# Patient Record
Sex: Male | Born: 1935 | Race: White | Hispanic: No | Marital: Married | Smoking: Never smoker
Health system: Southern US, Community
[De-identification: ages and names within clinical notes are randomized; demographics above are authoritative.]

## PROBLEM LIST (undated history)

## (undated) DIAGNOSIS — K900 Celiac disease: Secondary | ICD-10-CM

## (undated) DIAGNOSIS — S62102A Fracture of unspecified carpal bone, left wrist, initial encounter for closed fracture: Secondary | ICD-10-CM

## (undated) DIAGNOSIS — G4730 Sleep apnea, unspecified: Secondary | ICD-10-CM

## (undated) DIAGNOSIS — K317 Polyp of stomach and duodenum: Secondary | ICD-10-CM

## (undated) DIAGNOSIS — Z8701 Personal history of pneumonia (recurrent): Secondary | ICD-10-CM

## (undated) DIAGNOSIS — I4891 Unspecified atrial fibrillation: Secondary | ICD-10-CM

## (undated) DIAGNOSIS — E039 Hypothyroidism, unspecified: Secondary | ICD-10-CM

## (undated) DIAGNOSIS — J45909 Unspecified asthma, uncomplicated: Secondary | ICD-10-CM

## (undated) DIAGNOSIS — G4752 REM sleep behavior disorder: Secondary | ICD-10-CM

## (undated) DIAGNOSIS — E119 Type 2 diabetes mellitus without complications: Secondary | ICD-10-CM

## (undated) DIAGNOSIS — I10 Essential (primary) hypertension: Secondary | ICD-10-CM

## (undated) DIAGNOSIS — K5790 Diverticulosis of intestine, part unspecified, without perforation or abscess without bleeding: Secondary | ICD-10-CM

## (undated) DIAGNOSIS — G629 Polyneuropathy, unspecified: Secondary | ICD-10-CM

## (undated) DIAGNOSIS — N189 Chronic kidney disease, unspecified: Secondary | ICD-10-CM

## (undated) DIAGNOSIS — G2581 Restless legs syndrome: Secondary | ICD-10-CM

## (undated) DIAGNOSIS — C61 Malignant neoplasm of prostate: Secondary | ICD-10-CM

## (undated) DIAGNOSIS — I639 Cerebral infarction, unspecified: Secondary | ICD-10-CM

## (undated) DIAGNOSIS — I350 Nonrheumatic aortic (valve) stenosis: Secondary | ICD-10-CM

## (undated) DIAGNOSIS — C439 Malignant melanoma of skin, unspecified: Secondary | ICD-10-CM

## (undated) DIAGNOSIS — K219 Gastro-esophageal reflux disease without esophagitis: Secondary | ICD-10-CM

## (undated) DIAGNOSIS — R251 Tremor, unspecified: Secondary | ICD-10-CM

## (undated) DIAGNOSIS — L0390 Cellulitis, unspecified: Secondary | ICD-10-CM

## (undated) DIAGNOSIS — I480 Paroxysmal atrial fibrillation: Secondary | ICD-10-CM

## (undated) DIAGNOSIS — D509 Iron deficiency anemia, unspecified: Secondary | ICD-10-CM

## (undated) DIAGNOSIS — R252 Cramp and spasm: Secondary | ICD-10-CM

## (undated) HISTORY — DX: Type 2 diabetes mellitus without complications: E11.9

## (undated) HISTORY — DX: Hypothyroidism, unspecified: E03.9

## (undated) HISTORY — DX: Diverticulosis of intestine, part unspecified, without perforation or abscess without bleeding: K57.90

## (undated) HISTORY — DX: Polyneuropathy, unspecified: G62.9

## (undated) HISTORY — DX: REM sleep behavior disorder: G47.52

## (undated) HISTORY — PX: CARPAL TUNNEL RELEASE: SHX101

## (undated) HISTORY — DX: Unspecified atrial fibrillation: I48.91

## (undated) HISTORY — DX: Celiac disease: K90.0

## (undated) HISTORY — PX: KNEE SURGERY: SHX244

## (undated) HISTORY — DX: Cerebral infarction, unspecified: I63.9

## (undated) HISTORY — DX: Malignant melanoma of skin, unspecified: C43.9

## (undated) HISTORY — DX: Fracture of unspecified carpal bone, left wrist, initial encounter for closed fracture: S62.102A

## (undated) HISTORY — DX: Iron deficiency anemia, unspecified: D50.9

## (undated) HISTORY — PX: FOOT SURGERY: SHX648

## (undated) HISTORY — DX: Restless legs syndrome: G25.81

## (undated) HISTORY — DX: Sleep apnea, unspecified: G47.30

## (undated) HISTORY — DX: Personal history of pneumonia (recurrent): Z87.01

## (undated) HISTORY — DX: Tremor, unspecified: R25.1

## (undated) HISTORY — DX: Polyp of stomach and duodenum: K31.7

## (undated) HISTORY — DX: Paroxysmal atrial fibrillation: I48.0

## (undated) HISTORY — DX: Nonrheumatic aortic (valve) stenosis: I35.0

## (undated) HISTORY — DX: Essential (primary) hypertension: I10

## (undated) HISTORY — DX: Cramp and spasm: R25.2

## (undated) HISTORY — DX: Malignant neoplasm of prostate: C61

## (undated) HISTORY — DX: Gastro-esophageal reflux disease without esophagitis: K21.9

## (undated) HISTORY — DX: Cellulitis, unspecified: L03.90

## (undated) HISTORY — DX: Chronic kidney disease, unspecified: N18.9

## (undated) HISTORY — DX: Unspecified asthma, uncomplicated: J45.909

---

## 1976-10-13 HISTORY — PX: APPENDECTOMY: SHX54

## 1985-10-13 DIAGNOSIS — S62102A Fracture of unspecified carpal bone, left wrist, initial encounter for closed fracture: Secondary | ICD-10-CM

## 1985-10-13 HISTORY — DX: Fracture of unspecified carpal bone, left wrist, initial encounter for closed fracture: S62.102A

## 1992-10-13 HISTORY — PX: HERNIA REPAIR: SHX51

## 1998-03-13 ENCOUNTER — Encounter: Payer: Self-pay | Admitting: Internal Medicine

## 1998-03-13 ENCOUNTER — Ambulatory Visit (HOSPITAL_COMMUNITY): Admission: RE | Admit: 1998-03-13 | Discharge: 1998-03-13 | Payer: Self-pay | Admitting: Gastroenterology

## 1999-09-20 ENCOUNTER — Encounter: Payer: Self-pay | Admitting: Family Medicine

## 1999-09-20 ENCOUNTER — Encounter: Admission: RE | Admit: 1999-09-20 | Discharge: 1999-09-20 | Payer: Self-pay | Admitting: Family Medicine

## 1999-10-14 HISTORY — PX: CHOLECYSTECTOMY: SHX55

## 1999-10-14 HISTORY — PX: LUNG SURGERY: SHX703

## 1999-11-19 ENCOUNTER — Encounter (INDEPENDENT_AMBULATORY_CARE_PROVIDER_SITE_OTHER): Payer: Self-pay

## 1999-11-19 ENCOUNTER — Encounter: Payer: Self-pay | Admitting: Pulmonary Disease

## 1999-11-19 ENCOUNTER — Ambulatory Visit (HOSPITAL_COMMUNITY): Admission: RE | Admit: 1999-11-19 | Discharge: 1999-11-19 | Payer: Self-pay | Admitting: Pulmonary Disease

## 2000-01-16 ENCOUNTER — Encounter: Payer: Self-pay | Admitting: Thoracic Surgery

## 2000-01-17 ENCOUNTER — Encounter: Payer: Self-pay | Admitting: Thoracic Surgery

## 2000-01-17 ENCOUNTER — Inpatient Hospital Stay: Admission: RE | Admit: 2000-01-17 | Discharge: 2000-01-23 | Payer: Self-pay | Admitting: Thoracic Surgery

## 2000-01-17 ENCOUNTER — Encounter (INDEPENDENT_AMBULATORY_CARE_PROVIDER_SITE_OTHER): Payer: Self-pay | Admitting: Specialist

## 2000-01-18 ENCOUNTER — Encounter: Payer: Self-pay | Admitting: Thoracic Surgery

## 2000-01-19 ENCOUNTER — Encounter: Payer: Self-pay | Admitting: Thoracic Surgery

## 2000-01-20 ENCOUNTER — Encounter: Payer: Self-pay | Admitting: Thoracic Surgery

## 2000-01-21 ENCOUNTER — Encounter: Payer: Self-pay | Admitting: Thoracic Surgery

## 2000-01-22 ENCOUNTER — Encounter: Payer: Self-pay | Admitting: Thoracic Surgery

## 2000-02-05 ENCOUNTER — Encounter: Payer: Self-pay | Admitting: Thoracic Surgery

## 2000-02-05 ENCOUNTER — Encounter: Admission: RE | Admit: 2000-02-05 | Discharge: 2000-02-05 | Payer: Self-pay | Admitting: Thoracic Surgery

## 2000-03-11 ENCOUNTER — Encounter: Admission: RE | Admit: 2000-03-11 | Discharge: 2000-03-11 | Payer: Self-pay | Admitting: Thoracic Surgery

## 2000-03-11 ENCOUNTER — Encounter: Payer: Self-pay | Admitting: Thoracic Surgery

## 2000-05-12 ENCOUNTER — Encounter: Payer: Self-pay | Admitting: Thoracic Surgery

## 2000-05-12 ENCOUNTER — Encounter: Admission: RE | Admit: 2000-05-12 | Discharge: 2000-05-12 | Payer: Self-pay | Admitting: Thoracic Surgery

## 2000-07-15 LAB — PULMONARY FUNCTION TEST

## 2000-07-17 ENCOUNTER — Ambulatory Visit (HOSPITAL_COMMUNITY): Admission: RE | Admit: 2000-07-17 | Discharge: 2000-07-17 | Payer: Self-pay | Admitting: Pulmonary Disease

## 2000-07-17 ENCOUNTER — Encounter: Payer: Self-pay | Admitting: Pulmonary Disease

## 2000-07-22 ENCOUNTER — Encounter: Payer: Self-pay | Admitting: Thoracic Surgery

## 2000-07-22 ENCOUNTER — Encounter: Admission: RE | Admit: 2000-07-22 | Discharge: 2000-07-22 | Payer: Self-pay | Admitting: Thoracic Surgery

## 2000-10-13 HISTORY — PX: PROSTATECTOMY: SHX69

## 2000-11-19 ENCOUNTER — Ambulatory Visit (HOSPITAL_BASED_OUTPATIENT_CLINIC_OR_DEPARTMENT_OTHER): Admission: RE | Admit: 2000-11-19 | Discharge: 2000-11-19 | Payer: Self-pay | Admitting: Pulmonary Disease

## 2000-11-19 ENCOUNTER — Ambulatory Visit: Admission: RE | Admit: 2000-11-19 | Discharge: 2000-11-19 | Payer: Self-pay | Admitting: Pulmonary Disease

## 2000-11-19 LAB — PULMONARY FUNCTION TEST

## 2000-11-26 ENCOUNTER — Ambulatory Visit (HOSPITAL_COMMUNITY): Admission: RE | Admit: 2000-11-26 | Discharge: 2000-11-26 | Payer: Self-pay | Admitting: Pulmonary Disease

## 2000-11-26 ENCOUNTER — Encounter: Payer: Self-pay | Admitting: Pulmonary Disease

## 2001-04-29 ENCOUNTER — Other Ambulatory Visit: Admission: RE | Admit: 2001-04-29 | Discharge: 2001-04-29 | Payer: Self-pay | Admitting: Urology

## 2001-04-29 ENCOUNTER — Encounter (INDEPENDENT_AMBULATORY_CARE_PROVIDER_SITE_OTHER): Payer: Self-pay | Admitting: Specialist

## 2001-05-13 ENCOUNTER — Encounter: Payer: Self-pay | Admitting: Urology

## 2001-05-13 ENCOUNTER — Encounter: Admission: RE | Admit: 2001-05-13 | Discharge: 2001-05-13 | Payer: Self-pay | Admitting: Urology

## 2001-05-18 ENCOUNTER — Ambulatory Visit: Admission: RE | Admit: 2001-05-18 | Discharge: 2001-08-16 | Payer: Self-pay | Admitting: Radiation Oncology

## 2001-06-15 ENCOUNTER — Encounter: Payer: Self-pay | Admitting: Urology

## 2001-06-21 ENCOUNTER — Inpatient Hospital Stay (HOSPITAL_COMMUNITY): Admission: RE | Admit: 2001-06-21 | Discharge: 2001-06-24 | Payer: Self-pay | Admitting: Urology

## 2001-06-21 ENCOUNTER — Encounter (INDEPENDENT_AMBULATORY_CARE_PROVIDER_SITE_OTHER): Payer: Self-pay | Admitting: Specialist

## 2001-06-28 ENCOUNTER — Ambulatory Visit (HOSPITAL_COMMUNITY): Admission: RE | Admit: 2001-06-28 | Discharge: 2001-06-28 | Payer: Self-pay | Admitting: Urology

## 2001-08-23 ENCOUNTER — Ambulatory Visit (HOSPITAL_COMMUNITY): Admission: RE | Admit: 2001-08-23 | Discharge: 2001-08-23 | Payer: Self-pay | Admitting: Pulmonary Disease

## 2001-08-23 ENCOUNTER — Encounter: Payer: Self-pay | Admitting: Pulmonary Disease

## 2001-08-30 ENCOUNTER — Ambulatory Visit (HOSPITAL_BASED_OUTPATIENT_CLINIC_OR_DEPARTMENT_OTHER): Admission: RE | Admit: 2001-08-30 | Discharge: 2001-08-30 | Payer: Self-pay | Admitting: Urology

## 2001-09-21 ENCOUNTER — Ambulatory Visit (HOSPITAL_COMMUNITY): Admission: RE | Admit: 2001-09-21 | Discharge: 2001-09-21 | Payer: Self-pay | Admitting: Family Medicine

## 2002-04-26 ENCOUNTER — Encounter: Admission: RE | Admit: 2002-04-26 | Discharge: 2002-07-25 | Payer: Self-pay | Admitting: Family Medicine

## 2002-11-24 ENCOUNTER — Encounter: Payer: Self-pay | Admitting: Pulmonary Disease

## 2002-11-24 ENCOUNTER — Ambulatory Visit (HOSPITAL_COMMUNITY): Admission: RE | Admit: 2002-11-24 | Discharge: 2002-11-24 | Payer: Self-pay | Admitting: Pulmonary Disease

## 2002-12-28 ENCOUNTER — Encounter: Payer: Self-pay | Admitting: Family Medicine

## 2002-12-28 ENCOUNTER — Encounter: Admission: RE | Admit: 2002-12-28 | Discharge: 2002-12-28 | Payer: Self-pay | Admitting: Family Medicine

## 2003-05-12 ENCOUNTER — Encounter: Payer: Self-pay | Admitting: Family Medicine

## 2003-05-12 ENCOUNTER — Encounter: Admission: RE | Admit: 2003-05-12 | Discharge: 2003-05-12 | Payer: Self-pay | Admitting: Family Medicine

## 2003-07-10 ENCOUNTER — Ambulatory Visit: Admission: RE | Admit: 2003-07-10 | Discharge: 2003-07-10 | Payer: Self-pay | Admitting: Anesthesiology

## 2003-11-14 ENCOUNTER — Ambulatory Visit (HOSPITAL_COMMUNITY): Admission: RE | Admit: 2003-11-14 | Discharge: 2003-11-14 | Payer: Self-pay | Admitting: Pulmonary Disease

## 2004-07-15 LAB — PULMONARY FUNCTION TEST

## 2004-08-19 ENCOUNTER — Ambulatory Visit (HOSPITAL_COMMUNITY): Admission: RE | Admit: 2004-08-19 | Discharge: 2004-08-19 | Payer: Self-pay | Admitting: Orthopedic Surgery

## 2004-11-22 ENCOUNTER — Ambulatory Visit: Payer: Self-pay | Admitting: Pulmonary Disease

## 2005-05-26 ENCOUNTER — Ambulatory Visit: Payer: Self-pay | Admitting: Pulmonary Disease

## 2005-09-19 ENCOUNTER — Ambulatory Visit: Payer: Self-pay | Admitting: Pulmonary Disease

## 2006-03-16 ENCOUNTER — Ambulatory Visit: Payer: Self-pay | Admitting: Pulmonary Disease

## 2006-05-14 ENCOUNTER — Ambulatory Visit: Payer: Self-pay | Admitting: Pulmonary Disease

## 2006-09-09 ENCOUNTER — Ambulatory Visit: Payer: Self-pay | Admitting: Internal Medicine

## 2006-09-23 ENCOUNTER — Encounter: Payer: Self-pay | Admitting: Internal Medicine

## 2006-09-23 ENCOUNTER — Ambulatory Visit (HOSPITAL_COMMUNITY): Admission: RE | Admit: 2006-09-23 | Discharge: 2006-09-23 | Payer: Self-pay | Admitting: Internal Medicine

## 2006-10-01 ENCOUNTER — Ambulatory Visit: Payer: Self-pay | Admitting: Internal Medicine

## 2006-10-12 ENCOUNTER — Ambulatory Visit: Payer: Self-pay | Admitting: Internal Medicine

## 2006-12-17 ENCOUNTER — Ambulatory Visit: Payer: Self-pay | Admitting: Pulmonary Disease

## 2006-12-25 ENCOUNTER — Ambulatory Visit: Admission: RE | Admit: 2006-12-25 | Discharge: 2006-12-25 | Payer: Self-pay | Admitting: Pulmonary Disease

## 2006-12-25 ENCOUNTER — Ambulatory Visit: Payer: Self-pay | Admitting: Pulmonary Disease

## 2007-01-14 ENCOUNTER — Ambulatory Visit: Payer: Self-pay | Admitting: Pulmonary Disease

## 2007-02-04 ENCOUNTER — Ambulatory Visit: Payer: Self-pay | Admitting: Pulmonary Disease

## 2007-02-18 ENCOUNTER — Ambulatory Visit: Payer: Self-pay | Admitting: Pulmonary Disease

## 2007-05-17 ENCOUNTER — Ambulatory Visit: Payer: Self-pay | Admitting: Internal Medicine

## 2007-05-17 LAB — CONVERTED CEMR LAB
Albumin: 3.7 g/dL (ref 3.5–5.2)
Basophils Absolute: 0 10*3/uL (ref 0.0–0.1)
Eosinophils Absolute: 0.3 10*3/uL (ref 0.0–0.6)
Ferritin: 13.9 ng/mL — ABNORMAL LOW (ref 22.0–322.0)
Folate: >20 ng/mL
Iron: 103 ug/dL (ref 42–165)
MCHC: 34.6 g/dL (ref 30.0–36.0)
MCV: 95.3 fL (ref 78.0–100.0)
Platelets: 278 10*3/uL (ref 150–400)
RBC: 3.8 M/uL — ABNORMAL LOW (ref 4.22–5.81)
Saturation Ratios: 27.8 % (ref 20.0–50.0)
Tissue Transglutaminase Ab, IgA: 55 units — ABNORMAL HIGH (ref <7)
Transferrin: 264.5 mg/dL (ref >=212.0)
Vitamin B-12: 505 pg/mL (ref 211–911)

## 2007-08-31 ENCOUNTER — Ambulatory Visit: Payer: Self-pay | Admitting: Internal Medicine

## 2007-09-07 ENCOUNTER — Encounter: Payer: Self-pay | Admitting: Internal Medicine

## 2007-09-07 ENCOUNTER — Ambulatory Visit: Payer: Self-pay | Admitting: Internal Medicine

## 2007-11-29 DIAGNOSIS — K219 Gastro-esophageal reflux disease without esophagitis: Secondary | ICD-10-CM | POA: Insufficient documentation

## 2007-11-29 DIAGNOSIS — K900 Celiac disease: Secondary | ICD-10-CM

## 2008-03-14 ENCOUNTER — Encounter: Payer: Self-pay | Admitting: Internal Medicine

## 2008-03-24 ENCOUNTER — Ambulatory Visit: Payer: Self-pay | Admitting: Internal Medicine

## 2008-04-04 ENCOUNTER — Encounter: Payer: Self-pay | Admitting: Internal Medicine

## 2008-04-27 ENCOUNTER — Encounter: Payer: Self-pay | Admitting: Internal Medicine

## 2008-04-27 ENCOUNTER — Ambulatory Visit: Payer: Self-pay | Admitting: Internal Medicine

## 2008-04-27 HISTORY — PX: COLONOSCOPY W/ BIOPSIES: SHX1374

## 2008-04-27 HISTORY — PX: UPPER GASTROINTESTINAL ENDOSCOPY: SHX188

## 2008-05-04 ENCOUNTER — Encounter: Payer: Self-pay | Admitting: Internal Medicine

## 2008-11-13 ENCOUNTER — Encounter: Payer: Self-pay | Admitting: Internal Medicine

## 2009-01-30 ENCOUNTER — Inpatient Hospital Stay (HOSPITAL_COMMUNITY): Admission: EM | Admit: 2009-01-30 | Discharge: 2009-02-02 | Payer: Self-pay | Admitting: Emergency Medicine

## 2009-02-09 ENCOUNTER — Ambulatory Visit (HOSPITAL_COMMUNITY): Admission: RE | Admit: 2009-02-09 | Discharge: 2009-02-09 | Payer: Self-pay | Admitting: Internal Medicine

## 2009-02-28 ENCOUNTER — Encounter (HOSPITAL_COMMUNITY): Admission: RE | Admit: 2009-02-28 | Discharge: 2009-03-30 | Payer: Self-pay | Admitting: Internal Medicine

## 2009-02-28 ENCOUNTER — Ambulatory Visit (HOSPITAL_COMMUNITY): Payer: Self-pay | Admitting: Internal Medicine

## 2009-06-01 ENCOUNTER — Encounter: Payer: Self-pay | Admitting: Internal Medicine

## 2009-09-20 ENCOUNTER — Ambulatory Visit: Payer: Self-pay | Admitting: Internal Medicine

## 2009-09-20 DIAGNOSIS — E669 Obesity, unspecified: Secondary | ICD-10-CM

## 2009-09-24 LAB — CONVERTED CEMR LAB: Tissue Transglutaminase Ab, IgA: 5.6 units (ref <7)

## 2009-12-13 ENCOUNTER — Encounter: Payer: Self-pay | Admitting: Internal Medicine

## 2009-12-13 ENCOUNTER — Ambulatory Visit: Payer: Self-pay | Admitting: Internal Medicine

## 2009-12-21 ENCOUNTER — Encounter: Payer: Self-pay | Admitting: Internal Medicine

## 2010-01-10 ENCOUNTER — Telehealth: Payer: Self-pay | Admitting: Internal Medicine

## 2010-02-15 ENCOUNTER — Encounter: Payer: Self-pay | Admitting: Internal Medicine

## 2010-04-29 ENCOUNTER — Telehealth: Payer: Self-pay | Admitting: Internal Medicine

## 2010-05-08 ENCOUNTER — Ambulatory Visit: Payer: Self-pay | Admitting: Internal Medicine

## 2010-05-08 LAB — CONVERTED CEMR LAB
ALT: 29 units/L (ref 0–53)
Basophils Absolute: 0 10*3/uL (ref 0.0–0.1)
CO2: 29 meq/L (ref 19–32)
Creatinine, Ser: 1 mg/dL (ref 0.4–1.5)
GFR calc non Af Amer: 74.86 mL/min (ref >60)
HCT: 34.8 % — ABNORMAL LOW (ref 39.0–52.0)
Hemoglobin: 12 g/dL — ABNORMAL LOW (ref 13.0–17.0)
Lymphs Abs: 1.5 10*3/uL (ref 0.7–4.0)
MCHC: 34.5 g/dL (ref 30.0–36.0)
Monocytes Relative: 11.9 % (ref 3.0–12.0)
Neutro Abs: 6.2 10*3/uL (ref 1.4–7.7)
RDW: 13.7 % (ref 11.5–14.6)
Total Bilirubin: 0.7 mg/dL (ref 0.3–1.2)

## 2010-07-12 ENCOUNTER — Encounter: Payer: Self-pay | Admitting: Internal Medicine

## 2010-08-13 ENCOUNTER — Ambulatory Visit: Payer: Self-pay | Admitting: Internal Medicine

## 2010-08-13 LAB — CONVERTED CEMR LAB: Tissue Transglutaminase Ab, IgA: 31.2 units — ABNORMAL HIGH (ref <20)

## 2010-09-12 ENCOUNTER — Ambulatory Visit: Payer: Self-pay | Admitting: Internal Medicine

## 2010-09-12 LAB — CONVERTED CEMR LAB: Tissue Transglutaminase Ab, IgA: 32.3 units — ABNORMAL HIGH (ref <20)

## 2010-09-15 ENCOUNTER — Telehealth: Payer: Self-pay | Admitting: Internal Medicine

## 2010-11-12 NOTE — Progress Notes (Signed)
Summary: celiac abs remain +   Phone Note Outgoing Call   Summary of Call: celiac antibodies remain positive unless he is having problems, would not do anything different at this point to the best of my knowledge this means he is ingesting gluten or has refractory disease keep annual f/u let's make sure he is not having any GI sxs cc the note and labs to his PCP Dr. Siri Cole MD, [facility]  September 15, 2010 1:18 PM   Follow-up for Phone Call        LM to [facility] at home number [place] Deborra Medina)  September 17, 2010 10:32 AM   RC from pt at 11:40am-left message on VM.  RC to pt.  I advised him of the above and he is agreeable with that plan.  I told him we would contact him in the summer to come in for yearly follow up.  Pt voices understanding and is agreeable. Follow-up by: Abelino Derrick CMA Deborra Medina),  September 19, 2010 12:39 PM

## 2010-11-12 NOTE — Progress Notes (Signed)
Summary: macrocytosis  Phone Note Outgoing Call   Summary of Call: please contact patient MCV 102 on latest CBC at Dr. Juel Burrow office could be a sign of B12 deficiency last B12 level here ok in 2009 if he has not had one in last 6 months would repeat it - he may need to check with Dr. Nevada Crane - cc Dr. Wende Neighbors also Gatha Mayer MD, [facility]  January 10, 2010 6:11 PM   Follow-up for Phone Call        [facility] from pt.  He does not think he has a B12 level lately.  We will call Dr. Juel Burrow office to see if they have drawn in last 6 months. I spoke with Thayer Headings and she will check in Mr. Ronchetti chart and return my call.  Follow-up by: Abelino Derrick CMA Deborra Medina),  January 23, 2010 9:15 AM  Additional Follow-up for Phone Call Additional follow up Details #1::        Left message for patient to call back, patient will need b12 level Barb Merino RN, [facility]  January 24, 2010 11:40 AM  RC from pt.  Notified that he does need B12 level.  Order faxed to [facility] @ Dr. Juel Burrow office.  Pt will call his office to schedule for B12 level. Additional Follow-up by: Abelino Derrick CMA Deborra Medina),  January 30, 2010 9:55 AM  New Problems: MACROCYTIC ANEMIA (ICD-281.9)   New Problems: MACROCYTIC ANEMIA (ICD-281.9)

## 2010-11-12 NOTE — Assessment & Plan Note (Signed)
Summary: pale stools/sheri    History of Present Illness Visit Type: Follow-up Visit Primary GI MD: Silvano Rusk MD [facility] Primary Provider: Wende Neighbors, MD Requesting Provider: n/a Chief Complaint: Celiac disease, pale stools x 6 weeks History of Present Illness:   When stools are formed they are grayish white. When loose it is brown and he is having more loose stools now. He had terrible constipation a few months ago, Dr. Nevada Crane ?ed due to oxybutinin and constipation improved after stopping that. Urinary leakage returned. Change in stool color started 6 weeks ago. He has not changed medications, supplements or diet. No barium ingestion.  Says "doing pretty good" with gluten-free. Blood sugars have been fluctuationg with highest at 150, lowest in 70's. He lost 11# with intent.   GI Review of Systems    Reports bloating.      Denies abdominal pain, acid reflux, belching, chest pain, dysphagia with liquids, dysphagia with solids, heartburn, loss of appetite, nausea, vomiting, vomiting blood, weight loss, and  weight gain.      Reports change in bowel habits, constipation, diarrhea, and  light color stool.     Denies anal fissure, black tarry stools, diverticulosis, fecal incontinence, heme positive stool, hemorrhoids, irritable bowel syndrome, jaundice, liver problems, rectal bleeding, and  rectal pain.    Current Medications (verified): 1)  Aspirin Adult Low Strength 81 Mg  Tbec (Aspirin) .... Take 1 Tablet By Mouth Once A Day 2)  Multivitamins   Tabs (Multiple Vitamin) .... Take 1 Tablet By Mouth Once A Day 3)  Amitriptyline Hcl 75 Mg Tabs (Amitriptyline Hcl) .Marland Kitchen.. 1 By Mouth Once Daily 4)  Synthroid 125 Mcg Tabs (Levothyroxine Sodium) .Marland Kitchen.. 1 By Mouth Once Daily 5)  Benicar 20 Mg Tabs (Olmesartan Medoxomil) .... Take 1/2 Tablet By Mouth Once Daily 6)  Metoprolol Tartrate 50 Mg Tabs (Metoprolol Tartrate) .Marland Kitchen.. 1 By Mouth Once Daily 7)  Oxybutynin Chloride 15 Mg Xr24h-Tab (Oxybutynin  Chloride) .Marland Kitchen.. 1 By Mouth Once Daily 8)  Niaspan 1000 Mg Cr-Tabs (Niacin (Antihyperlipidemic)) .Marland Kitchen.. 1 By Mouth Once Daily 9)  Divista 1 Mg Caps (Fa-B6-B12-Omega 3-Biotin-Cr) .Marland Kitchen.. 1 By Mouth Once Daily 10)  Alpha-Lipoic Acid 300 Mg Caps (Alpha-Lipoic Acid) .Marland Kitchen.. 1 By Mouth Once Daily 11)  Vitamin D3 1000 Unit Caps (Cholecalciferol) .Marland Kitchen.. 1 By Mouth Two Times A Day 12)  Melatonin 3 Mg Tabs (Melatonin) .Marland Kitchen.. 1 By Mouth Once Daily 13)  Coq10 200 Mg Caps (Coenzyme Q10) .Marland Kitchen.. 1 By Mouth Once Daily 14)  Magnesium 500 Mg Tabs (Magnesium) .Marland Kitchen.. 1 By Mouth Once Daily 15)  Oscal 500/200 D-3 500-200 Mg-Unit Tabs (Calcium-Vitamin D) .... Three Times A Day 16)  Vitamin C 1000 Mg Tabs (Ascorbic Acid) .... Two Times A Day  Allergies (verified): 1)  ! Keflex 2)  Sulfa 3)  Pcn 4)  Novocain  Past History:  Past Medical History: Reviewed history from 09/20/2009 and no changes required. Celiac disease Iron deficiency anemia, recurrent GERD REM Sleep Behavior Disorder Hypothyroidism Restless leg syndrome Pneumonia with pleural effusion Asthmatic bronchitis Melanoma left UE Cellulitis ([facility] hospitalization)  Past Surgical History: Reviewed history from 04/27/2008 and no changes required. Appendectomy Cholecystectomy Prostatectomy for prostate cancer foot surgery bilateral hernia repair lung surgery knee surgery x 2 carpal tunnel  Family History: Reviewed history from 09/20/2009 and no changes required. Family History of Breast Cancer: Mother No FH of Colon Cancer:  Social History: Reviewed history from 03/24/2008 and no changes required. 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  Alcohol Use - no Illicit Drug Use - yes Patient gets regular exercise.  Vital Signs:  Patient profile:   77 year old male Height:      70 inches Weight:      224.38 pounds BMI:     32.31 Pulse rate:   68 / minute Pulse rhythm:   regular BP sitting:   142 / 60  (left arm) Cuff  size:   regular  Vitals Entered By: June McMurray [place] Deborra Medina) (May 08, 2010 9:53 AM)  Physical Exam  General:  obese.  NAD Eyes:  anicteric Abdomen:  obese, soft and nontender ventral hernia midline  Rectal:  no mass soft and tan-yellow colered stool   Impression & Recommendations:  Problem # 1:  CELIAC DISEASE (ICD-579.0) Assessment Unchanged  Orders: T-Sprue Panel (Celiac Disease Aby Eval) (83516x3/[address]) TLB-CBC Platelet - w/Differential (85025-CBCD) TLB-CMP (Comprehensive Metabolic Pnl) (0000000)  Problem # 2:  NONSPECIFIC ABNORMAL FINDING IN STOOL CONTENTS (ICD-792.1) change in color, etiology not clear at all and no worrisome features ? related to celiac disease start with labs not sure what next step would be if they are unhelpful and explained to him he is concerned about malignancy  Patient Instructions: 1)  Please go to the basement to have your lab tests drawn today.  2)  We will call you with further follow up after reviewing these results.  3)  Copy sent to : Wende Neighbors, MD 4)  The medication list was reviewed and reconciled.  All changed / newly prescribed medications were explained.  A complete medication list was provided to the patient / caregiver.

## 2010-11-12 NOTE — Progress Notes (Signed)
Summary: White stool.   Phone Note Call from Patient Call back at Home Phone [phone]   Caller: Spouse Call For: [name] Summary of Call: Patient is having white stool for about a month he is also complaining of some bloatingbut denies any other symptoms would like a sooner appt than september. Initial call taken by: Bernita Buffy CMA ([place]),  April 29, 2010 9:15 AM  Follow-up for Phone Call        1 month hx of pale/white stools.  patient reports started after he stopped taking his pain meds.  he does report some gas/bloating.  Specifically denies fever, abdominal pain, nausea or vomiting, diarrhea.  Patient  is given an appointment for 05/08/10 9:45.  Dr [name] does he need any lab work prior to this appointment?? Follow-up by: Barb Merino RN, [place],  April 29, 2010 9:27 AM  Additional Follow-up for Phone Call Additional follow up Details #1::        no - has he been using aluminum hydroxide antacids, antibiotics, had barium? Additional Follow-up by: Gatha Mayer MD, [facility],  April 29, 2010 5:07 PM    Additional Follow-up for Phone Call Additional follow up Details #2::    no antacids of any kind or recent barium studies.  he will keep his appointment for next week. Follow-up by: Barb Merino RN, [place],  April 30, 2010 10:52 AM

## 2010-11-12 NOTE — Miscellaneous (Signed)
Summary: BONE DENSITY  Clinical Lists Changes  Orders: Added new Test order of T-Bone Densitometry (77080) - Signed Added new Test order of T-Lumbar Vertebral Assessment (77082) - Signed 

## 2011-01-21 LAB — CBC
MCV: 96.4 fL (ref 78.0–100.0)
RBC: 3.35 MIL/uL — ABNORMAL LOW (ref 4.22–5.81)
WBC: 6.8 10*3/uL (ref 4.0–10.5)

## 2011-01-21 LAB — LIPID PANEL
HDL: 23 mg/dL — ABNORMAL LOW (ref >39)
Triglycerides: 296 mg/dL — ABNORMAL HIGH (ref <150)
VLDL: 59 mg/dL — ABNORMAL HIGH (ref 0–40)

## 2011-01-21 LAB — COMPREHENSIVE METABOLIC PANEL
ALT: 21 U/L (ref 0–53)
AST: 24 U/L (ref 0–37)
CO2: 26 mEq/L (ref 19–32)
Chloride: 107 mEq/L (ref 96–112)
GFR calc Af Amer: >60 mL/min (ref >60)
GFR calc non Af Amer: >60 mL/min (ref >60)
Potassium: 4.1 mEq/L (ref 3.5–5.1)
Sodium: 138 mEq/L (ref 135–145)
Total Bilirubin: 0.4 mg/dL (ref 0.3–1.2)

## 2011-01-22 LAB — GLUCOSE, CAPILLARY
Glucose-Capillary: 102 mg/dL — ABNORMAL HIGH (ref 70–99)
Glucose-Capillary: 111 mg/dL — ABNORMAL HIGH (ref 70–99)
Glucose-Capillary: 118 mg/dL — ABNORMAL HIGH (ref 70–99)
Glucose-Capillary: 152 mg/dL — ABNORMAL HIGH (ref 70–99)
Glucose-Capillary: 72 mg/dL (ref 70–99)
Glucose-Capillary: 90 mg/dL (ref 70–99)
Glucose-Capillary: 91 mg/dL (ref 70–99)
Glucose-Capillary: 99 mg/dL (ref 70–99)

## 2011-01-22 LAB — BASIC METABOLIC PANEL
BUN: 13 mg/dL (ref 6–23)
BUN: 17 mg/dL (ref 6–23)
CO2: 22 mEq/L (ref 19–32)
CO2: 24 mEq/L (ref 19–32)
Calcium: 8.7 mg/dL (ref 8.4–10.5)
Calcium: 9.3 mg/dL (ref 8.4–10.5)
Chloride: 113 mEq/L — ABNORMAL HIGH (ref 96–112)
Creatinine, Ser: 0.98 mg/dL (ref 0.4–1.5)
Creatinine, Ser: 1.06 mg/dL (ref 0.4–1.5)
GFR calc Af Amer: >60 mL/min (ref >60)
GFR calc Af Amer: >60 mL/min (ref >60)
GFR calc Af Amer: >60 mL/min (ref >60)
GFR calc non Af Amer: >60 mL/min (ref >60)
GFR calc non Af Amer: >60 mL/min (ref >60)
GFR calc non Af Amer: >60 mL/min (ref >60)
Glucose, Bld: 117 mg/dL — ABNORMAL HIGH (ref 70–99)
Potassium: 4 mEq/L (ref 3.5–5.1)
Potassium: 4.1 mEq/L (ref 3.5–5.1)
Sodium: 139 mEq/L (ref 135–145)
Sodium: 139 mEq/L (ref 135–145)

## 2011-01-22 LAB — DIFFERENTIAL
Basophils Absolute: 0 10*3/uL (ref 0.0–0.1)
Basophils Relative: 0 % (ref 0–1)
Basophils Relative: 1 % (ref 0–1)
Eosinophils Absolute: 0.2 10*3/uL (ref 0.0–0.7)
Eosinophils Relative: 2 % (ref 0–5)
Lymphs Abs: 1.6 10*3/uL (ref 0.7–4.0)
Monocytes Absolute: 1.2 10*3/uL — ABNORMAL HIGH (ref 0.1–1.0)
Monocytes Relative: 11 % (ref 3–12)
Monocytes Relative: 9 % (ref 3–12)
Neutro Abs: 10.9 10*3/uL — ABNORMAL HIGH (ref 1.7–7.7)
Neutro Abs: 7.9 10*3/uL — ABNORMAL HIGH (ref 1.7–7.7)
Neutrophils Relative %: 73 % (ref 43–77)
Neutrophils Relative %: 78 % — ABNORMAL HIGH (ref 43–77)

## 2011-01-22 LAB — CBC
Hemoglobin: 11.3 g/dL — ABNORMAL LOW (ref 13.0–17.0)
MCHC: 34.6 g/dL (ref 30.0–36.0)
MCHC: 34.7 g/dL (ref 30.0–36.0)
Platelets: 282 10*3/uL (ref 150–400)
Platelets: 324 10*3/uL (ref 150–400)
RBC: 3.67 MIL/uL — ABNORMAL LOW (ref 4.22–5.81)
RDW: 13.2 % (ref 11.5–15.5)
WBC: 13.9 10*3/uL — ABNORMAL HIGH (ref 4.0–10.5)

## 2011-01-22 LAB — VANCOMYCIN, TROUGH: Vancomycin Tr: 14.9 ug/mL (ref 10.0–20.0)

## 2011-02-25 NOTE — Discharge Summary (Signed)
[name], [name]               ACCOUNT NO.:  [id number]   MEDICAL RECORD NO.:  [id number]          PATIENT TYPE:  INP   LOCATION:  [id number]                          FACILITY:  [facility]   PHYSICIAN:  [name], M.D.        DATE OF BIRTH:  May 14, 1929   DATE OF ADMISSION:  01/30/2009  DATE OF DISCHARGE:  04/23/2010LH                               DISCHARGE SUMMARY   DISCHARGE DIAGNOSES:  1. Right leg cellulitis/thrombophlebitis.  2. Diabetes mellitus type 2, diet controlled.  3. Insomnia.   DISCHARGE MEDICATIONS:  1. Toprol-XL 50 mg once daily.  2. Aspirin 81 mg p.o. daily.  3. Multivitamin once daily.  4. Oxybutynin extended release 10 mg daily.  5. Amitriptyline 75 mg p.o. nightly.  6. Levaquin 750 mg p.o. daily x6 more days.  7. Vancomycin 1000 mg q.12 h. x7 more days.  8. Tylenol 650 mg q.6 h. p.r.n. for fever and pain.  9. Ibuprofen 600 mg q.6 h. p.r.n. for fever pain.   BRIEF HISTORY OF PRESENT ILLNESS:  Mr. Kuo is a 77 year old gentleman  who had been seen several days previously and treated for right leg  cellulitis, but continue to have pain and weeping from this area and  came in for IV antibiotics.  He initially had a fever 102, treated as  outpatient, but had been afebrile for approximately 5 days prior to  admission, was admitted for IV antibiotics   Laboratory data obtained during hospitalization.  Initial CBC showed  white count 13.9, hemoglobin of 12.4, platelet count of 324.  BMET on  admission shows sodium 136, potassium 4.2, chloride 102, CO2 24, glucose  107, BUN 13, creatinine 0.98, calcium 9.3.  BMET at the time of  discharge showed sodium 141, potassium 4.1, chloride 113, CO2 24,  glucose 113, BUN 15, creatinine of 0.99, and calcium of 8.2.  Chest x-  ray obtained showed just for PICC line placement showed left PICC in  good position, no pneumothorax, no acute disease.   Procedure during the hospital, left PICC line placed.   HOSPITAL COURSE:  1. Right  leg cellulitis and thrombophlebitis.  Initially, the patient      had some serosanguineous type weeping from front of his leg was      very large but every day continued to decrease in size, erythema      also improved, he is very sensitive throughout hospitalization as      far as pain wise and was initially started on Levaquin and      vancomycin to cover both broad-spectrum as well as strep and staph.      This resolved, but the patient needed full 10-day course of      medications and IV wise was sent out with a left PICC line to be      taken care of by home health and will follow up with me in      approximately 7 days after discharge.  He is to use Tylenol and ibuprofen for pain and I do not require any  further stronger pain medicine than  that.  He is encouraged to keep his  leg elevated as much as possible when lying down, but encouraged to  ambulate as much as feels he can.  1. Diabetes mellitus type 2, diet controlled.  His blood sugars did      range just a little bit to the 120s range, but overall state with      in the acceptable range as A1c has been very low as an outpatient.      We will continue monitor as outpatient.  2. Insomnia.  He is to continue on his amitriptyline as previously      prescribed work well for him.   DISPOSITION:  The patient will be seen approximately 1 week for followup  on this right leg.  I will see if in fact needs any further antibiotics  at that time and if not necessary, then we will pull the PICC line.       [name], M.D.  Electronically Signed     ZH/MEDQ  D:  02/07/2009  T:  02/08/2009  Job:  [id number]

## 2011-02-25 NOTE — Assessment & Plan Note (Signed)
[facility]                         GASTROENTEROLOGY OFFICE NOTE   NAME:DECKERKymier, Cleverly                      MRN:          [id number]  DATE:08/31/2007                            DOB:          Dec 14, 1928    CHIEF COMPLAINT:  Followup of celiac disease.   Mr. Pelino had positive celiac antibodies in August.  He had been eating  a bread that 85% of celiac patients could eat that had some type of  old type of wheat in it.  He has eliminated that for two months and  thinks that may have been the source.  He feels well, without any  diarrhea problems.  Dr. Gaynelle Arabian has been supplementing him with  vitamin D 10-1998 units a day after he was found to have a low vitamin D  level.  He tells me he had a DEXA scan a few years ago, after one of the  drug store type health screens had told him he had osteopenia or  osteoporosis in his foot, and was started on Fosamax, but subsequently  the DEXA was normal.  He has never had a repeat.  He brings a list, or  sheet, from the Celi[employer] that talks about followup care for adults  from Dr. Nelwyn Salisbury, an international celiac expert.  We followed  through with that.  He is up to date on his Pneumovax.  He recently had  a physical with Dr. Nevada Crane.  He has had an influenza vaccine.  A ferritin  level and a whole bunch of labs were to be checked by Dr. Nevada Crane and Mr.  Alkins tells me Dr. Nevada Crane will send me those labs.  That is his new  primary care physician.  That is his new primary care physician in  [place].  He had had an iron infusion, as well, since I had last seen  him or talked to him.   PHYSICAL:  Weight 222 pounds, pulse 80, blood pressure 130/58.  EYES:  Anicteric.  NECK:  Supple.  CHEST:  Clear.  HEART:  S1, S2, no murmurs or gallops.  ABDOMEN:  Obese, soft.  There is an upper abdominal ventral hernia that  is easily reducible.  EXTREMITIES:  There is 1+ bilateral lower extremity edema.   MEDICATIONS:   His medications are listed and reviewed in the chart.  He  tells me, as far as he knows, all of his medications are gluten-free, as  well as his supplements.   PAST MEDICAL HISTORY:  Reviewed and unchanged from previous dictation.   ASSESSMENT:  Celiac disease with presumed gluten ingestion in the past  with abnormal celiac antibodies.  He has withdrawn one possible  offending agent.   PLAN:  1. Recheck antibodies.  2. DEXA scan for bone density issues, for celiac disease, osteopenia      and low vitamin D level.  3. He will ask Dr. Gaynelle Arabian to send me the vitamin D levels.  He      might need 50,000-unit weekly dosing for six to eight weeks,      depending upon the dose.  4. Await  labs from Dr. Nevada Crane.  5. Further plans pending this.     Gatha Mayer, MD,FACG  Electronically Signed    CEG/MedQ  DD: 08/31/2007  DT: 08/31/2007  Job #: [id number]   cc:   [name], M.D.  Sigmund I. Gaynelle Arabian, M.D.

## 2011-02-25 NOTE — Assessment & Plan Note (Signed)
[facility]                             PULMONARY OFFICE NOTE   NAME:DECKERKobee, Nong                      MRN:          [id number]  DATE:02/18/2007                            DOB:          07-30-1929    I saw Mr. Boughton today in followup for his REM sleep behavior disorder.   He has been on Rozerem 8 mg q.h.s.  He takes his at about 10 o'clock at  night and goes to sleep about an hour after that.  He has not had any  difficulty taking the medication.  He has also stopped his ReQuip and  Gingko.  He says that he has not had any recurrence of his dream-acting  behavior.  His main complaint right now is that he gets stiffness in his  back when he is sleeping in bed and as a result sometimes has to sleep  in his recliner.  Apparently he has been sleeping on 3 pillows to help  with his reflux symptoms.  I discussed with him that it would be best to  actually elevate the head of his bed to help with reflux symptoms and  that the position that he is sleeping in right now might actually be  exacerbating his symptoms as well as contributing to his back  complaints.  I have also refilled his prescription for Nexium 40 mg  daily.  Otherwise, I would continue him on Rozerem and I have  reemphasized to him the need to have a safe sleep environment.   I will follow up with him in approximately 2-3 months.     Chesley Mires, MD  Electronically Signed    VS/MedQ  DD: 02/18/2007  DT: 02/18/2007  Job #: [id number]

## 2011-02-25 NOTE — Group Therapy Note (Signed)
NAME:  RALIEGH, KARPF NO.:  [id number]   MEDICAL RECORD NO.:  [id number]          PATIENT TYPE:  INP   LOCATION:  [id number]                          FACILITY:  [facility]   PHYSICIAN:  [name], M.D.        DATE OF BIRTH:  1929/02/24   DATE OF PROCEDURE:  02/01/2009  DATE OF DISCHARGE:                                 PROGRESS NOTE   SUBJECTIVE:  Mr. Faz is a 77 year old gentleman who presented with a  several day history of right leg cellulitis and pain.  Overall leg still  has a significant amount of pain, not as much swelling, definitely not  as much pain up in the right thigh area as before.  Has not had any  fever, eating and drinking well with no other complaints.   OBJECTIVE:  VITAL SIGNS:  Temperature is 97.5, blood pressure 146/65,  pulse 76, respirations 16, satting 97% on room air.  GENERAL:  This is an elderly gentleman lying in bed in no acute  distress.  HEENT:  Unremarkable.  LUNGS:  Clear to auscultation bilaterally.  HEART:  Regular rate and rhythm.  ABDOMEN:  Soft, nontender, positive bowel sounds.  EXTREMITIES:  Right leg still elevated, still with erythema to the mid  calf area, still very painful to touch, and does not have as much pain  up in the right thigh area.  No new serosanguineous type drainage noted.  NEUROLOGIC:  The patient is alert and oriented x3, no deficits noted.   LABORATORY DATA:  Vancomycin trough was 14.9.  BMET shows sodium 139,  potassium 4.0, chloride 111, CO2 24, glucose 116, BUN 17, creatinine  0.99, calcium of 8.4.   IMPRESSION:  A 77 year old with cellulitis/thrombophlebitis the right  leg.   ASSESSMENT/PLAN:  1. Right leg cellulitis thrombophlebitis.  Continue on Levaquin and      vancomycin IV for now.  May be able to transition to Levaquin p.o.      but given the slow progression of his right leg, feel that he would      benefit from have another week of vancomycin, thus we will get a      PICC line placed.  The  patient was made aware of this and that may      be able to go home with PICC line as well as getting routine IV      antibiotics 1000 mg q.12 h. at home for another 7 days.  2. Diabetes mellitus type 2, diet-controlled.  Sugars are doing well.      No significant elevation.  3. GI and DVT prophylaxis continued.  The patient is able to ambulate      and get up out of bed with no assistance and would be able to go      home with his condition.   DISPOSITION:  The patient will have a PICC line placed and if continues  stable, will send home with PICC line with IV antibiotic therapy.      [name], M.D.  Electronically Signed     ZH/MEDQ  D:  02/01/2009  T:  02/01/2009  Job:  [id number]

## 2011-02-25 NOTE — Assessment & Plan Note (Signed)
[name]                             PULMONARY OFFICE NOTE   NAME:[name], [name]                      MRN:          [id number]  DATE:02/04/2007                            DOB:          30-Apr-1929    FOLLOWUP NOTE   This is a 77 year old gentleman who has been followed here previously  for obstructive apnea, which resolved after weight loss.  Subsequently  developed with what appeared to be restless legs versus periodic  movement disorder of sleep.  The patient has been treated for this with  mixed results.  He has been having continued difficulties and  subsequently underwent a sleep study at [facility], which  showed the patient had bruxism and also had findings consistent that he  may have REM-associated sleep disorder.  In general, I explained to the  patient that his issues are more that he tends to act out his dreams.  He normally does not have issues with leg movement unless he has a  recurrent dream of being chased.   CURRENT MEDICATIONS:  As noted on the intake sheet.  These have been  reviewed and are accurate.   PHYSICAL EXAM:  VITAL SIGNS:  As noted.  Oxygen saturation is 97% on  room air.  GENERAL:  This is a well-developed, well-nourished gentleman who is in  no acute distress.  HEENT:  Examination is unremarkable.  NECK:  Supple.  No adenopathy noted.  No JVD.  LUNGS:  Clear.  CARDIAC:  Regular rate and rhythm.  No rubs, murmurs, or gallops heard.  ABDOMEN:  Obese, otherwise benign.  EXTREMITIES:  He has no cyanosis, clubbing, or edema noted.   IMPRESSION:  REM associated sleep disorder.   PLAN:  1. Discussed with the patient.  I advised him to continue tapering off      of his Requip as directed by Dr. Halford Chessman.  2. After discussion with Dr. Halford Chessman, since the patient has had failures      with Ativan and Klonopin in the past, mainly because of untoward      side effects, we will prescribe Rozerem 8 mg to be taken  nightly.      I did give the patient enough samples until he sees Dr. Halford Chessman again,      which will be in 2 weeks' time.  In addition, I did reiterate to      the patient that his sleeping environment at home should be safe.      He understood the above.  Followup will be with Dr. Halford Chessman in 2      weeks' time.  He is to contact us prior to that time should any      problems arise.     Renold Don, MD  Electronically Signed    CLG/MedQ  DD: 02/04/2007  DT: 02/04/2007  Job #: [id number]

## 2011-02-25 NOTE — H&P (Signed)
NAMEKENLEE, KEULER               ACCOUNT NO.:  [id number]   MEDICAL RECORD NO.:  [id number]          PATIENT TYPE:  INP   LOCATION:  A323                          FACILITY:  [facility]   PHYSICIAN:  [name], M.D.        DATE OF BIRTH:  October 05, 1929   DATE OF ADMISSION:  01/30/2009  DATE OF DISCHARGE:  LH                              HISTORY & PHYSICAL   CHIEF COMPLAINT:  Right leg pain and cellulitis.   HISTORY OF PRESENT ILLNESS:  Mr. Kawamura is a 77 year old gentleman with  above-mentioned medical issues, who was in his usual state of health  until middle of last week when had a high-grade fever of 102 of unknown  source, and the next day on Thursday developed significant erythema and  swelling of his right leg.  He was started on Keflex at that time and  took 1 days worth, but apparently noted he had had side effects with GI  distress and was then switched to Levaquin, was seen and assessed in my  office on Friday as well as Monday and had very slow improvement.  He  did not have any further fever, but did have continued swelling and  erythema which was slightly improved as well.  This was radiating up  into his right thigh and was severely painful to touch.  He called the  day in office and was in the emergency department due to worsening  swelling and drainage from his right leg, seen and assessed in the  emergency department, found to have significant white count.  No  specific fever, but did have a serosanguineous drainage out of his right  leg.  He stated overall that the redness was little bit better, but pain  had worsened.  He will be admitted for further evaluation.   PAST MEDICAL HISTORY:  1. History of right chest empyema taken care by Dr. Arlyce Dice years ago,      history of AFib.  2. History of celiac disease followed by Dr. Carlean Purl  3. Gastroesophageal reflux disease.  4. History of sleep apnea.  5. History of peripheral neuropathy.  6. History of diet-controlled  diabetes mellitus type 2.  7. History of prostate cancer followed by Dr. Gaynelle Arabian.  8. Osteopenia.  9. Vitamin D deficiency.  10.History of anemia secondary to celiac disease.   MEDICATIONS:  The patient is on:  1. Toprol-XL 50 mg p.o. daily.  2. Niaspan 500 mg p.o. nightly p.r.n.  3. Aspirin.  4. Multivitamins once daily  5. Oxybutynin extended release 5 mg ? daily.  6. Amitriptyline unknown dose nightly.   ALLERGIES:  The patient has allergy to SULFA causes hives.  NOVOCAIN  causes a weak feeling, faintness.  PENICILLIN did cause a rash.   SOCIAL HISTORY:  He lives with his wife.  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  Does not smoke or drink.   FAMILY HISTORY:  Noncontributory.   REVIEW OF SYSTEMS:  Right leg pain.  Denies any chest pains.  No  problems with his breathing.  No further fever.  PHYSICAL EXAMINATION:  VITAL SIGNS:  Temperature is 98.4, blood pressure  134/87, pulse 87, respirations 20, and saturating 97% on room air.  GENERAL:  This is an elderly white gentleman lying in bed in no acute  distress.  HEENT:  Unremarkable.  Pupils are equal, round, and reactive to light  and accommodation.  No scleral icterus.  Oropharynx is clear.  Mucous  membranes moist.  No thyromegaly.  No JVD.  HEART:  Regular rate and rhythm.  No murmurs, gallops, or rubs.  LUNGS:  Good air movement throughout.  No rhonchi or wheezing.  ABDOMEN:  Soft, nontender, positive bowel sounds.  EXTREMITIES:  He has approximately 3+ edema to the right lower  extremity.  Decreased erythema compared to yesterday, but does have a  very small punctate spot of serosanguineous drainage.  Does have some  erythema on his right thigh, which is improved as well.  All of this is  very sensitive to touch.  Palpable pulses in all extremities.  Pain is  from ankle up to approximately three quarters up the calf.  NEUROLOGIC:  The patient is alert and oriented x3.  No deficits  noted.   LABORATORY DATA:  CBC shows white count of 13.9, hemoglobin of 12.4,  platelet count of 324, percent neutrophils 78, absolute neutrophils  10.9.  BMET shows sodium of 136, potassium 4.2, chloride 102, glucose  107, BUN of 13, creatinine of 0.98, and calcium of 9.3.  No x-rays  obtained.   IMPRESSION:  This is a 77 year old gentleman with cellulitis and  probable thrombophlebitis.   ASSESSMENT AND PLAN:  1. Cellulitis/thrombophlebitis.  He has had approximately 4 days of      oral therapy and has very little improvement, was given a dose of      vancomycin in the emergency room and had significant decrease in      erythema, did not have any blood cultures to show bacteria, did      have initial rigors and temperature of 102 and has not had any      fever in the last 5 days, but will continue with Levaquin 750 IV as      well as vancomycin dose per pharmacy.  This may be well been      related to a staphylococcus-type infection and we will continue on      his current therapy for the next several days.  We will treat with      some IV fluids as well.  The patient is eating and drinking well,      continue keep leg elevated, and treat pain appropriately.   1. Diabetes mellitus type 2.  This is diet controlled, has not had any      significant elevation in his sugars.  We will continue to monitor      routinely given his infection and checking q.a.c. and nightly      sugars.   1. Hypertension.  The patient is not having any signs of hypertension.      No other heart abnormality noted.  Continue on the metoprolol.   1. Disposition.  We will cover with gastrointestinal prophylaxis as      well as deep vein thrombosis prophylaxis with Lovenox and      reevaluate the patient in the hospital.       [name], M.D.  Electronically Signed     ZH/MEDQ  D:  01/30/2009  T:  01/31/2009  Job:  [id number]

## 2011-02-25 NOTE — Assessment & Plan Note (Signed)
[facility]                         GASTROENTEROLOGY OFFICE NOTE   NAME:DECKEROndre, Sohm                      MRN:          [id number]  DATE:05/17/2007                            DOB:          April 12, 1929    CHIEF COMPLAINT:  Follow-up of celiac disease.   He is now slightly constipated.  He stopped the milk thistle which he  thinks may have had some gluten in it.  He is waiting to get Rozeram  approved, apparently it is not part of his formulary.  He has not heard  from Dr. Halford Chessman about that and would like to get that taken care of if he  could.  Otherwise, things seem stable.  Due for screening colonoscopy  next year.   MEDICATIONS:  Listed and reviewed in the chart.   PAST MEDICAL HISTORY:  Reviewed and unchanged.   PHYSICAL EXAMINATION:  Weight 221 pounds, pulse 92, blood pressure  118/52.   ASSESSMENT:  Celiac disease with mild anemia, mild elevation in  transaminases in the past.   PLAN:  CBC, anemia panel, hepatic function panel and follow-up celiac  antibodies to reassess today.  Further plans pending that.     Gatha Mayer, MD,FACG  Electronically Signed    CEG/MedQ  DD: 05/17/2007  DT: 05/17/2007  Job #: [id number]   cc:   Chesley Mires, MD  [facility]

## 2011-02-25 NOTE — Group Therapy Note (Signed)
NAME:  [name], [name]               ACCOUNT NO.:  [id number]   MEDICAL RECORD NO.:  [id number]          PATIENT TYPE:  INP   LOCATION:  A323                          FACILITY:  [facility]   PHYSICIAN:  [name], M.D.        DATE OF BIRTH:  [birth year]   DATE OF PROCEDURE:  01/31/2009  DATE OF DISCHARGE:                                 PROGRESS NOTE   SUBJECTIVE:  The patient is a 77 year old gentleman who presented with a  several-day history of right leg cellulitis and pain.  Overall, the  patient states that his leg still hurts, but it is feeling better. Today  I feel the swelling has improved as well.  He has no other specific  complaints at this time.   OBJECTIVE:  VITAL SIGNS:  Temperature is 98.6, blood pressure is 142/66,  pulse is 82,  respirations are 20, and he is satting 99% on room air.  CBGs have been 102, 91 and one 52 this morning respectively.  GENERAL APPEARANCE:  In general this is an elderly gentleman lying in  bed in no acute distress.  HEENT:  The head, eyes, ears, nose, and throat are unremarkable.  LUNGS:  The lungs clear to auscultation bilaterally.  HEART:  The heart has a regular rate and rhythm without murmurs, gallops  or rubs.  ABDOMEN:  The abdomen is soft, nontender and nondistended with positive  bowel sounds.  EXTREMITIES:  The right leg is elevated with decreased erythema  traveling just below the knee and now is at approximately the mid calf.  He has redness of the skin with decreased the amount of swelling.  It is  still painful to touch.  He does have a small spot, which did drain  yellowish serosanguineous fluid, which disappeared yesterday.  No new  erythema of his upper thigh; however, is not as tender as well.  NEUROLOGIC EXAMINATION:  Neurologically he is alert and oriented times  three.  No deficits are noted.   LABORATORY DATA:  CBC shows a white count of 10.9, hemoglobin 11.3 and  platelet count 282,000.  BMET showed a sodium of 139,  potassium 4.3,  chloride 112, CO2 22, glucose 173, BUN 17, creatinine 1.06, and calcium  8.7.   IMPRESSION:  This is a 77 year old male with cellulitis/thrombophlebitis  of the right leg with questionable thrombophlebitis.   ASSESSMENT AND PLAN:  1. Right leg cellulitis/thrombophlebitis.  Continue with the Levaquin      and vancomycin as those have improved it.  We will encourage the      patient to get up and get moving today, and when he is sedentary to      have his leg elevated in bed to help control the swelling and allow      the fluid to drain out further.  2. We will continue him on Lovenox for DVT prophylaxis.  3. Diabetes mellitus Type 2, diet-controlled.  We will continue      monitoring his sugars; they are not significantly elevated.  We      will start covering him today  with insulin for now.  4. Overactive bladder with urinary retention.  Continue his oxybutinin      as previously prescribed.  5. Anxiety.  We will treat this with amitriptyline, which was on hold.   DISPOSITION:  We will give him another day of IV antibiotics.  We may be  able to discontinue these tomorrow or Friday.  Continue Vanc IV  antibiotics and possibly transition him to oral antibiotics if he has  significant improvement.      [name], M.D.  Electronically Signed     ZH/MEDQ  D:  01/31/2009  T:  01/31/2009  Job:  [id number]

## 2011-02-28 NOTE — Op Note (Signed)
NAME:  [name], [name]               ACCOUNT NO.:  [id number]   MEDICAL RECORD NO.:  [id number]          PATIENT TYPE:  AMB   LOCATION:  ENDO                         FACILITY:  [facility]   PHYSICIAN:  Gatha Mayer, MD,FACGDATE OF BIRTH:  [birth year]   DATE OF PROCEDURE:  09/23/2006  DATE OF DISCHARGE:  09/23/2006                               OPERATIVE REPORT   PROCEDURE:  Small bowel capsule endoscopy.   INDICATIONS:  This 77 year old man with abdominal pain, problems with  diarrhea, iron-deficiency anemia and celiac disease.   PROCEDURE DATA:  Height 70 inches, weight 196 pounds, waist 38 inches,  normal built.  The gastric passage time was 18 minutes.  The capsule did  not reach the colon during the study.   FINDINGS:  Classic changes of celiac disease diffusely in portions of  the bowel seen with scalloped fold, cobblestoning and villus atrophy.   SUMMARY AND RECOMMENDATIONS:  This looks like active celiac disease.  His tissue transglutaminase antibody was elevated as well.  We will  review diet and medication, looking gluten contamination.      Gatha Mayer, MD,FACG  Electronically Signed     CEG/MEDQ  D:  09/27/2006  T:  09/28/2006  Job:  [id number]

## 2011-02-28 NOTE — Procedures (Signed)
NAME:  [name], [name] NO.:  [id number]   MEDICAL RECORD NO.:  [id number]          PATIENT TYPE:  OUT   LOCATION:  SLEEP CENTER                 FACILITY:  [facility]   PHYSICIAN:  Chesley Mires, MD        DATE OF BIRTH:  Dec 17, 1928   DATE OF STUDY:  12/25/2006                            NOCTURNAL POLYSOMNOGRAM   REFERRING PHYSICIAN:  Renold Don, MD   INDICATION FOR STUDY:  This is an individual who has snoring with sleep  disturbance and excessive daytime sleepiness.  The patient is referred  to the Sleep Lab for further evaluation of this.  He does also have a  history of restless leg syndrome and periodic limb movement disorder and  a previous diagnosis of obstructive sleep apnea.   EPWORTH SCORE:  11.   MEDICATIONS:  Requip, Nexium, Carafate, aspirin, metoprolol, oxybutynin,  Elavil, alpha lipoic  acid, Ginkgo biloba, calcium, and fish oil.   SLEEP ARCHITECTURE:  Total recording time was 404 minutes.  Total sleep  time was 258 minutes.  Sleep efficiency was 64% which is reduced.  Sleep  latency was 34 minutes which is prolonged.  REM latency is 283 minutes  which is prolonged.  The patient was observed in all stages of sleep but  had a reduction in the percentage of slow wave sleep to 6% of the study  and REM sleep 6% of the study.  He slept in both the supine and  nonsupine position.   RESPIRATORY DATA:  The overall apnea-hypopnea index was 2.8.  The events  were exclusively obstructive in nature.  The supine apnea-hypopnea index  was 2.5; the nonsupine apnea-hypopnea index is 3.4.  The REM apnea-  hypopnea index was 16.6.  The non-REM apnea-hypopnea index was 2.  Mild  occasional snoring was noted by the technician.   OXYGEN DATA:  The baseline oxygenation was 96%.  The oxygen saturation  data was 86%.  This patient spent a total of 1.1 minutes of test time  with an oxygen saturation between 81% to 90%.  The remainder of the test  time was spent with  an oxygen saturation above 91%.   CARDIAC DATA:  Rhythm strip showed normal sinus rhythm.   <MOVEMENT PARASOMNIA>  The periodic limb movement index was 10 which is normal.  The patient  had frequent episodes of bruxism particularly in the first half of the  night.  He also had frequent episodes of leg movements during REM sleep.   IMPRESSION/RECOMMENDATION:  This study does not show evidence for  obstructive sleep apnea as the overall apnea-hypopnea index was 2.8.  He  did have an increase in his apnea-hypopnea index during REM sleep,  however he had a very minimal amount of REM sleep and therefore this may  skew the results of this.  He had frequent episodes of bruxism  particularly in the first half of the night and further evaluation of  this may be necessary.  He also appeared to have episodes of REM without  atonia and clinical correlation would be necessary to determine the  significance of this.  Of note was that his periodic  limb movement index  was 10 and clinical correlation would be necessary to determine the  significance of this.      Chesley Mires, MD  [place], [place] Board of Sleep Medicine  Electronically Signed     VS/MEDQ  D:  01/20/2007 08:55:54  T:  01/20/2007 10:04:28  Job:  [id number]   cc:   Renold Don, MD  [address] [place], [place] [address]

## 2011-02-28 NOTE — Assessment & Plan Note (Signed)
[facility]                             PULMONARY OFFICE NOTE   NAME:[name], [name]                      MRN:          [id number]  DATE:12/17/2006                            DOB:          Nov 12, 1928    This is a very pleasant 77 year old gentleman who presents for followup  on periodic limb movement disorder and restless legs with normal iron  stores and gastroesophageal reflux disease. He presents today for  followup. He is still having some difficulty with sleep. He has been  tried on numerous medications for restless legs. Initially, he was tried  on Mirapex. He could not tolerate this due to unwanted side-effects.  Subsequently, he was tried on Requip which he could not tolerate more  than 0.25 mg due to feeling of having hangover effect. At one point he  also was on a combination of Klonopin and Requip, but currently has been  more recently on Requip 0.25 mg. Recently, we did provide him with  samples of Requip in August of 2007, and subsequently after that he  asked for a few more of these. He noted that as he escalated to 1 mg at  bedtime he was able to tolerate it and he is willing to try this dose  again.   CURRENT MEDICATIONS:  Are as noted on the Intake Sheet. These have been  reviewed and are accurate.   PHYSICAL EXAMINATION:  VITAL SIGNS: As noted. Oxygen saturation is 96%  on room air.  GENERAL: This is a well-developed, somewhat obese gentleman who is in no  acute distress. He does have a ruddy complexion. He is fully ambulatory.  HEENT: Examination is unremarkable.  NECK: Supple. No adenopathy noted. No JVD.  LUNGS:  Clear to auscultation bilaterally.  CARDIAC: Regular rate and rhythm. No rubs, murmurs or gallops heard.  EXTREMITIES: The patient has no cyanosis, no clubbing and no edema  noted.   IMPRESSION:  1. The patient has restless leg syndrome which is manifested mostly by      periodic limb movement at night.  2. Has a  past history of obstructive sleep apnea, need to exclude      potential recurrence of this particularly since the patient's      weight has escalated again.  3. History of gastroesophageal reflux disease, well-controlled.   PLAN:  1. Will be to increase Requip again as tolerates to 1 mg daily.  2. The patient will have sleep study done again.  3. I will refer the patient for a second opinion consult with Dr.      Chesley Mires.  4. Followup in 6 to 8 weeks time. He is to contact us prior to that      time should any new problems arise.    Renold Don, MD  Electronically Signed   CLG/MedQ  DD: 12/17/2006  DT: 12/17/2006  Job #: [id number]

## 2011-02-28 NOTE — Op Note (Signed)
[facility]  Patient:    [name], [name] Visit Number: [id number] MRN: [id number]          Service Type: NES Location: [place] Attending Physician:  Veverly Fells Dictated by:   Shane Crutch Gaynelle Arabian, M.D. 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  POSTOPERATIVE DIAGNOSIS:  Adenocarcinoma of the prostate with bladder neck contract.  OPERATION:  Cystourethroscopy, transurethral incision of bladder neck contract.  SURGEON:  Sigmund I. Gaynelle Arabian, M.D.  ANESTHESIA:  General (LMA).  PREPARATION:  After appropriate preanesthesia, the patient was brought to the operating room, placed on the operating room table in dorsal supine position where general LMA anesthesia was introduced.  He was then replaced in the dorsolithotomy position where the pubis was prepped with Betadine solution and draped in usual fashion.  REVIEW OF HISTORY:   Mr. Karau is a 77 year old male, status post radical retropubic prostatectomy on June 21, 2001, with history of lower extremity pulmonary emboli on Coumadin.  Pathology showed T2B adenocarcinoma of the prostate, Gleason 4+4, with negative margins.  The patient has done well since his surgery, but returned to the office on August 25, 2001, complaining of severe decrease in urination ability, with dripping and urinary leakage.  The patient is now for transurethral incision of bladder neck contracture.  DESCRIPTION OF PROCEDURE:  Cystourethroscopy was accomplished and documentation of the bladder neck contracture was made.  Following this, a guide wire was passed through the contracted urethra into the bladder. Following this, the Collings knife was used to incise the bladder neck, and at the end of the case, the bladder neck easily admitted the 27 scope, and there was no bleeding noted.   The patient specifically requested no Foley catheter, and it was felt that we will try to oblige the patient with this request. Therefore, the bladder was emptied of fluid and xylocaine jelly placed in the urethra.  The patient was not given Toradol because of his anticoagulation status.  He was awakened, however, in good condition and taken to the recovery room in good condition. Dictated by:   Shane Crutch Gaynelle Arabian, M.D. Attending Physician:  Veverly Fells DD:  08/30/01 TD:  08/30/01 Job: [id number]

## 2011-02-28 NOTE — Op Note (Signed)
[facility]  Patient:    [name], [name] Visit Number: [id number] MRN: [id number]          Service Type: SUR Location: 1S X005 01 Attending Physician:  Laymond Purser. Date: 06/21/01 Admit Date:  06/21/2001   CC:         Robyn Haber, M.D.   Operative Report  PREOPERATIVE DIAGNOSIS:  Adenocarcinoma of the prostate.  POSTOPERATIVE DIAGNOSIS:  Adenocarcinoma of the prostate.  OPERATION:  Radical retropubic prostatectomy .  SURGEON:  Sigmund I. Gaynelle Arabian, M.D.  FIRST ASSISTANT:  Rana Snare, M.D.  PREPARATION:  After appropriate preanesthesia, the patient was brought to the operating room, placed on the operating table in the dorsal supine position where general endotracheal anesthesia was introduced.  He was then placed in the supine position with flex of the bed and a roll underneath the low back. The abdomen was shaven, washed with Betadine solution, prepped with Betadine, and draped in the usual fashion.  HISTORY:  This 77 year old male has adenocarcinoma of the prostate, Gleason 8 in the apex bilaterally and Gleason 7 in the right lateral biopsies.  His preop PSA was 6.8 with negative CT and bone scan.  He was now for radical retropubic prostatectomy.  DESCRIPTION OF PROCEDURE:  A 15 cm incision was made from the umbilicus to the pubic tubercle, and subcutaneous tissue was dissected with the electrosurgical unit.  Midline was entered with the electrosurgical unit.  Bilaterally, the pelvic gutter was dissected.  A self-retaining retractor was placed, and attention was directed toward bilateral obturator lymph node dissection.  Bilaterally, obturator lymph nodes are dissected from the external iliac vein, laterally to the pelvic sidewall, and inferiorly to the obturator nerve and distalward to the femoral canal.  Tissue was found bilaterally and sent to the laboratory separately for evaluation under permanent section  analysis.  The retropubic space was then dissected with the electrosurgical unit and then with finger dissection, yielding the puboprostatic ligaments.  The patient then underwent radical prostatectomy by incising the puboprostatic ligaments and then using a Hohenfeltner clamp, dissecting the suprapubic venous blood supply, and ligating it with 0 Vicryl sutures.  A Stamey retractor was then placed, and a third 0 Vicryl suture was then placed to afford even more distal venous control.  The Hohenfeltner was then re-placed, and the bladder neck cut right on top of the urethra.  The Hohenfeltner was then placed behind the urethra, and the remaining portion of the urethra was cut.  The Foley catheter, placed at the beginning of the case, was then clamped and cut externally and brought into the wound and used as a traction device.  Following this, the posterior portion of the prostate was dissected.  Using the Hohenfeltner clamp and clips, the entire vasculature of the prostate was identified and clipped and cut.  The vas, both right and left, was identified and dissected and clipped. The seminal vesicles were then identified bilaterally and dissected.  Prior to removing the seminal vesicles, the bladder neck was identified and dissected using a Vanderbilt clamp.  The bladder neck was then incised, and did not need any closure.  Using a 4-0 Vicryl suture, eversion was accomplished, and 20 Silastic Foley catheter was placed.  A five suture anastomosis was then accomplished using 2-0 Monocryl suture, which was double-armed.  Following anastomosis, the balloon was inflated with 30 cc, irrigated, and was a watertight anastomosis.  A separate Blake drain was placed through a separate stab wound in the left  lower quadrant and sutured in place with 3-0 nylon suture.  Following wound irrigation, the wound was closed with #1 PDS suture in a running fashion.  The pubic subcu tissue was closed with 3-0  Vicryl suture, and the skin was closed with skin stapler.  The wound was then anesthetized with Marcaine 0.5 plain.  The patient was awakened and taken to the recovery room in good condition. Attending Physician:  Veverly Fells DD:  06/21/01 TD:  06/21/01 Job: [id number]

## 2011-02-28 NOTE — Assessment & Plan Note (Signed)
[facility]                         GASTROENTEROLOGY OFFICE NOTE   NAME:DECKERRohnan, Baptist                      MRN:          [id number]  DATE:09/09/2006                            DOB:          1928-12-22    REASON FOR CONSULTATION:  Gas, bloating, diarrhea, lower abdominal pain.   ASSESSMENT:  A 77 year old white man with a diagnosis of celiac disease.  He had a several-week spell of gas, bloating, diarrhea and lower  abdominal pain, which has been resolved for about 3 or 4 weeks.  Stool  studies for C. difficile and culture were negative.  A CT of the abdomen  and pelvis in [place] on August 25, 2006 showed no genitourinary problems  or any problems that would be a cause of this.  As stated, he is better.   This could have been a flare of celiac disease, it could be some sort of  other colonic process, transient infection, partial small bowel  obstruction even.  He has chronic iron deficiency anemia that is  probably related to sprue.  He was Hemoccult negative in August on stool  cards he tells me.  I do not have all of his records of his previous  procedures, he has had a sigmoidoscopy as recently as 2004, and a  previous colonoscopy in 1999 by Dr. Amedeo Plenty.  His celiac disease was  diagnosed by Dr. Amedeo Plenty.  He has had several endoscopies.  He has not had  sprue testing in some time that I am aware of.   PLAN:  1. Sprue panel to see how he is doing we gluten exposure.  He claims      compliance, but the understands the diet is difficult to manage.  2. Capsule endoscopy of the small bowel.  He has had some episode of      some sort of gastroenteritis or colitis problem (IDC-9 558.9), as      well as the iron deficiency anemia and the celiac disease.      Lymphoma is always a risk factor as well, though he does not have      night sweats or persistent weight loss (he lost some weight while      he was ill).  3. We will also discuss his persistent  heartburn despite Nexium once      we sort this out, and check a CBC as well.  I have explained the      risks, benefits and indications of capsule endoscopy including the      possibility of capsule retention.  I will get the records of Dr.      Amedeo Plenty' previous workup and review them.   HISTORY:  A 77 year old white man with problems as above.  He is well  now except for persistent heartburn problems.  In the summer he was  telling Dr. Duwayne Heck, of Pulmonary, he was having dysphagia.  He does  not have that now.  He has had difficulty keeping his iron level up, his  hemoglobin was 12.9 in September.  He has not had any rectal bleeding or  melena.  His  iron saturation was 32% with a TIBC 259, transferrin 185,  iron 83 on June 16, 2006 as well.  His creatinine is normal.  His  ferritin was 44 on July 30, 2006.  He does periodically get iron  infusions.  He did not take any new medications around the time of his  diarrhea.   MEDICATIONS:  1. Calcium at bedtime.  2. Aspirin 81 mg at bedtime.  3. Nasal saline spray.  4. Multivitamin daily.  5. Nexium 40 mg daily.  6. Ginkgo biloba daily.  7. Alpha lipoic acid daily.  8. Amitriptyline 25 mg nightly.  9. Oxybutynin b.i.d.  10.Trileptal 300 mg b.i.d.  11.Requip 0.25 mg 1-1/2 at bedtime.  12.Metoprolol 50 mg daily.  13.Omega fish oil daily.  14.Co Q 10.  15.Lecithin.  16.Zyrtec p.r.n.  17.Patanol eye drops p.r.n.  18.Amoxicillin prior to dental surgery.  19.He is currently on Levaquin for a UTI and Lurselle.   DRUG ALLERGIES:  1. SULFA.  2. PENICILLIN.  3. NOVOCAIN.  4. CLINDAMYCIN.   PAST MEDICAL HISTORY:  1. Restless leg syndrome.  2. Diabetes mellitus.  3. Celiac disease.  4. Hypothyroidism.  5. Total prostatectomy for prostate cancer.  6. Allergies.  7. Sleep apnea.  8. Appendectomy.  9. Hernia repair.  10.Cholecystectomy.  11.Asthmatic bronchitis.  12.Apparent history of esophageal stricture, though I do  not have      those details.  13.History of pleural effusion.  14.Chronic sinusitis.  15.Gastroesophageal reflux disease.   FAMILY HISTORY:  Mother had breast cancer.  His children have not been  tested for sprue.  There is no other family history of sprue that we  know of.   SOCIAL HISTORY:  He is married.  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  No alcohol,  tobacco or drug use.   REVIEW OF SYMPTOMS:  See my medical history form.  He has a UTI, some  chronic urinary incontinence, some hematuria problems, insomnia,  restless leg issues, otherwise see medical history form.   PHYSICAL EXAMINATION:  Reveals a well-developed, elderly, white man.  Height 5 feet 10 inches.  Weight 196 pounds.  Blood pressure 110/52.  Pulse 76.  Eyes anicteric.  ENT shows dentures, otherwise free of lesions.  No  telangiectasia on the tongue or lips.  NECK:  Supple.  No thyromegaly or mass.  CHEST:  Clear.  HEART:  S1 and S2.  ABDOMEN:  Soft and non-tender without organomegaly or mass.  RECTAL EXAM:  Hemoccult negative.  Brown stool.  No mass.  GU:  Prostate is surgically absent.  LYMPHATIC:  No neck, supraclavicular, axillary or inguinal adenopathy.  EXTREMITIES:  Trace peripheral edema bilateral lower extremities.  SKIN:  No rash detected.  NEUROLOGIC/PSYCHIATRIC:  He is alert and oriented x3.   I appreciate the opportunity to care for this patient.     Gatha Mayer, MD,FACG  Electronically Signed    CEG/MedQ  DD: 09/09/2006  DT: 09/09/2006  Job #: [id number]   cc:   Carin Primrose

## 2011-02-28 NOTE — H&P (Signed)
[facility]  Patient:    [name], [name] Visit Number: [id number] MRN: [id number]          Service Type: Attending:  Shane Crutch. Gaynelle Arabian, M.D. Dictated by:   Shane Crutch Gaynelle Arabian, M.D. Adm. Date:  06/21/01   CC:         Robyn Haber, M.D.   History and Physical  HISTORY:  Mr. Campanale is a 77 year old married white male with a PSA of 6.8, biopsy Gleason 7 adenocarcinoma of the prostate in the right lateral biopsies, and Gleason 8 prostate cancer at the apex.  He had a negative CT, negative bone scan.  After considerable evaluation of various treatments including watchful waiting, radical retropubic prostatectomy, radical perineal prostatectomy, radiation therapy, external beam or seed therapy, hormone therapy, and chemotherapy, the patient decided to have radical retropubic prostatectomy.  PAST MEDICAL HISTORY: 1. Right chest empyema drainage by Dr. Arlyce Dice with sclerosis of the chest    on the right side. 2. History of atrial fibrillation treated with aspirin only as well as    Lanoxin. 3. GERD. 4. Sleep apnea treated with CPAP. 5. History of peripheral neuropathy.  ALLERGIES:   (1 NOVOCAINE at dentist office (? EPINEPHRINE), 2) SULFA, 3) PENICILLIN.  MEDICATIONS: 1. Digoxin 250 mcg 1 per day. 2. Trileptal 300 mg 1 tablet h.s. and 1-1/2 tablets h.s. for peripheral    neuropathy. 3. Protonix 40 mg per day. 4. Nasal spray. 5. Aspirin 81 mg per day (stopped on September 2). 6. Niacin. 7. Multivitamins. 8. Minerals.  HABITS:  Alcohol: None.  Tobacco: None.  REVIEW OF SYSTEMS:  Otherwise noncontributory.  PHYSICAL EXAMINATION:  VITAL SIGNS:  On admission, blood pressure 142/68, respiratory rate 16, heart rate 72, temperature 98.5.  HEENT:  PERRL, EOMs full.  NECK:  Supple, nontender, no nodes.  CHEST:  Lungs clear to percussion and auscultation.  ABDOMEN:  Soft, plus bowel sounds.  No organomegaly or masses.  GENITALIA:   Normal male external genitalia with testicles descended bilaterally.  Testicle measures 4 x 4 cm and nontender.  The hair pattern is normal.  The penis and glands are normal.  The urethra is normal.  RECTAL:  3+ benign feeling prostate.  No masses, no blood.  EXTREMITIES:  Without cyanosis or edema.  NEUROLOGIC:  Normal.  ADMITTING IMPRESSION:  Adenocarcinoma of the prostate.  PLAN:  Admit, OR for radical retropubic prostatectomy. Dictated by:   Shane Crutch Gaynelle Arabian, M.D. Attending:  Shane Crutch. Gaynelle Arabian, M.D. DD:  06/21/01 TD:  06/21/01 Job: [id number]

## 2011-02-28 NOTE — Op Note (Signed)
[facility]  Patient:    [name], [name].                     MRN: [id number] Attending:  D. Marlyn Corporal, M.D. CC:         Keturah Barre Marlyn Corporal, M.D. (2 copies)                           Operative Report  REDICTATION  PREOPERATIVE DIAGNOSIS:  Recurrent right pleural effusion.  POSTOPERATIVE DIAGNOSIS:  Chronic empyema with fibrothorax.  OPERATION PERFORMED:  Bronchoscopy, right video-assisted thorascopic surgery (VATS), right thoracotomy with decortication.  SURGEON:  D. Marlyn Corporal, M.D.  ANESTHESIA:  General.  INDICATION FOR PROCEDURE:  This patient had a previous pneumonia and developed  right pleural effusion which had been tapped twice and recurred and was thought to have a loculated effusion. He is brought to the operating room for a VATS approach to this. A possible malignancy was suspected.  DESCRIPTION OF PROCEDURE:  After general anesthesia, the fiberoptic bronchoscope was passed through the endotracheal tube. The right upper lobe, right middle lobe and right lower lobe orifices were normal. The left upper lobe and left lower lobe orifices were normal. No endobronchial lesions were seen and no extrinsic compression of the lung could be seen. The fiberoptic bronchoscope was removed nd the patient returned to the right lateral thoracotomy position. Two trocar sites were made in the anterior and posterior axilla in the seventh intercostal space. An attempt was made to enter this space but there was completely multiple adhesions. I was able to put in a 30-degree scope and this showed multiple adhesions between the lung and the inflammatory adhesions. It looked more like chronic empyema than any type of chronic effusion. A small posterolateral thoracotomy was made at the fifth intercostal space and the latissimus was divided and the serratus was split anterior and the chest cavity was entered and the adhesions were taken  down from the pleura. The pleura was markedly thickened and the right lower lobe was trapped indicative of a fibrothorax with a chronic probably now thorough empyema. Dissection was started peeling the parietal pleura off the lateral chest wall dissecting it from the second intercostal space down to the diaphragm and in the middle lobe was dissected off the middle mediastinum and the upper lobe was also free off the middle mediastinum. The upper lobe was also freed superiorly. There was just a little involvement of the upper lobe although the posterior segment as somewhat involved. The right lower lobe was completely entrapped. All the adhesions were freed from the diaphragm and then as mentioned, the parietal pleura was stripped circumferentially to remove all thickened pleura.  Attention was then started on the right lower lobe, first in the diaphragmatic surface. A 3 to 4 peel was dissected off the lung by sharp and blunt dissection. This continued over the next hour or two dissecting off all of the peel off of he right lower lobe, the right superior segment of the right lower lobe and the inferior portion of the major fissure.  Attention was turned to the right middle lobe and appear the peel was dissected off with sharp and blunt dissection until the right middle lobe was freed up. Finally attention was turned to the right upper lobe and this was freed up also by dissecting the peel off the inferior portion of the right upper lobe. The minor and major fissure had  been dissected out also removing the pseudotumor likely effusion and that were causing pseudotumors in the fissure. Three chest tubes were inserted, two through the trocar site which was 36 straight and then one between the two trocar sites which was a right-angled chest tube and all were sutured in place with #0 silk. There were several small leaks in the lung and these were sutured with 3-0 Vicryl and the  lung and chest cavity was closed with #2 pericostals, #1 Vicryl in the muscle layer, 2-0 Vicryl in the subcutaneous tissue and Ethicon skin clips. The patient was returned to the recovery room. DD:  02/11/00 TD:  02/13/00 Job: [id number]

## 2011-02-28 NOTE — Assessment & Plan Note (Signed)
[place]                             PULMONARY OFFICE NOTE   TERRIL, JUNKINS                      MRN:          [id number]  DATE:01/14/2007                            DOB:          1929-02-21    REFERRING PHYSICIAN:  Renold Don, MD   I met [name] today for evaluation of his sleep difficulties.   He has been having difficulties with this for several years.  He  originally had an overnight polysomnogram on September 11, 2000 with  [facility], and this showed an overall apnea/hypopnea index of  1.3.  He did have a REM apnea/hypopnea index of 21, but only had 30  minutes of REM sleep, and actually very few respiratory events.  He  subsequently underwent a multiple sleep latency test of November 19, 2000  at [facility], and he was found to have a mean sleep latency of  1 minute with a nap sessions 4 out of 4 naps, with sleep onset in 4 out  of 4 naps, and with 3 out of 4 nap sessions with sleep onset REM  periods.  He was actually started previously on Neurontin for a  peripheral neuropathy, and after discontinuation of this, his symptoms  of daytime sleepiness appear to have improved, as he had what appeared  to be a drop attack while on Neurontin, but had not had one since being  discontinued from Neurontin.  He was given a trial of CPAP initially in  March of 2002, and reviewed the record, apparently had symptomatic  improvement.  He had continued on CPAP, but apparently had quite a bit  of difficulty with this, particularly with regards to nasal and oral  dryness, as well as the nasal congestion.  Per review of a download, it  appeared that he spent the majority of his time at a pressure setting of  5 until his pressure setting was increased to 8.  He had his CPAP  discontinued in September of 2004, after it was noted that he had a  significant weight loss of approximately 30 pounds.  He underwent a  repeat  overnight polysomnogram at [facility] on July 10, 2003, and this showed an apnea/hypopnea index of 1.3 with a periodic  limb movement index of 24, but his periodic limb movement index with  arousals was only 2.  Of note was that the majority of his leg movements  per review of his hypnogram was during REM sleep.  He was subsequently  started on Mirapex but was apparently not able to tolerate this.  He was  started on Klonopin in December of 2004, as well as having his Mirapex  changed to Requip.  He apparently developed symptoms of hangover effect  from his Klonopin which was discontinued in February of 2005, and he was  started on Ativan.  He was continued on his regimen of Ativan and  Requip, but it appears that in April of 2005 he switched from Requip to  [place], then he had his Ativan discontinued in February of 2006.  He  was subsequently restarted on Requip, although I am not sure exactly  which date he was switched from the Mirapex back to the Requip.  Then he  had his dose of Requip increased from 0.5 mg to 1 mg at the beginning of  March of 2008, and he had undergone a repeat overnight polysomnogram at  [facility].  I have a preliminary report from this study, and  this showed an overall apnea/hypopnea index of 2.8 and a periodic limb  movement index with arousals of 1.2.   Mr. Mcferren says that his current sleep pattern is that he will go to bed  between 10 and 11 o'clock at night.  It takes him, sometimes, up to 1-2  hours to fall asleep.  This happens about 2-3 times a week.  He says he  will just lay awake.  He says it is easier for him if he tries to fall  asleep sitting up.  He says that once he does fall asleep, he wakes up 2-  4 times during the night to use the bathroom.  He will then wake up at  about 7 o'clock in the morning.  He says that when he wakes up in the  morning, he does not feel overly tired.  He denies having any headaches  in the  morning.  He does not take naps during the day.  He says that he  will occasionally get a feeling of fatigue in the late afternoon if he  eats something, but this goes away.  He says it does not really feel  like being sleepy, however.  He does occasionally snore.  He says the  snoring is worse when he is on his back.  He, apparently, continues to  wear his chin strap from his CPAP machine because he says it helps with  his breathing, but he, again, is not currently using CPAP.  His wife  says that he does kick his legs occasionally while he is asleep,  although he certainly is not aware of this.  He says he does not have  any difficulty as far as falling asleep while driving or doing other  activities, unintentionally.  He does occasionally talk in his sleep,  but there is no history of sleepwalking.  He says that for the last  several years, approximately once a month, he gets a dream in which he  is chasing and fighting an animal, and his wife will wake him up because  he is kicking her.  He says that it is a fairly stereotypical dream,  again, involving him being in some conflict with an animal.  He does not  actually hurt himself or fallen out of bed as a result of this.  He is  not currently using anything to help him fall asleep at night.  He is  not currently using anything to help him stay awake during the day.  He  denies any symptoms of sleep hallucinations or sleep paralysis or  cataplexy.  He also denies any symptoms which would be consistent with  restless leg syndrome.  He also says that since being started on Requip,  as well as since having his dose of Requip increased, he has really not  noticed much difference as far as quality of sleep at night, or his  alertness during the day.   PAST MEDICAL HISTORY:  Otherwise is significant for diabetes,  neuropathy, which, apparently is not related to his diabetes, celiac  sprue,  pneumonia with recurrent pleural effusions, status  post right chest pleurodesis, chronic rhinitis, prostate cancer, melanoma, carpal  tunnel syndrome, Morton's neuroma on both of his feet.  He has had a  left knee arthroscopy.  He has had a cholecystectomy.  He had an  appendectomy.  He has had surgery for removal of his melanoma, as well  as prostate surgery.   CURRENT MEDICATIONS:  1. Calcium.  2. Aspirin 81 mg nightly.  3. Multivitamin once daily.  4. Nexium 40 mg daily.  5. Gingko biloba once daily.  6. Alpha-lipoic acid daily.  7. Elavil 50 mg daily, which he says he uses for his prostate.  8. Oxybutynin.  9. Requip 1 mg nightly.  10.Metoprolol 5 mg daily.  11.Omega fish oil.  12.Coenzyme Q10.  13.Lecithin.  14.Vitamin C.  15.Vitamin D.   He has allergies to SULFA, PENICILLIN, and NOVOCAIN.   SOCIAL HISTORY:  He is married, he has 3 children.  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  There is no  history of tobacco or alcohol use.   FAMILY HISTORY:  Father with heart disease and rheumatic fever.  His  mother had breast cancer.   REVIEW OF SYSTEMS:  Essentially negative except for what is stated  above.   PHYSICAL EXAMINATION:  He is 5 feet 10 inches tall, 220 pounds,  temperature 97.5, blood pressure is 130/60, heart rate is 89, oxygen  saturation 99% on room air.  HEENT:  Pupils reactive.  There is no sinus tenderness, no nasal  discharge.  No oral lesions.  No lymphadenopathy, no thyromegaly.  HEART:  S1, S2, regular rate and rhythm.  CHEST:  There was no wheezing or rales.  ABDOMEN:  Obese, soft, nontender.  Positive bowel sounds.  EXTREMITIES:  No edema, cyanosis, clubbing.  NEUROLOGIC EXAM:  Pupils equal and reactive, extraocular  muscles are  intact.  Cranial nerves II-XII are intact.  He had 5/5 strength is both  his upper and lower extremity.  He had mildly decreased sensation in his  lower extremities.  There was no cerebellar deficits appreciated.  He  had a negative  Romberg's test.  There was negative pronator drift.  He  did have difficulty with balance with heel to toe walking, and he had 1+  but symmetric deep tendon reflexes.  He also had normal muscle tone  without rigidity.   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  I am curious that the mention of  his periodic limb movements on his previous overnight polysomnograms,  based on review of the hypnograms, showed that the majority of his leg  movements are actually seen during REM sleep.  I am curious if perhaps  this could be consistent with REM sleep without atonia.  Additionally,  he had previously been on both Klonopin and Ativan, and perhaps this was  suppressing to some degree his symptoms of REM sleep behavior disorder.  I do not find anything in his clinical history which would be consistent with restless leg syndrome.  His periodic limb movement index was  elevated; however his periodic limb movement index with arousals was in  the normal range.  Therefore, I am not sure if this is actually causing  him any particular difficulty with his sleep, or  causing him symptoms of  excessive daytime sleepiness.  In fact, he does not appear to have any  symptoms of excessive daytime sleepiness at the present time.  He  certainly does not have any evidence for obstructive sleep apnea at the  present time.  He did have the abnormalities on his multiple sleep  latency test, as stated above, but this appears to be related to the use  of his Neurontin.  The symptoms at that time appeared to have abated  after the discontinuation of his Neurontin.  What I would like to do is  to review the actual data from his overnight polysomnogram myself, and  correlate this with his clinical description.  In the  meantime, I have  asked him to discontinue use of his gingko biloba, as it is uncertain  what compounds may be contained in that.  I have also asked him to  slowly taper off the dose of his Requip to see if he notices any  difference in his sleep quality, or if he notices any difference as far  as his alertness during the day.  I have also advised him to use caution  as far as providing a safe bedroom environment, giving the description  of his acting out of his dreams.  I have also discussed with him that  there is a possible association with other neurological disorders,  particularly Parkinson's disease, with REM sleep behavior disorder.  He  certainly does not have clinical findings which would be suggestive of  that at the present time.  I had also informed him that there could be  quite a bit of lag between the development of symptoms of REM sleep  behavior disorder and other neurologic findings.  I do not feel that he  would need to have the reinstitution of a benzodiazepine agent at the  present time, given that his symptoms are somewhat infrequent, although  if his symptoms were to become more frequent, then certainly he could be  started on a benzodiazepine agent.  Alternatively, consideration could  be given to starting him on melatonin supplement or Rozerem.   I will follow up with him in approximately 3-4 weeks.     Chesley Mires, MD  Electronically Signed    VS/MedQ  DD: 01/14/2007  DT: 01/14/2007  Job #: [id number]   cc:   Renold Don, MD

## 2011-02-28 NOTE — Discharge Summary (Signed)
[facility]  Patient:    [name], [name] Visit Number: [id number] MRN: [id number]          Service Type: SUR Location: 3W [id number] 01 Attending Physician:  Veverly Fells Dictated by:   Shane Crutch Gaynelle Arabian, M.D. Admit Date:  06/21/2001 Discharge Date: 06/24/2001   CC:         Robyn Haber, M.D.   Discharge Summary  FINAL DIAGNOSES: 1. Adenocarcinoma of the prostate. 2. History of right chest empyema, remote. 3. History of atrial fibrillation, treated with aspirin only, and Lanoxin. 4. Gastroesophageal reflux disease. 5. Sleep apnea, treated with CPAP. 6. History of peripheral neuropathy.  Surgery on this admission took place on September 9.  The operation was radical retropubic prostatectomy.  ALLERGIES:  NOVOCAINE (?EPINEPHRINE); SULFA; PENICILLIN.  MEDICATIONS: 1. Digoxin 250 mcg one per day. 2. Trileptal 300 mg one per day h.s. and 1-1/2 tablets h.s. for peripheral    neuropathy. 3. Protonix 40 mg a day. 4. Nasal spray. 5. Aspirin 81 mg a day. 6. Niacin. 7. Multivitamins. 8. Minerals.  HABITS:  Alcohol, none.  Tobacco, none.  REVIEW OF SYSTEMS:  Noncontributory.  ADMISSION PHYSICAL EXAMINATION:  The patient is a well-developed white male in no acute distress.  Blood pressure 142/68, respiratory rate 16, heart rate 72, temperature 98.5.  The remaining physical examination is as noted on H&P of June 21, 2001.  ADMISSION LABORATORY DATA:  Shows hemoglobin 14.4 and hematocrit 42.0, and discharge hemoglobin 11.4 with hematocrit 32.8.  Chest x-ray shows no acute disease.  HOSPITAL COURSE:  On the day of admission, the patient underwent radical retropubic prostatectomy.  Final pathology shows T2B, N0, MX disease.  The patient was noted to have adenocarcinoma of the prostate, Gleason 8, involving the right and left lobes of the prostate.  It did not involve the margins. There was focal perineural invasion by the tumor,  in an estimated 15% of the prostate tissue which was examined.  The lymph nodes were negative for tumor. seminal vesicles were negative for tumor.  The patient was admitted via the operating room, where he underwent surgery. Postoperatively, the patient did quite well, with JP drains on postoperative day #2, less than 20 cc.  Terrial Rhodes was removed.  Dressing was changed. The patient passed flatus but has not passed a bowel movement.  He is ready for discharge on September 12, and will be discharged on Tylox, and Cipro 250 mg b.i.d.  DISCHARGE CONDITION:  He is discharged in stable condition. Dictated by:   Shane Crutch Gaynelle Arabian, M.D. Attending Physician:  Veverly Fells DD:  06/23/01 TD:  06/23/01 Job: [id number]

## 2011-02-28 NOTE — Assessment & Plan Note (Signed)
[facility]                         GASTROENTEROLOGY OFFICE NOTE   NAME:[name], [name]                      MRN:          [id number]  DATE:10/12/2006                            DOB:          1929-01-09    CHIEF COMPLAINT:  Follow up of celiac disease.   HISTORY OF PRESENT ILLNESS:  His capsule endoscopy demonstrated active  celiac disease.  The battery stopped before it reached the colon.  He is  having no symptoms.  He does not recall passing it, but there is no  abdominal pain or problems.  His tissue transglutaminase and gliadin  antibodies were positive.  He seems to be doing reasonably well at this  time.  The heartburn he described before is not so much the problem.  He  is on Nexium.  To the best of his knowledge, he thinks he is not getting  gluten in his diet.  He has had what sounds like an ALT or AST of 60 and  Dr. Liane Comber was to communicate that to me it seems, though I have not had  that communication, or we have just not seen the facts yet.  Otherwise,  he feels well.  I have reviewed the situation with him as well as the  capsule endoscopy results and the labs.   PHYSICAL EXAMINATION:  Weight 200 pounds, pulse 70, blood pressure  110/60.   PAST MEDICAL HISTORY:  Reviewed and unchanged from previous note.   ASSESSMENT:  1. Celiac disease which is active.  Note:  He took some milk thistle      at the time his LFT's started to go up, and that is when he had      diarrhea problems, and he wonders if that was not related.  I think      he could be getting gluten in some of the various supplements he is      taking versus medications, though, he believes he is not getting      any medications.  He is on calcium, multivitamin, Ginkgo, alpha      lipoic acid, Omega Fish Oil, Co-Q10.  It certainly is possible      these could have some hidden gluten, and I have asked him to look      at those and discontinue what ever comes up.  2.  Abnormal LFT's.  Likely due to his celiac disease.  This is a mild      increase.   PLAN:  1. Recheck LFT's.  2. He is to look very carefully at his diet and his medications and      supplements and then call me if he determines anything that has      gluten in it.  At that point, we will then determine a time to      recheck his antibodies, which should be done at least annually, but      I would suspect we would want to do them in a few months, once he      identifies something that is in his diet or medication.  If he does  not, we will have to think about this further,  consider a referral      versus steroid treatment.  Will let him know.  3. A screening colonoscopy should be performed in 2009, sooner if      signs or symptoms      warrant.  Though, I think we have an obvious cause of his iron      deficiency given his active celiac disease.     Calvin Mayer, MD,FACG  Electronically Signed    CEG/MedQ  DD: 10/12/2006  DT: 10/12/2006  Job #: [id number]   cc:   [name]

## 2011-02-28 NOTE — Op Note (Signed)
NAME:  [name], [name] NO.:  [id number]   MEDICAL RECORD NO.:  [id number]          PATIENT TYPE:  AMB   LOCATION:  ENDO                         FACILITY:  [facility]   PHYSICIAN:  Gatha Mayer, MD,FACGDATE OF BIRTH:  05-Nov-1928   DATE OF PROCEDURE:  09/23/2006  DATE OF DISCHARGE:  09/23/2006                               OPERATIVE REPORT   GASTROENTEROLOGIST:  Gatha Mayer, MD,FACG.   PROCEDURE:  Small bowel capsule endoscopy.   INDICATIONS:  Abdominal pain and diarrhea, with iron deficiency anemia  and celiac disease.   PROCEDURE DATA:  Height 70 inches, weight 196 pounds, waist 38 inches,  normal build.  The gastric passage time was 18 minutes.  The capsule did  not reach the colon during this study.   FINDINGS:  Classic changes of celiac disease diffusely in the small  bowel, with scalloped folds, cobblestoning and villous atrophy.   SUMMARY AND RECOMMENDATIONS:  This represents active celiac disease.  Will review his diet and medications.  His tissue transglutaminase  antibody is elevated also.      Gatha Mayer, MD,FACG  Electronically Signed     CEG/MEDQ  D:  09/25/2006  T:  09/26/2006  Job:  [id number]

## 2011-03-14 HISTORY — PX: CATARACT EXTRACTION: SUR2

## 2011-04-01 ENCOUNTER — Encounter (HOSPITAL_COMMUNITY)
Admission: RE | Admit: 2011-04-01 | Discharge: 2011-04-01 | Disposition: A | Discharge status: Home / Self Care | Payer: Medicare Other | Source: Ambulatory Visit | Attending: Ophthalmology | Admitting: Ophthalmology

## 2011-04-01 LAB — BASIC METABOLIC PANEL
BUN: 22 mg/dL (ref 6–23)
Chloride: 103 mEq/L (ref 96–112)
Creatinine, Ser: 1.13 mg/dL (ref 0.50–1.35)
GFR calc Af Amer: >60 mL/min (ref >60)
Glucose, Bld: 200 mg/dL — ABNORMAL HIGH (ref 70–99)

## 2011-04-01 LAB — HEMOGLOBIN AND HEMATOCRIT, BLOOD: Hemoglobin: 11.8 g/dL — ABNORMAL LOW (ref 13.0–17.0)

## 2011-04-08 ENCOUNTER — Ambulatory Visit (HOSPITAL_COMMUNITY)
Admission: RE | Admit: 2011-04-08 | Discharge: 2011-04-08 | Disposition: A | Discharge status: Home / Self Care | Payer: Medicare Other | Source: Ambulatory Visit | Attending: Ophthalmology | Admitting: Ophthalmology

## 2011-04-08 ENCOUNTER — Encounter: Payer: Self-pay | Admitting: Internal Medicine

## 2011-04-08 DIAGNOSIS — Z01812 Encounter for preprocedural laboratory examination: Secondary | ICD-10-CM | POA: Insufficient documentation

## 2011-04-08 DIAGNOSIS — Z01810 Encounter for preprocedural cardiovascular examination: Secondary | ICD-10-CM | POA: Insufficient documentation

## 2011-04-08 DIAGNOSIS — H2510 Age-related nuclear cataract, unspecified eye: Secondary | ICD-10-CM | POA: Insufficient documentation

## 2011-04-08 DIAGNOSIS — Z7982 Long term (current) use of aspirin: Secondary | ICD-10-CM | POA: Insufficient documentation

## 2011-08-21 ENCOUNTER — Ambulatory Visit (INDEPENDENT_AMBULATORY_CARE_PROVIDER_SITE_OTHER): Payer: Medicare Other | Admitting: Internal Medicine

## 2011-08-21 ENCOUNTER — Other Ambulatory Visit: Payer: Medicare Other

## 2011-08-21 ENCOUNTER — Encounter: Payer: Self-pay | Admitting: Internal Medicine

## 2011-08-21 VITALS — BP 128/60 | HR 68 | Ht 70.0 in | Wt 223.0 lb

## 2011-08-21 DIAGNOSIS — K5900 Constipation, unspecified: Secondary | ICD-10-CM

## 2011-08-21 DIAGNOSIS — R110 Nausea: Secondary | ICD-10-CM

## 2011-08-21 DIAGNOSIS — K900 Celiac disease: Secondary | ICD-10-CM

## 2011-08-21 NOTE — Progress Notes (Signed)
[facility]  [name] [id number] 03-Jun-1929   HPI: Patient returns for annual followup of his celiac disease. Over the years he  Claims compliance with gluten-free diet though his tissue transglutaminase antibodies have been elevated most of the time.. Over the past several months perhaps half a year or more he has intermittent spells of nausea. He also notices that things seem to be sloshing around in his belly at times. That is to say, here for liquid moving around. He has been constipated but taking psyllium tablets or capsules has relieved that. He says his hemoglobin A1c was 6.5 recently. He had other labs but I don't have copies of those. He has not noted change in the caliber of stools or bleeding. In general he has done okay with his chronic medical problems. 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   Outpatient Encounter Prescriptions as of 08/21/2011  Medication Sig Dispense Refill  . Alpha-Lipoic Acid 300 MG CAPS Take 1 capsule by mouth daily.        Marland Kitchen amitriptyline (ELAVIL) 75 MG tablet Take 75 mg by mouth daily.        . Ascorbic Acid (VITAMIN C) 1000 MG tablet Take 1,000 mg by mouth 2 (two) times daily.        Marland Kitchen aspirin 81 MG tablet Take 81 mg by mouth daily.        . calcium-vitamin D (OSCAL WITH D) 500-200 MG-UNIT per tablet Take 1 tablet by mouth 3 (three) times daily.        . Cholecalciferol (VITAMIN D3) 1000 UNITS CAPS Take 1 tablet by mouth 2 (two) times daily.        . Coenzyme Q10 (COQ10) 200 MG CAPS Take 1 capsule by mouth daily.        Marland Kitchen levothyroxine (SYNTHROID, LEVOTHROID) 125 MCG tablet Take 125 mcg by mouth daily.        . magnesium gluconate (MAGONATE) 500 MG tablet Take 500 mg by mouth daily.        . Melatonin 3 MG TABS Take 1 tablet by mouth daily.        . metoprolol (LOPRESSOR) 50 MG tablet Take 50 mg by mouth daily.        . Multiple Vitamin (MULTIVITAMIN) tablet Take 1 tablet by mouth daily.        .  niacin (NIASPAN) 1000 MG CR tablet Take 1,000 mg by mouth daily.        Marland Kitchen olmesartan (BENICAR) 20 MG tablet Take 10 mg by mouth daily.        . psyllium (REGULOID) 0.52 G capsule Take 6 capsules by mouth 2 (two) times daily.        Marland Kitchen senna (SENOKOT) 8.6 MG tablet Take 1 tablet by mouth daily as needed.        Marland Kitchen DISCONTD: FA-B6-B12-Omega 3-Biotin-Cr (DIVISTA) 1 MG CAPS Take 1 capsule by mouth daily.        Marland Kitchen DISCONTD: oxybutynin (DITROPAN XL) 15 MG 24 hr tablet Take 15 mg by mouth daily.         Allergies  Allergen Reactions  . Cephalexin   . Penicillins   . Procaine Hcl   . Sulfonamide Derivatives    Past Medical History  Diagnosis Date  . Celiac disease   . Iron deficiency anemia   . GERD (gastroesophageal reflux disease)   . REM sleep behavior disorder   . Hypothyroidism   . RLS (restless legs  syndrome)   . History of pneumonia     w/pleural effusion  . Asthmatic bronchitis   . Melanoma   . Cellulitis   . Diverticulosis   . Prostate cancer   . Gastric polyps   . DM (diabetes mellitus)   . Atrial fibrillation   . Sleep apnea   . Allergic rhinitis   . Neuropathy   . Fx. left wrist 1987   Past Surgical History  Procedure Date  . Appendectomy   . Cholecystectomy 2001  . Prostatectomy 2002  . Foot surgery   . Hernia repair 1994    bilateral  . Lung surgery 2001  . Knee surgery     x 2  . Carpal tunnel release   . Colonoscopy w/ biopsies 04/27/2008    diverticulosis, duodenitis  . Upper gastrointestinal endoscopy 04/27/2008    celiac disease, gastric polyps  . Cataract extraction 03/2011    bilateral       PE: Filed Vitals:   08/21/11 1139  BP: 128/60  Pulse: 68   Obese well-developed elderly white man in no acute distress Lungs clear Heart S1-S2 erosions or gallops The abdomen is obese with a medium to large diastasis recti it is soft and nontender without organomegaly or mass. There is no succussion splash.    Assessment/Plan:

## 2011-08-21 NOTE — Patient Instructions (Signed)
Please go to the basement upon leaving today to have your labs done. We will request your labs from [facility]. We will contact you with results and plans.

## 2011-08-21 NOTE — Assessment & Plan Note (Addendum)
I will ask to see his recent labs from primary care. We'll check a tissue transglutaminase antibody again. If it is markedly elevated maybe that has something to do with his nausea. His symptoms are vague and not disabling overall so it's hard to trigger other investigative workup for that unless his labs suggest something otherwise. He has claimed to be gluten-free over the years though he has continued to have elevated tissue transglutaminase antibodies having normal once only once in the last 4 years.

## 2011-08-22 DIAGNOSIS — K5900 Constipation, unspecified: Secondary | ICD-10-CM | POA: Insufficient documentation

## 2011-08-22 NOTE — Assessment & Plan Note (Signed)
Better with psyllium. To continue that.

## 2011-08-27 NOTE — Progress Notes (Signed)
Quick Note:  Celiac antibodies remain high indicating he is ingesting or being exposed to gluten somehow. This may be linked to his nausea.  Please cc Dr. Wende Neighbors ______

## 2012-03-18 ENCOUNTER — Ambulatory Visit (HOSPITAL_COMMUNITY)
Admission: RE | Admit: 2012-03-18 | Discharge: 2012-03-18 | Disposition: A | Discharge status: Home / Self Care | Payer: Medicare Other | Source: Ambulatory Visit | Attending: Internal Medicine | Admitting: Internal Medicine

## 2012-03-18 ENCOUNTER — Other Ambulatory Visit (HOSPITAL_COMMUNITY): Payer: Self-pay | Admitting: Internal Medicine

## 2012-03-18 DIAGNOSIS — M25512 Pain in left shoulder: Secondary | ICD-10-CM

## 2012-03-18 DIAGNOSIS — M25519 Pain in unspecified shoulder: Secondary | ICD-10-CM | POA: Insufficient documentation

## 2012-03-18 DIAGNOSIS — M25559 Pain in unspecified hip: Secondary | ICD-10-CM

## 2012-03-19 ENCOUNTER — Ambulatory Visit (INDEPENDENT_AMBULATORY_CARE_PROVIDER_SITE_OTHER): Payer: Medicare Other | Admitting: Internal Medicine

## 2012-03-19 VITALS — BP 133/66 | HR 88 | Ht 70.0 in | Wt 226.0 lb

## 2012-03-19 DIAGNOSIS — K2960 Other gastritis without bleeding: Secondary | ICD-10-CM

## 2012-03-19 DIAGNOSIS — D62 Acute posthemorrhagic anemia: Secondary | ICD-10-CM

## 2012-03-19 DIAGNOSIS — K921 Melena: Secondary | ICD-10-CM

## 2012-03-19 MED ORDER — OMEPRAZOLE-SODIUM BICARBONATE 40-1100 MG PO CAPS: 1.0000 | ORAL_CAPSULE | Freq: Every day | ORAL | Status: DC

## 2012-03-19 NOTE — Progress Notes (Addendum)
  Subjective:    Patient ID: [name], male    DOB: 04-03-1929, 78 y.o.   MRN: [id number]  HPI This elderly white man is known to be because of celiac disease. In the past week or so he has had a problem with an abscessed tooth it was very painful. Hydrocodone pain medication was unhelpful. He ended up going on ibuprofen in prescription strength brief 4-6 hours. A few days after doing that he started having jet black stools which occurred 6 days ago. He realize the ibuprofen was a problem, he stopped the aspirin and that. His stools are probably just darker now. He saw primary care, Dr. Nevada Crane, 2 days ago. He was called this morning and said that his hemoglobin was low and that he needed to see me. He has felt somewhat weak and lightheaded at times is feeling better, he is not having any chest pain or angina or extreme dyspnea.  Medications, allergies, past medical history, past surgical history, family history and social history are reviewed and updated in the EMR.    Review of Systems As above    Objective:   Physical Exam General:  NAD obese Eyes:   anicteric Lungs:  clear Heart:  S1S2 no rubs, murmurs or gallops Abdomen:  soft and nontender, BS+ obese Rectal: Brown, heme-positive stool, turns positive slowly Ext:   no edema    Data Reviewed:   CBC from 2 days ago requested and will be reviewed   It has returned, addendum reported. Hemoglobin 8.9 on June 5. It was 11.2 on May 30.    Assessment & Plan:   1. Melena   2. Acute posthemorrhagic anemia   3. NSAID induced gastritis    He's had melena induced by ibuprofen which was probably from an erosive gastritis. This is resolving. He has an anemia related to this. I explained the option of possible upper endoscopy to confirm this versus empiric diagnosis and therapy. He prefers the latter. He will continue PPI for a month. Using AcipHex and then Zegerid samples. He will restart his baby aspirin in 2 weeks. He will followup  with Dr. Nevada Crane regarding the hemoglobin and I've instructed them to call back if he develops any melena. Also explained that if he needs regular end-stage in the future he should take PPI.  CC: HALL,ZACK, MD,  I will contact Dr. Nevada Crane and ask the patient to have a repeat hemoglobin on June 10.

## 2012-03-19 NOTE — Patient Instructions (Addendum)
Per Dr. Carlean Purl you may re-start your 81mg  Asprin in 2 weeks.    After you finish the Aciphex you have start the Zegerid one capsule 75mins. Prior to breakfast.  We are giving you samples of Zegerid today. Take the Zegerid for 1 month.  It is over the counter.  If you have more jet black stools give [place] a call back.  Please follow-up with Dr. Nevada Crane in 1 month.

## 2012-03-23 ENCOUNTER — Other Ambulatory Visit (INDEPENDENT_AMBULATORY_CARE_PROVIDER_SITE_OTHER): Payer: Medicare Other

## 2012-03-23 ENCOUNTER — Other Ambulatory Visit: Payer: Self-pay

## 2012-03-23 ENCOUNTER — Other Ambulatory Visit: Payer: Self-pay | Admitting: Internal Medicine

## 2012-03-23 DIAGNOSIS — D62 Acute posthemorrhagic anemia: Secondary | ICD-10-CM

## 2012-03-23 LAB — CBC WITH DIFFERENTIAL/PLATELET
Basophils Absolute: 0 10*3/uL (ref 0.0–0.1)
Eosinophils Absolute: 0.3 10*3/uL (ref 0.0–0.7)
Hemoglobin: 8.4 g/dL — ABNORMAL LOW (ref 13.0–17.0)
Lymphocytes Relative: 19.2 % (ref 12.0–46.0)
MCHC: 32.9 g/dL (ref 30.0–36.0)
Monocytes Relative: 11.1 % (ref 3.0–12.0)
Neutro Abs: 5.7 10*3/uL (ref 1.4–7.7)
Neutrophils Relative %: 66.1 % (ref 43.0–77.0)
RBC: 2.52 Mil/uL — ABNORMAL LOW (ref 4.22–5.81)
RDW: 14.1 % (ref 11.5–14.6)

## 2012-03-23 MED ORDER — FERROUS SULFATE 325 (65 FE) MG PO TABS: 325.0000 mg | ORAL_TABLET | Freq: Two times a day (BID) | ORAL | Status: DC

## 2012-03-23 NOTE — Progress Notes (Signed)
Quick Note:  Let him know Hgb 8.4, was 8.9 last week at Dr. Juel Burrow office This is why he is tired. Take it easy Start ferrous sulfate 325 mg bid and recheck CBC in 1 month here or with Dr. Nevada Crane  Cc: Wende Neighbors, MD please ______

## 2012-07-05 ENCOUNTER — Encounter: Payer: Self-pay | Admitting: Internal Medicine

## 2012-11-07 ENCOUNTER — Encounter (HOSPITAL_COMMUNITY): Payer: Self-pay | Admitting: Emergency Medicine

## 2012-11-07 ENCOUNTER — Emergency Department (INDEPENDENT_AMBULATORY_CARE_PROVIDER_SITE_OTHER): Payer: Medicare Other

## 2012-11-07 ENCOUNTER — Emergency Department (HOSPITAL_COMMUNITY)
Admission: EM | Admit: 2012-11-07 | Discharge: 2012-11-07 | Disposition: A | Discharge status: Home / Self Care | Payer: Medicare Other | Source: Home / Self Care | Attending: Family Medicine | Admitting: Family Medicine

## 2012-11-07 DIAGNOSIS — J069 Acute upper respiratory infection, unspecified: Secondary | ICD-10-CM

## 2012-11-07 MED ORDER — DEXTROMETHORPHAN POLISTIREX 30 MG/5ML PO LQCR: 60.0000 mg | Freq: Two times a day (BID) | ORAL | Status: DC

## 2012-11-07 MED ORDER — AZITHROMYCIN 250 MG PO TABS: ORAL_TABLET | ORAL | Status: DC

## 2012-11-07 NOTE — ED Notes (Addendum)
Pt states symptoms present since Thursday along with weakness/fatigue.

## 2012-11-07 NOTE — ED Notes (Signed)
Pt c/o chest congestion with sob. Denies chest pain. Dry nonproductive cough. Runny nose. Hx of lung surgery.   Tried otc meds with no relief.

## 2012-11-07 NOTE — ED Provider Notes (Signed)
History     CSN: [id number]  Arrival date & time 11/07/12  1504   First MD Initiated Contact with Patient 11/07/12 1505      Chief Complaint  Patient presents with  . URI    chest cong. sob. dry cough. denies chest pain. hx of lung surgery.     (Consider location/radiation/quality/duration/timing/severity/associated sxs/prior treatment) Patient is a 78 y.o. male presenting with URI. The history is provided by the patient.  URI The primary symptoms include cough. Primary symptoms do not include fever, wheezing, nausea, vomiting, myalgias or rash. The current episode started 3 to 5 days ago. The problem has been gradually worsening.  The onset of the illness is associated with exposure to sick contacts. Symptoms associated with the illness include congestion. The illness is not associated with chills.    Past Medical History  Diagnosis Date  . Celiac disease   . Iron deficiency anemia   . GERD (gastroesophageal reflux disease)   . REM sleep behavior disorder   . Hypothyroidism   . RLS (restless legs syndrome)   . History of pneumonia     w/pleural effusion  . Asthmatic bronchitis   . Melanoma   . Cellulitis   . Diverticulosis   . Prostate cancer   . Gastric polyps   . DM (diabetes mellitus)   . Atrial fibrillation   . Sleep apnea   . Allergic rhinitis   . Neuropathy   . Fx. left wrist 1987    Past Surgical History  Procedure Date  . Appendectomy   . Cholecystectomy 2001  . Prostatectomy 2002  . Foot surgery   . Hernia repair 1994    bilateral  . Lung surgery 2001  . Knee surgery     x 2  . Carpal tunnel release   . Colonoscopy w/ biopsies 04/27/2008    diverticulosis, duodenitis  . Upper gastrointestinal endoscopy 04/27/2008    celiac disease, gastric polyps  . Cataract extraction 03/2011    bilateral     Family History  Problem Relation Age of Onset  . Breast cancer Mother   . Kidney failure Father   . Heart disease Father   . Rheumatic fever Father    . Colon cancer      History  Substance Use Topics  . Smoking status: Never Smoker   . Smokeless tobacco: Never Used  . Alcohol Use: No      Review of Systems  Constitutional: Positive for activity change. Negative for fever and chills.  HENT: Positive for congestion.   Respiratory: Positive for cough and shortness of breath. Negative for chest tightness, wheezing and stridor.   Cardiovascular: Positive for leg swelling. Negative for chest pain.  Gastrointestinal: Negative.  Negative for nausea and vomiting.  Genitourinary: Negative.   Musculoskeletal: Negative for myalgias.  Skin: Negative for rash.    Allergies  Cephalexin; Hydrocodone; Penicillins; Procaine hcl; and Sulfonamide derivatives  Home Medications   Current Outpatient Rx  Name  Route  Sig  Dispense  Refill  . AMITRIPTYLINE HCL 75 MG PO TABS   Oral   Take 75 mg by mouth daily.           . ASPIRIN 81 MG PO TABS   Oral   Take 81 mg by mouth daily.           Marland Kitchen LEVOTHYROXINE SODIUM 125 MCG PO TABS   Oral   Take 137 mcg by mouth daily.          Marland Kitchen  METOPROLOL TARTRATE 50 MG PO TABS   Oral   Take 50 mg by mouth daily.           Marland Kitchen ONE-DAILY MULTI VITAMINS PO TABS   Oral   Take 1 tablet by mouth daily.           . ALPHA-LIPOIC ACID 300 MG PO CAPS   Oral   Take 1 capsule by mouth daily.           Marland Kitchen VITAMIN C 1000 MG PO TABS   Oral   Take 1,000 mg by mouth 2 (two) times daily.           . AZITHROMYCIN 250 MG PO TABS      Take as directed on pack   6 each   0   . CALCIUM CARBONATE-VITAMIN D 500-200 MG-UNIT PO TABS   Oral   Take 1 tablet by mouth 3 (three) times daily.           Marland Kitchen VITAMIN D3 1000 UNITS PO CAPS   Oral   Take 1 tablet by mouth 2 (two) times daily.           . COQ10 200 MG PO CAPS   Oral   Take 1 capsule by mouth daily.           Marland Kitchen DEXTROMETHORPHAN POLISTIREX ER 30 MG/5ML PO LQCR   Oral   Take 10 mLs (60 mg total) by mouth 2 (two) times daily. As needed for  cough   89 mL   0   . FERROUS SULFATE 325 (65 FE) MG PO TABS   Oral   Take 1 tablet (325 mg total) by mouth 2 (two) times daily.   1 tablet   0   . MAGNESIUM GLUCONATE 500 MG PO TABS   Oral   Take 500 mg by mouth daily.           Marland Kitchen MELATONIN 3 MG PO TABS   Oral   Take 1 tablet by mouth daily.           Marland Kitchen NIACIN ER (ANTIHYPERLIPIDEMIC) 1000 MG PO TBCR   Oral   Take 1,000 mg by mouth daily.           Marland Kitchen OLMESARTAN MEDOXOMIL 20 MG PO TABS   Oral   Take 10 mg by mouth daily.           Marland Kitchen OMEPRAZOLE-SODIUM BICARBONATE 40-1100 MG PO CAPS   Oral   Take 1 capsule by mouth daily before breakfast.   20 capsule   0   . PSYLLIUM 0.52 G PO CAPS   Oral   Take 6 capsules by mouth 2 (two) times daily.           Marland Kitchen RABEPRAZOLE SODIUM 20 MG PO TBEC   Oral   Take 20 mg by mouth daily.         . SENNOSIDES 8.6 MG PO TABS   Oral   Take 1 tablet by mouth daily as needed.             BP 132/69  Pulse 88  Temp 99.3 F (37.4 C) (Oral)  Resp 20  SpO2 94%  Physical Exam  Nursing note and vitals reviewed. Constitutional: He is oriented to person, place, and time. He appears well-developed and well-nourished.  HENT:  Head: Normocephalic.  Right Ear: External ear normal.  Left Ear: External ear normal.  Mouth/Throat: Oropharynx is clear and moist.  Eyes: Conjunctivae normal are  normal. Pupils are equal, round, and reactive to light.  Neck: Normal range of motion. Neck supple.  Cardiovascular: Normal rate, regular rhythm, normal heart sounds and intact distal pulses.   Pulmonary/Chest: Effort normal and breath sounds normal.  Abdominal: Soft. Bowel sounds are normal.  Musculoskeletal: He exhibits edema.  Lymphadenopathy:    He has no cervical adenopathy.  Neurological: He is alert and oriented to person, place, and time.  Skin: Skin is warm and dry.    ED Course  Procedures (including critical care time)  Labs Reviewed - No data to display Dg Chest 2  View  11/07/2012  *RADIOLOGY REPORT*  Clinical Data: Chest congestion.  Shortness of breath.  Rhinorrhea.  CHEST - 2 VIEW  Comparison: 02/02/2009  Findings: Heart size in the upper normal range.  Post-traumatic or postoperative findings along the right lateral chest wall. Otherwise the lungs appear clear.  The  Thoracic spondylosis is present along with mild thoracic kyphosis. Large lung volumes are noted.  No pulmonary edema observed.  No pneumonia identified.  IMPRESSION:  1.  Upper normal heart size, without edema. 2.  Postoperative findings in the right chest. 3.  Thoracic spondylosis.   Original Report Authenticated By: Van Clines, M.D.      1. URI (upper respiratory infection)       MDM  X-rays reviewed and report per radiologist.         Billy Fischer, MD 11/07/12 1750

## 2013-06-29 ENCOUNTER — Other Ambulatory Visit (HOSPITAL_COMMUNITY): Payer: Self-pay | Admitting: Internal Medicine

## 2013-06-29 DIAGNOSIS — M25512 Pain in left shoulder: Secondary | ICD-10-CM

## 2013-06-30 ENCOUNTER — Ambulatory Visit (HOSPITAL_COMMUNITY)
Admission: RE | Admit: 2013-06-30 | Discharge: 2013-06-30 | Disposition: A | Discharge status: Home / Self Care | Payer: Medicare Other | Source: Ambulatory Visit | Attending: Internal Medicine | Admitting: Internal Medicine

## 2013-06-30 DIAGNOSIS — M25519 Pain in unspecified shoulder: Secondary | ICD-10-CM | POA: Insufficient documentation

## 2013-06-30 DIAGNOSIS — M25512 Pain in left shoulder: Secondary | ICD-10-CM

## 2013-06-30 DIAGNOSIS — M249 Joint derangement, unspecified: Secondary | ICD-10-CM | POA: Insufficient documentation

## 2013-07-07 ENCOUNTER — Ambulatory Visit: Payer: Medicare Other | Admitting: Orthopedic Surgery

## 2013-07-15 ENCOUNTER — Ambulatory Visit (INDEPENDENT_AMBULATORY_CARE_PROVIDER_SITE_OTHER): Payer: Medicare Other | Admitting: Pulmonary Disease

## 2013-07-15 ENCOUNTER — Encounter: Payer: Self-pay | Admitting: Pulmonary Disease

## 2013-07-15 VITALS — BP 112/66 | HR 88 | Temp 98.1°F | Ht 70.0 in | Wt 217.2 lb

## 2013-07-15 DIAGNOSIS — J45909 Unspecified asthma, uncomplicated: Secondary | ICD-10-CM | POA: Insufficient documentation

## 2013-07-15 DIAGNOSIS — R0609 Other forms of dyspnea: Secondary | ICD-10-CM

## 2013-07-15 DIAGNOSIS — R05 Cough: Secondary | ICD-10-CM

## 2013-07-15 DIAGNOSIS — R0600 Dyspnea, unspecified: Secondary | ICD-10-CM

## 2013-07-15 MED ORDER — BECLOMETHASONE DIPROPIONATE 80 MCG/ACT IN AERS: 2.0000 | INHALATION_SPRAY | Freq: Two times a day (BID) | RESPIRATORY_TRACT | Status: DC

## 2013-07-15 MED ORDER — ALBUTEROL SULFATE HFA 108 (90 BASE) MCG/ACT IN AERS: 2.0000 | INHALATION_SPRAY | Freq: Four times a day (QID) | RESPIRATORY_TRACT | Status: DC | PRN

## 2013-07-15 NOTE — Progress Notes (Signed)
  Subjective:    Patient ID: [name], male    DOB: 1929/05/26, 79 y.o.   MRN: [id number]  HPI The patient is an 79 year old male who had been asked to see for recurrent sinopulmonary infections.  The patient tells me that he has had 3 bouts of infection in the last 5 months, and he does not improve each time until after he gets antibiotics.  The last few times he has required a course of prednisone as well.  His last episode was approximately one month ago.  He describes these episodes as increasing cough wheezing and rhinorrhea along with postnasal drip.  He has skin mucous from his nose or chest until he gets on an antibiotic, and then he begins bringing up purulent material from both his nose and chest.  The first time he had total clearance after treatment with a Z-Pak, but the other 2 episodes did not resolve until he was treated with prednisone.  He was treated with Levaquin and prednisone about one month ago.  He denies any significant allergy symptoms or history of asthma, but most recently has had worsening sneezing.  He denies a history of chronic sinusitis, but has never had a scan of his sinuses.  He tells me that spirometry in the past has been unremarkable, and I have reviewed his chest x-ray from January of this year that was totally clear.  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  The patient has also had dental work recently with a complication of a dental implant.  He denies having chronic teeth issues.  At baseline, the patient does have dyspnea on exertion with heavier activities, but a lot of this he believes is caused by severe joint issues.  When he is at baseline, he has no significant cough.   Review of Systems  Constitutional: Negative for fever and unexpected weight change.  HENT: Positive for rhinorrhea, sneezing and dental problem. Negative for ear pain, nosebleeds, congestion, sore throat, trouble swallowing,  postnasal drip and sinus pressure.   Eyes: Positive for itching. Negative for redness.  Respiratory: Positive for cough, shortness of breath and wheezing. Negative for chest tightness.   Cardiovascular: Positive for leg swelling. Negative for palpitations.  Gastrointestinal: Negative for nausea and vomiting.  Genitourinary: Negative for dysuria.  Musculoskeletal: Negative for joint swelling.  Skin: Negative for rash.  Neurological: Negative for headaches.  Hematological: Does not bruise/bleed easily.  Psychiatric/Behavioral: Negative for dysphoric mood. The patient is not nervous/anxious.        Objective:   Physical Exam Constitutional:  Well developed, no acute distress  HENT:  Nares patent without discharge  Oropharynx without exudate, palate and uvula are normal  Eyes:  Perrla, eomi, no scleral icterus  Neck:  No JVD, no TMG  Cardiovascular:  Normal rate, regular rhythm, no rubs or gallops.  No murmurs        Intact distal pulses  Pulmonary :  Normal breath sounds, no stridor or respiratory distress   No rhonchi, or wheezing.  Subtle basilar crackles.  Abdominal:  Soft, nondistended, bowel sounds present.  No tenderness noted.   Musculoskeletal:  mild lower extremity edema noted.  Lymph Nodes:  No cervical lymphadenopathy noted  Skin:  No cyanosis noted  Neurologic:  Alert, appropriate, moves all 4 extremities without obvious deficit.         Assessment & Plan:

## 2013-07-15 NOTE — Assessment & Plan Note (Signed)
The patient has mild airflow obstruction on spirometry today, and with his history of nonsmoking, I suspect this is related to occult asthma.  We can also see airflow obstruction in patient with underlying bronchiectasis.  At this point, I would like to treat him as if this is asthma, and see if things improve.  If he continues to have recurrent pulmonary infections, he will need a CT of his sinuses and also to his chest to rule out chronic sinusitis and bronchiectasis.  We'll start with an inhaled corticosteroid alone, and see how he responds.

## 2013-07-15 NOTE — Patient Instructions (Addendum)
Will start on qvar 55mcg, 2 inhalations each am and pm everyday.  Rinse mouth well after using each time.  Albuterol inhaler, 2 puffs every 6hrs only if needed for rescue.  If you continue to have recurrent infections, will need to image sinuses and possibly chest to evaluate for bronchiectasis.  followup with me in 4 weeks.

## 2013-08-22 ENCOUNTER — Ambulatory Visit (INDEPENDENT_AMBULATORY_CARE_PROVIDER_SITE_OTHER): Payer: Medicare Other | Admitting: Podiatry

## 2013-08-22 ENCOUNTER — Encounter: Payer: Self-pay | Admitting: Podiatry

## 2013-08-22 VITALS — BP 128/57 | HR 91 | Resp 18

## 2013-08-22 DIAGNOSIS — M79609 Pain in unspecified limb: Secondary | ICD-10-CM

## 2013-08-22 DIAGNOSIS — B351 Tinea unguium: Secondary | ICD-10-CM

## 2013-08-22 NOTE — Progress Notes (Signed)
  Subjective:    Patient ID: [name], male    DOB: [birth year], 79 y.o.   MRN: [id number]  HPI trim my nails. She presents complaining of painful toenails at approximately three-month intervals.   Review of Systems includes diabetes and sensory neuropathy    Objective:   Physical Exam Orientated x16 79 year old white male. Brittle hypertrophic deformed toenails with palpable tenderness in all nail plates x10.      Assessment & Plan:   Assessment: Symptomatic onychomycoses x10 Diabetic peripheral neuropathy bilaterally.  Plan: Nails x10 are debrided back without any bleeding. Reappoint at three-month intervals.  Richard C.Amalia Hailey, DPM

## 2013-08-25 ENCOUNTER — Ambulatory Visit (INDEPENDENT_AMBULATORY_CARE_PROVIDER_SITE_OTHER): Payer: Medicare Other | Admitting: Pulmonary Disease

## 2013-08-25 ENCOUNTER — Encounter: Payer: Self-pay | Admitting: Pulmonary Disease

## 2013-08-25 VITALS — BP 152/60 | HR 87 | Temp 97.8°F | Ht 70.0 in | Wt 224.4 lb

## 2013-08-25 DIAGNOSIS — J45909 Unspecified asthma, uncomplicated: Secondary | ICD-10-CM

## 2013-08-25 NOTE — Patient Instructions (Signed)
Stay on qvar 2puffs am and pm everyday.  Rinse mouth well, gargle, and also do a rinse to swallow. Will add a spacer to see if we can improve your hoarseness Would like to see you back in 58mos, but call if hoarseness persists or if you have another bout of infection.

## 2013-08-25 NOTE — Progress Notes (Signed)
  Subjective:    Patient ID: [name], male    DOB: 1928/12/25, 79 y.o.   MRN: [id number]  HPI The patient comes in today for followup of his recurrent pulmonary infections.  He was found to have mild airflow obstruction at the last visit, and the working diagnosis is that he may have asthma.  There was also concern for possible sinusitis or bronchiectasis.  The decision was made to treat him with inhaled corticosteroids for a period of time to see if his symptoms recurred.  He has been using Qvar compliantly, however is complaining of dysphonia despite rinsing.  He has not seen any change in his breathing since the last visit, however he has not had a recurrence of his pulmonary issues either.   Review of Systems  Constitutional: Negative for fever and unexpected weight change.  HENT: Positive for voice change. Negative for congestion, dental problem, ear pain, nosebleeds, postnasal drip, rhinorrhea, sinus pressure, sneezing, sore throat and trouble swallowing.   Eyes: Negative for redness and itching.  Respiratory: Positive for cough ( with clear-green mucus prod). Negative for chest tightness, shortness of breath and wheezing.   Cardiovascular: Negative for palpitations and leg swelling.  Gastrointestinal: Negative for nausea and vomiting.  Genitourinary: Negative for dysuria.  Musculoskeletal: Negative for joint swelling.  Skin: Negative for rash.  Neurological: Negative for headaches.  Hematological: Does not bruise/bleed easily.  Psychiatric/Behavioral: Negative for dysphoric mood. The patient is not nervous/anxious.        Objective:   Physical Exam Overweight male in no acute distress Nose without purulence or discharge noted Oropharynx clear Neck without lymphadenopathy or thyromegaly Chest with totally clear breath sounds, no wheezing Cardiac exam with regular rate and rhythm, 2/6 systolic murmur Lower extremities with 1+ ankle edema, no cyanosis Alert and oriented,  moves all 4 extremities.       Assessment & Plan:

## 2013-08-25 NOTE — Assessment & Plan Note (Signed)
The patient has not had a flareup for pulmonary infection over the last 4 weeks since being on Qvar.  It is unclear if this is the answer to his problem, and therefore I would like for him to continue on the medication since we know he has airflow limitation.  If he does not have a recurrence of his pulmonary issues, then this is the answer.  If he does have a recurrence despite being on Qvar, would recommend doing a scan of his sinuses and also a high resolution CT chest to evaluate for bronchiectasis.  The patient is agreeable to this approach.  Will add a spacer to help with his dysphonia.

## 2013-10-07 ENCOUNTER — Telehealth: Payer: Self-pay | Admitting: Pulmonary Disease

## 2013-10-07 MED ORDER — PREDNISONE 20 MG PO TABS: ORAL_TABLET | ORAL | Status: DC

## 2013-10-07 NOTE — Addendum Note (Signed)
Addended by: Raymondo Band D on: 10/07/2013 05:10 PM   Modules accepted: Orders

## 2013-10-07 NOTE — Telephone Encounter (Signed)
Called, spoke with pt.  Reports he woke up this morning with nonprod cough, runny nose with clear drainage, some increased SOB when walking, and some chest tightness.  Symptoms seem to be getting worse as the day goes by.  No f/c/s or body aches.  Is taking qvar; has not needed to use albuterol.  Is requesting further recs given the weekend and no openings in office today.  Dr. Gwenette Greet, pls advise.  Thank you.  [facility]  Last OV with KC: 08/25/13

## 2013-10-07 NOTE — Telephone Encounter (Signed)
Does not sound like an infection currently.  Would like to treat like an asthma flareup. Prednisone 20mg , 40mg /day for 2 days, then 30mg  a day for 2 days, then 20mg  each day for 2 days, then 10mg  each day for 2 days, then stop.

## 2013-10-07 NOTE — Telephone Encounter (Signed)
Called, spoke with pt.  Informed him of below per [facility].  He verbalized understanding of this, is aware rx sent to [facility], and is to call back if symptoms do not improve or worsen.

## 2013-10-12 ENCOUNTER — Telehealth: Payer: Self-pay | Admitting: Pulmonary Disease

## 2013-10-12 MED ORDER — PREDNISONE 20 MG PO TABS: 40.0000 mg | ORAL_TABLET | Freq: Every day | ORAL | Status: DC

## 2013-10-12 MED ORDER — DOXYCYCLINE HYCLATE 100 MG PO TABS: ORAL_TABLET | ORAL | Status: DC

## 2013-10-12 NOTE — Telephone Encounter (Signed)
Pt c/o cough, wheezing yellow-Hillery Zachman mucous x1 week getting worse Rx given of prednisone 10/07/13 Symptoms are worsening--temporary relief with prednisone  Cannot take Keflex d/t side effects or Levaquin d/t not working well. [place] Allergies  Allergen Reactions  . Cephalexin     Dry hives, "violent" diarhea  . Hydrocodone   . Penicillins   . Procaine Hcl   . Sulfonamide Derivatives    Pt would like to be seen today if possible.  Please advise Dr Annamaria Boots as Dr Gwenette Greet is out of office. Thanks.

## 2013-10-12 NOTE — Telephone Encounter (Signed)
Pt advised and rx sent. Jennifer Castillo, CMA  

## 2013-10-12 NOTE — Telephone Encounter (Signed)
Per CY-give Doxycycline 100 mg #8 take 2 today then 1 daily until gone no refills and Prednisone 20 mg #10 take 2 tablets by mouth qd x 5 days no refills(have patient stop his Pred taper rx).

## 2013-11-14 ENCOUNTER — Encounter: Payer: Self-pay | Admitting: Podiatry

## 2013-11-14 ENCOUNTER — Ambulatory Visit (INDEPENDENT_AMBULATORY_CARE_PROVIDER_SITE_OTHER): Payer: Medicare HMO | Admitting: Podiatry

## 2013-11-14 VITALS — BP 118/60 | HR 97 | Resp 18

## 2013-11-14 DIAGNOSIS — B351 Tinea unguium: Secondary | ICD-10-CM

## 2013-11-14 DIAGNOSIS — M79609 Pain in unspecified limb: Secondary | ICD-10-CM

## 2013-11-14 NOTE — Progress Notes (Signed)
   Subjective:    Patient ID: [name], male    DOB: 10-19-1928, 80 y.o.   MRN: [id number]  HPI I need my toenails cut.The last visit for this treatment was 08/22/2013.   Review of Systems     Objective:   Physical Exam  Orientated x3 white male  Dermatological: Hypertrophic, brittle, discolored toenails with palpable tenderness in all nail plates       Assessment & Plan:   Assessment: Symptomatic onychomycoses x10  Plan: All 10 toenails are debrided without any bleeding. Reappoint at three-month intervals

## 2013-12-22 ENCOUNTER — Ambulatory Visit (INDEPENDENT_AMBULATORY_CARE_PROVIDER_SITE_OTHER): Payer: Medicare HMO | Admitting: Pulmonary Disease

## 2013-12-22 ENCOUNTER — Encounter: Payer: Self-pay | Admitting: Pulmonary Disease

## 2013-12-22 VITALS — BP 100/58 | HR 85 | Temp 98.0°F | Ht 70.0 in | Wt 228.0 lb

## 2013-12-22 DIAGNOSIS — J45909 Unspecified asthma, uncomplicated: Secondary | ICD-10-CM

## 2013-12-22 DIAGNOSIS — J310 Chronic rhinitis: Secondary | ICD-10-CM

## 2013-12-22 NOTE — Patient Instructions (Signed)
Stay on qvar for now for your asthma Will add dymista one spray each nostril every am and pm. Please call me in 3 weeks with update on how things are going.  Will decide about followup as well at that time.

## 2013-12-22 NOTE — Progress Notes (Addendum)
   Subjective:    Patient ID: [name], male    DOB: Feb 18, 1929, 80 y.o.   MRN: [id number]  HPI  patient comes in today for followup of his known asthma. He is been using Qvar regularly, but has not seen a big difference in his breathing.  he is complaining of a lot of upper airway symptoms with postnasal drip, rhinorrhea and congestion, and cough    Review of Systems  Constitutional: Negative for fever and unexpected weight change.  HENT: Negative for congestion, dental problem, ear pain, nosebleeds, postnasal drip, rhinorrhea, sinus pressure, sneezing, sore throat and trouble swallowing.   Eyes: Negative for redness and itching.  Respiratory: Positive for shortness of breath and wheezing. Negative for cough and chest tightness.   Cardiovascular: Negative for palpitations and leg swelling.  Gastrointestinal: Negative for nausea and vomiting.  Genitourinary: Negative for dysuria.  Musculoskeletal: Negative for joint swelling.  Skin: Negative for rash.  Neurological: Negative for headaches.  Hematological: Does not bruise/bleed easily.  Psychiatric/Behavioral: Negative for dysphoric mood. The patient is not nervous/anxious.        Objective:   Physical Exam Well-developed male in no acute distress Nose without purulence or discharge noted Neck without lymphadenopathy or thyromegaly Chest without wheezes with adequate airflow Cardiac exam with regular rate Alert and oriented, moves all 4 extremities.       Assessment & Plan:

## 2013-12-22 NOTE — Assessment & Plan Note (Signed)
The patient is staying on Qvar compliantly, and has not required his rescue inhaler. He feels that his breathing and activities of daily living are adequate. I have reviewed with him my goal of treatment, including symptom control and avoiding acute exacerbations.

## 2013-12-22 NOTE — Assessment & Plan Note (Signed)
The patient is having persistent sneezing as well as rhinorrhea and postnasal drip despite trying various antihistamines over-the-counter. This may be contributing to some of his cough and discolored mucus production. I would like to try him on dymista to see if he has significant improvement, and if he continues to produce discolored mucus with ongoing nasal symptoms, he may need a scan of his sinuses. He does have a history of chronic sinusitis dating back to 2004.

## 2014-01-16 ENCOUNTER — Telehealth: Payer: Self-pay | Admitting: Pulmonary Disease

## 2014-01-16 NOTE — Telephone Encounter (Signed)
Pt was seen by [facility] on 12/22/13 with the following instructions:  Patient Instructions     Stay on qvar for now for your asthma  Will add dymista one spray each nostril every am and pm.  Please call me in 3 weeks with update on how things are going. Will decide about followup as well at that time.    ---------  Called, spoke with family member.  Was advised pt is not home right now.  She will ask him to call office back.

## 2014-01-17 NOTE — Telephone Encounter (Signed)
ATC - NA - Will call back 

## 2014-01-17 NOTE — Telephone Encounter (Signed)
Spoke with pt .  He was calling to give update per Dr Gwenette Greet request.  Runny nose and sneezing are much better with Dymista.  Still has occas prod cough with clear mucus.  Denies PND.  Still taking Qvar and Dymista as directed.  Please advise if any other recommendations.

## 2014-01-17 NOTE — Telephone Encounter (Signed)
If he thinks nasal symptoms are better, would stay on dymista.  Would also stay on qvar until next visit.  Would like to see back in 8 weeks to check on things.

## 2014-01-17 NOTE — Telephone Encounter (Signed)
Spouse aware and appt scheduled. Nothing further needed

## 2014-01-20 ENCOUNTER — Telehealth: Payer: Self-pay | Admitting: Pulmonary Disease

## 2014-01-20 MED ORDER — AZELASTINE-FLUTICASONE 137-50 MCG/ACT NA SUSP: 1.0000 | Freq: Two times a day (BID) | NASAL | Status: DC

## 2014-01-20 NOTE — Telephone Encounter (Signed)
Per OV 12/22/13; Patient Instructions      Stay on qvar for now for your asthma Will add dymista one spray each nostril every am and pm. Please call me in 3 weeks with update on how things are going.  Will decide about followup as well at that time  --  Spoke with spouse. She reports the samples of dymista has helped some.  Pt almost out of sample. RX has been sent. Spouse aware and nothing further needed

## 2014-01-24 ENCOUNTER — Telehealth: Payer: Self-pay | Admitting: Pulmonary Disease

## 2014-01-24 NOTE — Telephone Encounter (Signed)
lmomtcb x1 for pt 

## 2014-01-25 MED ORDER — FLUTICASONE PROPIONATE 50 MCG/ACT NA SUSP: 2.0000 | Freq: Every day | NASAL | Status: DC

## 2014-01-25 NOTE — Telephone Encounter (Signed)
Pt returned call.  Spoke with patient and discussed KC's recs with him.  Pt stated that the Dymista did help some but not enough to warrant using the Astepro.  Pt stated okay to try the Flonase and verbalized his understanding on the instructions.  Rx sent to verified pharmacy.  Med list updated.  Pt is aware to call the office should he need anything further.  Nothing further needed; will sign off.

## 2014-01-25 NOTE — Telephone Encounter (Signed)
Pt returned call.  Spoke with patient who stated that he feels that the Dymista has helped "some" and denies any issues at this time with his breathing.  Pt does report that his insurance will not cover the Wylie and was recommended by his pharmacy to try Flonase.  Dr Gwenette Greet, would you like rx sent on Flonase and Astelin or should pt continue with samples of Dymista?  Please advise, thank you.

## 2014-01-25 NOTE — Telephone Encounter (Signed)
If he thinks the dymista really helped, can try astepro 0.15%  1-2 sprays each nostril once or twice a day depending on symptoms If not that much, just flonase 2 each nostril once a day

## 2014-01-25 NOTE — Telephone Encounter (Signed)
lmtcb x1 w/ spouse 

## 2014-01-25 NOTE — Telephone Encounter (Signed)
lmtcb x2 

## 2014-02-06 ENCOUNTER — Encounter: Payer: Self-pay | Admitting: Podiatry

## 2014-02-06 ENCOUNTER — Ambulatory Visit (INDEPENDENT_AMBULATORY_CARE_PROVIDER_SITE_OTHER): Payer: Medicare HMO | Admitting: Podiatry

## 2014-02-06 VITALS — BP 123/64 | HR 68 | Resp 16 | Ht 70.0 in | Wt 220.0 lb

## 2014-02-06 DIAGNOSIS — B351 Tinea unguium: Secondary | ICD-10-CM

## 2014-02-06 DIAGNOSIS — M79609 Pain in unspecified limb: Secondary | ICD-10-CM

## 2014-02-06 NOTE — Progress Notes (Signed)
Subjective:     Patient ID: [name], male   DOB: 11-Jan-1929, 80 y.o.   MRN: [id number]  HPI Comments: Pt presents for debridement 1 - 10 toenails.    Review of Systems     Objective:   Physical Exam Hypertrophic, elongated, brittle, discolored toenails x10    Assessment:      symptomatic onychomycoses Plan:     All 10 toenails are debrided without a bleeding. Reappoint at three-month intervals

## 2014-03-23 ENCOUNTER — Ambulatory Visit (INDEPENDENT_AMBULATORY_CARE_PROVIDER_SITE_OTHER): Payer: Medicare HMO | Admitting: Pulmonary Disease

## 2014-03-23 ENCOUNTER — Encounter: Payer: Self-pay | Admitting: Pulmonary Disease

## 2014-03-23 VITALS — BP 124/52 | HR 89 | Temp 98.1°F | Ht 70.0 in | Wt 220.0 lb

## 2014-03-23 DIAGNOSIS — J45909 Unspecified asthma, uncomplicated: Secondary | ICD-10-CM

## 2014-03-23 DIAGNOSIS — J310 Chronic rhinitis: Secondary | ICD-10-CM

## 2014-03-23 DIAGNOSIS — J309 Allergic rhinitis, unspecified: Secondary | ICD-10-CM

## 2014-03-23 NOTE — Assessment & Plan Note (Signed)
The patient continues to have significant postnasal drip, hoarseness, and a fullness in his throat which leads to cough. He saw only mild improvement with Dymista, and could not get it approved through insurance. He has had allergy evaluation in the past, and felt it did help him. I will refer him back to allergy, and also discussed with him possibly checking a scan of his sinuses. Will wait on his allergy evaluation first and see how he responds.

## 2014-03-23 NOTE — Progress Notes (Signed)
   Subjective:    Patient ID: [name], male    DOB: 12-20-1928, 80 y.o.   MRN: [id number]  HPI The patient comes in today for followup of his known asthma, and significant allergic and chronic rhinitis. He is doing well from an asthma standpoint on inhaled corticosteroids, and has not required his rescue inhaler. He continues to have significant postnasal drip, fullness in his throat for mucus, and a cough. Dymista helped to some degree, but not significantly. The patient has seen an allergist in the past and has responded well to treatment. He denies any significant purulence from his nose.   Review of Systems  Constitutional: Negative for fever and unexpected weight change.  HENT: Positive for rhinorrhea. Negative for congestion, dental problem, ear pain, nosebleeds, postnasal drip, sinus pressure, sneezing, sore throat and trouble swallowing.   Eyes: Negative for redness and itching.  Respiratory: Positive for cough. Negative for chest tightness, shortness of breath and wheezing.   Cardiovascular: Positive for leg swelling. Negative for palpitations.  Gastrointestinal: Positive for nausea. Negative for vomiting.  Genitourinary: Negative for dysuria.  Musculoskeletal: Negative for joint swelling.  Skin: Negative for rash.  Neurological: Negative for headaches.  Hematological: Does not bruise/bleed easily.  Psychiatric/Behavioral: Negative for dysphoric mood. The patient is not nervous/anxious.        Objective:   Physical Exam Overweight male in no acute distress Nose without purulence or discharge noted Neck without lymphadenopathy or thyromegaly Chest totally clear to auscultation, no wheezing Cardiac exam with regular rate and rhythm Lower extremities with minimal edema, no cyanosis Alert and oriented, moves all 4 extremities.       Assessment & Plan:

## 2014-03-23 NOTE — Patient Instructions (Signed)
Stay on qvar with spacer.  Keep rinsing mouth well. Will get you referred back to Dr. Donneta Romberg for an allergy evaluation.  If your hoarseness continues despite your postnasal drip resolving, let me know. followup with me in 67mos.

## 2014-03-23 NOTE — Assessment & Plan Note (Signed)
The patient feels that his asthma symptoms are well-controlled on corticosteroids alone. He has not required his rescue inhaler, and has not had any dyspnea. I've asked him to continue on his Qvar, and to followup with me again in 6 months.

## 2014-04-10 ENCOUNTER — Ambulatory Visit (INDEPENDENT_AMBULATORY_CARE_PROVIDER_SITE_OTHER): Payer: Medicare HMO | Admitting: Internal Medicine

## 2014-04-10 ENCOUNTER — Encounter: Payer: Self-pay | Admitting: Internal Medicine

## 2014-04-10 VITALS — BP 130/60 | HR 76 | Ht 70.0 in | Wt 216.2 lb

## 2014-04-10 DIAGNOSIS — K900 Celiac disease: Secondary | ICD-10-CM

## 2014-04-10 DIAGNOSIS — R1310 Dysphagia, unspecified: Secondary | ICD-10-CM | POA: Insufficient documentation

## 2014-04-10 MED ORDER — SUCRALFATE 1 G PO TABS: 1.0000 g | ORAL_TABLET | Freq: Three times a day (TID) | ORAL | Status: DC

## 2014-04-10 MED ORDER — LIDOCAINE VISCOUS 2 % MT SOLN: 10.0000 mL | Freq: Three times a day (TID) | OROMUCOSAL | Status: DC

## 2014-04-10 MED ORDER — SUCRALFATE 1 GM/10ML PO SUSP: 1.0000 g | Freq: Three times a day (TID) | ORAL | Status: DC

## 2014-04-10 NOTE — Assessment & Plan Note (Addendum)
I think he has pill esophagitis from doxycycline tx w/ carafate and viscous xylocaine and he will call w/ update We decided against egd as no fever or other problems

## 2014-04-10 NOTE — Patient Instructions (Addendum)
We have sent the medications to your pharmacy for you to pick up at your convenience.  Per Dr. Carlean Purl Carafate susp changed to tabs since insurance won't cover the suspension.    Call us back next week with an update, [phone].   I appreciate the opportunity to care for you.

## 2014-04-10 NOTE — Progress Notes (Signed)
Subjective:    Patient ID: [name], male    DOB: 1928-12-02, 80 y.o.   MRN: [id number]  HPI This man is here because of chest pain. He is actually describing odynophagia that began after using doxycycline a few weeks ago. Clindamycin was rxed after that. Tried Omeprazole then Zegerid OTC. Partial relief maybe. Overall he is not better. No hematemesis.  He has celiac disease and tries to be gluten free but admits to indiscretion at restaraunts.  Allergies  Allergen Reactions  . Cephalexin     Dry hives, "violent" diarhea  . Hydrocodone   . Penicillins   . Procaine Hcl   . Sulfonamide Derivatives    Outpatient Prescriptions Prior to Visit  Medication Sig Dispense Refill  . albuterol (PROVENTIL HFA;VENTOLIN HFA) 108 (90 BASE) MCG/ACT inhaler Inhale 2 puffs into the lungs every 6 (six) hours as needed.  1 Inhaler  6  . Alpha-Lipoic Acid 300 MG CAPS Take 1 capsule by mouth daily.        Marland Kitchen amitriptyline (ELAVIL) 75 MG tablet Take 75 mg by mouth daily.        . Ascorbic Acid (VITAMIN C) 1000 MG tablet Take 1,000 mg by mouth 2 (two) times daily.        Marland Kitchen aspirin 81 MG tablet Take 81 mg by mouth daily.        . beclomethasone (QVAR) 80 MCG/ACT inhaler Inhale 2 puffs into the lungs 2 (two) times daily.  1 Inhaler  6  . calcium-vitamin D (OSCAL WITH D) 500-200 MG-UNIT per tablet Take 1 tablet by mouth 3 (three) times daily.        . Cholecalciferol (VITAMIN D3) 1000 UNITS CAPS Take 1 tablet by mouth 3 (three) times daily.       . Coenzyme Q10 (COQ10) 200 MG CAPS Take 1 capsule by mouth daily.        Marland Kitchen levothyroxine (SYNTHROID, LEVOTHROID) 150 MCG tablet Take 150 mcg by mouth daily before breakfast.      . losartan (COZAAR) 100 MG tablet Take 1 tablet by mouth daily.      . magnesium gluconate (MAGONATE) 500 MG tablet Take 500 mg by mouth daily.        . Melatonin 3 MG TABS Take 1 tablet by mouth daily.        . metoprolol succinate (TOPROL-XL) 50 MG 24 hr tablet Take 1 tablet  by mouth daily.      . Multiple Vitamin (MULTIVITAMIN) tablet Take 1 tablet by mouth daily.        . psyllium (REGULOID) 0.52 G capsule Take 6 capsules by mouth 2 (two) times daily.        Marland Kitchen senna (SENOKOT) 8.6 MG tablet Take 1 tablet by mouth daily as needed.         No facility-administered medications prior to visit.   Past Medical History  Diagnosis Date  . Celiac disease   . Iron deficiency anemia   . GERD (gastroesophageal reflux disease)   . REM sleep behavior disorder   . Hypothyroidism   . RLS (restless legs syndrome)   . History of pneumonia     w/pleural effusion  . Asthmatic bronchitis   . Melanoma   . Cellulitis   . Diverticulosis   . Prostate cancer   . Gastric polyps   . DM (diabetes mellitus)   . Atrial fibrillation   . Sleep apnea   . Allergic rhinitis   .  Neuropathy   . Fx. left wrist 1987   Past Surgical History  Procedure Laterality Date  . Appendectomy    . Cholecystectomy  2001  . Prostatectomy  2002  . Foot surgery    . Hernia repair  1994    bilateral  . Lung surgery  2001  . Knee surgery      x 2  . Carpal tunnel release    . Colonoscopy w/ biopsies  04/27/2008    diverticulosis, duodenitis  . Upper gastrointestinal endoscopy  04/27/2008    celiac disease, gastric polyps  . Cataract extraction  03/2011    bilateral    Review of Systems As above. No exertional CP    Objective:   Physical Exam Obese, elderly NAD Chest wall non-tender    Assessment & Plan:  Celiac disease Stable no changes  No need to check TTG Ab as has not made a difference   Odynophagia I think he has pill esophagitis from doxycycline tx w/ carafate and viscous xylocaine and he will call w/ update We decided against egd as no fever or other problems  [id number], ZACH, MD

## 2014-04-10 NOTE — Assessment & Plan Note (Addendum)
Stable no changes  No need to check TTG Ab as has not made a difference

## 2014-04-11 ENCOUNTER — Encounter: Payer: Self-pay | Admitting: Internal Medicine

## 2014-04-17 ENCOUNTER — Telehealth: Payer: Self-pay | Admitting: Internal Medicine

## 2014-04-17 NOTE — Telephone Encounter (Signed)
Patient states he is well. Nothing else to add to this. He makes no further statement of issues.

## 2014-04-17 NOTE — Telephone Encounter (Signed)
Great Take Carafate x 1 month Stop xylocaine whenever he is ready Call back prn

## 2014-04-17 NOTE — Telephone Encounter (Signed)
advised

## 2014-05-08 ENCOUNTER — Telehealth: Payer: Self-pay | Admitting: Pulmonary Disease

## 2014-05-08 NOTE — Telephone Encounter (Signed)
Per OV 03/23/14: Patient Instructions     Stay on qvar with spacer. Keep rinsing mouth well.  Will get you referred back to Dr. Donneta Morgan for an allergy evaluation. If your hoarseness continues despite your postnasal drip resolving, let me know.  followup with me in 69mos  ---  ATC PT. Line rang several times and no answer and no VM. WCB

## 2014-05-09 NOTE — Telephone Encounter (Signed)
LMTC x 1  

## 2014-05-09 NOTE — Telephone Encounter (Signed)
Spoke with the patient  Saw Dr Donneta Romberg 2-3 weeks, was given about 4 different medications Pt is not sure of the names of the medications but knows two of them are nasal sprays. The patient states that his hoarseness is not improving. Would like to wait until Dr Gwenette Greet returns to office for recommendations.  Please advise Dr Gwenette Greet, thanks

## 2014-05-09 NOTE — Telephone Encounter (Signed)
Spoke with pt wife Pt is not available at this time--he is not home Wife states that hte pt's hoarseness is not improving. Saw Dr Donneta Romberg a little over 2 weeks ago and has been on a new medication (not sure of the name) x 2 weeks and it is not helping. Pt was advised last OV with Dr Gwenette Greet to contact our office back if no improvement in symptoms after seeing Dr Donneta Romberg. Wife would like for [place] to speak with the pt to get more information.   Wife to give pt message to contact our office in the morning and to leave a good contact number (cell#) he can be reached right back.

## 2014-05-09 NOTE — Telephone Encounter (Signed)
Pt returned call

## 2014-05-09 NOTE — Telephone Encounter (Signed)
Lm with family for the pt to call back.

## 2014-05-09 NOTE — Telephone Encounter (Signed)
Pt returned triage's call.  Holly D Pryor ° °

## 2014-05-10 NOTE — Telephone Encounter (Signed)
lmtcb x1 

## 2014-05-10 NOTE — Telephone Encounter (Signed)
If he is using a spacer to take his qvar, very unlikely his hoarseness is due to this.  Make sure he is rinsing very well after using qvar.  The most common cause of hoarseness is postnasal drip and reflux.  We can refer him to an ENT specialist to let them look at his voice box and make sure nothing structurally is going on .   I really cannot offer him further advice on postnasal drip other than what an allergy specialist would suggest. He could talk with Dr. Carlean Purl whether he feels reflux could be the issue.  Let me know if he is ok with ENT taking a look at his voice box.

## 2014-05-11 NOTE — Telephone Encounter (Signed)
lmtcb x2 

## 2014-05-12 NOTE — Telephone Encounter (Signed)
Spoke with pt--aware of recs per Dr Gwenette Greet Pt states that he will speak with Dr Carlean Purl first regarding reflux and will then make a decision about the referral to ENT Nothing further needed.

## 2014-05-15 ENCOUNTER — Ambulatory Visit (INDEPENDENT_AMBULATORY_CARE_PROVIDER_SITE_OTHER): Payer: Medicare HMO | Admitting: Podiatry

## 2014-05-15 ENCOUNTER — Encounter: Payer: Self-pay | Admitting: Podiatry

## 2014-05-15 VITALS — BP 120/64 | HR 94 | Resp 12

## 2014-05-15 DIAGNOSIS — M79673 Pain in unspecified foot: Secondary | ICD-10-CM

## 2014-05-15 DIAGNOSIS — M79609 Pain in unspecified limb: Secondary | ICD-10-CM

## 2014-05-15 DIAGNOSIS — B351 Tinea unguium: Secondary | ICD-10-CM

## 2014-05-16 NOTE — Progress Notes (Signed)
Patient ID: [name], male   DOB: 09/12/1929, 80 y.o.   MRN: [id number]  Subjective: This patient presents complaining of painful toenails  Objective: Hypertrophic, discolored, he elongated, incurvated toenails 6-10  Assessment:  symptomatic onychomycosis 6-10  Plan: Debrided toenails x10 without a bleeding  Reappoint at 3 months

## 2014-08-14 ENCOUNTER — Ambulatory Visit (INDEPENDENT_AMBULATORY_CARE_PROVIDER_SITE_OTHER): Payer: Medicare HMO | Admitting: Podiatry

## 2014-08-14 ENCOUNTER — Encounter: Payer: Self-pay | Admitting: Podiatry

## 2014-08-14 DIAGNOSIS — M79676 Pain in unspecified toe(s): Secondary | ICD-10-CM

## 2014-08-14 DIAGNOSIS — B351 Tinea unguium: Secondary | ICD-10-CM

## 2014-08-15 NOTE — Progress Notes (Signed)
Patient ID: [name], male   DOB: 1929-04-13, 80 y.o.   MRN: [id number]  Subjective: Patient presents again complaining of painful toenails  Objective: Elongated, hypertrophic, incurvated, discolored toenails 6-10  Assessment: Symptomatic mycotic toenails 6-10  Plan: Debridement of toenails 10 without a bleeding  Reappoint 3 months

## 2014-09-21 ENCOUNTER — Ambulatory Visit: Payer: Medicare HMO | Admitting: Pulmonary Disease

## 2014-09-25 ENCOUNTER — Other Ambulatory Visit: Payer: Self-pay | Admitting: Dermatology

## 2014-09-26 ENCOUNTER — Encounter: Payer: Self-pay | Admitting: Pulmonary Disease

## 2014-11-13 ENCOUNTER — Encounter: Payer: Self-pay | Admitting: Podiatry

## 2014-11-13 ENCOUNTER — Ambulatory Visit (INDEPENDENT_AMBULATORY_CARE_PROVIDER_SITE_OTHER): Payer: PPO | Admitting: Podiatry

## 2014-11-13 DIAGNOSIS — B351 Tinea unguium: Secondary | ICD-10-CM

## 2014-11-13 DIAGNOSIS — M79676 Pain in unspecified toe(s): Secondary | ICD-10-CM

## 2014-11-13 NOTE — Progress Notes (Signed)
Patient ID: [name], male   DOB: 10-Aug-1929, 81 y.o.   MRN: [id number] Subjective: This patient presents again complaining of painful toenails  Objective: The toenails are elongated, incurvated, discolored, and tender to palpation 6-10  Assessment: Symptomatic onychomycoses 6-10  Plan: Debridement toenails 10 without a bleeding  Reappoint 3 months

## 2015-02-14 ENCOUNTER — Ambulatory Visit (INDEPENDENT_AMBULATORY_CARE_PROVIDER_SITE_OTHER): Payer: PPO | Admitting: Podiatry

## 2015-02-14 ENCOUNTER — Encounter: Payer: Self-pay | Admitting: Podiatry

## 2015-02-14 DIAGNOSIS — B351 Tinea unguium: Secondary | ICD-10-CM | POA: Diagnosis not present

## 2015-02-14 DIAGNOSIS — M79676 Pain in unspecified toe(s): Secondary | ICD-10-CM

## 2015-02-15 NOTE — Progress Notes (Signed)
Patient ID: [name], male   DOB: 12/27/1928, 81 y.o.   MRN: [id number]   Subjective: This patient presents complaining of painful toenails and requesting nail debridement  Objective: The toenails are incurvated, hypertrophic, elongated and tender to palpation 6-10  Assessment: Symptomatic onychomycoses 6-10  Plan: Debridement toenails 10 without any bleeding  Reappoint 3 months

## 2015-05-30 ENCOUNTER — Encounter: Payer: Self-pay | Admitting: Podiatry

## 2015-05-30 ENCOUNTER — Ambulatory Visit (INDEPENDENT_AMBULATORY_CARE_PROVIDER_SITE_OTHER): Payer: PPO | Admitting: Podiatry

## 2015-05-30 DIAGNOSIS — B351 Tinea unguium: Secondary | ICD-10-CM | POA: Diagnosis not present

## 2015-05-30 DIAGNOSIS — M79676 Pain in unspecified toe(s): Secondary | ICD-10-CM

## 2015-05-31 NOTE — Progress Notes (Signed)
Patient ID: [name], male   DOB: April 21, 1929, 81 y.o.   MRN: [id number] Subjective: This patient presents for scheduled visit complaining of painful toenails walking wearing shoes and requests toenail debridement  Objective: The toenails are hypertrophic, elongated, incurvated, discolored and tender direct palpation 6-10  Assessment: Symptomatic onychomycoses 6-10  Plan: Debridement toenails 10 mechanically an electrical without any bleeding  Reappoint 3 months

## 2015-06-14 ENCOUNTER — Encounter (HOSPITAL_COMMUNITY): Payer: Self-pay

## 2015-06-14 ENCOUNTER — Encounter (HOSPITAL_COMMUNITY)
Admission: RE | Admit: 2015-06-14 | Discharge: 2015-06-14 | Disposition: A | Discharge status: Home / Self Care | Payer: PPO | Source: Ambulatory Visit | Attending: Internal Medicine | Admitting: Internal Medicine

## 2015-06-14 DIAGNOSIS — D509 Iron deficiency anemia, unspecified: Secondary | ICD-10-CM | POA: Insufficient documentation

## 2015-06-14 MED ORDER — SODIUM CHLORIDE 0.9 % IV SOLN
510.0000 mg | Freq: Once | INTRAVENOUS | Status: AC
  Administered 2015-06-14: 510 mg via INTRAVENOUS
  Filled 2015-06-14: qty 17

## 2015-06-14 MED ORDER — SODIUM CHLORIDE 0.9 % IV SOLN
INTRAVENOUS | Status: DC
  Administered 2015-06-14: 11:00:00 via INTRAVENOUS

## 2015-06-14 NOTE — Discharge Instructions (Signed)

## 2015-06-19 ENCOUNTER — Ambulatory Visit (HOSPITAL_COMMUNITY): Admission: RE | Admit: 2015-06-19 | Payer: PPO | Source: Ambulatory Visit

## 2015-06-19 ENCOUNTER — Other Ambulatory Visit (HOSPITAL_COMMUNITY): Payer: Self-pay | Admitting: Internal Medicine

## 2015-06-19 ENCOUNTER — Ambulatory Visit (HOSPITAL_COMMUNITY)
Admission: RE | Admit: 2015-06-19 | Discharge: 2015-06-19 | Disposition: A | Discharge status: Home / Self Care | Payer: PPO | Source: Ambulatory Visit | Attending: Internal Medicine | Admitting: Internal Medicine

## 2015-06-19 DIAGNOSIS — R609 Edema, unspecified: Secondary | ICD-10-CM

## 2015-06-20 ENCOUNTER — Ambulatory Visit (HOSPITAL_COMMUNITY): Payer: Medicare HMO | Admitting: Hematology & Oncology

## 2015-08-29 ENCOUNTER — Ambulatory Visit (INDEPENDENT_AMBULATORY_CARE_PROVIDER_SITE_OTHER): Payer: PPO | Admitting: Podiatry

## 2015-08-29 ENCOUNTER — Encounter: Payer: Self-pay | Admitting: Podiatry

## 2015-08-29 DIAGNOSIS — M79676 Pain in unspecified toe(s): Secondary | ICD-10-CM | POA: Diagnosis not present

## 2015-08-29 DIAGNOSIS — B351 Tinea unguium: Secondary | ICD-10-CM

## 2015-08-29 NOTE — Progress Notes (Signed)
Patient ID: [name], male   DOB: 28-Mar-1929, 81 y.o.   MRN: [id number]  Subjective: This patient presents today for scheduled visit complaining of painful toenails and walking wearing shoes and requests toenail debridement  Objective: Orientated 3 No open skin lesions bilaterally The toenails are elongated, hypertrophic, brittle, discolored and tender to direct palpation 6-10  Assessment: Symptomatic onychomycoses 6-10  Plan: Debrided toenails 10 mechanically and electrically without any bleeding  Reappoint 3 month

## 2015-09-13 ENCOUNTER — Encounter: Payer: Self-pay | Admitting: Internal Medicine

## 2015-11-21 DIAGNOSIS — M545 Low back pain: Secondary | ICD-10-CM | POA: Diagnosis not present

## 2015-11-21 DIAGNOSIS — M9905 Segmental and somatic dysfunction of pelvic region: Secondary | ICD-10-CM | POA: Diagnosis not present

## 2015-11-22 DIAGNOSIS — M9905 Segmental and somatic dysfunction of pelvic region: Secondary | ICD-10-CM | POA: Diagnosis not present

## 2015-11-22 DIAGNOSIS — M545 Low back pain: Secondary | ICD-10-CM | POA: Diagnosis not present

## 2015-11-23 DIAGNOSIS — M9905 Segmental and somatic dysfunction of pelvic region: Secondary | ICD-10-CM | POA: Diagnosis not present

## 2015-11-23 DIAGNOSIS — M545 Low back pain: Secondary | ICD-10-CM | POA: Diagnosis not present

## 2015-11-26 DIAGNOSIS — M545 Low back pain: Secondary | ICD-10-CM | POA: Diagnosis not present

## 2015-11-26 DIAGNOSIS — M9905 Segmental and somatic dysfunction of pelvic region: Secondary | ICD-10-CM | POA: Diagnosis not present

## 2015-11-28 DIAGNOSIS — M545 Low back pain: Secondary | ICD-10-CM | POA: Diagnosis not present

## 2015-11-28 DIAGNOSIS — M9905 Segmental and somatic dysfunction of pelvic region: Secondary | ICD-10-CM | POA: Diagnosis not present

## 2015-11-30 DIAGNOSIS — M545 Low back pain: Secondary | ICD-10-CM | POA: Diagnosis not present

## 2015-11-30 DIAGNOSIS — M9905 Segmental and somatic dysfunction of pelvic region: Secondary | ICD-10-CM | POA: Diagnosis not present

## 2015-12-03 DIAGNOSIS — M545 Low back pain: Secondary | ICD-10-CM | POA: Diagnosis not present

## 2015-12-03 DIAGNOSIS — M9905 Segmental and somatic dysfunction of pelvic region: Secondary | ICD-10-CM | POA: Diagnosis not present

## 2015-12-05 ENCOUNTER — Encounter: Payer: Self-pay | Admitting: Podiatry

## 2015-12-05 ENCOUNTER — Ambulatory Visit (INDEPENDENT_AMBULATORY_CARE_PROVIDER_SITE_OTHER): Payer: PPO | Admitting: Podiatry

## 2015-12-05 DIAGNOSIS — M9905 Segmental and somatic dysfunction of pelvic region: Secondary | ICD-10-CM | POA: Diagnosis not present

## 2015-12-05 DIAGNOSIS — M79676 Pain in unspecified toe(s): Secondary | ICD-10-CM | POA: Diagnosis not present

## 2015-12-05 DIAGNOSIS — M545 Low back pain: Secondary | ICD-10-CM | POA: Diagnosis not present

## 2015-12-05 DIAGNOSIS — B351 Tinea unguium: Secondary | ICD-10-CM

## 2015-12-05 NOTE — Progress Notes (Signed)
Patient ID: [name], male   DOB: [birth year], 82 y.o.   MRN: [id number]   Subjective: This patient presents today for scheduled visit complaining of painful toenails and walking wearing shoes and requests toenail debridement  Objective: Orientated 3 No open skin lesions bilaterally The toenails are elongated, hypertrophic, brittle, discolored and tender to direct palpation 6-10  Assessment: Symptomatic onychomycoses 6-10  Plan: Debrided toenails 10 mechanically and electrically without any bleeding  Reappoint 3 month

## 2015-12-06 DIAGNOSIS — E1142 Type 2 diabetes mellitus with diabetic polyneuropathy: Secondary | ICD-10-CM | POA: Diagnosis not present

## 2015-12-06 DIAGNOSIS — E559 Vitamin D deficiency, unspecified: Secondary | ICD-10-CM | POA: Diagnosis not present

## 2015-12-12 DIAGNOSIS — E1142 Type 2 diabetes mellitus with diabetic polyneuropathy: Secondary | ICD-10-CM | POA: Diagnosis not present

## 2015-12-12 DIAGNOSIS — E875 Hyperkalemia: Secondary | ICD-10-CM | POA: Diagnosis not present

## 2015-12-12 DIAGNOSIS — I10 Essential (primary) hypertension: Secondary | ICD-10-CM | POA: Diagnosis not present

## 2015-12-12 DIAGNOSIS — D509 Iron deficiency anemia, unspecified: Secondary | ICD-10-CM | POA: Diagnosis not present

## 2015-12-12 DIAGNOSIS — Z8546 Personal history of malignant neoplasm of prostate: Secondary | ICD-10-CM | POA: Diagnosis not present

## 2015-12-12 DIAGNOSIS — M25569 Pain in unspecified knee: Secondary | ICD-10-CM | POA: Diagnosis not present

## 2015-12-12 DIAGNOSIS — K900 Celiac disease: Secondary | ICD-10-CM | POA: Diagnosis not present

## 2015-12-12 DIAGNOSIS — E782 Mixed hyperlipidemia: Secondary | ICD-10-CM | POA: Diagnosis not present

## 2015-12-12 DIAGNOSIS — G629 Polyneuropathy, unspecified: Secondary | ICD-10-CM | POA: Diagnosis not present

## 2015-12-12 DIAGNOSIS — R1084 Generalized abdominal pain: Secondary | ICD-10-CM | POA: Diagnosis not present

## 2015-12-12 DIAGNOSIS — J42 Unspecified chronic bronchitis: Secondary | ICD-10-CM | POA: Diagnosis not present

## 2016-01-17 DIAGNOSIS — Z961 Presence of intraocular lens: Secondary | ICD-10-CM | POA: Diagnosis not present

## 2016-01-17 DIAGNOSIS — H35363 Drusen (degenerative) of macula, bilateral: Secondary | ICD-10-CM | POA: Diagnosis not present

## 2016-01-30 DIAGNOSIS — J3089 Other allergic rhinitis: Secondary | ICD-10-CM | POA: Diagnosis not present

## 2016-02-19 DIAGNOSIS — E119 Type 2 diabetes mellitus without complications: Secondary | ICD-10-CM | POA: Diagnosis not present

## 2016-02-27 DIAGNOSIS — M5412 Radiculopathy, cervical region: Secondary | ICD-10-CM | POA: Diagnosis not present

## 2016-02-27 DIAGNOSIS — M9901 Segmental and somatic dysfunction of cervical region: Secondary | ICD-10-CM | POA: Diagnosis not present

## 2016-02-29 DIAGNOSIS — M5412 Radiculopathy, cervical region: Secondary | ICD-10-CM | POA: Diagnosis not present

## 2016-02-29 DIAGNOSIS — M9901 Segmental and somatic dysfunction of cervical region: Secondary | ICD-10-CM | POA: Diagnosis not present

## 2016-03-04 DIAGNOSIS — L03115 Cellulitis of right lower limb: Secondary | ICD-10-CM | POA: Diagnosis not present

## 2016-03-12 ENCOUNTER — Encounter: Payer: Self-pay | Admitting: Podiatry

## 2016-03-12 ENCOUNTER — Ambulatory Visit (INDEPENDENT_AMBULATORY_CARE_PROVIDER_SITE_OTHER): Payer: PPO | Admitting: Podiatry

## 2016-03-12 DIAGNOSIS — B351 Tinea unguium: Secondary | ICD-10-CM | POA: Diagnosis not present

## 2016-03-12 DIAGNOSIS — M79676 Pain in unspecified toe(s): Secondary | ICD-10-CM

## 2016-03-13 NOTE — Progress Notes (Signed)
Patient ID: [name], male   DOB: 03/24/1929, 82 y.o.   MRN: [id number]  Subjective: This patient presents today for scheduled visit complaining of painful toenails and walking wearing shoes and requests toenail debridement  Objective: Orientated 3 No open skin lesions bilaterally The toenails are elongated, hypertrophic, brittle, discolored and tender to direct palpation 6-10  Assessment: Symptomatic onychomycoses 6-10  Plan: Debrided toenails 10 mechanically and electrically without any bleeding  Reappoint 3 month

## 2016-03-20 DIAGNOSIS — C61 Malignant neoplasm of prostate: Secondary | ICD-10-CM | POA: Diagnosis not present

## 2016-03-26 DIAGNOSIS — R3915 Urgency of urination: Secondary | ICD-10-CM | POA: Diagnosis not present

## 2016-03-26 DIAGNOSIS — R350 Frequency of micturition: Secondary | ICD-10-CM | POA: Diagnosis not present

## 2016-03-26 DIAGNOSIS — C61 Malignant neoplasm of prostate: Secondary | ICD-10-CM | POA: Diagnosis not present

## 2016-03-26 DIAGNOSIS — R351 Nocturia: Secondary | ICD-10-CM | POA: Diagnosis not present

## 2016-04-24 DIAGNOSIS — I10 Essential (primary) hypertension: Secondary | ICD-10-CM | POA: Diagnosis not present

## 2016-04-24 DIAGNOSIS — E782 Mixed hyperlipidemia: Secondary | ICD-10-CM | POA: Diagnosis not present

## 2016-04-24 DIAGNOSIS — E119 Type 2 diabetes mellitus without complications: Secondary | ICD-10-CM | POA: Diagnosis not present

## 2016-05-01 DIAGNOSIS — R269 Unspecified abnormalities of gait and mobility: Secondary | ICD-10-CM | POA: Diagnosis not present

## 2016-05-01 DIAGNOSIS — I10 Essential (primary) hypertension: Secondary | ICD-10-CM | POA: Diagnosis not present

## 2016-05-01 DIAGNOSIS — K900 Celiac disease: Secondary | ICD-10-CM | POA: Diagnosis not present

## 2016-05-01 DIAGNOSIS — E782 Mixed hyperlipidemia: Secondary | ICD-10-CM | POA: Diagnosis not present

## 2016-05-01 DIAGNOSIS — D509 Iron deficiency anemia, unspecified: Secondary | ICD-10-CM | POA: Diagnosis not present

## 2016-05-01 DIAGNOSIS — E1142 Type 2 diabetes mellitus with diabetic polyneuropathy: Secondary | ICD-10-CM | POA: Diagnosis not present

## 2016-05-01 DIAGNOSIS — R944 Abnormal results of kidney function studies: Secondary | ICD-10-CM | POA: Diagnosis not present

## 2016-05-01 DIAGNOSIS — E875 Hyperkalemia: Secondary | ICD-10-CM | POA: Diagnosis not present

## 2016-05-01 DIAGNOSIS — G9009 Other idiopathic peripheral autonomic neuropathy: Secondary | ICD-10-CM | POA: Diagnosis not present

## 2016-05-14 DIAGNOSIS — M5431 Sciatica, right side: Secondary | ICD-10-CM | POA: Diagnosis not present

## 2016-05-14 DIAGNOSIS — M9903 Segmental and somatic dysfunction of lumbar region: Secondary | ICD-10-CM | POA: Diagnosis not present

## 2016-05-15 DIAGNOSIS — M5431 Sciatica, right side: Secondary | ICD-10-CM | POA: Diagnosis not present

## 2016-05-15 DIAGNOSIS — M9903 Segmental and somatic dysfunction of lumbar region: Secondary | ICD-10-CM | POA: Diagnosis not present

## 2016-05-16 DIAGNOSIS — M5431 Sciatica, right side: Secondary | ICD-10-CM | POA: Diagnosis not present

## 2016-05-16 DIAGNOSIS — M9903 Segmental and somatic dysfunction of lumbar region: Secondary | ICD-10-CM | POA: Diagnosis not present

## 2016-05-19 DIAGNOSIS — M9903 Segmental and somatic dysfunction of lumbar region: Secondary | ICD-10-CM | POA: Diagnosis not present

## 2016-05-19 DIAGNOSIS — M5431 Sciatica, right side: Secondary | ICD-10-CM | POA: Diagnosis not present

## 2016-05-21 ENCOUNTER — Ambulatory Visit (HOSPITAL_COMMUNITY): Payer: PPO | Attending: Dermatology

## 2016-05-21 DIAGNOSIS — R2689 Other abnormalities of gait and mobility: Secondary | ICD-10-CM | POA: Insufficient documentation

## 2016-05-21 DIAGNOSIS — M9903 Segmental and somatic dysfunction of lumbar region: Secondary | ICD-10-CM | POA: Diagnosis not present

## 2016-05-21 DIAGNOSIS — Z9181 History of falling: Secondary | ICD-10-CM | POA: Diagnosis not present

## 2016-05-21 DIAGNOSIS — M5431 Sciatica, right side: Secondary | ICD-10-CM | POA: Diagnosis not present

## 2016-05-21 DIAGNOSIS — R2681 Unsteadiness on feet: Secondary | ICD-10-CM | POA: Insufficient documentation

## 2016-05-21 NOTE — Therapy (Signed)
[place], [place], [address] Phone: [phone]   Fax:  [phone]  Physical Therapy Evaluation  Patient Details  Name: [name] MRN: [id number] Date of Birth: 13-Dec-1928 Referring Provider: Allyn Kenner   Encounter Date: 05/21/2016      PT End of Session - 05/21/16 1602    Visit Number 1   Number of Visits 8  Will trial 4 weeks to assess rehab potential   Date for PT Re-Evaluation 06/21/16   Authorization Type HTA-MCR   Authorization Time Period 05/21/16-07/21/16   Authorization - Visit Number 1   Authorization - Number of Visits 10   PT Start Time 1040   PT Stop Time 1125   PT Time Calculation (min) 45 min   Activity Tolerance Patient tolerated treatment well   Behavior During Therapy [facility] for tasks assessed/performed      Past Medical History:  Diagnosis Date  . Allergic rhinitis   . Asthmatic bronchitis   . Atrial fibrillation ([place])   . Celiac disease   . Cellulitis   . Diverticulosis   . DM (diabetes mellitus) ([place])   . Fx. left wrist 1987  . Gastric polyps   . GERD (gastroesophageal reflux disease)   . History of pneumonia    w/pleural effusion  . Hypothyroidism   . Iron deficiency anemia   . Melanoma ([place])   . Neuropathy ([place])   . Prostate cancer ([place])   . REM sleep behavior disorder   . RLS (restless legs syndrome)   . Sleep apnea     Past Surgical History:  Procedure Laterality Date  . APPENDECTOMY    . CARPAL TUNNEL RELEASE    . CATARACT EXTRACTION  03/2011   bilateral   . CHOLECYSTECTOMY  2001  . COLONOSCOPY W/ BIOPSIES  04/27/2008   diverticulosis, duodenitis  . FOOT SURGERY    . HERNIA REPAIR  1994   bilateral  . KNEE SURGERY     x 2  . LUNG SURGERY  2001  . PROSTATECTOMY  2002  . UPPER GASTROINTESTINAL ENDOSCOPY  04/27/2008   celiac disease, gastric polyps    There were no vitals filed for this visit.       Subjective Assessment - 05/21/16 1041    Subjective  Pt reports he has had some balance issues as of late, intermittent, and not necessarily related to dizziness or room spinning. This started about 1 year ago.    Pertinent History "Pretty bad" preipheral neuropathy, and a "bad" left knee. Has had bilat knee scopes bilat about 8 years, and has been told he would need knee replacements.    How long can you stand comfortably? without AD standing is difficulty.    How long can you walk comfortably? less than 574ft.   More limited by pain in bilat hips and left knee.    Patient Stated Goals improve balance.             [facility] PT Assessment - 05/21/16 0001      Assessment   Medical Diagnosis Balance problems    Referring Provider Allyn Kenner    Onset Date/Surgical Date --  1 YA   Hand Dominance Right   Next MD Visit None scheduled, but seen q3M    Prior Therapy None, prior.      Balance Screen   Has the patient fallen in the past 6 months Yes   How many times? 12   Has the patient had  a decrease in activity level because of a fear of falling?  Yes   Is the patient reluctant to leave their home because of a fear of falling?  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      ROM / Strength   AROM / PROM / Strength AROM     AROM   AROM Assessment Site Cervical   Cervical Flexion 70  Nares to ear canal, assessed to vertical   Cervical Extension 5  Nares to ear canal, assessed to vertical   Cervical - Right Side Bend --  not appropriate due to forward head posture   Cervical - Left Side Bend --  not appropriate due to forward head posture   Cervical - Right Rotation 40   Cervical - Left Rotation 40     Palpation   Spinal mobility advanced thoracic kyphosis     Balance   Balance Assessed Yes      Standardized Balance Assessment   Standardized Balance Assessment Berg Balance Test     Berg Balance Test   Sit to Stand Able to stand  independently using hands   Standing Unsupported Able to stand safely 2 minutes   Sitting with Back Unsupported but Feet Supported on Floor or Stool Able to sit safely and securely 2 minutes   Stand to Sit Sits safely with minimal use of hands   Transfers Able to transfer safely, minor use of hands   Standing Unsupported with Eyes Closed Able to stand 10 seconds with supervision   Standing Ubsupported with Feet Together Able to place feet together independently and stand for 1 minute with supervision   From Standing, Reach Forward with Outstretched Arm Can reach forward >5 cm safely (2")   From Standing Position, Pick up Object from Floor Able to pick up shoe, needs supervision   From Standing Position, Turn to Look Behind Over each Shoulder Turn sideways only but maintains balance   Turn 360 Degrees Needs close supervision or verbal cueing   Standing Unsupported, Alternately Place Feet on Step/Stool Able to complete 4 steps without aid or supervision   Standing Unsupported, One Foot in [place] to take small step independently and hold 30 seconds   Standing on One Leg Unable to try or needs assist to prevent fall   Total Score 37     High Level Balance   High Level Balance Activites Head turns   High Level Balance Comments LOB c vertical and hoizontal headturns                   [facility] Adult PT Treatment/Exercise - 05/21/16 0001      Transfers   Comments Unable to rise from chair without UE assistance     High Level Balance   High Level Balance Activities Other (comment)   High Level Balance Comments Semitandem Stance: 3x30sec at counter (HEP)  Narrow stance: 3x30sec at counter (HEP)                PT Education - 05/21/16 1558    Education provided Yes   Education Details Explained how balance contributions from visual  system, vestibular system, and proprioception work together, and how these are targeted in therapy. Explained how PT will  focus on imrpovement of balance, as well as improved recovery of falls.    Person(s) Educated Patient   Methods Explanation;Demonstration   Comprehension Verbalized understanding          PT Short Term Goals - 05/21/16 1621      PT SHORT TERM GOAL #2   Title After 4 weeks pt will score 42/56 on BBT to demonstrate reduced falls risk.      PT SHORT TERM GOAL #3   Title After 4 weeks pt will improve cervical rotation/extension ROM by 25% to allow for greater vestibular contribution to balance.            PT Long Term Goals - 05/21/16 1621      PT LONG TERM GOAL #3   Title After 4 weeks pt will improve cervical rotation/extension ROM by 50% compared to eval to allow for greater vestibular contribution to balance.    Status New               Plan - 05/21/16 1603    Clinical Impression Statement Pt demonstrating postural limitations including but not limited to moderate progressive thoracic kyphosis, forward head posture, and limited cervical ROM, all of which have been shown in research to contribute to hypofunciton of the vestibular system. Pt demonstrates gross assessment of balance AEB Berg Balance Test score of 37/56, indicative of high risk of falls. Pt reports strong falls history, 12x in last 6 months. Pt demonstrates impairment of funcitonal mobility with decreased gait speed, decreased gait stability, increased LOB during gait with head turns, and impaired ability to rise form a chair without UE assistance. Isolated strength is not assessed in detail at this visit but, pt lacks sufficicent strength to rise from chair without use of arms. 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 Pt demonstrates multiple LOB this session during balance assessment with demonstrate of insufficient  strength to recover from these LOB, and poor proprioceptive awareness of trunk.    Rehab Potential Good   Clinical Impairments Affecting Rehab Potential Chronicity of postural impairment, bilat PND with decreased sensation in feet, pt is not fully convinced that PT will be able to help him, but is open to suggestion.    PT Frequency 1x / week   PT Duration 8 weeks  Trial 4 weeks to assess rehab potential.    PT Treatment/Interventions Functional mobility training;Gait training;Therapeutic exercise;Therapeutic activities;Balance training;Neuromuscular re-education;Patient/family education;Manual techniques;Vestibular   PT Next Visit Plan Review HEP; review goals; 2MWT, 5xSTS; full MMT BLE; Cervical retraction, scapular squeeze.    PT Home Exercise Plan Issued Semi Tandem Stance, narrow stance balance, 5x30sec each.    Consulted and Agree with Plan of Care Patient      Patient will benefit from skilled therapeutic intervention in order to improve the following deficits and impairments:  Abnormal gait, Decreased coordination, Decreased range of motion, Difficulty walking, Pain, Improper body mechanics, Postural dysfunction, Decreased strength, Decreased activity tolerance, Decreased balance  Visit Diagnosis: History of falling - Plan: PT plan of care cert/re-cert  Unsteadiness on feet - Plan: PT plan of care cert/re-cert  Other abnormalities of gait and mobility - Plan: PT plan of care cert/re-cert      G-Codes - [id number]    Functional Assessment Tool Used Clinical Judgment    Functional Limitation Changing and maintaining body position   Changing and Maintaining Body Position Current Status [id number]) At least 60 percent  but less than 80 percent impaired, limited or restricted   Changing and Maintaining Body Position Goal Status [id number]) At least 40 percent but less than 60 percent impaired, limited or restricted       Problem List Patient Active Problem List   Diagnosis Date Noted   . Odynophagia 04/10/2014  . Allergic rhinitis 03/23/2014  . Chronic rhinitis 12/22/2013  . 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 Constipation 08/22/2011  . OBESITY 09/20/2009  . G E R D 11/29/2007  . Celiac disease 11/29/2007    4:26 PM, 05/21/16 Etta Grandchild, PT, DPT Physical Therapist at [place] [phone] (office)     [place], [place], [address] Phone: [phone]   Fax:  [phone]  Name: [name] MRN: [id number] Date of Birth: 10-06-1929

## 2016-05-28 ENCOUNTER — Ambulatory Visit (HOSPITAL_COMMUNITY): Payer: PPO

## 2016-05-28 DIAGNOSIS — R2689 Other abnormalities of gait and mobility: Secondary | ICD-10-CM

## 2016-05-28 DIAGNOSIS — Z9181 History of falling: Secondary | ICD-10-CM

## 2016-05-28 DIAGNOSIS — R2681 Unsteadiness on feet: Secondary | ICD-10-CM

## 2016-05-28 NOTE — Therapy (Signed)
[facility] [address] [place], [place], [address] Phone: [phone]   Fax:  [phone]  Physical Therapy Treatment  Patient Details  Name: [name] MRN: [id number] Date of Birth: 09-30-1929 Referring Provider: Allyn Kenner  Encounter Date: 05/28/2016      PT End of Session - 05/28/16 1111    Visit Number 2   Number of Visits 8   Date for PT Re-Evaluation 06/21/16   Authorization Type HTA-MCR   Authorization Time Period 05/21/16-07/21/16   Authorization - Visit Number 2   Authorization - Number of Visits 10   PT Start Time 1034   PT Stop Time 1112   PT Time Calculation (min) 38 min   Equipment Utilized During Treatment Gait belt   Activity Tolerance Patient tolerated treatment well   Behavior During Therapy [facility] for tasks assessed/performed      Past Medical History:  Diagnosis Date  . Allergic rhinitis   . Asthmatic bronchitis   . Atrial fibrillation ([place])   . Celiac disease   . Cellulitis   . Diverticulosis   . DM (diabetes mellitus) ([place])   . Fx. left wrist 1987  . Gastric polyps   . GERD (gastroesophageal reflux disease)   . History of pneumonia    w/pleural effusion  . Hypothyroidism   . Iron deficiency anemia   . Melanoma ([place])   . Neuropathy ([place])   . Prostate cancer ([place])   . REM sleep behavior disorder   . RLS (restless legs syndrome)   . Sleep apnea     Past Surgical History:  Procedure Laterality Date  . APPENDECTOMY    . CARPAL TUNNEL RELEASE    . CATARACT EXTRACTION  03/2011   bilateral   . CHOLECYSTECTOMY  2001  . COLONOSCOPY W/ BIOPSIES  04/27/2008   diverticulosis, duodenitis  . FOOT SURGERY    . HERNIA REPAIR  1994   bilateral  . KNEE SURGERY     x 2  . LUNG SURGERY  2001  . PROSTATECTOMY  2002  . UPPER GASTROINTESTINAL ENDOSCOPY  04/27/2008   celiac disease, gastric polyps    There were no vitals filed for this visit.      Subjective Assessment - 05/28/16 1031    Subjective  Pt reports he has began HEP, one exercise more challenging with balance activities.  No reports of falls with exercise.              [facility] PT Assessment - 05/28/16 0001      Assessment   Medical Diagnosis Balance problems    Referring Provider Allyn Kenner   Onset Date/Surgical Date --  1 YA   Hand Dominance Right   Next MD Visit None scheduled, but seen q3M    Prior Therapy None, prior.      ROM / Strength   AROM / PROM / Strength Strength       Strength   Right/Left Hip Right;Left   Right Hip Flexion 4/5   Right Hip Extension 2+/5  decreased range   Right Hip ABduction 4/5   Left Hip Flexion 4-/5   Left Hip Extension 2+/5   Left Hip ABduction 4/5   Right Knee Flexion 4-/5   Right Knee Extension 5/5   Left Knee Flexion 3+/5  hip in ER   Left Knee Extension 4+/5   Right Ankle Dorsiflexion 2+/5  decreased ROM   Left Ankle Dorsiflexion 2+/5  decreased ROM     Transfers  Comments 5 STS with UE on lap 15.14"     Ambulation/Gait   Gait Comments 2MWT 288 ft no AD limited by fatigue following                     [facility] Adult PT Treatment/Exercise - 05/28/16 0001      Exercises   Exercises Neck     Neck Exercises: Seated   Neck Retraction 10 reps;3 secs   Neck Retraction Limitations tactile and verbal cueing   Cervical Rotation Both;10 reps;Limitations   Cervical Rotation Limitations 3D cervical excursion   Other Seated Exercise Scapular retraction 10x              Balance Exercises - 05/28/16 1247      Balance Exercises: Standing   Standing Eyes Opened Narrow base of support (BOS);Solid surface;3 reps;30 secs   Tandem Stance Eyes open;3 reps;30 secs             PT Short Term Goals - 05/28/16 1237      PT SHORT TERM GOAL #1   Title After 2 week pt wil demonstrate good compliance with HEP.    Status New     PT SHORT TERM GOAL #2   Title After 4 weeks pt will score 42/56 on BBT to demonstrate reduced falls risk.    Status New      PT SHORT TERM GOAL #3   Title After 4 weeks pt will improve cervical rotation/extension ROM by 25% to allow for greater vestibular contribution to balance.    Status New     PT SHORT TERM GOAL #4   Title After 4 weeks pt will improve MMT in ankle dorsiflexion to 3/5 bilat to improve safety during AMB.    Status New           PT Long Term Goals - 05/28/16 1238      PT LONG TERM GOAL #1   Title After 4 weeks pt wil demonstrate good compliance with HEP.    Status New     PT LONG TERM GOAL #2   Title After 4 weeks pt will score 47/56 on BBT to demonstrate reduced falls risk.    Status New     PT LONG TERM GOAL #3   Title After 4 weeks pt will improve cervical rotation/extension ROM by 50% compared to eval to allow for greater vestibular contribution to balance.    Status New     PT LONG TERM GOAL #4   Title After 8 weeks pt will demonstrate 4/5 or greater in all LE muscles as tested previously (with exception to ankle DF) to imporve functional strength for ADL.    Status New               Plan - 05/28/16 1112    Clinical Impression Statement Reviewed goals, compliance and technique wiht current HEP and copy of eval given to pt.  Further testing complete per PT POC including 2MWT with ability to ambulate 288 feet with no AD, 5 STS in 15.14" with hands on lap and MMT complete for BLE.  Pt educated on importance of proper posture to assist with balance and gait mechanics.  Pt limited by fatigue at end of session, no reports of pain through session.     Clinical Impairments Affecting Rehab Potential Chronicity of postural impairment, bilat PND with decreased sensation in feet, pt is not fully convinced that PT will be able to help him, but is open  to suggestion.    PT Frequency 1x / week   PT Duration 8 weeks   PT Treatment/Interventions Functional mobility training;Gait training;Therapeutic exercise;Therapeutic activities;Balance training;Neuromuscular  re-education;Patient/family education;Manual techniques;Vestibular   PT Next Visit Plan Continue with PT POC to improve cervical mobility, postural strengthening and balance training.   PT Home Exercise Plan Reviewed current HEP: Semi Tandem Stance, narrow stance balance, 5x30sec each.       Patient will benefit from skilled therapeutic intervention in order to improve the following deficits and impairments:  Abnormal gait, Decreased coordination, Decreased range of motion, Difficulty walking, Pain, Improper body mechanics, Postural dysfunction, Decreased strength, Decreased activity tolerance, Decreased balance  Visit Diagnosis: History of falling  Unsteadiness on feet  Other abnormalities of gait and mobility     Problem List Patient Active Problem List   Diagnosis Date Noted  . Odynophagia 04/10/2014  . Allergic rhinitis 03/23/2014  . Chronic rhinitis 12/22/2013  . 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 Constipation 08/22/2011  . OBESITY 09/20/2009  . G E R D 11/29/2007  . Celiac disease 11/29/2007   Ihor Austin, [place]; [place]  Aldona Lento 05/28/2016, 12:48 PM  [facility] [address] [place], [place], [address] Phone: [phone]   Fax:  [phone]  Name: [name] MRN: [id number] Date of Birth: 03-Mar-1929   12:40 PM, 05/30/16 Etta Grandchild, PT, DPT Physical Therapist at [place] [phone] (office)

## 2016-06-04 ENCOUNTER — Ambulatory Visit (HOSPITAL_COMMUNITY): Payer: PPO | Admitting: Physical Therapy

## 2016-06-04 DIAGNOSIS — R2681 Unsteadiness on feet: Secondary | ICD-10-CM

## 2016-06-04 DIAGNOSIS — Z9181 History of falling: Secondary | ICD-10-CM | POA: Diagnosis not present

## 2016-06-04 DIAGNOSIS — R2689 Other abnormalities of gait and mobility: Secondary | ICD-10-CM

## 2016-06-04 NOTE — Therapy (Signed)
[facility] [address] [place], [place], [address] Phone: [phone]   Fax:  [phone]  Physical Therapy Treatment  Patient Details  Name: [name] MRN: [id number] Date of Birth: 18-Dec-1928 Referring Provider: Allyn Kenner  Encounter Date: 06/04/2016      PT End of Session - 06/04/16 1205    Visit Number 3   Number of Visits 8   Date for PT Re-Evaluation 06/21/16   Authorization Type HTA-MCR   Authorization Time Period 05/21/16-07/21/16   Authorization - Visit Number 3   Authorization - Number of Visits 10   PT Start Time 1115   PT Stop Time 1155   PT Time Calculation (min) 40 min   Equipment Utilized During Treatment Gait belt   Activity Tolerance Patient tolerated treatment well   Behavior During Therapy [facility] for tasks assessed/performed      Past Medical History:  Diagnosis Date  . Allergic rhinitis   . Asthmatic bronchitis   . Atrial fibrillation ([place])   . Celiac disease   . Cellulitis   . Diverticulosis   . DM (diabetes mellitus) ([place])   . Fx. left wrist 1987  . Gastric polyps   . GERD (gastroesophageal reflux disease)   . History of pneumonia    w/pleural effusion  . Hypothyroidism   . Iron deficiency anemia   . Melanoma ([place])   . Neuropathy ([place])   . Prostate cancer ([place])   . REM sleep behavior disorder   . RLS (restless legs syndrome)   . Sleep apnea     Past Surgical History:  Procedure Laterality Date  . APPENDECTOMY    . CARPAL TUNNEL RELEASE    . CATARACT EXTRACTION  03/2011   bilateral   . CHOLECYSTECTOMY  2001  . COLONOSCOPY W/ BIOPSIES  04/27/2008   diverticulosis, duodenitis  . FOOT SURGERY    . HERNIA REPAIR  1994   bilateral  . KNEE SURGERY     x 2  . LUNG SURGERY  2001  . PROSTATECTOMY  2002  . UPPER GASTROINTESTINAL ENDOSCOPY  04/27/2008   celiac disease, gastric polyps    There were no vitals filed for this visit.      Subjective Assessment - 06/04/16 1118    Subjective  Pt reports that he has been trying to work on his balance and standing tandem is very difficult to do.    Currently in Pain? Yes   Pain Score 4   Rt lower calf                          [facility] Adult PT Treatment/Exercise - 06/04/16 0001      Exercises   Exercises Knee/Hip     Knee/Hip Exercises: Seated   Other Seated Knee/Hip Exercises seated DF 2x20 each    Sit to Sand 2 sets;10 reps     Knee/Hip Exercises: Supine   Bridges 2 sets;15 reps             Balance Exercises - 06/04/16 1134      Balance Exercises: Standing   Standing Eyes Opened Narrow base of support (BOS);Solid surface;Other (comment);Foam/compliant surface;3 reps;30 secs  head turns x10 reps    Tandem Stance Eyes open;2 reps;20 secs;Other (comment)  x2 reps with foam, intermittent UE support    SLS Eyes open;2 reps;20 secs;Other (comment)  LE propped on bolster    Rockerboard Lateral;Anterior/posterior;Other (comment)  1 UE support, x1 min each  PT Education - 06/04/16 1203    Education provided Yes   Education Details technique with therex; role strength plays in balance; updated HEP    Person(s) Educated Patient   Methods Explanation;Demonstration;Handout   Comprehension Verbalized understanding;Returned demonstration          PT Short Term Goals - 05/28/16 1237      PT SHORT TERM GOAL #1   Title After 2 week pt wil demonstrate good compliance with HEP.    Status New     PT SHORT TERM GOAL #2   Title After 4 weeks pt will score 42/56 on BBT to demonstrate reduced falls risk.    Status New     PT SHORT TERM GOAL #3   Title After 4 weeks pt will improve cervical rotation/extension ROM by 25% to allow for greater vestibular contribution to balance.    Status New     PT SHORT TERM GOAL #4   Title After 4 weeks pt will improve MMT in ankle dorsiflexion to 3/5 bilat to improve safety during AMB.    Status New           PT Long Term Goals - 05/28/16 1238       PT LONG TERM GOAL #1   Title After 4 weeks pt wil demonstrate good compliance with HEP.    Status New     PT LONG TERM GOAL #2   Title After 4 weeks pt will score 47/56 on BBT to demonstrate reduced falls risk.    Status New     PT LONG TERM GOAL #3   Title After 4 weeks pt will improve cervical rotation/extension ROM by 50% compared to eval to allow for greater vestibular contribution to balance.    Status New     PT LONG TERM GOAL #4   Title After 8 weeks pt will demonstrate 4/5 or greater in all LE muscles as tested previously (with exception to ankle DF) to imporve functional strength for ADL.    Status New               Plan - 06/04/16 1206    Clinical Impression Statement Today's session focused on LE strength and balance. Pt with overall good tolerance to activity, however requiring several rest breaks throughout secondary to muscle fatigue. Added sit to stand to his HEP with good understanding of correct technique. Will continue with current POC.   Clinical Impairments Affecting Rehab Potential Chronicity of postural impairment, bilat PND with decreased sensation in feet, pt is not fully convinced that PT will be able to help him, but is open to suggestion.    PT Frequency 1x / week   PT Duration 8 weeks   PT Treatment/Interventions Functional mobility training;Gait training;Therapeutic exercise;Therapeutic activities;Balance training;Neuromuscular re-education;Patient/family education;Manual techniques;Vestibular   PT Next Visit Plan Continue with PT POC to improve cervical mobility, LE strength (ankle specifically), postural strengthening and balance training.   PT Home Exercise Plan current HEP: Semi Tandem Stance, narrow stance balance, 5x30sec each, sit to stand 3x7   Consulted and Agree with Plan of Care Patient      Patient will benefit from skilled therapeutic intervention in order to improve the following deficits and impairments:  Abnormal gait, Decreased  coordination, Decreased range of motion, Difficulty walking, Pain, Improper body mechanics, Postural dysfunction, Decreased strength, Decreased activity tolerance, Decreased balance  Visit Diagnosis: History of falling  Unsteadiness on feet  Other abnormalities of gait and mobility     Problem List  Patient Active Problem List   Diagnosis Date Noted  . Odynophagia 04/10/2014  . Allergic rhinitis 03/23/2014  . Chronic rhinitis 12/22/2013  . 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 Constipation 08/22/2011  . OBESITY 09/20/2009  . G E R D 11/29/2007  . Celiac disease 11/29/2007    12:11 PM,06/04/16 Elly Modena PT, DPT [facility] [place] [address] [place], [place], [address] Phone: [phone]   Fax:  [phone]  Name: VIJAY LISTER MRN: [id number] Date of Birth: [birth year]

## 2016-06-10 ENCOUNTER — Telehealth (HOSPITAL_COMMUNITY): Payer: Self-pay

## 2016-06-10 NOTE — Telephone Encounter (Signed)
8/29 left a message to cx appt, hurt his back and has a hard time getting up and down

## 2016-06-11 ENCOUNTER — Ambulatory Visit (HOSPITAL_COMMUNITY): Payer: PPO

## 2016-06-11 DIAGNOSIS — R0609 Other forms of dyspnea: Secondary | ICD-10-CM | POA: Diagnosis not present

## 2016-06-11 DIAGNOSIS — M545 Low back pain: Secondary | ICD-10-CM | POA: Diagnosis not present

## 2016-06-11 DIAGNOSIS — R2689 Other abnormalities of gait and mobility: Secondary | ICD-10-CM | POA: Diagnosis not present

## 2016-06-11 DIAGNOSIS — E1142 Type 2 diabetes mellitus with diabetic polyneuropathy: Secondary | ICD-10-CM | POA: Diagnosis not present

## 2016-06-11 DIAGNOSIS — M9903 Segmental and somatic dysfunction of lumbar region: Secondary | ICD-10-CM | POA: Diagnosis not present

## 2016-06-11 DIAGNOSIS — R600 Localized edema: Secondary | ICD-10-CM | POA: Diagnosis not present

## 2016-06-17 ENCOUNTER — Other Ambulatory Visit (HOSPITAL_COMMUNITY): Payer: Self-pay | Admitting: Internal Medicine

## 2016-06-17 DIAGNOSIS — R600 Localized edema: Secondary | ICD-10-CM

## 2016-06-18 ENCOUNTER — Encounter: Payer: Self-pay | Admitting: Podiatry

## 2016-06-18 ENCOUNTER — Ambulatory Visit (INDEPENDENT_AMBULATORY_CARE_PROVIDER_SITE_OTHER): Payer: PPO | Admitting: Podiatry

## 2016-06-18 ENCOUNTER — Ambulatory Visit (HOSPITAL_COMMUNITY): Payer: PPO | Attending: Dermatology

## 2016-06-18 DIAGNOSIS — R2681 Unsteadiness on feet: Secondary | ICD-10-CM | POA: Diagnosis not present

## 2016-06-18 DIAGNOSIS — R2689 Other abnormalities of gait and mobility: Secondary | ICD-10-CM | POA: Diagnosis not present

## 2016-06-18 DIAGNOSIS — Z9181 History of falling: Secondary | ICD-10-CM | POA: Diagnosis not present

## 2016-06-18 DIAGNOSIS — B351 Tinea unguium: Secondary | ICD-10-CM

## 2016-06-18 DIAGNOSIS — M79676 Pain in unspecified toe(s): Secondary | ICD-10-CM

## 2016-06-18 NOTE — Progress Notes (Signed)
Patient ID: [name], male   DOB: 1929-04-17, 82 y.o.   MRN: [id number]    Subjective: This patient presents today for scheduled visit complaining of painful toenails and walking wearing shoes and requests toenail debridement  Objective: Orientated 3 DP and PT pulses 2/4 bilaterally Capillary reflex immediate bilaterally No open skin lesions bilaterally The toenails are elongated, hypertrophic, brittle, discolored and tender to direct palpation 6-10  Assessment: Symptomatic onychomycoses 6-10  Plan: Debrided toenails 10 mechanically and electrically without any bleeding  Reappoint 3 month

## 2016-06-18 NOTE — Therapy (Signed)
[facility] [place], [place], [address] Phone: [phone]   Fax:  [phone]  Physical Therapy Re-assessment  Patient Details  Name: [name] MRN: [id number] Date of Birth: [birth year] Referring Provider: Allyn Kenner  Encounter Date: 06/18/2016      PT End of Session - 06/18/16 2220    Visit Number 4   Number of Visits 8   Date for PT Re-Evaluation 06/21/16  Re-Assessment completed 06/18/2016 - encouraged pt to schedule his last 4 visits.    Authorization Type HTA-MCR   Authorization Time Period 05/21/16-07/21/16   Authorization - Visit Number 4   Authorization - Number of Visits 10   PT Start Time 0092   PT Stop Time 1516   PT Time Calculation (min) 43 min   Equipment Utilized During Treatment Gait belt   Activity Tolerance Patient limited by fatigue   Behavior During Therapy WFL for tasks assessed/performed      Past Medical History:  Diagnosis Date  . Allergic rhinitis   . Asthmatic bronchitis   . Atrial fibrillation ([place])   . Celiac disease   . Cellulitis   . Diverticulosis   . DM (diabetes mellitus) ([place])   . Fx. left wrist 1987  . Gastric polyps   . GERD (gastroesophageal reflux disease)   . History of pneumonia    w/pleural effusion  . Hypothyroidism   . Iron deficiency anemia   . Melanoma ([place])   . Neuropathy ([place])   . Prostate cancer ([place])   . REM sleep behavior disorder   . RLS (restless legs syndrome)   . Sleep apnea     Past Surgical History:  Procedure Laterality Date  . APPENDECTOMY    . CARPAL TUNNEL RELEASE    . CATARACT EXTRACTION  03/2011   bilateral   . CHOLECYSTECTOMY  2001  . COLONOSCOPY W/ BIOPSIES  04/27/2008   diverticulosis, duodenitis  . FOOT SURGERY    . HERNIA REPAIR  1994   bilateral  . KNEE SURGERY     x 2  . LUNG SURGERY  2001  . PROSTATECTOMY  2002  . UPPER GASTROINTESTINAL ENDOSCOPY  04/27/2008   celiac disease, gastric polyps    There were no vitals filed  for this visit.      Subjective Assessment - 06/18/16 1434    Subjective Pt states that there may have been a few close calls.  Pt states his R LE has been hurting the last 3 times he fell.  He is scheduled for a US of the R calf tomorrow.  He states he had to cancel the last appointment because he strained a muscle in back working on lawnmower and went to chiropractor and has not had any issues since.     Pertinent History "Pretty bad" preipheral neuropathy, and a "bad" left knee. Has had bilat knee scopes bilat about 8 years, and has been told he would need knee replacements.    How long can you stand comfortably? without AD standing is difficulty.    How long can you walk comfortably? less than 580f.    Currently in Pain? No/denies            [facility] - [facility] 0001      Assessment   Medical Diagnosis Balance problems    Referring Provider JAllyn Kenner  Onset Date/Surgical Date --  1 YA   Hand Dominance Right   Next MD Visit None scheduled, but seen  q3M    Prior Therapy None, prior.      AROM   Cervical Flexion 74   Cervical Extension 49   Cervical - Right Rotation 43   Cervical - Left Rotation 40     Strength   Right Hip Flexion 4+/5   Right Hip ABduction 4/5   Left Hip Flexion 4+/5   Left Hip ABduction 4/5   Right Knee Extension 5/5   Left Knee Extension 5/5   Right Ankle Dorsiflexion 2+/5   Left Ankle Dorsiflexion 2+/5     Transfers   Comments Pt declined - stated he was told by his MD not to perform this exercise due to the "strain on his back muscles" Therefore, unable to test today.      Ambulation/Gait   Gait Comments 3MWT = 231f with 1 standing rest after 2363fdue to SOB and muscular fatigue.  SpO2 97%, HR 97bpm     Berg Balance Test   Sit to Stand Able to stand without using hands and stabilize independently   Standing Unsupported Able to stand safely 2 minutes   Sitting with Back Unsupported but Feet Supported on Floor or Stool Able to sit  safely and securely 2 minutes   Stand to Sit Sits safely with minimal use of hands   Transfers Able to transfer safely, minor use of hands   Standing Unsupported with Eyes Closed Able to stand 10 seconds with supervision   Standing Ubsupported with Feet Together Able to place feet together independently and stand for 1 minute with supervision   From Standing, Reach Forward with Outstretched Arm Can reach forward >5 cm safely (2")   From Standing Position, Pick up Object from Fl[place] pick up shoe, needs supervision   From Standing Position, Turn to Look Behind Over each Shoulder Turn sideways only but maintains balance   Turn 360 Degrees Needs close supervision or verbal cueing   Standing Unsupported, Alternately Place Feet on Step/Stool Able to complete >2 steps/needs minimal assist   Standing Unsupported, One Foot in Front Able to take small step independently and hold 30 seconds   Standing on One Leg Unable to try or needs assist to prevent fall   Total Score 37                             PT Education - 06/18/16 2218    Education provided Yes   Education Details Role of building strength and AROM to assist with balance.   Person(s) Educated Patient   Methods Explanation   Comprehension Verbalized understanding          PT Short Term Goals - 06/18/16 1509      PT SHORT TERM GOAL #1   Title After 2 week pt wil demonstrate good compliance with HEP.    Baseline 9/6: has difficulty with tandem stance.     Status Achieved     PT SHORT TERM GOAL #3   Title After 4 weeks pt will improve cervical rotation/extension ROM by 25% to allow for greater vestibular contribution to balance.    Baseline 9/6: partially met   Status Partially Met     PT SHORT TERM GOAL #4   Title After 4 weeks pt will improve MMT in ankle dorsiflexion to 3/5 bilat to improve safety during AMB.    Status Achieved           PT Long Term Goals - 06/18/16 1511  PT LONG TERM  GOAL #1   Title After 4 weeks pt wil demonstrate good compliance with HEP.    Status Achieved     PT LONG TERM GOAL #2   Title After 4 weeks pt will score 47/56 on BBT to demonstrate reduced falls risk.    Status On-going     PT LONG TERM GOAL #3   Title After 4 weeks pt will improve cervical rotation/extension ROM by 50% compared to eval to allow for greater vestibular contribution to balance.    Status On-going     PT LONG TERM GOAL #4   Title After 8 weeks pt will demonstrate 4/5 or greater in all LE muscles as tested previously (with exception to ankle DF) to imporve functional strength for ADL.    Status Achieved               Plan - 06/18/16 2221    Clinical Impression Statement Re-assessment completed this session.  Pt continues to have balance deficits, however he reports that he has had no falls in the last 6 weeks.  He has had a set back where he injured his back while working on the lawnmower, as well as R LE pain which he is going to have an US performed on tomorrow.  Pt demonstrated improvements with cervical AROM, however continues to have poor posture with fwd head positioning, and increased thoracic kyphosis.  Pt has also had some slight improvements in strength, and endurance.  At this point, pt has a good understanding of his HEP, although he continues to have difficulty with tandem stance.  Pt continues to demonstrate strength and balance deficits that can be improved with further skilled PT to address these deficits.     Rehab Potential Good   Clinical Impairments Affecting Rehab Potential Chronicity of postural impairment, bilat PND with decreased sensation in feet, pt is not fully convinced that PT will be able to help him, but is open to suggestion.    PT Frequency 1x / week   PT Duration 8 weeks   PT Treatment/Interventions Functional mobility training;Gait training;Therapeutic exercise;Therapeutic activities;Balance training;Neuromuscular  re-education;Patient/family education;Manual techniques;Vestibular   PT Next Visit Plan Pt needs general LE and core strengthening exercises added to his HEP.  Continue to progress ankle strength/stability.  Continued encouragement to be diligent with HEP.   PT Home Exercise Plan current HEP: Semi Tandem Stance, narrow stance balance, 5x30sec each, sit to stand 3x7 (removed per MD due to back injury last week -BD 9/6)   Consulted and Agree with Plan of Care Patient      Patient will benefit from skilled therapeutic intervention in order to improve the following deficits and impairments:  Abnormal gait, Decreased coordination, Decreased range of motion, Difficulty walking, Pain, Improper body mechanics, Postural dysfunction, Decreased strength, Decreased activity tolerance, Decreased balance  Visit Diagnosis: History of falling  Unsteadiness on feet  Other abnormalities of gait and mobility     Problem List Patient Active Problem List   Diagnosis Date Noted  . Odynophagia 04/10/2014  . Allergic rhinitis 03/23/2014  . Chronic rhinitis 12/22/2013  . 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 Constipation 08/22/2011  . OBESITY 09/20/2009  . G E R D 11/29/2007  . Celiac disease 11/29/2007    Beth Davionne Dowty, PT, DPT X: [phone]   [facility] [address] [place], [place], [address] Phone: [phone]   Fax:  [phone]  Name: EULICE RUTLEDGE MRN: [id number] Date of Birth: Jan 28, 1929

## 2016-06-19 ENCOUNTER — Ambulatory Visit (HOSPITAL_COMMUNITY)
Admission: RE | Admit: 2016-06-19 | Discharge: 2016-06-19 | Disposition: A | Discharge status: Home / Self Care | Payer: PPO | Source: Ambulatory Visit | Attending: Internal Medicine | Admitting: Internal Medicine

## 2016-06-19 DIAGNOSIS — R600 Localized edema: Secondary | ICD-10-CM

## 2016-06-25 ENCOUNTER — Ambulatory Visit (HOSPITAL_COMMUNITY): Payer: PPO

## 2016-06-25 DIAGNOSIS — Z9181 History of falling: Secondary | ICD-10-CM

## 2016-06-25 DIAGNOSIS — R2681 Unsteadiness on feet: Secondary | ICD-10-CM

## 2016-06-25 DIAGNOSIS — R2689 Other abnormalities of gait and mobility: Secondary | ICD-10-CM

## 2016-06-25 NOTE — Therapy (Signed)
[name], [place], [address] Phone: [phone]   Fax:  [phone]  Physical Therapy Treatment  Patient Details  Name: [name] MRN: [id number] Date of Birth: 29-Oct-1928 Referring Provider: Allyn Kenner  Encounter Date: 06/25/2016      PT End of Session - 06/25/16 1308    Visit Number 5   Number of Visits 8   Date for PT Re-Evaluation 07/16/16   Authorization Type HTA-MCR   Authorization Time Period 05/21/16-07/21/16   Authorization - Visit Number 5   Authorization - Number of Visits 10   PT Start Time 4163   PT Stop Time 1348   PT Time Calculation (min) 44 min   Equipment Utilized During Treatment Gait belt   Activity Tolerance Patient tolerated treatment well;No increased pain;Patient limited by fatigue   Behavior During Therapy [facility] for tasks assessed/performed      Past Medical History:  Diagnosis Date  . Allergic rhinitis   . Asthmatic bronchitis   . Atrial fibrillation ([place])   . Celiac disease   . Cellulitis   . Diverticulosis   . DM (diabetes mellitus) ([place])   . Fx. left wrist 1987  . Gastric polyps   . GERD (gastroesophageal reflux disease)   . History of pneumonia    w/pleural effusion  . Hypothyroidism   . Iron deficiency anemia   . Melanoma ([place])   . Neuropathy ([place])   . Prostate cancer ([place])   . REM sleep behavior disorder   . RLS (restless legs syndrome)   . Sleep apnea     Past Surgical History:  Procedure Laterality Date  . APPENDECTOMY    . CARPAL TUNNEL RELEASE    . CATARACT EXTRACTION  03/2011   bilateral   . CHOLECYSTECTOMY  2001  . COLONOSCOPY W/ BIOPSIES  04/27/2008   diverticulosis, duodenitis  . FOOT SURGERY    . HERNIA REPAIR  1994   bilateral  . KNEE SURGERY     x 2  . LUNG SURGERY  2001  . PROSTATECTOMY  2002  . UPPER GASTROINTESTINAL ENDOSCOPY  04/27/2008   celiac disease, gastric polyps    There were no vitals filed for this visit.       Subjective Assessment - 06/25/16 1307    Subjective Pt stated he is feeling good today, no reports of pain or recent falls since last session.  Reports compliance with HEP and continues to have some difficulty with tandem stance.     Pertinent History "Pretty bad" preipheral neuropathy, and a "bad" left knee. Has had bilat knee scopes bilat about 8 years, and has been told he would need knee replacements.    Patient Stated Goals improve balance.    Currently in Pain? No/denies            [facility] PT Assessment - 06/25/16 0001      Assessment   Medical Diagnosis Balance problems    Referring Provider Allyn Kenner   Onset Date/Surgical Date --  1 YA   Hand Dominance Right   Next MD Visit 07/16/2016 [place]   Prior Therapy None, prior.                      [facility] Adult PT Treatment/Exercise - 06/25/16 0001      Knee/Hip Exercises: Seated   Other Seated Knee/Hip Exercises seated DF and PF 10x3" with RTB  Balance Exercises - 06/25/16 1339      Balance Exercises: Standing   Standing Eyes Opened Narrow base of support (BOS);Solid surface;Foam/compliant surface;3 reps  2 sets with shoulder flexion and rotation with yellow ball    Tandem Stance Eyes open;Foam/compliant surface;3 reps;30 secs  2 sets with airex   SLS Eyes open;2 reps;20 secs;Other (comment)   Wall Bumps Shoulder;Hip  10   Wall Bumps-Shoulders Eyes opened;10 reps   Wall Bumps-Hips Eyes opened;10 reps   Rockerboard Lateral;Anterior/posterior;Other (comment);Intermittent UE support  2 min each    [facility] Rotation Limitations ball rotation on NBOS    Heel Raises Limitations 10  partial cueing for form   Toe Raise Limitations 10x 3"  individual partial             PT Short Term Goals - 06/18/16 1509      PT SHORT TERM GOAL #1   Title After 2 week pt wil demonstrate good compliance with HEP.    Baseline 9/6: has difficulty with tandem stance.     Status Achieved     PT SHORT TERM GOAL  #3   Title After 4 weeks pt will improve cervical rotation/extension ROM by 25% to allow for greater vestibular contribution to balance.    Baseline 9/6: partially met   Status Partially Met     PT SHORT TERM GOAL #4   Title After 4 weeks pt will improve MMT in ankle dorsiflexion to 3/5 bilat to improve safety during AMB.    Status Achieved           PT Long Term Goals - 06/18/16 1511      PT LONG TERM GOAL #1   Title After 4 weeks pt wil demonstrate good compliance with HEP.    Status Achieved     PT LONG TERM GOAL #2   Title After 4 weeks pt will score 47/56 on BBT to demonstrate reduced falls risk.    Status On-going     PT LONG TERM GOAL #3   Title After 4 weeks pt will improve cervical rotation/extension ROM by 50% compared to eval to allow for greater vestibular contribution to balance.    Status On-going     PT LONG TERM GOAL #4   Title After 8 weeks pt will demonstrate 4/5 or greater in all LE muscles as tested previously (with exception to ankle DF) to imporve functional strength for ADL.    Status Achieved               Plan - 06/25/16 1513    Clinical Impression Statement Session focus on improving LE and core strengthening to improve balance deficits.  Pt demosntrated increased posterior leaning this session with over head motions requiring min to mod A for safety.  Pt displays with thoracic kyphotic posture through session with moderate cueing required to look up.  Pt explained importance of improving posture to assist with balance.  Added hip and shoulder wall bumps this session to increase spatial awarness and increase core activaiion wiht posterior leans for strengthening.  Added ankle strengthening exercises as well to POC to improve strengthening with gait mechanics.  EOS no reports of pain, pt was limited by fatigue.     Rehab Potential Good   Clinical Impairments Affecting Rehab Potential Chronicity of postural impairment, bilat PND with decreased  sensation in feet, pt is not fully convinced that PT will be able to help him, but is open to suggestion.    PT Frequency 1x /  week   PT Duration 8 weeks   PT Treatment/Interventions Functional mobility training;Gait training;Therapeutic exercise;Therapeutic activities;Balance training;Neuromuscular re-education;Patient/family education;Manual techniques;Vestibular   PT Next Visit Plan Pt needs general LE and core strengthening exercises added to his HEP.  Continue to progress ankle strength/stability.  Continued encouragement to be diligent with HEP.  Next session add 3D thoracic excursion and give as HEP is able to complete correct form and technique to improve posture to assist with balance.     PT Home Exercise Plan current HEP: Semi Tandem Stance, narrow stance balance, 5x30sec each, sit to stand 3x7 (removed per MD due to back injury last week -BD 9/6)      Patient will benefit from skilled therapeutic intervention in order to improve the following deficits and impairments:  Abnormal gait, Decreased coordination, Decreased range of motion, Difficulty walking, Pain, Improper body mechanics, Postural dysfunction, Decreased strength, Decreased activity tolerance, Decreased balance  Visit Diagnosis: History of falling  Unsteadiness on feet  Other abnormalities of gait and mobility     Problem List Patient Active Problem List   Diagnosis Date Noted  . Odynophagia 04/10/2014  . Allergic rhinitis 03/23/2014  . Chronic rhinitis 12/22/2013  . 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 Constipation 08/22/2011  . OBESITY 09/20/2009  . G E R D 11/29/2007  . Celiac disease 11/29/2007   Ihor Austin, [place]; [place]  Aldona Lento 06/25/2016, 3:29 PM  [facility] [address] [place], [place], [address] Phone: [phone]   Fax:  [phone]  Name: HANISH LARAIA MRN: [id number] Date of Birth: [birth year]

## 2016-07-02 ENCOUNTER — Ambulatory Visit (HOSPITAL_COMMUNITY): Payer: PPO | Admitting: Physical Therapy

## 2016-07-02 DIAGNOSIS — Z9181 History of falling: Secondary | ICD-10-CM | POA: Diagnosis not present

## 2016-07-02 DIAGNOSIS — R2689 Other abnormalities of gait and mobility: Secondary | ICD-10-CM

## 2016-07-02 DIAGNOSIS — R2681 Unsteadiness on feet: Secondary | ICD-10-CM

## 2016-07-02 NOTE — Therapy (Signed)
[facility] [place], [place], [address] Phone: [phone]   Fax:  [phone]  Physical Therapy Treatment  Patient Details  Name: [name] MRN: [id number] Date of Birth: 12-24-1928 Referring Provider: Allyn Kenner  Encounter Date: 07/02/2016      PT End of Session - 07/02/16 1828    Visit Number 6   Number of Visits 8   Date for PT Re-Evaluation 07/16/16   Authorization Type HTA-MCR   Authorization Time Period 05/21/16-07/21/16   Authorization - Visit Number 6   Authorization - Number of Visits 10   PT Start Time 6767   PT Stop Time 1156   PT Time Calculation (min) 38 min   Equipment Utilized During Treatment Gait belt   Activity Tolerance Patient tolerated treatment well;No increased pain;Patient limited by fatigue   Behavior During Therapy [facility] for tasks assessed/performed      Past Medical History:  Diagnosis Date  . Allergic rhinitis   . Asthmatic bronchitis   . Atrial fibrillation ([place])   . Celiac disease   . Cellulitis   . Diverticulosis   . DM (diabetes mellitus) ([place])   . Fx. left wrist 1987  . Gastric polyps   . GERD (gastroesophageal reflux disease)   . History of pneumonia    w/pleural effusion  . Hypothyroidism   . Iron deficiency anemia   . Melanoma ([place])   . Neuropathy ([place])   . Prostate cancer ([place])   . REM sleep behavior disorder   . RLS (restless legs syndrome)   . Sleep apnea     Past Surgical History:  Procedure Laterality Date  . APPENDECTOMY    . CARPAL TUNNEL RELEASE    . CATARACT EXTRACTION  03/2011   bilateral   . CHOLECYSTECTOMY  2001  . COLONOSCOPY W/ BIOPSIES  04/27/2008   diverticulosis, duodenitis  . FOOT SURGERY    . HERNIA REPAIR  1994   bilateral  . KNEE SURGERY     x 2  . LUNG SURGERY  2001  . PROSTATECTOMY  2002  . UPPER GASTROINTESTINAL ENDOSCOPY  04/27/2008   celiac disease, gastric polyps    There were no vitals filed for this visit.       Subjective Assessment - 07/02/16 1828    Subjective Pt reports compliance with his HEP and is having no pain currently.    Currently in Pain? No/denies                         [facility] Adult PT Treatment/Exercise - 07/02/16 0001      Neck Exercises: Standing   Other Standing Exercises (P)  hip abduction/extension 10 reps each              Balance Exercises - 07/02/16 1130      Balance Exercises: Standing   Standing Eyes Opened Narrow base of support (BOS);Solid surface;Foam/compliant surface;3 reps   Tandem Stance Eyes open;Foam/compliant surface;3 reps;30 secs   SLS Eyes open;2 reps;20 secs;Other (comment)   Wall Bumps Shoulder;Hip   Wall Bumps-Shoulders Eyes opened;10 reps   Wall Bumps-Hips Eyes opened;10 reps   Rockerboard Lateral;Anterior/posterior;Other (comment);Intermittent UE support   Tandem Gait 1 rep   Retro Gait 1 rep   Sidestepping 1 rep   Heel Raises Limitations 10   Toe Raise Limitations 10x 3"  seated   Sit to Stand Time 5 reps no UEs   Other Standing Exercises stand on foam with  UE flexion using yellow ball 10X then rotations 10X             PT Short Term Goals - 06/18/16 1509      PT SHORT TERM GOAL #1   Title After 2 week pt wil demonstrate good compliance with HEP.    Baseline 9/6: has difficulty with tandem stance.     Status Achieved     PT SHORT TERM GOAL #3   Title After 4 weeks pt will improve cervical rotation/extension ROM by 25% to allow for greater vestibular contribution to balance.    Baseline 9/6: partially met   Status Partially Met     PT SHORT TERM GOAL #4   Title After 4 weeks pt will improve MMT in ankle dorsiflexion to 3/5 bilat to improve safety during AMB.    Status Achieved           PT Long Term Goals - 06/18/16 1511      PT LONG TERM GOAL #1   Title After 4 weeks pt wil demonstrate good compliance with HEP.    Status Achieved     PT LONG TERM GOAL #2   Title After 4 weeks pt will score  47/56 on BBT to demonstrate reduced falls risk.    Status On-going     PT LONG TERM GOAL #3   Title After 4 weeks pt will improve cervical rotation/extension ROM by 50% compared to eval to allow for greater vestibular contribution to balance.    Status On-going     PT LONG TERM GOAL #4   Title After 8 weeks pt will demonstrate 4/5 or greater in all LE muscles as tested previously (with exception to ankle DF) to imporve functional strength for ADL.    Status Achieved               Plan - 07/02/16 1831    Clinical Impression Statement Continued with primary focus on balance and LE strengthening.  Added standing hip abd/extension and sit to stand activity.  Pt required cues to complete in correct form, isolating abductors with hip abduction.  Also added dynamic balance actvities.  Therex of most difficulty included shoulder wall bumps and tandem gait.  Pt was unable to perform toeraises in standing due to weak TA, so modified to seated position.    Rehab Potential Good   PT Frequency 1x / week   PT Duration 8 weeks   PT Treatment/Interventions Functional mobility training;Gait training;Therapeutic exercise;Therapeutic activities;Balance training;Neuromuscular re-education;Patient/family education;Manual techniques;Vestibular   PT Next Visit Plan Continue to progress ankle strength/stability. Next session add 3D thoracic excursion and give as HEP    PT Home Exercise Plan current HEP: Semi Tandem Stance, narrow stance balance, 5x30sec each, sit to stand 3x7 (removed per MD due to back injury last week -BD 9/6)      Patient will benefit from skilled therapeutic intervention in order to improve the following deficits and impairments:  Abnormal gait, Decreased coordination, Decreased range of motion, Difficulty walking, Pain, Improper body mechanics, Postural dysfunction, Decreased strength, Decreased activity tolerance, Decreased balance  Visit Diagnosis: History of falling  Unsteadiness  on feet  Other abnormalities of gait and mobility     Problem List Patient Active Problem List   Diagnosis Date Noted  . Odynophagia 04/10/2014  . Allergic rhinitis 03/23/2014  . Chronic rhinitis 12/22/2013  . 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 Constipation 08/22/2011  . OBESITY 09/20/2009  . G E R D 11/29/2007  . Celiac disease  11/29/2007    Teena Irani, PTA/CLT [phone]  07/02/2016, 6:38 PM  [facility] [address] [place], [place], [address] Phone: [phone]   Fax:  [phone]  Name: [name] MRN: [id number] Date of Birth: 02/27/1929

## 2016-07-08 ENCOUNTER — Telehealth: Payer: Self-pay | Admitting: Cardiovascular Disease

## 2016-07-08 NOTE — Telephone Encounter (Signed)
Records received from Wende Neighbors MD for apt on 07/25/16 with Dr Gwenlyn Found. Records given to [employer] (medical records) CN

## 2016-07-09 ENCOUNTER — Ambulatory Visit (HOSPITAL_COMMUNITY): Payer: PPO

## 2016-07-09 ENCOUNTER — Encounter (HOSPITAL_COMMUNITY): Payer: Self-pay

## 2016-07-09 DIAGNOSIS — Z9181 History of falling: Secondary | ICD-10-CM | POA: Diagnosis not present

## 2016-07-09 DIAGNOSIS — R2689 Other abnormalities of gait and mobility: Secondary | ICD-10-CM

## 2016-07-09 DIAGNOSIS — R2681 Unsteadiness on feet: Secondary | ICD-10-CM

## 2016-07-09 NOTE — Therapy (Signed)
[place], [place], [address] Phone: [phone]   Fax:  [phone]  Physical Therapy Treatment  Patient Details  Name: [name] MRN: [id number] Date of Birth: [birth year] Referring Provider: Allyn Kenner  Encounter Date: 07/09/2016      PT End of Session - 07/09/16 1223    Visit Number 7   Number of Visits 8   Date for PT Re-Evaluation 07/16/16   Authorization Type [id number]-MCR   Authorization Time Period 05/21/16-07/21/16   Authorization - Visit Number 7   Authorization - Number of Visits 10   PT Start Time 1120   PT Stop Time 0017   PT Time Calculation (min) 39 min   Equipment Utilized During Treatment Gait belt   Activity Tolerance Patient tolerated treatment well;No increased pain;Patient limited by fatigue   Behavior During Therapy [facility] for tasks assessed/performed      Past Medical History:  Diagnosis Date  . Allergic rhinitis   . Asthmatic bronchitis   . Atrial fibrillation ([place])   . Celiac disease   . Cellulitis   . Diverticulosis   . DM (diabetes mellitus) ([place])   . Fx. left wrist 1987  . Gastric polyps   . GERD (gastroesophageal reflux disease)   . History of pneumonia    w/pleural effusion  . Hypothyroidism   . Iron deficiency anemia   . Melanoma ([place])   . Neuropathy ([place])   . Prostate cancer ([place])   . REM sleep behavior disorder   . RLS (restless legs syndrome)   . Sleep apnea     Past Surgical History:  Procedure Laterality Date  . APPENDECTOMY    . CARPAL TUNNEL RELEASE    . CATARACT EXTRACTION  03/2011   bilateral   . CHOLECYSTECTOMY  2001  . COLONOSCOPY W/ BIOPSIES  04/27/2008   diverticulosis, duodenitis  . FOOT SURGERY    . HERNIA REPAIR  1994   bilateral  . KNEE SURGERY     x 2  . LUNG SURGERY  2001  . PROSTATECTOMY  2002  . UPPER GASTROINTESTINAL ENDOSCOPY  04/27/2008   celiac disease, gastric polyps    There were no vitals filed for this visit.       Subjective Assessment - 07/09/16 1124    Subjective Pt reports he is doing well. He continues to work on his HEP when he can. He thinks that he is making progress in his balalnce but it requires somewhat limited.    Pertinent History "Pretty bad" preipheral neuropathy, and a "bad" left knee. Has had bilat knee scopes bilat about 8 years, and has been told he would need knee replacements.                          [facility] Adult PT Treatment/Exercise - 07/09/16 0001      Neck Exercises: Supine   Other Supine Exercise supine flat 2 pillows, T-spine ext stretch  2 minutes; + crvical retraction 1x10x3sH             Balance Exercises - 07/09/16 1216      Balance Exercises: Standing   Standing Eyes Opened Narrow base of support (BOS);Solid surface;Cognitive challenge  self ball toss 1x20; rotation c ball 1x20 bilat   Rebounder Double leg;Static;Other (comment)  narrow stance: volley wall    Tandem Gait --  220' fwd gait, grass/unlevel/uneven   Retro Gait Other reps (comment)  110' grass/unlevel/uneven  Sidestepping --  1x55' bilat bilat; grass/unlevel/uneven           PT Education - 07/09/16 1222    Education provided Yes   Education Details educated on importance of arresting development of T-spine kyphosis.    Person(s) Educated Patient   Methods Explanation;Demonstration   Comprehension Verbalized understanding          PT Short Term Goals - 06/18/16 1509      PT SHORT TERM GOAL #1   Title After 2 week pt wil demonstrate good compliance with HEP.    Baseline 9/6: has difficulty with tandem stance.     Status Achieved     PT SHORT TERM GOAL #3   Title After 4 weeks pt will improve cervical rotation/extension ROM by 25% to allow for greater vestibular contribution to balance.    Baseline 9/6: partially met   Status Partially Met     PT SHORT TERM GOAL #4   Title After 4 weeks pt will improve MMT in ankle dorsiflexion to 3/5 bilat to improve  safety during AMB.    Status Achieved           PT Long Term Goals - 06/18/16 1511      PT LONG TERM GOAL #1   Title After 4 weeks pt wil demonstrate good compliance with HEP.    Status Achieved     PT LONG TERM GOAL #2   Title After 4 weeks pt will score 47/56 on BBT to demonstrate reduced falls risk.    Status On-going     PT LONG TERM GOAL #3   Title After 4 weeks pt will improve cervical rotation/extension ROM by 50% compared to eval to allow for greater vestibular contribution to balance.    Status On-going     PT LONG TERM GOAL #4   Title After 8 weeks pt will demonstrate 4/5 or greater in all LE muscles as tested previously (with exception to ankle DF) to imporve functional strength for ADL.    Status Achieved               Plan - 07/09/16 1224    Clinical Impression Statement Session tolerates well, but patient easily fatiged after 100' AMB, requiring 2-3 minutes recovery to avoid 'legs giving out.' During exertion his left radial HR is noted to be irregular, fluctuating between 60bpm adn 120bpm,with overal counter 110 beats in 60seconds. Pt reports he is not currenrtly followed by cardiology but is on metropolol and has an appointment with cardiology in October. He denies any knowledge of afib diagnosis. Multiple LOB throughout session, mostly with retropulsion likely exacerbated by chronic ankle limitations. Pt continues to demonstrate excessive toe-out (like compensation for poor lateral hip stability) and audible foot drop bilat (which pt reports his doctor relates to his neuropathy. VC adn tactile cues given for correction of cervical flexion and down gazing, with improved postural correction, howevere kyphosis is well developed and rigid. (patient has slept overnight in a recliner for 5+ years.) Pt making progress toward goals with improved balance and improved recovery from LOB.    Rehab Potential Good   Clinical Impairments Affecting Rehab Potential Chronicity of  postural impairment, bilat PND with decreased sensation in feet, pt is not fully convinced that PT will be able to help him, but is open to suggestion.    PT Frequency 1x / week   PT Duration 8 weeks   PT Treatment/Interventions Functional mobility training;Gait training;Therapeutic exercise;Therapeutic activities;Balance training;Neuromuscular re-education;Patient/family education;Manual techniques;Vestibular  PT Next Visit Plan Continue to progress ankle strength/stability.    PT Home Exercise Plan current HEP: Semi Tandem Stance, narrow stance balance, 5x30sec each, sit to stand 3x7 (removed per MD due to back injury last week -BD 9/6); 9/27 (2-3' supine on bed for T-spine extension, + 1x10x3secH retraction into pillow, still needs pictures if these go well.       Patient will benefit from skilled therapeutic intervention in order to improve the following deficits and impairments:  Abnormal gait, Decreased coordination, Decreased range of motion, Difficulty walking, Pain, Improper body mechanics, Postural dysfunction, Decreased strength, Decreased activity tolerance, Decreased balance  Visit Diagnosis: History of falling  Unsteadiness on feet  Other abnormalities of gait and mobility     Problem List Patient Active Problem List   Diagnosis Date Noted  . Odynophagia 04/10/2014  . Allergic rhinitis 03/23/2014  . Chronic rhinitis 12/22/2013  . 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 Constipation 08/22/2011  . OBESITY 09/20/2009  . G E R D 11/29/2007  . Celiac disease 11/29/2007    12:34 PM, 07/09/16 Etta Grandchild, PT, DPT Physical Therapist at [facility] [phone] (office)     [place], [place], [address] Phone: [phone]   Fax:  [phone]  Name: [name] MRN: [id number] Date of Birth: 1929/09/28

## 2016-07-11 DIAGNOSIS — E1142 Type 2 diabetes mellitus with diabetic polyneuropathy: Secondary | ICD-10-CM | POA: Diagnosis not present

## 2016-07-11 DIAGNOSIS — E559 Vitamin D deficiency, unspecified: Secondary | ICD-10-CM | POA: Diagnosis not present

## 2016-07-11 DIAGNOSIS — Z125 Encounter for screening for malignant neoplasm of prostate: Secondary | ICD-10-CM | POA: Diagnosis not present

## 2016-07-11 DIAGNOSIS — D509 Iron deficiency anemia, unspecified: Secondary | ICD-10-CM | POA: Diagnosis not present

## 2016-07-16 ENCOUNTER — Ambulatory Visit (HOSPITAL_COMMUNITY): Payer: PPO | Attending: Dermatology | Admitting: Physical Therapy

## 2016-07-16 DIAGNOSIS — R2689 Other abnormalities of gait and mobility: Secondary | ICD-10-CM

## 2016-07-16 DIAGNOSIS — K900 Celiac disease: Secondary | ICD-10-CM | POA: Diagnosis not present

## 2016-07-16 DIAGNOSIS — R2681 Unsteadiness on feet: Secondary | ICD-10-CM | POA: Insufficient documentation

## 2016-07-16 DIAGNOSIS — R944 Abnormal results of kidney function studies: Secondary | ICD-10-CM | POA: Diagnosis not present

## 2016-07-16 DIAGNOSIS — G9009 Other idiopathic peripheral autonomic neuropathy: Secondary | ICD-10-CM | POA: Diagnosis not present

## 2016-07-16 DIAGNOSIS — I10 Essential (primary) hypertension: Secondary | ICD-10-CM | POA: Diagnosis not present

## 2016-07-16 DIAGNOSIS — E782 Mixed hyperlipidemia: Secondary | ICD-10-CM | POA: Diagnosis not present

## 2016-07-16 DIAGNOSIS — E875 Hyperkalemia: Secondary | ICD-10-CM | POA: Diagnosis not present

## 2016-07-16 DIAGNOSIS — I499 Cardiac arrhythmia, unspecified: Secondary | ICD-10-CM | POA: Diagnosis not present

## 2016-07-16 DIAGNOSIS — E1142 Type 2 diabetes mellitus with diabetic polyneuropathy: Secondary | ICD-10-CM | POA: Diagnosis not present

## 2016-07-16 DIAGNOSIS — Z9181 History of falling: Secondary | ICD-10-CM | POA: Diagnosis not present

## 2016-07-16 DIAGNOSIS — M25569 Pain in unspecified knee: Secondary | ICD-10-CM | POA: Diagnosis not present

## 2016-07-16 DIAGNOSIS — J40 Bronchitis, not specified as acute or chronic: Secondary | ICD-10-CM | POA: Diagnosis not present

## 2016-07-16 DIAGNOSIS — D509 Iron deficiency anemia, unspecified: Secondary | ICD-10-CM | POA: Diagnosis not present

## 2016-07-16 NOTE — Patient Instructions (Addendum)
Flexibility: Neck Retraction    Pull head straight back, keeping eyes and jaw level. Repeat ____ times per set. Do ____ sets per session. Do ____ sessions per day.  http://orth.exer.us/344   Copyright  VHI. All rights reserved.  Scapular Retraction (Standing)    With arms at sides, pinch shoulder blades together. Repeat ____ times per set. Do ____ sets per session. Do ____ sessions per day.  http://orth.exer.us/944   Copyright  VHI. All rights reserved.  Balance: Unilateral   With one hand on kitchen counter  Attempt to balance on left leg, eyes open. Hold _10___ seconds. Repeat _3___ times per set. Do ___1_ sets per session. Do _2___ sessions per day. Repeat to right   http://orth.exer.us/28   Copyright  VHI. All rights reserved.

## 2016-07-16 NOTE — Therapy (Signed)
[facility] [address] [place], [place], [address] Phone: [phone]   Fax:  [phone]  Physical Therapy Treatment  Patient Details  Name: [name] MRN: [id number] Date of Birth: 05-Nov-1928 Referring Provider: Allyn Kenner   Encounter Date: 07/16/2016 Discharge     PT End of Session - 07/16/16 1219    Visit Number 8   Number of Visits 8   Date for PT Re-Evaluation 07/16/16   Authorization Type HTA-MCR   Authorization Time Period 05/21/16-07/21/16   Authorization - Visit Number 8   Authorization - Number of Visits 8   PT Start Time 0950   PT Stop Time 1030   PT Time Calculation (min) 40 min   Activity Tolerance Patient tolerated treatment well;No increased pain;Patient limited by fatigue   Behavior During Therapy [facility] for tasks assessed/performed      Past Medical History:  Diagnosis Date  . Allergic rhinitis   . Asthmatic bronchitis   . Atrial fibrillation ([place])   . Celiac disease   . Cellulitis   . Diverticulosis   . DM (diabetes mellitus) ([place])   . Fx. left wrist 1987  . Gastric polyps   . GERD (gastroesophageal reflux disease)   . History of pneumonia    w/pleural effusion  . Hypothyroidism   . Iron deficiency anemia   . Melanoma ([place])   . Neuropathy ([place])   . Prostate cancer ([place])   . REM sleep behavior disorder   . RLS (restless legs syndrome)   . Sleep apnea     Past Surgical History:  Procedure Laterality Date  . APPENDECTOMY    . CARPAL TUNNEL RELEASE    . CATARACT EXTRACTION  03/2011   bilateral   . CHOLECYSTECTOMY  2001  . COLONOSCOPY W/ BIOPSIES  04/27/2008   diverticulosis, duodenitis  . FOOT SURGERY    . HERNIA REPAIR  1994   bilateral  . KNEE SURGERY     x 2  . LUNG SURGERY  2001  . PROSTATECTOMY  2002  . UPPER GASTROINTESTINAL ENDOSCOPY  04/27/2008   celiac disease, gastric polyps    There were no vitals filed for this visit.      Subjective Assessment - 07/16/16 0954    Subjective Pt states that he came for balance and he is still having trouble with balance.    Pertinent History "Pretty bad" preipheral neuropathy, and a "bad" left knee. Has had bilat knee scopes bilat about 8 years, and has been told he would need knee replacements.    Currently in Pain? Yes   Pain Score 4    Pain Location Knee   Pain Orientation Left   Pain Descriptors / Indicators Aching;Throbbing   Pain Onset Yesterday   Aggravating Factors  being on his feet    Pain Relieving Factors medication    Effect of Pain on Daily Activities increases             [facility] PT Assessment - 07/16/16 0001      Assessment   Medical Diagnosis Balance problems    Referring Provider Allyn Kenner    Onset Date/Surgical Date --  1 YA   Hand Dominance Right   Next MD Visit None scheduled, but seen q3M    Prior Therapy None, prior.      AROM   Cervical Flexion 50   Cervical Extension 50  was 49    Cervical - Right Rotation 62  43   Cervical - Left  Rotation 70  40     Strength   Right Hip Flexion 5/5  was 4/5   Right Hip Extension 3+/5  was 2+   Right Hip ABduction 4+/5  was 4/5    Left Hip Flexion 5/5  was 4-/5    Left Hip Extension 3/5  was 2+_   Left Hip ABduction 5/5  was 4/5    Right Knee Flexion 4-/5   Right Knee Extension 5/5  was 5/5   Left Knee Flexion 4+/5  was 3+/5    Left Knee Extension 5/5  was 4+/5    Right Ankle Dorsiflexion 3/5  decreased ROM was 2+/5    Left Ankle Dorsiflexion 3/5  decreased ROM was 2+/5      Transfers   Comments Pt does not want to complete sit to stand do to strain on his back      [employer]   Sit to Stand Able to stand without using hands and stabilize independently   Standing Unsupported Able to stand safely 2 minutes   Sitting with Back Unsupported but Feet Supported on Floor or Stool Able to sit safely and securely 2 minutes   Stand to Sit Sits safely with minimal use of hands   Transfers Able to transfer safely, minor use  of hands   Standing Unsupported with Eyes Closed Able to stand 10 seconds with supervision   Standing Ubsupported with Feet Together Able to place feet together independently and stand for 1 minute with supervision   From Standing, Reach Forward with Outstretched Arm Can reach forward >12 cm safely (5")   From Standing Position, Pick up Object from Floor Able to pick up shoe, needs supervision   From Standing Position, Turn to Look Behind Over each Shoulder Turn sideways only but maintains balance   Turn 360 Degrees Able to turn 360 degrees safely but slowly   Standing Unsupported, Alternately Place Feet on Step/Stool Able to complete >2 steps/needs minimal assist   Standing Unsupported, One Foot in Front Able to plae foot ahead of the other independently and hold 30 seconds   Standing on One Leg Unable to try or needs assist to prevent fall   Total Score 40                     [facility] Adult PT Treatment/Exercise - 07/16/16 0001      Ambulation/Gait   Gait Comments 2MWT 258 ft no AD limited by fatigue following  was 288; flat footed      Neck Exercises: Seated   Other Seated Exercise cervical retraction, shoulder retraction x 10              Balance Exercises - 07/16/16 1217      Balance Exercises: Standing   Tandem Stance Eyes open;3 reps;20 secs   SLS Eyes open;3 reps   Toe Raise Limitations x10 seated            PT Education - 07/16/16 1218    Education provided Yes   Education Details Pt explained the importance of good posture in balance and the importance of heel toe gait pattern    Person(s) Educated Patient   Methods Explanation;Handout   Comprehension Verbalized understanding;Returned demonstration          PT Short Term Goals - 07/16/16 1031      PT SHORT TERM GOAL #1   Title After 2 week pt wil demonstrate good compliance with HEP.    Baseline 9/6:  has difficulty with tandem stance.     Status Achieved     PT SHORT TERM GOAL #3   Title  After 4 weeks pt will improve cervical rotation/extension ROM by 25% to allow for greater vestibular contribution to balance.    Baseline 9/6: partially met   Status Partially Met     PT SHORT TERM GOAL #4   Title After 4 weeks pt will improve MMT in ankle dorsiflexion to 3/5 bilat to improve safety during AMB.    Status Achieved           PT Long Term Goals - 2016-07-27 1031      PT LONG TERM GOAL #1   Title After 4 weeks pt wil demonstrate good compliance with HEP.    Status Achieved     PT LONG TERM GOAL #2   Title After 4 weeks pt will score 47/56 on BBT to demonstrate reduced falls risk.    Status On-going     PT LONG TERM GOAL #3   Title After 4 weeks pt will improve cervical rotation/extension ROM by 50% compared to eval to allow for greater vestibular contribution to balance.    Status On-going     PT LONG TERM GOAL #4   Title After 8 weeks pt will demonstrate 4/5 or greater in all LE muscles as tested previously (with exception to ankle DF) to imporve functional strength for ADL.    Status Achieved               Plan - 07/27/16 1219    Clinical Impression Statement Pt states that his balance is not any better.  Pt reassessed with overall strength improved but gait and balance still have significant deficits.  Pt with peripheral neuropathy and ambulates flat footed pt given HEP to try and develop a heel toe gait pattern.  Pt request discharge to HEP at this time.    Rehab Potential Good   Clinical Impairments Affecting Rehab Potential Chronicity of postural impairment, bilat PND with decreased sensation in feet, pt is not fully convinced that PT will be able to help him, but is open to suggestion.    PT Frequency 1x / week   PT Duration 8 weeks   PT Treatment/Interventions Functional mobility training;Gait training;Therapeutic exercise;Therapeutic activities;Balance training;Neuromuscular re-education;Patient/family education;Manual techniques;Vestibular   PT Next  Visit Plan D/C    PT Home Exercise Plan current HEP: Semi Tandem Stance, narrow stance balance, 5x30sec each, sit to stand 3x7 (removed per MD due to back injury last week -BD 9/6); 9/27 (2-3' supine on bed for T-spine extension, + 1x10x3secH retraction into pillow, still needs pictures if these go well.    Consulted and Agree with Plan of Care Patient      Patient will benefit from skilled therapeutic intervention in order to improve the following deficits and impairments:  Abnormal gait, Decreased coordination, Decreased range of motion, Difficulty walking, Pain, Improper body mechanics, Postural dysfunction, Decreased strength, Decreased activity tolerance, Decreased balance  Visit Diagnosis: Unsteadiness on feet  History of falling  Other abnormalities of gait and mobility       G-Codes - 07-27-2016 1222    Functional Assessment Tool Used clinical judgement    Functional Limitation Changing and maintaining body position   Changing and Maintaining Body Position Goal Status (P2330) At least 60 percent but less than 80 percent impaired, limited or restricted   Changing and Maintaining Body Position Discharge Status (Q7622) At least 60 percent but less than 80  percent impaired, limited or restricted      Problem List Patient Active Problem List   Diagnosis Date Noted  . Odynophagia 04/10/2014  . Allergic rhinitis 03/23/2014  . Chronic rhinitis 12/22/2013  . 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 Constipation 08/22/2011  . OBESITY 09/20/2009  . G E R D 11/29/2007  . Celiac disease 11/29/2007   Rayetta Humphrey, PT CLT [phone] 07/16/2016, 12:24 PM  [facility] [address] [place], [place], [address] Phone: [phone]   Fax:  [phone]  Name: [name] MRN: [id number] Date of Birth: [birth year]   PHYSICAL THERAPY DISCHARGE SUMMARY  Visits from Start of Care: 8  Current functional level related to goals / functional  outcomes: See above    Remaining deficits:   Continues to have decreased balance and altered gt  Education / Equipment: HEP Plan: Patient agrees to discharge.  Patient goals were partially met. Patient is being discharged due to being pleased with the current functional level.  ?????        Rayetta Humphrey, [place] CLT [phone]

## 2016-07-25 ENCOUNTER — Ambulatory Visit (INDEPENDENT_AMBULATORY_CARE_PROVIDER_SITE_OTHER): Payer: PPO | Admitting: Cardiovascular Disease

## 2016-07-25 ENCOUNTER — Encounter: Payer: Self-pay | Admitting: Cardiovascular Disease

## 2016-07-25 VITALS — BP 142/62 | HR 97 | Ht 70.0 in | Wt 226.2 lb

## 2016-07-25 DIAGNOSIS — I739 Peripheral vascular disease, unspecified: Secondary | ICD-10-CM | POA: Diagnosis not present

## 2016-07-25 DIAGNOSIS — I480 Paroxysmal atrial fibrillation: Secondary | ICD-10-CM | POA: Diagnosis not present

## 2016-07-25 NOTE — Assessment & Plan Note (Signed)
[name] has bilateral lower extremity lifestyle limiting claudication with diminished pedal pulses. We will get lower extremity arterial Doppler studies to further evaluate this.

## 2016-07-25 NOTE — Patient Instructions (Addendum)
Medication Instructions:  NO CHANGES.   Testing/Procedures: Your physician has requested that you have a lower extremity arterial doppler- During this test, ultrasound is used to evaluate arterial blood flow in the legs. Allow approximately one hour for this exam.   Your physician has recommended that you wear an event monitor. Event monitors are medical devices that record the heart's electrical activity. Doctors most often [place] these monitors to diagnose arrhythmias. Arrhythmias are problems with the speed or rhythm of the heartbeat. The monitor is a small, portable device. You can wear one while you do your normal daily activities. This is usually used to diagnose what is causing palpitations/syncope (passing out). 2 WEEK EVENT.   Follow-Up: Your physician recommends that you schedule a follow-up appointment in: 4-5 [place].   If you need a refill on your cardiac medications before your next appointment, please call your pharmacy.

## 2016-07-25 NOTE — Progress Notes (Signed)
07/25/2016 [place]   [birth year]  [id number]  Primary Physician Wende Neighbors, MD Primary Cardiologist: Lorretta Harp MD Renae Gloss  HPI:  [name] is an 82 year old mild to moderately overweight married Caucasian male father of 62, grandfather and 6 grandchildren who is accompanied by his wife [name] today. He was referred by Dr. Wende Neighbors for peripheral vascular evaluation. He is a Engineering geologist. He lives in [place]. He has no cardiac risk factors. He does complain of lifestyle limiting bilateral lower extremity claudication. There also was a question of recently diagnosed atrial fibrillation. He has never had a heart attack or stroke. He denies chest pain but does get some some shortness of breath on exertion especially when his legs are bothering him.   Current Outpatient Prescriptions  Medication Sig Dispense Refill  . Alpha-Lipoic Acid 300 MG CAPS Take 1 capsule by mouth daily.      Marland Kitchen amitriptyline (ELAVIL) 75 MG tablet Take 75 mg by mouth daily.      . Ascorbic Acid (VITAMIN C) 1000 MG tablet Take 1,000 mg by mouth 2 (two) times daily.      Marland Kitchen aspirin 81 MG tablet Take 81 mg by mouth daily.      Marland Kitchen azelastine (ASTELIN) 0.1 % nasal spray     . calcium-vitamin D (OSCAL WITH D) 500-200 MG-UNIT per tablet Take 1 tablet by mouth 3 (three) times daily.      . Cholecalciferol (VITAMIN D3) 1000 UNITS CAPS Take 1 tablet by mouth 3 (three) times daily.     . Coenzyme Q10 (COQ10) 200 MG CAPS Take 1 capsule by mouth daily.      Marland Kitchen levocetirizine (XYZAL) 5 MG tablet     . levothyroxine (SYNTHROID, LEVOTHROID) 150 MCG tablet Take 150 mcg by mouth daily before breakfast.    . losartan (COZAAR) 100 MG tablet Take 1 tablet by mouth daily.    . magnesium gluconate (MAGONATE) 500 MG tablet Take 500 mg by mouth daily.      . Melatonin 3 MG TABS Take 1 tablet by mouth daily.      . metoprolol succinate (TOPROL-XL) 50 MG 24 hr tablet Take 1 tablet by mouth daily.     . Multiple Vitamin (MULTIVITAMIN) tablet Take 1 tablet by mouth daily.      . pantoprazole (PROTONIX) 40 MG tablet Take 40 mg by mouth daily.    . psyllium (REGULOID) 0.52 G capsule Take 6 capsules by mouth 2 (two) times daily.      Marland Kitchen senna (SENOKOT) 8.6 MG tablet Take 1 tablet by mouth daily as needed.      . triamcinolone (NASACORT) 55 MCG/ACT AERO nasal inhaler      No current facility-administered medications for this visit.     Allergies  Allergen Reactions  . Clindamycin Diarrhea  . Hydrocodone   . Penicillins   . Procaine Other (See Comments)    "Passes out"  . Procaine Hcl   . Sulfonamide Derivatives   . Cephalexin Nausea And Vomiting    Dry hives, "violent" diarhea  . Mold Extract [Trichophyton] Other (See Comments)    Stuffiness, post nasal drip    Social History   Social History  . Marital status: Married    Spouse name: N/A  . Number of children: N/A  . Years of education: N/A   Occupational History  . Retired     Theme park manager  .  [place]   Social History  Main Topics  . Smoking status: Never Smoker  . Smokeless tobacco: Never Used  . Alcohol use No  . Drug use: No  . Sexual activity: No   Other Topics Concern  . 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  Cardiovascular: negative for chest pain, dyspnea on exertion, edema, orthopnea, palpitations, paroxysmal nocturnal dyspnea or shortness of breath Dermatological: negative for rash Respiratory: negative for cough or wheezing Urologic: negative for hematuria Abdominal: negative for nausea, vomiting, diarrhea, bright red blood per rectum, melena, or hematemesis Neurologic: negative for visual changes, syncope, or dizziness All other systems reviewed and are otherwise negative except as noted above.    Blood pressure (!) 142/62, pulse 97,  height 5\' 10"  (1.778 m), weight 226 lb 3.2 oz (102.6 kg).  General appearance: alert and no distress Neck: no adenopathy, no carotid bruit, no JVD, supple, symmetrical, trachea midline and thyroid not enlarged, symmetric, no tenderness/mass/nodules Lungs: clear to auscultation bilaterally Heart: regular rate and rhythm, S1, S2 normal, no murmur, click, rub or gallop Extremities: 1+ bilateral lower extremity edema with diminished pedal pulses.  EKG not performed today.  ASSESSMENT AND PLAN:   Paroxysmal atrial fibrillation [facility]) Mr. Heft was recently told that he may have paroxysmal A. fib. I'm going to get a 2 week event monitor to further evaluate this.  Claudication [facility])  Mr. Bitting has bilateral lower extremity lifestyle limiting claudication with diminished pedal pulses. We will get lower extremity arterial Doppler studies to further evaluate this.      Lorretta Harp MD FACP,FACC,FAHA, [facility] 07/25/2016 12:09 PM

## 2016-07-25 NOTE — Assessment & Plan Note (Signed)
[name] was recently told that he may have paroxysmal A. fib. I'm going to get a 2 week event monitor to further evaluate this.

## 2016-07-28 ENCOUNTER — Other Ambulatory Visit: Payer: Self-pay | Admitting: Cardiovascular Disease

## 2016-07-28 DIAGNOSIS — I739 Peripheral vascular disease, unspecified: Secondary | ICD-10-CM

## 2016-07-31 DIAGNOSIS — M9902 Segmental and somatic dysfunction of thoracic region: Secondary | ICD-10-CM | POA: Diagnosis not present

## 2016-07-31 DIAGNOSIS — M546 Pain in thoracic spine: Secondary | ICD-10-CM | POA: Diagnosis not present

## 2016-08-01 DIAGNOSIS — M546 Pain in thoracic spine: Secondary | ICD-10-CM | POA: Diagnosis not present

## 2016-08-01 DIAGNOSIS — M9902 Segmental and somatic dysfunction of thoracic region: Secondary | ICD-10-CM | POA: Diagnosis not present

## 2016-08-02 DIAGNOSIS — G629 Polyneuropathy, unspecified: Secondary | ICD-10-CM | POA: Diagnosis not present

## 2016-08-02 DIAGNOSIS — D509 Iron deficiency anemia, unspecified: Secondary | ICD-10-CM | POA: Diagnosis not present

## 2016-08-02 DIAGNOSIS — Z23 Encounter for immunization: Secondary | ICD-10-CM | POA: Diagnosis not present

## 2016-08-04 DIAGNOSIS — M9902 Segmental and somatic dysfunction of thoracic region: Secondary | ICD-10-CM | POA: Diagnosis not present

## 2016-08-04 DIAGNOSIS — M546 Pain in thoracic spine: Secondary | ICD-10-CM | POA: Diagnosis not present

## 2016-08-06 ENCOUNTER — Ambulatory Visit (INDEPENDENT_AMBULATORY_CARE_PROVIDER_SITE_OTHER): Payer: PPO

## 2016-08-06 ENCOUNTER — Telehealth: Payer: Self-pay

## 2016-08-06 DIAGNOSIS — I739 Peripheral vascular disease, unspecified: Secondary | ICD-10-CM | POA: Diagnosis not present

## 2016-08-06 DIAGNOSIS — I480 Paroxysmal atrial fibrillation: Secondary | ICD-10-CM

## 2016-08-06 NOTE — Telephone Encounter (Signed)
Received a call from [place] with [place].She was calling to report patient just hooked up to monitor, baseline reading AFib with rate 60 to 86.She will fax EKG for Dr.Berry to review.

## 2016-08-08 NOTE — Telephone Encounter (Signed)
Received faxed report of baseline transmission. Dr Gwenlyn Found reviewed report and advised from patient to be seen next week with a mid-level provider to address prior to his office visit with him.

## 2016-08-08 NOTE — Telephone Encounter (Signed)
Reached patient on his cell phone. He works on Monday and Tuesday but can come in on 11/1. Appt made with Almyra Deforest on 08/13/16. Patient verbalized understanding.

## 2016-08-09 DIAGNOSIS — I480 Paroxysmal atrial fibrillation: Secondary | ICD-10-CM | POA: Diagnosis not present

## 2016-08-13 ENCOUNTER — Encounter: Payer: Self-pay | Admitting: Physician Assistant

## 2016-08-13 ENCOUNTER — Other Ambulatory Visit: Payer: Self-pay | Admitting: Physician Assistant

## 2016-08-13 ENCOUNTER — Ambulatory Visit (INDEPENDENT_AMBULATORY_CARE_PROVIDER_SITE_OTHER): Payer: PPO | Admitting: Physician Assistant

## 2016-08-13 VITALS — BP 138/52 | HR 81 | Ht 70.0 in | Wt 225.4 lb

## 2016-08-13 DIAGNOSIS — E039 Hypothyroidism, unspecified: Secondary | ICD-10-CM | POA: Diagnosis not present

## 2016-08-13 DIAGNOSIS — E118 Type 2 diabetes mellitus with unspecified complications: Secondary | ICD-10-CM | POA: Diagnosis not present

## 2016-08-13 DIAGNOSIS — G4733 Obstructive sleep apnea (adult) (pediatric): Secondary | ICD-10-CM

## 2016-08-13 DIAGNOSIS — I481 Persistent atrial fibrillation: Secondary | ICD-10-CM

## 2016-08-13 DIAGNOSIS — I739 Peripheral vascular disease, unspecified: Secondary | ICD-10-CM

## 2016-08-13 DIAGNOSIS — I4819 Other persistent atrial fibrillation: Secondary | ICD-10-CM

## 2016-08-13 LAB — CBC WITH DIFFERENTIAL/PLATELET
BASOS PCT: 0 %
Basophils Absolute: 0 cells/uL (ref 0–200)
EOS ABS: 420 {cells}/uL (ref 15–500)
Eosinophils Relative: 4 %
HEMATOCRIT: 38.7 % (ref 38.5–50.0)
Hemoglobin: 12.6 g/dL — ABNORMAL LOW (ref 13.2–17.1)
Lymphocytes Relative: 17 %
Lymphs Abs: 1785 cells/uL (ref 850–3900)
MCH: 31.9 pg (ref 27.0–33.0)
MCHC: 32.6 g/dL (ref 32.0–36.0)
MCV: 98 fL (ref 80.0–100.0)
MONO ABS: 1260 {cells}/uL — AB (ref 200–950)
MPV: 10 fL (ref 7.5–12.5)
Monocytes Relative: 12 %
NEUTROS ABS: 7035 {cells}/uL (ref 1500–7800)
Neutrophils Relative %: 67 %
PLATELETS: 328 10*3/uL (ref 140–400)
RBC: 3.95 MIL/uL — AB (ref 4.20–5.80)
RDW: 13.4 % (ref 11.0–15.0)
WBC: 10.5 10*3/uL (ref 3.8–10.8)

## 2016-08-13 LAB — BASIC METABOLIC PANEL
BUN: 19 mg/dL (ref 7–25)
CHLORIDE: 107 mmol/L (ref 98–110)
CO2: 24 mmol/L (ref 20–31)
CREATININE: 1.33 mg/dL — AB (ref 0.70–1.11)
Calcium: 9.5 mg/dL (ref 8.6–10.3)
Glucose, Bld: 101 mg/dL — ABNORMAL HIGH (ref 65–99)
POTASSIUM: 5.5 mmol/L — AB (ref 3.5–5.3)
Sodium: 139 mmol/L (ref 135–146)

## 2016-08-13 LAB — TSH: TSH: 2.48 mIU/L (ref 0.40–4.50)

## 2016-08-13 MED ORDER — METOPROLOL SUCCINATE ER 50 MG PO TB24: ORAL_TABLET | ORAL | 5 refills | Status: DC

## 2016-08-13 NOTE — Patient Instructions (Addendum)
Medication Instructions:  INCREASE YOUR METOPROLOL 50 MG 1 AND 1/2 TABLETS DAILY   Labwork: TSH/BMET/CBC AT [facility] ON THE FIRST FLOOR  Testing/Procedures: Your physician has requested that you have an echocardiogram. Echocardiography is a painless test that uses sound waves to create images of your heart. It provides your doctor with information about the size and shape of your heart and how well your heart's chambers and valves are working. This procedure takes approximately one hour. There are no restrictions for this procedure.  Follow-Up: Your physician recommends that you schedule a follow-up appointment in: [place]  If you need a refill on your cardiac medications before your next appointment, please call your pharmacy.

## 2016-08-13 NOTE — Progress Notes (Signed)
Cardiology Office Note    Date:  08/13/2016   ID:  [name], DOB 09/18/1929, MRN [id number]  PCP:  Wende Neighbors, MD  Cardiologist:  Dr. Gwenlyn Found  Chief Complaint  Patient presents with  . Follow-up    seen for Dr. Gwenlyn Found, event monitor positive for persistent afib    History of Present Illness:  [name] is a 82 y.o. male with PMH of PAF, DM, GERD, hypothyroidism, RLS and OSA. He has been followed by Dr. Gwenlyn Found for peripheral vascular evaluation. 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 He has otherwise no cardiac risk factor. He was recently told that he has paroxysmal atrial fibrillation over a month ago during rehabilitation for her physical therapist told him that his heart rate is very irregular. On the last office visit on 07/25/2016, a 2 week event monitor was ordered which came back positive for atrial fibrillation. Patient also have bilateral lower extremity claudication symptoms with diminished pedal pulses, a lower extremity arterial Doppler study was ordered. According to the patient, he has no cardiac awareness. He has no history of stroke. However he falls clear frequently roughly 7-8 times a year due to balance issues which is one of the reasons why he was in physical therapy. Despite having no cardiac awareness, his heart rate is fairly well controlled on current medication. He denies any significant chest discomfort or shortness of breath. He has no lower extremity edema, orthopnea or PND episodes.   Past Medical History:  Diagnosis Date  . Allergic rhinitis   . Asthmatic bronchitis   . Atrial fibrillation ([place])   . Celiac disease   . Cellulitis   . Diverticulosis   . DM (diabetes mellitus) ([place])   . Fx. left wrist 1987  . Gastric polyps   . GERD (gastroesophageal reflux disease)   . History of pneumonia    w/pleural effusion  . Hypothyroidism   . Iron deficiency anemia   . Melanoma ([place])   . Neuropathy ([place])   . Prostate cancer  ([place])   . REM sleep behavior disorder   . RLS (restless legs syndrome)   . Sleep apnea     Past Surgical History:  Procedure Laterality Date  . APPENDECTOMY    . CARPAL TUNNEL RELEASE    . CATARACT EXTRACTION  03/2011   bilateral   . CHOLECYSTECTOMY  2001  . COLONOSCOPY W/ BIOPSIES  04/27/2008   diverticulosis, duodenitis  . FOOT SURGERY    . HERNIA REPAIR  1994   bilateral  . KNEE SURGERY     x 2  . LUNG SURGERY  2001  . PROSTATECTOMY  2002  . UPPER GASTROINTESTINAL ENDOSCOPY  04/27/2008   celiac disease, gastric polyps    Current Medications: Outpatient Medications Prior to Visit  Medication Sig Dispense Refill  . Alpha-Lipoic Acid 300 MG CAPS Take 1 capsule by mouth daily.      Marland Kitchen amitriptyline (ELAVIL) 75 MG tablet Take 75 mg by mouth daily.      . Ascorbic Acid (VITAMIN C) 1000 MG tablet Take 1,000 mg by mouth 2 (two) times daily.      Marland Kitchen aspirin 81 MG tablet Take 81 mg by mouth daily.      Marland Kitchen azelastine (ASTELIN) 0.1 % nasal spray     . calcium-vitamin D (OSCAL WITH D) 500-200 MG-UNIT per tablet Take 1 tablet by mouth 3 (three) times daily.      . Cholecalciferol (VITAMIN D3) 1000 UNITS CAPS  Take 1 tablet by mouth 3 (three) times daily.     . Coenzyme Q10 (COQ10) 200 MG CAPS Take 1 capsule by mouth daily.      Marland Kitchen levocetirizine (XYZAL) 5 MG tablet     . levothyroxine (SYNTHROID, LEVOTHROID) 150 MCG tablet Take 150 mcg by mouth daily before breakfast.    . losartan (COZAAR) 100 MG tablet Take 1 tablet by mouth daily.    . magnesium gluconate (MAGONATE) 500 MG tablet Take 500 mg by mouth daily.      . Melatonin 3 MG TABS Take 1 tablet by mouth daily.      . Multiple Vitamin (MULTIVITAMIN) tablet Take 1 tablet by mouth daily.      . pantoprazole (PROTONIX) 40 MG tablet Take 40 mg by mouth daily.    Marland Kitchen senna (SENOKOT) 8.6 MG tablet Take 1 tablet by mouth daily as needed.      . triamcinolone (NASACORT) 55 MCG/ACT AERO nasal inhaler     . metoprolol succinate (TOPROL-XL) 50 MG  24 hr tablet Take 1 tablet by mouth daily.    . psyllium (REGULOID) 0.52 G capsule Take 6 capsules by mouth 2 (two) times daily.       No facility-administered medications prior to visit.      Allergies:   Clindamycin; Hydrocodone; Penicillins; Procaine; Procaine hcl; Sulfonamide derivatives; Cephalexin; and Mold extract [trichophyton]   Social History   Social History  . Marital status: Married    Spouse name: N/A  . Number of children: N/A  . Years of education: N/A   Occupational History  . Retired     Theme park manager  .  [place] History Main Topics  . Smoking status: Never Smoker  . Smokeless tobacco: Never Used  . Alcohol use No  . Drug use: No  . Sexual activity: No   Other Topics Concern  . 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   ROS:   Please see the history of present illness.    ROS All other systems reviewed and are negative.   PHYSICAL EXAM:   VS:  BP (!) 138/52   Pulse 81   Ht 5\' 10"  (1.778 m)   Wt 225 lb 6.4 oz (102.2 kg)   BMI 32.34 kg/m    GEN: Well nourished, well developed, in no acute distress  HEENT: normal  Neck: no JVD, carotid bruits, or masses Cardiac: no murmurs, rubs, or gallops,no edema +Irregularly irregular Respiratory:  clear to auscultation bilaterally, normal work of breathing GI: soft, nontender, nondistended, + BS MS: no deformity or atrophy  Skin: warm and dry, no rash Neuro:  Alert and Oriented x 3, Strength and sensation are intact Psych: euthymic mood, full affect  Wt Readings from Last 3 Encounters:  08/13/16 225 lb 6.4 oz (102.2 kg)  07/25/16 226 lb 3.2 oz (102.6 kg)  04/10/14 216 lb 3.2 oz (98.1 kg)      Studies/Labs Reviewed:   EKG:  EKG is ordered today.  The ekg  ordered today demonstrates Atrial fibrillation, right bundle branch block, heart rate 81.  Recent Labs: No results found for requested labs within last 8760 hours.   Lipid Panel    Component Value Date/Time   CHOL  02/28/2009 1631    154        ATP III CLASSIFICATION:  <200     mg/dL   Desirable  200-239  mg/dL   Borderline High  >=240    mg/dL   High          TRIG 296 (H) 02/28/2009 1631   HDL 23 (L) 02/28/2009 1631   CHOLHDL 6.7 02/28/2009 1631   VLDL 59 (H) 02/28/2009 1631   LDLCALC  02/28/2009 1631    72        Total Cholesterol/HDL:CHD Risk Coronary Heart Disease Risk Table                     Men   Women  1/2 Average Risk   3.4   3.3  Average Risk       5.0   4.4  2 X Average Risk   9.6   7.1  3 X Average Risk  23.4   11.0        Use the calculated Patient Ratio above and the CHD Risk Table to determine the patient's CHD Risk.        ATP III CLASSIFICATION (LDL):  <100     mg/dL   Optimal  100-129  mg/dL   Near or Above                    Optimal  130-159  mg/dL   Borderline  160-189  mg/dL   High  >190     mg/dL   Very High    Additional studies/ records that were reviewed today include:   Recent 2 week event monitor result, persistent atrial fibrillation, heart rate controlled.    ASSESSMENT:    1. Persistent atrial fibrillation ([place])   2. Controlled type 2 diabetes mellitus with complication, without long-term current use of insulin ([place])   3. Hypothyroidism, unspecified type   4. OSA (obstructive sleep apnea)   5. Claudication [facility])      PLAN:  In order of problems listed above:  1. Persistent atrial fibrillation on ASA  - This patients CHA2DS2-VASc Score and unadjusted Ischemic Stroke Rate (% per year) is equal to 3.2 % stroke rate/year from a score of 3  Above score calculated as 1 point each if present [CHF, HTN, DM, Vascular=MI/PAD/Aortic Plaque, Age if 65-74, or Male] Above score calculated as 2 points each if present [Age > 75, or  Stroke/TIA/TE]  - We had an extensive discussion and a comparison between traditional Coumadin versus NOAC including eliquis and Xarelto.  - Patient is strongly against both Xarelto and Coumadin based on the experience of his church members. I have discussed with the patient that all systemic anticoagulation carries the risk of bleeding, and in this case we have to evaluate the risk of bleeding versus the risk of stroke.  - E also has history of GI bleed with hemoglobin 8.4 in June 2013. He attributed to the NSAID use. He has not had any bleeding since, however does fall frequently up to 7-8 times a year. I am hesitant to start on eliquis today. I will continue him on aspirin. He understand, aspirin alone is associated with higher risk of stroke.  - Will plan for better rate control, increase Toprol-XL to 75 mg daily. We'll also obtain echocardiogram to take a look at left atrial size.  - Basic labs include CBC and basic metabolic panel.  2. Claudication: Plan to obtain outpatient lower extremity arterial  Doppler.  3. Hypothyroidism: Check TSH given atrial fibrillation.    Medication Adjustments/Labs and Tests Ordered: Current medicines are reviewed at length with the patient today.  Concerns regarding medicines are outlined above.  Medication changes, Labs and Tests ordered today are listed in the Patient Instructions below. Patient Instructions  Medication Instructions:  INCREASE YOUR METOPROLOL 50 MG 1 AND 1/2 TABLETS DAILY   Labwork: TSH/BMET/CBC AT [facility] ON THE FIRST FLOOR  Testing/Procedures: Your physician has requested that you have an echocardiogram. Echocardiography is a painless test that uses sound waves to create images of your heart. It provides your doctor with information about the size and shape of your heart and how well your heart's chambers and valves are working. This procedure takes approximately one hour. There are no restrictions for this  procedure.  Follow-Up: Your physician recommends that you schedule a follow-up appointment in: [place]  If you need a refill on your cardiac medications before your next appointment, please call your pharmacy.     Hilbert Corrigan, [name]  08/13/2016 3:49 PM    [facility] [place], [place], [place]  [address] Phone: [phone]; Fax: [phone]

## 2016-08-15 ENCOUNTER — Telehealth: Payer: Self-pay | Admitting: Physician Assistant

## 2016-08-15 NOTE — Telephone Encounter (Signed)
Results given to pt wife--OK per DPR. Verbalized understanding. Repeat BMET ordered. Pt wife rescheduled appointment due to appointment with another dr scheduled to close to previous time. Pt now scheduled for appt with Dr. Gwenlyn Found on 09/17/16 at 9:15 am.

## 2016-08-15 NOTE — Telephone Encounter (Signed)
-----   Message from [place], [name] sent at 08/15/2016  7:56 AM EDT ----- Thyroid normal. Red blood cell count significantly improved compare to 4 years ago, now is close to normal. Potassium level high, recommend repeat BMET lab 2 weeks before next visit

## 2016-08-21 ENCOUNTER — Ambulatory Visit (HOSPITAL_COMMUNITY)
Admission: RE | Admit: 2016-08-21 | Discharge: 2016-08-21 | Disposition: A | Discharge status: Home / Self Care | Payer: PPO | Source: Ambulatory Visit | Attending: Cardiology | Admitting: Cardiology

## 2016-08-21 DIAGNOSIS — E119 Type 2 diabetes mellitus without complications: Secondary | ICD-10-CM | POA: Diagnosis not present

## 2016-08-21 DIAGNOSIS — I739 Peripheral vascular disease, unspecified: Secondary | ICD-10-CM

## 2016-08-27 ENCOUNTER — Ambulatory Visit: Payer: PPO | Admitting: Cardiovascular Disease

## 2016-09-02 ENCOUNTER — Other Ambulatory Visit: Payer: PPO | Admitting: *Deleted

## 2016-09-02 ENCOUNTER — Encounter (INDEPENDENT_AMBULATORY_CARE_PROVIDER_SITE_OTHER): Payer: Self-pay

## 2016-09-02 ENCOUNTER — Ambulatory Visit (HOSPITAL_COMMUNITY): Payer: PPO | Attending: Cardiovascular Disease

## 2016-09-02 DIAGNOSIS — I071 Rheumatic tricuspid insufficiency: Secondary | ICD-10-CM | POA: Diagnosis not present

## 2016-09-02 DIAGNOSIS — I739 Peripheral vascular disease, unspecified: Secondary | ICD-10-CM

## 2016-09-02 DIAGNOSIS — I480 Paroxysmal atrial fibrillation: Secondary | ICD-10-CM | POA: Diagnosis not present

## 2016-09-02 DIAGNOSIS — I4819 Other persistent atrial fibrillation: Secondary | ICD-10-CM

## 2016-09-02 DIAGNOSIS — E669 Obesity, unspecified: Secondary | ICD-10-CM | POA: Diagnosis not present

## 2016-09-02 DIAGNOSIS — I481 Persistent atrial fibrillation: Secondary | ICD-10-CM | POA: Diagnosis not present

## 2016-09-02 DIAGNOSIS — I351 Nonrheumatic aortic (valve) insufficiency: Secondary | ICD-10-CM | POA: Diagnosis not present

## 2016-09-02 LAB — CBC WITH DIFFERENTIAL/PLATELET
BASOS PCT: 0 %
Basophils Absolute: 0 cells/uL (ref 0–200)
EOS PCT: 4 %
Eosinophils Absolute: 332 cells/uL (ref 15–500)
HEMATOCRIT: 36.8 % — AB (ref 38.5–50.0)
HEMOGLOBIN: 11.9 g/dL — AB (ref 13.2–17.1)
LYMPHS ABS: 1411 {cells}/uL (ref 850–3900)
Lymphocytes Relative: 17 %
MCH: 31.4 pg (ref 27.0–33.0)
MCHC: 32.3 g/dL (ref 32.0–36.0)
MCV: 97.1 fL (ref 80.0–100.0)
MONO ABS: 996 {cells}/uL — AB (ref 200–950)
MPV: 9.4 fL (ref 7.5–12.5)
Monocytes Relative: 12 %
NEUTROS ABS: 5561 {cells}/uL (ref 1500–7800)
NEUTROS PCT: 67 %
Platelets: 326 10*3/uL (ref 140–400)
RBC: 3.79 MIL/uL — AB (ref 4.20–5.80)
RDW: 13.4 % (ref 11.0–15.0)
WBC: 8.3 10*3/uL (ref 3.8–10.8)

## 2016-09-02 LAB — BASIC METABOLIC PANEL
BUN: 20 mg/dL (ref 7–25)
CALCIUM: 9 mg/dL (ref 8.6–10.3)
CO2: 24 mmol/L (ref 20–31)
Chloride: 106 mmol/L (ref 98–110)
Creat: 1.3 mg/dL — ABNORMAL HIGH (ref 0.70–1.11)
GLUCOSE: 107 mg/dL — AB (ref 65–99)
POTASSIUM: 5 mmol/L (ref 3.5–5.3)
SODIUM: 138 mmol/L (ref 135–146)

## 2016-09-02 LAB — TSH: TSH: 2.85 m[IU]/L (ref 0.40–4.50)

## 2016-09-12 DIAGNOSIS — Z6833 Body mass index (BMI) 33.0-33.9, adult: Secondary | ICD-10-CM | POA: Diagnosis not present

## 2016-09-12 DIAGNOSIS — S8011XA Contusion of right lower leg, initial encounter: Secondary | ICD-10-CM | POA: Diagnosis not present

## 2016-09-17 ENCOUNTER — Ambulatory Visit (INDEPENDENT_AMBULATORY_CARE_PROVIDER_SITE_OTHER): Payer: PPO | Admitting: Podiatry

## 2016-09-17 ENCOUNTER — Ambulatory Visit: Payer: PPO | Admitting: Cardiovascular Disease

## 2016-09-17 ENCOUNTER — Encounter: Payer: Self-pay | Admitting: Podiatry

## 2016-09-17 ENCOUNTER — Encounter: Payer: Self-pay | Admitting: Cardiovascular Disease

## 2016-09-17 ENCOUNTER — Ambulatory Visit (INDEPENDENT_AMBULATORY_CARE_PROVIDER_SITE_OTHER): Payer: PPO | Admitting: Cardiovascular Disease

## 2016-09-17 VITALS — BP 139/58 | HR 103 | Resp 18

## 2016-09-17 DIAGNOSIS — I480 Paroxysmal atrial fibrillation: Secondary | ICD-10-CM | POA: Diagnosis not present

## 2016-09-17 DIAGNOSIS — I739 Peripheral vascular disease, unspecified: Secondary | ICD-10-CM

## 2016-09-17 DIAGNOSIS — B351 Tinea unguium: Secondary | ICD-10-CM | POA: Diagnosis not present

## 2016-09-17 DIAGNOSIS — M79676 Pain in unspecified toe(s): Secondary | ICD-10-CM

## 2016-09-17 NOTE — Assessment & Plan Note (Signed)
History of PAF by event monitoring. He is asymptomatic from this. He did see how making PAC who discussed anticoagulation and his stroke risk given his CHA2DVASC2 of 3. He has had a GI bleed in the past but more importantly he has a history of mechanical falls occurring 6-7 times a year. He is seeing a physical therapist for this. I think his fall risk on oral anticoagulation outweighs his benefit from stroke prophylaxis and therefore I've recommended not to initiate oral anticoagulation at this time.

## 2016-09-17 NOTE — Assessment & Plan Note (Signed)
[name] returns here for follow-up of his Doppler studies ordered to evaluate symptoms of claudication. These were entirely normal. I do not think he's got a large vessel vascular obstructive cause to his symptoms.

## 2016-09-17 NOTE — Progress Notes (Signed)
Patient ID: [name], male   DOB: 1929-07-01, 82 y.o.   MRN: [id number]    Subjective: This patient presents today for scheduled visit complaining of painful toenails and walking wearing shoes and requests toenail debridement  Objective: Orientated 3 DP and PT pulses 2/4 bilaterally Capillary reflex immediate bilaterally Sensation to 10 g monofilament wire intact 0/5 bilaterally Vibratory sensation reactive bilaterally Ankle reflex equal and reactive bilaterally Manual motor testing dorsi flexion, plantar flexion 5/5 bilaterally No open skin lesions bilaterally The toenails are elongated, hypertrophic, brittle, discolored and tender to direct palpation 6-10  Assessment: Symptomatic onychomycoses 6-10 Peripheral neuropathy  Plan: Debrided toenails 10 mechanically and electrically without any bleeding  Reappoint 3 month

## 2016-09-17 NOTE — Progress Notes (Signed)
[name] returns here for follow-up of his Doppler studies performed because of symptoms of claudication. These were entirely normal. He did have an event monitor in addition that showed atrial fibrillation with controlled ventricular response. I thought he was high risk for anticoagulation given his fall risk. A 2-D echo revealed normal LV systolic function with mild aortic stenosis. I will see him back on an annual basis.  Lorretta Harp, M.D., [place], [facility], [place] [address] [place], [place]  [address]  [phone] 09/17/2016 9:50 AM

## 2016-09-17 NOTE — Patient Instructions (Signed)

## 2016-09-24 DIAGNOSIS — L570 Actinic keratosis: Secondary | ICD-10-CM | POA: Diagnosis not present

## 2016-09-24 DIAGNOSIS — D229 Melanocytic nevi, unspecified: Secondary | ICD-10-CM | POA: Diagnosis not present

## 2016-09-24 DIAGNOSIS — L309 Dermatitis, unspecified: Secondary | ICD-10-CM | POA: Diagnosis not present

## 2016-09-24 DIAGNOSIS — Z85820 Personal history of malignant melanoma of skin: Secondary | ICD-10-CM | POA: Diagnosis not present

## 2016-10-20 DIAGNOSIS — E1142 Type 2 diabetes mellitus with diabetic polyneuropathy: Secondary | ICD-10-CM | POA: Diagnosis not present

## 2016-10-20 DIAGNOSIS — E782 Mixed hyperlipidemia: Secondary | ICD-10-CM | POA: Diagnosis not present

## 2016-10-20 DIAGNOSIS — I10 Essential (primary) hypertension: Secondary | ICD-10-CM | POA: Diagnosis not present

## 2016-10-22 DIAGNOSIS — M25569 Pain in unspecified knee: Secondary | ICD-10-CM | POA: Diagnosis not present

## 2016-10-22 DIAGNOSIS — R2689 Other abnormalities of gait and mobility: Secondary | ICD-10-CM | POA: Diagnosis not present

## 2016-10-22 DIAGNOSIS — E782 Mixed hyperlipidemia: Secondary | ICD-10-CM | POA: Diagnosis not present

## 2016-10-22 DIAGNOSIS — D509 Iron deficiency anemia, unspecified: Secondary | ICD-10-CM | POA: Diagnosis not present

## 2016-10-22 DIAGNOSIS — E1142 Type 2 diabetes mellitus with diabetic polyneuropathy: Secondary | ICD-10-CM | POA: Diagnosis not present

## 2016-10-22 DIAGNOSIS — R944 Abnormal results of kidney function studies: Secondary | ICD-10-CM | POA: Diagnosis not present

## 2016-10-22 DIAGNOSIS — I10 Essential (primary) hypertension: Secondary | ICD-10-CM | POA: Diagnosis not present

## 2016-10-22 DIAGNOSIS — E875 Hyperkalemia: Secondary | ICD-10-CM | POA: Diagnosis not present

## 2016-10-22 DIAGNOSIS — K900 Celiac disease: Secondary | ICD-10-CM | POA: Diagnosis not present

## 2016-10-22 DIAGNOSIS — I499 Cardiac arrhythmia, unspecified: Secondary | ICD-10-CM | POA: Diagnosis not present

## 2016-11-12 DIAGNOSIS — J3089 Other allergic rhinitis: Secondary | ICD-10-CM | POA: Diagnosis not present

## 2016-12-17 ENCOUNTER — Encounter: Payer: Self-pay | Admitting: Podiatry

## 2016-12-17 ENCOUNTER — Ambulatory Visit (INDEPENDENT_AMBULATORY_CARE_PROVIDER_SITE_OTHER): Payer: PPO | Admitting: Podiatry

## 2016-12-17 DIAGNOSIS — M79676 Pain in unspecified toe(s): Secondary | ICD-10-CM | POA: Diagnosis not present

## 2016-12-17 DIAGNOSIS — M545 Low back pain: Secondary | ICD-10-CM | POA: Diagnosis not present

## 2016-12-17 DIAGNOSIS — M9903 Segmental and somatic dysfunction of lumbar region: Secondary | ICD-10-CM | POA: Diagnosis not present

## 2016-12-17 DIAGNOSIS — B351 Tinea unguium: Secondary | ICD-10-CM | POA: Diagnosis not present

## 2016-12-17 NOTE — Progress Notes (Signed)
Patient ID: [name], male   DOB: 1929-03-06, 83 y.o.   MRN: [id number]    Subjective: This patient presents today for scheduled visit complaining of painful toenails and walking wearing shoes and requests toenail debridement  Objective: Orientated 3 DP and PT pulses 2/4 bilaterally Capillary reflex immediate bilaterally Sensation to 10 g monofilament wire intact 0/5 bilaterally Vibratory sensation reactive bilaterally Ankle reflex equal and reactive bilaterally Manual motor testing dorsi flexion, plantar flexion 5/5 bilaterally No open skin lesions bilaterally Rubor bilaterally Atrophic skin bilaterally The toenails are elongated, hypertrophic, brittle, discolored and tender to direct palpation 6-10  Assessment: Symptomatic onychomycoses 6-10 Peripheral neuropathy  Plan: Debrided toenails 10 mechanically and electrically without any bleeding  Reappoint 3 month

## 2016-12-19 DIAGNOSIS — M9903 Segmental and somatic dysfunction of lumbar region: Secondary | ICD-10-CM | POA: Diagnosis not present

## 2016-12-19 DIAGNOSIS — M545 Low back pain: Secondary | ICD-10-CM | POA: Diagnosis not present

## 2016-12-23 DIAGNOSIS — M9903 Segmental and somatic dysfunction of lumbar region: Secondary | ICD-10-CM | POA: Diagnosis not present

## 2016-12-23 DIAGNOSIS — M545 Low back pain: Secondary | ICD-10-CM | POA: Diagnosis not present

## 2016-12-25 DIAGNOSIS — M9903 Segmental and somatic dysfunction of lumbar region: Secondary | ICD-10-CM | POA: Diagnosis not present

## 2016-12-25 DIAGNOSIS — M545 Low back pain: Secondary | ICD-10-CM | POA: Diagnosis not present

## 2016-12-29 DIAGNOSIS — M9903 Segmental and somatic dysfunction of lumbar region: Secondary | ICD-10-CM | POA: Diagnosis not present

## 2016-12-29 DIAGNOSIS — M545 Low back pain: Secondary | ICD-10-CM | POA: Diagnosis not present

## 2016-12-30 DIAGNOSIS — M545 Low back pain: Secondary | ICD-10-CM | POA: Diagnosis not present

## 2016-12-30 DIAGNOSIS — Z6832 Body mass index (BMI) 32.0-32.9, adult: Secondary | ICD-10-CM | POA: Diagnosis not present

## 2017-01-08 ENCOUNTER — Telehealth: Payer: Self-pay | Admitting: Cardiovascular Disease

## 2017-01-08 NOTE — Telephone Encounter (Signed)
New Message    Per patient Dr. Gwenlyn Found diagnosed him with Afib, and he is wanting to see Dr. Rayann Heman for a second opinion. Please advise

## 2017-01-08 NOTE — Telephone Encounter (Signed)
New Message   Pt c/o Shortness Of Breath: STAT if SOB developed within the last 24 hours or pt is noticeably SOB on the phone  1. Are you currently SOB (can you hear that pt is SOB on the phone)? Yes  2. How long have you been experiencing SOB? Since Tuesday  3. Are you SOB when sitting or when up moving around? Moving around  4. Are you currently experiencing any other symptoms? No. Some coughing

## 2017-01-09 ENCOUNTER — Inpatient Hospital Stay (HOSPITAL_COMMUNITY)
Admission: EM | Admit: 2017-01-09 | Discharge: 2017-01-12 | DRG: 308 | Disposition: A | Discharge status: Home / Self Care | Payer: PPO | Attending: Internal Medicine | Admitting: Internal Medicine

## 2017-01-09 ENCOUNTER — Emergency Department (HOSPITAL_COMMUNITY): Payer: PPO

## 2017-01-09 ENCOUNTER — Encounter (HOSPITAL_COMMUNITY): Payer: Self-pay

## 2017-01-09 DIAGNOSIS — I4891 Unspecified atrial fibrillation: Secondary | ICD-10-CM | POA: Diagnosis not present

## 2017-01-09 DIAGNOSIS — E669 Obesity, unspecified: Secondary | ICD-10-CM | POA: Diagnosis present

## 2017-01-09 DIAGNOSIS — Z79899 Other long term (current) drug therapy: Secondary | ICD-10-CM | POA: Diagnosis not present

## 2017-01-09 DIAGNOSIS — G4730 Sleep apnea, unspecified: Secondary | ICD-10-CM | POA: Diagnosis not present

## 2017-01-09 DIAGNOSIS — Z9079 Acquired absence of other genital organ(s): Secondary | ICD-10-CM | POA: Diagnosis not present

## 2017-01-09 DIAGNOSIS — R0602 Shortness of breath: Secondary | ICD-10-CM | POA: Diagnosis not present

## 2017-01-09 DIAGNOSIS — I5033 Acute on chronic diastolic (congestive) heart failure: Secondary | ICD-10-CM | POA: Diagnosis present

## 2017-01-09 DIAGNOSIS — G2581 Restless legs syndrome: Secondary | ICD-10-CM | POA: Diagnosis present

## 2017-01-09 DIAGNOSIS — I110 Hypertensive heart disease with heart failure: Secondary | ICD-10-CM | POA: Diagnosis present

## 2017-01-09 DIAGNOSIS — K219 Gastro-esophageal reflux disease without esophagitis: Secondary | ICD-10-CM | POA: Diagnosis not present

## 2017-01-09 DIAGNOSIS — E039 Hypothyroidism, unspecified: Secondary | ICD-10-CM | POA: Diagnosis not present

## 2017-01-09 DIAGNOSIS — K900 Celiac disease: Secondary | ICD-10-CM | POA: Diagnosis not present

## 2017-01-09 DIAGNOSIS — Z8546 Personal history of malignant neoplasm of prostate: Secondary | ICD-10-CM | POA: Diagnosis not present

## 2017-01-09 DIAGNOSIS — Z9049 Acquired absence of other specified parts of digestive tract: Secondary | ICD-10-CM

## 2017-01-09 DIAGNOSIS — Z880 Allergy status to penicillin: Secondary | ICD-10-CM

## 2017-01-09 DIAGNOSIS — Z85820 Personal history of malignant melanoma of skin: Secondary | ICD-10-CM

## 2017-01-09 DIAGNOSIS — G4752 REM sleep behavior disorder: Secondary | ICD-10-CM | POA: Diagnosis not present

## 2017-01-09 DIAGNOSIS — E1151 Type 2 diabetes mellitus with diabetic peripheral angiopathy without gangrene: Secondary | ICD-10-CM | POA: Diagnosis not present

## 2017-01-09 DIAGNOSIS — Z6832 Body mass index (BMI) 32.0-32.9, adult: Secondary | ICD-10-CM

## 2017-01-09 DIAGNOSIS — E1140 Type 2 diabetes mellitus with diabetic neuropathy, unspecified: Secondary | ICD-10-CM | POA: Diagnosis not present

## 2017-01-09 DIAGNOSIS — Z888 Allergy status to other drugs, medicaments and biological substances status: Secondary | ICD-10-CM

## 2017-01-09 DIAGNOSIS — I509 Heart failure, unspecified: Secondary | ICD-10-CM

## 2017-01-09 DIAGNOSIS — I482 Chronic atrial fibrillation: Secondary | ICD-10-CM | POA: Diagnosis not present

## 2017-01-09 DIAGNOSIS — I480 Paroxysmal atrial fibrillation: Principal | ICD-10-CM | POA: Diagnosis present

## 2017-01-09 DIAGNOSIS — J45909 Unspecified asthma, uncomplicated: Secondary | ICD-10-CM | POA: Diagnosis not present

## 2017-01-09 DIAGNOSIS — I481 Persistent atrial fibrillation: Secondary | ICD-10-CM | POA: Diagnosis not present

## 2017-01-09 DIAGNOSIS — Z885 Allergy status to narcotic agent status: Secondary | ICD-10-CM | POA: Diagnosis not present

## 2017-01-09 DIAGNOSIS — Z7982 Long term (current) use of aspirin: Secondary | ICD-10-CM

## 2017-01-09 DIAGNOSIS — I5031 Acute diastolic (congestive) heart failure: Secondary | ICD-10-CM

## 2017-01-09 DIAGNOSIS — Z881 Allergy status to other antibiotic agents status: Secondary | ICD-10-CM

## 2017-01-09 DIAGNOSIS — Z882 Allergy status to sulfonamides status: Secondary | ICD-10-CM

## 2017-01-09 DIAGNOSIS — R000 Tachycardia, unspecified: Secondary | ICD-10-CM | POA: Diagnosis not present

## 2017-01-09 DIAGNOSIS — I10 Essential (primary) hypertension: Secondary | ICD-10-CM

## 2017-01-09 DIAGNOSIS — Z8249 Family history of ischemic heart disease and other diseases of the circulatory system: Secondary | ICD-10-CM

## 2017-01-09 LAB — BASIC METABOLIC PANEL
ANION GAP: 7 (ref 5–15)
BUN: 21 mg/dL — ABNORMAL HIGH (ref 6–20)
CALCIUM: 8.6 mg/dL — AB (ref 8.9–10.3)
CO2: 22 mmol/L (ref 22–32)
Chloride: 110 mmol/L (ref 101–111)
Creatinine, Ser: 1.07 mg/dL (ref 0.61–1.24)
GLUCOSE: 155 mg/dL — AB (ref 65–99)
POTASSIUM: 5.1 mmol/L (ref 3.5–5.1)
Sodium: 139 mmol/L (ref 135–145)

## 2017-01-09 LAB — CBC
HEMATOCRIT: 34.3 % — AB (ref 39.0–52.0)
HEMOGLOBIN: 11.1 g/dL — AB (ref 13.0–17.0)
MCH: 32.3 pg (ref 26.0–34.0)
MCHC: 32.4 g/dL (ref 30.0–36.0)
MCV: 99.7 fL (ref 78.0–100.0)
Platelets: 392 10*3/uL (ref 150–400)
RBC: 3.44 MIL/uL — AB (ref 4.22–5.81)
RDW: 13.4 % (ref 11.5–15.5)
WBC: 8.9 10*3/uL (ref 4.0–10.5)

## 2017-01-09 LAB — BRAIN NATRIURETIC PEPTIDE: B NATRIURETIC PEPTIDE 5: 169 pg/mL — AB (ref 0.0–100.0)

## 2017-01-09 LAB — TROPONIN I

## 2017-01-09 MED ORDER — IOPAMIDOL (ISOVUE-370) INJECTION 76%
100.0000 mL | Freq: Once | INTRAVENOUS | Status: AC | PRN
  Administered 2017-01-09: 100 mL via INTRAVENOUS

## 2017-01-09 MED ORDER — ALBUTEROL SULFATE (2.5 MG/3ML) 0.083% IN NEBU: 5.0000 mg | INHALATION_SOLUTION | Freq: Once | RESPIRATORY_TRACT | Status: DC

## 2017-01-09 MED ORDER — SODIUM CHLORIDE 0.9 % IV SOLN
INTRAVENOUS | Status: DC
  Administered 2017-01-09: 23:00:00 via INTRAVENOUS

## 2017-01-09 MED ORDER — FUROSEMIDE 10 MG/ML IJ SOLN
40.0000 mg | Freq: Once | INTRAMUSCULAR | Status: AC
  Administered 2017-01-10: 40 mg via INTRAVENOUS
  Filled 2017-01-09: qty 4

## 2017-01-09 NOTE — ED Triage Notes (Signed)
Patient complaining of shortness of breath with exertion.  States that he had chest pain earlier today.  Recently diagnosed with a-fib.  Patient has diminished lung sounds bi-laterally.

## 2017-01-09 NOTE — Telephone Encounter (Signed)
I would advise that he follow-up in the AF clinic with Butch Penny and with Dr Gwenlyn Found.

## 2017-01-09 NOTE — ED Triage Notes (Signed)
SOB with exertion. Denies cp at present, but states that he has had some.

## 2017-01-09 NOTE — ED Provider Notes (Signed)
[facility] DEPT Provider Note   CSN: [id number] Arrival date & time: 01/09/17  1913     History   Chief Complaint Chief Complaint  Patient presents with  . Shortness of Breath    HPI [name] is a 83 y.o. male.  Patient was significant exertional shortness of breath since Tuesday afternoon. Associated with some left anterior chest pain throughout the week. Patient has a known history of atrial fibrillation. Is on Toprol-XL for this. Patient is taking that. Patient is not on blood thinners as was a conscious decision may be doing him and his cardiologist. Patient is not known to have a history of CHF. Not known to have a history of exertional shortness of breath. Patient does have leg swelling at times does have leg swelling currently.      Past Medical History:  Diagnosis Date  . Allergic rhinitis   . Asthmatic bronchitis   . Atrial fibrillation ([place])   . Celiac disease   . Cellulitis   . Diverticulosis   . DM (diabetes mellitus) ([place])   . Fx. left wrist 1987  . Gastric polyps   . GERD (gastroesophageal reflux disease)   . History of pneumonia    w/pleural effusion  . Hypothyroidism   . Iron deficiency anemia   . Melanoma ([place])   . Neuropathy ([place])   . Prostate cancer ([place])   . REM sleep behavior disorder   . RLS (restless legs syndrome)   . Sleep apnea     Patient Active Problem List   Diagnosis Date Noted  . Claudication ([place]) 07/25/2016  . Paroxysmal atrial fibrillation ([place]) 07/25/2016  . Odynophagia 04/10/2014  . Allergic rhinitis 03/23/2014  . Chronic rhinitis 12/22/2013  . 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 Constipation 08/22/2011  . OBESITY 09/20/2009  . G E R D 11/29/2007  . Celiac disease 11/29/2007    Past Surgical History:  Procedure Laterality Date  . APPENDECTOMY    . CARPAL TUNNEL RELEASE    . CATARACT EXTRACTION  03/2011   bilateral   . CHOLECYSTECTOMY  2001  . COLONOSCOPY W/ BIOPSIES  04/27/2008   diverticulosis, duodenitis    . FOOT SURGERY    . HERNIA REPAIR  1994   bilateral  . KNEE SURGERY     x 2  . LUNG SURGERY  2001  . PROSTATECTOMY  2002  . UPPER GASTROINTESTINAL ENDOSCOPY  04/27/2008   celiac disease, gastric polyps       Home Medications    Prior to Admission medications   Medication Sig Start Date End Date Taking? Authorizing Provider  Alpha-Lipoic Acid 300 MG CAPS Take 1 capsule by mouth daily.     Yes Historical Provider, MD  amitriptyline (ELAVIL) 75 MG tablet Take 75 mg by mouth at bedtime.    Yes Historical Provider, MD  Ascorbic Acid (VITAMIN C) 1000 MG tablet Take 1,000 mg by mouth 2 (two) times daily.     Yes Historical Provider, MD  aspirin 81 MG tablet Take 81 mg by mouth at bedtime.    Yes Historical Provider, MD  azelastine (ASTELIN) 0.1 % nasal spray Place 1 spray into both nostrils at bedtime.  04/25/14  Yes Historical Provider, MD  Cholecalciferol (VITAMIN D3) 1000 UNITS CAPS Take 1 tablet by mouth 3 (three) times daily.    Yes Historical Provider, MD  Coenzyme Q10 (COQ10) 200 MG CAPS Take 1 capsule by mouth daily.     Yes Historical Provider, MD  fluticasone (FLONASE) 50 MCG/ACT nasal  spray Place 1 spray into both nostrils at bedtime.  07/28/16  Yes Historical Provider, MD  levocetirizine (XYZAL) 5 MG tablet Take 5 mg by mouth at bedtime.  04/21/14  Yes Historical Provider, MD  levothyroxine (SYNTHROID, LEVOTHROID) 150 MCG tablet Take 150 mcg by mouth daily before breakfast.   Yes Historical Provider, MD  losartan (COZAAR) 100 MG tablet Take 50 mg by mouth daily.    Yes Historical Provider, MD  magnesium gluconate (MAGONATE) 500 MG tablet Take 500 mg by mouth daily.     Yes Historical Provider, MD  Melatonin 3 MG TABS Take 1 tablet by mouth at bedtime.    Yes Historical Provider, MD  meloxicam (MOBIC) 15 MG tablet Take 15 mg by mouth daily.   Yes Historical Provider, MD  metoprolol succinate (TOPROL-XL) 50 MG 24 hr tablet TAKE 1 AND 1/2 TABLET BY MOUTH DAILY 08/13/16  Yes Almyra Deforest,  PA  Multiple Vitamin (MULTIVITAMIN) tablet Take 1 tablet by mouth daily.     Yes Historical Provider, MD  Omega-3 Fatty Acids (FISH OIL) 1000 MG CAPS Take 1 capsule by mouth daily.   Yes Historical Provider, MD  pantoprazole (PROTONIX) 40 MG tablet Take 40 mg by mouth daily.   Yes Historical Provider, MD  Red Yeast Rice Extract (RED YEAST RICE PO) Take 1 tablet by mouth daily.   Yes Historical Provider, MD  senna (SENOKOT) 8.6 MG tablet Take 1 tablet by mouth daily as needed for constipation.    Yes Historical Provider, MD    Family History Family History  Problem Relation Age of Onset  . Breast cancer Mother   . Kidney failure Father   . Heart disease Father   . Rheumatic fever Father   . Colon cancer      Social History Social History  Substance Use Topics  . Smoking status: Never Smoker  . Smokeless tobacco: Never Used  . Alcohol use No     Allergies   Clindamycin; Hydrocodone; Penicillins; Procaine; Procaine hcl; Sulfonamide derivatives; Cephalexin; and Mold extract [trichophyton]   Review of Systems Review of Systems  Constitutional: Negative for fever.  HENT: Negative for congestion.   Eyes: Negative for visual disturbance.  Respiratory: Positive for shortness of breath.   Cardiovascular: Positive for chest pain.  Gastrointestinal: Negative for abdominal pain.  Genitourinary: Negative for dysuria.  Musculoskeletal: Negative for back pain.  Neurological: Negative for syncope and headaches.  Hematological: Does not bruise/bleed easily.  Psychiatric/Behavioral: Negative for confusion.     Physical Exam Updated Vital Signs BP (!) 146/65   Pulse 96   Temp 98.3 F (36.8 C) (Oral)   Resp 17   Ht 5\' 10"  (1.778 m)   Wt 102.5 kg   SpO2 99%   BMI 32.43 kg/m   Physical Exam  Constitutional: He is oriented to person, place, and time. He appears well-developed and well-nourished. No distress.  HENT:  Head: Normocephalic and atraumatic.  Mouth/Throat: Oropharynx  is clear and moist.  Eyes: EOM are normal. Pupils are equal, round, and reactive to light.  Neck: Normal range of motion. Neck supple.  Cardiovascular:  Irregular tachycardic  Pulmonary/Chest: Effort normal and breath sounds normal.  But  decreased breath sounds bilaterally  Abdominal: Soft. Bowel sounds are normal. There is no tenderness.  Musculoskeletal: He exhibits edema.  Neurological: He is alert and oriented to person, place, and time. No cranial nerve deficit or sensory deficit. He exhibits normal muscle tone. Coordination normal.  Skin: Skin is warm.  Nursing  note and vitals reviewed.    ED Treatments / Results  Labs (all labs ordered are listed, but only abnormal results are displayed) Labs Reviewed  BASIC METABOLIC PANEL - Abnormal; Notable for the following:       Result Value   Glucose, Bld 155 (*)    BUN 21 (*)    Calcium 8.6 (*)    All other components within normal limits  CBC - Abnormal; Notable for the following:    RBC 3.44 (*)    Hemoglobin 11.1 (*)    HCT 34.3 (*)    All other components within normal limits  BRAIN NATRIURETIC PEPTIDE - Abnormal; Notable for the following:    B Natriuretic Peptide 169.0 (*)    All other components within normal limits  TROPONIN I    EKG  EKG Interpretation  Date/Time:  Friday January 09 2017 19:24:17 EDT Ventricular Rate:  104 PR Interval:    QRS Duration: 128 QT Interval:  312 QTC Calculation: 410 R Axis:   50 Text Interpretation:  Atrial fibrillation with rapid ventricular response Right bundle branch block Abnormal ECG Confirmed by Abdul Beirne  MD, Keric Zehren [phone]) on 01/09/2017 9:36:14 PM       Radiology Dg Chest 2 View  Result Date: 01/09/2017 CLINICAL DATA:  Acute onset of shortness of breath. Initial encounter. EXAM: CHEST  2 VIEW COMPARISON:  Chest radiograph performed 11/07/2012 FINDINGS: The lungs are well-aerated. Vascular congestion is noted. Increased interstitial markings raise concern for mild  pulmonary edema. Small bilateral pleural effusions are noted. There is no evidence of pneumothorax. The heart is borderline normal in size. No acute osseous abnormalities are seen. IMPRESSION: Vascular congestion. Increased interstitial markings raise concern for mild pulmonary edema. Electronically Signed   By: Garald Balding M.D.   On: 01/09/2017 19:51   Ct Angio Chest Pe W Or Wo Contrast  Result Date: 01/09/2017 CLINICAL DATA:  Shortness of breath. Recent diagnosis of atrial fibrillation. EXAM: CT ANGIOGRAPHY CHEST WITH CONTRAST TECHNIQUE: Multidetector CT imaging of the chest was performed using the standard protocol during bolus administration of intravenous contrast. Multiplanar CT image reconstructions and MIPs were obtained to evaluate the vascular anatomy. CONTRAST:  100 cc Isovue 370 IV COMPARISON:  Chest radiograph earlier this day. Remote chest CTA 11/14/2003 FINDINGS: Cardiovascular: There are no filling defects within the pulmonary arteries to suggest pulmonary embolus. Prominent pulmonary artery measuring 3.9 cm. Mild atherosclerosis of the thoracic aorta without acute dissection. No aneurysm. Mild multi chamber cardiomegaly. Coronary artery calcifications. Aortic valvular calcifications. Mediastinum/Nodes: Small mediastinal lymph nodes, largest in the lower paratracheal region measured 10 mm. No hilar adenopathy. No pericardial fluid. Lungs/Pleura: Probable chain suture at the right costophrenic angle and postsurgical change in the lateral right mid lung. Small bilateral pleural effusions. Mild smooth septal thickening consistent with pulmonary edema. There is central bronchial thickening. Scattered perivascular ground-glass opacities. Nodular perifissural opacity in the right middle lobe measures 11 x 6 mm. A few tiny subpleural nodules are seen for example right upper lobe image 19 series 6, this nodule stable from prior. Left upper lobe image 27. Left upper lobe image 44. Left lung nodules are  new. No dominant pulmonary mass. Upper Abdomen: No acute abnormality. Postcholecystectomy. Tiny accessory arteries arise from the upper abdominal aorta. Musculoskeletal: Probable vertebral body hemangioma within T8, unchanged. There is degenerative change in the spine. Review of the MIP images confirms the above findings. IMPRESSION: 1. No pulmonary embolus. 2. Mild multi chamber cardiomegaly and pulmonary edema. Small pleural effusions.  Findings may reflect mild CHF. 3. Postsurgical change in the right hemithorax. 4. Tiny subpleural nodules and perifissural thickening in the right lung, suspect pulmonary lymph nodes, however are nonspecific. No follow-up needed if patient is low-risk (and has no known or suspected primary neoplasm). Non-contrast chest CT can be considered in 12 months if patient is high-risk. Electronically Signed   By: Jeb Levering M.D.   On: 01/09/2017 22:58    Procedures Procedures (including critical care time)  Medications Ordered in ED Medications  0.9 %  sodium chloride infusion ( Intravenous New Bag/Given 01/09/17 2249)  iopamidol (ISOVUE-370) 76 % injection 100 mL (100 mLs Intravenous Contrast Given 01/09/17 2228)     Initial Impression / Assessment and Plan / ED Course  I have reviewed the triage vital signs and the nursing notes.  Pertinent labs & imaging results that were available during my care of the patient were reviewed by me and considered in my medical decision making (see chart for details).    Patient presents with worsening exertional shortness of breath Tuesday afternoon. Associated with some chest pain. Patient has a known history of atrial fibrillation in consultation with his cardiologist they opted not to put him on blood thinners.  However the exertional shortness of breath and the chest pain or new findings.  Chest x-ray without significant findings CT angios done to rule out pulmonary embolus show some pleural effusion and mild congestive heart  failure. Patient's BNP not significant elevated troponin negative. Patient not known to have a history of CHF.  Concern is his 33s had a persistent low grade tachycardia 100 to 115 and his had some mild elevation in his blood pressure 245-809 systolic.  Feelings be best for patient to be admitted and have the heart rate to and perhaps the blood pressure tended may be started on a diuretic if he does well overnight perhaps could be discharged home. Discussed with the hospitalist. He is willing to have him admitted and will do admit orders. Patient's family is fine with this. Final Clinical Impressions(s) / ED Diagnoses   Final diagnoses:  SOB (shortness of breath)  Atrial fibrillation with rapid ventricular response (HCC)  Essential hypertension  Acute congestive heart failure, unspecified congestive heart failure type [facility])    New Prescriptions New Prescriptions   No medications on file     Fredia Sorrow, MD 01/09/17 2357

## 2017-01-09 NOTE — Telephone Encounter (Signed)
Waiting to hear from Dr. Rayann Heman for ok to evaluate, then will send to scheduling.

## 2017-01-10 ENCOUNTER — Encounter (HOSPITAL_COMMUNITY): Payer: Self-pay | Admitting: Emergency Medicine

## 2017-01-10 DIAGNOSIS — I5031 Acute diastolic (congestive) heart failure: Secondary | ICD-10-CM | POA: Diagnosis not present

## 2017-01-10 DIAGNOSIS — Z9049 Acquired absence of other specified parts of digestive tract: Secondary | ICD-10-CM | POA: Diagnosis not present

## 2017-01-10 DIAGNOSIS — I480 Paroxysmal atrial fibrillation: Secondary | ICD-10-CM | POA: Diagnosis not present

## 2017-01-10 DIAGNOSIS — E039 Hypothyroidism, unspecified: Secondary | ICD-10-CM | POA: Diagnosis not present

## 2017-01-10 DIAGNOSIS — Z881 Allergy status to other antibiotic agents status: Secondary | ICD-10-CM | POA: Diagnosis not present

## 2017-01-10 DIAGNOSIS — Z8546 Personal history of malignant neoplasm of prostate: Secondary | ICD-10-CM | POA: Diagnosis not present

## 2017-01-10 DIAGNOSIS — E669 Obesity, unspecified: Secondary | ICD-10-CM | POA: Diagnosis not present

## 2017-01-10 DIAGNOSIS — I482 Chronic atrial fibrillation: Secondary | ICD-10-CM | POA: Diagnosis not present

## 2017-01-10 DIAGNOSIS — Z7982 Long term (current) use of aspirin: Secondary | ICD-10-CM | POA: Diagnosis not present

## 2017-01-10 DIAGNOSIS — Z885 Allergy status to narcotic agent status: Secondary | ICD-10-CM | POA: Diagnosis not present

## 2017-01-10 DIAGNOSIS — I4891 Unspecified atrial fibrillation: Secondary | ICD-10-CM | POA: Diagnosis not present

## 2017-01-10 DIAGNOSIS — I509 Heart failure, unspecified: Secondary | ICD-10-CM

## 2017-01-10 DIAGNOSIS — G4752 REM sleep behavior disorder: Secondary | ICD-10-CM | POA: Diagnosis not present

## 2017-01-10 DIAGNOSIS — Z880 Allergy status to penicillin: Secondary | ICD-10-CM | POA: Diagnosis not present

## 2017-01-10 DIAGNOSIS — I481 Persistent atrial fibrillation: Secondary | ICD-10-CM | POA: Diagnosis not present

## 2017-01-10 DIAGNOSIS — R0602 Shortness of breath: Secondary | ICD-10-CM | POA: Diagnosis present

## 2017-01-10 DIAGNOSIS — Z888 Allergy status to other drugs, medicaments and biological substances status: Secondary | ICD-10-CM | POA: Diagnosis not present

## 2017-01-10 DIAGNOSIS — E1140 Type 2 diabetes mellitus with diabetic neuropathy, unspecified: Secondary | ICD-10-CM | POA: Diagnosis not present

## 2017-01-10 DIAGNOSIS — I5033 Acute on chronic diastolic (congestive) heart failure: Secondary | ICD-10-CM | POA: Diagnosis not present

## 2017-01-10 DIAGNOSIS — K219 Gastro-esophageal reflux disease without esophagitis: Secondary | ICD-10-CM | POA: Diagnosis not present

## 2017-01-10 DIAGNOSIS — E1151 Type 2 diabetes mellitus with diabetic peripheral angiopathy without gangrene: Secondary | ICD-10-CM | POA: Diagnosis not present

## 2017-01-10 DIAGNOSIS — K900 Celiac disease: Secondary | ICD-10-CM | POA: Diagnosis not present

## 2017-01-10 DIAGNOSIS — Z9079 Acquired absence of other genital organ(s): Secondary | ICD-10-CM | POA: Diagnosis not present

## 2017-01-10 DIAGNOSIS — J45909 Unspecified asthma, uncomplicated: Secondary | ICD-10-CM | POA: Diagnosis not present

## 2017-01-10 DIAGNOSIS — Z85820 Personal history of malignant melanoma of skin: Secondary | ICD-10-CM | POA: Diagnosis not present

## 2017-01-10 DIAGNOSIS — Z79899 Other long term (current) drug therapy: Secondary | ICD-10-CM | POA: Diagnosis not present

## 2017-01-10 DIAGNOSIS — G4730 Sleep apnea, unspecified: Secondary | ICD-10-CM | POA: Diagnosis not present

## 2017-01-10 DIAGNOSIS — I110 Hypertensive heart disease with heart failure: Secondary | ICD-10-CM | POA: Diagnosis not present

## 2017-01-10 DIAGNOSIS — G2581 Restless legs syndrome: Secondary | ICD-10-CM | POA: Diagnosis not present

## 2017-01-10 DIAGNOSIS — Z882 Allergy status to sulfonamides status: Secondary | ICD-10-CM | POA: Diagnosis not present

## 2017-01-10 LAB — BASIC METABOLIC PANEL
Anion gap: 9 (ref 5–15)
BUN: 23 mg/dL — AB (ref 6–20)
CALCIUM: 8.8 mg/dL — AB (ref 8.9–10.3)
CHLORIDE: 105 mmol/L (ref 101–111)
CO2: 24 mmol/L (ref 22–32)
CREATININE: 1.15 mg/dL (ref 0.61–1.24)
GFR calc Af Amer: >60 mL/min (ref >60)
GFR calc non Af Amer: 56 mL/min — ABNORMAL LOW (ref >60)
GLUCOSE: 133 mg/dL — AB (ref 65–99)
Potassium: 4.2 mmol/L (ref 3.5–5.1)
Sodium: 138 mmol/L (ref 135–145)

## 2017-01-10 LAB — TROPONIN I
Troponin I: <0.03 ng/mL (ref <0.03)
Troponin I: <0.03 ng/mL (ref <0.03)

## 2017-01-10 LAB — GLUCOSE, CAPILLARY
GLUCOSE-CAPILLARY: 113 mg/dL — AB (ref 65–99)
Glucose-Capillary: 152 mg/dL — ABNORMAL HIGH (ref 65–99)

## 2017-01-10 LAB — TSH: TSH: 4.678 u[IU]/mL — ABNORMAL HIGH (ref 0.350–4.500)

## 2017-01-10 MED ORDER — LOSARTAN POTASSIUM 50 MG PO TABS
50.0000 mg | ORAL_TABLET | Freq: Every day | ORAL | Status: DC
  Administered 2017-01-10 – 2017-01-12 (×3): 50 mg via ORAL
  Filled 2017-01-10 (×3): qty 1

## 2017-01-10 MED ORDER — AZELASTINE HCL 0.1 % NA SOLN
1.0000 | Freq: Every day | NASAL | Status: DC
  Administered 2017-01-10 – 2017-01-11 (×3): 1 via NASAL
  Filled 2017-01-10: qty 30

## 2017-01-10 MED ORDER — ALPHA-LIPOIC ACID 300 MG PO CAPS: 1.0000 | ORAL_CAPSULE | Freq: Every day | ORAL | Status: DC

## 2017-01-10 MED ORDER — FUROSEMIDE 10 MG/ML IJ SOLN
40.0000 mg | Freq: Every day | INTRAMUSCULAR | Status: DC
  Administered 2017-01-10 – 2017-01-12 (×3): 40 mg via INTRAVENOUS
  Filled 2017-01-10 (×3): qty 4

## 2017-01-10 MED ORDER — VITAMIN C 500 MG PO TABS
1000.0000 mg | ORAL_TABLET | Freq: Two times a day (BID) | ORAL | Status: DC
  Administered 2017-01-10 – 2017-01-12 (×6): 1000 mg via ORAL
  Filled 2017-01-10 (×6): qty 2

## 2017-01-10 MED ORDER — COQ10 200 MG PO CAPS: 1.0000 | ORAL_CAPSULE | Freq: Every day | ORAL | Status: DC

## 2017-01-10 MED ORDER — LEVOTHYROXINE SODIUM 75 MCG PO TABS
150.0000 ug | ORAL_TABLET | Freq: Every day | ORAL | Status: DC
  Administered 2017-01-10 – 2017-01-12 (×3): 150 ug via ORAL
  Filled 2017-01-10 (×3): qty 2

## 2017-01-10 MED ORDER — PANTOPRAZOLE SODIUM 40 MG PO TBEC
40.0000 mg | DELAYED_RELEASE_TABLET | Freq: Every day | ORAL | Status: DC
  Administered 2017-01-10 – 2017-01-12 (×3): 40 mg via ORAL
  Filled 2017-01-10 (×3): qty 1

## 2017-01-10 MED ORDER — FLUTICASONE PROPIONATE 50 MCG/ACT NA SUSP
1.0000 | Freq: Every day | NASAL | Status: DC
  Administered 2017-01-10 – 2017-01-11 (×3): 1 via NASAL
  Filled 2017-01-10: qty 16

## 2017-01-10 MED ORDER — LEVOCETIRIZINE DIHYDROCHLORIDE 5 MG PO TABS: 5.0000 mg | ORAL_TABLET | Freq: Every day | ORAL | Status: DC

## 2017-01-10 MED ORDER — LORATADINE 10 MG PO TABS
10.0000 mg | ORAL_TABLET | Freq: Every day | ORAL | Status: DC
  Administered 2017-01-10 – 2017-01-12 (×3): 10 mg via ORAL
  Filled 2017-01-10 (×3): qty 1

## 2017-01-10 MED ORDER — MELATONIN 3 MG PO TABS
1.0000 | ORAL_TABLET | Freq: Every day | ORAL | Status: DC
  Filled 2017-01-10 (×3): qty 1

## 2017-01-10 MED ORDER — AZELASTINE HCL 0.1 % NA SOLN
NASAL | Status: AC
  Filled 2017-01-10: qty 30

## 2017-01-10 MED ORDER — ENOXAPARIN SODIUM 40 MG/0.4ML ~~LOC~~ SOLN
40.0000 mg | SUBCUTANEOUS | Status: DC
  Administered 2017-01-10 – 2017-01-11 (×3): 40 mg via SUBCUTANEOUS
  Filled 2017-01-10 (×3): qty 0.4

## 2017-01-10 MED ORDER — SENNA 8.6 MG PO TABS: 1.0000 | ORAL_TABLET | Freq: Every day | ORAL | Status: DC | PRN

## 2017-01-10 MED ORDER — AMITRIPTYLINE HCL 25 MG PO TABS
75.0000 mg | ORAL_TABLET | Freq: Every day | ORAL | Status: DC
Start: 2017-01-10 — End: 2017-01-12
  Administered 2017-01-10 – 2017-01-11 (×3): 75 mg via ORAL
  Filled 2017-01-10 (×3): qty 3

## 2017-01-10 MED ORDER — ASPIRIN EC 81 MG PO TBEC
81.0000 mg | DELAYED_RELEASE_TABLET | Freq: Every day | ORAL | Status: DC
  Administered 2017-01-10 – 2017-01-11 (×3): 81 mg via ORAL
  Filled 2017-01-10 (×3): qty 1

## 2017-01-10 MED ORDER — METOPROLOL SUCCINATE ER 50 MG PO TB24
50.0000 mg | ORAL_TABLET | Freq: Two times a day (BID) | ORAL | Status: DC
  Administered 2017-01-10 – 2017-01-12 (×6): 50 mg via ORAL
  Filled 2017-01-10 (×6): qty 1

## 2017-01-10 MED ORDER — OMEGA-3-ACID ETHYL ESTERS 1 G PO CAPS
1.0000 g | ORAL_CAPSULE | Freq: Two times a day (BID) | ORAL | Status: DC
  Administered 2017-01-10 – 2017-01-12 (×6): 1 g via ORAL
  Filled 2017-01-10 (×6): qty 1

## 2017-01-10 NOTE — Progress Notes (Signed)
PHARMACIST - PHYSICIAN ORDER COMMUNICATION  CONCERNING: P&T Medication Policy on Herbal Medications  DESCRIPTION:  This patient's order for:  CoQ10, melatin, alpha lipoic acid  has been noted.  This product(s) is classified as an "herbal" or natural product. Due to a lack of definitive safety studies or FDA approval, nonstandard manufacturing practices, plus the potential risk of unknown drug-drug interactions while on inpatient medications, the Pharmacy and Therapeutics Committee does not permit the use of "herbal" or natural products of this type within [facility].   ACTION TAKEN: The pharmacy department is unable to verify this order at this time and your patient has been informed of this safety policy. Please reevaluate patient's clinical condition at discharge and address if the herbal or natural product(s) should be resumed at that time.

## 2017-01-10 NOTE — H&P (Signed)
History and Physical    [name] IRJ:[id number] DOB: [birth year] DOA: 01/09/2017  PCP: Wende Neighbors, MD  Patient coming from: Home.    Chief Complaint:   DOE.   HPI: [name] is an 83 y.o. male with hx of afib, recently diagnosed, on ASA only has he has fall risk, normal EF and mild AS, followed by Dr Gwenlyn Found, hx of DM, hypothryodism, asthmatic bronchitis, non smoker, presented to the ER with DOE for the past 4-5 days, and ankle swelling.  Evaluation in the ER showed CXR with some vascular congestion, CTA with no PE or infiltrates, and BNP was low.  He was noted to have slight  HTN with SBP 150, and HR 110.  Ox saturation was 98 percent.  Hospitalist was asked to admit him for Afib with RVR, atypical chest tightness, and acute diastolic CHF.    ED Course:  See above.  Rewiew of Systems:  Constitutional: Negative for malaise, fever and chills. No significant weight loss or weight gain Eyes: Negative for eye pain, redness and discharge, diplopia, visual changes, or flashes of light. ENMT: Negative for ear pain, hoarseness, nasal congestion, sinus pressure and sore throat. No headaches; tinnitus, drooling, or problem swallowing. Cardiovascular: Negative for chest pain, palpitations, diaphoresis,  and peripheral edema. ; No orthopnea, PND Respiratory: Negative for cough, hemoptysis, wheezing and stridor. No pleuritic chestpain. Gastrointestinal: Negative for diarrhea, constipation,  melena, blood in stool, hematemesis, jaundice and rectal bleeding.    Genitourinary: Negative for frequency, dysuria, incontinence,flank pain and hematuria; Musculoskeletal: Negative for back pain and neck pain. Negative for swelling and trauma.;  Skin: . Negative for pruritus, rash, abrasions, bruising and skin lesion.; ulcerations Neuro: Negative for headache, lightheadedness and neck stiffness. Negative for weakness, altered level of consciousness , altered mental status, extremity weakness, burning feet,  involuntary movement, seizure and syncope.  Psych: negative for anxiety, depression, insomnia, tearfulness, panic attacks, hallucinations, paranoia, suicidal or homicidal ideation   Past Medical History:  Diagnosis Date  . Allergic rhinitis   . Asthmatic bronchitis   . Atrial fibrillation ([place])   . Celiac disease   . Cellulitis   . Diverticulosis   . DM (diabetes mellitus) ([place])   . Fx. left wrist 1987  . Gastric polyps   . GERD (gastroesophageal reflux disease)   . History of pneumonia    w/pleural effusion  . Hypothyroidism   . Iron deficiency anemia   . Melanoma ([place])   . Neuropathy ([place])   . Prostate cancer ([place])   . REM sleep behavior disorder   . RLS (restless legs syndrome)   . Sleep apnea    Past Surgical History:  Procedure Laterality Date  . APPENDECTOMY    . CARPAL TUNNEL RELEASE    . CATARACT EXTRACTION  03/2011   bilateral   . CHOLECYSTECTOMY  2001  . COLONOSCOPY W/ BIOPSIES  04/27/2008   diverticulosis, duodenitis  . FOOT SURGERY    . HERNIA REPAIR  1994   bilateral  . KNEE SURGERY     x 2  . LUNG SURGERY  2001  . PROSTATECTOMY  2002  . UPPER GASTROINTESTINAL ENDOSCOPY  04/27/2008   celiac disease, gastric polyps     reports that he has never smoked. He has never used smokeless tobacco. He reports that he does not drink alcohol or use drugs.  Allergies  Allergen Reactions  . Clindamycin Diarrhea  . Hydrocodone   . Penicillins     Has patient had a PCN reaction  causing immediate rash, facial/tongue/throat swelling, SOB or lightheadedness with hypotension: No Has patient had a PCN reaction causing severe rash involving mucus membranes or skin necrosis: No Has patient had a PCN reaction that required hospitalization No Has patient had a PCN reaction occurring within the last 10 years: No If all of the above answers are "NO", then may proceed with Cephalosporin use.   . Procaine Other (See Comments)    "Passes out"  . Procaine Hcl   . Sulfonamide  Derivatives   . Cephalexin Nausea And Vomiting    Dry hives, "violent" diarhea  . Mold Extract [Trichophyton] Other (See Comments)    Stuffiness, post nasal drip    Family History  Problem Relation Age of Onset  . Breast cancer Mother   . Kidney failure Father   . Heart disease Father   . Rheumatic fever Father   . Colon cancer       Prior to Admission medications   Medication Sig Start Date End Date Taking? Authorizing Provider  Alpha-Lipoic Acid 300 MG CAPS Take 1 capsule by mouth daily.     Yes Historical Provider, MD  amitriptyline (ELAVIL) 75 MG tablet Take 75 mg by mouth at bedtime.    Yes Historical Provider, MD  Ascorbic Acid (VITAMIN C) 1000 MG tablet Take 1,000 mg by mouth 2 (two) times daily.     Yes Historical Provider, MD  aspirin 81 MG tablet Take 81 mg by mouth at bedtime.    Yes Historical Provider, MD  azelastine (ASTELIN) 0.1 % nasal spray Place 1 spray into both nostrils at bedtime.  04/25/14  Yes Historical Provider, MD  Cholecalciferol (VITAMIN D3) 1000 UNITS CAPS Take 1 tablet by mouth 3 (three) times daily.    Yes Historical Provider, MD  Coenzyme Q10 (COQ10) 200 MG CAPS Take 1 capsule by mouth daily.     Yes Historical Provider, MD  fluticasone (FLONASE) 50 MCG/ACT nasal spray Place 1 spray into both nostrils at bedtime.  07/28/16  Yes Historical Provider, MD  levocetirizine (XYZAL) 5 MG tablet Take 5 mg by mouth at bedtime.  04/21/14  Yes Historical Provider, MD  levothyroxine (SYNTHROID, LEVOTHROID) 150 MCG tablet Take 150 mcg by mouth daily before breakfast.   Yes Historical Provider, MD  losartan (COZAAR) 100 MG tablet Take 50 mg by mouth daily.    Yes Historical Provider, MD  magnesium gluconate (MAGONATE) 500 MG tablet Take 500 mg by mouth daily.     Yes Historical Provider, MD  Melatonin 3 MG TABS Take 1 tablet by mouth at bedtime.    Yes Historical Provider, MD  meloxicam (MOBIC) 15 MG tablet Take 15 mg by mouth daily.   Yes Historical Provider, MD    metoprolol succinate (TOPROL-XL) 50 MG 24 hr tablet TAKE 1 AND 1/2 TABLET BY MOUTH DAILY 08/13/16  Yes Almyra Deforest, PA  Multiple Vitamin (MULTIVITAMIN) tablet Take 1 tablet by mouth daily.     Yes Historical Provider, MD  Omega-3 Fatty Acids (FISH OIL) 1000 MG CAPS Take 1 capsule by mouth daily.   Yes Historical Provider, MD  pantoprazole (PROTONIX) 40 MG tablet Take 40 mg by mouth daily.   Yes Historical Provider, MD  Red Yeast Rice Extract (RED YEAST RICE PO) Take 1 tablet by mouth daily.   Yes Historical Provider, MD  senna (SENOKOT) 8.6 MG tablet Take 1 tablet by mouth daily as needed for constipation.    Yes Historical Provider, MD    Physical Exam: Vitals:  01/09/17 2330 01/09/17 2345 01/10/17 0000 01/10/17 0015  BP: (!) 146/65     Pulse: 96 (!) 109 (!) 103 72  Resp: 17 (!) 26 (!) 38 (!) 31  Temp:      TempSrc:      SpO2: 99% 100% 98% 97%  Weight:      Height:          Constitutional: NAD, calm, comfortable Vitals:   01/09/17 2330 01/09/17 2345 01/10/17 0000 01/10/17 0015  BP: (!) 146/65     Pulse: 96 (!) 109 (!) 103 72  Resp: 17 (!) 26 (!) 38 (!) 31  Temp:      TempSrc:      SpO2: 99% 100% 98% 97%  Weight:      Height:       Eyes: PERRL, lids and conjunctivae normal ENMT: Mucous membranes are moist. Posterior pharynx clear of any exudate or lesions.Normal dentition.  Neck: normal, supple, no masses, no thyromegaly Respiratory: no wheezing, but he has bilateral rales. Normal respiratory effort. No accessory muscle use.  Cardiovascular: Regular rate and rhythm, no murmurs / rubs / gallops. No extremity edema. 2+ pedal pulses. No carotid bruits.  Abdomen: no tenderness, no masses palpated. No hepatosplenomegaly. Bowel sounds positive.  Musculoskeletal: no clubbing / cyanosis. No joint deformity upper and lower extremities. Good ROM, no contractures. Normal muscle tone.  Skin: no rashes, lesions, ulcers. No induration Neurologic: CN 2-12 grossly intact. Sensation intact,  DTR normal. Strength 5/5 in all 4.  Psychiatric: Normal judgment and insight. Alert and oriented x 3. Normal mood.   Labs on Admission: I have personally reviewed following labs and imaging studies  CBC:  Recent Labs Lab 01/09/17 2004  WBC 8.9  HGB 11.1*  HCT 34.3*  MCV 99.7  PLT 740   Basic Metabolic Panel:  Recent Labs Lab 01/09/17 2004  NA 139  K 5.1  CL 110  CO2 22  GLUCOSE 155*  BUN 21*  CREATININE 1.07  CALCIUM 8.6*   Cardiac Enzymes:  Recent Labs Lab 01/09/17 2003  TROPONINI <0.03   Radiological Exams on Admission: Dg Chest 2 View  Result Date: 01/09/2017 CLINICAL DATA:  Acute onset of shortness of breath. Initial encounter. EXAM: CHEST  2 VIEW COMPARISON:  Chest radiograph performed 11/07/2012 FINDINGS: The lungs are well-aerated. Vascular congestion is noted. Increased interstitial markings raise concern for mild pulmonary edema. Small bilateral pleural effusions are noted. There is no evidence of pneumothorax. The heart is borderline normal in size. No acute osseous abnormalities are seen. IMPRESSION: Vascular congestion. Increased interstitial markings raise concern for mild pulmonary edema. Electronically Signed   By: Garald Balding M.D.   On: 01/09/2017 19:51   Ct Angio Chest Pe W Or Wo Contrast  Result Date: 01/09/2017 CLINICAL DATA:  Shortness of breath. Recent diagnosis of atrial fibrillation. EXAM: CT ANGIOGRAPHY CHEST WITH CONTRAST TECHNIQUE: Multidetector CT imaging of the chest was performed using the standard protocol during bolus administration of intravenous contrast. Multiplanar CT image reconstructions and MIPs were obtained to evaluate the vascular anatomy. CONTRAST:  100 cc Isovue 370 IV COMPARISON:  Chest radiograph earlier this day. Remote chest CTA 11/14/2003 FINDINGS: Cardiovascular: There are no filling defects within the pulmonary arteries to suggest pulmonary embolus. Prominent pulmonary artery measuring 3.9 cm. Mild atherosclerosis of the  thoracic aorta without acute dissection. No aneurysm. Mild multi chamber cardiomegaly. Coronary artery calcifications. Aortic valvular calcifications. Mediastinum/Nodes: Small mediastinal lymph nodes, largest in the lower paratracheal region measured 10 mm. No hilar  adenopathy. No pericardial fluid. Lungs/Pleura: Probable chain suture at the right costophrenic angle and postsurgical change in the lateral right mid lung. Small bilateral pleural effusions. Mild smooth septal thickening consistent with pulmonary edema. There is central bronchial thickening. Scattered perivascular ground-glass opacities. Nodular perifissural opacity in the right middle lobe measures 11 x 6 mm. A few tiny subpleural nodules are seen for example right upper lobe image 19 series 6, this nodule stable from prior. Left upper lobe image 27. Left upper lobe image 44. Left lung nodules are new. No dominant pulmonary mass. Upper Abdomen: No acute abnormality. Postcholecystectomy. Tiny accessory arteries arise from the upper abdominal aorta. Musculoskeletal: Probable vertebral body hemangioma within T8, unchanged. There is degenerative change in the spine. Review of the MIP images confirms the above findings. IMPRESSION: 1. No pulmonary embolus. 2. Mild multi chamber cardiomegaly and pulmonary edema. Small pleural effusions. Findings may reflect mild CHF. 3. Postsurgical change in the right hemithorax. 4. Tiny subpleural nodules and perifissural thickening in the right lung, suspect pulmonary lymph nodes, however are nonspecific. No follow-up needed if patient is low-risk (and has no known or suspected primary neoplasm). Non-contrast chest CT can be considered in 12 months if patient is high-risk. Electronically Signed   By: Jeb Levering M.D.   On: 01/09/2017 22:58    EKG: Independently reviewed.   Assessment/Plan Principal Problem:   Afib (HCC) Active Problems:   Obesity   SOB (shortness of breath)   Acute diastolic CHF  (congestive heart failure) (HCC)   Atrial fibrillation with RVR (HCC)    PLAN:   Afib with RVR:  His rate is slightly high at rest.  BP will tolerated it, so will increase Lopressor from 75mg  to 100mg  per day in BID dosing.  [place] with ASA, though he should ask his cardiologist about [place].  ECHO was done recently and OK, so will not repeat.   If his HR is controlled, he can be discharged to home.  Mild interstitial edema:  I think he has mild acute diastolic CHF.  Will give one dose of IV Lasix.  He has not needed oral lasix before.  Follow bmet in the am.   Hypothyroidism:  Will continue with oral supplement.  Check TSH.    DVT prophylaxis: lovenox.  Code Status: FULL CODE.  Family Communication: grand daughter who is a pediatrician, and wife, both at bedside.  Disposition Plan: home.  Consults called: None.  Admission status: inpatient.    Jordy Hewins MD FACP. [facility]  If 7PM-7AM, please contact night-coverage [email or web address] Password [facility]  01/10/2017, 12:34 AM

## 2017-01-10 NOTE — Progress Notes (Signed)
[name] is an 83 y.o. male with hx of afib, recently diagnosed, on ASA only has he has fall risk, normal EF and mild AS, followed by Dr Gwenlyn Found, hx of DM, hypothryodism, asthmatic bronchitis, non smoker, presented to the ER with DOE for the past 4-5 days, and ankle swelling.  Evaluation in the ER showed CXR with some vascular congestion, CTA with no PE or infiltrates,Hospitalist was asked to admit him for Afib with RVR, atypical chest tightness, and acute diastolic CHF.     Plan: 1. Start IV lasix 40 mg daily.  2. Check BMP in am.  3. Strict intake and output and daily weights.  4. Repeat echocardiogram.  5. Increase the metoprolol of 75 mg XL daily, to metoprolol 50 mg BID.  6. Ambulate in the hallway.    Hosie Poisson, MD [phone]

## 2017-01-11 ENCOUNTER — Inpatient Hospital Stay (HOSPITAL_COMMUNITY): Payer: PPO

## 2017-01-11 DIAGNOSIS — I4891 Unspecified atrial fibrillation: Secondary | ICD-10-CM

## 2017-01-11 LAB — BASIC METABOLIC PANEL
ANION GAP: 8 (ref 5–15)
BUN: 26 mg/dL — ABNORMAL HIGH (ref 6–20)
CALCIUM: 8.9 mg/dL (ref 8.9–10.3)
CO2: 25 mmol/L (ref 22–32)
Chloride: 106 mmol/L (ref 101–111)
Creatinine, Ser: 1.11 mg/dL (ref 0.61–1.24)
GFR calc Af Amer: >60 mL/min (ref >60)
GFR, EST NON AFRICAN AMERICAN: 59 mL/min — AB (ref >60)
GLUCOSE: 144 mg/dL — AB (ref 65–99)
POTASSIUM: 4.5 mmol/L (ref 3.5–5.1)
SODIUM: 139 mmol/L (ref 135–145)

## 2017-01-11 LAB — ECHOCARDIOGRAM COMPLETE
AO mean calculated velocity dopler: 168 cm/s
AOASC: 32 cm
AOVTI: 44.6 cm
AV Area VTI index: 0.69 cm2/m2
AV Area mean vel: 1.44 cm2
AV Peak grad: 25 mmHg
AV area mean vel ind: 0.65 cm2/m2
AV pk vel: 250 cm/s
AV vel: 1.52
AVAREAVTI: 1.3 cm2
AVCELMEANRAT: 0.38
AVG: 14 mmHg
AVPHT: 267 ms
Ao pk vel: 0.34 m/s
CHL CUP AV PEAK INDEX: 0.59
CHL CUP MV DEC (S): 229
E/e' ratio: 13.95
EWDT: 229 ms
FS: 31 % (ref 28–44)
Height: 70 in
IV/PV OW: 1.01
LA diam index: 1.72 cm/m2
LA vol A4C: 54.5 ml
LA vol index: 27.3 mL/m2
LA vol: 60.3 mL
LASIZE: 38 mm
LEFT ATRIUM END SYS DIAM: 38 mm
LV SIMPSON'S DISK: 66
LV TDI E'MEDIAL: 7.07
LV dias vol index: 36 mL/m2
LV dias vol: 80 mL (ref 62–150)
LV e' LATERAL: 9.68 cm/s
LV sys vol index: 12 mL/m2
LVEEAVG: 13.95
LVEEMED: 13.95
LVOT VTI: 17.8 cm
LVOT area: 3.8 cm2
LVOT diameter: 22 mm
LVOT peak VTI: 0.4 cm
LVOT peak grad rest: 3 mmHg
LVOT peak vel: 85.8 cm/s
LVOTSV: 68 mL
LVSYSVOL: 28 mL (ref 21–61)
Lateral S' vel: 13.7 cm/s
MV Peak grad: 7 mmHg
MVPKEVEL: 135 m/s
PW: 12.6 mm — AB (ref 0.6–1.1)
Stroke v: 52 ml
TAPSE: 18.6 mm
TDI e' lateral: 9.68
Valve area index: 0.69
Valve area: 1.52 cm2
Weight: 3641.6 oz

## 2017-01-11 MED ORDER — DILTIAZEM HCL 25 MG/5ML IV SOLN
10.0000 mg | Freq: Once | INTRAVENOUS | Status: AC
  Administered 2017-01-11: 10 mg via INTRAVENOUS
  Filled 2017-01-11: qty 5

## 2017-01-11 NOTE — Progress Notes (Signed)
Echo done.

## 2017-01-11 NOTE — Progress Notes (Signed)
PROGRESS NOTE    [name]  QZE:[id number] DOB: Aug 13, 1929 DOA: 01/09/2017 PCP: Wende Neighbors, MD    Brief Narrative: Calvin Klipfel Deckeris an 83 y.o.malewith hx of afib, recently diagnosed, on ASA only has he has fall risk, normal EF and mild AS, followed by Dr Gwenlyn Found, hx of DM, hypothryodism, asthmatic bronchitis, non smoker, presented to the ER with DOE for the past 4-5 days, and ankle swelling. Evaluation in the ER showed CXR with some vascular congestion, CTA with no PE or infiltrates,Hospitalist was asked to admit him for Afib with RVR, atypical chest tightness, and acute diastolic CHF  Assessment & Plan:   Principal Problem:   Afib ([place]) Active Problems:   Obesity   SOB (shortness of breath)   Acute diastolic CHF (congestive heart failure) (HCC)   Atrial fibrillation with RVR (HCC)   Atrial fibrillation:  Rate slightly up today, on metoprolol 50 mg bid. Currently on aspirin. ? Not on anticoagulation. Further follow up with cardiology as outpatient.    Acute on chronic diastolic heart failure:  Admitted to telemetry, IV lasix, 40 mg BID.  I/O last 3 completed shifts: In: 791.7 [P.O.:720; I.V.:71.7] Out: 2900 [Urine:2900] Total I/O In: -  Out: 1450 [Urine:1450]  MORE THAN 3 liters of diuresis since admission.   Creatinine stable around 1.1. Tomorrow we will cut down the lasix to daily, on discharge.    Sob: improved. Off oxygen. Stable on RA.  Check ambulating oxygen levels.    Hypertension; well controlled.    Hypothyroidism:  Resume synthroid.  tsh slightly elevated.        DVT prophylaxis: lovenox.  Code Status: (full code.  Family Communication: none at bedside.  Disposition Plan: pending further eval.    Consultants:   None.    Procedures: echocardiogram   Antimicrobials:none    Subjective: Feeling better.   Objective: Vitals:   01/10/17 2234 01/11/17 0651 01/11/17 1047 01/11/17 1423  BP: (!) 142/54 (!) 146/61 (!) 160/70 (!)  134/54  Pulse: 75 97 100 (!) 102  Resp: 18 18  20   Temp: 98 F (36.7 C) 98.6 F (37 C)  98.7 F (37.1 C)  TempSrc: Oral Oral  Oral  SpO2: 97% 100%  98%  Weight:      Height:        Intake/Output Summary (Last 24 hours) at 01/11/17 1505 Last data filed at 01/11/17 1426  Gross per 24 hour  Intake              240 ml  Output             2400 ml  Net            -2160 ml   Filed Weights   01/09/17 1917 01/10/17 0122  Weight: 102.5 kg (226 lb) 103.2 kg (227 lb 9.6 oz)    Examination:  General exam: Appears calm and comfortable  Respiratory system: Clear to auscultation. Respiratory effort normal. Cardiovascular system: S1 & S2 heard, RRR. No JVD, murmurs, rubs, gallops or clicks. No pedal edema. Gastrointestinal system: Abdomen is nondistended, soft and nontender. No organomegaly or masses felt. Normal bowel sounds heard. Central nervous system: Alert and oriented. No focal neurological deficits. Extremities: Symmetric 5 x 5 power. Skin: No rashes, lesions or ulcers Psychiatry: Judgement and insight appear normal. Mood & affect appropriate.     Data Reviewed: I have personally reviewed following labs and imaging studies  CBC:  Recent Labs Lab 01/09/17 2004  WBC 8.9  HGB  11.1*  HCT 34.3*  MCV 99.7  PLT 397   Basic Metabolic Panel:  Recent Labs Lab 01/09/17 2004 01/10/17 1247 01/11/17 0631  NA 139 138 139  K 5.1 4.2 4.5  CL 110 105 106  CO2 22 24 25   GLUCOSE 155* 133* 144*  BUN 21* 23* 26*  CREATININE 1.07 1.15 1.11  CALCIUM 8.6* 8.8* 8.9   GFR: Estimated Creatinine Clearance: 58.6 mL/min (by C-G formula based on SCr of 1.11 mg/dL). Liver Function Tests: No results for input(s): AST, ALT, ALKPHOS, BILITOT, PROT, ALBUMIN in the last 168 hours. No results for input(s): LIPASE, AMYLASE in the last 168 hours. No results for input(s): AMMONIA in the last 168 hours. Coagulation Profile: No results for input(s): INR, PROTIME in the last 168 hours. Cardiac  Enzymes:  Recent Labs Lab 01/09/17 2003 01/10/17 0155 01/10/17 0722 01/10/17 1247  TROPONINI <0.03 <0.03 <0.03 <0.03   BNP (last 3 results) No results for input(s): PROBNP in the last 8760 hours. HbA1C: No results for input(s): HGBA1C in the last 72 hours. CBG:  Recent Labs Lab 01/10/17 0802 01/10/17 1120  GLUCAP 113* 152*   Lipid Profile: No results for input(s): CHOL, HDL, LDLCALC, TRIG, CHOLHDL, LDLDIRECT in the last 72 hours. Thyroid Function Tests:  Recent Labs  01/10/17 0155  TSH 4.678*   Anemia Panel: No results for input(s): VITAMINB12, FOLATE, FERRITIN, TIBC, IRON, RETICCTPCT in the last 72 hours. Sepsis Labs: No results for input(s): PROCALCITON, LATICACIDVEN in the last 168 hours.  No results found for this or any previous visit (from the past 240 hour(s)).       Radiology Studies: Dg Chest 2 View  Result Date: 01/09/2017 CLINICAL DATA:  Acute onset of shortness of breath. Initial encounter. EXAM: CHEST  2 VIEW COMPARISON:  Chest radiograph performed 11/07/2012 FINDINGS: The lungs are well-aerated. Vascular congestion is noted. Increased interstitial markings raise concern for mild pulmonary edema. Small bilateral pleural effusions are noted. There is no evidence of pneumothorax. The heart is borderline normal in size. No acute osseous abnormalities are seen. IMPRESSION: Vascular congestion. Increased interstitial markings raise concern for mild pulmonary edema. Electronically Signed   By: Garald Balding M.D.   On: 01/09/2017 19:51   Ct Angio Chest Pe W Or Wo Contrast  Result Date: 01/09/2017 CLINICAL DATA:  Shortness of breath. Recent diagnosis of atrial fibrillation. EXAM: CT ANGIOGRAPHY CHEST WITH CONTRAST TECHNIQUE: Multidetector CT imaging of the chest was performed using the standard protocol during bolus administration of intravenous contrast. Multiplanar CT image reconstructions and MIPs were obtained to evaluate the vascular anatomy. CONTRAST:  100  cc Isovue 370 IV COMPARISON:  Chest radiograph earlier this day. Remote chest CTA 11/14/2003 FINDINGS: Cardiovascular: There are no filling defects within the pulmonary arteries to suggest pulmonary embolus. Prominent pulmonary artery measuring 3.9 cm. Mild atherosclerosis of the thoracic aorta without acute dissection. No aneurysm. Mild multi chamber cardiomegaly. Coronary artery calcifications. Aortic valvular calcifications. Mediastinum/Nodes: Small mediastinal lymph nodes, largest in the lower paratracheal region measured 10 mm. No hilar adenopathy. No pericardial fluid. Lungs/Pleura: Probable chain suture at the right costophrenic angle and postsurgical change in the lateral right mid lung. Small bilateral pleural effusions. Mild smooth septal thickening consistent with pulmonary edema. There is central bronchial thickening. Scattered perivascular ground-glass opacities. Nodular perifissural opacity in the right middle lobe measures 11 x 6 mm. A few tiny subpleural nodules are seen for example right upper lobe image 19 series 6, this nodule stable from prior. Left upper lobe  image 27. Left upper lobe image 44. Left lung nodules are new. No dominant pulmonary mass. Upper Abdomen: No acute abnormality. Postcholecystectomy. Tiny accessory arteries arise from the upper abdominal aorta. Musculoskeletal: Probable vertebral body hemangioma within T8, unchanged. There is degenerative change in the spine. Review of the MIP images confirms the above findings. IMPRESSION: 1. No pulmonary embolus. 2. Mild multi chamber cardiomegaly and pulmonary edema. Small pleural effusions. Findings may reflect mild CHF. 3. Postsurgical change in the right hemithorax. 4. Tiny subpleural nodules and perifissural thickening in the right lung, suspect pulmonary lymph nodes, however are nonspecific. No follow-up needed if patient is low-risk (and has no known or suspected primary neoplasm). Non-contrast chest CT can be considered in 12  months if patient is high-risk. Electronically Signed   By: Jeb Levering M.D.   On: 01/09/2017 22:58        Scheduled Meds: . amitriptyline  75 mg Oral QHS  . aspirin EC  81 mg Oral QHS  . azelastine  1 spray Each Nare QHS  . enoxaparin (LOVENOX) injection  40 mg Subcutaneous Q24H  . fluticasone  1 spray Each Nare QHS  . furosemide  40 mg Intravenous Daily  . levothyroxine  150 mcg Oral QAC breakfast  . loratadine  10 mg Oral Daily  . losartan  50 mg Oral Daily  . metoprolol succinate  50 mg Oral BID  . omega-3 acid ethyl esters  1 g Oral BID  . pantoprazole  40 mg Oral Daily  . vitamin C  1,000 mg Oral BID   Continuous Infusions:   LOS: 1 day    Time spent: 30 minutes.     Hosie Poisson, MD [facility] Pager [phone]   If 7PM-7AM, please contact night-coverage [email or web address] Password [email or web address] 01/11/2017, 3:05 PM

## 2017-01-12 ENCOUNTER — Telehealth: Payer: Self-pay

## 2017-01-12 DIAGNOSIS — I5031 Acute diastolic (congestive) heart failure: Secondary | ICD-10-CM

## 2017-01-12 DIAGNOSIS — I481 Persistent atrial fibrillation: Secondary | ICD-10-CM

## 2017-01-12 DIAGNOSIS — I4891 Unspecified atrial fibrillation: Secondary | ICD-10-CM

## 2017-01-12 LAB — BASIC METABOLIC PANEL
ANION GAP: 9 (ref 5–15)
BUN: 30 mg/dL — AB (ref 6–20)
CHLORIDE: 105 mmol/L (ref 101–111)
CO2: 25 mmol/L (ref 22–32)
Calcium: 8.9 mg/dL (ref 8.9–10.3)
Creatinine, Ser: 1.14 mg/dL (ref 0.61–1.24)
GFR calc Af Amer: >60 mL/min (ref >60)
GFR calc non Af Amer: 57 mL/min — ABNORMAL LOW (ref >60)
GLUCOSE: 161 mg/dL — AB (ref 65–99)
POTASSIUM: 4.3 mmol/L (ref 3.5–5.1)
Sodium: 139 mmol/L (ref 135–145)

## 2017-01-12 MED ORDER — DILTIAZEM HCL ER COATED BEADS 240 MG PO CP24: 240.0000 mg | ORAL_CAPSULE | Freq: Every day | ORAL | 0 refills | Status: DC

## 2017-01-12 MED ORDER — LORATADINE 10 MG PO TABS: 10.0000 mg | ORAL_TABLET | Freq: Every day | ORAL | Status: DC

## 2017-01-12 MED ORDER — DILTIAZEM HCL ER COATED BEADS 240 MG PO CP24
240.0000 mg | ORAL_CAPSULE | Freq: Every day | ORAL | Status: DC
  Administered 2017-01-12: 240 mg via ORAL
  Filled 2017-01-12: qty 1

## 2017-01-12 MED ORDER — METOPROLOL TARTRATE 75 MG PO TABS: 75.0000 mg | ORAL_TABLET | Freq: Two times a day (BID) | ORAL | 0 refills | Status: DC

## 2017-01-12 MED ORDER — METOPROLOL TARTRATE 25 MG PO TABS
25.0000 mg | ORAL_TABLET | Freq: Once | ORAL | Status: AC
  Administered 2017-01-12: 25 mg via ORAL
  Filled 2017-01-12: qty 1

## 2017-01-12 MED ORDER — METOPROLOL TARTRATE 50 MG PO TABS: 75.0000 mg | ORAL_TABLET | Freq: Two times a day (BID) | ORAL | Status: DC

## 2017-01-12 MED ORDER — FUROSEMIDE 20 MG PO TABS: 20.0000 mg | ORAL_TABLET | Freq: Every day | ORAL | 0 refills | Status: DC

## 2017-01-12 NOTE — Consult Note (Signed)
CARDIOLOGY CONSULT NOTE       Patient ID: [name] MRN: [id number] DOB/AGE: 81-04-1929 81 y.o.  Admit date: 01/09/2017 Referring Physician: Karleen Hampshire Primary Physician: Wende Neighbors, MD Primary Cardiologist: Gwenlyn Found Reason for Consultation: Rapid Atrial fibrillation and Diastolic CHF  Principal Problem:   Afib [facility]) Active Problems:   Obesity   SOB (shortness of breath)   Acute diastolic CHF (congestive heart failure) ([place])   Atrial fibrillation with RVR ([place])   HPI:  83 y.o. asked to evaluate/consult on for rapid afib and diastolic dysfunction by Dr Karleen Hampshire. Seen by Dr Gwenlyn Found October 2017 For ? PVD , LE edema and recently diagnosed afib. His ABI's and LE duplex showed no evidence of PVD. Event monitor showed afib. He did not anticoagulate patient due to history of GI bleed and 6-7 mechanical falls the past year Placed on Toprol for rate control Admitted over weekend with dyspnea and volume overload ? Diastolic CHF. Afib rates have been high. Given iv lasix and changed to lopressor 75 bid for rate control Feels better Repeat echo unchanged and personally reivewed with normal EF 60-65% midl AS and diatolic parameters not valid in Afib. He and wife had wanted to see Dr Rayann Heman for 2nd opinion I told them that he would have to at least be On short term anticoagulation to consider conversion and that I thought it would be best to see Doristine Devoid in afib clinic and f/u with  JB and not JA directly as he is not likely in need of ablation   ROS All other systems reviewed and negative except as noted above  Past Medical History:  Diagnosis Date  . Allergic rhinitis   . Asthmatic bronchitis   . Atrial fibrillation ([place])   . Celiac disease   . Cellulitis   . Diverticulosis   . DM (diabetes mellitus) ([place])   . Fx. left wrist 1987  . Gastric polyps   . GERD (gastroesophageal reflux disease)   . History of pneumonia    w/pleural effusion  . Hypothyroidism   . Iron deficiency anemia   .  Melanoma ([place])   . Neuropathy ([place])   . Prostate cancer ([place])   . REM sleep behavior disorder   . RLS (restless legs syndrome)   . Sleep apnea     Family History  Problem Relation Age of Onset  . Breast cancer Mother   . Kidney failure Father   . Heart disease Father   . Rheumatic fever Father   . Colon cancer      Social History   Social History  . Marital status: Married    Spouse name: N/A  . Number of children: N/A  . Years of education: N/A   Occupational History  . Retired     Theme park manager  .  [place] History Main Topics  . Smoking status: Never Smoker  . Smokeless tobacco: Never Used  . Alcohol use No  . Drug use: No  . Sexual activity: No   Other Topics Concern  . 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 APPENDECTOMY    . CARPAL TUNNEL RELEASE    . CATARACT EXTRACTION  03/2011   bilateral   . CHOLECYSTECTOMY  2001  . COLONOSCOPY W/ BIOPSIES  04/27/2008   diverticulosis, duodenitis  . FOOT SURGERY    .  HERNIA REPAIR  1994   bilateral  . KNEE SURGERY     x 2  . LUNG SURGERY  2001  . PROSTATECTOMY  2002  . UPPER GASTROINTESTINAL ENDOSCOPY  04/27/2008   celiac disease, gastric polyps     . amitriptyline  75 mg Oral QHS  . aspirin EC  81 mg Oral QHS  . azelastine  1 spray Each Nare QHS  . enoxaparin (LOVENOX) injection  40 mg Subcutaneous Q24H  . fluticasone  1 spray Each Nare QHS  . furosemide  40 mg Intravenous Daily  . levothyroxine  150 mcg Oral QAC breakfast  . loratadine  10 mg Oral Daily  . losartan  50 mg Oral Daily  . metoprolol tartrate  75 mg Oral BID  . omega-3 acid ethyl esters  1 g Oral BID  . pantoprazole  40 mg Oral Daily  . vitamin C  1,000 mg Oral BID     Physical Exam: Blood pressure (!) 148/80, pulse (!) 104, temperature 98.2 F (36.8 C), temperature source Oral, resp. rate 20, height 5'  10" (1.778 m), weight 227 lb 9.6 oz (103.2 kg), SpO2 97 %.   Affect appropriate Obese white male  HEENT: normal Neck supple with no adenopathy JVP normal no bruits no thyromegaly Lungs clear with no wheezing and good diaphragmatic motion Heart:  S1/S2 mild AS murmur, no rub, gallop or click PMI normal Abdomen: benighn, BS positve, no tenderness, no AAA no bruit.  No HSM or HJR Distal pulses intact with no bruits Plus 2 bilateral LE edema with stasis  Neuro non-focal Skin warm and dry No muscular weakness   Labs:   Lab Results  Component Value Date   WBC 8.9 01/09/2017   HGB 11.1 (L) 01/09/2017   HCT 34.3 (L) 01/09/2017   MCV 99.7 01/09/2017   PLT 392 01/09/2017     Recent Labs Lab 01/12/17 0559  NA 139  K 4.3  CL 105  CO2 25  BUN 30*  CREATININE 1.14  CALCIUM 8.9  GLUCOSE 161*   Lab Results  Component Value Date   TROPONINI <0.03 01/10/2017    Lab Results  Component Value Date   CHOL  02/28/2009    154        ATP III CLASSIFICATION:  <200     mg/dL   Desirable  200-239  mg/dL   Borderline High  >=240    mg/dL   High          Lab Results  Component Value Date   HDL 23 (L) 02/28/2009   Lab Results  Component Value Date   LDLCALC  02/28/2009    72        Total Cholesterol/HDL:CHD Risk Coronary Heart Disease Risk Table                     Men   Women  1/2 Average Risk   3.4   3.3  Average Risk       5.0   4.4  2 X Average Risk   9.6   7.1  3 X Average Risk  23.4   11.0        Use the calculated Patient Ratio above and the CHD Risk Table to determine the patient's CHD Risk.        ATP III CLASSIFICATION (LDL):  <100     mg/dL   Optimal  100-129  mg/dL   Near or Above  Optimal  130-159  mg/dL   Borderline  160-189  mg/dL   High  >190     mg/dL   Very High   Lab Results  Component Value Date   TRIG 296 (H) 02/28/2009   Lab Results  Component Value Date   CHOLHDL 6.7 02/28/2009   No results found for: LDLDIRECT      Radiology: Dg Chest 2 View  Result Date: 01/09/2017 CLINICAL DATA:  Acute onset of shortness of breath. Initial encounter. EXAM: CHEST  2 VIEW COMPARISON:  Chest radiograph performed 11/07/2012 FINDINGS: The lungs are well-aerated. Vascular congestion is noted. Increased interstitial markings raise concern for mild pulmonary edema. Small bilateral pleural effusions are noted. There is no evidence of pneumothorax. The heart is borderline normal in size. No acute osseous abnormalities are seen. IMPRESSION: Vascular congestion. Increased interstitial markings raise concern for mild pulmonary edema. Electronically Signed   By: Garald Balding M.D.   On: 01/09/2017 19:51   Ct Angio Chest Pe W Or Wo Contrast  Result Date: 01/09/2017 CLINICAL DATA:  Shortness of breath. Recent diagnosis of atrial fibrillation. EXAM: CT ANGIOGRAPHY CHEST WITH CONTRAST TECHNIQUE: Multidetector CT imaging of the chest was performed using the standard protocol during bolus administration of intravenous contrast. Multiplanar CT image reconstructions and MIPs were obtained to evaluate the vascular anatomy. CONTRAST:  100 cc Isovue 370 IV COMPARISON:  Chest radiograph earlier this day. Remote chest CTA 11/14/2003 FINDINGS: Cardiovascular: There are no filling defects within the pulmonary arteries to suggest pulmonary embolus. Prominent pulmonary artery measuring 3.9 cm. Mild atherosclerosis of the thoracic aorta without acute dissection. No aneurysm. Mild multi chamber cardiomegaly. Coronary artery calcifications. Aortic valvular calcifications. Mediastinum/Nodes: Small mediastinal lymph nodes, largest in the lower paratracheal region measured 10 mm. No hilar adenopathy. No pericardial fluid. Lungs/Pleura: Probable chain suture at the right costophrenic angle and postsurgical change in the lateral right mid lung. Small bilateral pleural effusions. Mild smooth septal thickening consistent with pulmonary edema. There is central bronchial  thickening. Scattered perivascular ground-glass opacities. Nodular perifissural opacity in the right middle lobe measures 11 x 6 mm. A few tiny subpleural nodules are seen for example right upper lobe image 19 series 6, this nodule stable from prior. Left upper lobe image 27. Left upper lobe image 44. Left lung nodules are new. No dominant pulmonary mass. Upper Abdomen: No acute abnormality. Postcholecystectomy. Tiny accessory arteries arise from the upper abdominal aorta. Musculoskeletal: Probable vertebral body hemangioma within T8, unchanged. There is degenerative change in the spine. Review of the MIP images confirms the above findings. IMPRESSION: 1. No pulmonary embolus. 2. Mild multi chamber cardiomegaly and pulmonary edema. Small pleural effusions. Findings may reflect mild CHF. 3. Postsurgical change in the right hemithorax. 4. Tiny subpleural nodules and perifissural thickening in the right lung, suspect pulmonary lymph nodes, however are nonspecific. No follow-up needed if patient is low-risk (and has no known or suspected primary neoplasm). Non-contrast chest CT can be considered in 12 months if patient is high-risk. Electronically Signed   By: Jeb Levering M.D.   On: 01/09/2017 22:58    EKG: Afib RBBB rate 108    ASSESSMENT AND PLAN:  Afib:  Tend to agree with high risk of anticoagulation. With normal EF should do ok with rate control. D/c home with lopressor 75 bid and add cardizem 240 CD  Will arrange outpatient f/u with Dr Gwenlyn Found and Doristine Devoid afib clinic  Diastolic CHF:  Echo unchanged d/c with lasix 20 mg given LE edema  K has been ok in hospital with iv diuresis   AS:  Mild observe no need for intervention   Thyroid:  On replacement TSH 4.7   Signed: Jenkins Rouge 01/12/2017, 3:08 PM

## 2017-01-12 NOTE — Care Management Note (Signed)
Case Management Note  Patient Details  Name: OSCEOLA DEPAZ MRN: [id number] Date of Birth: October 09, 1929  Subjective/Objective:     Adm with Afib. From home with wife, ind with ADL's. No DME or HH PTA. Works as Geophysicist/field seismologist for [employer]. Has PCP and reports no issues affording medications.                Action/Plan: Anticipate DC home with self care.   Expected Discharge Date:       01/12/2017           Expected Discharge Plan:  Home/Self Care  In-House Referral:     Discharge planning Services  CM Consult  Post Acute Care Choice:    Choice offered to:  NA  DME Arranged:    DME Agency:     HH Arranged:    HH Agency:     Status of Service:  Completed, signed off  If discussed at [employer] of Stay Meetings, dates discussed:    Additional Comments:  Barbette Mcglaun, Chauncey Reading, RN 01/12/2017, 2:48 PM

## 2017-01-12 NOTE — Telephone Encounter (Signed)
Asked by Dr Johnsie Cancel to make ref to Afib clinic in [place] and f/u apt with Dr Gwenlyn Found   Ref's placed

## 2017-01-12 NOTE — Progress Notes (Signed)
Pt discharged home today per Dr. Karleen Hampshire.  Pt's IV site D/C'd and WDL.  Pt's VSS.  Pt provided with home medication list, discharge instructions and prescriptions.  Verbalized understanding.  Pt left floor via WC in stable condition accompanied by NT.

## 2017-01-12 NOTE — Telephone Encounter (Signed)
Patient is currently admitted to hospital. Please call for follow-up appointment once discharged if needed [phone]

## 2017-01-12 NOTE — Discharge Summary (Signed)
Physician Discharge Summary  DARREK LEASURE [name]:[id number] DOB: 1929/04/03 DOA: 01/09/2017  PCP: Wende Neighbors, MD  Admit date: 01/09/2017 Discharge date: 01/12/2017  Admitted From: Home.  Disposition:  Home.   Recommendations for Outpatient Follow-up:  1. Follow up with PCP in 1-2 weeks 2. Please obtain BMP/CBC in one week 3. Please follow up with cardiology as recommended.   Discharge Condition:stable.  CODE STATUS: full code.  Diet recommendation: Heart Healthy   Brief/Interim Summary: [name] an 83 y.o.malewith hx of afib, recently diagnosed, on ASA only has he has fall risk, normal EF and mild AS, followed by Dr Gwenlyn Found, hx of DM, hypothryodism, asthmatic bronchitis, non smoker, presented to the ER with DOE for the past 4-5 days, and ankle swelling. Evaluation in the ER showed CXR with some vascular congestion, CTA with no PE or infiltrates,Hospitalist was asked to admit him for Afib with RVR, atypical chest tightness, and acute diastolic CHF  Discharge Diagnoses:  Principal Problem:   Afib ([place]) Active Problems:   Obesity   SOB (shortness of breath)   Acute diastolic CHF (congestive heart failure) ([facility])   Atrial fibrillation with RVR ([facility])  Atrial fibrillation:  Rate slightly up today, on metoprolol 50 mg bid. Currently on aspirin. ? Not on anticoagulation. Increased metoprolol to 75 mg BID . Cardiology consulted , re commended to add cardizem 240 mg daily on discharge.   Acute on chronic diastolic heart failure:  Admitted to telemetry, IV lasix, 40 mg BID.  I/O last 3 completed shifts: In: 791.7 [P.O.:720; I.V.:71.7] Out: 2900 [Urine:2900] Total I/O In: -  Out: 1450 [Urine:1450]  MORE THAN 3 liters of diuresis since admission.   Creatinine stable around 1.1. Will discharge him on 20 mg daily. Cardiology consulted and recommendations given.    Sob: improved. Off oxygen. Stable on RA.     Hypertension; well controlled.    Hypothyroidism:   Resume synthroid.  tsh slightly elevated. Follow up TSH in 4 weeks.     Discharge Instructions  Discharge Instructions    (HEART FAILURE PATIENTS) Call MD:  Anytime you have any of the following symptoms: 1) 3 pound weight gain in 24 hours or 5 pounds in 1 week 2) shortness of breath, with or without a dry hacking cough 3) swelling in the hands, feet or stomach 4) if you have to sleep on extra pillows at night in order to breathe.    Complete by:  As directed    Diet - low sodium heart healthy    Complete by:  As directed    Discharge instructions    Complete by:  As directed    Follow up with cardiology as recommended.     Allergies as of 01/12/2017      Reactions   Clindamycin Diarrhea   Hydrocodone    Penicillins    Has patient had a PCN reaction causing immediate rash, facial/tongue/throat swelling, SOB or lightheadedness with hypotension: No Has patient had a PCN reaction causing severe rash involving mucus membranes or skin necrosis: No Has patient had a PCN reaction that required hospitalization No Has patient had a PCN reaction occurring within the last 10 years: No If all of the above answers are "NO", then may proceed with Cephalosporin use.   Procaine Other (See Comments)   "Passes out"   Procaine Hcl    Sulfonamide Derivatives    Cephalexin Nausea And Vomiting   Dry hives, "violent" diarhea   Mold Extract [trichophyton] Other (See Comments)  Stuffiness, post nasal drip      Medication List    STOP taking these medications   meloxicam 15 MG tablet Commonly known as:  MOBIC   metoprolol succinate 50 MG 24 hr tablet Commonly known as:  TOPROL-XL     TAKE these medications   Alpha-Lipoic Acid 300 MG Caps Take 1 capsule by mouth daily.   amitriptyline 75 MG tablet Commonly known as:  ELAVIL Take 75 mg by mouth at bedtime.   aspirin 81 MG tablet Take 81 mg by mouth at bedtime.   azelastine 0.1 % nasal spray Commonly known as:  ASTELIN Place 1 spray  into both nostrils at bedtime.   CoQ10 200 MG Caps Take 1 capsule by mouth daily.   diltiazem 240 MG 24 hr capsule Commonly known as:  CARDIZEM CD Take 1 capsule (240 mg total) by mouth daily. Start taking on:  01/13/2017   Fish Oil 1000 MG Caps Take 1 capsule by mouth daily.   fluticasone 50 MCG/ACT nasal spray Commonly known as:  FLONASE Place 1 spray into both nostrils at bedtime.   furosemide 20 MG tablet Commonly known as:  LASIX Take 1 tablet (20 mg total) by mouth daily.   levocetirizine 5 MG tablet Commonly known as:  XYZAL Take 5 mg by mouth at bedtime.   levothyroxine 150 MCG tablet Commonly known as:  SYNTHROID, LEVOTHROID Take 150 mcg by mouth daily before breakfast.   loratadine 10 MG tablet Commonly known as:  CLARITIN Take 1 tablet (10 mg total) by mouth daily. Start taking on:  01/13/2017   losartan 100 MG tablet Commonly known as:  COZAAR Take 50 mg by mouth daily.   magnesium gluconate 500 MG tablet Commonly known as:  MAGONATE Take 500 mg by mouth daily.   Melatonin 3 MG Tabs Take 1 tablet by mouth at bedtime.   Metoprolol Tartrate 75 MG Tabs Take 75 mg by mouth 2 (two) times daily.   multivitamin tablet Take 1 tablet by mouth daily.   pantoprazole 40 MG tablet Commonly known as:  PROTONIX Take 40 mg by mouth daily.   RED YEAST RICE PO Take 1 tablet by mouth daily.   senna 8.6 MG tablet Commonly known as:  SENOKOT Take 1 tablet by mouth daily as needed for constipation.   vitamin C 1000 MG tablet Take 1,000 mg by mouth 2 (two) times daily.   Vitamin D3 1000 units Caps Take 1 tablet by mouth 3 (three) times daily.       Allergies  Allergen Reactions  . Clindamycin Diarrhea  . Hydrocodone   . Penicillins     Has patient had a PCN reaction causing immediate rash, facial/tongue/throat swelling, SOB or lightheadedness with hypotension: No Has patient had a PCN reaction causing severe rash involving mucus membranes or skin  necrosis: No Has patient had a PCN reaction that required hospitalization No Has patient had a PCN reaction occurring within the last 10 years: No If all of the above answers are "NO", then may proceed with Cephalosporin use.   . Procaine Other (See Comments)    "Passes out"  . Procaine Hcl   . Sulfonamide Derivatives   . Cephalexin Nausea And Vomiting    Dry hives, "violent" diarhea  . Mold Extract [Trichophyton] Other (See Comments)    Stuffiness, post nasal drip    Consultations:  Cardiology.   Procedures/Studies: Dg Chest 2 View  Result Date: 01/09/2017 CLINICAL DATA:  Acute onset of shortness of breath. Initial  encounter. EXAM: CHEST  2 VIEW COMPARISON:  Chest radiograph performed 11/07/2012 FINDINGS: The lungs are well-aerated. Vascular congestion is noted. Increased interstitial markings raise concern for mild pulmonary edema. Small bilateral pleural effusions are noted. There is no evidence of pneumothorax. The heart is borderline normal in size. No acute osseous abnormalities are seen. IMPRESSION: Vascular congestion. Increased interstitial markings raise concern for mild pulmonary edema. Electronically Signed   By: Garald Balding M.D.   On: 01/09/2017 19:51   Ct Angio Chest Pe W Or Wo Contrast  Result Date: 01/09/2017 CLINICAL DATA:  Shortness of breath. Recent diagnosis of atrial fibrillation. EXAM: CT ANGIOGRAPHY CHEST WITH CONTRAST TECHNIQUE: Multidetector CT imaging of the chest was performed using the standard protocol during bolus administration of intravenous contrast. Multiplanar CT image reconstructions and MIPs were obtained to evaluate the vascular anatomy. CONTRAST:  100 cc Isovue 370 IV COMPARISON:  Chest radiograph earlier this day. Remote chest CTA 11/14/2003 FINDINGS: Cardiovascular: There are no filling defects within the pulmonary arteries to suggest pulmonary embolus. Prominent pulmonary artery measuring 3.9 cm. Mild atherosclerosis of the thoracic aorta  without acute dissection. No aneurysm. Mild multi chamber cardiomegaly. Coronary artery calcifications. Aortic valvular calcifications. Mediastinum/Nodes: Small mediastinal lymph nodes, largest in the lower paratracheal region measured 10 mm. No hilar adenopathy. No pericardial fluid. Lungs/Pleura: Probable chain suture at the right costophrenic angle and postsurgical change in the lateral right mid lung. Small bilateral pleural effusions. Mild smooth septal thickening consistent with pulmonary edema. There is central bronchial thickening. Scattered perivascular ground-glass opacities. Nodular perifissural opacity in the right middle lobe measures 11 x 6 mm. A few tiny subpleural nodules are seen for example right upper lobe image 19 series 6, this nodule stable from prior. Left upper lobe image 27. Left upper lobe image 44. Left lung nodules are new. No dominant pulmonary mass. Upper Abdomen: No acute abnormality. Postcholecystectomy. Tiny accessory arteries arise from the upper abdominal aorta. Musculoskeletal: Probable vertebral body hemangioma within T8, unchanged. There is degenerative change in the spine. Review of the MIP images confirms the above findings. IMPRESSION: 1. No pulmonary embolus. 2. Mild multi chamber cardiomegaly and pulmonary edema. Small pleural effusions. Findings may reflect mild CHF. 3. Postsurgical change in the right hemithorax. 4. Tiny subpleural nodules and perifissural thickening in the right lung, suspect pulmonary lymph nodes, however are nonspecific. No follow-up needed if patient is low-risk (and has no known or suspected primary neoplasm). Non-contrast chest CT can be considered in 12 months if patient is high-risk. Electronically Signed   By: Jeb Levering M.D.   On: 01/09/2017 22:58       Subjective:  No new complaints.  Discharge Exam: Vitals:   01/12/17 1002 01/12/17 1247  BP:    Pulse: (!) 105 (!) 104  Resp:    Temp:     Vitals:   01/11/17 1557 01/12/17  0635 01/12/17 1002 01/12/17 1247  BP: (!) 164/70 (!) 148/80    Pulse: 95 84 (!) 105 (!) 104  Resp: 20 20    Temp: 98.1 F (36.7 C) 98.2 F (36.8 C)    TempSrc: Oral Oral    SpO2: 91% 97%    Weight:      Height:        General: Pt is alert, awake, not in acute distress Cardiovascular: RRR, S1/S2 +, no rubs, no gallops Respiratory: CTA bilaterally, no wheezing, no rhonchi Abdominal: Soft, NT, ND, bowel sounds + Extremities: no edema, no cyanosis    The results of  significant diagnostics from this hospitalization (including imaging, microbiology, ancillary and laboratory) are listed below for reference.     Microbiology: No results found for this or any previous visit (from the past 240 hour(s)).   Labs: BNP (last 3 results)  Recent Labs  01/09/17 2003  BNP 155.2*   Basic Metabolic Panel:  Recent Labs Lab 01/09/17 2004 01/10/17 1247 01/11/17 0631 01/12/17 0559  NA 139 138 139 139  K 5.1 4.2 4.5 4.3  CL 110 105 106 105  CO2 22 24 25 25   GLUCOSE 155* 133* 144* 161*  BUN 21* 23* 26* 30*  CREATININE 1.07 1.15 1.11 1.14  CALCIUM 8.6* 8.8* 8.9 8.9   Liver Function Tests: No results for input(s): AST, ALT, ALKPHOS, BILITOT, PROT, ALBUMIN in the last 168 hours. No results for input(s): LIPASE, AMYLASE in the last 168 hours. No results for input(s): AMMONIA in the last 168 hours. CBC:  Recent Labs Lab 01/09/17 2004  WBC 8.9  HGB 11.1*  HCT 34.3*  MCV 99.7  PLT 392   Cardiac Enzymes:  Recent Labs Lab 01/09/17 2003 01/10/17 0155 01/10/17 0722 01/10/17 1247  TROPONINI <0.03 <0.03 <0.03 <0.03   BNP: Invalid input(s): POCBNP CBG:  Recent Labs Lab 01/10/17 0802 01/10/17 1120  GLUCAP 113* 152*   D-Dimer No results for input(s): DDIMER in the last 72 hours. Hgb A1c No results for input(s): HGBA1C in the last 72 hours. Lipid Profile No results for input(s): CHOL, HDL, LDLCALC, TRIG, CHOLHDL, LDLDIRECT in the last 72 hours. Thyroid function  studies  Recent Labs  01/10/17 0155  TSH 4.678*   Anemia work up No results for input(s): VITAMINB12, FOLATE, FERRITIN, TIBC, IRON, RETICCTPCT in the last 72 hours. Urinalysis No results found for: COLORURINE, APPEARANCEUR, LABSPEC, [place], GLUCOSEU, HGBUR, BILIRUBINUR, KETONESUR, PROTEINUR, UROBILINOGEN, NITRITE, LEUKOCYTESUR Sepsis Labs Invalid input(s): PROCALCITONIN,  WBC,  LACTICIDVEN Microbiology No results found for this or any previous visit (from the past 240 hour(s)).   Time coordinating discharge: Over 30 minutes  SIGNED:   Hosie Poisson, MD  [facility] 01/12/2017, 3:30 PM Pager   If 7PM-7AM, please contact night-coverage [email or web address] Password [email or web address]

## 2017-01-15 DIAGNOSIS — Z09 Encounter for follow-up examination after completed treatment for conditions other than malignant neoplasm: Secondary | ICD-10-CM | POA: Diagnosis not present

## 2017-01-15 DIAGNOSIS — Z79899 Other long term (current) drug therapy: Secondary | ICD-10-CM | POA: Diagnosis not present

## 2017-01-15 DIAGNOSIS — R031 Nonspecific low blood-pressure reading: Secondary | ICD-10-CM | POA: Diagnosis not present

## 2017-01-15 DIAGNOSIS — I4891 Unspecified atrial fibrillation: Secondary | ICD-10-CM | POA: Diagnosis not present

## 2017-01-15 DIAGNOSIS — Z6832 Body mass index (BMI) 32.0-32.9, adult: Secondary | ICD-10-CM | POA: Diagnosis not present

## 2017-01-15 DIAGNOSIS — I5031 Acute diastolic (congestive) heart failure: Secondary | ICD-10-CM | POA: Diagnosis not present

## 2017-01-15 DIAGNOSIS — I480 Paroxysmal atrial fibrillation: Secondary | ICD-10-CM | POA: Diagnosis not present

## 2017-01-15 DIAGNOSIS — D509 Iron deficiency anemia, unspecified: Secondary | ICD-10-CM | POA: Diagnosis not present

## 2017-01-15 NOTE — Telephone Encounter (Signed)
Pt scheduled with [facility] Clinic on 01/16/17 and with Dr. Gwenlyn Found on 02/13/17. FYI. Thanks!

## 2017-01-16 ENCOUNTER — Ambulatory Visit (HOSPITAL_COMMUNITY)
Admission: RE | Admit: 2017-01-16 | Discharge: 2017-01-16 | Disposition: A | Discharge status: Home / Self Care | Payer: PPO | Source: Ambulatory Visit | Attending: Nurse Practitioner | Admitting: Nurse Practitioner

## 2017-01-16 ENCOUNTER — Encounter (HOSPITAL_COMMUNITY): Payer: Self-pay | Admitting: Nurse Practitioner

## 2017-01-16 VITALS — BP 116/50 | HR 53 | Ht 70.0 in | Wt 227.6 lb

## 2017-01-16 DIAGNOSIS — Z880 Allergy status to penicillin: Secondary | ICD-10-CM | POA: Diagnosis not present

## 2017-01-16 DIAGNOSIS — Z7982 Long term (current) use of aspirin: Secondary | ICD-10-CM | POA: Diagnosis not present

## 2017-01-16 DIAGNOSIS — G4730 Sleep apnea, unspecified: Secondary | ICD-10-CM | POA: Diagnosis not present

## 2017-01-16 DIAGNOSIS — K317 Polyp of stomach and duodenum: Secondary | ICD-10-CM | POA: Diagnosis not present

## 2017-01-16 DIAGNOSIS — K900 Celiac disease: Secondary | ICD-10-CM | POA: Diagnosis not present

## 2017-01-16 DIAGNOSIS — K5790 Diverticulosis of intestine, part unspecified, without perforation or abscess without bleeding: Secondary | ICD-10-CM | POA: Insufficient documentation

## 2017-01-16 DIAGNOSIS — Z85820 Personal history of malignant melanoma of skin: Secondary | ICD-10-CM | POA: Diagnosis not present

## 2017-01-16 DIAGNOSIS — J45909 Unspecified asthma, uncomplicated: Secondary | ICD-10-CM | POA: Diagnosis not present

## 2017-01-16 DIAGNOSIS — G2581 Restless legs syndrome: Secondary | ICD-10-CM | POA: Diagnosis not present

## 2017-01-16 DIAGNOSIS — Z882 Allergy status to sulfonamides status: Secondary | ICD-10-CM | POA: Insufficient documentation

## 2017-01-16 DIAGNOSIS — I5031 Acute diastolic (congestive) heart failure: Secondary | ICD-10-CM | POA: Diagnosis not present

## 2017-01-16 DIAGNOSIS — Z8546 Personal history of malignant neoplasm of prostate: Secondary | ICD-10-CM | POA: Diagnosis not present

## 2017-01-16 DIAGNOSIS — Z9181 History of falling: Secondary | ICD-10-CM | POA: Insufficient documentation

## 2017-01-16 DIAGNOSIS — I4510 Unspecified right bundle-branch block: Secondary | ICD-10-CM | POA: Insufficient documentation

## 2017-01-16 DIAGNOSIS — E039 Hypothyroidism, unspecified: Secondary | ICD-10-CM | POA: Insufficient documentation

## 2017-01-16 DIAGNOSIS — I4819 Other persistent atrial fibrillation: Secondary | ICD-10-CM

## 2017-01-16 DIAGNOSIS — I481 Persistent atrial fibrillation: Secondary | ICD-10-CM | POA: Diagnosis not present

## 2017-01-16 DIAGNOSIS — E119 Type 2 diabetes mellitus without complications: Secondary | ICD-10-CM | POA: Diagnosis not present

## 2017-01-16 DIAGNOSIS — K219 Gastro-esophageal reflux disease without esophagitis: Secondary | ICD-10-CM | POA: Diagnosis not present

## 2017-01-16 MED ORDER — FUROSEMIDE 20 MG PO TABS: 20.0000 mg | ORAL_TABLET | ORAL | 0 refills | Status: DC

## 2017-01-16 MED ORDER — METOPROLOL TARTRATE 50 MG PO TABS: 50.0000 mg | ORAL_TABLET | Freq: Two times a day (BID) | ORAL | Status: DC

## 2017-01-16 NOTE — Progress Notes (Signed)
Primary Care Physician: Wende Neighbors, MD Referring Physician: [facility] f/u Cardiologist: Dr. Rober Minion [name] is a 83 y.o. male with a h/o  afib, recently diagnosed, on ASA only has he has fall risk, normal EF and mild AS, followed by Dr Gwenlyn Found, hx of DM, hypothryodism, asthmatic bronchitis, non smoker, presented to the ER with DOE for the past 4-5 days, and ankle swelling. Evaluation in the ER showed CXR with some vascular congestion, CTA with no PE or infiltrates,Hospitalist was asked to admit him for Afib with RVR, atypical chest tightness, and acute diastolic CHF.  He was diuresed more than 3 liters since admission. Cardizem 240 mg a day was added, metoprolol was increased and daily lasix was added.Meloxicam was stopped.  In the afib clinic for f/u and he feels improved. He has not been weighting himself daily and he was encouraged to do so. He saw his PCP yesterday with repeat labs and pt was c/o being thirsty. He was instructed to take lasix every other day. He is on asa only due to unsteady gait and h/o of falls. He has also has a GI bleed a couple of years ago, using NSAIDS at the time. He has known some friends that have nearly died from blood thinners or close call. He is not interested at all in taking anticoagulation other than baby asa. He is c/o of some low BP's at home and HR is in the low 50's today. Discussed the fact without anticoagulation on board, I can not pursue restoring NR and he is ok with that.  Today, he denies symptoms of palpitations, chest pain,  orthopnea, PND, lower extremity edema, dizziness, presyncope, syncope, or neurologic sequela. Improved exertional dyspnea, positive for thirst. Positive for unsteady gait. The patient is tolerating medications without difficulties and is otherwise without complaint today.   Past Medical History:  Diagnosis Date  . Allergic rhinitis   . Asthmatic bronchitis   . Atrial fibrillation ([place])   . Celiac disease   . Cellulitis     . Diverticulosis   . DM (diabetes mellitus) ([place])   . Fx. left wrist 1987  . Gastric polyps   . GERD (gastroesophageal reflux disease)   . History of pneumonia    w/pleural effusion  . Hypothyroidism   . Iron deficiency anemia   . Melanoma ([place])   . Neuropathy ([place])   . Prostate cancer ([place])   . REM sleep behavior disorder   . RLS (restless legs syndrome)   . Sleep apnea    Past Surgical History:  Procedure Laterality Date  . APPENDECTOMY    . CARPAL TUNNEL RELEASE    . CATARACT EXTRACTION  03/2011   bilateral   . CHOLECYSTECTOMY  2001  . COLONOSCOPY W/ BIOPSIES  04/27/2008   diverticulosis, duodenitis  . FOOT SURGERY    . HERNIA REPAIR  1994   bilateral  . KNEE SURGERY     x 2  . LUNG SURGERY  2001  . PROSTATECTOMY  2002  . UPPER GASTROINTESTINAL ENDOSCOPY  04/27/2008   celiac disease, gastric polyps    Current Outpatient Prescriptions  Medication Sig Dispense Refill  . Alpha-Lipoic Acid 300 MG CAPS Take 1 capsule by mouth daily.      Marland Kitchen amitriptyline (ELAVIL) 75 MG tablet Take 75 mg by mouth at bedtime.     . Ascorbic Acid (VITAMIN C) 1000 MG tablet Take 1,000 mg by mouth 2 (two) times daily.      Marland Kitchen aspirin  81 MG tablet Take 81 mg by mouth at bedtime.     Marland Kitchen azelastine (ASTELIN) 0.1 % nasal spray Place 1 spray into both nostrils at bedtime.     . Cholecalciferol (VITAMIN D3) 1000 UNITS CAPS Take 1 tablet by mouth 3 (three) times daily.     . Coenzyme Q10 (COQ10) 200 MG CAPS Take 1 capsule by mouth daily.      Marland Kitchen diltiazem (CARDIZEM CD) 240 MG 24 hr capsule Take 1 capsule (240 mg total) by mouth daily. 30 capsule 0  . fluticasone (FLONASE) 50 MCG/ACT nasal spray Place 1 spray into both nostrils at bedtime.     . furosemide (LASIX) 20 MG tablet Take 1 tablet (20 mg total) by mouth every other day. 30 tablet 0  . levocetirizine (XYZAL) 5 MG tablet Take 5 mg by mouth at bedtime.     Marland Kitchen levothyroxine (SYNTHROID, LEVOTHROID) 150 MCG tablet Take 150 mcg by mouth daily before  breakfast.    . losartan (COZAAR) 100 MG tablet Take 50 mg by mouth daily.     . magnesium gluconate (MAGONATE) 500 MG tablet Take 500 mg by mouth daily.      . Melatonin 3 MG TABS Take 1 tablet by mouth at bedtime.     . Multiple Vitamin (MULTIVITAMIN) tablet Take 1 tablet by mouth daily.      . Omega-3 Fatty Acids (FISH OIL) 1000 MG CAPS Take 1 capsule by mouth daily.    . pantoprazole (PROTONIX) 40 MG tablet Take 40 mg by mouth daily.    . Red Yeast Rice Extract (RED YEAST RICE PO) Take 1 tablet by mouth daily.    Marland Kitchen senna (SENOKOT) 8.6 MG tablet Take 1 tablet by mouth daily as needed for constipation.     . metoprolol (LOPRESSOR) 50 MG tablet Take 1 tablet (50 mg total) by mouth 2 (two) times daily.     No current facility-administered medications for this encounter.     Allergies  Allergen Reactions  . Clindamycin Diarrhea  . Hydrocodone   . Penicillins     Has patient had a PCN reaction causing immediate rash, facial/tongue/throat swelling, SOB or lightheadedness with hypotension: No Has patient had a PCN reaction causing severe rash involving mucus membranes or skin necrosis: No Has patient had a PCN reaction that required hospitalization No Has patient had a PCN reaction occurring within the last 10 years: No If all of the above answers are "NO", then may proceed with Cephalosporin use.   . Procaine Other (See Comments)    "Passes out"  . Procaine Hcl   . Sulfonamide Derivatives   . Cephalexin Nausea And Vomiting    Dry hives, "violent" diarhea  . Mold Extract [Trichophyton] Other (See Comments)    Stuffiness, post nasal drip    Social History   Social History  . Marital status: Married    Spouse name: N/A  . Number of children: N/A  . Years of education: N/A   Occupational History  . Retired     Theme park manager  .  [place] History Main Topics  . Smoking status: Never Smoker  . Smokeless tobacco: Never Used  . Alcohol use No  . Drug use: No  .  Sexual activity: No   Other Topics Concern  . 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  Breast cancer Mother   . Kidney failure Father   . Heart disease Father   . Rheumatic fever Father   . Colon cancer      ROS- All systems are reviewed and negative except as per the HPI above  Physical Exam: Vitals:   01/16/17 1049  BP: (!) 116/50  Pulse: (!) 53  Weight: 227 lb 9.6 oz (103.2 kg)  Height: 5\' 10"  (1.778 m)   Wt Readings from Last 3 Encounters:  01/16/17 227 lb 9.6 oz (103.2 kg)  01/10/17 227 lb 9.6 oz (103.2 kg)  09/17/16 227 lb 6.4 oz (103.1 kg)    Labs: Lab Results  Component Value Date   NA 139 01/12/2017   K 4.3 01/12/2017   CL 105 01/12/2017   CO2 25 01/12/2017   GLUCOSE 161 (H) 01/12/2017   BUN 30 (H) 01/12/2017   CREATININE 1.14 01/12/2017   CALCIUM 8.9 01/12/2017   No results found for: INR Lab Results  Component Value Date   CHOL  02/28/2009    154        ATP III CLASSIFICATION:  <200     mg/dL   Desirable  200-239  mg/dL   Borderline High  >=240    mg/dL   High          HDL 23 (L) 02/28/2009   LDLCALC  02/28/2009    72        Total Cholesterol/HDL:CHD Risk Coronary Heart Disease Risk Table                     Men   Women  1/2 Average Risk   3.4   3.3  Average Risk       5.0   4.4  2 X Average Risk   9.6   7.1  3 X Average Risk  23.4   11.0        Use the calculated Patient Ratio above and the CHD Risk Table to determine the patient's CHD Risk.        ATP III CLASSIFICATION (LDL):  <100     mg/dL   Optimal  100-129  mg/dL   Near or Above                    Optimal  130-159  mg/dL   Borderline  160-189  mg/dL   High  >190     mg/dL   Very High   TRIG 296 (H) 02/28/2009     GEN- The patient is well appearing, alert and oriented x 3 today.   Head- normocephalic, atraumatic Eyes-  Sclera clear, conjunctiva  pink Ears- hearing intact Oropharynx- clear Neck- supple, no JVP Lymph- no cervical lymphadenopathy Lungs- Clear to ausculation bilaterally, normal work of breathing Heart- irregular rate and rhythm, no murmurs, rubs or gallops, PMI not laterally displaced GI- soft, NT, ND, + BS Extremities- no clubbing, cyanosis, or edema MS- no significant deformity or atrophy Skin- no rash or lesion Psych- euthymic mood, full affect Neuro- strength and sensation are intact  EKG-afib with slow ventricular rate at 53 bpm Epic records reviewed     Assessment and Plan: 1.Persistent afib Currently pt has slow v response Decrease metoprolol back to 50 mg bid Continue cardizem at 240 mg qd Not on anticoagulation due to fall risk and dislike of anticoagulation, prior GI bleed Risk of stroke discussed  2. Acute diastolic HF Asked pt to weigh daily Weight stable today Has been told by  pcp to decrease lasix to 20 mg qod for thirst issues, soft BP  Avoid salt If becomes more short of breath with increase in pedal edema/weight, he is to call for adjustment of diuretic  f/u in afib clinic in one week    Butch Penny C. Saphira Lahmann, [facility] [address] [place], [place] [address] [phone]

## 2017-01-16 NOTE — Patient Instructions (Signed)
Your physician has recommended you make the following change in your medication:  1)Decrease metoprolol back to 50mg  twice a day  AVOID salty foods and weigh yourself every day - first thing in morning and record daily

## 2017-01-19 ENCOUNTER — Telehealth (HOSPITAL_COMMUNITY): Payer: Self-pay | Admitting: *Deleted

## 2017-01-19 MED ORDER — FUROSEMIDE 20 MG PO TABS: 20.0000 mg | ORAL_TABLET | Freq: Every day | ORAL | 0 refills | Status: DC

## 2017-01-19 NOTE — Telephone Encounter (Signed)
Pt cld reporting up 6 lbs since visit last week.  Pt was instructed per Roderic Palau, NP to take and extra 20 mg of lasix today and then resume 20 mg every day and we will recheck kidney function with upcoming appointment.  Pt understood

## 2017-01-22 ENCOUNTER — Ambulatory Visit (HOSPITAL_COMMUNITY)
Admission: RE | Admit: 2017-01-22 | Discharge: 2017-01-22 | Disposition: A | Discharge status: Home / Self Care | Payer: PPO | Source: Ambulatory Visit | Attending: Nurse Practitioner | Admitting: Nurse Practitioner

## 2017-01-22 ENCOUNTER — Encounter (HOSPITAL_COMMUNITY): Payer: Self-pay | Admitting: Nurse Practitioner

## 2017-01-22 VITALS — BP 138/62 | HR 61 | Ht 70.0 in | Wt 229.0 lb

## 2017-01-22 DIAGNOSIS — E039 Hypothyroidism, unspecified: Secondary | ICD-10-CM | POA: Insufficient documentation

## 2017-01-22 DIAGNOSIS — Z7982 Long term (current) use of aspirin: Secondary | ICD-10-CM | POA: Diagnosis not present

## 2017-01-22 DIAGNOSIS — G4730 Sleep apnea, unspecified: Secondary | ICD-10-CM | POA: Insufficient documentation

## 2017-01-22 DIAGNOSIS — J45909 Unspecified asthma, uncomplicated: Secondary | ICD-10-CM | POA: Insufficient documentation

## 2017-01-22 DIAGNOSIS — I5031 Acute diastolic (congestive) heart failure: Secondary | ICD-10-CM | POA: Diagnosis not present

## 2017-01-22 DIAGNOSIS — Z85820 Personal history of malignant melanoma of skin: Secondary | ICD-10-CM | POA: Diagnosis not present

## 2017-01-22 DIAGNOSIS — D509 Iron deficiency anemia, unspecified: Secondary | ICD-10-CM | POA: Diagnosis not present

## 2017-01-22 DIAGNOSIS — G2581 Restless legs syndrome: Secondary | ICD-10-CM | POA: Diagnosis not present

## 2017-01-22 DIAGNOSIS — I4510 Unspecified right bundle-branch block: Secondary | ICD-10-CM | POA: Insufficient documentation

## 2017-01-22 DIAGNOSIS — Z8546 Personal history of malignant neoplasm of prostate: Secondary | ICD-10-CM | POA: Diagnosis not present

## 2017-01-22 DIAGNOSIS — Z882 Allergy status to sulfonamides status: Secondary | ICD-10-CM | POA: Diagnosis not present

## 2017-01-22 DIAGNOSIS — K219 Gastro-esophageal reflux disease without esophagitis: Secondary | ICD-10-CM | POA: Insufficient documentation

## 2017-01-22 DIAGNOSIS — H353131 Nonexudative age-related macular degeneration, bilateral, early dry stage: Secondary | ICD-10-CM | POA: Diagnosis not present

## 2017-01-22 DIAGNOSIS — E119 Type 2 diabetes mellitus without complications: Secondary | ICD-10-CM | POA: Diagnosis not present

## 2017-01-22 DIAGNOSIS — Z961 Presence of intraocular lens: Secondary | ICD-10-CM | POA: Diagnosis not present

## 2017-01-22 DIAGNOSIS — K317 Polyp of stomach and duodenum: Secondary | ICD-10-CM | POA: Insufficient documentation

## 2017-01-22 DIAGNOSIS — Z9181 History of falling: Secondary | ICD-10-CM | POA: Insufficient documentation

## 2017-01-22 DIAGNOSIS — K900 Celiac disease: Secondary | ICD-10-CM | POA: Diagnosis not present

## 2017-01-22 DIAGNOSIS — I481 Persistent atrial fibrillation: Secondary | ICD-10-CM | POA: Diagnosis not present

## 2017-01-22 DIAGNOSIS — E1140 Type 2 diabetes mellitus with diabetic neuropathy, unspecified: Secondary | ICD-10-CM | POA: Diagnosis not present

## 2017-01-22 DIAGNOSIS — Z880 Allergy status to penicillin: Secondary | ICD-10-CM | POA: Insufficient documentation

## 2017-01-22 DIAGNOSIS — H35363 Drusen (degenerative) of macula, bilateral: Secondary | ICD-10-CM | POA: Diagnosis not present

## 2017-01-22 DIAGNOSIS — I4819 Other persistent atrial fibrillation: Secondary | ICD-10-CM

## 2017-01-22 DIAGNOSIS — H524 Presbyopia: Secondary | ICD-10-CM | POA: Diagnosis not present

## 2017-01-22 LAB — BASIC METABOLIC PANEL
Anion gap: 7 (ref 5–15)
BUN: 20 mg/dL (ref 6–20)
CHLORIDE: 109 mmol/L (ref 101–111)
CO2: 23 mmol/L (ref 22–32)
CREATININE: 1.24 mg/dL (ref 0.61–1.24)
Calcium: 9 mg/dL (ref 8.9–10.3)
GFR calc Af Amer: 59 mL/min — ABNORMAL LOW (ref >60)
GFR calc non Af Amer: 51 mL/min — ABNORMAL LOW (ref >60)
Glucose, Bld: 117 mg/dL — ABNORMAL HIGH (ref 65–99)
POTASSIUM: 4.9 mmol/L (ref 3.5–5.1)
SODIUM: 139 mmol/L (ref 135–145)

## 2017-01-22 NOTE — Progress Notes (Signed)
Primary Care Physician: Wende Neighbors, MD Referring Physician: [facility] f/u Cardiologist: Dr. Rober Minion [name] is a 83 y.o. male with a h/o  afib, recently diagnosed, on ASA only has he has fall risk, normal EF and mild AS, followed by Dr Gwenlyn Found, hx of DM, hypothryodism, asthmatic bronchitis, non smoker, presented to the ER with DOE for the past 4-5 days, and ankle swelling. Evaluation in the ER showed CXR with some vascular congestion, CTA with no PE or infiltrates,Hospitalist was asked to admit him for Afib with RVR, atypical chest tightness, and acute diastolic CHF.  He was diuresed more than 3 liters since admission. Cardizem 240 mg a day was added, metoprolol was increased and daily lasix was added.Meloxicam was stopped.  In the afib clinic for f/u and he feels improved. He has not been weighting himself daily and he was encouraged to do so. He saw his PCP yesterday with repeat labs and pt was c/o being thirsty. He was instructed to take lasix every other day. He is on asa only due to unsteady gait and h/o of falls. He has also has a GI bleed a couple of years ago, using NSAIDS at the time. He has known some friends that have nearly died from blood thinners or close call. He is not interested at all in taking anticoagulation other than baby asa. He is c/o of some low BP's at home and HR is in the low 50's today. Discussed the fact without anticoagulation on board, I can not pursue restoring NR and he is ok with that.  F/u in afib clinic today 4/12. His HR is higher now  At 61 instead of low 50's with decrease in metoprolol. He was taking lasix 20 mg qod but called the office early in the week and had gained 6 lbs. He was advised to take an extra 20 mg that day and go back to daily lasix. He had a fall over the week end with an abrasion on left leg. No LOC.  Today, he denies symptoms of palpitations, chest pain,  orthopnea, PND, lower extremity edema, dizziness, presyncope, syncope, or neurologic  sequela. Improved exertional dyspnea, positive for thirst. Positive for unsteady gait. The patient is tolerating medications without difficulties and is otherwise without complaint today.   Past Medical History:  Diagnosis Date  . Allergic rhinitis   . Asthmatic bronchitis   . Atrial fibrillation ([place])   . Celiac disease   . Cellulitis   . Diverticulosis   . DM (diabetes mellitus) ([place])   . Fx. left wrist 1987  . Gastric polyps   . GERD (gastroesophageal reflux disease)   . History of pneumonia    w/pleural effusion  . Hypothyroidism   . Iron deficiency anemia   . Melanoma ([place])   . Neuropathy ([place])   . Prostate cancer ([place])   . REM sleep behavior disorder   . RLS (restless legs syndrome)   . Sleep apnea    Past Surgical History:  Procedure Laterality Date  . APPENDECTOMY    . CARPAL TUNNEL RELEASE    . CATARACT EXTRACTION  03/2011   bilateral   . CHOLECYSTECTOMY  2001  . COLONOSCOPY W/ BIOPSIES  04/27/2008   diverticulosis, duodenitis  . FOOT SURGERY    . HERNIA REPAIR  1994   bilateral  . KNEE SURGERY     x 2  . LUNG SURGERY  2001  . PROSTATECTOMY  2002  . UPPER GASTROINTESTINAL ENDOSCOPY  04/27/2008  celiac disease, gastric polyps    Current Outpatient Prescriptions  Medication Sig Dispense Refill  . Alpha-Lipoic Acid 300 MG CAPS Take 1 capsule by mouth daily.      Marland Kitchen amitriptyline (ELAVIL) 75 MG tablet Take 75 mg by mouth at bedtime.     . Ascorbic Acid (VITAMIN C) 1000 MG tablet Take 1,000 mg by mouth 2 (two) times daily.      Marland Kitchen aspirin 81 MG tablet Take 81 mg by mouth at bedtime.     Marland Kitchen azelastine (ASTELIN) 0.1 % nasal spray Place 1 spray into both nostrils at bedtime.     . Cholecalciferol (VITAMIN D3) 1000 UNITS CAPS Take 1 tablet by mouth 3 (three) times daily.     . Coenzyme Q10 (COQ10) 200 MG CAPS Take 1 capsule by mouth daily.      Marland Kitchen diltiazem (CARDIZEM CD) 240 MG 24 hr capsule Take 1 capsule (240 mg total) by mouth daily. 30 capsule 0  . fluticasone  (FLONASE) 50 MCG/ACT nasal spray Place 1 spray into both nostrils at bedtime.     . furosemide (LASIX) 20 MG tablet Take 1 tablet (20 mg total) by mouth daily. 30 tablet 0  . levocetirizine (XYZAL) 5 MG tablet Take 5 mg by mouth at bedtime.     Marland Kitchen levothyroxine (SYNTHROID, LEVOTHROID) 150 MCG tablet Take 150 mcg by mouth daily before breakfast.    . losartan (COZAAR) 100 MG tablet Take 50 mg by mouth daily.     . magnesium gluconate (MAGONATE) 500 MG tablet Take 500 mg by mouth daily.      . Melatonin 3 MG TABS Take 1 tablet by mouth at bedtime.     . metoprolol (LOPRESSOR) 50 MG tablet Take 1 tablet (50 mg total) by mouth 2 (two) times daily.    . Multiple Vitamin (MULTIVITAMIN) tablet Take 1 tablet by mouth daily.      . Omega-3 Fatty Acids (FISH OIL) 1000 MG CAPS Take 1 capsule by mouth daily.    . pantoprazole (PROTONIX) 40 MG tablet Take 40 mg by mouth daily.    . Red Yeast Rice Extract (RED YEAST RICE PO) Take 1 tablet by mouth daily.    Marland Kitchen senna (SENOKOT) 8.6 MG tablet Take 1 tablet by mouth daily as needed for constipation.      No current facility-administered medications for this encounter.     Allergies  Allergen Reactions  . Clindamycin Diarrhea  . Hydrocodone   . Penicillins     Has patient had a PCN reaction causing immediate rash, facial/tongue/throat swelling, SOB or lightheadedness with hypotension: No Has patient had a PCN reaction causing severe rash involving mucus membranes or skin necrosis: No Has patient had a PCN reaction that required hospitalization No Has patient had a PCN reaction occurring within the last 10 years: No If all of the above answers are "NO", then may proceed with Cephalosporin use.   . Procaine Other (See Comments)    "Passes out"  . Procaine Hcl   . Sulfonamide Derivatives   . Cephalexin Nausea And Vomiting    Dry hives, "violent" diarhea  . Mold Extract [Trichophyton] Other (See Comments)    Stuffiness, post nasal drip    Social History    Social History  . Marital status: Married    Spouse name: N/A  . Number of children: N/A  . Years of education: N/A   Occupational History  . Retired     Theme park manager  .  [place]  Social History Main Topics  . Smoking status: Never Smoker  . Smokeless tobacco: Never Used  . Alcohol use No  . Drug use: No  . Sexual activity: No   Other Topics Concern  . 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 Breast cancer Mother   . Kidney failure Father   . Heart disease Father   . Rheumatic fever Father   . Colon cancer      ROS- All systems are reviewed and negative except as per the HPI above  Physical Exam: Vitals:   01/22/17 1126  BP: 138/62  Pulse: 61  Weight: 229 lb (103.9 kg)  Height: 5\' 10"  (1.778 m)   Wt Readings from Last 3 Encounters:  01/22/17 229 lb (103.9 kg)  01/16/17 227 lb 9.6 oz (103.2 kg)  01/10/17 227 lb 9.6 oz (103.2 kg)    Labs: Lab Results  Component Value Date   NA 139 01/12/2017   K 4.3 01/12/2017   CL 105 01/12/2017   CO2 25 01/12/2017   GLUCOSE 161 (H) 01/12/2017   BUN 30 (H) 01/12/2017   CREATININE 1.14 01/12/2017   CALCIUM 8.9 01/12/2017   No results found for: INR Lab Results  Component Value Date   CHOL  02/28/2009    154        ATP III CLASSIFICATION:  <200     mg/dL   Desirable  200-239  mg/dL   Borderline High  >=240    mg/dL   High          HDL 23 (L) 02/28/2009   LDLCALC  02/28/2009    72        Total Cholesterol/HDL:CHD Risk Coronary Heart Disease Risk Table                     Men   Women  1/2 Average Risk   3.4   3.3  Average Risk       5.0   4.4  2 X Average Risk   9.6   7.1  3 X Average Risk  23.4   11.0        Use the calculated Patient Ratio above and the CHD Risk Table to determine the patient's CHD Risk.        ATP III CLASSIFICATION (LDL):  <100     mg/dL   Optimal  100-129   mg/dL   Near or Above                    Optimal  130-159  mg/dL   Borderline  160-189  mg/dL   High  >190     mg/dL   Very High   TRIG 296 (H) 02/28/2009     GEN- The patient is well appearing, alert and oriented x 3 today.   Head- normocephalic, atraumatic Eyes-  Sclera clear, conjunctiva pink Ears- hearing intact Oropharynx- clear Neck- supple, no JVP Lymph- no cervical lymphadenopathy Lungs- Clear to ausculation bilaterally, normal work of breathing Heart- irregular rate and rhythm, no murmurs, rubs or gallops, PMI not laterally displaced GI- soft, NT, ND, + BS Extremities- no clubbing, cyanosis, or edema MS- no significant deformity or atrophy Skin- no rash or lesion Psych- euthymic mood, full affect Neuro- strength and sensation are intact  EKG-afib with slow ventricular rate at 61 bpm,  qrs int 142 ms, qtc 434 ms Epic records reviewed    Assessment and Plan: 1.Persistent afib  Continue  metoprolol at 50 mg bid Continue cardizem at 240 mg qd Not on anticoagulation due to fall risk and dislike of anticoagulation, prior GI bleed, had another fall this week Risk of stroke discussed  2. Acute diastolic HF Asked pt to weigh daily Weight stable today Continue lasix at 20 mg daily Avoid salt bmet today  f/u in afib clinic as needed Dr. Gwenlyn Found 5/4   Geroge Baseman. Jaidon Ellery, [facility] [address] [place], [place] [address] [phone]

## 2017-01-23 ENCOUNTER — Ambulatory Visit (HOSPITAL_COMMUNITY): Payer: PPO | Admitting: Nurse Practitioner

## 2017-02-04 DIAGNOSIS — Z125 Encounter for screening for malignant neoplasm of prostate: Secondary | ICD-10-CM | POA: Diagnosis not present

## 2017-02-04 DIAGNOSIS — D509 Iron deficiency anemia, unspecified: Secondary | ICD-10-CM | POA: Diagnosis not present

## 2017-02-04 DIAGNOSIS — E1142 Type 2 diabetes mellitus with diabetic polyneuropathy: Secondary | ICD-10-CM | POA: Diagnosis not present

## 2017-02-04 DIAGNOSIS — E559 Vitamin D deficiency, unspecified: Secondary | ICD-10-CM | POA: Diagnosis not present

## 2017-02-04 DIAGNOSIS — E782 Mixed hyperlipidemia: Secondary | ICD-10-CM | POA: Diagnosis not present

## 2017-02-06 DIAGNOSIS — M25569 Pain in unspecified knee: Secondary | ICD-10-CM | POA: Diagnosis not present

## 2017-02-06 DIAGNOSIS — R944 Abnormal results of kidney function studies: Secondary | ICD-10-CM | POA: Diagnosis not present

## 2017-02-06 DIAGNOSIS — I482 Chronic atrial fibrillation: Secondary | ICD-10-CM | POA: Diagnosis not present

## 2017-02-06 DIAGNOSIS — E875 Hyperkalemia: Secondary | ICD-10-CM | POA: Diagnosis not present

## 2017-02-06 DIAGNOSIS — E1142 Type 2 diabetes mellitus with diabetic polyneuropathy: Secondary | ICD-10-CM | POA: Diagnosis not present

## 2017-02-06 DIAGNOSIS — R2689 Other abnormalities of gait and mobility: Secondary | ICD-10-CM | POA: Diagnosis not present

## 2017-02-06 DIAGNOSIS — Z8546 Personal history of malignant neoplasm of prostate: Secondary | ICD-10-CM | POA: Diagnosis not present

## 2017-02-06 DIAGNOSIS — D509 Iron deficiency anemia, unspecified: Secondary | ICD-10-CM | POA: Diagnosis not present

## 2017-02-06 DIAGNOSIS — E782 Mixed hyperlipidemia: Secondary | ICD-10-CM | POA: Diagnosis not present

## 2017-02-06 DIAGNOSIS — I10 Essential (primary) hypertension: Secondary | ICD-10-CM | POA: Diagnosis not present

## 2017-02-06 DIAGNOSIS — K900 Celiac disease: Secondary | ICD-10-CM | POA: Diagnosis not present

## 2017-02-06 DIAGNOSIS — I5031 Acute diastolic (congestive) heart failure: Secondary | ICD-10-CM | POA: Diagnosis not present

## 2017-02-10 DIAGNOSIS — I5031 Acute diastolic (congestive) heart failure: Secondary | ICD-10-CM | POA: Diagnosis not present

## 2017-02-10 DIAGNOSIS — M25569 Pain in unspecified knee: Secondary | ICD-10-CM | POA: Diagnosis not present

## 2017-02-10 DIAGNOSIS — E782 Mixed hyperlipidemia: Secondary | ICD-10-CM | POA: Diagnosis not present

## 2017-02-10 DIAGNOSIS — K900 Celiac disease: Secondary | ICD-10-CM | POA: Diagnosis not present

## 2017-02-10 DIAGNOSIS — E875 Hyperkalemia: Secondary | ICD-10-CM | POA: Diagnosis not present

## 2017-02-10 DIAGNOSIS — G629 Polyneuropathy, unspecified: Secondary | ICD-10-CM | POA: Diagnosis not present

## 2017-02-10 DIAGNOSIS — R269 Unspecified abnormalities of gait and mobility: Secondary | ICD-10-CM | POA: Diagnosis not present

## 2017-02-10 DIAGNOSIS — K5900 Constipation, unspecified: Secondary | ICD-10-CM | POA: Diagnosis not present

## 2017-02-13 ENCOUNTER — Encounter: Payer: Self-pay | Admitting: Cardiovascular Disease

## 2017-02-13 ENCOUNTER — Ambulatory Visit (INDEPENDENT_AMBULATORY_CARE_PROVIDER_SITE_OTHER): Payer: PPO | Admitting: Cardiovascular Disease

## 2017-02-13 VITALS — BP 118/50 | HR 66 | Ht 70.0 in | Wt 230.0 lb

## 2017-02-13 DIAGNOSIS — I480 Paroxysmal atrial fibrillation: Secondary | ICD-10-CM

## 2017-02-13 DIAGNOSIS — I739 Peripheral vascular disease, unspecified: Secondary | ICD-10-CM

## 2017-02-13 DIAGNOSIS — I5031 Acute diastolic (congestive) heart failure: Secondary | ICD-10-CM

## 2017-02-13 MED ORDER — FUROSEMIDE 20 MG PO TABS: 20.0000 mg | ORAL_TABLET | Freq: Two times a day (BID) | ORAL | 3 refills | Status: DC

## 2017-02-13 NOTE — Progress Notes (Signed)
02/13/2017 [name]   01-26-1929  [id number]  Primary Physician Calvin Neighbors, MD Primary Cardiologist: Calvin Harp MD [name]  HPI:  [name] is an 83 year old mild to moderately overweight married Caucasian male father of 58, grandfather and 6 grandchildren who is accompanied by his wife [name] today. I last saw him in the office 09/17/16. He was referred by Dr. Wende Morgan for peripheral vascular evaluation. He is a Engineering geologist. He lives in [name]. He has no cardiac risk factors. He does complain of lifestyle limiting bilateral lower extremity claudication. There also was a question of recently diagnosed atrial fibrillation. He has never had a heart attack or stroke. He denies chest pain but does get some some shortness of breath on exertion especially when his legs are bothering him. He did have lower extremity Doppler studies that revealed normal ABIs bilaterally. In addition, he had an event monitor that showed A. fib with RVR. I elected not to anticoagulate him because of fall risk. He was admitted to the hospital on 6/94/85 with diastolic heart failure and discharged 3 days later. He was diuresed 3 L. He was recently placed on Zaroxolyn by his PCP in the morning prior to his a.m. Lasix dose. He weighs himself on a daily basis. We reiterated the importance of salt restriction. Because of slightly increasing weight and lower extremity edema I'm going to increase his Lasix to twice a day.   Current Outpatient Prescriptions  Medication Sig Dispense Refill  . Alpha-Lipoic Acid 300 MG CAPS Take 1 capsule by mouth daily.      Marland Kitchen amitriptyline (ELAVIL) 75 MG tablet Take 75 mg by mouth at bedtime.     . Ascorbic Acid (VITAMIN C) 1000 MG tablet Take 1,000 mg by mouth 2 (two) times daily.      Marland Kitchen aspirin 81 MG tablet Take 81 mg by mouth at bedtime.     Marland Kitchen azelastine (ASTELIN) 0.1 % nasal spray Place 1 spray into both nostrils at bedtime.     .  Cholecalciferol (VITAMIN D3) 1000 UNITS CAPS Take 1 tablet by mouth 3 (three) times daily.     . Coenzyme Q10 (COQ10) 200 MG CAPS Take 1 capsule by mouth daily.      Marland Kitchen diltiazem (CARDIZEM CD) 240 MG 24 hr capsule Take 1 capsule (240 mg total) by mouth daily. 30 capsule 0  . fluticasone (FLONASE) 50 MCG/ACT nasal spray Place 1 spray into both nostrils at bedtime.     . furosemide (LASIX) 20 MG tablet Take 1 tablet (20 mg total) by mouth 2 (two) times daily. 60 tablet 3  . levocetirizine (XYZAL) 5 MG tablet Take 5 mg by mouth at bedtime.     Marland Kitchen levothyroxine (SYNTHROID, LEVOTHROID) 150 MCG tablet Take 150 mcg by mouth daily before breakfast.    . losartan (COZAAR) 100 MG tablet Take 50 mg by mouth daily.     . magnesium gluconate (MAGONATE) 500 MG tablet Take 500 mg by mouth daily.      . Melatonin 3 MG TABS Take 1 tablet by mouth at bedtime.     . metolazone (ZAROXOLYN) 2.5 MG tablet Take 1 tablet by mouth daily.    . metoprolol (LOPRESSOR) 50 MG tablet Take 1 tablet (50 mg total) by mouth 2 (two) times daily.    . Multiple Vitamin (MULTIVITAMIN) tablet Take 1 tablet by mouth daily.      . Omega-3 Fatty Acids (FISH OIL) 1000  MG CAPS Take 1 capsule by mouth daily.    . pantoprazole (PROTONIX) 40 MG tablet Take 40 mg by mouth daily.    . Red Yeast Rice Extract (RED YEAST RICE PO) Take 1 tablet by mouth daily.    Marland Kitchen senna (SENOKOT) 8.6 MG tablet Take 1 tablet by mouth daily as needed for constipation.      No current facility-administered medications for this visit.     Allergies  Allergen Reactions  . Clindamycin Diarrhea  . Hydrocodone   . Penicillins     Has patient had a PCN reaction causing immediate rash, facial/tongue/throat swelling, SOB or lightheadedness with hypotension: No Has patient had a PCN reaction causing severe rash involving mucus membranes or skin necrosis: No Has patient had a PCN reaction that required hospitalization No Has patient had a PCN reaction occurring within  the last 10 years: No If all of the above answers are "NO", then may proceed with Cephalosporin use.   . Procaine Other (See Comments)    "Passes out"  . Procaine Hcl   . Sulfonamide Derivatives   . Cephalexin Nausea And Vomiting    Dry hives, "violent" diarhea  . Mold Extract [Trichophyton] Other (See Comments)    Stuffiness, post nasal drip    Social History   Social History  . Marital status: Married    Spouse name: N/A  . Number of children: N/A  . Years of education: N/A   Occupational History  . Retired     Theme park manager  .  [place] History Main Topics  . Smoking status: Never Smoker  . Smokeless tobacco: Never Used  . Alcohol use No  . Drug use: No  . Sexual activity: No   Other Topics Concern  . 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  Cardiovascular: negative for chest pain, dyspnea on exertion, edema, orthopnea, palpitations, paroxysmal nocturnal dyspnea or shortness of breath Dermatological: negative for rash Respiratory: negative for cough or wheezing Urologic: negative for hematuria Abdominal: negative for nausea, vomiting, diarrhea, bright red blood per rectum, melena, or hematemesis Neurologic: negative for visual changes, syncope, or dizziness All other systems reviewed and are otherwise negative except as noted above.    Blood pressure (!) 118/50, pulse 66, height 5\' 10"  (1.778 m), weight 230 lb (104.3 kg).  General appearance: alert and no distress Neck: no adenopathy, no carotid bruit, no JVD, supple, symmetrical, trachea midline and thyroid not enlarged, symmetric, no tenderness/mass/nodules Lungs: clear to auscultation bilaterally Heart: irregularly irregular rhythm Extremities: 3+ left lower extremity pitting edema, 1-2+ right  EKG atrial fibrillation with a ventricular  response of 66 right bundle branch block. I personally reviewed this EKG  ASSESSMENT AND PLAN:   Claudication ([place]) Symptoms of claudication but normal Doppler studies.  Paroxysmal atrial fibrillation (HCC) History of persistent atrial fibrillation now rate controlled on a calcium channel blocker and beta blocker. He has not been oral anticoagulation candidate because of fall risk and prior GI bleed.  Acute diastolic CHF (congestive heart failure) [facility]) Mr. Crist was recently admitted to [facility] 01/09/17 for 3 days. He was diuresed 3 L. He is also being seen in the A. fib clinic by Roderic Palau. His rate is controlled. He checks his weight on a daily basis and is provided a log  of that. He does have 2-3+ left lower extremity pitting edema and 1-2+ right. Zaroxolyn was recently added by his PCP. I'm going to increase his Lasix from once a day to twice a day. We'll we'll check a basic metabolic panel in 2 weeks and he will see Almyra Deforest PAC back in one month. I will see him back in 3 months      Calvin Harp MD [facility], [facility] 02/13/2017 1:38 PM

## 2017-02-13 NOTE — Assessment & Plan Note (Signed)
[name] was recently admitted to [facility] 01/09/17 for 3 days. He was diuresed 3 L. He is also being seen in the A. fib clinic by [name]. His rate is controlled. He checks his weight on a daily basis and is provided a log of that. He does have 2-3+ left lower extremity pitting edema and 1-2+ right. Zaroxolyn was recently added by his PCP. I'm going to increase his Lasix from once a day to twice a day. We'll we'll check a basic metabolic panel in 2 weeks and he will see [name] PAC back in one month. I will see him back in 3 months

## 2017-02-13 NOTE — Assessment & Plan Note (Signed)
History of persistent atrial fibrillation now rate controlled on a calcium channel blocker and beta blocker. He has not been oral anticoagulation candidate because of fall risk and prior GI bleed.

## 2017-02-13 NOTE — Patient Instructions (Signed)
Medication Instructions: Increase Lasix to 20 mg twice daily.  Take Metolazone once daily---30 min prior to Lasix dose.   Labwork: Your physician recommends that you return for lab work: BMET--10 days-2 weeks   Follow-Up: We request that you follow-up in: 1 month with Almyra Deforest, PA and in 3 months with Dr Andria Rhein will receive a reminder letter in the mail two months in advance. If you don't receive a letter, please call our office to schedule the follow-up appointment.  If you need a refill on your cardiac medications before your next appointment, please call your pharmacy.

## 2017-02-13 NOTE — Assessment & Plan Note (Signed)
Symptoms of claudication but normal Doppler studies.

## 2017-02-24 DIAGNOSIS — I10 Essential (primary) hypertension: Secondary | ICD-10-CM | POA: Diagnosis not present

## 2017-02-24 DIAGNOSIS — I499 Cardiac arrhythmia, unspecified: Secondary | ICD-10-CM | POA: Diagnosis not present

## 2017-02-24 DIAGNOSIS — I5031 Acute diastolic (congestive) heart failure: Secondary | ICD-10-CM | POA: Diagnosis not present

## 2017-02-24 DIAGNOSIS — R2689 Other abnormalities of gait and mobility: Secondary | ICD-10-CM | POA: Diagnosis not present

## 2017-02-24 DIAGNOSIS — Z8546 Personal history of malignant neoplasm of prostate: Secondary | ICD-10-CM | POA: Diagnosis not present

## 2017-02-24 DIAGNOSIS — R944 Abnormal results of kidney function studies: Secondary | ICD-10-CM | POA: Diagnosis not present

## 2017-02-24 DIAGNOSIS — R5383 Other fatigue: Secondary | ICD-10-CM | POA: Diagnosis not present

## 2017-02-24 DIAGNOSIS — E782 Mixed hyperlipidemia: Secondary | ICD-10-CM | POA: Diagnosis not present

## 2017-02-24 DIAGNOSIS — K900 Celiac disease: Secondary | ICD-10-CM | POA: Diagnosis not present

## 2017-02-24 DIAGNOSIS — E875 Hyperkalemia: Secondary | ICD-10-CM | POA: Diagnosis not present

## 2017-02-24 DIAGNOSIS — Z6832 Body mass index (BMI) 32.0-32.9, adult: Secondary | ICD-10-CM | POA: Diagnosis not present

## 2017-02-25 DIAGNOSIS — I480 Paroxysmal atrial fibrillation: Secondary | ICD-10-CM | POA: Diagnosis not present

## 2017-02-26 LAB — BASIC METABOLIC PANEL WITH GFR
BUN: 37 mg/dL — AB (ref 7–25)
CALCIUM: 8.8 mg/dL (ref 8.6–10.3)
CO2: 21 mmol/L (ref 20–31)
Chloride: 104 mmol/L (ref 98–110)
Creat: 1.72 mg/dL — ABNORMAL HIGH (ref 0.70–1.11)
GFR, EST AFRICAN AMERICAN: 41 mL/min — AB (ref >=60)
GFR, Est Non African American: 35 mL/min — ABNORMAL LOW (ref >=60)
GLUCOSE: 239 mg/dL — AB (ref 65–99)
POTASSIUM: 4.9 mmol/L (ref 3.5–5.3)
Sodium: 137 mmol/L (ref 135–146)

## 2017-02-27 ENCOUNTER — Telehealth: Payer: Self-pay | Admitting: *Deleted

## 2017-02-27 DIAGNOSIS — Z79899 Other long term (current) drug therapy: Secondary | ICD-10-CM

## 2017-02-27 MED ORDER — FUROSEMIDE 20 MG PO TABS: 20.0000 mg | ORAL_TABLET | Freq: Every day | ORAL | 3 refills | Status: DC

## 2017-02-27 NOTE — Telephone Encounter (Signed)
Patient called with lab results.  Per Dr. Gwenlyn Found: BUN/creatinine had increased since it was last checked a month ago. Cut Lasix back to once a day and recheck in 2 weeks   Lab orders have been mailed  and Lasix reduced to once daily. Patient verbalized his understanding and will have his labs drawn in two weeks.

## 2017-03-12 DIAGNOSIS — Z79899 Other long term (current) drug therapy: Secondary | ICD-10-CM | POA: Diagnosis not present

## 2017-03-13 LAB — BASIC METABOLIC PANEL WITH GFR
BUN: 37 mg/dL — AB (ref 7–25)
CALCIUM: 8.7 mg/dL (ref 8.6–10.3)
CO2: 20 mmol/L (ref 20–31)
CREATININE: 1.54 mg/dL — AB (ref 0.70–1.11)
Chloride: 106 mmol/L (ref 98–110)
GFR, EST AFRICAN AMERICAN: 47 mL/min — AB (ref >=60)
GFR, EST NON AFRICAN AMERICAN: 41 mL/min — AB (ref >=60)
GLUCOSE: 173 mg/dL — AB (ref 65–99)
Potassium: 4.9 mmol/L (ref 3.5–5.3)
Sodium: 136 mmol/L (ref 135–146)

## 2017-03-16 NOTE — Addendum Note (Signed)
Addended by: Waylan Rocher on: 03/16/2017 09:40 AM   Modules accepted: Orders

## 2017-03-18 ENCOUNTER — Ambulatory Visit (INDEPENDENT_AMBULATORY_CARE_PROVIDER_SITE_OTHER): Payer: PPO | Admitting: Podiatry

## 2017-03-18 ENCOUNTER — Encounter: Payer: Self-pay | Admitting: Podiatry

## 2017-03-18 VITALS — BP 139/61 | HR 70 | Resp 18

## 2017-03-18 DIAGNOSIS — M79676 Pain in unspecified toe(s): Secondary | ICD-10-CM | POA: Diagnosis not present

## 2017-03-18 DIAGNOSIS — B351 Tinea unguium: Secondary | ICD-10-CM

## 2017-03-18 DIAGNOSIS — G609 Hereditary and idiopathic neuropathy, unspecified: Secondary | ICD-10-CM | POA: Diagnosis not present

## 2017-03-18 NOTE — Progress Notes (Signed)
Patient ID: [name], male   DOB: 1929/05/12, 83 y.o.   MRN: [id number]    Subjective: This patient presents today for scheduled visit complaining of painful toenails and walking wearing shoes and requests toenail debridement  Objective: Orientated 3 DP and PT pulses 2/4 bilaterally Capillary reflex immediate bilaterally Sensation to 10 g monofilament wire intact 0/5 bilaterally Vibratory sensation reactive bilaterally Ankle reflex equal and reactive bilaterally Manual motor testing dorsi flexion, plantar flexion 5/5 bilaterally No open skin lesions bilaterall Atrophic skin bilaterally Absent hair growth bilaterally The toenails are elongated, hypertrophic, brittle, discolored and tender to direct palpation 6-10  Assessment: Symptomatic onychomycoses 6-10 Peripheral neuropathy idiopathic  Plan: Debrided toenails 10 mechanically and electrically with slight bleeding distal left hallux, treated with topical antibiotic ointment and a Band-Aid. Continue to apply topical antibiotic ointment and Band-Aid daily until a scab forms  Reappoint 3 months

## 2017-03-18 NOTE — Patient Instructions (Signed)
Remove the Band-Aid on the left great toe 1-3 days and continue to apply topical antibiotic ointment and a Band-Aid daily until a scab forms

## 2017-03-23 DIAGNOSIS — E119 Type 2 diabetes mellitus without complications: Secondary | ICD-10-CM | POA: Diagnosis not present

## 2017-04-02 ENCOUNTER — Encounter: Payer: Self-pay | Admitting: Physician Assistant

## 2017-04-02 ENCOUNTER — Ambulatory Visit (INDEPENDENT_AMBULATORY_CARE_PROVIDER_SITE_OTHER): Payer: PPO | Admitting: Physician Assistant

## 2017-04-02 VITALS — BP 112/57 | HR 51 | Ht 70.0 in | Wt 229.0 lb

## 2017-04-02 DIAGNOSIS — Z79899 Other long term (current) drug therapy: Secondary | ICD-10-CM

## 2017-04-02 DIAGNOSIS — E119 Type 2 diabetes mellitus without complications: Secondary | ICD-10-CM | POA: Diagnosis not present

## 2017-04-02 DIAGNOSIS — I5032 Chronic diastolic (congestive) heart failure: Secondary | ICD-10-CM | POA: Diagnosis not present

## 2017-04-02 DIAGNOSIS — E039 Hypothyroidism, unspecified: Secondary | ICD-10-CM

## 2017-04-02 DIAGNOSIS — I482 Chronic atrial fibrillation, unspecified: Secondary | ICD-10-CM

## 2017-04-02 DIAGNOSIS — G4733 Obstructive sleep apnea (adult) (pediatric): Secondary | ICD-10-CM

## 2017-04-02 MED ORDER — METOLAZONE 2.5 MG PO TABS: 2.5000 mg | ORAL_TABLET | Freq: Every day | ORAL | 2 refills | Status: DC

## 2017-04-02 NOTE — Progress Notes (Signed)
Cardiology Office Note    Date:  04/02/2017   ID:  Titus, Drone 08/30/1929, MRN [id number]  PCP:  Celene Squibb, MD  Cardiologist:  Dr. Gwenlyn Found  Chief Complaint  Patient presents with  . Follow-up    1 month; seen for Dr. Gwenlyn Found    History of Present Illness:  [name] is a 83 y.o. male with PMH of DM, Hypothyroidism, RLS, OSA, PAD and chronic atrial fibrillation. She has been followed by Dr. Gwenlyn Found for PVD evaluation. He did have lower extremity Doppler arterial Doppler which revealed normal ABIs bilaterally. Patient he had an event monitor that showed atrial fibrillation with RVR. He was not anticoagulated due to fall risk. Otherwise he never had any heart issue. He was admitted to the hospital in March with diastolic heart failure and discharged later after diuresing 3 L. He was started on Zaroxolyn by his primary care provider prior to his a.m. Lasix dose. Because of increasing weight and lower extremity edema, he was placed on Lasix twice a day by Dr. Gwenlyn Found on 02/13/2017. Lasix was later cut back to once daily after renal function worsened, repeat basic metabolic panel obtained on 03/12/2017 showed renal function has improved.  He presents today for cardiology office visit. He is currently on 20 mg Lasix with daily metolazone. He seems to be tolerating the current dosage. I will recheck a basic metabolic panel. Otherwise he is still in atrial fibrillation based on physical exam. He has 1-2+ pitting edema in the left lower extremity, he has trace amount of edema in the right lower extremity. Right lower extremity has more woody appearance. He is wearing compression stocking which is helping to control some of his swelling. I will continue on the current dose of Lasix and metolazone. However if repeat metabolic panel shows worsening renal function, I would have low threshold to discontinue metolazone and attempt at uptitrating Lasix dose instead.   Past Medical History:  Diagnosis  Date  . Allergic rhinitis   . Asthmatic bronchitis   . Atrial fibrillation ([place])   . Celiac disease   . Cellulitis   . Diverticulosis   . DM (diabetes mellitus) ([place])   . Fx. left wrist 1987  . Gastric polyps   . GERD (gastroesophageal reflux disease)   . History of pneumonia    w/pleural effusion  . Hypothyroidism   . Iron deficiency anemia   . Melanoma ([place])   . Neuropathy   . Prostate cancer ([place])   . REM sleep behavior disorder   . RLS (restless legs syndrome)   . Sleep apnea     Past Surgical History:  Procedure Laterality Date  . APPENDECTOMY    . CARPAL TUNNEL RELEASE    . CATARACT EXTRACTION  03/2011   bilateral   . CHOLECYSTECTOMY  2001  . COLONOSCOPY W/ BIOPSIES  04/27/2008   diverticulosis, duodenitis  . FOOT SURGERY    . HERNIA REPAIR  1994   bilateral  . KNEE SURGERY     x 2  . LUNG SURGERY  2001  . PROSTATECTOMY  2002  . UPPER GASTROINTESTINAL ENDOSCOPY  04/27/2008   celiac disease, gastric polyps    Current Medications: Outpatient Medications Prior to Visit  Medication Sig Dispense Refill  . Alpha-Lipoic Acid 300 MG CAPS Take 1 capsule by mouth daily.      Marland Kitchen amitriptyline (ELAVIL) 75 MG tablet Take 75 mg by mouth at bedtime.     . Ascorbic Acid (VITAMIN C)  1000 MG tablet Take 1,000 mg by mouth 2 (two) times daily.      Marland Kitchen aspirin 81 MG tablet Take 81 mg by mouth at bedtime.     Marland Kitchen azelastine (ASTELIN) 0.1 % nasal spray Place 1 spray into both nostrils at bedtime.     . Cholecalciferol (VITAMIN D3) 1000 UNITS CAPS Take 1 tablet by mouth 3 (three) times daily.     . Coenzyme Q10 (COQ10) 200 MG CAPS Take 1 capsule by mouth daily.      Marland Kitchen diltiazem (CARDIZEM CD) 240 MG 24 hr capsule Take 1 capsule (240 mg total) by mouth daily. 30 capsule 0  . fluticasone (FLONASE) 50 MCG/ACT nasal spray Place 1 spray into both nostrils at bedtime.     . furosemide (LASIX) 20 MG tablet Take 1 tablet (20 mg total) by mouth daily. 60 tablet 3  . levocetirizine (XYZAL) 5 MG  tablet Take 5 mg by mouth at bedtime.     Marland Kitchen levothyroxine (SYNTHROID, LEVOTHROID) 150 MCG tablet Take 150 mcg by mouth daily before breakfast.    . losartan (COZAAR) 100 MG tablet Take 50 mg by mouth daily.     . magnesium gluconate (MAGONATE) 500 MG tablet Take 500 mg by mouth daily.      . Melatonin 3 MG TABS Take 1 tablet by mouth at bedtime.     . metoprolol (LOPRESSOR) 50 MG tablet Take 1 tablet (50 mg total) by mouth 2 (two) times daily.    . Multiple Vitamin (MULTIVITAMIN) tablet Take 1 tablet by mouth daily.      . Omega-3 Fatty Acids (FISH OIL) 1000 MG CAPS Take 1 capsule by mouth daily.    . pantoprazole (PROTONIX) 40 MG tablet Take 40 mg by mouth daily.    Marland Kitchen senna (SENOKOT) 8.6 MG tablet Take 1 tablet by mouth daily as needed for constipation.     . metolazone (ZAROXOLYN) 2.5 MG tablet Take 1 tablet by mouth daily.    . Red Yeast Rice Extract (RED YEAST RICE PO) Take 1 tablet by mouth daily.     No facility-administered medications prior to visit.      Allergies:   Clindamycin; Hydrocodone; Penicillins; Procaine; Procaine hcl; Sulfonamide derivatives; Cephalexin; and Mold extract [trichophyton]   Social History   Social History  . Marital status: Married    Spouse name: N/A  . Number of children: N/A  . Years of education: N/A   Occupational History  . Retired     Theme park manager  .  [place] History Main Topics  . Smoking status: Never Smoker  . Smokeless tobacco: Never Used  . Alcohol use No  . Drug use: No  . Sexual activity: No   Other Topics Concern  . 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   ROS:   Please see the history of present illness.    ROS All other systems reviewed and are negative.   PHYSICAL EXAM:    VS:  BP (!) 112/57   Pulse (!) 51   Ht 5\' 10"  (1.778 m)   Wt 229 lb (103.9 kg)   BMI 32.86 kg/m    GEN: Well nourished, well developed, in no acute distress  HEENT: normal  Neck: no JVD, carotid bruits, or masses Cardiac: Irregularly irregular; no murmurs, rubs, or gallops. Bilateral lower extremity edema, worse on the left side. Right lower extremity has a woody appearance Respiratory:  clear to auscultation bilaterally, normal work of breathing GI: soft, nontender, nondistended, + BS MS: no deformity or atrophy  Skin: warm and dry, no rash Neuro:  Alert and Oriented x 3, Strength and sensation are intact Psych: euthymic mood, full affect  Wt Readings from Last 3 Encounters:  04/02/17 229 lb (103.9 kg)  02/13/17 230 lb (104.3 kg)  01/22/17 229 lb (103.9 kg)      Studies/Labs Reviewed:   EKG:  EKG is not ordered today.   Recent Labs: 01/09/2017: B Natriuretic Peptide 169.0; Hemoglobin 11.1; Platelets 392 01/10/2017: TSH 4.678 03/12/2017: BUN 37; Creat 1.54; Potassium 4.9; Sodium 136   Lipid Panel    Component Value Date/Time   CHOL  02/28/2009 1631    154        ATP III CLASSIFICATION:  <200     mg/dL   Desirable  200-239  mg/dL   Borderline High  >=240    mg/dL   High          TRIG 296 (H) 02/28/2009 1631   HDL 23 (L) 02/28/2009 1631   CHOLHDL 6.7 02/28/2009 1631   VLDL 59 (H) 02/28/2009 1631   LDLCALC  02/28/2009 1631    72        Total Cholesterol/HDL:CHD Risk Coronary Heart Disease Risk Table                     Men   Women  1/2 Average Risk   3.4   3.3  Average Risk       5.0   4.4  2 X Average Risk   9.6   7.1  3 X Average Risk  23.4   11.0        Use the calculated Patient Ratio above and the CHD Risk Table to determine the patient's CHD Risk.        ATP III CLASSIFICATION (LDL):  <100     mg/dL   Optimal  100-129  mg/dL   Near or Above                    Optimal  130-159  mg/dL   Borderline  160-189  mg/dL   High  >190     mg/dL   Very High     Additional studies/ records that were reviewed today include:   Echo 01/11/2017 LV EF: 60% -   65%  Study Conclusions  - Left ventricle: The cavity size was normal. There was mild   concentric hypertrophy. Systolic function was normal. The   estimated ejection fraction was in the range of 60% to 65%. Wall   motion was normal; there were no regional wall motion   abnormalities. - Aortic valve: Valve mobility was restricted. There was mild   stenosis. There was mild regurgitation.   ASSESSMENT:    1. Chronic diastolic heart failure ([place])   2. Medication management   3. Hypothyroidism, unspecified type   4. OSA (obstructive sleep apnea)   5. Controlled type 2 diabetes mellitus without complication, unspecified whether long term insulin use ([place])   6. Chronic atrial fibrillation (HCC)      PLAN:  In order of problems listed above:  1. Chronic diastolic heart failure: Continue to have bilateral lower extremity edema, worse on the  left side. He had worsening renal function on 40 mg Lasix with metolazone, his Lasix was later decreased to 20 mg along with metolazone. We'll continue current medication. I will obtain basic metabolic panel. If basic metabolic panel shows worsening renal function, low threshold to cut back on metolazone and increase Lasix instead.  2. DM II: Had elevated hemoglobin A1c in 2010, does not appears to be on diabetes medications this time. We'll defer to primary care provider.  3. Chronic atrial fibrillation: On aspirin due to recurrent fall and also history of GI bleed. He has fallen at least 6 times in the last year. Risk of bleeding quite high compared to stroke.  - This patients CHA2DS2-VASc Score and unadjusted Ischemic Stroke Rate (% per year) is equal to 4.8 % stroke rate/year from a score of 4  Above score calculated as 1 point each if present [CHF, HTN, DM, Vascular=MI/PAD/Aortic Plaque, Age if 65-74, or Male] Above score calculated as 2 points  each if present [Age > 75, or Stroke/TIA/TE]  4. Hypothyroidism: On Synthroid    Medication Adjustments/Labs and Tests Ordered: Current medicines are reviewed at length with the patient today.  Concerns regarding medicines are outlined above.  Medication changes, Labs and Tests ordered today are listed in the Patient Instructions below. Patient Instructions  Medication Instructions: No changes  Labwork: Please have the BMET drawn today.   Follow-Up: Keep your follow up appointment in August with Dr. Gwenlyn Found.  Any Additional Special Instructions Will Be Listed Below (If Applicable).  Heart Failure prevention plan:  1. Avoid salt 2. Limit daily fluid intake to < 2 liters or between 32 to 64 oz 3. Monitor weight daily, take additional 20mg  lasix if weight increase by more than 3 lbs overnight or 5 lbs in a single week.    Hilbert Corrigan, [name]  04/02/2017 6:13 PM    [facility] [address], [place], [place]  [address] Phone: [phone]; Fax: [phone]

## 2017-04-02 NOTE — Patient Instructions (Addendum)
Medication Instructions: No changes  Labwork: Please have the BMET drawn today.   Follow-Up: Keep your follow up appointment in August with Dr. Gwenlyn Found.  Any Additional Special Instructions Will Be Listed Below (If Applicable).  Heart Failure prevention plan:  1. Avoid salt 2. Limit daily fluid intake to < 2 liters or between 32 to 64 oz 3. Monitor weight daily, take additional 20mg  lasix if weight increase by more than 3 lbs overnight or 5 lbs in a single week.

## 2017-04-03 LAB — BASIC METABOLIC PANEL
BUN / CREAT RATIO: 27 — AB (ref 10–24)
BUN: 42 mg/dL — AB (ref 8–27)
CHLORIDE: 104 mmol/L (ref 96–106)
CO2: 21 mmol/L (ref 20–29)
Calcium: 9 mg/dL (ref 8.6–10.2)
Creatinine, Ser: 1.54 mg/dL — ABNORMAL HIGH (ref 0.76–1.27)
GFR calc non Af Amer: 40 mL/min/{1.73_m2} — ABNORMAL LOW (ref >59)
GFR, EST AFRICAN AMERICAN: 47 mL/min/{1.73_m2} — AB (ref >59)
Glucose: 124 mg/dL — ABNORMAL HIGH (ref 65–99)
POTASSIUM: 5 mmol/L (ref 3.5–5.2)
SODIUM: 142 mmol/L (ref 134–144)

## 2017-04-03 NOTE — Progress Notes (Signed)
Continue on current therapy. Renal function still not at baseline but very much stable and essentially unchanged when compare to 3 weeks ago

## 2017-05-08 DIAGNOSIS — Z6833 Body mass index (BMI) 33.0-33.9, adult: Secondary | ICD-10-CM | POA: Diagnosis not present

## 2017-05-08 DIAGNOSIS — L97921 Non-pressure chronic ulcer of unspecified part of left lower leg limited to breakdown of skin: Secondary | ICD-10-CM | POA: Diagnosis not present

## 2017-05-08 DIAGNOSIS — I10 Essential (primary) hypertension: Secondary | ICD-10-CM | POA: Diagnosis not present

## 2017-05-08 DIAGNOSIS — R600 Localized edema: Secondary | ICD-10-CM | POA: Diagnosis not present

## 2017-05-08 DIAGNOSIS — I480 Paroxysmal atrial fibrillation: Secondary | ICD-10-CM | POA: Diagnosis not present

## 2017-05-08 DIAGNOSIS — L97811 Non-pressure chronic ulcer of other part of right lower leg limited to breakdown of skin: Secondary | ICD-10-CM | POA: Diagnosis not present

## 2017-05-08 DIAGNOSIS — I5031 Acute diastolic (congestive) heart failure: Secondary | ICD-10-CM | POA: Diagnosis not present

## 2017-05-12 ENCOUNTER — Encounter (HOSPITAL_COMMUNITY): Payer: Self-pay | Admitting: Physical Therapy

## 2017-05-12 ENCOUNTER — Ambulatory Visit (HOSPITAL_COMMUNITY): Payer: PPO | Attending: Internal Medicine | Admitting: Physical Therapy

## 2017-05-12 DIAGNOSIS — R2681 Unsteadiness on feet: Secondary | ICD-10-CM

## 2017-05-12 DIAGNOSIS — L97909 Non-pressure chronic ulcer of unspecified part of unspecified lower leg with unspecified severity: Secondary | ICD-10-CM | POA: Insufficient documentation

## 2017-05-12 DIAGNOSIS — S81801S Unspecified open wound, right lower leg, sequela: Secondary | ICD-10-CM

## 2017-05-12 DIAGNOSIS — S81802S Unspecified open wound, left lower leg, sequela: Secondary | ICD-10-CM

## 2017-05-12 NOTE — Therapy (Signed)
[place], [place], [address] Phone: [phone]   Fax:  [phone]  Wound Care Evaluation  Patient Details  Name: [name] MRN: [id number] Date of Birth: 03-14-1929 Referring Provider: Celene Squibb   Encounter Date: 05/12/2017      PT End of Session - 05/12/17 1229    Visit Number 1   Number of Visits 9   Date for PT Re-Evaluation 06/09/17   Authorization Type [employer]    Authorization Time Period 05/12/17 to 06/09/17   Authorization - Visit Number 1   Authorization - Number of Visits 10   PT Start Time 0947   PT Stop Time 1034   PT Time Calculation (min) 47 min   Activity Tolerance Patient tolerated treatment well   Behavior During Therapy [facility] for tasks assessed/performed      Past Medical History:  Diagnosis Date  . Allergic rhinitis   . Asthmatic bronchitis   . Atrial fibrillation ([place])   . Celiac disease   . Cellulitis   . Diverticulosis   . DM (diabetes mellitus) ([place])   . Fx. left wrist 1987  . Gastric polyps   . GERD (gastroesophageal reflux disease)   . History of pneumonia    w/pleural effusion  . Hypothyroidism   . Iron deficiency anemia   . Melanoma ([place])   . Neuropathy   . Prostate cancer ([place])   . REM sleep behavior disorder   . RLS (restless legs syndrome)   . Sleep apnea     Past Surgical History:  Procedure Laterality Date  . APPENDECTOMY    . CARPAL TUNNEL RELEASE    . CATARACT EXTRACTION  03/2011   bilateral   . CHOLECYSTECTOMY  2001  . COLONOSCOPY W/ BIOPSIES  04/27/2008   diverticulosis, duodenitis  . FOOT SURGERY    . HERNIA REPAIR  1994   bilateral  . KNEE SURGERY     x 2  . LUNG SURGERY  2001  . PROSTATECTOMY  2002  . UPPER GASTROINTESTINAL ENDOSCOPY  04/27/2008   celiac disease, gastric polyps    There were no vitals filed for this visit.        [facility] PT Assessment - 05/12/17 0001      Assessment   Medical Diagnosis status ulcers     Referring Provider Celene Squibb    Onset Date/Surgical Date --  2 months ago    Next MD Visit Dr. [place] Crane in November    Prior Therapy none      Precautions   Precautions Fall     Balance Screen   Has the patient fallen in the past 6 months Yes   How many times? 2   Has the patient had a decrease in activity level because of a fear of falling?  Yes   Is the patient reluctant to leave their home because of a fear of falling?  No     Prior Function   Level of Independence Independent;Independent with basic ADLs;Independent with gait;Independent with transfers   [place] time employment   Vocation Requirements pastor    Leisure reading          Wound Therapy - 05/12/17 1217    Wound Properties Date First Assessed: 05/12/17 Wound Type: Venous stasis ulcer Location: Leg Location Orientation: Right;Anterior Wound Description (Comments): right anterior superior    Dressing Type None   % Wound base Red or Granulating 20%   % Wound base  Yellow/Fibrinous Exudate 80%   Wound Length (cm) 1.4 cm   Wound Width (cm) 1.2 cm   Wound Depth (cm) --  skin depth    Closure None   Drainage Amount Minimal   Drainage Description Serosanguineous   Treatment Cleansed;Packing (Impregnated strip)  cleansed, silver, profore lite    Wound Properties Date First Assessed: 05/12/17 Wound Type: Venous stasis ulcer Location: Leg Location Orientation: Right;Anterior Wound Description (Comments): right anterior lower  Present on Admission: Yes   Dressing Type None   % Wound base Red or Granulating 100%   Wound Length (cm) 2 cm   Wound Width (cm) 0.8 cm   Wound Depth (cm) --  skin depth    Closure None   Drainage Amount Minimal   Drainage Description Serosanguineous   Treatment Cleansed;Packing (Impregnated strip);Other (Comment)  cleansed, xeroform, profore lite    Wound Properties Date First Assessed: 05/12/17 Wound Type: Venous stasis ulcer Location: Leg Location Orientation: Left;Anterior Wound  Description (Comments): left anterior superior    Dressing Type None   % Wound base Red or Granulating 100%   Wound Length (cm) 2 cm   Wound Width (cm) 1 cm   Wound Depth (cm) --  skin depth    Closure None   Drainage Amount Minimal   Drainage Description Serosanguineous   Treatment Cleansed;Packing (Impregnated strip)  cleansed, xeroform, profore lite    Wound Properties Date First Assessed: 05/12/17 Wound Type: Venous stasis ulcer Location: Leg Location Orientation: Left;Anterior Wound Description (Comments): left anterior middle    Dressing Type None   % Wound base Red or Granulating 100%   Wound Length (cm) 1 cm   Wound Width (cm) 2.2 cm   Drainage Amount Moderate   Drainage Description Serosanguineous   Treatment Cleansed;Packing (Impregnated strip)  cleaned, lanced blister, xeroform, profore lite    Wound Properties Date First Assessed: 05/12/17 Wound Type: Venous stasis ulcer Location: Leg Location Orientation: Left;Anterior Wound Description (Comments): left anterior lower    Dressing Type None   % Wound base Red or Granulating 80%   % Wound base Yellow/Fibrinous Exudate 20%   Wound Length (cm) 2 cm   Wound Width (cm) 2 cm   Wound Depth (cm) --  skin depth    Closure None   Drainage Amount Moderate   Treatment Cleansed;Packing (Impregnated strip)  cleansed, xeroform, profore lite    Wound Properties Date First Assessed: 05/12/17 Wound Type: Venous stasis ulcer Location: Leg Location Orientation: Left;Anterior Wound Description (Comments): left anterior lateral    Dressing Type None   % Wound base Red or Granulating 100%   Wound Length (cm) 1 cm   Wound Width (cm) 0.8 cm   Wound Depth (cm) --  skin depth    Closure None   Drainage Amount Moderate   Drainage Description Serosanguineous   Treatment Cleansed;Packing (Impregnated strip)  cleansed, xeroform, profore lite    Wound Therapy - Clinical Statement Patient arrives with B LE wounds that appear to be venous in  nature and have been present for the past 2 months; he usually wears compression stockings but has not recently due to these wounds opening up. Examination reveals multiple open sores on B LEs, including a blister anterior L LE which was lanced and drained this session. Noted 7 large wound areas as well as small blisters beginning to form; dressed these areas with xeroform and silver as appropriate and applied profore lites today. Patient will benefit from skilled PT services to facilitate wound healing moving  forward.    Wound Therapy - Functional Problem List non-healing wounds    Factors Delaying/Impairing Wound Healing Polypharmacy;Diabetes Mellitus   Wound Therapy - Frequency Other (comment)  x2/week    Wound Therapy - Current Recommendations PT   Wound Plan continue dressings as appropriate; if he does well with profore lite, trail full profore. F/U on compression stocking referral             Objective measurements completed on examination: See above findings.                PT Education - 05/12/17 01/07/1227    Education provided Yes   Education Details prognosis, importance of new compression stockings every 6 months; do not take profores off unless his legs/toes hurt more than normal or change color; do not get profores wet    Person(s) Educated Patient;Spouse   Methods Explanation   Comprehension Verbalized understanding          PT Short Term Goals - 05/12/17 1230      PT SHORT TERM GOAL #1   Title Patient and wife to verbalize improtance of consitent compression and need to get new compression stockings every 6 months to avoid formation of more wounds    Time 2   Period Weeks   Status New   Target Date 05/26/17     PT SHORT TERM GOAL #2   Title All wounds to be 100% granulated and have decreased in size by at least 50% all measured parameters    Time 2   Period Weeks   Status New           PT Long Term Goals - 05/12/17 1231      PT LONG TERM GOAL  #1   Title Patient to demonstrate that all wounds have healed to show resolution of condition    Time 4   Period Weeks   Status New     PT LONG TERM GOAL #2   Title Patient to be consistently using LRAD for mobility to reduce fall risk    Time 4   Period Weeks   Status New            Patient will benefit from skilled therapeutic intervention in order to improve the following deficits and impairments:     Visit Diagnosis: Open wound of left lower leg, sequela - Plan: PT plan of care cert/re-cert  Open wound of right lower leg, sequela - Plan: PT plan of care cert/re-cert  Unsteadiness on feet - Plan: PT plan of care cert/re-cert      G-Codes - 60/45/40 1231/01/07    Functional Assessment Tool Used (Outpatient Only) Based on skilled clinical assessment of wound status, general steadiness    Functional Limitation Other PT primary   Other PT Primary Current Status (J8119) At least 20 percent but less than 40 percent impaired, limited or restricted   Other PT Primary Goal Status (J4782) At least 1 percent but less than 20 percent impaired, limited or restricted      Problem List Patient Active Problem List   Diagnosis Date Noted  . Afib ([place]) 01/10/2017  . SOB (shortness of breath) 01/10/2017  . Acute diastolic CHF (congestive heart failure) ([place]) 01/10/2017  . Atrial fibrillation with RVR ([place]) 01/10/2017  . Claudication ([place]) 07/25/2016  . Paroxysmal atrial fibrillation ([place]) 07/25/2016  . Odynophagia 04/10/2014  . Allergic rhinitis 03/23/2014  . Chronic rhinitis 12/22/2013  . 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 Constipation 08/22/2011  .  Obesity 09/20/2009  . G E R D 11/29/2007  . Celiac disease 11/29/2007    Deniece Ree PT, DPT [facility] [address] [place], [place], [address] Phone: [phone]   Fax:  [phone]  Name: KEY CEN MRN: [id number] Date of Birth: Aug 27, 1929

## 2017-05-14 ENCOUNTER — Ambulatory Visit (HOSPITAL_COMMUNITY): Payer: PPO | Attending: Internal Medicine

## 2017-05-14 DIAGNOSIS — R2689 Other abnormalities of gait and mobility: Secondary | ICD-10-CM | POA: Diagnosis not present

## 2017-05-14 DIAGNOSIS — X58XXXS Exposure to other specified factors, sequela: Secondary | ICD-10-CM | POA: Insufficient documentation

## 2017-05-14 DIAGNOSIS — Z9181 History of falling: Secondary | ICD-10-CM | POA: Insufficient documentation

## 2017-05-14 DIAGNOSIS — S81802S Unspecified open wound, left lower leg, sequela: Secondary | ICD-10-CM | POA: Insufficient documentation

## 2017-05-14 DIAGNOSIS — S81801S Unspecified open wound, right lower leg, sequela: Secondary | ICD-10-CM | POA: Insufficient documentation

## 2017-05-14 DIAGNOSIS — R2681 Unsteadiness on feet: Secondary | ICD-10-CM | POA: Diagnosis not present

## 2017-05-14 NOTE — Therapy (Signed)
[place], [place], [address] Phone: [phone]   Fax:  [phone]  Wound Care Therapy  Patient Details  Name: [name] MRN: [id number] Date of Birth: [birth year] Referring Provider: Celene Squibb   Encounter Date: 05/14/2017      PT End of Session - 05/14/17 1423    Visit Number 2   Number of Visits 9   Date for PT Re-Evaluation 06/09/17   Authorization Type [employer]    Authorization Time Period 05/12/17 to 06/09/17   Authorization - Visit Number 2   Authorization - Number of Visits 10   PT Start Time 0910   PT Stop Time 1015   PT Time Calculation (min) 65 min   Activity Tolerance Patient tolerated treatment well   Behavior During Therapy [facility] for tasks assessed/performed      Past Medical History:  Diagnosis Date  . Allergic rhinitis   . Asthmatic bronchitis   . Atrial fibrillation ([place])   . Celiac disease   . Cellulitis   . Diverticulosis   . DM (diabetes mellitus) ([place])   . Fx. left wrist 1987  . Gastric polyps   . GERD (gastroesophageal reflux disease)   . History of pneumonia    w/pleural effusion  . Hypothyroidism   . Iron deficiency anemia   . Melanoma ([place])   . Neuropathy   . Prostate cancer ([place])   . REM sleep behavior disorder   . RLS (restless legs syndrome)   . Sleep apnea     Past Surgical History:  Procedure Laterality Date  . APPENDECTOMY    . CARPAL TUNNEL RELEASE    . CATARACT EXTRACTION  03/2011   bilateral   . CHOLECYSTECTOMY  2001  . COLONOSCOPY W/ BIOPSIES  04/27/2008   diverticulosis, duodenitis  . FOOT SURGERY    . HERNIA REPAIR  1994   bilateral  . KNEE SURGERY     x 2  . LUNG SURGERY  2001  . PROSTATECTOMY  2002  . UPPER GASTROINTESTINAL ENDOSCOPY  04/27/2008   celiac disease, gastric polyps    There were no vitals filed for this visit.       Subjective Assessment - 05/14/17 1019    Subjective Pt stated pain scale 2-3/10 Lt LE,  dressings have slid down   Currently in Pain? Yes   Pain Score 3    Pain Location Leg   Pain Orientation Left;Anterior                   Wound Therapy - 05/14/17 1019    Subjective Pt stated pain scale 2-3/10 Lt LE, dressings have slid down   Patient and Family Stated Goals wounds to heal    Date of Onset --  2 months ago   Prior Treatments antibiotic cream from MD    Pain Assessment 0-10   Evaluation and Treatment Procedures Explained to Patient/Family Yes   Evaluation and Treatment Procedures agreed to   Wound Properties Date First Assessed: 05/12/17 Wound Type: Venous stasis ulcer Location: Leg Location Orientation: Right;Anterior Wound Description (Comments): right anterior superior    Dressing Type Impregnated gauze (petrolatum);Compression wrap  xeroform and profore   Dressing Changed Changed   Dressing Status Clean;Dry;Intact   Site / Wound Assessment Clean;Dry;Granulation tissue  weeping wounds   % Wound base Red or Granulating 80%   % Wound base Yellow/Fibrinous Exudate 30%   Wound Length (cm) 1.3 cm  was 1.4  Wound Width (cm) 1.2 cm  was 1.2   Wound Depth (cm) 0 cm   Closure None   Drainage Amount Minimal  weeping wound   Drainage Description Serosanguineous   Treatment Cleansed;Debridement (Selective)   Wound Properties Date First Assessed: 05/12/17 Wound Type: Venous stasis ulcer Location: Leg Location Orientation: Right;Anterior Wound Description (Comments): right anterior lower  Present on Admission: Yes   Dressing Type Impregnated gauze (petrolatum)  xeroform and profore   Dressing Changed Changed   Dressing Status Clean;Dry;Intact   Site / Wound Assessment Clean;Dry;Granulation tissue  weeping wound   % Wound base Red or Granulating 100%   Wound Length (cm) 1.8 cm   Wound Width (cm) 0.8 cm   Wound Depth (cm) 0 cm   Closure None   Drainage Amount Minimal  weeping wound   Drainage Description Serosanguineous   Treatment Cleansed;Debridement  (Selective)   Wound Properties Date First Assessed: 05/12/17 Wound Type: Venous stasis ulcer Location: Leg Location Orientation: Left;Anterior Wound Description (Comments): left anterior superior    Dressing Type Impregnated gauze (petrolatum);Compression wrap  xeroform and profore   Dressing Changed Changed   Dressing Status Clean;Dry;Intact   Site / Wound Assessment Clean;Dry;Granulation tissue  weeping wounds   % Wound base Red or Granulating 100%   Wound Length (cm) 1.8 cm   Wound Width (cm) 1 cm   Closure None   Drainage Amount Minimal  weeping wound   Drainage Description Serosanguineous   Treatment Cleansed;Debridement (Selective)   Wound Properties Date First Assessed: 05/12/17 Wound Type: Venous stasis ulcer Location: Leg Location Orientation: Left;Anterior Wound Description (Comments): left anterior middle    Dressing Type Impregnated gauze (petrolatum);Compression wrap  xeroform, ABD pad and profore   Dressing Changed Changed   Dressing Status Clean;Dry;Intact   Site / Wound Assessment Clean;Dry;Granulation tissue  weeping wound   % Wound base Red or Granulating 100%   % Wound base Yellow/Fibrinous Exudate 0%   Wound Length (cm) 1 cm   Wound Width (cm) 2.2 cm   Drainage Amount Moderate  weeping wound   Drainage Description Serosanguineous   Treatment Cleansed;Debridement (Selective)   Wound Properties Date First Assessed: 05/12/17 Wound Type: Venous stasis ulcer Location: Leg Location Orientation: Left;Anterior Wound Description (Comments): left anterior lower    Dressing Type Impregnated gauze (petrolatum);Compression wrap  xeroform, ABD and profore   Dressing Changed Changed   Dressing Status Clean;Dry;Intact   % Wound base Red or Granulating 95%   % Wound base Yellow/Fibrinous Exudate 5%   Wound Length (cm) 1.8 cm   Wound Width (cm) 2 cm   Wound Depth (cm) 0 cm   Closure None   Drainage Amount Minimal  weeping wound   Treatment Cleansed;Debridement  (Selective)   Wound Properties Date First Assessed: 05/12/17 Wound Type: Venous stasis ulcer Location: Leg Location Orientation: Left;Anterior Wound Description (Comments): left anterior lateral    Dressing Type Impregnated gauze (petrolatum);Compression wrap  xeroform, ABD and profore   Dressing Changed Changed   Dressing Status Clean;Dry;Intact   Site / Wound Assessment Clean;Dry;Granulation tissue  xeroform, ABD and profore   % Wound base Red or Granulating 100%   Wound Length (cm) 1 cm   Wound Width (cm) 0.8 cm   Closure None   Drainage Amount Moderate  weeping wound   Drainage Description Serosanguineous   Treatment Cleansed;Debridement (Selective)   Selective Debridement - Location anterior shin BLE   Selective Debridement - Tools Used Forceps   Selective Debridement - Tissue Removed slough and weeping  drainage   Wound Therapy - Clinical Statement Selective debridment for removal of slough on wound bed and dry skin perimeter to promote healing.  Additional layer applied at ankle to reduce risk of dressings sliding down.  No reports of pain through session.    Wound Therapy - Functional Problem List non-healing wounds    Factors Delaying/Impairing Wound Healing Polypharmacy;Diabetes Mellitus   Wound Therapy - Frequency --  2x/week   Wound Therapy - Current Recommendations PT   Wound Plan Continue wiht appropriate dressing and profore for edema control.  F/U on compression hose referral.    Dressing  xeroform, ABD pad on Lt wiht profore BLE                   PT Short Term Goals - 05/12/17 1230      PT SHORT TERM GOAL #1   Title Patient and wife to verbalize improtance of consitent compression and need to get new compression stockings every 6 months to avoid formation of more wounds    Time 2   Period Weeks   Status New   Target Date 05/26/17     PT SHORT TERM GOAL #2   Title All wounds to be 100% granulated and have decreased in size by at least 50% all measured  parameters    Time 2   Period Weeks   Status New           PT Long Term Goals - 05/12/17 1231      PT LONG TERM GOAL #1   Title Patient to demonstrate that all wounds have healed to show resolution of condition    Time 4   Period Weeks   Status New     PT LONG TERM GOAL #2   Title Patient to be consistently using LRAD for mobility to reduce fall risk    Time 4   Period Weeks   Status New             Patient will benefit from skilled therapeutic intervention in order to improve the following deficits and impairments:     Visit Diagnosis: Open wound of left lower leg, sequela  Open wound of right lower leg, sequela  Unsteadiness on feet     Problem List Patient Active Problem List   Diagnosis Date Noted  . Afib ([place]) 01/10/2017  . SOB (shortness of breath) 01/10/2017  . Acute diastolic CHF (congestive heart failure) ([place]) 01/10/2017  . Atrial fibrillation with RVR ([place]) 01/10/2017  . Claudication ([place]) 07/25/2016  . Paroxysmal atrial fibrillation ([place]) 07/25/2016  . Odynophagia 04/10/2014  . Allergic rhinitis 03/23/2014  . Chronic rhinitis 12/22/2013  . 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 Constipation 08/22/2011  . Obesity 09/20/2009  . G E R D 11/29/2007  . Celiac disease 11/29/2007   [place], [place]; [place]  Aldona Lento 05/14/2017, 2:25 PM  [facility] [address] [place], [place], [address] Phone: [phone]   Fax:  [phone]  Name: [name] MRN: [id number] Date of Birth: 1928-11-10

## 2017-05-15 ENCOUNTER — Encounter: Payer: Self-pay | Admitting: Cardiovascular Disease

## 2017-05-15 ENCOUNTER — Ambulatory Visit (INDEPENDENT_AMBULATORY_CARE_PROVIDER_SITE_OTHER): Payer: PPO | Admitting: Cardiovascular Disease

## 2017-05-15 VITALS — BP 100/54 | HR 70 | Ht 70.0 in | Wt 228.0 lb

## 2017-05-15 DIAGNOSIS — I480 Paroxysmal atrial fibrillation: Secondary | ICD-10-CM | POA: Diagnosis not present

## 2017-05-15 MED ORDER — METOPROLOL TARTRATE 25 MG PO TABS: 25.0000 mg | ORAL_TABLET | Freq: Two times a day (BID) | ORAL | 3 refills | Status: DC

## 2017-05-15 NOTE — Addendum Note (Signed)
Addended by: Therisa Doyne on: 05/15/2017 02:50 PM   Modules accepted: Orders

## 2017-05-15 NOTE — Assessment & Plan Note (Signed)
History of acute on chronic diastolic heart failure with 2-D echocardiogram performed 01/11/17 revealing normal LV systolic function with mild aortic stenosis. He had a peak gradient of 25 mmHg.

## 2017-05-15 NOTE — Assessment & Plan Note (Signed)
History of persistent A. fib not on oral regulation because of history of GI bleed and fall risk. He is rate controlled on diltiazem and metoprolol. He does have excessive lower extremity edema which may be related to calcium channel blocker effect. I am going to stop his Cardizem and increase his beta blocker.

## 2017-05-15 NOTE — Patient Instructions (Signed)
Medication Instructions: Your physician recommends that you continue on your current medications as directed. Please refer to the Current Medication list given to you today.  Discontinue Cardizem (Diltiazem)  Increase Metoprolol to 75 mg twice daily---Take 25 mg tablet along with 50 mg to equal 75 mg twice daily.   Labwork: Your physician recommends that you return for lab work today: BMET   Follow-Up: We request that you follow-up in: 3-4 weeks with Calvin Deforest, PA and in 3 months with Dr [name] will receive a reminder letter in the mail two months in advance. If you don't receive a letter, please call our office to schedule the follow-up appointment.  If you need a refill on your cardiac medications before your next appointment, please call your pharmacy.

## 2017-05-15 NOTE — Progress Notes (Signed)
05/15/2017 GERBER PENZA   August 14, 1929  [id number]  Primary Physician Celene Squibb, MD Primary Cardiologist: Lorretta Harp MD Garret Reddish, [place], [place]  HPI:  [name] is a 83 y.o. male   mild to moderately overweight married Caucasian male father of 48, grandfather and 6 grandchildren who is accompanied by his wife [name] today. I last saw him in the office 02/13/17.Marland Kitchen He was referred by Dr. Wende Neighbors for peripheral vascular evaluation. He is a Engineering geologist. He lives in [place]. He has no cardiac risk factors. He does complain of lifestyle limiting bilateral lower extremity claudication. There also was a question of recently diagnosed atrial fibrillation. He has never had a heart attack or stroke. He denies chest pain but does get some some shortness of breath on exertion especially when his legs are bothering him. He did have lower extremity Doppler studies that revealed normal ABIs bilaterally. In addition, he had an event monitor that showed A. fib with RVR. I elected not to anticoagulate him because of fall risk. He was admitted to the hospital on 9/47/65 with diastolic heart failure and discharged 3 days later. He was diuresed 3 L. He was recently placed on Zaroxolyn by his PCP in the morning prior to his a.m. Lasix dose. He weighs himself on a daily basis. We reiterated the importance of salt restriction. Because of slightly increasing weight and lower extremity edema added low-dose Zaroxolyn. His serum creatinine has somewhat increased. It is possible that this is also somewhat related to the diltiazem.  Current Meds  Medication Sig  . Alpha-Lipoic Acid 300 MG CAPS Take 1 capsule by mouth daily.    Marland Kitchen amitriptyline (ELAVIL) 75 MG tablet Take 75 mg by mouth at bedtime.   . Ascorbic Acid (VITAMIN C) 1000 MG tablet Take 1,000 mg by mouth 2 (two) times daily.    Marland Kitchen aspirin 81 MG tablet Take 81 mg by mouth at bedtime.   Marland Kitchen azelastine (ASTELIN) 0.1 % nasal spray Place 1  spray into both nostrils at bedtime.   . Cholecalciferol (VITAMIN D3) 1000 UNITS CAPS Take 1 tablet by mouth 3 (three) times daily.   . Coenzyme Q10 (COQ10) 200 MG CAPS Take 1 capsule by mouth daily.    . fluticasone (FLONASE) 50 MCG/ACT nasal spray Place 1 spray into both nostrils at bedtime.   . furosemide (LASIX) 20 MG tablet Take 1 tablet (20 mg total) by mouth daily.  Marland Kitchen levocetirizine (XYZAL) 5 MG tablet Take 5 mg by mouth at bedtime.   Marland Kitchen levothyroxine (SYNTHROID, LEVOTHROID) 150 MCG tablet Take 150 mcg by mouth daily before breakfast.  . losartan (COZAAR) 100 MG tablet Take 50 mg by mouth daily.   . magnesium gluconate (MAGONATE) 500 MG tablet Take 500 mg by mouth daily.    . Melatonin 3 MG TABS Take 1 tablet by mouth at bedtime.   . metolazone (ZAROXOLYN) 2.5 MG tablet Take 1 tablet (2.5 mg total) by mouth daily.  . metoprolol (LOPRESSOR) 50 MG tablet Take 1 tablet (50 mg total) by mouth 2 (two) times daily.  . Multiple Vitamin (MULTIVITAMIN) tablet Take 1 tablet by mouth daily.    . Omega-3 Fatty Acids (FISH OIL) 1000 MG CAPS Take 1 capsule by mouth daily.  . pantoprazole (PROTONIX) 40 MG tablet Take 40 mg by mouth daily.  Marland Kitchen senna (SENOKOT) 8.6 MG tablet Take 1 tablet by mouth daily as needed for constipation.   . [DISCONTINUED] diltiazem (CARDIZEM  CD) 240 MG 24 hr capsule Take 1 capsule (240 mg total) by mouth daily.     Allergies  Allergen Reactions  . Clindamycin Diarrhea  . Hydrocodone   . Penicillins     Has patient had a PCN reaction causing immediate rash, facial/tongue/throat swelling, SOB or lightheadedness with hypotension: No Has patient had a PCN reaction causing severe rash involving mucus membranes or skin necrosis: No Has patient had a PCN reaction that required hospitalization No Has patient had a PCN reaction occurring within the last 10 years: No If all of the above answers are "NO", then may proceed with Cephalosporin use.   . Procaine Other (See Comments)     "Passes out"  . Procaine Hcl   . Sulfonamide Derivatives   . Cephalexin Nausea And Vomiting    Dry hives, "violent" diarhea  . Mold Extract [Trichophyton] Other (See Comments)    Stuffiness, post nasal drip    Social History   Social History  . Marital status: Married    Spouse name: N/A  . Number of children: N/A  . Years of education: N/A   Occupational History  . Retired     Theme park manager  .  [place] History Main Topics  . Smoking status: Never Smoker  . Smokeless tobacco: Never Used  . Alcohol use No  . Drug use: No  . Sexual activity: No   Other Topics Concern  . 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  Cardiovascular: negative for chest pain, dyspnea on exertion, edema, orthopnea, palpitations, paroxysmal nocturnal dyspnea or shortness of breath Dermatological: negative for rash Respiratory: negative for cough or wheezing Urologic: negative for hematuria Abdominal: negative for nausea, vomiting, diarrhea, bright red blood per rectum, melena, or hematemesis Neurologic: negative for visual changes, syncope, or dizziness All other systems reviewed and are otherwise negative except as noted above.    Blood pressure (!) 100/54, pulse 70, height 5\' 10"  (1.778 m), weight 228 lb (103.4 kg).  General appearance: alert and no distress Neck: no adenopathy, no carotid bruit, no JVD, supple, symmetrical, trachea midline and thyroid not enlarged, symmetric, no tenderness/mass/nodules Lungs: clear to auscultation bilaterally Heart: irregularly irregular rhythm Extremitie3+ pitting edema. Both his legs are wrapped and apparently has weeping wounds.3+ pitting edema. Both his legs are wrapped and apparently has weeping wounds.  EKG atrial fibrillation with a ventricular response of 70 and right bundle branch  block. I personally reviewed this EKG.  ASSESSMENT AND PLAN:   Paroxysmal atrial fibrillation (HCC) History of persistent A. fib not on oral regulation because of history of GI bleed and fall risk. He is rate controlled on diltiazem and metoprolol. He does have excessive lower extremity edema which may be related to calcium channel blocker effect. I am going to stop his Cardizem and increase his beta blocker.  Acute diastolic CHF (congestive heart failure) (HCC) History of acute on chronic diastolic heart failure with 2-D echocardiogram performed 01/11/17 revealing normal LV systolic function with mild aortic stenosis. He had a peak gradient of 25 mmHg.      Lorretta Harp MD FACP,FACC,FAHA, [facility] 05/15/2017 2:43 PM

## 2017-05-16 LAB — BASIC METABOLIC PANEL
BUN / CREAT RATIO: 23 (ref 10–24)
BUN: 37 mg/dL — AB (ref 8–27)
CO2: 21 mmol/L (ref 20–29)
CREATININE: 1.64 mg/dL — AB (ref 0.76–1.27)
Calcium: 9.2 mg/dL (ref 8.6–10.2)
Chloride: 101 mmol/L (ref 96–106)
GFR calc Af Amer: 43 mL/min/{1.73_m2} — ABNORMAL LOW (ref >59)
GFR calc non Af Amer: 37 mL/min/{1.73_m2} — ABNORMAL LOW (ref >59)
GLUCOSE: 181 mg/dL — AB (ref 65–99)
Potassium: 4.9 mmol/L (ref 3.5–5.2)
Sodium: 141 mmol/L (ref 134–144)

## 2017-05-19 ENCOUNTER — Ambulatory Visit (HOSPITAL_COMMUNITY): Payer: PPO | Admitting: Physical Therapy

## 2017-05-19 DIAGNOSIS — S81802S Unspecified open wound, left lower leg, sequela: Secondary | ICD-10-CM | POA: Diagnosis not present

## 2017-05-19 DIAGNOSIS — S81801S Unspecified open wound, right lower leg, sequela: Secondary | ICD-10-CM

## 2017-05-19 NOTE — Therapy (Signed)
[place], [place], [address] Phone: [phone]   Fax:  [phone]  Wound Care Therapy  Patient Details  Name: [name] MRN: [id number] Date of Birth: 1929-10-01 Referring Provider: Celene Squibb   Encounter Date: 05/19/2017      PT End of Session - 05/19/17 1535    Visit Number 3   Number of Visits 9   Date for PT Re-Evaluation 06/09/17   Authorization Type [employer]    Authorization Time Period 05/12/17 to 06/09/17   Authorization - Visit Number 3   Authorization - Number of Visits 10   PT Start Time 1440   PT Stop Time 1535   PT Time Calculation (min) 55 min   Activity Tolerance Patient tolerated treatment well   Behavior During Therapy [facility] for tasks assessed/performed      Past Medical History:  Diagnosis Date  . Allergic rhinitis   . Asthmatic bronchitis   . Atrial fibrillation ([place])   . Celiac disease   . Cellulitis   . Diverticulosis   . DM (diabetes mellitus) ([place])   . Fx. left wrist 1987  . Gastric polyps   . GERD (gastroesophageal reflux disease)   . History of pneumonia    w/pleural effusion  . Hypothyroidism   . Iron deficiency anemia   . Melanoma ([place])   . Neuropathy   . Prostate cancer ([place])   . REM sleep behavior disorder   . RLS (restless legs syndrome)   . Sleep apnea     Past Surgical History:  Procedure Laterality Date  . APPENDECTOMY    . CARPAL TUNNEL RELEASE    . CATARACT EXTRACTION  03/2011   bilateral   . CHOLECYSTECTOMY  2001  . COLONOSCOPY W/ BIOPSIES  04/27/2008   diverticulosis, duodenitis  . FOOT SURGERY    . HERNIA REPAIR  1994   bilateral  . KNEE SURGERY     x 2  . LUNG SURGERY  2001  . PROSTATECTOMY  2002  . UPPER GASTROINTESTINAL ENDOSCOPY  04/27/2008   celiac disease, gastric polyps    There were no vitals filed for this visit.                  Wound Therapy - 05/19/17 1528    Subjective Pt states dressings stayed  up much better this time with added tape.  Reports no pain.   Patient and Family Stated Goals wounds to heal    Date of Onset --  2 months ago   Prior Treatments antibiotic cream from MD    Pain Assessment No/denies pain   Evaluation and Treatment Procedures Explained to Patient/Family Yes   Evaluation and Treatment Procedures agreed to   Wound Properties Date First Assessed: 05/12/17 Wound Type: Venous stasis ulcer Location: Leg Location Orientation: Right;Anterior Wound Description (Comments): right anterior superior    Dressing Type Impregnated gauze (petrolatum);Compression wrap  xeroform and profore   Dressing Changed Changed   Dressing Status Clean;Dry;Intact   Site / Wound Assessment Clean;Dry;Granulation tissue  weeping wounds   % Wound base Red or Granulating 85%   % Wound base Yellow/Fibrinous Exudate 15%   Closure None   Drainage Amount Minimal  weeping wound   Drainage Description Serous   Treatment Cleansed;Debridement (Selective)   Wound Properties Date First Assessed: 05/12/17 Wound Type: Venous stasis ulcer Location: Leg Location Orientation: Right;Anterior Wound Description (Comments): right anterior lower  Present on Admission: Yes   Dressing  Type Impregnated gauze (petrolatum)  xeroform and profore   Dressing Changed Changed   Dressing Status Clean;Dry;Intact   Site / Wound Assessment Clean;Dry;Granulation tissue  weeping wound   % Wound base Red or Granulating 100%   Closure None   Drainage Amount Minimal  weeping wound   Drainage Description Serous   Treatment Cleansed;Debridement (Selective)   Wound Properties Date First Assessed: 05/12/17 Wound Type: Venous stasis ulcer Location: Leg Location Orientation: Left;Anterior Wound Description (Comments): left anterior superior    Dressing Type Impregnated gauze (petrolatum);Compression wrap  xeroform and profore   Dressing Changed Changed   Dressing Status Clean;Dry;Intact   Site / Wound Assessment  Clean;Dry;Granulation tissue  weeping wounds   % Wound base Red or Granulating 100%   Closure None   Drainage Amount Moderate  weeping wound   Drainage Description Serous   Treatment Cleansed;Debridement (Selective)   Wound Properties Date First Assessed: 05/12/17 Wound Type: Venous stasis ulcer Location: Leg Location Orientation: Left;Anterior Wound Description (Comments): left anterior middle    Dressing Type Impregnated gauze (petrolatum);Compression wrap  xeroform, ABD pad and profore   Dressing Changed Changed   Dressing Status Clean;Dry;Intact   Site / Wound Assessment Clean;Dry;Granulation tissue  weeping wound   % Wound base Red or Granulating 85%   % Wound base Yellow/Fibrinous Exudate 15%   Drainage Amount Moderate  weeping wound   Drainage Description Serous   Treatment Cleansed;Debridement (Selective)   Wound Properties Date First Assessed: 05/12/17 Wound Type: Venous stasis ulcer Location: Leg Location Orientation: Left;Anterior Wound Description (Comments): left anterior lower    Dressing Type Impregnated gauze (petrolatum);Compression wrap  xeroform, ABD and profore   Dressing Changed Changed   Dressing Status Clean;Dry;Intact   % Wound base Red or Granulating 95%   % Wound base Yellow/Fibrinous Exudate 5%   Closure None   Drainage Amount Moderate  weeping wound   Drainage Description Serous   Treatment Cleansed;Debridement (Selective)   Wound Properties Date First Assessed: 05/12/17 Wound Type: Venous stasis ulcer Location: Leg Location Orientation: Left;Anterior Wound Description (Comments): left anterior lateral    Dressing Type Impregnated gauze (petrolatum);Compression wrap  xeroform, ABD and profore   Dressing Changed Changed   Dressing Status Clean;Dry;Intact   Site / Wound Assessment Clean;Dry;Granulation tissue  xeroform, ABD and profore   % Wound base Red or Granulating 90%   % Wound base Yellow/Fibrinous Exudate 10%   Closure None   Drainage Amount  Moderate  weeping wound   Drainage Description Serous   Treatment Cleansed;Debridement (Selective)   Selective Debridement - Location anterior shin BLE   Selective Debridement - Tools Used Scalpel   Selective Debridement - Tissue Removed slough   Wound Therapy - Clinical Statement Dressings saturated on Lt LE.  Changed dressing to silver hydrofiber due to heavy drainage.  Debrided all wounds via scapel to remove thick layer on all wounds present on Lt LE.  Rt LE continues to look well with minimal drainage.  pt able to tolerate debrdiement well.  Re-sent order to MD to sign for compression hose.  continued with profore system to provide adequate compression.   Wound Therapy - Functional Problem List non-healing wounds    Factors Delaying/Impairing Wound Healing Polypharmacy;Diabetes Mellitus   Wound Therapy - Frequency --  2x/week   Wound Therapy - Current Recommendations PT   Wound Plan Continue with appropriate dressing and profore for edema control.  F/U on compression hose referral.    Dressing  silver hydrofiber,  ABD pad on Lt  wiht profore BLE                   PT Short Term Goals - 05/12/17 1230      PT SHORT TERM GOAL #1   Title Patient and wife to verbalize improtance of consitent compression and need to get new compression stockings every 6 months to avoid formation of more wounds    Time 2   Period Weeks   Status New   Target Date 05/26/17     PT SHORT TERM GOAL #2   Title All wounds to be 100% granulated and have decreased in size by at least 50% all measured parameters    Time 2   Period Weeks   Status New           PT Long Term Goals - 05/12/17 1231      PT LONG TERM GOAL #1   Title Patient to demonstrate that all wounds have healed to show resolution of condition    Time 4   Period Weeks   Status New     PT LONG TERM GOAL #2   Title Patient to be consistently using LRAD for mobility to reduce fall risk    Time 4   Period Weeks   Status New              Patient will benefit from skilled therapeutic intervention in order to improve the following deficits and impairments:     Visit Diagnosis: Open wound of left lower leg, sequela  Open wound of right lower leg, sequela     Problem List Patient Active Problem List   Diagnosis Date Noted  . Afib ([place]) 01/10/2017  . SOB (shortness of breath) 01/10/2017  . Acute diastolic CHF (congestive heart failure) ([place]) 01/10/2017  . Atrial fibrillation with RVR ([place]) 01/10/2017  . Claudication ([place]) 07/25/2016  . Paroxysmal atrial fibrillation ([place]) 07/25/2016  . Odynophagia 04/10/2014  . Allergic rhinitis 03/23/2014  . Chronic rhinitis 12/22/2013  . 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 Constipation 08/22/2011  . Obesity 09/20/2009  . G E R D 11/29/2007  . Celiac disease 11/29/2007   Teena Irani, PTA/CLT [phone]  Teena Irani 05/19/2017, 3:46 PM  [facility] [address] [place], [place], [address] Phone: [phone]   Fax:  [phone]  Name: [name] MRN: [id number] Date of Birth: 1929-07-14

## 2017-05-21 ENCOUNTER — Telehealth: Payer: Self-pay | Admitting: Cardiovascular Disease

## 2017-05-21 ENCOUNTER — Ambulatory Visit (HOSPITAL_COMMUNITY): Payer: PPO | Admitting: Physical Therapy

## 2017-05-21 DIAGNOSIS — R2681 Unsteadiness on feet: Secondary | ICD-10-CM

## 2017-05-21 DIAGNOSIS — S81802S Unspecified open wound, left lower leg, sequela: Secondary | ICD-10-CM

## 2017-05-21 DIAGNOSIS — S81801S Unspecified open wound, right lower leg, sequela: Secondary | ICD-10-CM

## 2017-05-21 DIAGNOSIS — R2689 Other abnormalities of gait and mobility: Secondary | ICD-10-CM

## 2017-05-21 DIAGNOSIS — Z9181 History of falling: Secondary | ICD-10-CM

## 2017-05-21 NOTE — Telephone Encounter (Signed)
Notes recorded by Lorretta Harp, MD on 05/16/2017 at 2:27 PM EDT Renal fxn still moderately impaired but not signif changed

## 2017-05-21 NOTE — Therapy (Signed)
[place], [place], [address] Phone: [phone]   Fax:  [phone]  Wound Care Therapy  Patient Details  Name: [name] MRN: [id number] Date of Birth: Dec 04, 1928 Referring Provider: Celene Squibb   Encounter Date: 05/21/2017    Past Medical History:  Diagnosis Date  . Allergic rhinitis   . Asthmatic bronchitis   . Atrial fibrillation ([place])   . Celiac disease   . Cellulitis   . Diverticulosis   . DM (diabetes mellitus) ([place])   . Fx. left wrist 1987  . Gastric polyps   . GERD (gastroesophageal reflux disease)   . History of pneumonia    w/pleural effusion  . Hypothyroidism   . Iron deficiency anemia   . Melanoma ([place])   . Neuropathy   . Prostate cancer ([place])   . REM sleep behavior disorder   . RLS (restless legs syndrome)   . Sleep apnea     Past Surgical History:  Procedure Laterality Date  . APPENDECTOMY    . CARPAL TUNNEL RELEASE    . CATARACT EXTRACTION  03/2011   bilateral   . CHOLECYSTECTOMY  2001  . COLONOSCOPY W/ BIOPSIES  04/27/2008   diverticulosis, duodenitis  . FOOT SURGERY    . HERNIA REPAIR  1994   bilateral  . KNEE SURGERY     x 2  . LUNG SURGERY  2001  . PROSTATECTOMY  2002  . UPPER GASTROINTESTINAL ENDOSCOPY  04/27/2008   celiac disease, gastric polyps    There were no vitals filed for this visit.                  Wound Therapy - 05/21/17 1554    Subjective Pt reports comfort and no pain    Patient and Family Stated Goals wounds to heal    Date of Onset --  2 months ago   Prior Treatments antibiotic cream from MD    Pain Assessment No/denies pain   Evaluation and Treatment Procedures Explained to Patient/Family Yes   Evaluation and Treatment Procedures agreed to   Wound Properties Date First Assessed: 05/12/17 Wound Type: Venous stasis ulcer Location: Leg Location Orientation: Right;Anterior Wound Description (Comments): right anterior superior    Dressing Type Impregnated gauze (petrolatum);Compression wrap  xeroform and profore   Dressing Changed Changed   Dressing Status Clean;Dry;Intact   Site / Wound Assessment Clean;Dry;Granulation tissue  weeping wounds   % Wound base Red or Granulating 100%   % Wound base Yellow/Fibrinous Exudate 0%   Wound Length (cm) 1 cm   Wound Width (cm) 0.5 cm   Closure None   Drainage Amount Scant  weeping wound   Drainage Description Serous   Treatment Cleansed;Debridement (Selective)   Wound Properties Date First Assessed: 05/12/17 Wound Type: Venous stasis ulcer Location: Leg Location Orientation: Right;Anterior Wound Description (Comments): right anterior lower  Present on Admission: Yes   Dressing Type Impregnated gauze (petrolatum)  xeroform and profore   Dressing Changed Changed   Dressing Status Clean;Dry;Intact   Site / Wound Assessment Clean;Dry;Granulation tissue  weeping wound   % Wound base Red or Granulating 100%   Wound Length (cm) 0.2 cm   Wound Width (cm) 0.2 cm   Closure None   Drainage Amount None  weeping wound   Drainage Description --   Treatment Cleansed;Debridement (Selective)   Wound Properties Date First Assessed: 05/12/17 Wound Type: Venous stasis ulcer Location: Leg Location Orientation: Left;Anterior Wound Description (Comments): left  anterior superior    Dressing Type Impregnated gauze (petrolatum);Compression wrap  xeroform and profore   Dressing Changed Changed   Dressing Status Clean;Dry;Intact   Site / Wound Assessment Clean;Dry;Granulation tissue  weeping wounds   % Wound base Red or Granulating 100%   Wound Length (cm) 2.5 cm   Wound Width (cm) 1 cm   Closure None   Drainage Amount Minimal  weeping wound   Drainage Description Serous   Treatment Cleansed;Debridement (Selective)   Wound Properties Date First Assessed: 05/12/17 Wound Type: Venous stasis ulcer Location: Leg Location Orientation: Left;Anterior Wound Description (Comments): left  anterior middle    Dressing Type Impregnated gauze (petrolatum);Compression wrap  xeroform, ABD pad and profore   Dressing Changed Changed   Dressing Status Clean;Dry;Intact   Site / Wound Assessment Clean;Dry;Granulation tissue  weeping wound   % Wound base Red or Granulating 85%   % Wound base Yellow/Fibrinous Exudate 15%   Wound Length (cm) 2.5 cm   Wound Width (cm) 1 cm   Drainage Amount Minimal  weeping wound   Drainage Description Serous   Treatment Cleansed;Debridement (Selective)   Wound Properties Date First Assessed: 05/12/17 Wound Type: Venous stasis ulcer Location: Leg Location Orientation: Left;Anterior Wound Description (Comments): left anterior lower    Dressing Type Impregnated gauze (petrolatum);Compression wrap  xeroform, ABD and profore   Dressing Status Clean;Dry;Intact   % Wound base Red or Granulating 95%   % Wound base Yellow/Fibrinous Exudate 5%   Wound Length (cm) 2 cm   Wound Width (cm) 2 cm   Closure None   Drainage Amount Minimal  weeping wound   Drainage Description Serous   Treatment Cleansed;Debridement (Selective)   Wound Properties Date First Assessed: 05/12/17 Wound Type: Venous stasis ulcer Location: Leg Location Orientation: Left;Anterior Wound Description (Comments): left anterior lateral    Dressing Type Impregnated gauze (petrolatum);Compression wrap  xeroform, ABD and profore   Dressing Status Clean;Dry;Intact   Site / Wound Assessment Clean;Dry;Granulation tissue  xeroform, ABD and profore   % Wound base Red or Granulating 90%   % Wound base Yellow/Fibrinous Exudate 10%   Wound Length (cm) 2 cm   Wound Width (cm) 2 cm   Closure None   Drainage Amount Minimal  weeping wound   Drainage Description Serous   Treatment Cleansed;Debridement (Selective)   Selective Debridement - Location anterior shin BLE   Selective Debridement - Tools Used Scalpel   Selective Debridement - Tissue Removed slough   Wound Therapy - Clinical Statement Much  improved woundbeds with now 100% granulation in all except most superior on Lt.  Additional loss in fluid also noted in LE's.  Still have not received signed order for compression.  Anticipate D/C soon if continues with course of improvment.    Wound Therapy - Functional Problem List non-healing wounds    Factors Delaying/Impairing Wound Healing Polypharmacy;Diabetes Mellitus   Wound Therapy - Frequency --  2x/week   Wound Therapy - Current Recommendations PT   Wound Plan Continue with appropriate dressing and profore for edema control.  F/U on compression hose referral.    Dressing  silver hydrofiber,  ABD pad on Lt wiht profore BLE                   PT Short Term Goals - 05/12/17 1230      PT SHORT TERM GOAL #1   Title Patient and wife to verbalize improtance of consitent compression and need to get new compression stockings every 6 months to  avoid formation of more wounds    Time 2   Period Weeks   Status New   Target Date 05/26/17     PT SHORT TERM GOAL #2   Title All wounds to be 100% granulated and have decreased in size by at least 50% all measured parameters    Time 2   Period Weeks   Status New           PT Long Term Goals - 05/12/17 1231      PT LONG TERM GOAL #1   Title Patient to demonstrate that all wounds have healed to show resolution of condition    Time 4   Period Weeks   Status New     PT LONG TERM GOAL #2   Title Patient to be consistently using LRAD for mobility to reduce fall risk    Time 4   Period Weeks   Status New             Patient will benefit from skilled therapeutic intervention in order to improve the following deficits and impairments:     Visit Diagnosis: Open wound of left lower leg, sequela  Open wound of right lower leg, sequela  Unsteadiness on feet  History of falling  Other abnormalities of gait and mobility     Problem List Patient Active Problem List   Diagnosis Date Noted  . Afib ([place])  01/10/2017  . SOB (shortness of breath) 01/10/2017  . Acute diastolic CHF (congestive heart failure) ([place]) 01/10/2017  . Atrial fibrillation with RVR ([place]) 01/10/2017  . Claudication ([place]) 07/25/2016  . Paroxysmal atrial fibrillation ([place]) 07/25/2016  . Odynophagia 04/10/2014  . Allergic rhinitis 03/23/2014  . Chronic rhinitis 12/22/2013  . 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 Constipation 08/22/2011  . Obesity 09/20/2009  . G E R D 11/29/2007  . Celiac disease 11/29/2007   Teena Irani, PTA/CLT [phone]  Teena Irani 05/21/2017, 4:49 PM  [facility] [address] [place], [place], [address] Phone: [phone]   Fax:  [phone]  Name: BRIAR WITHERSPOON MRN: [id number] Date of Birth: [birth year]

## 2017-05-21 NOTE — Telephone Encounter (Signed)
New message    Pt is calling for blood work. States he has been waiting for a call back

## 2017-05-22 NOTE — Telephone Encounter (Signed)
No VM again later

## 2017-05-25 ENCOUNTER — Encounter: Payer: Self-pay | Admitting: Cardiovascular Disease

## 2017-05-25 NOTE — Telephone Encounter (Signed)
Spoke with pt, aware of lab results. Copy mailed to patient at his request.

## 2017-05-26 ENCOUNTER — Ambulatory Visit (HOSPITAL_COMMUNITY): Payer: PPO | Admitting: Physical Therapy

## 2017-05-26 DIAGNOSIS — S81801S Unspecified open wound, right lower leg, sequela: Secondary | ICD-10-CM

## 2017-05-26 DIAGNOSIS — R2681 Unsteadiness on feet: Secondary | ICD-10-CM

## 2017-05-26 DIAGNOSIS — S81802S Unspecified open wound, left lower leg, sequela: Secondary | ICD-10-CM | POA: Diagnosis not present

## 2017-05-26 NOTE — Therapy (Signed)
[place], [place], [address] Phone: [phone]   Fax:  [phone]  Wound Care Therapy  Patient Details  Name: [name] MRN: [id number] Date of Birth: 1929-09-09 Referring Provider: Celene Squibb   Encounter Date: 05/26/2017      PT End of Session - 05/26/17 1541    Visit Number 4   Number of Visits 9   Date for PT Re-Evaluation 06/09/17   Authorization Type [employer]    Authorization Time Period 05/12/17 to 06/09/17   Authorization - Visit Number 4   Authorization - Number of Visits 10   PT Start Time 2119   PT Stop Time 1444   PT Time Calculation (min) 56 min   Activity Tolerance Patient tolerated treatment well   Behavior During Therapy [facility] for tasks assessed/performed      Past Medical History:  Diagnosis Date  . Allergic rhinitis   . Asthmatic bronchitis   . Atrial fibrillation ([place])   . Celiac disease   . Cellulitis   . Diverticulosis   . DM (diabetes mellitus) ([place])   . Fx. left wrist 1987  . Gastric polyps   . GERD (gastroesophageal reflux disease)   . History of pneumonia    w/pleural effusion  . Hypothyroidism   . Iron deficiency anemia   . Melanoma ([place])   . Neuropathy   . Prostate cancer ([place])   . REM sleep behavior disorder   . RLS (restless legs syndrome)   . Sleep apnea     Past Surgical History:  Procedure Laterality Date  . APPENDECTOMY    . CARPAL TUNNEL RELEASE    . CATARACT EXTRACTION  03/2011   bilateral   . CHOLECYSTECTOMY  2001  . COLONOSCOPY W/ BIOPSIES  04/27/2008   diverticulosis, duodenitis  . FOOT SURGERY    . HERNIA REPAIR  1994   bilateral  . KNEE SURGERY     x 2  . LUNG SURGERY  2001  . PROSTATECTOMY  2002  . UPPER GASTROINTESTINAL ENDOSCOPY  04/27/2008   celiac disease, gastric polyps    There were no vitals filed for this visit.                  Wound Therapy - 05/26/17 1513    Subjective Pt states he is doing well.   Reports some "stinging" and warmth in his LE that he has never felt before.  Explained this is prob improved circulation and nerve sensitivity.    Patient and Family Stated Goals wounds to heal    Date of Onset --  2 months ago   Prior Treatments antibiotic cream from MD    Pain Assessment No/denies pain   Evaluation and Treatment Procedures Explained to Patient/Family Yes   Evaluation and Treatment Procedures agreed to   Wound Properties Date First Assessed: 05/12/17 Wound Type: Venous stasis ulcer Location: Leg Location Orientation: Right;Anterior Wound Description (Comments): right anterior superior    Dressing Type Impregnated gauze (petrolatum);Compression wrap  xeroform and profore   Dressing Changed Changed   Dressing Status Clean;Dry;Intact   Site / Wound Assessment Clean;Dry;Granulation tissue  weeping wounds   % Wound base Red or Granulating 100%   % Wound base Yellow/Fibrinous Exudate 0%   Wound Length (cm) 0.4 cm   Wound Width (cm) 0.2 cm   Closure None   Drainage Amount Scant  weeping wound   Drainage Description Serous   Treatment Cleansed;Debridement (Selective)  Wound Properties Date First Assessed: 05/12/17 Wound Type: Venous stasis ulcer Location: Leg Location Orientation: Right;Anterior Wound Description (Comments): right anterior lower  Present on Admission: Yes   Dressing Type --  xeroform and profore   Dressing Status --   Site / Wound Assessment --  weeping wound   % Wound base Red or Granulating --   Closure --   Drainage Amount --  weeping wound   Wound Properties Date First Assessed: 05/12/17 Wound Type: Venous stasis ulcer Location: Leg Location Orientation: Left;Anterior Wound Description (Comments): left anterior superior    Dressing Type Impregnated gauze (petrolatum);Compression wrap  xeroform and profore   Dressing Changed Changed   Dressing Status Clean;Dry;Intact   Site / Wound Assessment Clean;Dry;Granulation tissue  weeping wounds   %  Wound base Red or Granulating 100%   Wound Length (cm) 2 cm   Wound Width (cm) 1 cm   Closure None   Drainage Amount Scant  weeping wound   Drainage Description Serous   Treatment Cleansed;Debridement (Selective)   Wound Properties Date First Assessed: 05/12/17 Wound Type: Venous stasis ulcer Location: Leg Location Orientation: Left;Anterior Wound Description (Comments): left anterior middle    Dressing Type Impregnated gauze (petrolatum);Compression wrap  xeroform, ABD pad and profore   Dressing Changed Changed   Dressing Status Clean;Dry;Intact   Site / Wound Assessment Clean;Dry;Granulation tissue  weeping wound   % Wound base Red or Granulating 100%   % Wound base Yellow/Fibrinous Exudate 0%   Wound Length (cm) 2 cm   Wound Width (cm) 1 cm   Drainage Amount Scant  weeping wound   Drainage Description Serous   Treatment Cleansed;Debridement (Selective)   Wound Properties Date First Assessed: 05/12/17 Wound Type: Venous stasis ulcer Location: Leg Location Orientation: Left;Anterior Wound Description (Comments): left anterior lower    Dressing Type Impregnated gauze (petrolatum);Compression wrap  xeroform, ABD and profore   Dressing Changed Changed   Dressing Status Clean;Dry;Intact   % Wound base Red or Granulating 100%   % Wound base Yellow/Fibrinous Exudate 0%   Wound Length (cm) 1 cm   Wound Width (cm) 1 cm   Closure None   Drainage Amount Scant  weeping wound   Drainage Description Serous   Treatment Cleansed;Debridement (Selective)   Wound Properties Date First Assessed: 05/12/17 Wound Type: Venous stasis ulcer Location: Leg Location Orientation: Left;Anterior Wound Description (Comments): left anterior lateral    Dressing Type Impregnated gauze (petrolatum);Compression wrap  xeroform, ABD and profore   Dressing Changed Changed   Dressing Status Clean;Dry;Intact   Site / Wound Assessment Clean;Dry;Granulation tissue  xeroform, ABD and profore   % Wound base Red or  Granulating 100%   % Wound base Yellow/Fibrinous Exudate 0%   Wound Length (cm) 1.8 cm   Wound Width (cm) 1.5 cm   Closure None   Drainage Amount Scant  weeping wound   Drainage Description Serous   Treatment Cleansed;Debridement (Selective)   Selective Debridement - Location anterior shin BLE   Selective Debridement - Tools Used Scalpel   Selective Debridement - Tissue Removed slough   Wound Therapy - Clinical Statement Continued improvement with 100% granulation in all wounds.  Rt most distal wound is now healed and all wounds are decreased in size.  Changed all dressings to xeroform and continued with profore.  Now with scant drainage.  Still have not received signed order for compression stockings.  Order resent to MD.    Wound Therapy - Functional Problem List non-healing wounds  Factors Delaying/Impairing Wound Healing Polypharmacy;Diabetes Mellitus   Wound Therapy - Frequency --  2x/week   Wound Therapy - Current Recommendations PT   Wound Plan Continue with appropriate dressing and profore for edema control.  To give order for hose at discharge for pateint to obtain at [facility].  Also give information on [employer].    Dressing  xeroform,profore BLE                   PT Short Term Goals - 05/12/17 1230      PT SHORT TERM GOAL #1   Title Patient and wife to verbalize improtance of consitent compression and need to get new compression stockings every 6 months to avoid formation of more wounds    Time 2   Period Weeks   Status New   Target Date 05/26/17     PT SHORT TERM GOAL #2   Title All wounds to be 100% granulated and have decreased in size by at least 50% all measured parameters    Time 2   Period Weeks   Status New           PT Long Term Goals - 05/12/17 1231      PT LONG TERM GOAL #1   Title Patient to demonstrate that all wounds have healed to show resolution of condition    Time 4   Period Weeks   Status New     PT LONG TERM  GOAL #2   Title Patient to be consistently using LRAD for mobility to reduce fall risk    Time 4   Period Weeks   Status New             Patient will benefit from skilled therapeutic intervention in order to improve the following deficits and impairments:     Visit Diagnosis: Open wound of left lower leg, sequela  Open wound of right lower leg, sequela  Unsteadiness on feet     Problem List Patient Active Problem List   Diagnosis Date Noted  . Afib ([place]) 01/10/2017  . SOB (shortness of breath) 01/10/2017  . Acute diastolic CHF (congestive heart failure) ([place]) 01/10/2017  . Atrial fibrillation with RVR ([place]) 01/10/2017  . Claudication ([place]) 07/25/2016  . Paroxysmal atrial fibrillation ([place]) 07/25/2016  . Odynophagia 04/10/2014  . Allergic rhinitis 03/23/2014  . Chronic rhinitis 12/22/2013  . 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 Constipation 08/22/2011  . Obesity 09/20/2009  . G E R D 11/29/2007  . Celiac disease 11/29/2007   Teena Irani, PTA/CLT [phone]  Teena Irani 05/26/2017, 3:42 PM  [facility] [address] [place], [place], [address] Phone: [phone]   Fax:  [phone]  Name: YORK VALLIANT MRN: [id number] Date of Birth: [birth year]

## 2017-05-28 ENCOUNTER — Ambulatory Visit (HOSPITAL_COMMUNITY): Payer: PPO | Admitting: Physical Therapy

## 2017-05-28 DIAGNOSIS — R2681 Unsteadiness on feet: Secondary | ICD-10-CM

## 2017-05-28 DIAGNOSIS — I10 Essential (primary) hypertension: Secondary | ICD-10-CM | POA: Diagnosis not present

## 2017-05-28 DIAGNOSIS — R2689 Other abnormalities of gait and mobility: Secondary | ICD-10-CM

## 2017-05-28 DIAGNOSIS — Z9181 History of falling: Secondary | ICD-10-CM

## 2017-05-28 DIAGNOSIS — S81802S Unspecified open wound, left lower leg, sequela: Secondary | ICD-10-CM

## 2017-05-28 DIAGNOSIS — E1142 Type 2 diabetes mellitus with diabetic polyneuropathy: Secondary | ICD-10-CM | POA: Diagnosis not present

## 2017-05-28 DIAGNOSIS — S81801S Unspecified open wound, right lower leg, sequela: Secondary | ICD-10-CM

## 2017-05-28 DIAGNOSIS — D509 Iron deficiency anemia, unspecified: Secondary | ICD-10-CM | POA: Diagnosis not present

## 2017-05-28 NOTE — Therapy (Signed)
[name], [place], [address] Phone: [phone]   Fax:  [phone]  Wound Care Therapy  Patient Details  Name: [name] MRN: [id number] Date of Birth: 23-Nov-1928 Referring Provider: Celene Squibb   Encounter Date: 05/28/2017      PT End of Session - 05/28/17 1548    Visit Number 5   Number of Visits 9   Date for PT Re-Evaluation 06/09/17   Authorization Type [employer]    Authorization Time Period 05/12/17 to 06/09/17   Authorization - Visit Number 5   Authorization - Number of Visits 10   PT Start Time 0109   PT Stop Time 1525   PT Time Calculation (min) 50 min   Activity Tolerance Patient tolerated treatment well   Behavior During Therapy [facility] for tasks assessed/performed      Past Medical History:  Diagnosis Date  . Allergic rhinitis   . Asthmatic bronchitis   . Atrial fibrillation ([place])   . Celiac disease   . Cellulitis   . Diverticulosis   . DM (diabetes mellitus) ([place])   . Fx. left wrist 1987  . Gastric polyps   . GERD (gastroesophageal reflux disease)   . History of pneumonia    w/pleural effusion  . Hypothyroidism   . Iron deficiency anemia   . Melanoma ([place])   . Neuropathy   . Prostate cancer ([place])   . REM sleep behavior disorder   . RLS (restless legs syndrome)   . Sleep apnea     Past Surgical History:  Procedure Laterality Date  . APPENDECTOMY    . CARPAL TUNNEL RELEASE    . CATARACT EXTRACTION  03/2011   bilateral   . CHOLECYSTECTOMY  2001  . COLONOSCOPY W/ BIOPSIES  04/27/2008   diverticulosis, duodenitis  . FOOT SURGERY    . HERNIA REPAIR  1994   bilateral  . KNEE SURGERY     x 2  . LUNG SURGERY  2001  . PROSTATECTOMY  2002  . UPPER GASTROINTESTINAL ENDOSCOPY  04/27/2008   celiac disease, gastric polyps    There were no vitals filed for this visit.                  Wound Therapy - 05/28/17 1529    Subjective Pt states he had some  discomfort at the top of his Lt anterior LE but did not last long.    Patient and Family Stated Goals wounds to heal    Date of Onset --  2 months ago   Prior Treatments antibiotic cream from MD    Pain Assessment No/denies pain   Evaluation and Treatment Procedures Explained to Patient/Family Yes   Evaluation and Treatment Procedures agreed to   Wound Properties Date First Assessed: 05/12/17 Wound Type: Venous stasis ulcer Location: Leg Location Orientation: Right;Anterior Wound Description (Comments): right anterior superior  Final Assessment Date: 05/28/17 Final Assessment Time: 1500   Drainage Amount --   Wound Properties Date First Assessed: 05/12/17 Wound Type: Venous stasis ulcer Location: Leg Location Orientation: Right;Anterior Wound Description (Comments): right anterior lower  Present on Admission: Yes Final Assessment Date: 05/28/17 Final Assessment Time: 1500   Dressing Type --   Site / Wound Assessment --   Drainage Amount --   Wound Properties Date First Assessed: 05/12/17 Wound Type: Venous stasis ulcer Location: Leg Location Orientation: Left;Anterior Wound Description (Comments): left anterior superior    Dressing Type Impregnated gauze (petrolatum);Compression wrap  xeroform and profore   Dressing Changed Changed   Dressing Status Clean;Dry;Intact   Site / Wound Assessment Clean;Dry;Granulation tissue  weeping wounds   % Wound base Red or Granulating 100%   Closure None   Drainage Amount Scant  weeping wound   Drainage Description Serous   Treatment Cleansed;Debridement (Selective)   Wound Properties Date First Assessed: 05/12/17 Wound Type: Venous stasis ulcer Location: Leg Location Orientation: Left;Anterior Wound Description (Comments): left anterior middle    Dressing Type Impregnated gauze (petrolatum);Compression wrap  xeroform, ABD pad and profore   Dressing Changed Changed   Dressing Status Clean;Dry;Intact   Site / Wound Assessment Clean;Dry;Granulation  tissue  weeping wound   % Wound base Red or Granulating 100%   % Wound base Yellow/Fibrinous Exudate 0%   Drainage Amount Scant  weeping wound   Drainage Description Serous   Treatment Cleansed;Debridement (Selective)   Wound Properties Date First Assessed: 05/12/17 Wound Type: Venous stasis ulcer Location: Leg Location Orientation: Left;Anterior Wound Description (Comments): left anterior lower    Dressing Type Impregnated gauze (petrolatum);Compression wrap  xeroform, ABD and profore   Dressing Changed Changed   Dressing Status Clean;Dry;Intact   % Wound base Red or Granulating 100%   % Wound base Yellow/Fibrinous Exudate 0%   Closure None   Drainage Amount Scant  weeping wound   Drainage Description Serous   Treatment Cleansed;Debridement (Selective)   Wound Properties Date First Assessed: 05/12/17 Wound Type: Venous stasis ulcer Location: Leg Location Orientation: Left;Anterior Wound Description (Comments): left anterior lateral    Dressing Type Impregnated gauze (petrolatum);Compression wrap  xeroform, ABD and profore   Dressing Changed Changed   Dressing Status Clean;Dry;Intact   Site / Wound Assessment Clean;Dry;Granulation tissue  xeroform, ABD and profore   % Wound base Red or Granulating 100%   % Wound base Yellow/Fibrinous Exudate 0%   Closure None   Drainage Amount Scant  weeping wound   Drainage Description Serous   Treatment Cleansed;Debridement (Selective)   Selective Debridement - Location Lt anterior shin   Selective Debridement - Tools Used Forceps   Selective Debridement - Tissue Removed slough   Wound Therapy - Clinical Statement Rt LE now completely healed without wounds and ready for stocking.  Lt LE continues to improve and diminish in size.  Removed slough from perimeter of wounds on Lt LE , cleansed and moisturized both LE's well prior to redressing with profore system.  Pt given signed order for compression garments and instructed to call Tan to schedule  and appt at [facility] (his choice).  Pt is to remove the Rt wrap at this point to be fitted for garment and keep Lt LE bandage in place.  Pt verbalized understanding of this.    Wound Therapy - Functional Problem List non-healing wounds    Factors Delaying/Impairing Wound Healing Polypharmacy;Diabetes Mellitus   Wound Therapy - Frequency --  2x/week   Wound Therapy - Current Recommendations PT   Wound Plan Continue with woundcare to Lt LE and appropriate compression bandage.  Ensure patient has compression bandage in place on Lt LE next session.    Dressing  xeroform,profore BLE                   PT Short Term Goals - 05/12/17 1230      PT SHORT TERM GOAL #1   Title Patient and wife to verbalize improtance of consitent compression and need to get new compression stockings every 6 months to avoid formation of more wounds  Time 2   Period Weeks   Status New   Target Date 05/26/17     PT SHORT TERM GOAL #2   Title All wounds to be 100% granulated and have decreased in size by at least 50% all measured parameters    Time 2   Period Weeks   Status New           PT Long Term Goals - 05/12/17 1231      PT LONG TERM GOAL #1   Title Patient to demonstrate that all wounds have healed to show resolution of condition    Time 4   Period Weeks   Status New     PT LONG TERM GOAL #2   Title Patient to be consistently using LRAD for mobility to reduce fall risk    Time 4   Period Weeks   Status New             Patient will benefit from skilled therapeutic intervention in order to improve the following deficits and impairments:     Visit Diagnosis: Open wound of left lower leg, sequela  Open wound of right lower leg, sequela  Unsteadiness on feet  History of falling  Other abnormalities of gait and mobility     Problem List Patient Active Problem List   Diagnosis Date Noted  . Afib ([place]) 01/10/2017  . SOB (shortness of breath) 01/10/2017  .  Acute diastolic CHF (congestive heart failure) ([place]) 01/10/2017  . Atrial fibrillation with RVR ([place]) 01/10/2017  . Claudication ([place]) 07/25/2016  . Paroxysmal atrial fibrillation ([place]) 07/25/2016  . Odynophagia 04/10/2014  . Allergic rhinitis 03/23/2014  . Chronic rhinitis 12/22/2013  . 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 Constipation 08/22/2011  . Obesity 09/20/2009  . G E R D 11/29/2007  . Celiac disease 11/29/2007   Teena Irani, PTA/CLT [phone]  Teena Irani 05/28/2017, 3:50 PM  [facility] [address] [place], [place], [address] Phone: [phone]   Fax:  [phone]  Name: RIC ROSENBERG MRN: [id number] Date of Birth: Feb 15, 1929

## 2017-06-01 DIAGNOSIS — I480 Paroxysmal atrial fibrillation: Secondary | ICD-10-CM | POA: Diagnosis not present

## 2017-06-01 DIAGNOSIS — E782 Mixed hyperlipidemia: Secondary | ICD-10-CM | POA: Diagnosis not present

## 2017-06-01 DIAGNOSIS — K900 Celiac disease: Secondary | ICD-10-CM | POA: Diagnosis not present

## 2017-06-01 DIAGNOSIS — D509 Iron deficiency anemia, unspecified: Secondary | ICD-10-CM | POA: Diagnosis not present

## 2017-06-01 DIAGNOSIS — K5909 Other constipation: Secondary | ICD-10-CM | POA: Diagnosis not present

## 2017-06-01 DIAGNOSIS — L97909 Non-pressure chronic ulcer of unspecified part of unspecified lower leg with unspecified severity: Secondary | ICD-10-CM | POA: Diagnosis not present

## 2017-06-01 DIAGNOSIS — R944 Abnormal results of kidney function studies: Secondary | ICD-10-CM | POA: Diagnosis not present

## 2017-06-01 DIAGNOSIS — I5031 Acute diastolic (congestive) heart failure: Secondary | ICD-10-CM | POA: Diagnosis not present

## 2017-06-01 DIAGNOSIS — E1142 Type 2 diabetes mellitus with diabetic polyneuropathy: Secondary | ICD-10-CM | POA: Diagnosis not present

## 2017-06-01 DIAGNOSIS — E875 Hyperkalemia: Secondary | ICD-10-CM | POA: Diagnosis not present

## 2017-06-01 DIAGNOSIS — Z6832 Body mass index (BMI) 32.0-32.9, adult: Secondary | ICD-10-CM | POA: Diagnosis not present

## 2017-06-01 DIAGNOSIS — I10 Essential (primary) hypertension: Secondary | ICD-10-CM | POA: Diagnosis not present

## 2017-06-02 ENCOUNTER — Ambulatory Visit (HOSPITAL_COMMUNITY): Payer: PPO | Admitting: Physical Therapy

## 2017-06-02 DIAGNOSIS — I890 Lymphedema, not elsewhere classified: Secondary | ICD-10-CM | POA: Diagnosis not present

## 2017-06-02 DIAGNOSIS — R2681 Unsteadiness on feet: Secondary | ICD-10-CM

## 2017-06-02 DIAGNOSIS — S81801S Unspecified open wound, right lower leg, sequela: Secondary | ICD-10-CM

## 2017-06-02 DIAGNOSIS — S81802S Unspecified open wound, left lower leg, sequela: Secondary | ICD-10-CM | POA: Diagnosis not present

## 2017-06-02 DIAGNOSIS — Z9181 History of falling: Secondary | ICD-10-CM

## 2017-06-02 DIAGNOSIS — R609 Edema, unspecified: Secondary | ICD-10-CM | POA: Diagnosis not present

## 2017-06-02 NOTE — Therapy (Signed)
[facility] [place], [place], [address] Phone: [phone]   Fax:  [phone]  Wound Care Therapy  Patient Details  Name: [name] MRN: [id number] Date of Birth: [birth year] Referring Provider: Celene Squibb   Encounter Date: 06/02/2017      PT End of Session - 06/02/17 1637    Visit Number 6   Number of Visits 9   Date for PT Re-Evaluation 06/09/17   Authorization Type [employer]    Authorization Time Period 05/12/17 to 06/09/17   Authorization - Visit Number 6   Authorization - Number of Visits 10   PT Start Time 3329   PT Stop Time 1400   PT Time Calculation (min) 45 min   Activity Tolerance Patient tolerated treatment well   Behavior During Therapy [facility] for tasks assessed/performed      Past Medical History:  Diagnosis Date  . Allergic rhinitis   . Asthmatic bronchitis   . Atrial fibrillation ([place])   . Celiac disease   . Cellulitis   . Diverticulosis   . DM (diabetes mellitus) ([place])   . Fx. left wrist 1987  . Gastric polyps   . GERD (gastroesophageal reflux disease)   . History of pneumonia    w/pleural effusion  . Hypothyroidism   . Iron deficiency anemia   . Melanoma ([place])   . Neuropathy   . Prostate cancer ([place])   . REM sleep behavior disorder   . RLS (restless legs syndrome)   . Sleep apnea     Past Surgical History:  Procedure Laterality Date  . APPENDECTOMY    . CARPAL TUNNEL RELEASE    . CATARACT EXTRACTION  03/2011   bilateral   . CHOLECYSTECTOMY  2001  . COLONOSCOPY W/ BIOPSIES  04/27/2008   diverticulosis, duodenitis  . FOOT SURGERY    . HERNIA REPAIR  1994   bilateral  . KNEE SURGERY     x 2  . LUNG SURGERY  2001  . PROSTATECTOMY  2002  . UPPER GASTROINTESTINAL ENDOSCOPY  04/27/2008   celiac disease, gastric polyps    There were no vitals filed for this visit.                  Wound Therapy - 06/02/17 1620    Subjective Pt states he had some  discomfort at the top of his Lt anterior LE but did not last long.    Patient and Family Stated Goals wounds to heal    Date of Onset --  2 months ago   Prior Treatments antibiotic cream from MD    Pain Assessment No/denies pain   Evaluation and Treatment Procedures Explained to Patient/Family Yes   Evaluation and Treatment Procedures agreed to   Wound Properties Date First Assessed: 05/12/17 Wound Type: Venous stasis ulcer Location: Leg Location Orientation: Left;Anterior Wound Description (Comments): left anterior, all wounds now one big wound   Dressing Type Impregnated gauze (petrolatum);Compression wrap  xeroform and profore   Dressing Changed Changed   Dressing Status Clean;Dry;Intact   Site / Wound Assessment Clean;Dry;Granulation tissue  weeping wounds   % Wound base Red or Granulating 100%   Closure None   Drainage Amount Scant  weeping wound   Drainage Description Serous   Treatment Cleansed;Debridement (Selective)   Wound Properties Date First Assessed: 05/12/17 Wound Type: Venous stasis ulcer Location: Leg Location Orientation: Left;Anterior Wound Description (Comments): left anterior middle  Final Assessment Date: 06/02/17   Dressing  Type --   Dressing Status --   Site / Wound Assessment --   % Wound base Red or Granulating --   % Wound base Yellow/Fibrinous Exudate --   Drainage Amount --   Drainage Description --   Wound Properties Date First Assessed: 05/12/17 Wound Type: Venous stasis ulcer Location: Leg Location Orientation: Left;Anterior Wound Description (Comments): left anterior lower  Final Assessment Date: 06/02/17   Dressing Type --   Dressing Status --   % Wound base Red or Granulating --   % Wound base Yellow/Fibrinous Exudate --   Closure --   Drainage Amount --   Drainage Description --   Wound Properties Date First Assessed: 05/12/17 Wound Type: Venous stasis ulcer Location: Leg Location Orientation: Left;Anterior Wound Description (Comments): left  anterior lateral  Final Assessment Date: 06/02/17   Dressing Type --   Dressing Status --   Site / Wound Assessment --   % Wound base Red or Granulating --   % Wound base Yellow/Fibrinous Exudate --   Closure --   Drainage Amount --   Drainage Description --   Selective Debridement - Location Lt anterior shin   Selective Debridement - Tools Used Forceps   Selective Debridement - Tissue Removed slough   Wound Therapy - Clinical Statement Pt with compression stocking in place on Rt LE as received from [place] this morning.  Pt reports overall comfort and assurance that wife will be able to don and doff garment.  All wounds on Lt LE are now one big wound with measurements of 11cmX5cm.  wound is shallow without depth.  continued with xeroform on wounds to provide adequate healing environment.     Wound Therapy - Functional Problem List non-healing wounds    Factors Delaying/Impairing Wound Healing Polypharmacy;Diabetes Mellitus   Wound Therapy - Frequency --  2x/week   Wound Therapy - Current Recommendations PT   Wound Plan Continue with woundcare to Lt LE and appropriate compression bandage.    Dressing  xeroform,profore Lt LE                   PT Short Term Goals - 05/12/17 1230      PT SHORT TERM GOAL #1   Title Patient and wife to verbalize improtance of consitent compression and need to get new compression stockings every 6 months to avoid formation of more wounds    Time 2   Period Weeks   Status New   Target Date 05/26/17     PT SHORT TERM GOAL #2   Title All wounds to be 100% granulated and have decreased in size by at least 50% all measured parameters    Time 2   Period Weeks   Status New           PT Long Term Goals - 05/12/17 1231      PT LONG TERM GOAL #1   Title Patient to demonstrate that all wounds have healed to show resolution of condition    Time 4   Period Weeks   Status New     PT LONG TERM GOAL #2   Title Patient to be consistently using  LRAD for mobility to reduce fall risk    Time 4   Period Weeks   Status New             Patient will benefit from skilled therapeutic intervention in order to improve the following deficits and impairments:     Visit Diagnosis: Open wound of  left lower leg, sequela  Open wound of right lower leg, sequela  Unsteadiness on feet  History of falling     Problem List Patient Active Problem List   Diagnosis Date Noted  . Afib ([place]) 01/10/2017  . SOB (shortness of breath) 01/10/2017  . Acute diastolic CHF (congestive heart failure) ([place]) 01/10/2017  . Atrial fibrillation with RVR ([place]) 01/10/2017  . Claudication ([place]) 07/25/2016  . Paroxysmal atrial fibrillation ([place]) 07/25/2016  . Odynophagia 04/10/2014  . Allergic rhinitis 03/23/2014  . Chronic rhinitis 12/22/2013  . 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 Constipation 08/22/2011  . Obesity 09/20/2009  . G E R D 11/29/2007  . Celiac disease 11/29/2007   Teena Irani, PTA/CLT [phone]  Teena Irani 06/02/2017, 4:39 PM  [facility] [address] [place], [place], [address] Phone: [phone]   Fax:  [phone]  Name: [name] MRN: [id number] Date of Birth: [birth year]

## 2017-06-04 ENCOUNTER — Ambulatory Visit (HOSPITAL_COMMUNITY): Payer: PPO | Admitting: Physical Therapy

## 2017-06-04 ENCOUNTER — Telehealth (HOSPITAL_COMMUNITY): Payer: Self-pay | Admitting: Physical Therapy

## 2017-06-04 DIAGNOSIS — S81802S Unspecified open wound, left lower leg, sequela: Secondary | ICD-10-CM | POA: Diagnosis not present

## 2017-06-04 DIAGNOSIS — R2681 Unsteadiness on feet: Secondary | ICD-10-CM

## 2017-06-04 DIAGNOSIS — S81801S Unspecified open wound, right lower leg, sequela: Secondary | ICD-10-CM

## 2017-06-04 NOTE — Therapy (Signed)
[facility] [place], [place], [address] Phone: [phone]   Fax:  [phone]  Wound Care Therapy  Patient Details  Name: [name] MRN: [id number] Date of Birth: October 08, 1929 Referring Provider: Celene Squibb   Encounter Date: 06/04/2017      PT End of Session - 06/04/17 1546    Visit Number 7   Number of Visits 9   Date for PT Re-Evaluation 06/09/17   Authorization Type [employer]    Authorization Time Period 05/12/17 to 06/09/17   Authorization - Visit Number 7   Authorization - Number of Visits 10   PT Start Time 3419   PT Stop Time 1515   PT Time Calculation (min) 40 min   Activity Tolerance Patient tolerated treatment well   Behavior During Therapy [facility] for tasks assessed/performed      Past Medical History:  Diagnosis Date  . Allergic rhinitis   . Asthmatic bronchitis   . Atrial fibrillation ([place])   . Celiac disease   . Cellulitis   . Diverticulosis   . DM (diabetes mellitus) ([place])   . Fx. left wrist 1987  . Gastric polyps   . GERD (gastroesophageal reflux disease)   . History of pneumonia    w/pleural effusion  . Hypothyroidism   . Iron deficiency anemia   . Melanoma ([place])   . Neuropathy   . Prostate cancer ([place])   . REM sleep behavior disorder   . RLS (restless legs syndrome)   . Sleep apnea     Past Surgical History:  Procedure Laterality Date  . APPENDECTOMY    . CARPAL TUNNEL RELEASE    . CATARACT EXTRACTION  03/2011   bilateral   . CHOLECYSTECTOMY  2001  . COLONOSCOPY W/ BIOPSIES  04/27/2008   diverticulosis, duodenitis  . FOOT SURGERY    . HERNIA REPAIR  1994   bilateral  . KNEE SURGERY     x 2  . LUNG SURGERY  2001  . PROSTATECTOMY  2002  . UPPER GASTROINTESTINAL ENDOSCOPY  04/27/2008   celiac disease, gastric polyps    There were no vitals filed for this visit.                  Wound Therapy - 06/04/17 1546    Subjective Pt states he had some  discomfort at the top of his Lt anterior LE but did not last long.    Patient and Family Stated Goals wounds to heal    Date of Onset --  2 months ago   Prior Treatments antibiotic cream from MD    Evaluation and Treatment Procedures Explained to Patient/Family Yes   Evaluation and Treatment Procedures agreed to   Wound Properties Date First Assessed: 05/12/17 Wound Type: Venous stasis ulcer Location: Leg Location Orientation: Left;Anterior Wound Description (Comments): left anterior, all wounds now one big wound   Dressing Type Impregnated gauze (petrolatum);Compression wrap  xeroform and profore   Dressing Status Clean;Dry;Intact   Site / Wound Assessment Clean;Dry;Granulation tissue  weeping wounds   % Wound base Red or Granulating 100%   Closure None   Drainage Amount Scant  weeping wound   Drainage Description Serous   Treatment Cleansed;Debridement (Selective)   Selective Debridement - Location Lt anterior shin   Selective Debridement - Tools Used Forceps   Selective Debridement - Tissue Removed slough   Wound Therapy - Clinical Statement continued improvement with wound borders approximating.  Anticipate wound to be  healed at next session.   Wound Therapy - Functional Problem List non-healing wounds    Factors Delaying/Impairing Wound Healing Polypharmacy;Diabetes Mellitus   Wound Therapy - Frequency --  2x/week   Wound Therapy - Current Recommendations PT   Wound Plan Re-evaluate next session.   Dressing  xeroform,profore Lt LE                   PT Short Term Goals - 05/12/17 1230      PT SHORT TERM GOAL #1   Title Patient and wife to verbalize improtance of consitent compression and need to get new compression stockings every 6 months to avoid formation of more wounds    Time 2   Period Weeks   Status New   Target Date 05/26/17     PT SHORT TERM GOAL #2   Title All wounds to be 100% granulated and have decreased in size by at least 50% all measured  parameters    Time 2   Period Weeks   Status New           PT Long Term Goals - 05/12/17 1231      PT LONG TERM GOAL #1   Title Patient to demonstrate that all wounds have healed to show resolution of condition    Time 4   Period Weeks   Status New     PT LONG TERM GOAL #2   Title Patient to be consistently using LRAD for mobility to reduce fall risk    Time 4   Period Weeks   Status New             Patient will benefit from skilled therapeutic intervention in order to improve the following deficits and impairments:     Visit Diagnosis: Open wound of left lower leg, sequela  Open wound of right lower leg, sequela  Unsteadiness on feet     Problem List Patient Active Problem List   Diagnosis Date Noted  . Afib ([place]) 01/10/2017  . SOB (shortness of breath) 01/10/2017  . Acute diastolic CHF (congestive heart failure) ([place]) 01/10/2017  . Atrial fibrillation with RVR ([place]) 01/10/2017  . Claudication ([place]) 07/25/2016  . Paroxysmal atrial fibrillation ([place]) 07/25/2016  . Odynophagia 04/10/2014  . Allergic rhinitis 03/23/2014  . Chronic rhinitis 12/22/2013  . 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 Constipation 08/22/2011  . Obesity 09/20/2009  . G E R D 11/29/2007  . Celiac disease 11/29/2007   Teena Irani, PTA/CLT [phone]   Teena Irani 06/04/2017, 3:47 PM  [facility] [address] [place], [place], [address] Phone: [phone]   Fax:  [phone]  Name: [name] MRN: [id number] Date of Birth: Dec 18, 1928

## 2017-06-04 NOTE — Telephone Encounter (Signed)
Pt is improving and will be reduced to 54mins for Thursday apptment per Amy F

## 2017-06-08 ENCOUNTER — Ambulatory Visit (INDEPENDENT_AMBULATORY_CARE_PROVIDER_SITE_OTHER): Payer: PPO | Admitting: Podiatry

## 2017-06-08 ENCOUNTER — Encounter: Payer: Self-pay | Admitting: Podiatry

## 2017-06-08 DIAGNOSIS — G609 Hereditary and idiopathic neuropathy, unspecified: Secondary | ICD-10-CM | POA: Diagnosis not present

## 2017-06-08 DIAGNOSIS — M79676 Pain in unspecified toe(s): Secondary | ICD-10-CM | POA: Diagnosis not present

## 2017-06-08 DIAGNOSIS — B351 Tinea unguium: Secondary | ICD-10-CM | POA: Diagnosis not present

## 2017-06-08 NOTE — Progress Notes (Signed)
Patient ID: [name], male   DOB: Jun 27, 1929, 83 y.o.   MRN: [id number]   Subjective: This patient presents today for scheduled visit complaining of painful toenails and walking wearing shoes and requests toenail debridement  Objective: Orientated 3 DP and PT pulses 2/4 bilaterally Capillary reflex immediate bilaterally Sensation to 10 g monofilament wire intact 0/5 bilaterally Vibratory sensation reactive bilaterally Ankle reflex equal and reactive bilaterally Manual motor testing dorsi flexion, plantar flexion 5/5 bilaterally Patient has lower extremity compression dressing left under treatment at wound care Atrophic skin bilaterally Absent hair growth bilaterally The toenails are elongated, hypertrophic, brittle, discolored and tender to direct palpation 6-10  Assessment: Symptomatic onychomycoses 6-10 Peripheral neuropathy idiopathic  Plan: Debrided toenails 10 mechanically and electrically without any bleeding    Reappoint 3 months

## 2017-06-09 ENCOUNTER — Ambulatory Visit (HOSPITAL_COMMUNITY): Payer: PPO | Admitting: Physical Therapy

## 2017-06-10 ENCOUNTER — Ambulatory Visit: Payer: PPO | Admitting: Podiatry

## 2017-06-10 ENCOUNTER — Ambulatory Visit: Payer: PPO | Admitting: Physician Assistant

## 2017-06-11 ENCOUNTER — Ambulatory Visit (HOSPITAL_COMMUNITY): Payer: PPO | Admitting: Physical Therapy

## 2017-06-11 DIAGNOSIS — S81802S Unspecified open wound, left lower leg, sequela: Secondary | ICD-10-CM | POA: Diagnosis not present

## 2017-06-11 DIAGNOSIS — S81801S Unspecified open wound, right lower leg, sequela: Secondary | ICD-10-CM

## 2017-06-11 NOTE — Therapy (Signed)
[facility] [place], [place], [address] Phone: [phone]   Fax:  [phone]  Wound Care Therapy (Re-Assessment)  Patient Details  Name: [name] MRN: [id number] Date of Birth: March 26, 1929 Referring Provider: Celene Squibb   Encounter Date: 06/11/2017      PT End of Session - 06/11/17 1019    Visit Number 8   Number of Visits 10   Date for PT Re-Evaluation 06/18/17   Authorization Type [employer] (G-codes done 8th session)   Authorization Time Period 7/42/59 to 5/63/87; recert done 5/64   Authorization - Visit Number 8   Authorization - Number of Visits 18   PT Start Time 0946   PT Stop Time 1013  all treatment/goals of session complete   PT Time Calculation (min) 27 min   Activity Tolerance Patient tolerated treatment well   Behavior During Therapy [facility] for tasks assessed/performed      Past Medical History:  Diagnosis Date  . Allergic rhinitis   . Asthmatic bronchitis   . Atrial fibrillation ([place])   . Celiac disease   . Cellulitis   . Diverticulosis   . DM (diabetes mellitus) ([place])   . Fx. left wrist 1987  . Gastric polyps   . GERD (gastroesophageal reflux disease)   . History of pneumonia    w/pleural effusion  . Hypothyroidism   . Iron deficiency anemia   . Melanoma ([place])   . Neuropathy   . Prostate cancer ([place])   . REM sleep behavior disorder   . RLS (restless legs syndrome)   . Sleep apnea     Past Surgical History:  Procedure Laterality Date  . APPENDECTOMY    . CARPAL TUNNEL RELEASE    . CATARACT EXTRACTION  03/2011   bilateral   . CHOLECYSTECTOMY  2001  . COLONOSCOPY W/ BIOPSIES  04/27/2008   diverticulosis, duodenitis  . FOOT SURGERY    . HERNIA REPAIR  1994   bilateral  . KNEE SURGERY     x 2  . LUNG SURGERY  2001  . PROSTATECTOMY  2002  . UPPER GASTROINTESTINAL ENDOSCOPY  04/27/2008   celiac disease, gastric polyps    There were no vitals filed for this  visit.         [facility] PT Assessment - 06/11/17 0001      Assessment   Medical Diagnosis status ulcers    Referring Provider Celene Squibb    Onset Date/Surgical Date --  2 months ago    Next MD Visit Dr. Nevada Crane in November    Prior Therapy none      Precautions   Precautions Fall     Balance Screen   Has the patient fallen in the past 6 months Yes   How many times? 2   Has the patient had a decrease in activity level because of a fear of falling?  Yes   Is the patient reluctant to leave their home because of a fear of falling?  No     Prior Function   Level of Independence Independent;Independent with basic ADLs;Independent with gait;Independent with transfers   [place] time employment   Vocation Requirements pastor    Leisure reading                  Wound Therapy - 06/11/17 1016    [place] states he is doing well, no issues tolerating the profore wraps    Patient and Family Stated  Goals wounds to heal    Date of Onset --  2 months ago    Prior Treatments antibiotic cream from MD    Pain Assessment No/denies pain   Evaluation and Treatment Procedures Explained to Patient/Family Yes   Evaluation and Treatment Procedures agreed to   Wound Properties Date First Assessed: 05/12/17 Wound Type: Venous stasis ulcer Location: Leg Location Orientation: Left;Anterior Wound Description (Comments): left anterior, all wounds now one big wound   Dressing Type Impregnated gauze (petrolatum);Compression wrap   Dressing Changed Changed   Dressing Status Old drainage   Site / Wound Assessment Clean;Dry;Granulation tissue   % Wound base Red or Granulating 100%   Wound Length (cm) 1 cm   Wound Width (cm) 1.5 cm  1.5cm wide on distal 30% of wound; approx 0.5cm otherwise    Wound Depth (cm) --  skin depth    Closure None   Drainage Amount Scant   Drainage Description Serous   Treatment Cleansed;Packing (Impregnated strip);Pressure applied;Other (Comment)   cleansed, xeroform, profore    Wound Therapy - Clinical Statement Re-assessment performed today. Patient's LEs and wounds have improved greatly however he does continue to demonstrate a small open area on left anterior shin that has not quite resolved at this point. Continued to dress this area with xeroform and wrapped LE with profore with the hope that this will finally be healed and that patient may be able to DC to compression stockings next session. Recommend 1-2 more sessions to allow small remaining area of wound to heal and to facilitate full transition to compression stocking wear.    Wound Therapy - Functional Problem List non-healing wounds    Factors Delaying/Impairing Wound Healing Polypharmacy;Diabetes Mellitus   Wound Therapy - Frequency --  1X/WEEK    Wound Therapy - Current Recommendations PT   Wound Plan possible DC next session pending wound status    Dressing  xeroform, profore L LE                  PT Education - 06/11/17 1018    Education provided Yes   Education Details wound status, POC, possible DC next session if last bit of wound has healed    Person(s) Educated Patient   Methods Explanation   Comprehension Verbalized understanding          PT Short Term Goals - 06/11/17 1020      PT SHORT TERM GOAL #1   Title Patient and wife to verbalize improtance of consitent compression and need to get new compression stockings every 6 months to avoid formation of more wounds    Time 2   Period Weeks   Status Achieved     PT SHORT TERM GOAL #2   Title All wounds to be 100% granulated and have decreased in size by at least 50% all measured parameters    Time 2   Period Weeks   Status Partially Met           PT Long Term Goals - 06/11/17 1020      PT LONG TERM GOAL #1   Title Patient to demonstrate that all wounds have healed to show resolution of condition    Time 4   Period Weeks   Status On-going     PT LONG TERM GOAL #2   Title Patient to  be consistently using LRAD for mobility to reduce fall risk    Time 4   Period Weeks   Status On-going  Patient will benefit from skilled therapeutic intervention in order to improve the following deficits and impairments:     Visit Diagnosis: Open wound of left lower leg, sequela - Plan: PT plan of care cert/re-cert  Open wound of right lower leg, sequela - Plan: PT plan of care cert/re-cert      G-Codes - 25/85/27 1021    Functional Assessment Tool Used (Outpatient Only) Based on skilled clinical assessment of wound status, general steadiness    Functional Limitation Other PT primary   Other PT Primary Current Status (P8242) At least 1 percent but less than 20 percent impaired, limited or restricted   Other PT Primary Goal Status (P5361) At least 1 percent but less than 20 percent impaired, limited or restricted       Problem List Patient Active Problem List   Diagnosis Date Noted  . Afib ([place]) 01/10/2017  . SOB (shortness of breath) 01/10/2017  . Acute diastolic CHF (congestive heart failure) ([place]) 01/10/2017  . Atrial fibrillation with RVR ([place]) 01/10/2017  . Claudication ([place]) 07/25/2016  . Paroxysmal atrial fibrillation ([place]) 07/25/2016  . Odynophagia 04/10/2014  . Allergic rhinitis 03/23/2014  . Chronic rhinitis 12/22/2013  . 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 Constipation 08/22/2011  . Obesity 09/20/2009  . G E R D 11/29/2007  . Celiac disease 11/29/2007    Deniece Ree PT, DPT [facility] [address] [place], [place], [address] Phone: [phone]   Fax:  [phone]  Name: JOVANTE HAMMITT MRN: [id number] Date of Birth: 11-24-1928

## 2017-06-12 ENCOUNTER — Ambulatory Visit (INDEPENDENT_AMBULATORY_CARE_PROVIDER_SITE_OTHER): Payer: PPO | Admitting: Physician Assistant

## 2017-06-12 VITALS — BP 133/76 | HR 88 | Ht 70.0 in | Wt 228.0 lb

## 2017-06-12 DIAGNOSIS — I5032 Chronic diastolic (congestive) heart failure: Secondary | ICD-10-CM

## 2017-06-12 DIAGNOSIS — I482 Chronic atrial fibrillation, unspecified: Secondary | ICD-10-CM

## 2017-06-12 DIAGNOSIS — E039 Hypothyroidism, unspecified: Secondary | ICD-10-CM

## 2017-06-12 DIAGNOSIS — E119 Type 2 diabetes mellitus without complications: Secondary | ICD-10-CM

## 2017-06-12 NOTE — Patient Instructions (Signed)
[name], [name] recommends that you continue on your current medications as directed. Please refer to the Current Medication list given to you today.  Please keep your follow-up appointment already scheduled with Dr Gwenlyn Found on Friday, November 2nd, 2018 at 1:30p.

## 2017-06-12 NOTE — Progress Notes (Signed)
Cardiology Office Note    Date:  06/13/2017   ID:  [name], DOB Sep 19, 1929, MRN [id number]  PCP:  Celene Squibb, MD  Cardiologist:  Dr. Gwenlyn Found  Chief Complaint  Patient presents with  . Follow-up    seen for Dr. Gwenlyn Found    History of Present Illness:  [name] is a 83 y.o. male with PMH of DM, Hypothyroidism, RLS, OSA and chronic atrial fibrillation. She has been followed by Dr. Gwenlyn Found for PVD evaluation. He did have lower extremity Doppler arterial Doppler which revealed normal ABIs bilaterally. Patient he had an event monitor that showed atrial fibrillation with RVR. He was not anticoagulated due to fall risk. Otherwise he never had any heart issue. He was admitted to the hospital in March with diastolic heart failure and discharged later after diuresing 3 L. He was started on Zaroxolyn by his primary care provider prior to his a.m. Lasix dose. Because of increasing weight and lower extremity edema, he was placed on Lasix twice a day by Dr. Gwenlyn Found on 02/13/2017. Lasix was later cut back to once daily after renal function worsened, repeat basic metabolic panel obtained on 03/12/2017 showed renal function has improved.  I last saw the patient on 04/02/2017, he was on 20 mg daily of Lasix with metolazone at the time. Since then, he has been seen by Dr. Gwenlyn Found on 05/15/2017. During the last office visit, diltiazem was discontinued and metoprolol was increased from 50 mg to 75 mg twice a day. Around 2 weeks ago, according to the patient, his metolazone was discontinued. Losartan 50 mg was also discontinued around the same time. He is currently taking Telmisartan 20 mg daily. He presents today for cardiology office visit. He says since his diltiazem has been discontinued, his right lower extremity wound has completely resolved. He continued to have left lower extremity wound that is being followed by wound clinic. On physical exam today, he is still in atrial fibrillation. However heart rate seems  to be faster than what I expected. EKG was obtained which showed HR 81, I will continue current rate control medication. Based on home blood pressure diary, his systolic blood pressure ranges between 100 to 130s.   Past Medical History:  Diagnosis Date  . Allergic rhinitis   . Asthmatic bronchitis   . Atrial fibrillation ([place])   . Celiac disease   . Cellulitis   . Diverticulosis   . DM (diabetes mellitus) ([place])   . Fx. left wrist 1987  . Gastric polyps   . GERD (gastroesophageal reflux disease)   . History of pneumonia    w/pleural effusion  . Hypothyroidism   . Iron deficiency anemia   . Melanoma ([place])   . Neuropathy   . Prostate cancer ([place])   . REM sleep behavior disorder   . RLS (restless legs syndrome)   . Sleep apnea     Past Surgical History:  Procedure Laterality Date  . APPENDECTOMY    . CARPAL TUNNEL RELEASE    . CATARACT EXTRACTION  03/2011   bilateral   . CHOLECYSTECTOMY  2001  . COLONOSCOPY W/ BIOPSIES  04/27/2008   diverticulosis, duodenitis  . FOOT SURGERY    . HERNIA REPAIR  1994   bilateral  . KNEE SURGERY     x 2  . LUNG SURGERY  2001  . PROSTATECTOMY  2002  . UPPER GASTROINTESTINAL ENDOSCOPY  04/27/2008   celiac disease, gastric polyps    Current Medications: Outpatient Medications Prior  to Visit  Medication Sig Dispense Refill  . Alpha-Lipoic Acid 300 MG CAPS Take 1 capsule by mouth daily.      Marland Kitchen amitriptyline (ELAVIL) 75 MG tablet Take 75 mg by mouth at bedtime.     . Ascorbic Acid (VITAMIN C) 1000 MG tablet Take 1,000 mg by mouth 2 (two) times daily.      Marland Kitchen aspirin 81 MG tablet Take 81 mg by mouth at bedtime.     Marland Kitchen azelastine (ASTELIN) 0.1 % nasal spray Place 1 spray into both nostrils at bedtime.     . Cholecalciferol (VITAMIN D3) 1000 UNITS CAPS Take 1 tablet by mouth 3 (three) times daily.     . Coenzyme Q10 (COQ10) 200 MG CAPS Take 1 capsule by mouth daily.      . fluticasone (FLONASE) 50 MCG/ACT nasal spray Place 1 spray into both  nostrils at bedtime.     . furosemide (LASIX) 20 MG tablet Take 1 tablet (20 mg total) by mouth daily. 60 tablet 3  . levocetirizine (XYZAL) 5 MG tablet Take 5 mg by mouth at bedtime.     Marland Kitchen levothyroxine (SYNTHROID, LEVOTHROID) 150 MCG tablet Take 150 mcg by mouth daily before breakfast.    . magnesium gluconate (MAGONATE) 500 MG tablet Take 500 mg by mouth daily.      . Melatonin 3 MG TABS Take 1 tablet by mouth at bedtime.     . metoprolol tartrate (LOPRESSOR) 25 MG tablet Take 1 tablet (25 mg total) by mouth 2 (two) times daily. Take 1 tab along with 50 mg tab to equal 75 mg twice daily. 180 tablet 3  . Multiple Vitamin (MULTIVITAMIN) tablet Take 1 tablet by mouth daily.      . Omega-3 Fatty Acids (FISH OIL) 1000 MG CAPS Take 1 capsule by mouth daily.    . pantoprazole (PROTONIX) 40 MG tablet Take 40 mg by mouth daily.    Marland Kitchen senna (SENOKOT) 8.6 MG tablet Take 1 tablet by mouth daily as needed for constipation.     Marland Kitchen losartan (COZAAR) 100 MG tablet Take 50 mg by mouth daily.     . metolazone (ZAROXOLYN) 2.5 MG tablet Take 1 tablet (2.5 mg total) by mouth daily. 30 tablet 2  . metoprolol tartrate (LOPRESSOR) 25 MG tablet Take 50 mg by mouth 2 (two) times daily.     No facility-administered medications prior to visit.      Allergies:   Clindamycin; Hydrocodone; Penicillins; Procaine; Procaine hcl; Sulfonamide derivatives; Cephalexin; and Mold extract [trichophyton]   Social History   Social History  . Marital status: Married    Spouse name: N/A  . Number of children: N/A  . Years of education: N/A   Occupational History  . Retired     Theme park manager  .  [place] History Main Topics  . Smoking status: Never Smoker  . Smokeless tobacco: Never Used  . Alcohol use No  . Drug use: No  . Sexual activity: No   Other Topics Concern  . 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   ROS:   Please see the history of present illness.    ROS All other systems reviewed and  are negative.   PHYSICAL EXAM:   VS:  BP 133/76   Pulse 88   Ht 5\' 10"  (1.778 m)   Wt 228 lb (103.4 kg)   BMI 32.71 kg/m    GEN: Well nourished, well developed, in no acute distress  HEENT: normal  Neck: no JVD, carotid bruits, or masses Cardiac: Irregularly irregular; no murmurs, rubs, or gallops,no edema  Respiratory:  clear to auscultation bilaterally, normal work of breathing GI: soft, nontender, nondistended, + BS MS: no deformity or atrophy  Skin: warm and dry, no rash Neuro:  Alert and Oriented x 3, Strength and sensation are intact Psych: euthymic mood, full affect  Wt Readings from Last 3 Encounters:  06/12/17 228 lb (103.4 kg)  05/15/17 228 lb (103.4 kg)  04/02/17 229 lb (103.9 kg)      Studies/Labs Reviewed:   EKG:  EKG is ordered today.  The ekg ordered today demonstrates Atrial fibrillation right bundle branch block  Recent Labs: 01/09/2017: B Natriuretic Peptide 169.0; Hemoglobin 11.1; Platelets 392 01/10/2017: TSH 4.678 05/15/2017: BUN 37; Creatinine, Ser 1.64; Potassium 4.9; Sodium 141   Lipid Panel    Component Value Date/Time   CHOL  02/28/2009 1631    154        ATP III CLASSIFICATION:  <200     mg/dL   Desirable  200-239  mg/dL   Borderline High  >=240    mg/dL   High          TRIG 296 (H) 02/28/2009 1631   HDL 23 (L) 02/28/2009 1631   CHOLHDL 6.7 02/28/2009 1631   VLDL 59 (H) 02/28/2009 1631   LDLCALC  02/28/2009 1631    72        Total Cholesterol/HDL:CHD Risk Coronary Heart Disease Risk Table                     Men   Women  1/2 Average Risk   3.4   3.3  Average Risk       5.0   4.4  2 X Average Risk   9.6   7.1  3 X Average Risk  23.4   11.0        Use the calculated  Patient Ratio above and the CHD Risk Table to determine the patient's CHD Risk.        ATP III CLASSIFICATION (LDL):  <100     mg/dL   Optimal  100-129  mg/dL   Near or Above                    Optimal  130-159  mg/dL   Borderline  160-189  mg/dL   High  >190     mg/dL   Very High    Additional studies/ records that were reviewed today include:   Echo 01/11/2017 LV EF: 60% -   65%  Study Conclusions  - Left ventricle: The cavity size was normal. There was mild   concentric hypertrophy. Systolic function was normal. The   estimated ejection fraction was in the range of 60% to 65%. Wall   motion was normal; there were no regional wall motion   abnormalities. - Aortic valve: Valve mobility was restricted. There was mild   stenosis. There was mild regurgitation. Valve area (VTI): 1.52   cm^2. Valve area (Vmax): 1.3 cm^2. Valve area (Vmean): 1.44 cm^2. - Right ventricle: The cavity size was mildly dilated. Wall   thickness was normal.  ASSESSMENT:    1. Chronic atrial fibrillation ([place])   2. Hypothyroidism, unspecified type   3. Controlled type 2 diabetes mellitus without complication, without long-term current use of insulin ([place])   4. Chronic diastolic heart failure (HCC)      PLAN:  In order of problems listed above:  1. Chronic atrial fibrillation: Currently on aspirin, not anticoagulated due to fall risk. Currently rate controlled.  2. Hypothyroidism: On Synthroid  3. DM II: on Januvia  4. Chronic diastolic heart failure: Only trace amount of edema on physical exam, currently on low-dose Lasix. Metolazone has been discontinued.    Medication Adjustments/Labs and Tests Ordered: Current medicines are reviewed at length with the patient today.  Concerns regarding medicines are outlined above.  Medication changes, Labs and Tests ordered today are listed in the Patient Instructions below. Patient Instructions  Almyra Deforest, PA recommends that you continue on your  current medications as directed. Please refer to the Current Medication list given to you today.  Please keep your follow-up appointment already scheduled with Dr Gwenlyn Found on Friday, November 2nd, 2018 at 1:30p.    Hilbert Corrigan, [name]  06/13/2017 3:13 PM    [facility] [address], [place], [place]  [address] Phone: [phone]; Fax: [phone]

## 2017-06-13 ENCOUNTER — Encounter: Payer: Self-pay | Admitting: Physician Assistant

## 2017-06-18 ENCOUNTER — Ambulatory Visit (HOSPITAL_COMMUNITY): Payer: PPO | Attending: Internal Medicine

## 2017-06-18 DIAGNOSIS — S81801S Unspecified open wound, right lower leg, sequela: Secondary | ICD-10-CM | POA: Insufficient documentation

## 2017-06-18 DIAGNOSIS — S81802S Unspecified open wound, left lower leg, sequela: Secondary | ICD-10-CM | POA: Diagnosis not present

## 2017-06-18 DIAGNOSIS — I480 Paroxysmal atrial fibrillation: Secondary | ICD-10-CM | POA: Insufficient documentation

## 2017-06-18 DIAGNOSIS — I5031 Acute diastolic (congestive) heart failure: Secondary | ICD-10-CM | POA: Insufficient documentation

## 2017-06-18 NOTE — Therapy (Signed)
[place], [place], [address] Phone: [phone]   Fax:  [phone]  Wound Care Therapy  Patient Details  Name: [name] MRN: [id number] Date of Birth: 05/20/1929 Referring Provider: Celene Squibb   Encounter Date: 06/18/2017      PT End of Session - 06/18/17 1722    Visit Number 9   Number of Visits 10   Date for PT Re-Evaluation 06/25/17   Authorization Type [employer] (G-codes done 8th session)   Authorization Time Period 01/26/37 to 4/53/64; recert done 6/80   Authorization - Visit Number 9   Authorization - Number of Visits 18   PT Start Time 3212   PT Stop Time 1115   PT Time Calculation (min) 43 min   Activity Tolerance Patient tolerated treatment well   Behavior During Therapy [facility] for tasks assessed/performed      Past Medical History:  Diagnosis Date  . Allergic rhinitis   . Asthmatic bronchitis   . Atrial fibrillation ([place])   . Celiac disease   . Cellulitis   . Diverticulosis   . DM (diabetes mellitus) ([place])   . Fx. left wrist 1987  . Gastric polyps   . GERD (gastroesophageal reflux disease)   . History of pneumonia    w/pleural effusion  . Hypothyroidism   . Iron deficiency anemia   . Melanoma ([place])   . Neuropathy   . Prostate cancer ([place])   . REM sleep behavior disorder   . RLS (restless legs syndrome)   . Sleep apnea     Past Surgical History:  Procedure Laterality Date  . APPENDECTOMY    . CARPAL TUNNEL RELEASE    . CATARACT EXTRACTION  03/2011   bilateral   . CHOLECYSTECTOMY  2001  . COLONOSCOPY W/ BIOPSIES  04/27/2008   diverticulosis, duodenitis  . FOOT SURGERY    . HERNIA REPAIR  1994   bilateral  . KNEE SURGERY     x 2  . LUNG SURGERY  2001  . PROSTATECTOMY  2002  . UPPER GASTROINTESTINAL ENDOSCOPY  04/27/2008   celiac disease, gastric polyps    There were no vitals filed for this visit.       Subjective Assessment - 06/18/17 1122    Subjective Pt arrived wiht reports of dressings sliding down LE with increased swelling.  Increased Lt knee pain today, 4/10   Currently in Pain? Yes   Pain Score 4    Pain Location Knee   Pain Orientation Left   Pain Type Chronic pain          06/18/17 1122  Subjective Assessment  Subjective Pt arrived wiht reports of dressings sliding down LE with increased swelling.  Increased Lt knee pain today, 4/10  Patient and Family Stated Goals wounds to heal   Date of Onset (2 months ago)  Prior Treatments antibiotic cream from MD   Pain Assessment  Pain Assessment 0-10  Pain Score 4  Pain Type Chronic pain  Pain Location Knee  Pain Orientation Left  Evaluation and Treatment  Evaluation and Treatment Procedures Explained to Patient/Family Yes  Evaluation and Treatment Procedures agreed to  Wound / Incision (Open or Dehisced) 05/12/17 Venous stasis ulcer Leg Left;Anterior left anterior, all wounds now one big wound  Date First Assessed: 05/12/17   Wound Type: Venous stasis ulcer  Location: Leg  Location Orientation: Left;Anterior  Wound Description (Comments): left anterior, all wounds now one big wound  Dressing  Type Impregnated gauze (petrolatum);Compression wrap (xeroform, profore wrap with additional foam)  Dressing Changed Changed  Dressing Status Old drainage  Site / Wound Assessment Clean;Dry;Granulation tissue  % Wound base Red or Granulating 100%  Wound Length (cm) 0.9 cm (Proximal L.9x W1.1; Distal L.8x W 1.1cm)  Wound Width (cm) 1.1 cm (Proximal L.9x W1.1; Distal L.8x W 1.1cm)  Wound Depth (cm) 0 cm  Closure None  Drainage Amount Scant  Drainage Description Serous  Treatment Cleansed;Debridement (Selective)  Wound / Incision (Open or Dehisced) 06/18/17 Incision - Open Leg Posterior incision from dressing slide  Date First Assessed/Time First Assessed: 06/18/17 1100   Wound Type: Incision - Open  Location: Leg  Location Orientation: Posterior  Wound Description  (Comments): incision from dressing slide  Dressing Type Impregnated gauze (bismuth);Compression wrap (xeroform, lotion and profore wrap)  Dressing Changed New  Dressing Status Clean;Dry;Intact;Old drainage  Site / Wound Assessment Clean;Dry;Granulation tissue  % Wound base Red or Granulating 95%  % Wound base Yellow/Fibrinous Exudate 5%  Wound Length (cm) 0.3 cm  Wound Width (cm) 0.1 cm  Wound Depth (cm) 0.1 cm  Drainage Amount Scant  Drainage Description Serous  Treatment Cleansed;Debridement (Selective)  Selective Debridement  Selective Debridement - Location Lt anterior shin and posterior calf  Selective Debridement - Tools Used Forceps  Selective Debridement - Tissue Removed slough  Wound Therapy - Assess/Plan/Recommendations  Wound Therapy - Clinical Statement Pt arrived with dressing slid halfway down LE, increased swelling above dressings and new wound posterior calf with reports of weaping from pt due to the dressings slid.  Wounds cleansed and selective debridement for removal of slough.  Continued wiht xeroform, and profore wrap with cone formation to improve circulation and reduce wrap sliding down LE.    Wound Therapy - Functional Problem List non-healing wounds   Factors Delaying/Impairing Wound Healing Polypharmacy;Diabetes Mellitus  Wound Therapy - Frequency (2x/ week)  Wound Therapy - Current Recommendations PT  Wound Plan Continue wiht appropriate dressings and compression garments.  Make sure to apply the cone shape to reduce wrap sliding down and reduce risk of another wound.  Wound Therapy  Dressing  xeroform, profore L LE               PT Short Term Goals - 06/11/17 1020      PT SHORT TERM GOAL #1   Title Patient and wife to verbalize improtance of consitent compression and need to get new compression stockings every 6 months to avoid formation of more wounds    Time 2   Period Weeks   Status Achieved     PT SHORT TERM GOAL #2   Title All wounds to  be 100% granulated and have decreased in size by at least 50% all measured parameters    Time 2   Period Weeks   Status Partially Met           PT Long Term Goals - 06/11/17 1020      PT LONG TERM GOAL #1   Title Patient to demonstrate that all wounds have healed to show resolution of condition    Time 4   Period Weeks   Status On-going     PT LONG TERM GOAL #2   Title Patient to be consistently using LRAD for mobility to reduce fall risk    Time 4   Period Weeks   Status On-going             Patient will benefit from skilled therapeutic intervention  in order to improve the following deficits and impairments:     Visit Diagnosis: Open wound of left lower leg, sequela  Open wound of right lower leg, sequela     Problem List Patient Active Problem List   Diagnosis Date Noted  . Afib ([place]) 01/10/2017  . SOB (shortness of breath) 01/10/2017  . Acute diastolic CHF (congestive heart failure) ([place]) 01/10/2017  . Atrial fibrillation with RVR ([place]) 01/10/2017  . Claudication ([place]) 07/25/2016  . Paroxysmal atrial fibrillation ([place]) 07/25/2016  . Odynophagia 04/10/2014  . Allergic rhinitis 03/23/2014  . Chronic rhinitis 12/22/2013  . 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 Constipation 08/22/2011  . Obesity 09/20/2009  . G E R D 11/29/2007  . Celiac disease 11/29/2007    Aldona Lento 06/18/2017, 6:21 PM  [place], [place], [address] Phone: [phone]   Fax:  [phone]  Name: [name] MRN: [id number] Date of Birth: [birth year]

## 2017-06-22 DIAGNOSIS — R944 Abnormal results of kidney function studies: Secondary | ICD-10-CM | POA: Diagnosis not present

## 2017-06-22 DIAGNOSIS — I10 Essential (primary) hypertension: Secondary | ICD-10-CM | POA: Diagnosis not present

## 2017-06-22 DIAGNOSIS — E875 Hyperkalemia: Secondary | ICD-10-CM | POA: Diagnosis not present

## 2017-06-22 DIAGNOSIS — I482 Chronic atrial fibrillation: Secondary | ICD-10-CM | POA: Diagnosis not present

## 2017-06-22 DIAGNOSIS — N189 Chronic kidney disease, unspecified: Secondary | ICD-10-CM | POA: Diagnosis not present

## 2017-06-22 DIAGNOSIS — R600 Localized edema: Secondary | ICD-10-CM | POA: Diagnosis not present

## 2017-06-22 DIAGNOSIS — R258 Other abnormal involuntary movements: Secondary | ICD-10-CM | POA: Diagnosis not present

## 2017-06-22 DIAGNOSIS — R05 Cough: Secondary | ICD-10-CM | POA: Diagnosis not present

## 2017-06-22 DIAGNOSIS — Z6832 Body mass index (BMI) 32.0-32.9, adult: Secondary | ICD-10-CM | POA: Diagnosis not present

## 2017-06-25 ENCOUNTER — Ambulatory Visit (HOSPITAL_COMMUNITY): Payer: PPO | Admitting: Physical Therapy

## 2017-06-25 DIAGNOSIS — S81802S Unspecified open wound, left lower leg, sequela: Secondary | ICD-10-CM

## 2017-06-25 DIAGNOSIS — R609 Edema, unspecified: Secondary | ICD-10-CM | POA: Diagnosis not present

## 2017-06-25 DIAGNOSIS — S81801S Unspecified open wound, right lower leg, sequela: Secondary | ICD-10-CM

## 2017-06-25 DIAGNOSIS — I890 Lymphedema, not elsewhere classified: Secondary | ICD-10-CM | POA: Diagnosis not present

## 2017-06-25 NOTE — Therapy (Signed)
[place], [place], [address] Phone: [phone]   Fax:  [phone]  Wound Care Therapy (Discharge)  Patient Details  Name: [name] MRN: [id number] Date of Birth: 1928/10/14 Referring Provider: Celene Squibb   Encounter Date: 06/25/2017      PT End of Session - 06/25/17 1139    Visit Number 10   Number of Visits 10   Date for PT Re-Evaluation 06/25/17   Authorization Type [employer] (G-codes done 8th session)   Authorization Time Period 2/95/28 to 01/24/23; recert done 4/01   Authorization - Visit Number 10   Authorization - Number of Visits 18   PT Start Time 0272   PT Stop Time 1055   PT Time Calculation (min) 20 min   Activity Tolerance Patient tolerated treatment well   Behavior During Therapy [facility] for tasks assessed/performed      Past Medical History:  Diagnosis Date  . Allergic rhinitis   . Asthmatic bronchitis   . Atrial fibrillation ([place])   . Celiac disease   . Cellulitis   . Diverticulosis   . DM (diabetes mellitus) ([place])   . Fx. left wrist 1987  . Gastric polyps   . GERD (gastroesophageal reflux disease)   . History of pneumonia    w/pleural effusion  . Hypothyroidism   . Iron deficiency anemia   . Melanoma ([place])   . Neuropathy   . Prostate cancer ([place])   . REM sleep behavior disorder   . RLS (restless legs syndrome)   . Sleep apnea     Past Surgical History:  Procedure Laterality Date  . APPENDECTOMY    . CARPAL TUNNEL RELEASE    . CATARACT EXTRACTION  03/2011   bilateral   . CHOLECYSTECTOMY  2001  . COLONOSCOPY W/ BIOPSIES  04/27/2008   diverticulosis, duodenitis  . FOOT SURGERY    . HERNIA REPAIR  1994   bilateral  . KNEE SURGERY     x 2  . LUNG SURGERY  2001  . PROSTATECTOMY  2002  . UPPER GASTROINTESTINAL ENDOSCOPY  04/27/2008   celiac disease, gastric polyps    There were no vitals filed for this visit.                  Wound Therapy  - 06/25/17 1135    Subjective Pt arrived with dressing intact and not slid from original positioning. no pain or issues   Evaluation and Treatment Procedures Explained to Patient/Family Yes   Wound Properties Date First Assessed: 05/12/17 Wound Type: Venous stasis ulcer Location: Leg Location Orientation: Left;Anterior Wound Description (Comments): left anterior, all wounds now one big wound   Wound Properties Date First Assessed: 06/18/17 Time First Assessed: 1100 Wound Type: Incision - Open Location: Leg Location Orientation: Posterior Wound Description (Comments): incision from dressing slide   Wound Therapy - Clinical Statement All wounds are now completley healed and patient is independent with leg care and donning/doffing stockings.    Wound Plan discharged as with no further skilled need                 PT Education - 06/25/17 1138    Education provided Yes   Education Details Return to MD if wounds return or develop any further wounds. Wear stockings daily, remove in evenings to moisturize LE's.   Person(s) Educated Patient   Methods Explanation   Comprehension Verbalized understanding  PT Short Term Goals - 06/26/2017 1139      PT SHORT TERM GOAL #1   Title Patient and wife to verbalize improtance of consitent compression and need to get new compression stockings every 6 months to avoid formation of more wounds    Time 2   Period Weeks   Status Achieved     PT SHORT TERM GOAL #2   Title All wounds to be 100% granulated and have decreased in size by at least 50% all measured parameters    Time 2   Period Weeks   Status Achieved           PT Long Term Goals - Jun 26, 2017 1139      PT LONG TERM GOAL #1   Title Patient to demonstrate that all wounds have healed to show resolution of condition    Time 4   Period Weeks   Status Achieved     PT LONG TERM GOAL #2   Title Patient to be consistently using LRAD for mobility to reduce fall risk    Time 4    Period Weeks   Status Achieved             Patient will benefit from skilled therapeutic intervention in order to improve the following deficits and impairments:     Visit Diagnosis: Open wound of left lower leg, sequela  Open wound of right lower leg, sequela    Problem List Patient Active Problem List   Diagnosis Date Noted  . Afib ([place]) 01/10/2017  . SOB (shortness of breath) 01/10/2017  . Acute diastolic CHF (congestive heart failure) ([place]) 01/10/2017  . Atrial fibrillation with RVR ([place]) 01/10/2017  . Claudication ([place]) 07/25/2016  . Paroxysmal atrial fibrillation ([place]) 07/25/2016  . Odynophagia 04/10/2014  . Allergic rhinitis 03/23/2014  . Chronic rhinitis 12/22/2013  . 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 Constipation 08/22/2011  . Obesity 09/20/2009  . G E R D 11/29/2007  . Celiac disease 11/29/2007   Teena Irani, PTA/CLT [phone]      G-Codes - 06/26/2017 1151    Functional Assessment Tool Used (Outpatient Only) Based on skilled clinical assessment of wound status, general steadiness    Functional Limitation Other PT primary   Other PT Primary Goal Status [phone]) At least 1 percent but less than 20 percent impaired, limited or restricted   Other PT Primary Discharge Status [phone]) 0 percent impaired, limited or restricted      PHYSICAL THERAPY DISCHARGE SUMMARY  Visits from Start of Care: 10  Current functional level related to goals / functional outcomes: Wound is healed and patient independent in management and care of compression stockings. DC today.    Remaining deficits: See above    Education / Equipment: See above  Plan: Patient agrees to discharge.  Patient goals were met. Patient is being discharged due to meeting the stated rehab goals.  ?????       Deniece Ree PT, DPT [place] [address] [place], [place], [address] Phone: [phone]   Fax:   [phone]  Name: [name] MRN: [id number] Date of Birth: 08/14/1929

## 2017-07-06 DIAGNOSIS — E119 Type 2 diabetes mellitus without complications: Secondary | ICD-10-CM | POA: Diagnosis not present

## 2017-07-21 DIAGNOSIS — Z23 Encounter for immunization: Secondary | ICD-10-CM | POA: Diagnosis not present

## 2017-08-14 ENCOUNTER — Ambulatory Visit (INDEPENDENT_AMBULATORY_CARE_PROVIDER_SITE_OTHER): Payer: PPO | Admitting: Cardiovascular Disease

## 2017-08-14 ENCOUNTER — Encounter: Payer: Self-pay | Admitting: Cardiovascular Disease

## 2017-08-14 DIAGNOSIS — I5031 Acute diastolic (congestive) heart failure: Secondary | ICD-10-CM

## 2017-08-14 DIAGNOSIS — I739 Peripheral vascular disease, unspecified: Secondary | ICD-10-CM

## 2017-08-14 DIAGNOSIS — I480 Paroxysmal atrial fibrillation: Secondary | ICD-10-CM | POA: Diagnosis not present

## 2017-08-14 NOTE — Progress Notes (Signed)
08/14/2017 [name]   07-17-1929  [id number]  Primary Physician Calvin Squibb, MD Primary Cardiologist: Calvin Harp MD [name], [place], [place]  HPI:  [name] is a 83 y.o. male mild to moderately overweight married Caucasian male father of 28, grandfather and 6 grandchildren who is accompanied by his wife [name] today. I last saw him in the office 05/15/17.Marland Kitchen He was referred by Dr. Wende Morgan for peripheral vascular evaluation. He is a Engineering geologist. He lives in [place]. He has no cardiac risk factors. He does complain of lifestyle limiting bilateral lower extremity claudication. There also was a question of recently diagnosed atrial fibrillation. He has never had a heart attack or stroke. He denies chest pain but does get some some shortness of breath on exertion especially when his legs are bothering him. He did have lower extremity Doppler studies that revealed normal ABIs bilaterally. In addition, he had an event monitor that showed A. fib with RVR. I elected not to anticoagulate him because of fall risk. He was admitted to the hospital on 01/25/82 with diastolic heart failure and discharged 3 days later. He was diuresed 3 L. He was recently placed on Zaroxolyn by his PCP in the morning prior to his a.m. Lasix dose. He weighs himself on a daily basis. We reiterated the importance of salt restriction. Because of slightly increasing weight and lower extremity edema added low-dose Zaroxolyn. His serum creatinine has somewhat increased. It is possible that this is also somewhat related to the diltiazem. His diltiazem was discontinued and he is on a beta blocker now for rate control. He has no longer on Zaroxolyn.His weight has remained stable. His aware of salt restriction. He does oes wear 20-30 mm compression stockings. His wounds have healed.   Current Meds  Medication Sig  . Alpha-Lipoic Acid 300 MG CAPS Take 1 capsule by mouth daily.    Marland Kitchen amitriptyline  (ELAVIL) 75 MG tablet Take 75 mg by mouth at bedtime.   . Ascorbic Acid (VITAMIN C) 1000 MG tablet Take 1,000 mg by mouth 2 (two) times daily.    Marland Kitchen aspirin 81 MG tablet Take 81 mg by mouth at bedtime.   Marland Kitchen azelastine (ASTELIN) 0.1 % nasal spray Place 1 spray into both nostrils at bedtime.   . Cholecalciferol (VITAMIN D3) 1000 UNITS CAPS Take 1 tablet by mouth 3 (three) times daily.   . Coenzyme Q10 (COQ10) 200 MG CAPS Take 1 capsule by mouth daily.    . fluticasone (FLONASE) 50 MCG/ACT nasal spray Place 1 spray into both nostrils at bedtime.   . furosemide (LASIX) 20 MG tablet Take 1 tablet (20 mg total) by mouth daily.  Marland Kitchen levocetirizine (XYZAL) 5 MG tablet Take 5 mg by mouth at bedtime.   Marland Kitchen levothyroxine (SYNTHROID, LEVOTHROID) 150 MCG tablet Take 150 mcg by mouth daily before breakfast.  . magnesium gluconate (MAGONATE) 500 MG tablet Take 500 mg by mouth daily.    . Melatonin 3 MG TABS Take 1 tablet by mouth at bedtime.   . Multiple Vitamin (MULTIVITAMIN) tablet Take 1 tablet by mouth daily.    . Omega-3 Fatty Acids (FISH OIL) 1000 MG CAPS Take 1 capsule by mouth daily.  . pantoprazole (PROTONIX) 40 MG tablet Take 40 mg by mouth daily.  . Red Yeast Rice Extract 600 MG TABS Take by mouth daily.  Marland Kitchen senna (SENOKOT) 8.6 MG tablet Take 1 tablet by mouth daily as needed for constipation.   Marland Kitchen  sitaGLIPtin (JANUVIA) 50 MG tablet Take 50 mg by mouth daily.  Marland Kitchen telmisartan (MICARDIS) 20 MG tablet Take 20 mg by mouth daily.     Allergies  Allergen Reactions  . Clindamycin Diarrhea  . Hydrocodone   . Penicillins     Has patient had a PCN reaction causing immediate rash, facial/tongue/throat swelling, SOB or lightheadedness with hypotension: No Has patient had a PCN reaction causing severe rash involving mucus membranes or skin necrosis: No Has patient had a PCN reaction that required hospitalization No Has patient had a PCN reaction occurring within the last 10 years: No If all of the above answers  are "NO", then may proceed with Cephalosporin use.   . Procaine Other (See Comments)    "Passes out"  . Procaine Hcl   . Sulfonamide Derivatives   . Cephalexin Nausea And Vomiting    Dry hives, "violent" diarhea  . Mold Extract [Trichophyton] Other (See Comments)    Stuffiness, post nasal drip    Social History   Social History  . Marital status: Married    Spouse name: N/A  . Number of children: N/A  . Years of education: N/A   Occupational History  . Retired     Theme park manager  .  [place] History Main Topics  . Smoking status: Never Smoker  . Smokeless tobacco: Never Used  . Alcohol use No  . Drug use: No  . Sexual activity: No   Other Topics Concern  . 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  Cardiovascular: negative for chest pain, dyspnea on exertion, edema, orthopnea, palpitations, paroxysmal nocturnal dyspnea or shortness of breath Dermatological: negative for rash Respiratory: negative for cough or wheezing Urologic: negative for hematuria Abdominal: negative for nausea, vomiting, diarrhea, bright red blood per rectum, melena, or hematemesis Neurologic: negative for visual changes, syncope, or dizziness All other systems reviewed and are otherwise negative except as noted above.    Blood pressure 123/73, pulse 93, height 5\' 10"  (1.778 m), weight 230 lb 9.6 oz (104.6 kg).  General appearance: alert and no distress Neck: no adenopathy, no carotid bruit, no JVD, supple, symmetrical, trachea midline and thyroid not enlarged, symmetric, no tenderness/mass/nodules Lungs: clear to auscultation bilaterally Heart: irregularly irregular rhythm Extremities: extremities normal, atraumatic, no cyanosis or edema Pulses: 2+ and symmetric Skin: Skin color, texture, turgor normal. No rashes or  lesions Neurologic: Alert and oriented X 3, normal strength and tone. Normal symmetric reflexes. Normal coordination and gait  EKG not performed today  ASSESSMENT AND PLAN:   Claudication [facility]) History of claudication with normal Dopplers and ABIs bilaterally.  Paroxysmal atrial fibrillation (HCC) History of atrial fibrillation rate controlled not on anticoagulation because of fall risk.  Acute diastolic CHF (congestive heart failure) (HCC) History of diastolic heart failure on oral diuretics.      Calvin Harp MD FACP,FACC,FAHA, [facility] 08/14/2017 2:03 PM

## 2017-08-14 NOTE — Assessment & Plan Note (Signed)
History of claudication with normal Dopplers and ABIs bilaterally.

## 2017-08-14 NOTE — Assessment & Plan Note (Signed)
History of diastolic heart failure on oral diuretics. 

## 2017-08-14 NOTE — Patient Instructions (Signed)
Medication Instructions: Your physician recommends that you continue on your current medications as directed. Please refer to the Current Medication list given to you today.   Follow-Up: We request that you follow-up in: 6 months with Almyra Deforest, PA and in 12 months with Dr Andria Rhein will receive a reminder letter in the mail two months in advance. If you don't receive a letter, please call our office to schedule the follow-up appointment.  If you need a refill on your cardiac medications before your next appointment, please call your pharmacy.

## 2017-08-14 NOTE — Assessment & Plan Note (Signed)
History of atrial fibrillation rate controlled not on anticoagulation because of fall risk.

## 2017-08-27 DIAGNOSIS — L97811 Non-pressure chronic ulcer of other part of right lower leg limited to breakdown of skin: Secondary | ICD-10-CM | POA: Diagnosis not present

## 2017-09-02 DIAGNOSIS — I10 Essential (primary) hypertension: Secondary | ICD-10-CM | POA: Diagnosis not present

## 2017-09-02 DIAGNOSIS — E782 Mixed hyperlipidemia: Secondary | ICD-10-CM | POA: Diagnosis not present

## 2017-09-02 DIAGNOSIS — E1142 Type 2 diabetes mellitus with diabetic polyneuropathy: Secondary | ICD-10-CM | POA: Diagnosis not present

## 2017-09-07 ENCOUNTER — Ambulatory Visit: Payer: PPO | Admitting: Podiatry

## 2017-09-07 ENCOUNTER — Encounter: Payer: Self-pay | Admitting: Podiatry

## 2017-09-07 DIAGNOSIS — M79675 Pain in left toe(s): Secondary | ICD-10-CM

## 2017-09-07 DIAGNOSIS — M79674 Pain in right toe(s): Secondary | ICD-10-CM

## 2017-09-07 DIAGNOSIS — B351 Tinea unguium: Secondary | ICD-10-CM

## 2017-09-07 DIAGNOSIS — G609 Hereditary and idiopathic neuropathy, unspecified: Secondary | ICD-10-CM

## 2017-09-07 NOTE — Progress Notes (Signed)
Patient ID: [name], male   DOB: 07/25/1929, 83 y.o.   MRN: [id number]    Subjective: This patient presents today for scheduled visit complaining of painful toenails and walking wearing shoes and requests toenail debridement  Objective: Orientated 3 DP and PT pulses 2/4 bilaterally Capillary reflex immediate bilaterally Sensation to 10 g monofilament wire intact 0/5 bilaterally Vibratory sensation reactive bilaterally Ankle reflex equal and reactive bilaterally Manual motor testing dorsi flexion, plantar flexion 5/5 bilaterally Patient has lower extremity compression dressing left under treatment at wound care Atrophic skin bilaterally Absent hair growth bilaterally The toenails are elongated, hypertrophic, brittle, discolored and tender to direct palpation 6-10  Assessment: Symptomatic onychomycoses 6-10 Peripheral neuropathy idiopathic  Plan: Debrided toenails 10 mechanically and electrically without any bleeding    Reappoint 3 months

## 2017-09-08 DIAGNOSIS — K900 Celiac disease: Secondary | ICD-10-CM | POA: Diagnosis not present

## 2017-09-08 DIAGNOSIS — I482 Chronic atrial fibrillation: Secondary | ICD-10-CM | POA: Diagnosis not present

## 2017-09-08 DIAGNOSIS — R944 Abnormal results of kidney function studies: Secondary | ICD-10-CM | POA: Diagnosis not present

## 2017-09-08 DIAGNOSIS — E875 Hyperkalemia: Secondary | ICD-10-CM | POA: Diagnosis not present

## 2017-09-08 DIAGNOSIS — I5031 Acute diastolic (congestive) heart failure: Secondary | ICD-10-CM | POA: Diagnosis not present

## 2017-09-08 DIAGNOSIS — D509 Iron deficiency anemia, unspecified: Secondary | ICD-10-CM | POA: Diagnosis not present

## 2017-09-08 DIAGNOSIS — Z6833 Body mass index (BMI) 33.0-33.9, adult: Secondary | ICD-10-CM | POA: Diagnosis not present

## 2017-09-08 DIAGNOSIS — I10 Essential (primary) hypertension: Secondary | ICD-10-CM | POA: Diagnosis not present

## 2017-09-08 DIAGNOSIS — J410 Simple chronic bronchitis: Secondary | ICD-10-CM | POA: Diagnosis not present

## 2017-09-08 DIAGNOSIS — E1142 Type 2 diabetes mellitus with diabetic polyneuropathy: Secondary | ICD-10-CM | POA: Diagnosis not present

## 2017-09-08 DIAGNOSIS — E782 Mixed hyperlipidemia: Secondary | ICD-10-CM | POA: Diagnosis not present

## 2017-09-08 DIAGNOSIS — R5383 Other fatigue: Secondary | ICD-10-CM | POA: Diagnosis not present

## 2017-09-12 DIAGNOSIS — I639 Cerebral infarction, unspecified: Secondary | ICD-10-CM

## 2017-09-12 HISTORY — DX: Cerebral infarction, unspecified: I63.9

## 2017-09-18 DIAGNOSIS — I5031 Acute diastolic (congestive) heart failure: Secondary | ICD-10-CM | POA: Diagnosis not present

## 2017-09-18 DIAGNOSIS — I10 Essential (primary) hypertension: Secondary | ICD-10-CM | POA: Diagnosis not present

## 2017-09-18 DIAGNOSIS — I482 Chronic atrial fibrillation: Secondary | ICD-10-CM | POA: Diagnosis not present

## 2017-09-18 DIAGNOSIS — Z6830 Body mass index (BMI) 30.0-30.9, adult: Secondary | ICD-10-CM | POA: Diagnosis not present

## 2017-09-26 DIAGNOSIS — E119 Type 2 diabetes mellitus without complications: Secondary | ICD-10-CM | POA: Diagnosis not present

## 2017-10-01 DIAGNOSIS — L0213 Carbuncle of neck: Secondary | ICD-10-CM | POA: Diagnosis not present

## 2017-10-02 ENCOUNTER — Encounter (HOSPITAL_COMMUNITY): Payer: Self-pay

## 2017-10-02 ENCOUNTER — Emergency Department (HOSPITAL_COMMUNITY): Payer: PPO

## 2017-10-02 ENCOUNTER — Inpatient Hospital Stay (HOSPITAL_COMMUNITY)
Admission: EM | Admit: 2017-10-02 | Discharge: 2017-10-07 | DRG: 062 | Disposition: A | Discharge status: Rehabilitation Facility | Payer: PPO | Attending: Neurology | Admitting: Neurology

## 2017-10-02 DIAGNOSIS — Z841 Family history of disorders of kidney and ureter: Secondary | ICD-10-CM

## 2017-10-02 DIAGNOSIS — G2581 Restless legs syndrome: Secondary | ICD-10-CM | POA: Diagnosis present

## 2017-10-02 DIAGNOSIS — Z8546 Personal history of malignant neoplasm of prostate: Secondary | ICD-10-CM | POA: Diagnosis not present

## 2017-10-02 DIAGNOSIS — E1169 Type 2 diabetes mellitus with other specified complication: Secondary | ICD-10-CM | POA: Diagnosis not present

## 2017-10-02 DIAGNOSIS — K900 Celiac disease: Secondary | ICD-10-CM | POA: Diagnosis present

## 2017-10-02 DIAGNOSIS — I63412 Cerebral infarction due to embolism of left middle cerebral artery: Secondary | ICD-10-CM | POA: Diagnosis not present

## 2017-10-02 DIAGNOSIS — Z880 Allergy status to penicillin: Secondary | ICD-10-CM

## 2017-10-02 DIAGNOSIS — Z9842 Cataract extraction status, left eye: Secondary | ICD-10-CM

## 2017-10-02 DIAGNOSIS — I482 Chronic atrial fibrillation: Secondary | ICD-10-CM | POA: Diagnosis not present

## 2017-10-02 DIAGNOSIS — Z6830 Body mass index (BMI) 30.0-30.9, adult: Secondary | ICD-10-CM | POA: Diagnosis not present

## 2017-10-02 DIAGNOSIS — Z888 Allergy status to other drugs, medicaments and biological substances status: Secondary | ICD-10-CM

## 2017-10-02 DIAGNOSIS — R296 Repeated falls: Secondary | ICD-10-CM | POA: Diagnosis not present

## 2017-10-02 DIAGNOSIS — J45909 Unspecified asthma, uncomplicated: Secondary | ICD-10-CM | POA: Diagnosis not present

## 2017-10-02 DIAGNOSIS — I639 Cerebral infarction, unspecified: Secondary | ICD-10-CM | POA: Diagnosis present

## 2017-10-02 DIAGNOSIS — Z9049 Acquired absence of other specified parts of digestive tract: Secondary | ICD-10-CM

## 2017-10-02 DIAGNOSIS — R270 Ataxia, unspecified: Secondary | ICD-10-CM | POA: Diagnosis present

## 2017-10-02 DIAGNOSIS — E1151 Type 2 diabetes mellitus with diabetic peripheral angiopathy without gangrene: Secondary | ICD-10-CM | POA: Diagnosis present

## 2017-10-02 DIAGNOSIS — Z885 Allergy status to narcotic agent status: Secondary | ICD-10-CM

## 2017-10-02 DIAGNOSIS — K5901 Slow transit constipation: Secondary | ICD-10-CM | POA: Diagnosis not present

## 2017-10-02 DIAGNOSIS — G4700 Insomnia, unspecified: Secondary | ICD-10-CM | POA: Diagnosis not present

## 2017-10-02 DIAGNOSIS — G936 Cerebral edema: Secondary | ICD-10-CM | POA: Diagnosis not present

## 2017-10-02 DIAGNOSIS — I69320 Aphasia following cerebral infarction: Secondary | ICD-10-CM | POA: Diagnosis not present

## 2017-10-02 DIAGNOSIS — I10 Essential (primary) hypertension: Secondary | ICD-10-CM

## 2017-10-02 DIAGNOSIS — E785 Hyperlipidemia, unspecified: Secondary | ICD-10-CM | POA: Diagnosis not present

## 2017-10-02 DIAGNOSIS — I509 Heart failure, unspecified: Secondary | ICD-10-CM | POA: Diagnosis present

## 2017-10-02 DIAGNOSIS — E1142 Type 2 diabetes mellitus with diabetic polyneuropathy: Secondary | ICD-10-CM | POA: Diagnosis not present

## 2017-10-02 DIAGNOSIS — Z7982 Long term (current) use of aspirin: Secondary | ICD-10-CM

## 2017-10-02 DIAGNOSIS — E669 Obesity, unspecified: Secondary | ICD-10-CM

## 2017-10-02 DIAGNOSIS — Z9079 Acquired absence of other genital organ(s): Secondary | ICD-10-CM | POA: Diagnosis not present

## 2017-10-02 DIAGNOSIS — Z8249 Family history of ischemic heart disease and other diseases of the circulatory system: Secondary | ICD-10-CM

## 2017-10-02 DIAGNOSIS — I6789 Other cerebrovascular disease: Secondary | ICD-10-CM | POA: Diagnosis not present

## 2017-10-02 DIAGNOSIS — IMO0002 Reserved for concepts with insufficient information to code with codable children: Secondary | ICD-10-CM

## 2017-10-02 DIAGNOSIS — L0293 Carbuncle, unspecified: Secondary | ICD-10-CM | POA: Diagnosis present

## 2017-10-02 DIAGNOSIS — Z79899 Other long term (current) drug therapy: Secondary | ICD-10-CM

## 2017-10-02 DIAGNOSIS — N179 Acute kidney failure, unspecified: Secondary | ICD-10-CM | POA: Diagnosis present

## 2017-10-02 DIAGNOSIS — Z882 Allergy status to sulfonamides status: Secondary | ICD-10-CM

## 2017-10-02 DIAGNOSIS — K219 Gastro-esophageal reflux disease without esophagitis: Secondary | ICD-10-CM | POA: Diagnosis present

## 2017-10-02 DIAGNOSIS — F4489 Other dissociative and conversion disorders: Secondary | ICD-10-CM | POA: Diagnosis not present

## 2017-10-02 DIAGNOSIS — D638 Anemia in other chronic diseases classified elsewhere: Secondary | ICD-10-CM | POA: Diagnosis present

## 2017-10-02 DIAGNOSIS — R29704 NIHSS score 4: Secondary | ICD-10-CM | POA: Diagnosis not present

## 2017-10-02 DIAGNOSIS — I480 Paroxysmal atrial fibrillation: Secondary | ICD-10-CM

## 2017-10-02 DIAGNOSIS — E039 Hypothyroidism, unspecified: Secondary | ICD-10-CM | POA: Diagnosis not present

## 2017-10-02 DIAGNOSIS — I63511 Cerebral infarction due to unspecified occlusion or stenosis of right middle cerebral artery: Secondary | ICD-10-CM | POA: Diagnosis not present

## 2017-10-02 DIAGNOSIS — Z79890 Hormone replacement therapy (postmenopausal): Secondary | ICD-10-CM

## 2017-10-02 DIAGNOSIS — E6609 Other obesity due to excess calories: Secondary | ICD-10-CM | POA: Diagnosis not present

## 2017-10-02 DIAGNOSIS — I69992 Facial weakness following unspecified cerebrovascular disease: Secondary | ICD-10-CM | POA: Diagnosis not present

## 2017-10-02 DIAGNOSIS — Z6833 Body mass index (BMI) 33.0-33.9, adult: Secondary | ICD-10-CM

## 2017-10-02 DIAGNOSIS — Z792 Long term (current) use of antibiotics: Secondary | ICD-10-CM

## 2017-10-02 DIAGNOSIS — R29818 Other symptoms and signs involving the nervous system: Secondary | ICD-10-CM | POA: Diagnosis not present

## 2017-10-02 DIAGNOSIS — Z803 Family history of malignant neoplasm of breast: Secondary | ICD-10-CM | POA: Diagnosis not present

## 2017-10-02 DIAGNOSIS — Z713 Dietary counseling and surveillance: Secondary | ICD-10-CM | POA: Diagnosis not present

## 2017-10-02 DIAGNOSIS — I351 Nonrheumatic aortic (valve) insufficiency: Secondary | ICD-10-CM | POA: Diagnosis not present

## 2017-10-02 DIAGNOSIS — G4733 Obstructive sleep apnea (adult) (pediatric): Secondary | ICD-10-CM | POA: Diagnosis present

## 2017-10-02 DIAGNOSIS — Z85820 Personal history of malignant melanoma of skin: Secondary | ICD-10-CM | POA: Diagnosis not present

## 2017-10-02 DIAGNOSIS — Z9181 History of falling: Secondary | ICD-10-CM

## 2017-10-02 DIAGNOSIS — I110 Hypertensive heart disease with heart failure: Secondary | ICD-10-CM | POA: Diagnosis present

## 2017-10-02 DIAGNOSIS — R4701 Aphasia: Secondary | ICD-10-CM

## 2017-10-02 DIAGNOSIS — I69398 Other sequelae of cerebral infarction: Secondary | ICD-10-CM | POA: Diagnosis not present

## 2017-10-02 DIAGNOSIS — I6300 Cerebral infarction due to thrombosis of unspecified precerebral artery: Secondary | ICD-10-CM | POA: Diagnosis not present

## 2017-10-02 DIAGNOSIS — Z91018 Allergy to other foods: Secondary | ICD-10-CM

## 2017-10-02 DIAGNOSIS — I459 Conduction disorder, unspecified: Secondary | ICD-10-CM | POA: Diagnosis present

## 2017-10-02 DIAGNOSIS — Z881 Allergy status to other antibiotic agents status: Secondary | ICD-10-CM

## 2017-10-02 DIAGNOSIS — D509 Iron deficiency anemia, unspecified: Secondary | ICD-10-CM | POA: Diagnosis present

## 2017-10-02 DIAGNOSIS — Z6831 Body mass index (BMI) 31.0-31.9, adult: Secondary | ICD-10-CM | POA: Diagnosis not present

## 2017-10-02 DIAGNOSIS — R269 Unspecified abnormalities of gait and mobility: Secondary | ICD-10-CM | POA: Diagnosis not present

## 2017-10-02 DIAGNOSIS — R197 Diarrhea, unspecified: Secondary | ICD-10-CM | POA: Diagnosis present

## 2017-10-02 DIAGNOSIS — R2681 Unsteadiness on feet: Secondary | ICD-10-CM | POA: Diagnosis present

## 2017-10-02 DIAGNOSIS — R2689 Other abnormalities of gait and mobility: Secondary | ICD-10-CM | POA: Diagnosis not present

## 2017-10-02 DIAGNOSIS — G4730 Sleep apnea, unspecified: Secondary | ICD-10-CM | POA: Diagnosis not present

## 2017-10-02 DIAGNOSIS — R000 Tachycardia, unspecified: Secondary | ICD-10-CM | POA: Diagnosis present

## 2017-10-02 DIAGNOSIS — R4781 Slurred speech: Secondary | ICD-10-CM | POA: Diagnosis not present

## 2017-10-02 DIAGNOSIS — Z7984 Long term (current) use of oral hypoglycemic drugs: Secondary | ICD-10-CM

## 2017-10-02 DIAGNOSIS — Z9841 Cataract extraction status, right eye: Secondary | ICD-10-CM

## 2017-10-02 DIAGNOSIS — G4752 REM sleep behavior disorder: Secondary | ICD-10-CM | POA: Diagnosis not present

## 2017-10-02 DIAGNOSIS — J309 Allergic rhinitis, unspecified: Secondary | ICD-10-CM | POA: Diagnosis not present

## 2017-10-02 DIAGNOSIS — I63512 Cerebral infarction due to unspecified occlusion or stenosis of left middle cerebral artery: Secondary | ICD-10-CM | POA: Diagnosis not present

## 2017-10-02 LAB — COMPREHENSIVE METABOLIC PANEL
ALK PHOS: 99 U/L (ref 38–126)
ALT: 24 U/L (ref 17–63)
ANION GAP: 10 (ref 5–15)
AST: 21 U/L (ref 15–41)
Albumin: 4 g/dL (ref 3.5–5.0)
BILIRUBIN TOTAL: 0.5 mg/dL (ref 0.3–1.2)
BUN: 28 mg/dL — ABNORMAL HIGH (ref 6–20)
CALCIUM: 8.9 mg/dL (ref 8.9–10.3)
CO2: 24 mmol/L (ref 22–32)
Chloride: 107 mmol/L (ref 101–111)
Creatinine, Ser: 1.32 mg/dL — ABNORMAL HIGH (ref 0.61–1.24)
GFR, EST AFRICAN AMERICAN: 55 mL/min — AB (ref >60)
GFR, EST NON AFRICAN AMERICAN: 47 mL/min — AB (ref >60)
GLUCOSE: 198 mg/dL — AB (ref 65–99)
POTASSIUM: 4.6 mmol/L (ref 3.5–5.1)
Sodium: 141 mmol/L (ref 135–145)
TOTAL PROTEIN: 7.6 g/dL (ref 6.5–8.1)

## 2017-10-02 LAB — CBC
HCT: 37.9 % — ABNORMAL LOW (ref 39.0–52.0)
HEMOGLOBIN: 12 g/dL — AB (ref 13.0–17.0)
MCH: 31.4 pg (ref 26.0–34.0)
MCHC: 31.7 g/dL (ref 30.0–36.0)
MCV: 99.2 fL (ref 78.0–100.0)
Platelets: 294 10*3/uL (ref 150–400)
RBC: 3.82 MIL/uL — AB (ref 4.22–5.81)
RDW: 13.7 % (ref 11.5–15.5)
WBC: 9.5 10*3/uL (ref 4.0–10.5)

## 2017-10-02 LAB — URINALYSIS, ROUTINE W REFLEX MICROSCOPIC
Bilirubin Urine: NEGATIVE
Glucose, UA: NEGATIVE mg/dL
Hgb urine dipstick: NEGATIVE
KETONES UR: NEGATIVE mg/dL
LEUKOCYTES UA: NEGATIVE
NITRITE: NEGATIVE
PH: 5 (ref 5.0–8.0)
PROTEIN: NEGATIVE mg/dL
Specific Gravity, Urine: 1.032 — ABNORMAL HIGH (ref 1.005–1.030)

## 2017-10-02 LAB — DIFFERENTIAL
Basophils Absolute: 0 10*3/uL (ref 0.0–0.1)
Basophils Relative: 0 %
EOS ABS: 0.4 10*3/uL (ref 0.0–0.7)
EOS PCT: 4 %
LYMPHS ABS: 1.7 10*3/uL (ref 0.7–4.0)
LYMPHS PCT: 18 %
MONO ABS: 1 10*3/uL (ref 0.1–1.0)
MONOS PCT: 11 %
Neutro Abs: 6.3 10*3/uL (ref 1.7–7.7)
Neutrophils Relative %: 67 %

## 2017-10-02 LAB — PROTIME-INR
INR: 1.02
Prothrombin Time: 13.3 seconds (ref 11.4–15.2)

## 2017-10-02 LAB — RAPID URINE DRUG SCREEN, HOSP PERFORMED
Amphetamines: NOT DETECTED
Barbiturates: NOT DETECTED
Benzodiazepines: NOT DETECTED
Cocaine: NOT DETECTED
OPIATES: NOT DETECTED
TETRAHYDROCANNABINOL: NOT DETECTED

## 2017-10-02 LAB — APTT: aPTT: 35 seconds (ref 24–36)

## 2017-10-02 LAB — I-STAT TROPONIN, ED: TROPONIN I, POC: 0 ng/mL (ref 0.00–0.08)

## 2017-10-02 MED ORDER — ALTEPLASE (STROKE) FULL DOSE INFUSION
90.0000 mg | Freq: Once | INTRAVENOUS | Status: AC
  Administered 2017-10-02: 90 mg via INTRAVENOUS
  Filled 2017-10-02: qty 100

## 2017-10-02 MED ORDER — SODIUM CHLORIDE 0.9 % IV SOLN
100.0000 mL/h | INTRAVENOUS | Status: DC
  Administered 2017-10-02: 100 mL/h via INTRAVENOUS

## 2017-10-02 MED ORDER — SODIUM CHLORIDE 0.9 % IV BOLUS (SEPSIS): 500.0000 mL | Freq: Once | INTRAVENOUS | Status: DC

## 2017-10-02 MED ORDER — PANTOPRAZOLE SODIUM 40 MG IV SOLR
40.0000 mg | Freq: Every day | INTRAVENOUS | Status: DC
  Administered 2017-10-02 – 2017-10-03 (×2): 40 mg via INTRAVENOUS
  Filled 2017-10-02: qty 40

## 2017-10-02 MED ORDER — ALTEPLASE 100 MG IV SOLR
INTRAVENOUS | Status: AC
  Filled 2017-10-02: qty 100

## 2017-10-02 MED ORDER — STROKE: EARLY STAGES OF RECOVERY BOOK
Freq: Once | Status: AC
  Administered 2017-10-02
  Filled 2017-10-02: qty 1

## 2017-10-02 MED ORDER — IOPAMIDOL (ISOVUE-370) INJECTION 76%
100.0000 mL | Freq: Once | INTRAVENOUS | Status: AC | PRN
  Administered 2017-10-02: 100 mL via INTRAVENOUS

## 2017-10-02 MED ORDER — SODIUM CHLORIDE 0.9 % IV SOLN: 50.0000 mL | Freq: Once | INTRAVENOUS | Status: DC

## 2017-10-02 MED ORDER — SODIUM CHLORIDE 0.9 % IV SOLN: INTRAVENOUS | Status: DC

## 2017-10-02 NOTE — Consult Note (Addendum)
[facility] TeleNeurology Consult Services  Asked to see this patient in telemedicine consultation. Consultation was performed with assistance of ancillary/medical staff at bedside.  Comments: Last Known normal 1730 Door Time: 1936 [facility] Contacted: 1944 [facility] first log in: 1954 NIHSS assessment time: 2012 Needle Time: 2025 Call back time: 2017  HPI:  68 yom with hx of afib on asa d/t falls risk, presents to hospital with confusion, unsteadiness, and slurred speech. Wife states he was doing well until 1730 and after that point he was having confusion.  He was dizzy and unsteady and was having difficulty turning off the t.v.   His speech was garbled and the words he used didn't make sense to the wife. He has not had a stroke prior to tonight sxs and per wife's recollection he is not on a/c. IV tpa was discussed with spouse as a treatment option. Patient has no GI/GU bleeding concerns, no recent surgeries, no head trauma. CT scan head was negative for acute process.   VSS  Gen Wn/Wd in Nad  TeleStroke Assessment: LOC:   0 LOC questions:  1 LOC Commands :   1 Gaze : 0 Visual fields :  0  Facial movements : 0 Upper limb Motor  0 Lower limb Motor  0 Limb Coordination  - 0 Sensory -  - 0 Language -  2 Speech -   0 Neglect / extinction -  0  NIHSS Score: 4    IMPRESSION  Concern for left hemispheric stroke with mixed aphasia Hx of afib on asa  Blood Pressure and Blood Glucose within acceptable parameters.   Medical Decision Making:  Thrombolytic Considered: Yes Patient has been identified as an ischemic stroke patient that is an appropriate candidate for IV tPA  - Risks and benefits of IV tPA discussed - Main risks discussed include bleeding complications occurring in 5-6% of patients; half of those patients have no significant clinical worsening associated with bleeding and the other half do have risk of worsening and/or death -  Approximately 30% of patients who receive tPA demonstrate benefit from treatment, with approximately 13% returning to normal and 20% improved - Medical contraindications reviewed and none are identified - Patient and/or family agree to IV tPA per stroke protocol 0.9 mg/kg IV with 10% bolus and 90% infusion over 1 hour -Bolus time was 2025  Not an IR candidate as low clinical suspicion for LVO by neurologic assessment, however cta report is pending.   Recommendations: 1- Hold Daily antithrombotics for now if repeat ct scan head in 24 hrs without contraindication can start then. 2- Further work up with Stroke labs, MRI brain, ECHO will be deferred to inpt neurology service 3-  Needs Inpatient Neurology consultation and follow up  4- Thank you for allowing [place] to participate in the care of your patient, if there are any questions please don't hesitate to contact us  Discussed plan of care with patient/family/hospital staff   Physician: Sylvan Cheese, DO   [facility]

## 2017-10-02 NOTE — ED Triage Notes (Signed)
Pt in by [facility] from home.  Per ;ems report, pt was watching tv when the wife noted the pt to have some confusion, unable to answer questions.   Pt has a slight right side facial droop

## 2017-10-02 NOTE — Progress Notes (Signed)
Code stroke  [facility] Dr. Alinda Sierras, Alfonso Patten [phone] Patient arrived @  740 Scan began and completed sent to soc @ 0745 Exam ended and radiologist called at 57pm

## 2017-10-02 NOTE — ED Notes (Signed)
[facility] in room

## 2017-10-02 NOTE — ED Provider Notes (Signed)
[facility] EMERGENCY DEPARTMENT Provider Note   CSN: [id number] Arrival date & time: 10/02/17  1936     History   Chief Complaint Chief Complaint  Patient presents with  . Code Stroke    HPI [name] is a 83 y.o. male.  HPI  This elderly male presents from home with concern of speech difficulty and gait difficulty. Patient initially arrives via EMS. Patient is a phasic, cannot provide details of his HPI, nods somewhat consistently to questions, but is incapable of providing much history. Level 5 caveat secondary to acuity of condition. EMS notes that the patient was last seen normal approximately 1 hour prior to ED arrival, at which point he had sudden onset of speech difficulty. During subsequent attempt at walking down the hallway he was noted to have difficulty with gait as well, though he did not fall. EMS reports no family members described recent notable changes in the patient's condition, including no new medication, diet, activity. He has no known history of stroke.  Past Medical History:  Diagnosis Date  . Allergic rhinitis   . Asthmatic bronchitis   . Atrial fibrillation ([place])   . Celiac disease   . Cellulitis   . Diverticulosis   . DM (diabetes mellitus) ([place])   . Fx. left wrist 1987  . Gastric polyps   . GERD (gastroesophageal reflux disease)   . History of pneumonia    w/pleural effusion  . Hypothyroidism   . Iron deficiency anemia   . Melanoma ([place])   . Neuropathy   . Prostate cancer ([place])   . REM sleep behavior disorder   . RLS (restless legs syndrome)   . Sleep apnea     Patient Active Problem List   Diagnosis Date Noted  . Afib ([place]) 01/10/2017  . SOB (shortness of breath) 01/10/2017  . Acute diastolic CHF (congestive heart failure) ([place]) 01/10/2017  . Atrial fibrillation with RVR ([place]) 01/10/2017  . Claudication ([place]) 07/25/2016  . Paroxysmal atrial fibrillation ([place]) 07/25/2016  . Odynophagia 04/10/2014  . Allergic rhinitis  03/23/2014  . Chronic rhinitis 12/22/2013  . 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 Constipation 08/22/2011  . Obesity 09/20/2009  . G E R D 11/29/2007  . Celiac disease 11/29/2007    Past Surgical History:  Procedure Laterality Date  . APPENDECTOMY    . CARPAL TUNNEL RELEASE    . CATARACT EXTRACTION  03/2011   bilateral   . CHOLECYSTECTOMY  2001  . COLONOSCOPY W/ BIOPSIES  04/27/2008   diverticulosis, duodenitis  . FOOT SURGERY    . HERNIA REPAIR  1994   bilateral  . KNEE SURGERY     x 2  . LUNG SURGERY  2001  . PROSTATECTOMY  2002  . UPPER GASTROINTESTINAL ENDOSCOPY  04/27/2008   celiac disease, gastric polyps       Home Medications    Prior to Admission medications   Medication Sig Start Date End Date Taking? Authorizing Provider  Alpha-Lipoic Acid 300 MG CAPS Take 1 capsule by mouth daily.      [provider]  amitriptyline (ELAVIL) 75 MG tablet Take 75 mg by mouth at bedtime.     [provider]  Ascorbic Acid (VITAMIN C) 1000 MG tablet Take 1,000 mg by mouth 2 (two) times daily.      [provider]  aspirin 81 MG tablet Take 81 mg by mouth at bedtime.     [provider]  azelastine (ASTELIN) 0.1 % nasal spray Place 1  spray into both nostrils at bedtime.  04/25/14   [provider]  Cholecalciferol (VITAMIN D3) 1000 UNITS CAPS Take 1 tablet by mouth 3 (three) times daily.     [provider]  Coenzyme Q10 (COQ10) 200 MG CAPS Take 1 capsule by mouth daily.      [provider]  fluticasone (FLONASE) 50 MCG/ACT nasal spray Place 1 spray into both nostrils at bedtime.  07/28/16   [provider]  furosemide (LASIX) 20 MG tablet Take 1 tablet (20 mg total) by mouth daily. 02/27/17   Lorretta Harp, MD  levocetirizine (XYZAL) 5 MG tablet Take 5 mg by mouth at bedtime.  04/21/14   [provider]  levothyroxine (SYNTHROID, LEVOTHROID) 150 MCG tablet Take 150 mcg by mouth daily before breakfast.     [provider]  magnesium gluconate (MAGONATE) 500 MG tablet Take 500 mg by mouth daily.      [provider]  Melatonin 3 MG TABS Take 1 tablet by mouth at bedtime.     [provider]  metoprolol tartrate (LOPRESSOR) 25 MG tablet Take 1 tablet (25 mg total) by mouth 2 (two) times daily. Take 1 tab along with 50 mg tab to equal 75 mg twice daily. 05/15/17 08/13/17  Lorretta Harp, MD  Multiple Vitamin (MULTIVITAMIN) tablet Take 1 tablet by mouth daily.      [provider]  Omega-3 Fatty Acids (FISH OIL) 1000 MG CAPS Take 1 capsule by mouth daily.    [provider]  pantoprazole (PROTONIX) 40 MG tablet Take 40 mg by mouth daily.    [provider]  Red Yeast Rice Extract 600 MG TABS Take by mouth daily.    [provider]  senna (SENOKOT) 8.6 MG tablet Take 1 tablet by mouth daily as needed for constipation.     [provider]  sitaGLIPtin (JANUVIA) 50 MG tablet Take 50 mg by mouth daily.    [provider]  telmisartan (MICARDIS) 20 MG tablet Take 20 mg by mouth daily. 06/01/17   [provider]    Family History Family History  Problem Relation Age of Onset  . Breast cancer Mother   . Kidney failure Father   . Heart disease Father   . Rheumatic fever Father   . Colon cancer Unknown     Social History Social History   Tobacco Use  . Smoking status: Never Smoker  . Smokeless tobacco: Never Used  Substance Use Topics  . Alcohol use: No  . Drug use: No     Allergies   Clindamycin; Hydrocodone; Penicillins; Procaine; Procaine hcl; Sulfonamide derivatives; Cephalexin; and Mold extract [trichophyton]   Review of Systems Review of Systems  Unable to perform ROS: Acuity of condition     Physical Exam Updated Vital Signs BP (!) 148/69   Pulse 74   Temp 97.6 F (36.4 C)   Resp 19   Wt 105.2 kg (232 lb)   SpO2 99%   BMI 33.29 kg/m   Physical Exam  Constitutional: He appears  well-developed.  Uncomfortable appearing thin elderly male  HENT:  Head: Normocephalic and atraumatic.  Eyes: Conjunctivae and EOM are normal.  Cardiovascular: Normal rate and regular rhythm.  Pulmonary/Chest: Effort normal. No stridor. No respiratory distress.  Abdominal: He exhibits no distension.  Musculoskeletal: He exhibits no deformity.  Neurological: He is alert. He displays no atrophy and no tremor. He exhibits normal muscle tone. He displays no seizure activity.  Both expressive  and receptive aphasia with intermittent word salad and garbled speech. Patient answers questions or attempts to answer questions inconsistently, even with nodding when directed to use this as a modality of communication.  Gait exam not performed  Skin: Skin is warm and dry.  Psychiatric: His mood appears anxious. Cognition and memory are impaired.  Nursing note and vitals reviewed.    ED Treatments / Results  Labs (all labs ordered are listed, but only abnormal results are displayed) Labs Reviewed  CBC - Abnormal; Notable for the following components:      Result Value   RBC 3.82 (*)    Hemoglobin 12.0 (*)    HCT 37.9 (*)    All other components within normal limits  COMPREHENSIVE METABOLIC PANEL - Abnormal; Notable for the following components:   Glucose, Bld 198 (*)    BUN 28 (*)    Creatinine, Ser 1.32 (*)    GFR calc non Af Amer 47 (*)    GFR calc Af Amer 55 (*)    All other components within normal limits  PROTIME-INR  APTT  DIFFERENTIAL  RAPID URINE DRUG SCREEN, HOSP PERFORMED  URINALYSIS, ROUTINE W REFLEX MICROSCOPIC  I-STAT TROPONIN, ED    EKG  EKG Interpretation  Date/Time:  Friday October 02 2017 20:08:50 EST Ventricular Rate:  83 PR Interval:    QRS Duration: 150 QT Interval:  425 QTC Calculation: 500 R Axis:   74 Text Interpretation:  Sinus rhythm Prolonged PR interval Consider left atrial enlargement Right bundle branch block No significant change since last  tracing Abnormal ekg Confirmed by Carmin Muskrat [id number]) on 10/02/2017 8:14:00 PM       Radiology Ct Angio Head W Or Wo Contrast  Result Date: 10/02/2017 CLINICAL DATA:  LEFT-sided weakness and slurred speech that began a few hours earlier. EXAM: CT ANGIOGRAPHY HEAD TECHNIQUE: Multidetector CT imaging of the head was performed using the standard protocol during bolus administration of intravenous contrast. Multiplanar CT image reconstructions and MIPs were obtained to evaluate the vascular anatomy. CONTRAST:  145mL ISOVUE-370 IOPAMIDOL (ISOVUE-370) INJECTION 76% COMPARISON:  Code stroke CT head was negative. FINDINGS: CTA HEAD Anterior circulation: No significant stenosis, proximal occlusion, aneurysm, or vascular malformation. Posterior circulation: No significant stenosis, proximal occlusion, aneurysm, or vascular malformation. The RIGHT vertebral is non dominant, and supplies primarily the RIGHT posterior inferior cerebellar artery. The basilar is mildly hypoplastic and is supplied primarily by the LEFT vertebral. There is non stenotic calcification of LEFT vertebral all in its proximal V4 segment Venous sinuses: As permitted by contrast timing, patent. Anatomic variants: Both posterior cerebral arteries originate from the internal carotids. This is a normal variant. Delayed phase: No abnormal intracranial enhancement. IMPRESSION: No flow reducing intracranial stenosis is observed. No abnormal postcontrast enhancement. MRI provides increased sensitivity in the detection of acute infarction, and could be performed as clinically indicated. Electronically Signed   By: Staci Righter M.D.   On: 10/02/2017 20:34   Ct Head Code Stroke Wo Contrast  Result Date: 10/02/2017 CLINICAL DATA:  Code stroke. LEFT-sided weakness began earlier today. EXAM: CT HEAD WITHOUT CONTRAST TECHNIQUE: Contiguous axial images were obtained from the base of the skull through the vertex without intravenous contrast. COMPARISON:   None. FINDINGS: Brain: No evidence for acute infarction, hemorrhage, mass lesion, hydrocephalus, or extra-axial fluid. Generalized atrophy. Hypoattenuation of white matter, likely small vessel disease. Vascular: Calcification of the cavernous internal carotid arteries consistent with cerebrovascular atherosclerotic disease. No signs of intracranial large vessel occlusion. Skull: Normal. Negative  for fracture or focal lesion. Sinuses/Orbits: No acute finding.  BILATERAL cataract extraction. Other: Calcified scalp sebaceous cyst. ASPECTS ([place] Stroke Program Early CT Score) - Ganglionic level infarction (caudate, lentiform nuclei, internal capsule, insula, M1-M3 cortex): 7 - Supraganglionic infarction (M4-M6 cortex): 3 Total score (0-10 with 10 being normal): 10 IMPRESSION: 1. Atrophy and small vessel disease. No acute intracranial findings. 2. ASPECTS is 10 These results were called by telephone at the time of interpretation on 10/02/2017 at 7:52 pm to Dr. Carmin Muskrat , who verbally acknowledged these results. Electronically Signed   By: Staci Righter M.D.   On: 10/02/2017 19:54    Procedures Procedures (including critical care time)  Medications Ordered in ED Medications  sodium chloride 0.9 % bolus 500 mL (not administered)    Followed by  0.9 %  sodium chloride infusion (not administered)  alteplase (ACTIVASE) 1 mg/mL infusion 90 mg (90 mg Intravenous New Bag/Given 10/02/17 2028)    Followed by  0.9 %  sodium chloride infusion (not administered)  iopamidol (ISOVUE-370) 76 % injection 100 mL (100 mLs Intravenous Contrast Given 10/02/17 2005)     Initial Impression / Assessment and Plan / ED Course  I have reviewed the triage vital signs and the nursing notes.  Pertinent labs & imaging results that were available during my care of the patient were reviewed by me and considered in my medical decision making (see chart for details).   Update:, Patient back from CT, he has persistent  aphasia. He has been evaluated by our neurology service on call, and I discussed his case with neurology as well. Patient will receive TPA.   8:48 PM Patient receiving TPA, no appreciable change in his condition.   I discussed patient's case with our neurologist at [facility]. Patient will require transfer to the neurologic ICU.  This elderly male presents after sudden onset speech difficulty, gait difficulty. The patient is awake and alert, though he has clear speech dysfunction, with concern for both receptive and expressive aphasia. Given this patient began TPA therapy, required transfer to our stroke center.  Final Clinical Impressions(s) / ED Diagnoses  Aphasia  CRITICAL CARE Performed by: Carmin Muskrat Total critical care time: 35 minutes Critical care time was exclusive of separately billable procedures and treating other patients. Critical care was necessary to treat or prevent imminent or life-threatening deterioration. Critical care was time spent personally by me on the following activities: development of treatment plan with patient and/or surrogate as well as nursing, discussions with consultants, evaluation of patient's response to treatment, examination of patient, obtaining history from patient or surrogate, ordering and performing treatments and interventions, ordering and review of laboratory studies, ordering and review of radiographic studies, pulse oximetry and re-evaluation of patient's condition.    Carmin Muskrat, MD 10/02/17 2053

## 2017-10-02 NOTE — ED Notes (Signed)
Stroke TP-A entered in error

## 2017-10-02 NOTE — H&P (Signed)
Chief Complaint:  Aphasia   History obtained from: Patient and Chart     HPI:                                                                                                                                       [name] is an 83 y.o. male Caucasian right-handed past medical history of atrial fibrillation not on anticoagulation, diabetes, hypothyroidism presented to [facility] ER as a stroke alert with aphasia.  He was last normal around 5:30 PM when he was watching TV. His wife noticed he was having difficulty turning off the TV and unsteady on his feet. He tried talking to her but did not make any sense. He was immediately taken to [facility] and tele neurology was consulted. Patient received tPA at 8.25 pm. A CT angiogram of the head was performed which showed no large vessel occlusion. Patient was transferred to [facility] for further management.   He arrived around 53 PM and his symptoms had improved but still  aphasic more expressive and comprehensive. No weakness was noted on exam. Wife states that he was diagnosed  atrial fibrillation about one year ago but was not started on anticoagulation by his cardiologist as he had multiple falls this year.  Date last known well: 12.21.18 Time last known well: 5.30 pm  tPA Given: yes NIHSS: 4 Baseline MRS 0    Past Medical History:  Diagnosis Date  . Allergic rhinitis   . Asthmatic bronchitis   . Atrial fibrillation ([place])   . Celiac disease   . Cellulitis   . Diverticulosis   . DM (diabetes mellitus) ([place])   . Fx. left wrist 1987  . Gastric polyps   . GERD (gastroesophageal reflux disease)   . History of pneumonia    w/pleural effusion  . Hypothyroidism   . Iron deficiency anemia   . Melanoma ([place])   . Neuropathy   . Prostate cancer ([place])   . REM sleep behavior disorder   . RLS (restless legs syndrome)   . Sleep apnea     Past Surgical History:  Procedure Laterality Date  . APPENDECTOMY    . CARPAL TUNNEL RELEASE     . CATARACT EXTRACTION  03/2011   bilateral   . CHOLECYSTECTOMY  2001  . COLONOSCOPY W/ BIOPSIES  04/27/2008   diverticulosis, duodenitis  . FOOT SURGERY    . HERNIA REPAIR  1994   bilateral  . KNEE SURGERY     x 2  . LUNG SURGERY  2001  . PROSTATECTOMY  2002  . UPPER GASTROINTESTINAL ENDOSCOPY  04/27/2008   celiac disease, gastric polyps    Family History  Problem Relation Age of Onset  . Breast cancer Mother   . Kidney failure Father   . Heart disease Father   . Rheumatic fever Father   . Colon cancer Unknown    Social History:  reports that  has never smoked. he has never used smokeless tobacco. He reports that he does not drink alcohol or use drugs.  Allergies:  Allergies  Allergen Reactions  . Clindamycin Diarrhea  . Hydrocodone   . Penicillins     Has patient had a PCN reaction causing immediate rash, facial/tongue/throat swelling, SOB or lightheadedness with hypotension: No Has patient had a PCN reaction causing severe rash involving mucus membranes or skin necrosis: No Has patient had a PCN reaction that required hospitalization No Has patient had a PCN reaction occurring within the last 10 years: No If all of the above answers are "NO", then may proceed with Cephalosporin use.   . Procaine Other (See Comments)    "Passes out"  . Procaine Hcl   . Sulfonamide Derivatives   . Wheat Bran     Pt family states pt has celiac disease  . Cephalexin Nausea And Vomiting    Dry hives, "violent" diarhea  . Mold Extract [Trichophyton] Other (See Comments)    Stuffiness, post nasal drip    Medications:                                                                                                                        I reviewed home medications   ROS:                                                                                                                                     14 systems reviewed and negative except above   Examination:                                                                                                       General: Appears well-developed and well-nourished.  Psych: Affect appropriate to situation Eyes: No scleral injection HENT: No OP obstrucion Head: Normocephalic.  Cardiovascular: Normal rate and regular rhythm.  Respiratory: Effort normal and breath sounds normal to anterior ascultation GI: Soft.  No distension. There is  no tenderness.  Skin: WDI   Neurological Examination Mental Status: Alert, oriented, can state his name and that he is in the hospital. Expressive aphasia and unable to read and complete sentences. Comprehensive somewhat intact and can follow one-step commands.   Cranial Nerves: II: Visual fields grossly normal,  III,IV, VI: ptosis not present, extra-ocular motions intact bilaterally, pupils equal, round, reactive to light and accommodation V,VII: smile symmetric, facial light touch sensation normal bilaterally VIII: hearing normal bilaterally IX,X: uvula rises symmetrically XI: bilateral shoulder shrug XII: midline tongue extension Motor: Right : Upper extremity   5/5    Left:     Upper extremity   5/5  Lower extremity   5/5     Lower extremity   5/5 Tone and bulk:normal tone throughout; no atrophy noted Sensory: Pinprick and light touch intact throughout, bilaterally Deep Tendon Reflexes: 2+ and symmetric throughout Plantars: Right: downgoing   Left: downgoing Cerebellar: normal finger-to-nose, normal rapid alternating movements and normal heel-to-shin test Gait: Did not assess    Lab Results: Basic Metabolic Panel: Recent Labs  Lab 10/02/17 1942  NA 141  K 4.6  CL 107  CO2 24  GLUCOSE 198*  BUN 28*  CREATININE 1.32*  CALCIUM 8.9    CBC: Recent Labs  Lab 10/02/17 1942  WBC 9.5  NEUTROABS 6.3  HGB 12.0*  HCT 37.9*  MCV 99.2  PLT 294    Coagulation Studies: Recent Labs    10/02/17 1942  LABPROT 13.3  INR 1.02    Imaging: Ct Angio Head W Or Wo  Contrast  Result Date: 10/02/2017 CLINICAL DATA:  LEFT-sided weakness and slurred speech that began a few hours earlier. EXAM: CT ANGIOGRAPHY HEAD TECHNIQUE: Multidetector CT imaging of the head was performed using the standard protocol during bolus administration of intravenous contrast. Multiplanar CT image reconstructions and MIPs were obtained to evaluate the vascular anatomy. CONTRAST:  146mL ISOVUE-370 IOPAMIDOL (ISOVUE-370) INJECTION 76% COMPARISON:  Code stroke CT head was negative. FINDINGS: CTA HEAD Anterior circulation: No significant stenosis, proximal occlusion, aneurysm, or vascular malformation. Posterior circulation: No significant stenosis, proximal occlusion, aneurysm, or vascular malformation. The RIGHT vertebral is non dominant, and supplies primarily the RIGHT posterior inferior cerebellar artery. The basilar is mildly hypoplastic and is supplied primarily by the LEFT vertebral. There is non stenotic calcification of LEFT vertebral all in its proximal V4 segment Venous sinuses: As permitted by contrast timing, patent. Anatomic variants: Both posterior cerebral arteries originate from the internal carotids. This is a normal variant. Delayed phase: No abnormal intracranial enhancement. IMPRESSION: No flow reducing intracranial stenosis is observed. No abnormal postcontrast enhancement. MRI provides increased sensitivity in the detection of acute infarction, and could be performed as clinically indicated. Electronically Signed   By: Staci Righter M.D.   On: 10/02/2017 20:34   Ct Head Code Stroke Wo Contrast  Result Date: 10/02/2017 CLINICAL DATA:  Code stroke. LEFT-sided weakness began earlier today. EXAM: CT HEAD WITHOUT CONTRAST TECHNIQUE: Contiguous axial images were obtained from the base of the skull through the vertex without intravenous contrast. COMPARISON:  None. FINDINGS: Brain: No evidence for acute infarction, hemorrhage, mass lesion, hydrocephalus, or extra-axial fluid.  Generalized atrophy. Hypoattenuation of white matter, likely small vessel disease. Vascular: Calcification of the cavernous internal carotid arteries consistent with cerebrovascular atherosclerotic disease. No signs of intracranial large vessel occlusion. Skull: Normal. Negative for fracture or focal lesion. Sinuses/Orbits: No acute finding.  BILATERAL cataract extraction. Other: Calcified scalp sebaceous cyst. ASPECTS [facility] Stroke Program Early CT  Score) - Ganglionic level infarction (caudate, lentiform nuclei, internal capsule, insula, M1-M3 cortex): 7 - Supraganglionic infarction (M4-M6 cortex): 3 Total score (0-10 with 10 being normal): 10 IMPRESSION: 1. Atrophy and small vessel disease. No acute intracranial findings. 2. ASPECTS is 10 These results were called by telephone at the time of interpretation on 10/02/2017 at 7:52 pm to Dr. Carmin Muskrat , who verbally acknowledged these results. Electronically Signed   By: Staci Righter M.D.   On: 10/02/2017 19:54     ASSESSMENT AND PLAN   Acute Ischemic Stroke   Risk factors: Afib, DM Etiology: cardioembolic  Plan # MRI of the brain without contrast #Carotid US #Transthoracic Echo  #Hold antiplatelets until repeat CT head in 24 hours, if small size stroke then start patient on anticoagulation. If moderate size,  hold anticoagulation for 3-5 days. #Start or continue Atorvastatin 80 mg/other high intensity statin # BP goal: permissive HTN upto 741 systolic # HBAIC and Lipid profile # Telemetry monitoring # Frequent neuro checks # NPO until passes stroke swallow screen   Diabetes A1c ordered Hold oral hypoglycemics Sliding scale insulin ordered   Atrial fibrillation Metoprolol IV when necessary ordered Will need to start anticoagulation prior to discharge  Carbuncle Resume home doxycycline starting tomorrow   Annistyn Depass [facility] Pager Number [phone]

## 2017-10-03 ENCOUNTER — Inpatient Hospital Stay (HOSPITAL_COMMUNITY): Payer: PPO

## 2017-10-03 ENCOUNTER — Encounter (HOSPITAL_COMMUNITY): Payer: Self-pay | Admitting: Radiology

## 2017-10-03 DIAGNOSIS — I63511 Cerebral infarction due to unspecified occlusion or stenosis of right middle cerebral artery: Secondary | ICD-10-CM

## 2017-10-03 DIAGNOSIS — I351 Nonrheumatic aortic (valve) insufficiency: Secondary | ICD-10-CM

## 2017-10-03 LAB — ECHOCARDIOGRAM COMPLETE
HEIGHTINCHES: 72 in
WEIGHTICAEL: 3633.18 [oz_av]

## 2017-10-03 LAB — GLUCOSE, CAPILLARY
GLUCOSE-CAPILLARY: 199 mg/dL — AB (ref 65–99)
GLUCOSE-CAPILLARY: 145 mg/dL — AB (ref 65–99)
Glucose-Capillary: 153 mg/dL — ABNORMAL HIGH (ref 65–99)
Glucose-Capillary: 134 mg/dL — ABNORMAL HIGH (ref 65–99)

## 2017-10-03 LAB — LIPID PANEL
Cholesterol: 160 mg/dL (ref 0–200)
HDL: 37 mg/dL — ABNORMAL LOW (ref >40)
LDL CALC: 90 mg/dL (ref 0–99)
Total CHOL/HDL Ratio: 4.3 RATIO
Triglycerides: 164 mg/dL — ABNORMAL HIGH (ref <150)
VLDL: 33 mg/dL (ref 0–40)

## 2017-10-03 LAB — HEMOGLOBIN A1C
HEMOGLOBIN A1C: 7.8 % — AB (ref 4.8–5.6)
Mean Plasma Glucose: 177.16 mg/dL

## 2017-10-03 LAB — MRSA PCR SCREENING: MRSA BY PCR: NEGATIVE

## 2017-10-03 MED ORDER — ASPIRIN EC 81 MG PO TBEC
81.0000 mg | DELAYED_RELEASE_TABLET | Freq: Every day | ORAL | Status: DC
  Administered 2017-10-03 – 2017-10-06 (×4): 81 mg via ORAL
  Filled 2017-10-03 (×4): qty 1

## 2017-10-03 MED ORDER — HEPARIN SODIUM (PORCINE) 5000 UNIT/ML IJ SOLN
5000.0000 [IU] | Freq: Three times a day (TID) | INTRAMUSCULAR | Status: DC
  Administered 2017-10-03 – 2017-10-05 (×6): 5000 [IU] via SUBCUTANEOUS
  Filled 2017-10-03 (×7): qty 1

## 2017-10-03 MED ORDER — INSULIN ASPART 100 UNIT/ML ~~LOC~~ SOLN
0.0000 [IU] | Freq: Three times a day (TID) | SUBCUTANEOUS | Status: DC
  Administered 2017-10-03: 1 [IU] via SUBCUTANEOUS
  Administered 2017-10-03 (×2): 2 [IU] via SUBCUTANEOUS
  Administered 2017-10-04: 1 [IU] via SUBCUTANEOUS
  Administered 2017-10-04: 2 [IU] via SUBCUTANEOUS
  Administered 2017-10-04 – 2017-10-06 (×4): 1 [IU] via SUBCUTANEOUS
  Administered 2017-10-06: 2 [IU] via SUBCUTANEOUS
  Administered 2017-10-06: 1 [IU] via SUBCUTANEOUS
  Administered 2017-10-07: 2 [IU] via SUBCUTANEOUS

## 2017-10-03 MED ORDER — PERFLUTREN LIPID MICROSPHERE
1.0000 mL | INTRAVENOUS | Status: AC | PRN
  Administered 2017-10-03: 4 mL via INTRAVENOUS
  Filled 2017-10-03: qty 10

## 2017-10-03 MED ORDER — LORAZEPAM 2 MG/ML IJ SOLN
0.5000 mg | Freq: Once | INTRAMUSCULAR | Status: AC
  Administered 2017-10-03: 0.5 mg via INTRAVENOUS
  Filled 2017-10-03: qty 1

## 2017-10-03 NOTE — Progress Notes (Signed)
OT Cancellation Note  Patient Details Name: [name] MRN: [id number] DOB: 1928/11/21   Cancelled Treatment:    Reason Eval/Treat Not Completed: Medical issues which prohibited therapy (pt on bedrest)    Almon Register [phone] 10/03/2017, 9:08 AM

## 2017-10-03 NOTE — Progress Notes (Signed)
  Echocardiogram 2D Echocardiogram with definity has been performed.  [name] M 10/03/2017, 9:34 AM

## 2017-10-03 NOTE — Progress Notes (Signed)
STROKE TEAM PROGRESS NOTE   HISTORY OF PRESENT ILLNESS (per record) [name] is an 83 y.o. male Caucasian right-handed past medical history of atrial fibrillation not on anticoagulation, diabetes, and hypothyroidism presented to [facility] ER as a stroke alert with aphasia.  He was last normal around 5:30 PM when he was watching TV. His wife noticed he was having difficulty turning off the TV and unsteady on his feet. He tried talking to her but did not make any sense. He was immediately taken to [facility] and tele neurology was consulted. Patient received tPA at 8.25 pm. A CT angiogram of the head was performed which showed no large vessel occlusion. Patient was transferred to [facility] for further management.   He arrived around 67 PM and his symptoms had improved but still  aphasic more expressive and comprehensive. No weakness was noted on exam. Wife states that he was diagnosed  atrial fibrillation about one year ago but was not started on anticoagulation by his cardiologist as he had multiple falls this year.  Date last known well: 12.21.18 Time last known well: 5.30 pm  tPA Given: yes NIHSS: 4 Baseline MRS 0     SUBJECTIVE (INTERVAL HISTORY) His wife and family at bedside. Discussed patient's condition, he is improving but still having difficulty with both expressive and receptive aphasia. MRI pending.   OBJECTIVE Temp:  [97.5 F (36.4 C)-97.8 F (36.6 C)] 97.6 F (36.4 C) (12/22 0400) Pulse Rate:  [61-118] 104 (12/22 0700) Cardiac Rhythm: Atrial fibrillation (12/22 0348) Resp:  [12-32] 17 (12/22 0700) BP: (109-159)/(49-82) 127/57 (12/22 0700) SpO2:  [89 %-100 %] 98 % (12/22 0700) Weight:  [227 lb 1.2 oz (103 kg)-232 lb (105.2 kg)] 227 lb 1.2 oz (103 kg) (12/21 2300)  CBC:  Recent Labs  Lab 10/02/17 1942  WBC 9.5  NEUTROABS 6.3  HGB 12.0*  HCT 37.9*  MCV 99.2  PLT 056    Basic Metabolic Panel:  Recent Labs  Lab 10/02/17 1942  NA 141  K 4.6   CL 107  CO2 24  GLUCOSE 198*  BUN 28*  CREATININE 1.32*  CALCIUM 8.9    Lipid Panel:     Component Value Date/Time   CHOL  02/28/2009 1631    154        ATP III CLASSIFICATION:  <200     mg/dL   Desirable  200-239  mg/dL   Borderline High  >=240    mg/dL   High          TRIG 296 (H) 02/28/2009 1631   HDL 23 (L) 02/28/2009 1631   CHOLHDL 6.7 02/28/2009 1631   VLDL 59 (H) 02/28/2009 1631   LDLCALC  02/28/2009 1631    72        Total Cholesterol/HDL:CHD Risk Coronary Heart Disease Risk Table                     Men   Women  1/2 Average Risk   3.4   3.3  Average Risk       5.0   4.4  2 X Average Risk   9.6   7.1  3 X Average Risk  23.4   11.0        Use the calculated Patient Ratio above and the CHD Risk Table to determine the patient's CHD Risk.        ATP III CLASSIFICATION (LDL):  <100     mg/dL   Optimal  100-129  mg/dL   Near or Above                    Optimal  130-159  mg/dL   Borderline  160-189  mg/dL   High  >190     mg/dL   Very High   HgbA1c:  Lab Results  Component Value Date   HGBA1C (H) 02/28/2009    6.6 (NOTE) The ADA recommends the following therapeutic goal for glycemic control related to Hgb A1c measurement: Goal of therapy: <6.5 Hgb A1c  Reference: American Diabetes Association: Clinical Practice Recommendations 2010, Diabetes Care, 2010, 33: (Suppl  1).   Urine Drug Screen:     Component Value Date/Time   LABOPIA NONE DETECTED 10/02/2017 2119   COCAINSCRNUR NONE DETECTED 10/02/2017 2119   LABBENZ NONE DETECTED 10/02/2017 2119   AMPHETMU NONE DETECTED 10/02/2017 2119   THCU NONE DETECTED 10/02/2017 2119   LABBARB NONE DETECTED 10/02/2017 2119    Alcohol Level No results found for: ETH  IMAGING  Ct Angio Head W Or Wo Contrast 10/02/2017 IMPRESSION:  No flow reducing intracranial stenosis is observed. No abnormal postcontrast enhancement. MRI provides increased sensitivity in the detection of acute infarction, and could be performed  as clinically indicated.    Ct Head Code Stroke Wo Contrast 10/02/2017 IMPRESSION:  1. Atrophy and small vessel disease. No acute intracranial findings.  2. ASPECTS is 10     MR Brain Wo Contrast - pending   CT Head Wo Contrast - pending    Transthoracic Echocardiogram - pending 00/00/00    Bilateral Carotid Dopplers - pending 00/00/00     PHYSICAL EXAM Vitals:   10/03/17 0600 10/03/17 0615 10/03/17 0630 10/03/17 0700  BP: 132/65 (!) 130/57 (!) 116/56 (!) 127/57  Pulse: 86 (!) 118 87 (!) 104  Resp: 17 (!) 21 15 17   Temp:      TempSrc:      SpO2: 96% 97% 97% 98%  Weight:      Height:        PHYSICAL EXAM Physical exam: Exam: Gen: NAD Eyes: anicteric sclerae, moist conjunctivae                    CV: no MRG, no carotid bruits, no peripheral edema Mental Status: Alert, follows simple commands, pleasant  Neuro: Detailed Neurologic Exam  Speech:    Mild receptive and expressive aphasia  Cranial Nerves:    The pupils are equal, round, and reactive to light.. Attempted, Fundi not visualized.  EOMI. No gaze preference. Visual fields full. Face symmetric, Tongue midline. Hearing intact to voice. Shoulder shrug intact  Motor Observation:    no involuntary movements noted. Tone appears normal.     Strength:    5/5     Sensation:  Intact to LT  Plantars downgoing.     ASSESSMENT/PLAN [name] is a 83 y.o. male with history of sleep apnea, prostate cancer, neuropathy, hypothyroidism, diabetes mellitus, and atrial fibrillation without anticoagulation secondary to multiple falls presenting with aphasia.  tPA 10/02/2017 at 8.25 pm  Stroke:  Follow-up imaging pending  Resultant  Mild mixed aphasia  CT head - Atrophy and small vessel disease. No acute intracranial findings.   MRI head - pending  MRA head - not performed  CTA Head - No flow reducing intracranial stenosis is observed.   Carotid Doppler - pending  2D Echo -  pending  LDL - pending  HgbA1c - pending  VTE prophylaxis -  SCDs  Diet NPO time specified  aspirin 81 mg daily prior to admission, now on No antithrombotic S/P TPA  Patient will be counseled to be compliant with his antithrombotic medications  Ongoing aggressive stroke risk factor management  Therapy recommendations:  pending  Disposition:  Pending  Hypertension  Stable  Permissive hypertension (OK if < 220/120) but gradually normalize in 5-7 days  Long-term BP goal normotensive  Hyperlipidemia  Home meds:  No lipid lowering medications prior to admission  LDL pending, goal < 70   Diabetes  HgbA1c pending, goal < 7.0  Unc / Controlled  Other Stroke Risk Factors  Advanced age  Obesity, Body mass index is 30.8 kg/m., recommend weight loss, diet and exercise as appropriate   Obstructive sleep apnea  Atrial fibrillation  Other Active Problems  Kidney disease - BUN 28 ; creatinine 1.32   Plan / Recommendations  Resume antiplatelet therapy if follow-up imaging is without hemorrhage  Consider anticoagulation long-term   Hospital day # 1  Personally  participated in, made any corrections needed, and agree with history, physical, neuro exam,assessment and plan as stated above.     Sarina Ill, MD [facility]     To contact Stroke Continuity provider, please refer to [email or web address] After hours, contact General Neurology

## 2017-10-03 NOTE — Evaluation (Signed)
Clinical/Bedside Swallow Evaluation Patient Details  Name: [name] MRN: [id number] Date of Birth: Jun 28, 1929  Today's Date: 10/03/2017 Time: SLP Start Time (ACUTE ONLY): 8144 SLP Stop Time (ACUTE ONLY): 1015 SLP Time Calculation (min) (ACUTE ONLY): 23 min  Past Medical History:  Past Medical History:  Diagnosis Date  . Allergic rhinitis   . Asthmatic bronchitis   . Atrial fibrillation ([place])   . Celiac disease   . Cellulitis   . Diverticulosis   . DM (diabetes mellitus) ([place])   . Fx. left wrist 1987  . Gastric polyps   . GERD (gastroesophageal reflux disease)   . History of pneumonia    w/pleural effusion  . Hypothyroidism   . Iron deficiency anemia   . Melanoma ([place])   . Neuropathy   . Prostate cancer ([place])   . REM sleep behavior disorder   . RLS (restless legs syndrome)   . Sleep apnea    Past Surgical History:  Past Surgical History:  Procedure Laterality Date  . APPENDECTOMY    . CARPAL TUNNEL RELEASE    . CATARACT EXTRACTION  03/2011   bilateral   . CHOLECYSTECTOMY  2001  . COLONOSCOPY W/ BIOPSIES  04/27/2008   diverticulosis, duodenitis  . FOOT SURGERY    . HERNIA REPAIR  1994   bilateral  . KNEE SURGERY     x 2  . LUNG SURGERY  2001  . PROSTATECTOMY  2002  . UPPER GASTROINTESTINAL ENDOSCOPY  04/27/2008   celiac disease, gastric polyps   HPI:  83 y.o.male with PMH of atrial fibrillation not on anticoagulation, diabetes, hypothyroidismpresented to [facility] as a stroke alert with aphasia; received tPA; transferred to [facility]. MRI pending.    Assessment / Plan / Recommendation Clinical Impression  Pt presents with normal oropharyngeal swallow with no focal deficits, adequate mastication, brisk swallow response, no overt s/s of aspiration.  Recommend resuming a regular diet, thin liquids; no SLP f/u for swallow warranted.  SLP Visit Diagnosis: Dysphagia, unspecified (R13.10)    Aspiration Risk  No limitations    Diet Recommendation   regular  diet, gluten-free - thin liquids  Medication Administration: Whole meds with liquid    Other  Recommendations Oral Care Recommendations: Oral care BID   Follow up Recommendations None      Frequency and Duration            Prognosis        Swallow Study   General Date of Onset: 10/02/17 HPI: 83 y.o.male with PMH of atrial fibrillation not on anticoagulation, diabetes, hypothyroidismpresented to [facility] as a stroke alert with aphasia; received tPA; transferred to [facility]. MRI pending.  Type of Study: Bedside Swallow Evaluation Diet Prior to this Study: NPO Temperature Spikes Noted: No Respiratory Status: Room air History of Recent Intubation: No Behavior/Cognition: Alert Oral Cavity Assessment: Within Functional Limits Oral Care Completed by SLP: No Oral Cavity - Dentition: Adequate natural dentition Vision: Functional for self-feeding Self-Feeding Abilities: Able to feed self Patient Positioning: Upright in bed Baseline Vocal Quality: Normal Volitional Cough: Strong Volitional Swallow: Able to elicit    Oral/Motor/Sensory Function Overall Oral Motor/Sensory Function: Within functional limits   Ice Chips Ice chips: Within functional limits   Thin Liquid Thin Liquid: Within functional limits    Nectar Thick Nectar Thick Liquid: Not tested   Honey Thick Honey Thick Liquid: Not tested   Puree Puree: Within functional limits   Solid   GO   Solid: Within functional limits  [name] 10/03/2017,10:57 AM

## 2017-10-03 NOTE — Progress Notes (Addendum)
Interim note  Reviewed MRI Brain and CT head Has mild petechial hemorrhage noted on MRI brain with CT head showing no frank hyperdensity suggesting hemorrhage. Will start ASA 81mg  and sq heparin. Would wait 5 -7 days before starting AC.

## 2017-10-03 NOTE — Evaluation (Signed)
Speech Language Pathology Evaluation Patient Details Name: [name] MRN: [id number] DOB: 03-04-1929 Today's Date: 10/03/2017 Time: 2956-2130 SLP Time Calculation (min) (ACUTE ONLY): 37 min  Problem List:  Patient Active Problem List   Diagnosis Date Noted  . 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 Afib ([place]) 01/10/2017  . SOB (shortness of breath) 01/10/2017  . Acute diastolic CHF (congestive heart failure) ([place]) 01/10/2017  . Atrial fibrillation with RVR ([place]) 01/10/2017  . Claudication ([place]) 07/25/2016  . Paroxysmal atrial fibrillation ([place]) 07/25/2016  . Odynophagia 04/10/2014  . Allergic rhinitis 03/23/2014  . Chronic rhinitis 12/22/2013  . 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 Constipation 08/22/2011  . Obesity 09/20/2009  . G E R D 11/29/2007  . Celiac disease 11/29/2007   Past Medical History:  Past Medical History:  Diagnosis Date  . Allergic rhinitis   . Asthmatic bronchitis   . Atrial fibrillation ([place])   . Celiac disease   . Cellulitis   . Diverticulosis   . DM (diabetes mellitus) ([place])   . Fx. left wrist 1987  . Gastric polyps   . GERD (gastroesophageal reflux disease)   . History of pneumonia    w/pleural effusion  . Hypothyroidism   . Iron deficiency anemia   . Melanoma ([place])   . Neuropathy   . Prostate cancer ([place])   . REM sleep behavior disorder   . RLS (restless legs syndrome)   . Sleep apnea    Past Surgical History:  Past Surgical History:  Procedure Laterality Date  . APPENDECTOMY    . CARPAL TUNNEL RELEASE    . CATARACT EXTRACTION  03/2011   bilateral   . CHOLECYSTECTOMY  2001  . COLONOSCOPY W/ BIOPSIES  04/27/2008   diverticulosis, duodenitis  . FOOT SURGERY    . HERNIA REPAIR  1994   bilateral  . KNEE SURGERY     x 2  . LUNG SURGERY  2001  . PROSTATECTOMY  2002  . UPPER GASTROINTESTINAL ENDOSCOPY  04/27/2008   celiac disease, gastric polyps   HPI:  83 y.o.male with PMH of atrial fibrillation not on anticoagulation, diabetes,  hypothyroidismpresented to [facility] ER as a stroke alert with aphasia; received tPA; transferred to [facility]. MRI pending.    Assessment / Plan / Recommendation Clinical Impression  Pt presents with a moderate Broca's type aphasia with expressive>receptive deficits.  Social communication is preserved; expression is often limited to vague expressions with heavy use of functor words but limited use of nouns/verbs.  Pt follows commands in predictable contexts, but breakdown in comprehension is apparent when pt has no contextual cues on which to rely. He has difficulty identifying body parts; written cues are an effective means of helping understanding.  Phonemic paraphasias are present; repetition impaired with complex single words.  Spent time talking with family about the nature of aphasia and the best ways to help [name] communicate at present.  Recommend acute care SLP to address aphasia and ongoing family education.  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    SLP Assessment  SLP Recommendation/Assessment: Patient needs continued Speech Lanaguage Pathology Services SLP Visit Diagnosis: Aphasia (R47.01)    Follow Up Recommendations  Other (comment)(tba)    Frequency and Duration min 3x week  2 weeks      SLP Evaluation Cognition  Overall Cognitive Status: Impaired/Different from baseline Orientation Level: Oriented X4 Attention: Focused Focused Attention: Appears intact  Comprehension  Auditory Comprehension Overall Auditory Comprehension: Impaired Yes/No Questions: Impaired Basic Immediate Environment Questions: 75-100% accurate Commands: Impaired One Step Basic Commands: 50-74% accurate Conversation: Simple Reading Comprehension Reading Status: Impaired Word level: Within functional limits    Expression Expression Primary Mode of Expression: Verbal Verbal  Expression Overall Verbal Expression: Impaired Initiation: No impairment Level of Generative/Spontaneous Verbalization: Sentence Repetition: Impaired Level of Impairment: Phrase level Naming: Impairment Responsive: 51-75% accurate Confrontation: Impaired Convergent: 50-74% accurate Divergent: 0-24% accurate Verbal Errors: Phonemic paraphasias Pragmatics: No impairment Written Expression Dominant Hand: Right Written Expression: Not tested   Oral / Motor  Oral Motor/Sensory Function Overall Oral Motor/Sensory Function: Within functional limits Motor Speech Overall Motor Speech: Appears within functional limits for tasks assessed Respiration: Within functional limits Phonation: Normal Resonance: Within functional limits Articulation: Within functional limitis Intelligibility: Intelligible   GO                    [name] 10/03/2017, 11:09 AM  [name], [place] CCC/SLP Pager [phone]

## 2017-10-03 NOTE — Progress Notes (Signed)
PT Cancellation Note  Patient Details Name: [name] MRN: [id number] DOB: 25-Feb-1929   Cancelled Treatment:    Reason Eval/Treat Not Completed: Medical issues which prohibited therapy(pt on bedrest)   [name] 10/03/2017, 7:12 AM  Elwyn Reach, [place]

## 2017-10-04 ENCOUNTER — Inpatient Hospital Stay (HOSPITAL_COMMUNITY): Payer: PPO

## 2017-10-04 LAB — COMPREHENSIVE METABOLIC PANEL
ALT: 20 U/L (ref 17–63)
AST: 25 U/L (ref 15–41)
Albumin: 3.4 g/dL — ABNORMAL LOW (ref 3.5–5.0)
Alkaline Phosphatase: 91 U/L (ref 38–126)
Anion gap: 8 (ref 5–15)
BUN: 11 mg/dL (ref 6–20)
CHLORIDE: 108 mmol/L (ref 101–111)
CO2: 21 mmol/L — AB (ref 22–32)
CREATININE: 1.05 mg/dL (ref 0.61–1.24)
Calcium: 8.5 mg/dL — ABNORMAL LOW (ref 8.9–10.3)
GFR calc Af Amer: >60 mL/min (ref >60)
GFR calc non Af Amer: >60 mL/min (ref >60)
GLUCOSE: 162 mg/dL — AB (ref 65–99)
Potassium: 4.6 mmol/L (ref 3.5–5.1)
SODIUM: 137 mmol/L (ref 135–145)
Total Bilirubin: 0.8 mg/dL (ref 0.3–1.2)
Total Protein: 6.7 g/dL (ref 6.5–8.1)

## 2017-10-04 LAB — GLUCOSE, CAPILLARY
GLUCOSE-CAPILLARY: 129 mg/dL — AB (ref 65–99)
Glucose-Capillary: 129 mg/dL — ABNORMAL HIGH (ref 65–99)
Glucose-Capillary: 152 mg/dL — ABNORMAL HIGH (ref 65–99)
Glucose-Capillary: 135 mg/dL — ABNORMAL HIGH (ref 65–99)
Glucose-Capillary: 99 mg/dL (ref 65–99)

## 2017-10-04 LAB — AMMONIA: Ammonia: 27 umol/L (ref 9–35)

## 2017-10-04 LAB — TSH: TSH: 4.307 u[IU]/mL (ref 0.350–4.500)

## 2017-10-04 MED ORDER — METOPROLOL TARTRATE 50 MG PO TABS
75.0000 mg | ORAL_TABLET | Freq: Two times a day (BID) | ORAL | Status: DC
  Administered 2017-10-04 – 2017-10-07 (×7): 75 mg via ORAL
  Filled 2017-10-04 (×8): qty 1

## 2017-10-04 MED ORDER — METOPROLOL TARTRATE 5 MG/5ML IV SOLN
5.0000 mg | Freq: Once | INTRAVENOUS | Status: AC
  Administered 2017-10-04: 5 mg via INTRAVENOUS
  Filled 2017-10-04: qty 5

## 2017-10-04 MED ORDER — PANTOPRAZOLE SODIUM 40 MG PO TBEC
40.0000 mg | DELAYED_RELEASE_TABLET | Freq: Every day | ORAL | Status: DC
  Administered 2017-10-04 – 2017-10-06 (×3): 40 mg via ORAL
  Filled 2017-10-04 (×3): qty 1

## 2017-10-04 MED ORDER — METOPROLOL TARTRATE 5 MG/5ML IV SOLN
INTRAVENOUS | Status: AC
  Filled 2017-10-04: qty 5

## 2017-10-04 MED ORDER — DOXYCYCLINE HYCLATE 100 MG PO TABS
100.0000 mg | ORAL_TABLET | Freq: Two times a day (BID) | ORAL | Status: DC
  Administered 2017-10-04 – 2017-10-07 (×7): 100 mg via ORAL
  Filled 2017-10-04 (×8): qty 1

## 2017-10-04 MED ORDER — ACETAMINOPHEN 325 MG PO TABS
650.0000 mg | ORAL_TABLET | Freq: Four times a day (QID) | ORAL | Status: DC | PRN
  Administered 2017-10-04: 650 mg via ORAL
  Filled 2017-10-04: qty 2

## 2017-10-04 NOTE — Evaluation (Signed)
Physical Therapy Evaluation Patient Details Name: [name] MRN: [id number] DOB: [birth year] Today's Date: 10/04/2017   History of Present Illness  [name] is an 83 y.o. male Caucasian right-handed past medical history of atrial fibrillation not on anticoagulation, diabetes, and hypothyroidism presented to [facility] ER as a stroke alert with aphasia.  Clinical Impression  Pt admitted with above diagnosis. Pt currently with functional limitations due to the deficits listed below (see PT Problem List). Presents with decr activity tolerance, balance, coordination; REcommend CIR for post-acute rehab to maximize independence and safety with mobility prior to dc home;  Pt will benefit from skilled PT to increase their independence and safety with mobility to allow discharge to the venue listed below.       Follow Up Recommendations CIR    Equipment Recommendations  Rolling walker with 5" wheels;3in1 (PT)    Recommendations for Other Services Rehab consult     Precautions / Restrictions Precautions Precautions: Fall Precaution Comments: Watch HR      Mobility  Bed Mobility Overal bed mobility: Needs Assistance Bed Mobility: Supine to Sit     Supine to sit: Mod assist     General bed mobility comments: Verbal and tactile cues for techqniue; Mod assist and use of bed pad to scoot hips to EOB and square off  Transfers Overall transfer level: Needs assistance Equipment used: Rolling walker (2 wheeled) Transfers: Sit to/from Stand Sit to Stand: Min assist         General transfer comment: Min assist to steady  Ambulation/Gait Ambulation/Gait assistance: Min assist;+2 safety/equipment Ambulation Distance (Feet): (pivotal steps bed to chair) Assistive device: Rolling walker (2 wheeled) Gait Pattern/deviations: Shuffle     General Gait Details: Limited gait distance due to tachycardia  Stairs            Wheelchair Mobility    Modified Rankin (Stroke  Patients Only)       Balance Overall balance assessment: Needs assistance Sitting-balance support: No upper extremity supported;Feet supported Sitting balance-Leahy Scale: Fair       Standing balance-Leahy Scale: Poor                               Pertinent Vitals/Pain Pain Assessment: No/denies pain    Home Living Family/patient expects to be discharged to:: Private residence Living Arrangements: Spouse/significant other Available Help at Discharge: Family Type of Home: House           Additional Comments: Will need to complete HOme Living section with family    Prior Function Level of Independence: Independent               Hand Dominance   Dominant Hand: Right    Extremity/Trunk Assessment   Upper Extremity Assessment Upper Extremity Assessment: Defer to OT evaluation  Noted some tremor/jerking RUE; family has indicated this is long-standing    Lower Extremity Assessment Lower Extremity Assessment: Overall WFL for tasks assessed       Communication   Communication: Expressive difficulties  Cognition Arousal/Alertness: Awake/alert Behavior During Therapy: WFL for tasks assessed/performed Overall Cognitive Status: Difficult to assess                                        General Comments General comments (skin integrity, edema, etc.): Noted HR incr as high as 150 bpm during session;  RN aware    Exercises     Assessment/Plan    PT Assessment Patient needs continued PT services  PT Problem List Decreased strength;Decreased activity tolerance;Decreased balance;Decreased mobility;Decreased coordination;Decreased cognition;Decreased knowledge of use of DME;Decreased safety awareness;Decreased knowledge of precautions       PT Treatment Interventions DME instruction;Gait training;Stair training;Functional mobility training;Therapeutic activities;Therapeutic exercise;Balance training;Neuromuscular  re-education;Cognitive remediation;Patient/family education    PT Goals (Current goals can be found in the Care Plan section)  Acute Rehab PT Goals Patient Stated Goal: Did not state PT Goal Formulation: With patient Time For Goal Achievement: 10/18/17 Potential to Achieve Goals: Good    Frequency Min 3X/week   Barriers to discharge        Co-evaluation               AM-PAC PT "6 Clicks" Daily Activity  Outcome Measure Difficulty turning over in bed (including adjusting bedclothes, sheets and blankets)?: A Little Difficulty moving from lying on back to sitting on the side of the bed? : A Lot Difficulty sitting down on and standing up from a chair with arms (e.g., wheelchair, bedside commode, etc,.)?: A Lot Help needed moving to and from a bed to chair (including a wheelchair)?: A Lot Help needed walking in hospital room?: A Lot Help needed climbing 3-5 steps with a railing? : A Lot 6 Click Score: 13    End of Session Equipment Utilized During Treatment: Gait belt Activity Tolerance: Patient tolerated treatment well Patient left: in chair;with call bell/phone within reach;with family/visitor present Nurse Communication: Mobility status PT Visit Diagnosis: Unsteadiness on feet (R26.81);Other abnormalities of gait and mobility (R26.89);Other symptoms and signs involving the nervous system (R29.898)    Time: 8264-1583 PT Time Calculation (min) (ACUTE ONLY): 18 min   Charges:   PT Evaluation $PT Eval Moderate Complexity: 1 Mod     PT G Codes:        Roney Marion, PT  Acute Rehabilitation Services Pager [phone] Office [phone]   [place] 10/04/2017, 2:33 PM

## 2017-10-04 NOTE — Progress Notes (Signed)
EEG complete - results pending 

## 2017-10-04 NOTE — Progress Notes (Signed)
Pt with sustained HR in 120s Neuro MD paged. Lopressor orders received.

## 2017-10-04 NOTE — Progress Notes (Signed)
OT Cancellation    10/04/17 0700  OT Visit Information  Last OT Received On 10/04/17  Reason Eval/Treat Not Completed Medical issues which prohibited therapy. Pt with active bedrest orders. Will await increase in activity orders prior to initiating OT evaluation. Thank you.    [place], OTR/L Acute Rehab Pager: [phone] Office: [phone]

## 2017-10-04 NOTE — Progress Notes (Signed)
STROKE TEAM PROGRESS NOTE   HISTORY OF PRESENT ILLNESS (per record) [name] is an 83 y.o. male Caucasian right-handed past medical history of atrial fibrillation not on anticoagulation, diabetes, and hypothyroidism presented to [facility] ER as a stroke alert with aphasia.  He was last normal around 5:30 PM when he was watching TV. His wife noticed he was having difficulty turning off the TV and unsteady on his feet. He tried talking to her but did not make any sense. He was immediately taken to [facility] and tele neurology was consulted. Patient received tPA at 8.25 pm. A CT angiogram of the head was performed which showed no large vessel occlusion. Patient was transferred to [facility] for further management.   He arrived around 40 PM and his symptoms had improved but still  aphasic more expressive and comprehensive. No weakness was noted on exam. Wife states that he was diagnosed  atrial fibrillation about one year ago but was not started on anticoagulation by his cardiologist as he had multiple falls this year.  Date last known well: 12.21.18 Time last known well: 5.30 pm  tPA Given: yes NIHSS: 4 Baseline MRS 0     SUBJECTIVE (INTERVAL HISTORY) His wife and family at bedside. Discussed patient's condition, he is improving but still having difficulty with   expressive  aphasia. He has been having some involuntary jerking movements left greater than right upper extremity but family feels this is long-standing.   OBJECTIVE Temp:  [98.7 F (37.1 C)-99 F (37.2 C)] 99 F (37.2 C) (12/23 0800) Pulse Rate:  [67-144] 109 (12/23 1000) Cardiac Rhythm: Atrial fibrillation (12/23 0800) Resp:  [7-30] 15 (12/23 1000) BP: (117-156)/(58-103) 123/72 (12/23 1000) SpO2:  [93 %-99 %] 95 % (12/23 1000)  CBC:  Recent Labs  Lab 10/02/17 1942  WBC 9.5  NEUTROABS 6.3  HGB 12.0*  HCT 37.9*  MCV 99.2  PLT 324    Basic Metabolic Panel:  Recent Labs  Lab 10/02/17 1942  10/04/17 0904  NA 141 137  K 4.6 4.6  CL 107 108  CO2 24 21*  GLUCOSE 198* 162*  BUN 28* 11  CREATININE 1.32* 1.05  CALCIUM 8.9 8.5*    Lipid Panel:     Component Value Date/Time   CHOL 160 10/03/2017 0833   TRIG 164 (H) 10/03/2017 0833   HDL 37 (L) 10/03/2017 0833   CHOLHDL 4.3 10/03/2017 0833   VLDL 33 10/03/2017 0833   LDLCALC 90 10/03/2017 0833   HgbA1c:  Lab Results  Component Value Date   HGBA1C 7.8 (H) 10/03/2017   Urine Drug Screen:     Component Value Date/Time   LABOPIA NONE DETECTED 10/02/2017 2119   COCAINSCRNUR NONE DETECTED 10/02/2017 2119   LABBENZ NONE DETECTED 10/02/2017 2119   AMPHETMU NONE DETECTED 10/02/2017 2119   THCU NONE DETECTED 10/02/2017 2119   LABBARB NONE DETECTED 10/02/2017 2119    Alcohol Level No results found for: ETH  IMAGING  Ct Angio Head W Or Wo Contrast 10/02/2017 IMPRESSION:  No flow reducing intracranial stenosis is observed. No abnormal postcontrast enhancement. MRI provides increased sensitivity in the detection of acute infarction, and could be performed as clinically indicated.    Ct Head Code Stroke Wo Contrast 10/02/2017 IMPRESSION:  1. Atrophy and small vessel disease. No acute intracranial findings.  2. ASPECTS is 10     MR Brain Wo Contrast - Acute LEFT MCA territory infarct, primarily temporal lobe involvement, likely a single LEFT M3 branch,  CT Head Wo Contrast - Cytotoxic edema at the site of known left temporal lobe infarct. No hemorrhage or mass effect.    Transthoracic Echocardiogram - Left ventricle: The cavity size was normal. Wall thickness was   increased in a pattern of mild LVH. Systolic function was normal.   The estimated ejection fraction was in the range of 60% to 65 00/00/00    Bilateral Carotid Dopplers - pending 00/00/00     PHYSICAL EXAM Vitals:   10/04/17 0600 10/04/17 0800 10/04/17 0900 10/04/17 1000  BP: 131/75 (!) 126/99 138/72 123/72  Pulse: 87 98 (!) 114 (!)  109  Resp: 14 (!) 22 (!) 30 15  Temp:  99 F (37.2 C)    TempSrc:  Oral    SpO2: 97% 97% 99% 95%  Weight:      Height:        PHYSICAL EXAM Physical exam: Exam: Gen: NAD Eyes: anicteric sclerae, moist conjunctivae                    CV: no MRG, no carotid bruits, no peripheral edema Mental Status: Alert, follows simple commands, pleasant  Neuro: Detailed Neurologic Exam  Speech:    Mild  expressive aphasia with difficulty with naming and repetition. Nonfluent speech. Good comprehension  Cranial Nerves:    The pupils are equal, round, and reactive to light.. Attempted, Fundi not visualized.  EOMI. No gaze preference. Visual fields full. Face symmetric, Tongue midline. Hearing intact to voice. Shoulder shrug intact  Motor Observation:    no involuntary movements noted. Tone appears normal.     Strength:    5/5     Sensation:  Intact to LT  Plantars downgoing.     ASSESSMENT/PLAN [name] is a 83 y.o. male with history of sleep apnea, prostate cancer, neuropathy, hypothyroidism, diabetes mellitus, and atrial fibrillation without anticoagulation secondary to multiple falls presenting with aphasia.  tPA 10/02/2017 at 8.25 pm  Stroke:  Follow-up imaging pending  Resultant  Mild mixed aphasia  CT head - Atrophy and small vessel disease. No acute intracranial findings.   MRI head - pending  MRA head - not performed  CTA Head - No flow reducing intracranial stenosis is observed.   Carotid Doppler - pending 2D Echo - Left ventricle: The cavity size was normal. Wall thickness was   increased in a pattern of mild LVH. Systolic function was normal.    The estimated ejection fraction was in the range of 60% to 65  LDL - 90  HgbA1c - 7.8  VTE prophylaxis - SCDs Diet gluten free Room service appropriate? Yes; Fluid consistency: Thin  aspirin 81 mg daily prior to admission, now on No antithrombotic S/P TPA  Patient will be counseled to be compliant  with his antithrombotic medications  Ongoing aggressive stroke risk factor management  Therapy recommendations:  pending  Disposition:  Pending  Hypertension  Stable  Permissive hypertension (OK if < 220/120) but gradually normalize in 5-7 days  Long-term BP goal normotensive  Hyperlipidemia  Home meds:  No lipid lowering medications prior to admission  LDL 90 goal < 70   Diabetes  HgbA1c7.8, goal < 7.0  Unc / Controlled  Other Stroke Risk Factors  Advanced age  Obesity, Body mass index is 30.8 kg/m., recommend weight loss, diet and exercise as appropriate   Obstructive sleep apnea  Atrial fibrillation  Other Active Problems  Kidney disease - BUN 28 ; creatinine 1.32   Plan /  Recommendations Resume home medications. Mobilize out of bed. Therapy consults. Transfer to floor bed when available. Rehabilitation consult. Check carotid dopplers Hospital day # 2  I have personally examined this patient, reviewed notes, independently viewed imaging studies, participated in medical decision making and plan of care.ROS completed by me personally and pertinent positives fully documented  I have made any additions or clarifications directly to the above note.  I had a long discussion with the patient, wife and daughter regarding his embolic stroke and discuss risk benefit of anticoagulation despite his high fall risk I think anticoagulation would be favorable given his recent embolic stroke. They will discuss this with the patient and make a final decision soon.  This patient is critically ill and at significant risk of neurological worsening, death and care requires constant monitoring of vital signs, hemodynamics,respiratory and cardiac monitoring, extensive review of multiple databases, frequent neurological assessment, discussion with family, other specialists and medical decision making of high complexity.I have made any additions or clarifications directly to the above  note.This critical care time does not reflect procedure time, or teaching time or supervisory time of PA/NP/Med Resident etc but could involve care discussion time.  I spent 30 minutes of neurocritical care time  in the care of  this patient.     Antony Contras, MD Medical Director [facility] Stroke Center Pager: [phone] 10/04/2017 11:55 AM     Antony Contras, MD [facility] Stroke [facility]     To contact Stroke Continuity provider, please refer to [email or web address] After hours, contact General Neurology

## 2017-10-04 NOTE — Procedures (Addendum)
  ELECTROENCEPHALOGRAM REPORT  Date of Study: 10/04/17  Patient's Name: [name] MRN: [id number] Date of Birth: 1929/02/14  Referring Provider: Mikey Bussing, PA-C  Clinical History: [name] is a 83 y.o. malewith atrial fibrillation, diabetes, and [facility]roidism[facility] alert (aphasia, confusion, ataxia) w/ tPA.  Head MRI has shown an acute Lt MCA stroke (temporal), likely an M3 branch. He has been having some involuntary jerking movements left greater than right upper extremity but family feels this is long-standing.   Medications: Scheduled Meds: . aspirin EC  81 mg Oral Daily  . doxycycline  100 mg Oral Q12H  . heparin injection (subcutaneous)  5,000 Units Subcutaneous Q8H  . insulin aspart  0-9 Units Subcutaneous TID WC  . metoprolol tartrate  75 mg Oral BID  . pantoprazole  40 mg Oral QHS   Continuous Infusions: . sodium chloride     Followed by  . sodium chloride 100 mL/hr (10/02/17 2127)  . sodium chloride    . sodium chloride 75 mL/hr at 10/04/17 0600   PRN Meds:.acetaminophen            Technical Summary: This is a standard 16 channel EEG recording performed according to the international 10-20 electrode system.  AP bipolar, transverse bipolar, and referential montages were obtained, and digitally reformatted as necessary.  Duration of tracing: 22:37  Description: Patient is noted to be alert, but confused, shaky, with frequent mouth movements/trembling.  In the awake state there is a low amplitude, mildly disorganized 6-7 Hz rhythm seen from the posterior head regions in a symmetric fashion.  Mild assymetry is noted with slightly more disorganization and slowing seen over the left hemisphere.  Frequent background muscle artifact is seen predominately from frontal/temporal regions, corresponding with patient's mouth movements, which abates with drowsiness.   Drowsiness is noted by vertex waves, however no definite  stage II sleep was identified.  Neither hyperventilation or photic stimulation were performed.  EKG was monitored and noted to be sinus rhythym with an average heart rate of 78 bpm.  No epileptiform changes were noted.  Impression: This is an abnormal EEG due to background slowing/disorganization which is slightly more prominent over the left hemisphere, likely corresponding with patients recent stroke.  Frequent background muscle artifact is noted, corresponding with patients mouth movements.  The background slowing is a non-specific finding that can be seen with toxic, metabolic, diffuse, or multifocal structural process.  No definite epileptiform changes were noted.  A single EEG without epileptiform changes does not exclude the diagnosis of epilepsy. Clinical correlation advised.   Carvel Getting, M.D. Neurology Cell [phone]

## 2017-10-04 NOTE — Progress Notes (Signed)
PT Cancellation Note  Patient Details Name: AYDIEN MAJETTE MRN: [id number] DOB: 1929/07/02   Cancelled Treatment:    Reason Eval/Treat Not Completed: Medical issues which prohibited therapy   Noted strict bedrest until discontinued; Discussed with Sonia Baller, RN;  Will return later today for PT eval;   Roney Marion, [place] Pager [phone] Office [phone]    Colletta [place] 10/04/2017, 7:38 AM

## 2017-10-05 ENCOUNTER — Other Ambulatory Visit: Payer: Self-pay

## 2017-10-05 ENCOUNTER — Inpatient Hospital Stay (HOSPITAL_COMMUNITY): Payer: PPO

## 2017-10-05 DIAGNOSIS — I6300 Cerebral infarction due to thrombosis of unspecified precerebral artery: Secondary | ICD-10-CM

## 2017-10-05 DIAGNOSIS — I69320 Aphasia following cerebral infarction: Secondary | ICD-10-CM

## 2017-10-05 DIAGNOSIS — R269 Unspecified abnormalities of gait and mobility: Secondary | ICD-10-CM

## 2017-10-05 DIAGNOSIS — I69398 Other sequelae of cerebral infarction: Secondary | ICD-10-CM

## 2017-10-05 LAB — GLUCOSE, CAPILLARY
Glucose-Capillary: 128 mg/dL — ABNORMAL HIGH (ref 65–99)
Glucose-Capillary: 132 mg/dL — ABNORMAL HIGH (ref 65–99)
Glucose-Capillary: 109 mg/dL — ABNORMAL HIGH (ref 65–99)

## 2017-10-05 MED ORDER — APIXABAN 5 MG PO TABS
5.0000 mg | ORAL_TABLET | Freq: Two times a day (BID) | ORAL | Status: DC
  Administered 2017-10-05 – 2017-10-07 (×4): 5 mg via ORAL
  Filled 2017-10-05 (×5): qty 1

## 2017-10-05 NOTE — Clinical Social Work Note (Signed)
Clinical Social Work Assessment  Patient Details  Name: [name] MRN: [id number] Date of Birth: 1928/11/26  Date of referral:  10/05/17               Reason for consult:  Facility Placement                Permission sought to share information with:  Facility Sport and exercise psychologist, Family Supports Permission granted to share information::  Yes, Verbal Permission Granted  Name::     [name]  Agency::  SNFs  Relationship::  Spouse  Contact Information:  [phone]  Housing/Transportation Living arrangements for the past 2 months:  [place] of Information:  Patient, Adult Children, Spouse Patient Interpreter Needed:  None Criminal Activity/Legal Involvement Pertinent to Current Situation/Hospitalization:  No - Comment as needed Significant Relationships:  Adult Children, Other Family Members, Spouse Lives with:  Spouse Do you feel safe going back to the place where you live?  No Need for family participation in patient care:  Yes (Comment)  Care giving concerns:  CSW received consult for possible SNF placement at time of discharge. CSW spoke with patient, his wife, daughter, and granddaughter regarding PT recommendation of SNF placement at time of discharge if CIR is unable to accept him. Patient's daughter reported that patient's spouse is currently unable to care for patient at their home given patient's current physical needs and fall risk. Patient expressed understanding of PT recommendation and is agreeable to SNF placement at time of discharge. CSW to continue to follow and assist with discharge planning needs.   Social Worker assessment / plan:  CSW spoke with patient concerning possibility of rehab at [facility] before returning home if CIR cannot accept.  Employment status:  Retired Nurse, adult PT Recommendations:  [place], [place] / Referral to community resources:  Advance  Patient/Family's Response to care:  Patient recognizes need for rehab before returning home and is agreeable to a SNF in [place]. Patient reported preference for CIR near [place]. CSW explained that CIR placement depends on the insurance authorization and bed availability, as does SNF placement. Family reports understanding and has a preference of 1-CIR near [place] 2-CIR at [facility] 3-SNF. CSW provided SNF list.   Patient/Family's Understanding of and Emotional Response to Diagnosis, Current Treatment, and Prognosis:  Patient/family is realistic regarding therapy needs and expressed being hopeful for SNF placement. Patient's family expressed understanding of CSW role and discharge process as well as medical condition. No questions/concerns about plan or treatment.    Emotional Assessment Appearance:  Appears stated age Attitude/Demeanor/Rapport:  Other(Appropriate) Affect (typically observed):  Accepting, Appropriate Orientation:  Oriented to Self, Oriented to Place, Oriented to Situation, Oriented to  Time Alcohol / Substance use:  Not Applicable Psych involvement (Current and /or in the community):  No (Comment)  Discharge Needs  Concerns to be addressed:  Care Coordination Readmission within the last 30 days:  No Current discharge risk:  None Barriers to Discharge:  Continued Medical Work up   [employer], [place] 10/05/2017, 12:03 PM

## 2017-10-05 NOTE — Progress Notes (Signed)
STROKE TEAM PROGRESS NOTE   HISTORY OF PRESENT ILLNESS (per record) [name] is an 83 y.o. male Caucasian right-handed past medical history of atrial fibrillation not on anticoagulation, diabetes, and hypothyroidism presented to [facility] ER as a stroke alert with aphasia.  He was last normal around 5:30 PM when he was watching TV. His wife noticed he was having difficulty turning off the TV and unsteady on his feet. He tried talking to her but did not make any sense. He was immediately taken to [facility] and tele neurology was consulted. Patient received tPA at 8.25 pm. A CT angiogram of the head was performed which showed no large vessel occlusion. Patient was transferred to [facility] for further management.   He arrived around 63 PM and his symptoms had improved but still  aphasic more expressive and comprehensive. No weakness was noted on exam. Wife states that he was diagnosed  atrial fibrillation about one year ago but was not started on anticoagulation by his cardiologist as he had multiple falls this year.  Date last known well: 12.21.18 Time last known well: 5.30 pm  tPA Given: yes NIHSS: 4 Baseline MRS 0     SUBJECTIVE (INTERVAL HISTORY) His wife and daughter at bedside. Discussed patient's condition, he is improving but still having difficulty with   expressive  aphasia.  EEG showed generalized slowing and no seizure activity. TSH, ammonia and competency metabolic panel labs were all normal. He is not having as much involuntary jerking movements today. Patient's family feels wife may not be able to provide adequate supervision at home and preferred SNF for rehabilitation rather than [facility] OBJECTIVE Temp:  [97.6 F (36.4 C)-98.8 F (37.1 C)] 97.6 F (36.4 C) (12/24 1122) Pulse Rate:  [41-113] 64 (12/24 1122) Cardiac Rhythm: Heart block (12/24 0700) Resp:  [17-28] 18 (12/24 1122) BP: (121-161)/(52-118) 161/118 (12/24 1122) SpO2:  [96 %-100 %] 100 % (12/24  1122)  CBC:  Recent Labs  Lab 10/02/17 1942  WBC 9.5  NEUTROABS 6.3  HGB 12.0*  HCT 37.9*  MCV 99.2  PLT 010    Basic Metabolic Panel:  Recent Labs  Lab 10/02/17 1942 10/04/17 0904  NA 141 137  K 4.6 4.6  CL 107 108  CO2 24 21*  GLUCOSE 198* 162*  BUN 28* 11  CREATININE 1.32* 1.05  CALCIUM 8.9 8.5*    Lipid Panel:     Component Value Date/Time   CHOL 160 10/03/2017 0833   TRIG 164 (H) 10/03/2017 0833   HDL 37 (L) 10/03/2017 0833   CHOLHDL 4.3 10/03/2017 0833   VLDL 33 10/03/2017 0833   LDLCALC 90 10/03/2017 0833   HgbA1c:  Lab Results  Component Value Date   HGBA1C 7.8 (H) 10/03/2017   Urine Drug Screen:     Component Value Date/Time   LABOPIA NONE DETECTED 10/02/2017 2119   COCAINSCRNUR NONE DETECTED 10/02/2017 2119   LABBENZ NONE DETECTED 10/02/2017 2119   AMPHETMU NONE DETECTED 10/02/2017 2119   THCU NONE DETECTED 10/02/2017 2119   LABBARB NONE DETECTED 10/02/2017 2119    Alcohol Level No results found for: ETH  IMAGING  Ct Angio Head W Or Wo Contrast 10/02/2017 IMPRESSION:  No flow reducing intracranial stenosis is observed. No abnormal postcontrast enhancement. MRI provides increased sensitivity in the detection of acute infarction, and could be performed as clinically indicated.    Ct Head Code Stroke Wo Contrast 10/02/2017 IMPRESSION:  1. Atrophy and small vessel disease. No acute intracranial findings.  2. ASPECTS is 10     MR Brain Wo Contrast - Acute LEFT MCA territory infarct, primarily temporal lobe involvement, likely a single LEFT M3 branch,   CT Head Wo Contrast - Cytotoxic edema at the site of known left temporal lobe infarct. No hemorrhage or mass effect.    Transthoracic Echocardiogram - Left ventricle: The cavity size was normal. Wall thickness was   increased in a pattern of mild LVH. Systolic function was normal.   The estimated ejection fraction was in the range of 60% to 65 00/00/00    Bilateral Carotid  Dopplers - no significant extracranial stenosis bilaterally       PHYSICAL EXAM Vitals:   10/05/17 0213 10/05/17 0642 10/05/17 0843 10/05/17 1122  BP: (!) 147/64 (!) 147/52 (!) 155/60 (!) 161/118  Pulse: 66 68 79 64  Resp: 18 18 17 18   Temp: 98.6 F (37 C) 98.1 F (36.7 C) 97.7 F (36.5 C) 97.6 F (36.4 C)  TempSrc: Oral Oral Axillary Axillary  SpO2: 98% 98% 99% 100%  Weight:      Height:        PHYSICAL EXAM Physical exam: Exam: Gen: NAD Eyes: anicteric sclerae, moist conjunctivae                    CV: no MRG, no carotid bruits, no peripheral edema Mental Status: Alert, follows simple commands, pleasant  Neuro: Detailed Neurologic Exam  Speech:    Mild  expressive aphasia with difficulty with naming and repetition. Nonfluent speech. Good comprehension  Cranial Nerves:    The pupils are equal, round, and reactive to light.. Attempted, Fundi not visualized.  EOMI. No gaze preference. Visual fields full. Face symmetric, Tongue midline. Hearing intact to voice. Shoulder shrug intact  Motor Observation:    no involuntary movements noted. Tone appears normal.     Strength:    5/5     Sensation:  Intact to LT  Plantars downgoing.     ASSESSMENT/PLAN [name] is a 83 y.o. male with history of sleep apnea, prostate cancer, neuropathy, hypothyroidism, diabetes mellitus, and atrial fibrillation without anticoagulation secondary to multiple falls presenting with aphasia.  tPA 10/02/2017 at 8.25 pm  Stroke:  Follow-up imaging pending  Resultant  Mild mixed aphasia  CT head - Atrophy and small vessel disease. No acute intracranial findings.   MRI head - pending  MRA head - not performed  CTA Head - No flow reducing intracranial stenosis is observed.   Carotid Doppler - pending 2D Echo - Left ventricle: The cavity size was normal. Wall thickness was   increased in a pattern of mild LVH. Systolic function was normal.    The estimated ejection  fraction was in the range of 60% to 65  LDL - 90  HgbA1c - 7.8  VTE prophylaxis - SCDs Diet gluten free Room service appropriate? Yes; Fluid consistency: Thin  aspirin 81 mg daily prior to admission, now on No antithrombotic S/P TPA  Patient will be counseled to be compliant with his antithrombotic medications  Ongoing aggressive stroke risk factor management  Therapy recommendations:  SNF Disposition:  SNF Hypertension  Stable  Permissive hypertension (OK if < 220/120) but gradually normalize in 5-7 days  Long-term BP goal normotensive  Hyperlipidemia  Home meds:  No lipid lowering medications prior to admission  LDL 90 goal < 70   Diabetes  HgbA1c7.8, goal < 7.0  Unc / Controlled  Other Stroke Risk Factors  Advanced age  Obesity, Body mass index is 30.8 kg/m., recommend weight loss, diet and exercise as appropriate   Obstructive sleep apnea  Atrial fibrillation  Other Active Problems  Kidney disease - BUN 28 ; creatinine 1.32   Plan / Recommendations  Continue mobilize out of bed and therapy consults. Transferred for rehabilitation to step in the next couple of days. Will change from aspirin to eliquis at the time of discharge. Long discussion with patient, wife and daughter regarding plan of care and discussion of risk-benefit of anticoagulation for stroke prevention due to A. fib versus bleeding risk. Greater than 50% time during this 25 minute visit was spent on counseling and coordination of care about atrial fibrillation, embolic stroke, bleeding risk discussion Hospital day # [place], MD Medical Director [facility] Stroke Center Pager: [phone] 10/05/2017 1:16 PM     Calvin Contras, MD [facility] Stroke Center     To contact Stroke Continuity provider, please refer to [email or web address] After hours, contact General Neurology

## 2017-10-05 NOTE — Progress Notes (Signed)
ANTICOAGULATION CONSULT NOTE - Initial Consult  Pharmacy Consult for apixaban Indication: atrial fibrillation  Allergies  Allergen Reactions  . Clindamycin Diarrhea  . Gluten Meal Diarrhea    No rye; no barley = HAS CELIAC DISEASE  . Hydrocodone Other (See Comments)    Caused mental status changes and suffered withdrawals when he stopped it  . Penicillins Other (See Comments)    From childhood: Has patient had a PCN reaction causing immediate rash, facial/tongue/throat swelling, SOB or lightheadedness with hypotension: Unk Has patient had a PCN reaction causing severe rash involving mucus membranes or skin necrosis: Unk Has patient had a PCN reaction that required hospitalization: Unk Has patient had a PCN reaction occurring within the last 10 years: No If all of the above answers are "NO", then may proceed with Cephalosporin use.   . Procaine Other (See Comments)    "Passes out"  . Sulfonamide Derivatives Hives  . Tape Other (See Comments)    PLEASE USE AN ALTERNATIVE; LIKE COBAN WRAP; TAPE TEARS THE SKIN AND CAUSES BLISTERS!!  . Wheat Bran Diarrhea    Pt family states pt has celiac disease  . Cephalexin Diarrhea and Nausea And Vomiting    Dry hives, "violent" diarhea  . Mold Extract [Trichophyton] Other (See Comments)    Stuffiness, post-nasal drip    Patient Measurements: Height: 6' (182.9 cm) Weight: 227 lb 1.2 oz (103 kg) IBW/kg (Calculated) : 77.6   Vital Signs: Temp: 97.6 F (36.4 C) (12/24 1122) Temp Source: Axillary (12/24 1122) BP: 161/118 (12/24 1122) Pulse Rate: 64 (12/24 1122)  Labs: Recent Labs    10/02/17 1942 10/04/17 0904  HGB 12.0*  --   HCT 37.9*  --   PLT 294  --   APTT 35  --   LABPROT 13.3  --   INR 1.02  --   CREATININE 1.32* 1.05    Estimated Creatinine Clearance: 62.7 mL/min (by C-G formula based on SCr of 1.05 mg/dL).   Medical History: Past Medical History:  Diagnosis Date  . Allergic rhinitis   . Asthmatic bronchitis   .  Atrial fibrillation ([place])   . Celiac disease   . Cellulitis   . Diverticulosis   . DM (diabetes mellitus) ([place])   . Fx. left wrist 1987  . Gastric polyps   . GERD (gastroesophageal reflux disease)   . History of pneumonia    w/pleural effusion  . Hypothyroidism   . Iron deficiency anemia   . Melanoma ([place])   . Neuropathy   . Prostate cancer ([place])   . REM sleep behavior disorder   . RLS (restless legs syndrome)   . Sleep apnea     Medications:  Medications Prior to Admission  Medication Sig Dispense Refill Last Dose  . Alpha-Lipoic Acid 300 MG CAPS Take 1 capsule by mouth daily.     10/02/2017 at Unknown time  . amitriptyline (ELAVIL) 75 MG tablet Take 75 mg by mouth at bedtime.    10/01/2017 at pm  . Ascorbic Acid (VITAMIN C) 1000 MG tablet Take 1,000 mg by mouth 2 (two) times daily.     10/02/2017 at am  . aspirin 81 MG tablet Take 81 mg by mouth daily.    10/02/2017 at 0900  . azelastine (ASTELIN) 0.1 % nasal spray Place 1 spray into both nostrils at bedtime.    Past Week at Unknown time  . Cholecalciferol (VITAMIN D3) 1000 UNITS CAPS Take 3,000 Units by mouth 3 (three) times daily.  10/02/2017 at Unknown time  . Coenzyme Q10 (COQ10) 200 MG CAPS Take 200 mg by mouth daily.    10/02/2017 at Unknown time  . doxycycline (VIBRA-TABS) 100 MG tablet Take 100 mg by mouth 2 (two) times daily.   Past Week at Unknown time  . fluticasone (FLONASE) 50 MCG/ACT nasal spray Place 1 spray into both nostrils daily.    Past Week at Unknown time  . furosemide (LASIX) 20 MG tablet Take 1 tablet (20 mg total) by mouth daily. (Patient taking differently: Take 20-40 mg by mouth See admin instructions. 20 mg once a day and may take an additional 20 mg once a day for a weight gain of 3-5 pounds in 24 hours) 60 tablet 3 10/02/2017 at Unknown time  . glipiZIDE (GLUCOTROL XL) 2.5 MG 24 hr tablet Take 2.5 mg by mouth daily with breakfast.   10/02/2017 at Unknown time  . GLUCOSAMINE-CHONDROITIN PO Take 1  tablet by mouth 3 (three) times daily.   10/02/2017 at Unknown time  . levocetirizine (XYZAL) 5 MG tablet Take 5 mg by mouth at bedtime.    Past Week at Unknown time  . levothyroxine (SYNTHROID, LEVOTHROID) 150 MCG tablet Take 150 mcg by mouth daily before breakfast.   10/02/2017 at Unknown time  . magnesium gluconate (MAGONATE) 500 MG tablet Take 500 mg by mouth daily.     10/02/2017 at Unknown time  . Melatonin 3 MG TABS Take 3 mg by mouth at bedtime.    10/01/2017 at pm  . metoprolol tartrate (LOPRESSOR) 25 MG tablet Take 1 tablet (25 mg total) by mouth 2 (two) times daily. Take 1 tab along with 50 mg tab to equal 75 mg twice daily. (Patient taking differently: Take 75 mg by mouth 2 (two) times daily. ) 180 tablet 3 10/02/2017 at 1000  . Multiple Vitamin (MULTIVITAMIN) tablet Take 1 tablet by mouth daily.     10/02/2017 at Unknown time  . Omega-3 Fatty Acids (FISH OIL) 1000 MG CAPS Take 1 capsule by mouth daily.   10/02/2017 at Unknown time  . pantoprazole (PROTONIX) 40 MG tablet Take 40 mg by mouth daily.   10/02/2017 at Unknown time  . Red Yeast Rice Extract 600 MG TABS Take 600 mg by mouth daily.    Past Week at Unknown time  . senna (SENOKOT) 8.6 MG tablet Take 1 tablet by mouth daily as needed for constipation.    PRN at PRN  . telmisartan (MICARDIS) 20 MG tablet Take 20 mg by mouth daily.   10/02/2017 at Unknown time   Scheduled:  . aspirin EC  81 mg Oral Daily  . doxycycline  100 mg Oral Q12H  . heparin injection (subcutaneous)  5,000 Units Subcutaneous Q8H  . insulin aspart  0-9 Units Subcutaneous TID WC  . metoprolol tartrate  75 mg Oral BID  . pantoprazole  40 mg Oral QHS    Assessment: 83 yo male with CVA and afib. Pharmacy consulted to begin apixaban -SCr= 1.05, wt= 103kg -hg= 12   Goal of Therapy:  Monitor platelets by anticoagulation protocol: Yes   Plan:  -Apixaban 5mg  po bid -Will follow patient progress  Hildred Laser, Pharm D 10/05/2017 1:31 PM

## 2017-10-05 NOTE — Evaluation (Signed)
Occupational Therapy Evaluation Patient Details Name: [name] MRN: [id number] DOB: [birth year] Today's Date: 10/05/2017    History of Present Illness [name] is an 83 y.o. male Caucasian right-handed past medical history of atrial fibrillation not on anticoagulation, diabetes, and hypothyroidism presented to [facility] ER as a stroke alert with aphasia. MRI revealed acute L MCA territory infarct primarily temporal loe involvement.   Clinical Impression   PTA, pt reports that he was independent with ADL and functional mobility. Pt currently presents with significant expressive communication deficits, B UE decreased coordination (R>L) and strength, and decreased stability with functional mobility impacting his functional participation in ADL at this time. Pt additionally presents with poor activity tolerance for ADL and decreased balance placing him at risk of falling. Pt would benefit from continued OT services while admitted to improve independence and safety with ADL and functional mobility. Recommend CIR level therapies post-acute D/C to maximize return to independence. OT will continue to follow while admitted.     Follow Up Recommendations  CIR;Supervision/Assistance - 24 hour    Equipment Recommendations  Other (comment)(TBD at next venue of care)    Recommendations for Other Services       Precautions / Restrictions Precautions Precautions: Fall Precaution Comments: Watch HR Restrictions Weight Bearing Restrictions: No      Mobility Bed Mobility Overal bed mobility: Needs Assistance Bed Mobility: Supine to Sit     Supine to sit: Mod assist     General bed mobility comments: Verbal and tactile cues for techqniue; Mod assist and use of bed pad to scoot hips to EOB and square off  Transfers Overall transfer level: Needs assistance Equipment used: Rolling walker (2 wheeled) Transfers: Sit to/from Stand Sit to Stand: Min assist         General  transfer comment: Min assist to steady    Balance Overall balance assessment: Needs assistance Sitting-balance support: No upper extremity supported;Feet supported Sitting balance-Leahy Scale: Fair     Standing balance support: Bilateral upper extremity supported Standing balance-Leahy Scale: Poor Standing balance comment: Relies on B UE support.                            ADL either performed or assessed with clinical judgement   ADL Overall ADL's : Needs assistance/impaired Eating/Feeding: Supervision/ safety;Sitting   Grooming: Supervision/safety;Sitting Grooming Details (indicate cue type and reason): Cues that comb was his Upper Body Bathing: Minimal assistance;Sitting   Lower Body Bathing: Maximal assistance;Sit to/from stand   Upper Body Dressing : Minimal assistance;Sitting   Lower Body Dressing: Maximal assistance;Sit to/from stand   Toilet Transfer: Minimal assistance;Stand-pivot;RW Armed forces technical officer Details (indicate cue type and reason): Taking a few pivotal steps Toileting- Clothing Manipulation and Hygiene: Sit to/from stand;Moderate assistance         General ADL Comments: Pt limited by B UE tremor, decreased R UE proprioception and impaired communication. Greatest difficulty in expressive communication but noted receptive deficits at times.      Vision Baseline Vision/History: Wears glasses Wears Glasses: At all times Patient Visual Report: No change from baseline Vision Assessment?: Vision impaired- to be further tested in functional context Additional Comments: Difficult to complete visual assessment due to impaired communication. Pt unable to track finger but noted to have functional eye movement with tasks completed (feel difficulty testing tracking likely due to impaired communication).      Perception     Praxis  Pertinent Vitals/Pain Pain Assessment: No/denies pain     Hand Dominance Right   Extremity/Trunk Assessment Upper  Extremity Assessment Upper Extremity Assessment: RUE deficits/detail;Generalized weakness(bilateral tremor, poor coordination) RUE Deficits / Details: Proprioceptive deficits and overshooting notably greater than L this session.    Lower Extremity Assessment Lower Extremity Assessment: Defer to PT evaluation       Communication Communication Communication: Expressive difficulties   Cognition Arousal/Alertness: Awake/alert Behavior During Therapy: WFL for tasks assessed/performed Overall Cognitive Status: Difficult to assess                                 General Comments: Pt able to follow commands but difficult to assess cognition due to communication deficits. He reports he is able to read to himself but out loud is difficult and that he knows what he is trying to say but cannot verbalize.    General Comments       Exercises     Shoulder Instructions      Home Living Family/patient expects to be discharged to:: Private residence Living Arrangements: Spouse/significant other Available Help at Discharge: Family Type of Home: House                           Additional Comments: Will need to complete home living section with family due to communication deficits.   Lives With: Spouse    Prior Functioning/Environment Level of Independence: Independent                 OT Problem List: Decreased activity tolerance;Impaired balance (sitting and/or standing);Decreased safety awareness;Decreased knowledge of use of DME or AE;Decreased knowledge of precautions;Decreased strength;Decreased coordination;Impaired UE functional use      OT Treatment/Interventions: Self-care/ADL training;Therapeutic exercise;Energy conservation;DME and/or AE instruction;Therapeutic activities;Patient/family education;Balance training;Cognitive remediation/compensation;Visual/perceptual remediation/compensation;Neuromuscular education    OT Goals(Current goals can be found  in the care plan section) Acute Rehab OT Goals Patient Stated Goal: Did not state OT Goal Formulation: With patient Time For Goal Achievement: 10/19/17 Potential to Achieve Goals: Good ADL Goals Pt Will Perform Grooming: with min assist;standing Pt Will Perform Upper Body Dressing: with set-up;sitting Pt Will Perform Lower Body Dressing: with min assist;sit to/from stand Pt Will Transfer to Toilet: with supervision;ambulating;bedside commode(BSC over toilet) Pt Will Perform Toileting - Clothing Manipulation and hygiene: with supervision;sit to/from stand Pt/caregiver will Perform Home Exercise Program: Increased ROM;Increased strength;Both right and left upper extremity;With written HEP provided;With Supervision(increased coordination)  OT Frequency: Min 3X/week   Barriers to D/C:            Co-evaluation              AM-PAC PT "6 Clicks" Daily Activity     Outcome Measure Help from another person eating meals?: A Little Help from another person taking care of personal grooming?: A Little Help from another person toileting, which includes using toliet, bedpan, or urinal?: A Lot Help from another person bathing (including washing, rinsing, drying)?: A Lot Help from another person to put on and taking off regular upper body clothing?: A Little Help from another person to put on and taking off regular lower body clothing?: A Lot 6 Click Score: 15   End of Session Equipment Utilized During Treatment: Gait belt;Rolling walker Nurse Communication: Mobility status  Activity Tolerance: Patient tolerated treatment well Patient left: in chair;with call bell/phone within reach  OT Visit Diagnosis: Other abnormalities of  gait and mobility (R26.89);Muscle weakness (generalized) (M62.81);Cognitive communication deficit (R41.841) Symptoms and signs involving cognitive functions: Cerebral infarction                Time: 6002-9847 OT Time Calculation (min): 17 min Charges:  OT General  Charges $OT Visit: 1 Visit OT Evaluation $OT Eval Moderate Complexity: 1 Mod G-Codes:     Norman Herrlich, MS OTR/L  Pager: [place] A Benny Deutschman 10/05/2017, 8:19 AM

## 2017-10-05 NOTE — NC FL2 (Signed)
MEDICAID FL2 LEVEL OF CARE SCREENING TOOL     IDENTIFICATION  Patient Name: [name] Birthdate: 1929-01-10 Sex: male Admission Date (Current Location): 10/02/2017  [facility] and [place] Number:  [employer] and Address:  [facility], [place] [address] [place], [place] [address]      Provider Number: [id number]  Attending Physician Name and Address:  Garvin Fila, MD  Relative Name and Phone Number:  [name], spouse, [phone]    Current Level of Care: Hospital Recommended Level of Care: [place] Prior Approval Number:    Date Approved/Denied:   PASRR Number: [id number]  Discharge Plan: SNF    Current Diagnoses: Patient Active Problem List   Diagnosis Date Noted  . Stroke (cerebrum) ([place]) 10/02/2017  . Afib ([place]) 01/10/2017  . SOB (shortness of breath) 01/10/2017  . Acute diastolic CHF (congestive heart failure) ([place]) 01/10/2017  . Atrial fibrillation with RVR ([place]) 01/10/2017  . Claudication ([employer]) 07/25/2016  . Paroxysmal atrial fibrillation ([place]) 07/25/2016  . Odynophagia 04/10/2014  . Allergic rhinitis 03/23/2014  . Chronic rhinitis 12/22/2013  . 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 Constipation 08/22/2011  . Obesity 09/20/2009  . G E R D 11/29/2007  . Celiac disease 11/29/2007    Orientation RESPIRATION BLADDER Height & Weight     Self, Time, Situation, Place  Normal Incontinent Weight: 103 kg (227 lb 1.2 oz) Height:  6' (182.9 cm)  BEHAVIORAL SYMPTOMS/MOOD NEUROLOGICAL BOWEL NUTRITION STATUS      Incontinent Diet(Please see DC Summary)  AMBULATORY STATUS COMMUNICATION OF NEEDS Skin   Extensive Assist Verbally Surgical wounds(Wound/incision on neck)                       Personal Care Assistance Level of Assistance  Bathing, Feeding, Dressing Bathing Assistance: Maximum assistance Feeding assistance: Limited assistance Dressing Assistance: Limited assistance     Functional  Limitations Info             SPECIAL CARE FACTORS FREQUENCY  PT (By licensed PT), OT (By licensed OT), Speech therapy     PT Frequency: 5/week OT Frequency: 3x/week     Speech Therapy Frequency: 2x/week      Contractures      Additional Factors Info  Code Status, Allergies, Insulin Sliding Scale Code Status Info: Full Allergies Info: Clindamycin, Gluten Meal, Hydrocodone, Penicillins, Procaine, Sulfonamide Derivatives, Tape, Wheat Bran, Cephalexin, Mold Extract Trichophyton   Insulin Sliding Scale Info: 3x daily with meals       Current Medications (10/05/2017):  This is the current hospital active medication list Current Facility-Administered Medications  Medication Dose Route Frequency Provider Last Rate Last Dose  . sodium chloride 0.9 % bolus 500 mL  500 mL Intravenous Once Carmin Muskrat, MD       Followed by  . 0.9 %  sodium chloride infusion  100 mL/hr Intravenous Continuous Carmin Muskrat, MD 100 mL/hr at 10/02/17 2127 100 mL/hr at 10/02/17 2127  . 0.9 %  sodium chloride infusion  50 mL Intravenous Once Zaman, Mohammed N, MD      . 0.9 %  sodium chloride infusion   Intravenous Continuous Aroor, Lanice Schwab, MD 75 mL/hr at 10/04/17 0600    . acetaminophen (TYLENOL) tablet 650 mg  650 mg Oral Q6H PRN Aroor, Lanice Schwab, MD   650 mg at 10/04/17 0345  . aspirin EC tablet 81 mg  81 mg Oral Daily Aroor, Lanice Schwab, MD  81 mg at 10/05/17 0938  . doxycycline (VIBRA-TABS) tablet 100 mg  100 mg Oral Q12H Rinehuls, David L, PA-C   100 mg at 10/05/17 4332  . heparin injection 5,000 Units  5,000 Units Subcutaneous Q8H Aroor, Lanice Schwab, MD   5,000 Units at 10/05/17 0504  . insulin aspart (novoLOG) injection 0-9 Units  0-9 Units Subcutaneous TID WC Aroor, Lanice Schwab, MD   1 Units at 10/05/17 0705  . metoprolol tartrate (LOPRESSOR) 5 MG/5ML injection           . metoprolol tartrate (LOPRESSOR) tablet 75 mg  75 mg Oral BID Garvin Fila, MD   75 mg at 10/05/17 9518  .  pantoprazole (PROTONIX) EC tablet 40 mg  40 mg Oral QHS Rumbarger, Valeda Malm, RPH   40 mg at 10/04/17 2240     Discharge Medications: Please see discharge summary for a list of discharge medications.  Relevant Imaging Results:  Relevant Lab Results:   Additional Information SSN: [name], [place]  I have personally examined this patient, reviewed notes, independently viewed imaging studies, participated in medical decision making and plan of care.ROS completed by me personally and pertinent positives fully documented  I have made any additions or clarifications directly to the above note. Agree with note above.    Antony Contras, MD Medical Director [facility] Pager: [phone] 10/05/2017 1:06 PM

## 2017-10-05 NOTE — Progress Notes (Signed)
I met with pt and his wife at bedside . We discussed an inpt rehab admission as well as goals and expectations. Pt was working as a Theme park manager in [employer] Wife and family very supportive. I will begin insurance approval with [employer] for a possible admit Wednesday pending insurance approval and bed availability. [phone]

## 2017-10-05 NOTE — Consult Note (Addendum)
Physical Medicine and Rehabilitation Consult Reason for Consult: Decreased functional mobility Referring Physician: Dr. Leonie Man   HPI: [name] is a 83 y.o. right handed male with history of diabetes mellitus with peripheral neuropathy, hypothyroidism, atrial fibrillation maintained on aspirin and multiple falls. Presented 10/02/2017 aphasia. Per chart review patient lives with spouse independent prior to admission and still driving. Wife can assist. Cranial CT scan negative. Patient did receive TPA. CT angiogram head and neck with no flow reducing intracranial stenosis. 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 Echocardiogram with ejection fraction of 65%. Systolic function was normal. EEG negative for seizure. Currently maintained on aspirin 81 mg daily for CVA prophylaxis and  Eliquis added 10/05/2017..  Regular consistency diet. Physical therapy evaluation completed 10/04/2017 with recommendations of physical medicine rehabilitation consult.  Hi fall risk noted by Neuro, rec for RW while on Eliquis Review of Systems  Constitutional: Negative for chills and fever.  HENT: Negative for hearing loss.   Eyes: Negative for blurred vision and double vision.  Respiratory: Negative for cough and shortness of breath.   Cardiovascular: Positive for palpitations. Negative for chest pain and leg swelling.  Gastrointestinal: Positive for constipation. Negative for nausea and vomiting.       GERD  Genitourinary: Positive for urgency. Negative for dysuria, flank pain and hematuria.  Musculoskeletal: Positive for myalgias.  Skin: Negative for rash.  Neurological: Positive for speech change and focal weakness.  All other systems reviewed and are negative.  Past Medical History:  Diagnosis Date  . Allergic rhinitis   . Asthmatic bronchitis   . Atrial fibrillation ([place])   . Celiac disease   . Cellulitis   . Diverticulosis   . DM (diabetes mellitus) ([place])   . Fx. left wrist 1987  .  Gastric polyps   . GERD (gastroesophageal reflux disease)   . History of pneumonia    w/pleural effusion  . Hypothyroidism   . Iron deficiency anemia   . Melanoma ([place])   . Neuropathy   . Prostate cancer ([place])   . REM sleep behavior disorder   . RLS (restless legs syndrome)   . Sleep apnea    Past Surgical History:  Procedure Laterality Date  . APPENDECTOMY    . CARPAL TUNNEL RELEASE    . CATARACT EXTRACTION  03/2011   bilateral   . CHOLECYSTECTOMY  2001  . COLONOSCOPY W/ BIOPSIES  04/27/2008   diverticulosis, duodenitis  . FOOT SURGERY    . HERNIA REPAIR  1994   bilateral  . KNEE SURGERY     x 2  . LUNG SURGERY  2001  . PROSTATECTOMY  2002  . UPPER GASTROINTESTINAL ENDOSCOPY  04/27/2008   celiac disease, gastric polyps   Family History  Problem Relation Age of Onset  . Breast cancer Mother   . Kidney failure Father   . Heart disease Father   . Rheumatic fever Father   . Colon cancer Unknown    Social History:  reports that  has never smoked. he has never used smokeless tobacco. He reports that he does not drink alcohol or use drugs. Allergies:  Allergies  Allergen Reactions  . Clindamycin Diarrhea  . Gluten Meal Diarrhea    No rye; no barley = HAS CELIAC DISEASE  . Hydrocodone Other (See Comments)    Caused mental status changes and suffered withdrawals when he stopped it  . Penicillins Other (See Comments)    From childhood: Has patient had a PCN reaction  causing immediate rash, facial/tongue/throat swelling, SOB or lightheadedness with hypotension: Unk Has patient had a PCN reaction causing severe rash involving mucus membranes or skin necrosis: Unk Has patient had a PCN reaction that required hospitalization: Unk Has patient had a PCN reaction occurring within the last 10 years: No If all of the above answers are "NO", then may proceed with Cephalosporin use.   . Procaine Other (See Comments)    "Passes out"  . Sulfonamide Derivatives Hives  . Tape Other  (See Comments)    PLEASE USE AN ALTERNATIVE; LIKE COBAN WRAP; TAPE TEARS THE SKIN AND CAUSES BLISTERS!!  . Wheat Bran Diarrhea    Pt family states pt has celiac disease  . Cephalexin Diarrhea and Nausea And Vomiting    Dry hives, "violent" diarhea  . Mold Extract [Trichophyton] Other (See Comments)    Stuffiness, post-nasal drip   Medications Prior to Admission  Medication Sig Dispense Refill  . Alpha-Lipoic Acid 300 MG CAPS Take 1 capsule by mouth daily.      Marland Kitchen amitriptyline (ELAVIL) 75 MG tablet Take 75 mg by mouth at bedtime.     . Ascorbic Acid (VITAMIN C) 1000 MG tablet Take 1,000 mg by mouth 2 (two) times daily.      Marland Kitchen aspirin 81 MG tablet Take 81 mg by mouth daily.     Marland Kitchen azelastine (ASTELIN) 0.1 % nasal spray Place 1 spray into both nostrils at bedtime.     . Cholecalciferol (VITAMIN D3) 1000 UNITS CAPS Take 3,000 Units by mouth 3 (three) times daily.     . Coenzyme Q10 (COQ10) 200 MG CAPS Take 200 mg by mouth daily.     Marland Kitchen doxycycline (VIBRA-TABS) 100 MG tablet Take 100 mg by mouth 2 (two) times daily.    . fluticasone (FLONASE) 50 MCG/ACT nasal spray Place 1 spray into both nostrils daily.     . furosemide (LASIX) 20 MG tablet Take 1 tablet (20 mg total) by mouth daily. (Patient taking differently: Take 20-40 mg by mouth See admin instructions. 20 mg once a day and may take an additional 20 mg once a day for a weight gain of 3-5 pounds in 24 hours) 60 tablet 3  . glipiZIDE (GLUCOTROL XL) 2.5 MG 24 hr tablet Take 2.5 mg by mouth daily with breakfast.    . GLUCOSAMINE-CHONDROITIN PO Take 1 tablet by mouth 3 (three) times daily.    Marland Kitchen levocetirizine (XYZAL) 5 MG tablet Take 5 mg by mouth at bedtime.     Marland Kitchen levothyroxine (SYNTHROID, LEVOTHROID) 150 MCG tablet Take 150 mcg by mouth daily before breakfast.    . magnesium gluconate (MAGONATE) 500 MG tablet Take 500 mg by mouth daily.      . Melatonin 3 MG TABS Take 3 mg by mouth at bedtime.     . metoprolol tartrate (LOPRESSOR) 25 MG tablet  Take 1 tablet (25 mg total) by mouth 2 (two) times daily. Take 1 tab along with 50 mg tab to equal 75 mg twice daily. (Patient taking differently: Take 75 mg by mouth 2 (two) times daily. ) 180 tablet 3  . Multiple Vitamin (MULTIVITAMIN) tablet Take 1 tablet by mouth daily.      . Omega-3 Fatty Acids (FISH OIL) 1000 MG CAPS Take 1 capsule by mouth daily.    . pantoprazole (PROTONIX) 40 MG tablet Take 40 mg by mouth daily.    . Red Yeast Rice Extract 600 MG TABS Take 600 mg by mouth daily.     Marland Kitchen  senna (SENOKOT) 8.6 MG tablet Take 1 tablet by mouth daily as needed for constipation.     Marland Kitchen telmisartan (MICARDIS) 20 MG tablet Take 20 mg by mouth daily.      Home: Home Living Family/patient expects to be discharged to:: Private residence Living Arrangements: Spouse/significant other Available Help at Discharge: Family Type of Home: House Additional Comments: Will need to complete HOme Living section with family  Lives With: Spouse  Functional History: Prior Function Level of Independence: Independent Functional Status:  Mobility: Bed Mobility Overal bed mobility: Needs Assistance Bed Mobility: Supine to Sit Supine to sit: Mod assist General bed mobility comments: Verbal and tactile cues for techqniue; Mod assist and use of bed pad to scoot hips to EOB and square off Transfers Overall transfer level: Needs assistance Equipment used: Rolling walker (2 wheeled) Transfers: Sit to/from Stand Sit to Stand: Min assist General transfer comment: Min assist to steady Ambulation/Gait Ambulation/Gait assistance: Min assist, +2 safety/equipment Ambulation Distance (Feet): (pivotal steps bed to chair) Assistive device: Rolling walker (2 wheeled) Gait Pattern/deviations: Shuffle General Gait Details: Limited gait distance due to tachycardia    ADL:    Cognition: Cognition Overall Cognitive Status: Difficult to assess Orientation Level: Oriented X4 Attention: Focused Focused Attention:  Appears intact Cognition Arousal/Alertness: Awake/alert Behavior During Therapy: WFL for tasks assessed/performed Overall Cognitive Status: Difficult to assess Difficult to assess due to: Impaired communication  Blood pressure (!) 147/64, pulse 66, temperature 98.6 F (37 C), temperature source Oral, resp. rate 18, height 6' (1.829 m), weight 103 kg (227 lb 1.2 oz), SpO2 98 %. Physical Exam  Vitals reviewed. HENT:  Head: Normocephalic.  Eyes: EOM are normal.  Neck: Normal range of motion. Neck supple. No thyromegaly present.  Cardiovascular:  Cardiac rate controlled  Respiratory: Effort normal and breath sounds normal. No respiratory distress.  GI: Soft. Bowel sounds are normal. He exhibits no distension.  Neurological: He is alert.  Makes good eye contact with examiner. Patient with some subtle expressive receptive aphasia. He was able to state his name and age as well as follow simple commands.  Motor:  5/5 in B delt , Bi , Tri, Grip, HF, KE , ADF Sensation: reduced LT below the tibial tubercle bilateral Difficult with naming spontaneously as well as visual objects such as ring, able to repeat Comprehension appears intact Results for orders placed or performed during the hospital encounter of 10/02/17 (from the past 24 hour(s))  Glucose, capillary     Status: Abnormal   Collection Time: 10/04/17  7:39 AM  Result Value Ref Range   Glucose-Capillary 129 (H) 65 - 99 mg/dL  Comprehensive metabolic panel     Status: Abnormal   Collection Time: 10/04/17  9:04 AM  Result Value Ref Range   Sodium 137 135 - 145 mmol/L   Potassium 4.6 3.5 - 5.1 mmol/L   Chloride 108 101 - 111 mmol/L   CO2 21 (L) 22 - 32 mmol/L   Glucose, Bld 162 (H) 65 - 99 mg/dL   BUN 11 6 - 20 mg/dL   Creatinine, Ser 1.05 0.61 - 1.24 mg/dL   Calcium 8.5 (L) 8.9 - 10.3 mg/dL   Total Protein 6.7 6.5 - 8.1 g/dL   Albumin 3.4 (L) 3.5 - 5.0 g/dL   AST 25 15 - 41 U/L   ALT 20 17 - 63 U/L   Alkaline Phosphatase 91 38  - 126 U/L   Total Bilirubin 0.8 0.3 - 1.2 mg/dL   GFR calc non Af  Amer >60 >60 mL/min   GFR calc Af Amer >60 >60 mL/min   Anion gap 8 5 - 15  TSH     Status: None   Collection Time: 10/04/17  9:04 AM  Result Value Ref Range   TSH 4.307 0.350 - 4.500 uIU/mL  Ammonia     Status: None   Collection Time: 10/04/17  9:04 AM  Result Value Ref Range   Ammonia 27 9 - 35 umol/L  Glucose, capillary     Status: Abnormal   Collection Time: 10/04/17 12:00 PM  Result Value Ref Range   Glucose-Capillary 152 (H) 65 - 99 mg/dL  Glucose, capillary     Status: Abnormal   Collection Time: 10/04/17  4:08 PM  Result Value Ref Range   Glucose-Capillary 135 (H) 65 - 99 mg/dL  Glucose, capillary     Status: None   Collection Time: 10/04/17  9:43 PM  Result Value Ref Range   Glucose-Capillary 99 65 - 99 mg/dL   Ct Head Wo Contrast  Result Date: 10/03/2017 CLINICAL DATA:  Stroke follow-up EXAM: CT HEAD WITHOUT CONTRAST TECHNIQUE: Contiguous axial images were obtained from the base of the skull through the vertex without intravenous contrast. COMPARISON:  Brain MRI 10/03/2017 FINDINGS: Brain: There is cytotoxic edema within the left temporal lobe at the site of known infarct. No acute hemorrhage. No midline shift or other mass effect. Size and configuration of the ventricles is unchanged. Vascular: No hyperdense vessel or unexpected calcification. Skull: Normal skullbase.  No calvarial fracture. Sinuses/Orbits: The paranasal sinuses are clear. No mastoid or middle ear effusion. Normal orbits. Other: None IMPRESSION: Cytotoxic edema at the site of known left temporal lobe infarct. No hemorrhage or mass effect. Electronically Signed   By: Ulyses Jarred M.D.   On: 10/03/2017 22:52   Mr Brain Wo Contrast  Result Date: 10/03/2017 CLINICAL DATA:  Expressive and receptive aphasia which began yesterday. tPA was given. EXAM: MRI HEAD WITHOUT CONTRAST TECHNIQUE: Multiplanar, multiecho pulse sequences of the brain and  surrounding structures were obtained without intravenous contrast. COMPARISON:  CT head and CTA head neck 10/02/2017 FINDINGS: Brain: Moderate-sized area of acute infarction (restricted diffusion, low ADC), affects the LEFT temporal lobe operculum, extending deep into the sylvian fissure to involve much of the superior temporal gyrus, also slight involvement of the LEFT insula. Posterior involvement likely affects Wernicke's area, particularly with adjacent white matter ischemia. Other than a punctate focus of acute cortical infarction affecting the LEFT posterior frontal lobe, no other areas of involvement are seen. Atrophy with chronic microvascular ischemic change. Ventriculomegaly likely hydrocephalus ex vacuo. Mild subcortical and periventricular T2 and FLAIR hyperintensities, likely chronic microvascular ischemic change. An Gradient sequence demonstrates reperfusion petechial type hemorrhage throughout the infarct. There is some coalescence posteriorly. Vascular: Flow voids are maintained. Skull and upper cervical spine: Normal marrow signal. Sinuses/Orbits: Negative. Other: None. IMPRESSION: Acute LEFT MCA territory infarct, primarily temporal lobe involvement, likely a single LEFT M3 branch, cannot be visualized even in retrospect on yesterday's imaging studies. Atrophy and small vessel disease. Consistent with history of atrial fibrillation, this is likely an embolic stroke, with evidence for reperfusion on gradient sequence today. Some coalescence posteriorly. Baseline noncontrast CT of the head recommended for follow-up. A call has been placed to the ordering provider. Electronically Signed   By: Staci Righter M.D.   On: 10/03/2017 18:54    Assessment/Plan: Diagnosis: Aphasia and gait disorder due to Left MCA distribution embolic infarct 1. Does the need for close,  24 hr/day medical supervision in concert with the patient's rehab needs make it unreasonable for this patient to be served in a less  intensive setting? Yes 2. Co-Morbidities requiring supervision/potential complications: Afib, DM with severe peripheral sensory neuropathy 3. Due to bladder management, bowel management, safety, skin/wound care, disease management, medication administration, pain management and patient education, does the patient require 24 hr/day rehab nursing? Yes 4. Does the patient require coordinated care of a physician, rehab nurse, PT (1-2 hrs/day, 5 days/week), OT (1-2 hrs/day, 5 days/week) and SLP (.5-1 hrs/day, 5 days/week) to address physical and functional deficits in the context of the above medical diagnosis(es)? Yes Addressing deficits in the following areas: balance, endurance, locomotion, strength, transferring, bowel/bladder control, bathing, dressing, feeding, grooming, toileting, cognition, speech, language, swallowing and psychosocial support 5. Can the patient actively participate in an intensive therapy program of at least 3 hrs of therapy per day at least 5 days per week? Yes 6. The potential for patient to make measurable gains while on inpatient rehab is excellent 7. Anticipated functional outcomes upon discharge from inpatient rehab are modified independent and supervision  with PT, modified independent and supervision with OT, min assist with SLP. 8. Estimated rehab length of stay to reach the above functional goals is: 10-14d 9. Anticipated D/C setting: Home 10. Anticipated post D/C treatments: [facility] therapy 11. Overall Rehab/Functional Prognosis: excellent  RECOMMENDATIONS: This patient's condition is appropriate for continued rehabilitative care in the following setting: CIR Patient has agreed to participate in recommended program. Yes Note that insurance prior authorization may be required for reimbursement for recommended care.  Comment: family preferring CIR   Calvin Parsons, PA-C 10/05/2017

## 2017-10-05 NOTE — Progress Notes (Signed)
*  Preliminary Results* Carotid artery duplex completed. Bilateral internal carotid arteries are near-normal with only minimal wall thickening or plaque. Vertebral arteries are patent with antegrade flow.  10/05/2017 1:12 PM  Maudry Mayhew, BS, RVT, RDCS, RDMS

## 2017-10-05 NOTE — Progress Notes (Signed)
Physical Therapy Treatment Patient Details Name: [name] MRN: [id number] DOB: [birth year] Today's Date: 10/05/2017    History of Present Illness [name] is an 83 y.o. male Caucasian right-handed past medical history of atrial fibrillation not on anticoagulation, diabetes, and hypothyroidism presented to [facility] ER as a stroke alert with aphasia. MRI revealed acute L MCA territory infarct primarily temporal loe involvement.    PT Comments    Pt con't to have c/o dizziness and shakiness with standing and ambulation. BP in sitting was 155/70, in standing it was 163/77. Pt with short shuffled, shaky steps requiring modA to sequence and for walker management. Pt remains to have difficulty with expressive communication, impaired balance, generalized weakness, delayed processing, and impaired sequencing. Pt remains appropriate for CIR upon d/c.   Follow Up Recommendations  CIR     Equipment Recommendations  Rolling walker with 5" wheels;3in1 (PT)    Recommendations for Other Services Rehab consult     Precautions / Restrictions Precautions Precautions: Fall Precaution Comments: Watch HR Restrictions Weight Bearing Restrictions: No    Mobility  Bed Mobility Overal bed mobility: Needs Assistance Bed Mobility: Supine to Sit     Supine to sit: Mod assist     General bed mobility comments: pt up in chair upon PT arrival  Transfers Overall transfer level: Needs assistance Equipment used: Rolling walker (2 wheeled) Transfers: Sit to/from Stand Sit to Stand: Min assist         General transfer comment: v/c's for safe hand placement  Ambulation/Gait Ambulation/Gait assistance: Mod assist;+2 safety/equipment Ambulation Distance (Feet): 25 Feet Assistive device: Rolling walker (2 wheeled) Gait Pattern/deviations: Step-to pattern;Decreased stride length;Shuffle;Trunk flexed;Narrow base of support Gait velocity: slow Gait velocity interpretation: Below normal  speed for age/gender General Gait Details: directional v/c's to sequencing stepping pattern with RW, tactile cues to maintain forward momentum with RW. pt c/o dizziness. BP 163/77. Pt with tremors/shakiness   Stairs            Wheelchair Mobility    Modified Rankin (Stroke Patients Only) Modified Rankin (Stroke Patients Only) Pre-Morbid Rankin Score: Slight disability Modified Rankin: Moderately severe disability     Balance Overall balance assessment: Needs assistance Sitting-balance support: No upper extremity supported;Feet supported Sitting balance-Leahy Scale: Fair     Standing balance support: Bilateral upper extremity supported Standing balance-Leahy Scale: Poor Standing balance comment: Relies on B UE support.                             Cognition Arousal/Alertness: Awake/alert Behavior During Therapy: WFL for tasks assessed/performed Overall Cognitive Status: Impaired/Different from baseline Area of Impairment: Following commands;Problem solving                       Following Commands: Follows one step commands with increased time;Follows multi-step commands inconsistently     Problem Solving: Slow processing;Decreased initiation;Difficulty sequencing;Requires verbal cues;Requires tactile cues General Comments: pt with delayed processing requiring max v/c's to sequence marching in place and sit to stand transfer      Exercises      General Comments        Pertinent Vitals/Pain Pain Assessment: No/denies pain    Home Living Family/patient expects to be discharged to:: Private residence Living Arrangements: Spouse/significant other Available Help at Discharge: Family Type of Home: House         Additional Comments: Will need to complete home living section with family  due to communication deficits.     Prior Function Level of Independence: Independent          PT Goals (current goals can now be found in the care plan  section) Acute Rehab PT Goals Patient Stated Goal: Did not state Progress towards PT goals: Progressing toward goals    Frequency    Min 3X/week      PT Plan Current plan remains appropriate    Co-evaluation              AM-PAC PT "6 Clicks" Daily Activity  Outcome Measure  Difficulty turning over in bed (including adjusting bedclothes, sheets and blankets)?: Unable Difficulty moving from lying on back to sitting on the side of the bed? : Unable Difficulty sitting down on and standing up from a chair with arms (e.g., wheelchair, bedside commode, etc,.)?: Unable Help needed moving to and from a bed to chair (including a wheelchair)?: A Lot Help needed walking in hospital room?: A Lot Help needed climbing 3-5 steps with a railing? : A Lot 6 Click Score: 9    End of Session Equipment Utilized During Treatment: Gait belt Activity Tolerance: Patient tolerated treatment well Patient left: in chair;with call bell/phone within reach;with family/visitor present Nurse Communication: Mobility status PT Visit Diagnosis: Unsteadiness on feet (R26.81);Other abnormalities of gait and mobility (R26.89);Other symptoms and signs involving the nervous system (W29.562)     Time: 1308-6578 PT Time Calculation (min) (ACUTE ONLY): 18 min  Charges:  $Gait Training: 8-22 mins                    G Codes:       Kittie Plater, PT, DPT Pager #: [phone] Office #: [phone]    [place] 10/05/2017, 9:31 AM

## 2017-10-05 NOTE — PMR Pre-admission (Signed)
PMR Admission Coordinator Pre-Admission Assessment  Patient: [name] is an 83 y.o., male MRN: [id number] DOB: May 15, 1929 Height: 6' (182.9 cm) Weight: 104 kg (229 lb 4.5 oz)              Insurance Information HMO:     PPO: yes     PCP:      IPA:      80/20:      OTHER: Medicare advantage plan PRIMARY: [employer]      Policy#: [id number]      Subscriber: pt CM Name: Crystal      Phone#: [phone]     Fax#: Epic access Pre-Cert#: [id number] for 7 days       Employer:  Benefits:  Phone #: [phone]     Name: 10/05/2017 Eff. Date: 10/13/2014     Deduct: none      Out of Pocket Max: $3400      Life Max: none CIR: $250 co pay per day days 1 through 6       SNF: no co pay days 1-20; $150 co pay days 21-100 Outpatient: $15 co pay per visit     Co-Pay: visits per medical neccesity Home Health: $25 copy per visit      Co-Pay: visits to be authorized DME: 80%     Co-Pay: 20% Providers: in network  SECONDARY: none        Medicaid Application Date:       Case Manager:  Disability Application Date:       Case Worker:   Emergency Facilities manager Information    Name Relation Home Work Mobile   [place] Spouse [place] [phone]       Current Medical History  Patient Admitting Diagnosis: left CVA  History of Present Illness:  [name] is a 83 y.o. right handed male with history of diabetes mellitus with peripheral neuropathy, hypothyroidism, atrial fibrillation maintained on aspirin and multiple falls. Presented 10/02/2017 aphasia.  Cranial CT scan negative. Patient did receive TPA. CT angiogram head and neck with no flow reducing intracranial stenosis. 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 Echocardiogram with ejection fraction of 65%. Systolic function was normal. EEG negative for seizure. Currently maintained on aspirin 81 mg daily for CVA prophylaxis and  Eliquis added 10/05/2017..  Regular consistency diet.   Hi fall risk noted by Neuro, rec for RW while on Eliquis  Total: 3 NIHSS  Past Medical History  Past Medical History:  Diagnosis Date  . Allergic rhinitis   . Asthmatic bronchitis   . Atrial fibrillation ([place])   . Celiac disease   . Cellulitis   . Diverticulosis   . DM (diabetes mellitus) ([place])   . Fx. left wrist 1987  . Gastric polyps   . GERD (gastroesophageal reflux disease)   . History of pneumonia    w/pleural effusion  . Hypothyroidism   . Iron deficiency anemia   . Melanoma ([place])   . Neuropathy   . Prostate cancer ([place])   . REM sleep behavior disorder   . RLS (restless legs syndrome)   . Sleep apnea     Family History  family history includes Breast cancer in his mother; Colon cancer in his unknown relative; Heart disease in his father; Kidney failure in his father; Rheumatic fever in his father.  Prior Rehab/Hospitalizations:  Has the patient had major surgery during 100 days prior to admission? No  Current Medications   Current  Facility-Administered Medications:  .  sodium chloride 0.9 % bolus 500 mL, 500 mL, Intravenous, Once **FOLLOWED BY** 0.9 %  sodium chloride infusion, 100 mL/hr, Intravenous, Continuous, Carmin Muskrat, MD, Last Rate: 100 mL/hr at 10/02/17 2127, 100 mL/hr at 10/02/17 2127 .  acetaminophen (TYLENOL) tablet 650 mg, 650 mg, Oral, Q6H PRN, Aroor, Lanice Schwab, MD, 650 mg at 10/04/17 0345 .  apixaban (ELIQUIS) tablet 5 mg, 5 mg, Oral, BID, Kris Mouton, RPH, 5 mg at 10/07/17 1103 .  atorvastatin (LIPITOR) tablet 40 mg, 40 mg, Oral, q1800, Costello, Mary A, NP, 40 mg at 10/06/17 1749 .  doxycycline (VIBRA-TABS) tablet 100 mg, 100 mg, Oral, Q12H, Rinehuls, David L, PA-C, 100 mg at 10/07/17 1594 .  furosemide (LASIX) tablet 20 mg, 20 mg, Oral, Daily, Costello, Mary A, NP, 20 mg at 10/07/17 0829 .  insulin aspart (novoLOG) injection 0-9 Units, 0-9 Units, Subcutaneous, TID WC, Aroor, Lanice Schwab, MD, 1 Units at 10/06/17 1700 .  levothyroxine  (SYNTHROID, LEVOTHROID) tablet 150 mcg, 150 mcg, Oral, QAC breakfast, Garvin Fila, MD, 150 mcg at 10/07/17 0830 .  metoprolol tartrate (LOPRESSOR) tablet 75 mg, 75 mg, Oral, BID, Garvin Fila, MD, 75 mg at 10/07/17 0830 .  pantoprazole (PROTONIX) EC tablet 40 mg, 40 mg, Oral, QHS, Rumbarger, Valeda Malm, RPH, 40 mg at 10/06/17 2136  Patients Current Diet: Diet gluten free Room service appropriate? Yes; Fluid consistency: Thin  Precautions / Restrictions Precautions Precautions: Fall Precaution Comments: Watch HR Restrictions Weight Bearing Restrictions: No   Has the patient had 2 or more falls or a fall with injury in the past year?Yes Wife reports balance issues at baseline. Uses RW at night when up to bathroom. Uses Cane in the community per wife.  Prior Activity Level Works as a Teacher, music. 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 Walker and cane  Prior Functional Level Prior Function Level of Independence: (worked as a Teacher, music; fulltime)  Self Care: Did the patient need help bathing, dressing, using the toilet or eating?  Independent  Indoor Mobility: Did the patient need assistance with walking from room to room (with or without device)? Independent  Stairs: Did the patient need assistance with internal or external stairs (with or without device)? Independent  Functional Cognition: Did the patient need help planning regular tasks such as shopping or remembering to take medications? Needed some help  Current Functional Level Cognition  Overall Cognitive Status: Impaired/Different from baseline Difficult to assess due to: Impaired communication Orientation Level: Oriented to person, Oriented to place, Disoriented to time Following Commands: Follows one step commands  with increased time, Follows multi-step commands inconsistently Safety/Judgement: Decreased awareness of deficits General Comments: Some word finding difficulties noted.  Attention: Focused Focused Attention: Appears intact    Extremity Assessment (includes Sensation/Coordination)  Upper Extremity Assessment: RUE deficits/detail, Generalized weakness(bilateral tremor, poor coordination) RUE Deficits / Details: Proprioceptive deficits and overshooting notably greater than L this session.   Lower Extremity Assessment: Defer to PT evaluation    ADLs  Overall ADL's : Needs assistance/impaired Eating/Feeding: Supervision/ safety, Sitting Grooming: Minimal assistance, Standing, Wash/dry face, Oral care Grooming Details (indicate cue type and reason): Cues that comb was his Upper Body Bathing: Minimal assistance, Sitting Lower Body Bathing: Maximal assistance, Sit to/from stand Upper Body Dressing : Minimal assistance, Sitting Lower  Body Dressing: Maximal assistance, Sit to/from stand Toilet Transfer: Moderate assistance, +2 for physical assistance, Ambulation, RW Toilet Transfer Details (indicate cue type and reason): Simulated by sit to stand from EOB with functional mobility. Mod assist +2 for sit to stand, min assist for functional mobility. Toileting- Clothing Manipulation and Hygiene: Sit to/from stand, Moderate assistance Functional mobility during ADLs: Minimal assistance, +2 for physical assistance, +2 for safety/equipment, Rolling walker General ADL Comments: Pt limited by B UE tremor, decreased R UE proprioception and impaired communication. Greatest difficulty in expressive communication but noted receptive deficits at times.     Mobility  Overal bed mobility: Needs Assistance Bed Mobility: Supine to Sit Supine to sit: Min guard General bed mobility comments: HOB slightly elevated with use of bed rails. Increased time and effort.    Transfers  Overall transfer level: Needs  assistance Equipment used: Rolling walker (2 wheeled) Transfers: Sit to/from Stand Sit to Stand: Mod assist, +2 physical assistance General transfer comment: for sit to stand from EOB, min assist +2 for sit to stand from chair. Cues for hand placement and technique.    Ambulation / Gait / Stairs / Wheelchair Mobility  Ambulation/Gait Ambulation/Gait assistance: Min assist, +2 safety/equipment Ambulation Distance (Feet): 60 Feet Assistive device: Rolling walker (2 wheeled) Gait Pattern/deviations: Step-to pattern, Decreased stride length, Trunk flexed, Narrow base of support, Step-through pattern, Decreased stance time - right, Decreased dorsiflexion - right General Gait Details: Cues for step through gait and upright. Pt looks down due to decreased sensation in BLEs and into feet.  Gait velocity: slow Gait velocity interpretation: Below normal speed for age/gender    Posture / Balance Balance Overall balance assessment: Needs assistance Sitting-balance support: Feet supported, No upper extremity supported Sitting balance-Leahy Scale: Good Standing balance support: Single extremity supported, During functional activity Standing balance-Leahy Scale: Poor Standing balance comment: Min assist for grooming task at sink. Able to brush teeth with UE support.     Special needs/care consideration BiPAP/CPAP  N/a CPM  N/a Continuous Drip IV  N/a Dialysis  N/a Life Vest  N/a Oxygen  N/a Special Bed high low bed recommended due to high risk falls pta Trach Size  N/a Wound /a Skin Wounds left leg and neck                              Bowel mgmt: Incontinence with last BM 10/06/17 Bladder mgmt: Incontinence with external catheter in place Diabetic mgmt Hgb A1 c 7.8   Previous Home Environment Living Arrangements: Spouse/significant other  Lives With: Spouse Available Help at Discharge: Family, Available 24 hours/day Type of Home: House Home Layout: One level Home Access: Stairs to  enter Entrance Stairs-Rails: Right, Left, Can reach both Entrance Stairs-Number of Steps: 3 Bathroom Shower/Tub: Tub/shower unit, Industrial/product designer: (very small and he holds onto sink to stabilize walking) Home Care Services: No Additional Comments: confirmed with wife  Discharge Living Setting Plans for Discharge Living Setting: Patient's home, Lives with (comment)(wife) Type of Home at Discharge: House Discharge Home Layout: One level Discharge Home Access: Stairs to enter Entrance Stairs-Rails: Right, Left Entrance Stairs-Number of Steps: 3 Discharge Bathroom Shower/Tub: Tub/shower unit, Curtain Discharge Bathroom Toilet: Standard Discharge Bathroom Accessibility: No(very small bathrooms; does have some grab bars) Does the patient have any problems obtaining your medications?: No  Social/Family/Support Systems Patient Roles: Spouse, 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 Information: [name], wife Anticipated Caregiver: wife  Anticipated Caregiver's Contact Information: see above Ability/Limitations of Caregiver: wife small and petitie in stature; patient tall Caregiver Availability: 24/7 Discharge Plan Discussed with Primary Caregiver: Yes Is Caregiver In Agreement with Plan?: Yes Does Caregiver/Family have Issues with Lodging/Transportation while Pt is in Rehab?: No  Goals/Additional Needs Patient/Family Goal for Rehab: Mod I to supervision with PT, OT, and SLP Expected length of stay: ELOS 10- 14 days Cultural Considerations: 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 [place] to Admission and willing to participate: Yes Program Orientation Provided & Reviewed with Pt/Caregiver Including Roles  & Responsibilities: Yes  Decrease burden of Care through IP rehab admission: n/a  Possible need for SNF placement upon discharge:possible if pt does not reach supervision level  Patient Condition: This patient's medical  and functional status has changed since the consult dated: 10/05/17 in which the Rehabilitation Physician determined and documented that the patient's condition is appropriate for intensive rehabilitative care in an inpatient rehabilitation facility. See "History of Present Illness" (above) for medical update. Functional changes are:  Currently requiring mod assist for transfers and min assist to ambulate 60 feet RW. Patient's medical and functional status update has been discussed with the Rehabilitation physician and patient remains appropriate for inpatient rehabilitation. Will admit to inpatient rehab today.  Preadmission Screen Completed By:  [name], 10/07/2017 11:13 AM ______________________________________________________________________   Discussed status with Dr. Posey Pronto on 10/07/17 at 1119 and received telephone approval for admission today.  Admission Coordinator:  [name], time 1119/Date 10/07/17

## 2017-10-06 LAB — GLUCOSE, CAPILLARY
GLUCOSE-CAPILLARY: 128 mg/dL — AB (ref 65–99)
GLUCOSE-CAPILLARY: 156 mg/dL — AB (ref 65–99)
GLUCOSE-CAPILLARY: 192 mg/dL — AB (ref 65–99)
Glucose-Capillary: 137 mg/dL — ABNORMAL HIGH (ref 65–99)

## 2017-10-06 MED ORDER — LEVOTHYROXINE SODIUM 75 MCG PO TABS: 150.0000 ug | ORAL_TABLET | Freq: Every day | ORAL | Status: DC

## 2017-10-06 MED ORDER — LEVOTHYROXINE SODIUM 75 MCG PO TABS
150.0000 ug | ORAL_TABLET | Freq: Every day | ORAL | Status: DC
  Administered 2017-10-06 – 2017-10-07 (×2): 150 ug via ORAL
  Filled 2017-10-06 (×2): qty 2

## 2017-10-06 MED ORDER — ATORVASTATIN CALCIUM 40 MG PO TABS
40.0000 mg | ORAL_TABLET | Freq: Every day | ORAL | Status: DC
  Administered 2017-10-06: 40 mg via ORAL
  Filled 2017-10-06: qty 1

## 2017-10-06 MED ORDER — FUROSEMIDE 20 MG PO TABS
20.0000 mg | ORAL_TABLET | Freq: Every day | ORAL | Status: DC
  Administered 2017-10-06 – 2017-10-07 (×2): 20 mg via ORAL
  Filled 2017-10-06 (×2): qty 1

## 2017-10-06 NOTE — Progress Notes (Signed)
STROKE TEAM PROGRESS NOTE   HISTORY OF PRESENT ILLNESS (per record) [name] is an 83 y.o. male Caucasian right-handed past medical history of atrial fibrillation not on anticoagulation, diabetes, and hypothyroidism presented to [facility] ER as a stroke alert with aphasia.  He was last normal around 5:30 PM when he was watching TV. His wife noticed he was having difficulty turning off the TV and unsteady on his feet. He tried talking to her but did not make any sense. He was immediately taken to [facility] and tele neurology was consulted. Patient received tPA at 8.25 pm. A CT angiogram of the head was performed which showed no large vessel occlusion. Patient was transferred to [facility] for further management.   He arrived around 73 PM and his symptoms had improved but still  aphasic more expressive and comprehensive. No weakness was noted on exam. Wife states that he was diagnosed  atrial fibrillation about one year ago but was not started on anticoagulation by his cardiologist as he had multiple falls this year.  Date last known well: 12.21.18 Time last known well: 5.30 pm  tPA Given: yes NIHSS: 4 Baseline MRS 0  SUBJECTIVE (INTERVAL HISTORY) No family at bedside.  Patient voices no new complaints.  He believes his condition is stable.  No new or acute events reported overnight.  OBJECTIVE Temp:  [97.5 F (36.4 C)-98.8 F (37.1 C)] 98.8 F (37.1 C) (12/25 1336) Pulse Rate:  [58-76] 58 (12/25 1336) Cardiac Rhythm: Normal sinus rhythm (12/25 0950) Resp:  [16-18] 18 (12/25 1336) BP: (140-162)/(54-67) 140/55 (12/25 1336) SpO2:  [94 %-100 %] 97 % (12/25 1336)  CBC:  Recent Labs  Lab 10/02/17 1942  WBC 9.5  NEUTROABS 6.3  HGB 12.0*  HCT 37.9*  MCV 99.2  PLT 314    Basic Metabolic Panel:  Recent Labs  Lab 10/02/17 1942 10/04/17 0904  NA 141 137  K 4.6 4.6  CL 107 108  CO2 24 21*  GLUCOSE 198* 162*  BUN 28* 11  CREATININE 1.32* 1.05  CALCIUM 8.9 8.5*     Lipid Panel:     Component Value Date/Time   CHOL 160 10/03/2017 0833   TRIG 164 (H) 10/03/2017 0833   HDL 37 (L) 10/03/2017 0833   CHOLHDL 4.3 10/03/2017 0833   VLDL 33 10/03/2017 0833   LDLCALC 90 10/03/2017 0833   HgbA1c:  Lab Results  Component Value Date   HGBA1C 7.8 (H) 10/03/2017   Urine Drug Screen:     Component Value Date/Time   LABOPIA NONE DETECTED 10/02/2017 2119   COCAINSCRNUR NONE DETECTED 10/02/2017 2119   LABBENZ NONE DETECTED 10/02/2017 2119   AMPHETMU NONE DETECTED 10/02/2017 2119   THCU NONE DETECTED 10/02/2017 2119   LABBARB NONE DETECTED 10/02/2017 2119    Alcohol Level No results found for: ETH  IMAGING  Ct Angio Head W Or Wo Contrast 10/02/2017 IMPRESSION:  No flow reducing intracranial stenosis is observed. No abnormal postcontrast enhancement. MRI provides increased sensitivity in the detection of acute infarction, and could be performed as clinically indicated.   Ct Head Code Stroke Wo Contrast 10/02/2017 IMPRESSION:  1. Atrophy and small vessel disease. No acute intracranial findings.  2. ASPECTS is 10   MR Brain Wo Contrast - Acute LEFT MCA territory infarct, primarily temporal lobe involvement, likely a single LEFT M3 branch,  CT Head Wo Contrast - Cytotoxic edema at the site of known left temporal lobe infarct. No hemorrhage or mass effect.  Transthoracic  Echocardiogram - Left ventricle: The cavity size was normal. Wall thickness was   increased in a pattern of mild LVH. Systolic function was normal.   The estimated ejection fraction was in the range of 60% to 65  Bilateral Carotid Dopplers - no significant extracranial stenosis bilaterally  EEG  10/04/17 Impression: This is an abnormal EEG due to background slowing/disorganization which is slightly more prominent over the left hemisphere, likely corresponding with patients recent stroke.  Frequent background muscle artifact is noted, corresponding with patients mouth  movements.  The background slowing is a non-specific finding that can be seen with toxic, metabolic, diffuse, or multifocal structural process.  No definite epileptiform changes were noted.  A single EEG without epileptiform changes does not exclude the diagnosis of epilepsy. Clinical correlation advised.   PHYSICAL EXAM Vitals:   10/06/17 0107 10/06/17 0552 10/06/17 1017 10/06/17 1336  BP: (!) 153/67 (!) 143/54 (!) 150/60 (!) 140/55  Pulse: 63 66 76 (!) 58  Resp: 16 18 18 18   Temp: 98.7 F (37.1 C) 97.9 F (36.6 C) 98.6 F (37 C) 98.8 F (37.1 C)  TempSrc: Oral Oral Oral Oral  SpO2: 98% 100% 96% 97%  Weight:      Height:        PHYSICAL EXAM Physical exam: Exam: Gen: NAD Eyes: anicteric sclerae, moist conjunctivae                    CV: no MRG, no carotid bruits, no peripheral edema Mental Status: Alert, follows simple commands, pleasant  Neuro: Detailed Neurologic Exam  Speech:    Mild  expressive aphasia with difficulty with naming and repetition. Nonfluent speech. Good comprehension  Cranial Nerves:    The pupils are equal, round, and reactive to light.. Attempted, Fundi not visualized.  EOMI. No gaze preference. Visual fields full. Face symmetric, Tongue midline. Hearing intact to voice. Shoulder shrug intact  Motor Observation:    no involuntary movements noted. Tone appears normal.     Strength:    5/5     Sensation:  Intact to LT  Plantars downgoing.   ASSESSMENT/PLAN [name] is a 83 y.o. male with history of sleep apnea, prostate cancer, neuropathy, hypothyroidism, diabetes mellitus, and atrial fibrillation without anticoagulation secondary to multiple falls presenting with aphasia.  tPA 10/02/2017 at 8.25 pm  Stroke:  Follow-up imaging pending  Resultant  Mild mixed aphasia  CT head - Atrophy and small vessel disease. No acute intracranial findings.   MRI head -acute left MCA stroke  MRA head - not performed  CTA Head - No flow  reducing intracranial stenosis is observed.   Carotid Doppler -negative 2D Echo - Left ventricle: The cavity size was normal. Wall thickness was   increased in a pattern of mild LVH. Systolic function was normal.    The estimated ejection fraction was in the range of 60% to 65  LDL - 90  HgbA1c - 7.8  VTE prophylaxis - SCDs Diet gluten free Room service appropriate? Yes; Fluid consistency: Thin  aspirin 81 mg daily prior to admission, now on No antithrombotic S/P TPA  Patient will be counseled to be compliant with his antithrombotic medications  Ongoing aggressive stroke risk factor management  Therapy recommendations: CIR Disposition: CIR in a.m.  10/05/17: Continue mobilize out of bed and therapy consults. Transferred for rehabilitation to step in the next couple of days. Will change from aspirin to eliquis at the time of discharge. Long discussion with patient, wife  and daughter regarding plan of care and discussion of risk-benefit of anticoagulation for stroke prevention due to A. fib versus bleeding risk.   10/06/2017: Neuro exam remains stable.  Patient continues to have expressive and receptive aphasia.  Working with therapies.  Encouraged to sing songs that he knows by heart.  Patient appears frustrated by condition but looking forward to going to CIR in the morning.  Will restart some of his home medications today.  Aspirin 81 mg discontinued will continue on Eliquis.  Hypothyroidism Home dose of Synthroid restarted 10/07/2007 TSH 4.307 on 10/04/17  Possible Congestive heart failure Home dose of Lasix 20 mg daily restarted on 10/06/2017 Daily weights to be documented in electronic chart  Hypertension  Stable  Permissive hypertension (OK if < 220/120) but gradually normalize in 5-7 days  Long-term BP goal normotensive Metoprolol restarted twice daily Hold Micardis for now  Hyperlipidemia  Home meds:  No lipid lowering medications prior to admission  LDL 90  goal < 70  Add Lipitor 40 mg daily on 10/06/2017  Diabetes  HgbA1c7.8, goal < 7.0  Controlled  NovoLog as needed  Other Stroke Risk Factors  Advanced age  Obesity, Body mass index is 30.8 kg/m., recommend weight loss, diet and exercise as appropriate   Obstructive sleep apnea  Atrial fibrillation  Other Active Problems  Kidney disease - BUN 28 ; creatinine 1.32-repeat labs in a.m.  Anemia of chronic disease-repeat labs in a.m.  Plan / Recommendations Discharge to CIR in a.m.  Hospital day # 4    Renie Ora Neurology stroke team 10/06/2017 1:38 PM  I have personally examined this patient, reviewed notes, independently viewed imaging studies, participated in medical decision making and plan of care.ROS completed by me personally and pertinent positives fully documented  I have made any additions or clarifications directly to the above note. Agree with note above. Await DC to rehab in am. DC aspirin and continue eliquis alone  Antony Contras, MD Medical Director [place] Pager: [phone] 10/06/2017 3:14 PM  To contact Stroke Continuity provider, please refer to [email or web address] After hours, contact General Neurology

## 2017-10-07 ENCOUNTER — Encounter (HOSPITAL_COMMUNITY): Payer: Self-pay | Admitting: *Deleted

## 2017-10-07 ENCOUNTER — Inpatient Hospital Stay (HOSPITAL_COMMUNITY)
Admission: RE | Admit: 2017-10-07 | Discharge: 2017-10-16 | DRG: 091 | Disposition: A | Discharge status: Home / Self Care | Payer: PPO | Source: Intra-hospital | Attending: Physical Medicine & Rehabilitation | Admitting: Physical Medicine & Rehabilitation

## 2017-10-07 DIAGNOSIS — I10 Essential (primary) hypertension: Secondary | ICD-10-CM | POA: Diagnosis present

## 2017-10-07 DIAGNOSIS — Z888 Allergy status to other drugs, medicaments and biological substances status: Secondary | ICD-10-CM

## 2017-10-07 DIAGNOSIS — I482 Chronic atrial fibrillation: Secondary | ICD-10-CM | POA: Diagnosis present

## 2017-10-07 DIAGNOSIS — Z7984 Long term (current) use of oral hypoglycemic drugs: Secondary | ICD-10-CM

## 2017-10-07 DIAGNOSIS — R296 Repeated falls: Secondary | ICD-10-CM | POA: Diagnosis present

## 2017-10-07 DIAGNOSIS — G4752 REM sleep behavior disorder: Secondary | ICD-10-CM | POA: Diagnosis present

## 2017-10-07 DIAGNOSIS — Z713 Dietary counseling and surveillance: Secondary | ICD-10-CM

## 2017-10-07 DIAGNOSIS — G2581 Restless legs syndrome: Secondary | ICD-10-CM | POA: Diagnosis present

## 2017-10-07 DIAGNOSIS — K5901 Slow transit constipation: Secondary | ICD-10-CM | POA: Diagnosis not present

## 2017-10-07 DIAGNOSIS — Z8249 Family history of ischemic heart disease and other diseases of the circulatory system: Secondary | ICD-10-CM

## 2017-10-07 DIAGNOSIS — G4730 Sleep apnea, unspecified: Secondary | ICD-10-CM | POA: Diagnosis present

## 2017-10-07 DIAGNOSIS — E039 Hypothyroidism, unspecified: Secondary | ICD-10-CM

## 2017-10-07 DIAGNOSIS — IMO0002 Reserved for concepts with insufficient information to code with codable children: Secondary | ICD-10-CM

## 2017-10-07 DIAGNOSIS — L03119 Cellulitis of unspecified part of limb: Secondary | ICD-10-CM | POA: Diagnosis not present

## 2017-10-07 DIAGNOSIS — E669 Obesity, unspecified: Secondary | ICD-10-CM

## 2017-10-07 DIAGNOSIS — Z882 Allergy status to sulfonamides status: Secondary | ICD-10-CM

## 2017-10-07 DIAGNOSIS — I69320 Aphasia following cerebral infarction: Secondary | ICD-10-CM | POA: Diagnosis not present

## 2017-10-07 DIAGNOSIS — Z881 Allergy status to other antibiotic agents status: Secondary | ICD-10-CM

## 2017-10-07 DIAGNOSIS — E1142 Type 2 diabetes mellitus with diabetic polyneuropathy: Secondary | ICD-10-CM | POA: Diagnosis not present

## 2017-10-07 DIAGNOSIS — E785 Hyperlipidemia, unspecified: Secondary | ICD-10-CM

## 2017-10-07 DIAGNOSIS — Z91018 Allergy to other foods: Secondary | ICD-10-CM

## 2017-10-07 DIAGNOSIS — R4701 Aphasia: Secondary | ICD-10-CM

## 2017-10-07 DIAGNOSIS — I480 Paroxysmal atrial fibrillation: Secondary | ICD-10-CM

## 2017-10-07 DIAGNOSIS — Z9079 Acquired absence of other genital organ(s): Secondary | ICD-10-CM | POA: Diagnosis not present

## 2017-10-07 DIAGNOSIS — R197 Diarrhea, unspecified: Secondary | ICD-10-CM | POA: Diagnosis not present

## 2017-10-07 DIAGNOSIS — Z8546 Personal history of malignant neoplasm of prostate: Secondary | ICD-10-CM

## 2017-10-07 DIAGNOSIS — Z9049 Acquired absence of other specified parts of digestive tract: Secondary | ICD-10-CM

## 2017-10-07 DIAGNOSIS — I63512 Cerebral infarction due to unspecified occlusion or stenosis of left middle cerebral artery: Secondary | ICD-10-CM | POA: Diagnosis not present

## 2017-10-07 DIAGNOSIS — E6609 Other obesity due to excess calories: Secondary | ICD-10-CM

## 2017-10-07 DIAGNOSIS — K900 Celiac disease: Secondary | ICD-10-CM | POA: Diagnosis present

## 2017-10-07 DIAGNOSIS — Z6830 Body mass index (BMI) 30.0-30.9, adult: Secondary | ICD-10-CM

## 2017-10-07 DIAGNOSIS — Z6831 Body mass index (BMI) 31.0-31.9, adult: Secondary | ICD-10-CM

## 2017-10-07 DIAGNOSIS — Z85820 Personal history of malignant melanoma of skin: Secondary | ICD-10-CM

## 2017-10-07 DIAGNOSIS — G936 Cerebral edema: Secondary | ICD-10-CM | POA: Diagnosis not present

## 2017-10-07 DIAGNOSIS — Z880 Allergy status to penicillin: Secondary | ICD-10-CM

## 2017-10-07 DIAGNOSIS — R2689 Other abnormalities of gait and mobility: Secondary | ICD-10-CM | POA: Diagnosis not present

## 2017-10-07 DIAGNOSIS — R140 Abdominal distension (gaseous): Secondary | ICD-10-CM

## 2017-10-07 DIAGNOSIS — I69398 Other sequelae of cerebral infarction: Secondary | ICD-10-CM | POA: Diagnosis not present

## 2017-10-07 DIAGNOSIS — Z79890 Hormone replacement therapy (postmenopausal): Secondary | ICD-10-CM

## 2017-10-07 DIAGNOSIS — E1169 Type 2 diabetes mellitus with other specified complication: Secondary | ICD-10-CM

## 2017-10-07 DIAGNOSIS — J309 Allergic rhinitis, unspecified: Secondary | ICD-10-CM | POA: Diagnosis not present

## 2017-10-07 DIAGNOSIS — Z885 Allergy status to narcotic agent status: Secondary | ICD-10-CM

## 2017-10-07 DIAGNOSIS — Z79899 Other long term (current) drug therapy: Secondary | ICD-10-CM

## 2017-10-07 DIAGNOSIS — Z91048 Other nonmedicinal substance allergy status: Secondary | ICD-10-CM

## 2017-10-07 DIAGNOSIS — G4700 Insomnia, unspecified: Secondary | ICD-10-CM | POA: Diagnosis not present

## 2017-10-07 DIAGNOSIS — Z7951 Long term (current) use of inhaled steroids: Secondary | ICD-10-CM

## 2017-10-07 DIAGNOSIS — R269 Unspecified abnormalities of gait and mobility: Secondary | ICD-10-CM | POA: Diagnosis not present

## 2017-10-07 DIAGNOSIS — K219 Gastro-esophageal reflux disease without esophagitis: Secondary | ICD-10-CM | POA: Diagnosis present

## 2017-10-07 LAB — CBC
HCT: 38.8 % — ABNORMAL LOW (ref 39.0–52.0)
Hemoglobin: 12.4 g/dL — ABNORMAL LOW (ref 13.0–17.0)
MCH: 31 pg (ref 26.0–34.0)
MCHC: 32 g/dL (ref 30.0–36.0)
MCV: 97 fL (ref 78.0–100.0)
PLATELETS: 295 10*3/uL (ref 150–400)
RBC: 4 MIL/uL — AB (ref 4.22–5.81)
RDW: 13.3 % (ref 11.5–15.5)
WBC: 9.1 10*3/uL (ref 4.0–10.5)

## 2017-10-07 LAB — GLUCOSE, CAPILLARY
GLUCOSE-CAPILLARY: 115 mg/dL — AB (ref 65–99)
GLUCOSE-CAPILLARY: 176 mg/dL — AB (ref 65–99)
Glucose-Capillary: 280 mg/dL — ABNORMAL HIGH (ref 65–99)

## 2017-10-07 LAB — BASIC METABOLIC PANEL
Anion gap: 9 (ref 5–15)
BUN: 17 mg/dL (ref 6–20)
CHLORIDE: 104 mmol/L (ref 101–111)
CO2: 25 mmol/L (ref 22–32)
Calcium: 8.6 mg/dL — ABNORMAL LOW (ref 8.9–10.3)
Creatinine, Ser: 1.13 mg/dL (ref 0.61–1.24)
GFR calc Af Amer: >60 mL/min (ref >60)
GFR, EST NON AFRICAN AMERICAN: 57 mL/min — AB (ref >60)
GLUCOSE: 144 mg/dL — AB (ref 65–99)
POTASSIUM: 3.8 mmol/L (ref 3.5–5.1)
Sodium: 138 mmol/L (ref 135–145)

## 2017-10-07 LAB — TSH: TSH: 10.604 u[IU]/mL — AB (ref 0.350–4.500)

## 2017-10-07 LAB — MAGNESIUM: Magnesium: 1.8 mg/dL (ref 1.7–2.4)

## 2017-10-07 LAB — PHOSPHORUS: Phosphorus: 3.3 mg/dL (ref 2.5–4.6)

## 2017-10-07 MED ORDER — SORBITOL 70 % SOLN
30.0000 mL | Freq: Every day | Status: DC | PRN
  Administered 2017-10-08: 30 mL via ORAL
  Filled 2017-10-07: qty 30

## 2017-10-07 MED ORDER — PANTOPRAZOLE SODIUM 40 MG PO TBEC
40.0000 mg | DELAYED_RELEASE_TABLET | Freq: Every day | ORAL | Status: DC
  Administered 2017-10-07 – 2017-10-15 (×9): 40 mg via ORAL
  Filled 2017-10-07 (×9): qty 1

## 2017-10-07 MED ORDER — DOXYCYCLINE HYCLATE 100 MG PO TABS
100.0000 mg | ORAL_TABLET | Freq: Two times a day (BID) | ORAL | Status: DC
  Administered 2017-10-07 – 2017-10-16 (×18): 100 mg via ORAL
  Filled 2017-10-07 (×18): qty 1

## 2017-10-07 MED ORDER — ONDANSETRON HCL 4 MG PO TABS: 4.0000 mg | ORAL_TABLET | Freq: Four times a day (QID) | ORAL | Status: DC | PRN

## 2017-10-07 MED ORDER — METOPROLOL TARTRATE 50 MG PO TABS
75.0000 mg | ORAL_TABLET | Freq: Two times a day (BID) | ORAL | Status: DC
  Administered 2017-10-07 – 2017-10-16 (×18): 75 mg via ORAL
  Filled 2017-10-07 (×19): qty 1

## 2017-10-07 MED ORDER — APIXABAN 5 MG PO TABS
5.0000 mg | ORAL_TABLET | Freq: Two times a day (BID) | ORAL | Status: DC
  Administered 2017-10-07 – 2017-10-16 (×18): 5 mg via ORAL
  Filled 2017-10-07 (×18): qty 1

## 2017-10-07 MED ORDER — LEVOTHYROXINE SODIUM 75 MCG PO TABS
150.0000 ug | ORAL_TABLET | Freq: Every day | ORAL | Status: DC
  Administered 2017-10-08 – 2017-10-16 (×9): 150 ug via ORAL
  Filled 2017-10-07 (×9): qty 2

## 2017-10-07 MED ORDER — ACETAMINOPHEN 325 MG PO TABS
650.0000 mg | ORAL_TABLET | Freq: Four times a day (QID) | ORAL | Status: DC | PRN
  Administered 2017-10-13: 650 mg via ORAL
  Filled 2017-10-07: qty 2

## 2017-10-07 MED ORDER — ATORVASTATIN CALCIUM 40 MG PO TABS
40.0000 mg | ORAL_TABLET | Freq: Every day | ORAL | Status: DC
  Administered 2017-10-07 – 2017-10-15 (×9): 40 mg via ORAL
  Filled 2017-10-07 (×9): qty 1

## 2017-10-07 MED ORDER — FUROSEMIDE 20 MG PO TABS
20.0000 mg | ORAL_TABLET | Freq: Every day | ORAL | Status: DC
  Administered 2017-10-07 – 2017-10-16 (×10): 20 mg via ORAL
  Filled 2017-10-07 (×10): qty 1

## 2017-10-07 MED ORDER — ONDANSETRON HCL 4 MG/2ML IJ SOLN: 4.0000 mg | Freq: Four times a day (QID) | INTRAMUSCULAR | Status: DC | PRN

## 2017-10-07 MED ORDER — INSULIN ASPART 100 UNIT/ML ~~LOC~~ SOLN
0.0000 [IU] | Freq: Three times a day (TID) | SUBCUTANEOUS | Status: DC
  Administered 2017-10-08 – 2017-10-09 (×5): 1 [IU] via SUBCUTANEOUS
  Administered 2017-10-10: 2 [IU] via SUBCUTANEOUS
  Administered 2017-10-10: 3 [IU] via SUBCUTANEOUS
  Administered 2017-10-10: 2 [IU] via SUBCUTANEOUS
  Administered 2017-10-11: 1 [IU] via SUBCUTANEOUS
  Administered 2017-10-11: 3 [IU] via SUBCUTANEOUS
  Administered 2017-10-11 – 2017-10-12 (×2): 2 [IU] via SUBCUTANEOUS
  Administered 2017-10-12: 1 [IU] via SUBCUTANEOUS
  Administered 2017-10-12: 2 [IU] via SUBCUTANEOUS
  Administered 2017-10-13: 1 [IU] via SUBCUTANEOUS
  Administered 2017-10-13 – 2017-10-14 (×5): 2 [IU] via SUBCUTANEOUS
  Administered 2017-10-15 (×3): 1 [IU] via SUBCUTANEOUS
  Administered 2017-10-16: 2 [IU] via SUBCUTANEOUS

## 2017-10-07 NOTE — Consult Note (Signed)
   [facility] Inpatient Consult   10/07/2017  [name] Oct 11, 1929 [id number]  Patient screened for needs. Patient reviewed in the [place].  Chart review reveals the patient is to be admitted in the inpatient rehab facility at [facility].  If patient has discharge needs from rehab please consult for ongoing care management needs.   Please contact:  Natividad Brood, RN BSN [facility] Liaison  [phone] business mobile phone Toll free office [phone]

## 2017-10-07 NOTE — Progress Notes (Signed)
Received order from Marlowe Shores, PA to hold 1800 s/s insulin. He was informed that current CBG was 280. Order was given to continue with current orders in the am.

## 2017-10-07 NOTE — H&P (Signed)
Physical Medicine and Rehabilitation Admission H&P    Chief Complaint  Patient presents with  . Code Stroke  : HPI: [name] is a 83 y.o. right handed male with history of diabetes mellitus with peripheral neuropathy, hypothyroidism, atrial fibrillation maintained on aspirin and multiple falls. Presented 10/02/2017 with aphasia. Per chart review patient lives with spouse independent prior to admission and still driving. Wife can assist. Cranial CT scan reviewed, unremarkable for acute process.  Patient did receive TPA. CT angiogram head and neck with no flow reducing intracranial stenosis. 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 Echocardiogram with ejection fraction of 65%. Systolic function was normal. EEG negative for seizure. Carotid Dopplers in no ICA stenosis. Currently maintained on Eliquis for CVA prophylaxis. Regular consistency diet. Physical and occupational therapy evaluations completed 10/04/2017 with recommendations of physical medicine rehabilitation consult. Patient was admitted for a comprehensive rehabilitation program.  Review of Systems  Constitutional: Negative for chills and fever.  HENT: Negative for hearing loss.   Eyes: Negative for blurred vision and double vision.  Respiratory: Negative for cough and shortness of breath.   Cardiovascular: Negative for chest pain and leg swelling.  Gastrointestinal: Positive for constipation. Negative for nausea and vomiting.       GERD  Genitourinary: Positive for urgency.  Musculoskeletal: Positive for myalgias.  Skin: Negative for rash.  Neurological: Positive for speech change and focal weakness.  All other systems reviewed and are negative.  Past Medical History:  Diagnosis Date  . Allergic rhinitis   . Asthmatic bronchitis   . Atrial fibrillation ([place])   . Celiac disease   . Cellulitis   . Diverticulosis   . DM (diabetes mellitus) ([place])   . Fx. left wrist 1987  . Gastric polyps   . GERD  (gastroesophageal reflux disease)   . History of pneumonia    w/pleural effusion  . Hypothyroidism   . Iron deficiency anemia   . Melanoma ([place])   . Neuropathy   . Prostate cancer ([place])   . REM sleep behavior disorder   . RLS (restless legs syndrome)   . Sleep apnea    Past Surgical History:  Procedure Laterality Date  . APPENDECTOMY    . CARPAL TUNNEL RELEASE    . CATARACT EXTRACTION  03/2011   bilateral   . CHOLECYSTECTOMY  2001  . COLONOSCOPY W/ BIOPSIES  04/27/2008   diverticulosis, duodenitis  . FOOT SURGERY    . HERNIA REPAIR  1994   bilateral  . KNEE SURGERY     x 2  . LUNG SURGERY  2001  . PROSTATECTOMY  2002  . UPPER GASTROINTESTINAL ENDOSCOPY  04/27/2008   celiac disease, gastric polyps   Family History  Problem Relation Age of Onset  . Breast cancer Mother   . Kidney failure Father   . Heart disease Father   . Rheumatic fever Father   . Colon cancer Unknown    Social History:  reports that  has never smoked. he has never used smokeless tobacco. He reports that he does not drink alcohol or use drugs. Allergies:  Allergies  Allergen Reactions  . Clindamycin Diarrhea  . Gluten Meal Diarrhea    No rye; no barley = HAS CELIAC DISEASE  . Hydrocodone Other (See Comments)    Caused mental status changes and suffered withdrawals when he stopped it  . Penicillins Other (See Comments)    From childhood: Has patient had a PCN reaction causing immediate rash, facial/tongue/throat swelling, SOB or  lightheadedness with hypotension: Unk Has patient had a PCN reaction causing severe rash involving mucus membranes or skin necrosis: Unk Has patient had a PCN reaction that required hospitalization: Unk Has patient had a PCN reaction occurring within the last 10 years: No If all of the above answers are "NO", then may proceed with Cephalosporin use.   . Procaine Other (See Comments)    "Passes out"  . Sulfonamide Derivatives Hives  . Tape Other (See Comments)    PLEASE  USE AN ALTERNATIVE; LIKE COBAN WRAP; TAPE TEARS THE SKIN AND CAUSES BLISTERS!!  . Wheat Bran Diarrhea    Pt family states pt has celiac disease  . Cephalexin Diarrhea and Nausea And Vomiting    Dry hives, "violent" diarhea  . Mold Extract [Trichophyton] Other (See Comments)    Stuffiness, post-nasal drip   Medications Prior to Admission  Medication Sig Dispense Refill  . Alpha-Lipoic Acid 300 MG CAPS Take 1 capsule by mouth daily.      Marland Kitchen amitriptyline (ELAVIL) 75 MG tablet Take 75 mg by mouth at bedtime.     . Ascorbic Acid (VITAMIN C) 1000 MG tablet Take 1,000 mg by mouth 2 (two) times daily.      Marland Kitchen azelastine (ASTELIN) 0.1 % nasal spray Place 1 spray into both nostrils at bedtime.     . Cholecalciferol (VITAMIN D3) 1000 UNITS CAPS Take 3,000 Units by mouth 3 (three) times daily.     . Coenzyme Q10 (COQ10) 200 MG CAPS Take 200 mg by mouth daily.     Marland Kitchen doxycycline (VIBRA-TABS) 100 MG tablet Take 100 mg by mouth 2 (two) times daily.    . fluticasone (FLONASE) 50 MCG/ACT nasal spray Place 1 spray into both nostrils daily.     . furosemide (LASIX) 20 MG tablet Take 1 tablet (20 mg total) by mouth daily. (Patient taking differently: Take 20-40 mg by mouth See admin instructions. 20 mg once a day and may take an additional 20 mg once a day for a weight gain of 3-5 pounds in 24 hours) 60 tablet 3  . glipiZIDE (GLUCOTROL XL) 2.5 MG 24 hr tablet Take 2.5 mg by mouth daily with breakfast.    . GLUCOSAMINE-CHONDROITIN PO Take 1 tablet by mouth 3 (three) times daily.    Marland Kitchen levocetirizine (XYZAL) 5 MG tablet Take 5 mg by mouth at bedtime.     Marland Kitchen levothyroxine (SYNTHROID, LEVOTHROID) 150 MCG tablet Take 150 mcg by mouth daily before breakfast.    . magnesium gluconate (MAGONATE) 500 MG tablet Take 500 mg by mouth daily.      . Melatonin 3 MG TABS Take 3 mg by mouth at bedtime.     . metoprolol tartrate (LOPRESSOR) 25 MG tablet Take 1 tablet (25 mg total) by mouth 2 (two) times daily. Take 1 tab along with  50 mg tab to equal 75 mg twice daily. (Patient taking differently: Take 75 mg by mouth 2 (two) times daily. ) 180 tablet 3  . Multiple Vitamin (MULTIVITAMIN) tablet Take 1 tablet by mouth daily.      . Omega-3 Fatty Acids (FISH OIL) 1000 MG CAPS Take 1 capsule by mouth daily.    . pantoprazole (PROTONIX) 40 MG tablet Take 40 mg by mouth daily.    . Red Yeast Rice Extract 600 MG TABS Take 600 mg by mouth daily.     Marland Kitchen senna (SENOKOT) 8.6 MG tablet Take 1 tablet by mouth daily as needed for constipation.     Marland Kitchen  telmisartan (MICARDIS) 20 MG tablet Take 20 mg by mouth daily.    . [DISCONTINUED] aspirin 81 MG tablet Take 81 mg by mouth daily.       Drug Regimen Review Drug regimen was reviewed and remains appropriate with no significant issues identified  Home: Home Living Family/patient expects to be discharged to:: Private residence Living Arrangements: Spouse/significant other Available Help at Discharge: Family, Available 24 hours/day Type of Home: House Home Access: Stairs to enter [employer] of Steps: 3 Entrance Stairs-Rails: Right, Left, Can reach both Home Layout: One level Bathroom Shower/Tub: Tub/shower unit, Industrial/product designer: (very small and he holds onto sink to stabilize walking) Additional Comments: confirmed with wife  Lives With: Spouse   Functional History: Prior Function Level of Independence: (worked as a Teacher, music; fulltime)  Functional Status:  Mobility: [place] bed mobility: Needs Assistance Bed Mobility: Supine to Sit Supine to sit: Mod assist General bed mobility comments: pt up in chair upon PT arrival Transfers Overall transfer level: Needs assistance Equipment used: Rolling walker (2 wheeled) Transfers: Sit to/from Stand Sit to Stand: Min assist General transfer comment: v/c's for safe hand placement Ambulation/Gait Ambulation/Gait assistance: Mod assist, +2 safety/equipment Ambulation  Distance (Feet): 25 Feet Assistive device: Rolling walker (2 wheeled) Gait Pattern/deviations: Step-to pattern, Decreased stride length, Shuffle, Trunk flexed, Narrow base of support General Gait Details: directional v/c's to sequencing stepping pattern with RW, tactile cues to maintain forward momentum with RW. pt c/o dizziness. BP 163/77. Pt with tremors/shakiness Gait velocity: slow Gait velocity interpretation: Below normal speed for age/gender    ADL: ADL Overall ADL's : Needs assistance/impaired Eating/Feeding: Supervision/ safety, Sitting Grooming: Supervision/safety, Sitting Grooming Details (indicate cue type and reason): Cues that comb was his Upper Body Bathing: Minimal assistance, Sitting Lower Body Bathing: Maximal assistance, Sit to/from stand Upper Body Dressing : Minimal assistance, Sitting Lower Body Dressing: Maximal assistance, Sit to/from stand Toilet Transfer: Minimal assistance, Stand-pivot, RW Toilet Transfer Details (indicate cue type and reason): Taking a few pivotal steps Toileting- Clothing Manipulation and Hygiene: Sit to/from stand, Moderate assistance General ADL Comments: Pt limited by B UE tremor, decreased R UE proprioception and impaired communication. Greatest difficulty in expressive communication but noted receptive deficits at times.   Cognition: Cognition Overall Cognitive Status: Impaired/Different from baseline Orientation Level: Oriented to person, Oriented to place, Disoriented to time Attention: Focused Focused Attention: Appears intact Cognition Arousal/Alertness: Awake/alert Behavior During Therapy: WFL for tasks assessed/performed Overall Cognitive Status: Impaired/Different from baseline Area of Impairment: Following commands, Problem solving Following Commands: Follows one step commands with increased time, Follows multi-step commands inconsistently Problem Solving: Slow processing, Decreased initiation, Difficulty sequencing,  Requires verbal cues, Requires tactile cues General Comments: pt with delayed processing requiring max v/c's to sequence marching in place and sit to stand transfer Difficult to assess due to: Impaired communication  Physical Exam: Blood pressure (!) 145/54, pulse 63, temperature 98.7 F (37.1 C), temperature source Oral, resp. rate 18, height 6' (1.829 m), weight 104 kg (229 lb 4.5 oz), SpO2 97 %. Physical Exam  Vitals reviewed. Constitutional: He appears well-developed.  Obese  HENT:  Head: Normocephalic and atraumatic.  Eyes: EOM are normal. Right eye exhibits no discharge. Left eye exhibits no discharge.  Neck: Normal range of motion. Neck supple. No thyromegaly present.  Cardiovascular: Normal rate, regular rhythm and normal heart sounds.  Respiratory: Effort normal and breath sounds normal. No respiratory distress.  GI: Soft. Bowel sounds are normal. He exhibits no  distension.  Musculoskeletal:  No edema or tenderness in extremities  Neurological: He is alert.  Mild expressive aphasia A&O to name only Motor: 4+/5 throughout  Skin: Skin is warm and dry.  Psychiatric: His affect is blunt. His speech is delayed. He is slowed.    Results for orders placed or performed during the hospital encounter of 10/02/17 (from the past 48 hour(s))  Glucose, capillary     Status: Abnormal   Collection Time: 10/05/17 11:48 AM  Result Value Ref Range   Glucose-Capillary 132 (H) 65 - 99 mg/dL   Comment 1 Notify RN    Comment 2 Document in Chart   Glucose, capillary     Status: Abnormal   Collection Time: 10/05/17  5:04 PM  Result Value Ref Range   Glucose-Capillary 109 (H) 65 - 99 mg/dL   Comment 1 Notify RN    Comment 2 Document in Chart   Glucose, capillary     Status: Abnormal   Collection Time: 10/06/17  6:26 AM  Result Value Ref Range   Glucose-Capillary 128 (H) 65 - 99 mg/dL   Comment 1 Notify RN    Comment 2 Document in Chart   Glucose, capillary     Status: Abnormal    Collection Time: 10/06/17 11:40 AM  Result Value Ref Range   Glucose-Capillary 156 (H) 65 - 99 mg/dL  Glucose, capillary     Status: Abnormal   Collection Time: 10/06/17  5:00 PM  Result Value Ref Range   Glucose-Capillary 137 (H) 65 - 99 mg/dL  Glucose, capillary     Status: Abnormal   Collection Time: 10/06/17  9:31 PM  Result Value Ref Range   Glucose-Capillary 192 (H) 65 - 99 mg/dL   Comment 1 Notify RN    Comment 2 Document in Chart   CBC     Status: Abnormal   Collection Time: 10/07/17  5:09 AM  Result Value Ref Range   WBC 9.1 4.0 - 10.5 K/uL   RBC 4.00 (L) 4.22 - 5.81 MIL/uL   Hemoglobin 12.4 (L) 13.0 - 17.0 g/dL   HCT 38.8 (L) 39.0 - 52.0 %   MCV 97.0 78.0 - 100.0 fL   MCH 31.0 26.0 - 34.0 pg   MCHC 32.0 30.0 - 36.0 g/dL   RDW 13.3 11.5 - 15.5 %   Platelets 295 150 - 400 K/uL  Basic metabolic panel     Status: Abnormal   Collection Time: 10/07/17  5:09 AM  Result Value Ref Range   Sodium 138 135 - 145 mmol/L   Potassium 3.8 3.5 - 5.1 mmol/L   Chloride 104 101 - 111 mmol/L   CO2 25 22 - 32 mmol/L   Glucose, Bld 144 (H) 65 - 99 mg/dL   BUN 17 6 - 20 mg/dL   Creatinine, Ser 1.13 0.61 - 1.24 mg/dL   Calcium 8.6 (L) 8.9 - 10.3 mg/dL   GFR calc non Af Amer 57 (L) >60 mL/min   GFR calc Af Amer >60 >60 mL/min    Comment: (NOTE) The eGFR has been calculated using the CKD EPI equation. This calculation has not been validated in all clinical situations. eGFR's persistently <60 mL/min signify possible Chronic Kidney Disease.    Anion gap 9 5 - 15  Magnesium     Status: None   Collection Time: 10/07/17  5:09 AM  Result Value Ref Range   Magnesium 1.8 1.7 - 2.4 mg/dL  Phosphorus     Status: None  Collection Time: 10/07/17  5:09 AM  Result Value Ref Range   Phosphorus 3.3 2.5 - 4.6 mg/dL  Glucose, capillary     Status: Abnormal   Collection Time: 10/07/17  6:13 AM  Result Value Ref Range   Glucose-Capillary 115 (H) 65 - 99 mg/dL   Comment 1 Notify RN    Comment 2  Document in Chart    No results found.     Medical Problem List and Plan: 1.  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 2.  DVT Prophylaxis/Anticoagulation: Eliquis 3. Pain Management: Tylenol as needed  4. Mood: Provide emotional support  5. Neuropsych: This patient is  capable of making decisions on his  own behalf. 6. Skin/Wound Care: Routine skin checks  7. Fluids/Electrolytes/Nutrition: Routine I&O's with follow-up chemistries 8. Atrial fibrillation. Cardiac rate controlled. Continue Eliquis 9. Diabetes mellitus peripheral neuropathy. Hemoglobin A1c 7.8. SSI. Patient on Glucotrol 2.5 mg daily prior to admission. Resume as needed 10. Hypertension. Permissive hypertension and monitor Lopressor 75 mg twice a day, Lasix 20 mg daily 11. Hypothyroidism. TSH 4.307. Continue Synthroid 12. Hyperlipidemia. Lipitor 13. Obesity  Body mass index is 31.1 kg/m.  Diet and exercise education  Encourage weight loss to increase endurance and promote overall health  Post Admission Physician Evaluation: 1. Preadmission assessment reviewed and changes made below. 2. 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 3. Patient is admitted to receive collaborative, interdisciplinary care between the physiatrist, rehab nursing staff, and therapy team. 4. Patient's level of medical complexity and substantial therapy needs in context of that medical necessity cannot be provided at a lesser intensity of care such as a SNF. 5. Patient has experienced substantial functional loss from his/her baseline which was documented above under the "Functional History" and "Functional Status" headings.  Judging by the patient's diagnosis, physical exam, and functional history, the patient has potential for functional progress which will result in measurable gains while on inpatient rehab.  These gains will be of  substantial and practical use upon discharge  in facilitating mobility and self-care at the household level. 6. Physiatrist will provide 24 hour management of medical needs as well as oversight of the therapy plan/treatment and provide guidance as appropriate regarding the interaction of the two. 7. 24 hour rehab nursing will assist with safety, disease management, medication administration and patient education  and help integrate therapy concepts, techniques,education, etc. PT will assess and treat for/with: Lower extremity strength, range of motion, stamina, balance, functional mobility, safety, adaptive techniques and equipment, coping skills, pain control, stroke education.   Goals are: Supervision. 8. OT will assess and treat for/with: ADL's, functional mobility, safety, upper extremity strength, adaptive techniques and equipment, ego support, and community reintegration.   Goals are: Supervision. Therapy may proceed with showering this patient. 9. SLP will assess and treat for/with: speech.  Goals are: Mod I. 10. Case Management and Social Worker will assess and treat for psychological issues and discharge planning. 11. Team conference will be held weekly to assess progress toward goals and to determine barriers to discharge. 12. Patient will receive at least 3 hours of therapy per day at least 5 days per week. 13. ELOS: 10-14 days.       14. Prognosis:  good  Delice Lesch, MD, ABPMR Lavon Paganini Angiulli, PA-C 10/07/2017

## 2017-10-07 NOTE — Discharge Summary (Signed)
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 Allergic rhinitis   . Asthmatic bronchitis   . Atrial fibrillation ([place])   . Celiac disease   . Cellulitis   . Diverticulosis   . DM (diabetes mellitus) ([place])   . Fx. left wrist 1987  . Gastric polyps   . GERD (gastroesophageal reflux disease)   . History of pneumonia    w/pleural effusion  . Hypothyroidism   . Iron deficiency anemia   . Melanoma ([place])   . Neuropathy   . Prostate cancer ([place])   . REM sleep behavior disorder   . RLS (restless legs syndrome)   . Sleep apnea    Past Surgical History:  Procedure Laterality Date  . APPENDECTOMY    . CARPAL TUNNEL RELEASE    . CATARACT EXTRACTION  03/2011   bilateral   . CHOLECYSTECTOMY  2001  . COLONOSCOPY W/ BIOPSIES  04/27/2008   diverticulosis, duodenitis  . FOOT SURGERY    . HERNIA REPAIR  1994   bilateral  . KNEE SURGERY     x 2  . LUNG SURGERY  2001  . PROSTATECTOMY  2002  . UPPER GASTROINTESTINAL ENDOSCOPY  04/27/2008   celiac disease, gastric polyps   Medications to be continued on Rehab . apixaban  5 mg Oral BID  . atorvastatin  40 mg Oral q1800  . doxycycline  100 mg Oral Q12H  . furosemide  20 mg Oral Daily  . insulin aspart  0-9 Units Subcutaneous TID WC  . levothyroxine  150 mcg Oral QAC breakfast  . metoprolol tartrate  75 mg Oral BID   . pantoprazole  40 mg Oral QHS   LABORATORY STUDIES CBC    Component Value Date/Time   WBC 9.1 10/07/2017 0509   RBC 4.00 (L) 10/07/2017 0509   HGB 12.4 (L) 10/07/2017 0509   HCT 38.8 (L) 10/07/2017 0509   PLT 295 10/07/2017 0509   MCV 97.0 10/07/2017 0509   MCH 31.0 10/07/2017 0509   MCHC 32.0 10/07/2017 0509   RDW 13.3 10/07/2017 0509   LYMPHSABS 1.7 10/02/2017 1942   MONOABS 1.0 10/02/2017 1942   EOSABS 0.4 10/02/2017 1942   BASOSABS 0.0 10/02/2017 1942   CMP    Component Value Date/Time   NA 138 10/07/2017 0509   NA 141 05/15/2017 1506   K 3.8 10/07/2017 0509   CL 104 10/07/2017 0509   CO2 25 10/07/2017 0509   GLUCOSE 144 (H) 10/07/2017 0509   BUN 17 10/07/2017 0509   BUN 37 (H) 05/15/2017 1506   CREATININE 1.13 10/07/2017 0509   CREATININE 1.54 (H) 03/12/2017 1148   CALCIUM 8.6 (L) 10/07/2017 0509   PROT 6.7 10/04/2017 0904   ALBUMIN 3.4 (L) 10/04/2017 0904   AST 25 10/04/2017 0904   ALT 20 10/04/2017 0904   ALKPHOS 91 10/04/2017 0904  BILITOT 0.8 10/04/2017 0904   GFRNONAA 57 (L) 10/07/2017 0509   GFRNONAA 41 (L) 03/12/2017 1148   GFRAA >60 10/07/2017 0509   GFRAA 47 (L) 03/12/2017 1148   COAGS Lab Results  Component Value Date   INR 1.02 10/02/2017   Lipid Panel    Component Value Date/Time   CHOL 160 10/03/2017 0833   TRIG 164 (H) 10/03/2017 0833   HDL 37 (L) 10/03/2017 0833   CHOLHDL 4.3 10/03/2017 0833   VLDL 33 10/03/2017 0833   LDLCALC 90 10/03/2017 0833   HgbA1C  Lab Results  Component Value Date   HGBA1C 7.8 (H) 10/03/2017   Urinalysis    Component Value Date/Time   COLORURINE YELLOW 10/02/2017 2119   APPEARANCEUR CLEAR 10/02/2017 2119   LABSPEC 1.032 (H) 10/02/2017 2119   PHURINE 5.0 10/02/2017 2119   GLUCOSEU NEGATIVE 10/02/2017 2119   HGBUR NEGATIVE 10/02/2017 2119   [place] NEGATIVE 10/02/2017 2119   [place] NEGATIVE 10/02/2017 2119   PROTEINUR NEGATIVE 10/02/2017 2119   NITRITE NEGATIVE 10/02/2017 2119    LEUKOCYTESUR NEGATIVE 10/02/2017 2119   Urine Drug Screen     Component Value Date/Time   LABOPIA NONE DETECTED 10/02/2017 2119   COCAINSCRNUR NONE DETECTED 10/02/2017 2119   LABBENZ NONE DETECTED 10/02/2017 2119   AMPHETMU NONE DETECTED 10/02/2017 2119   THCU NONE DETECTED 10/02/2017 2119   LABBARB NONE DETECTED 10/02/2017 2119    Alcohol Level No results found for: [facility]  SIGNIFICANT DIAGNOSTIC STUDIES   &   HISTORY OF PRESENT ILLNESS Ct Angio Head W Or Wo Contrast 10/02/2017 IMPRESSION:  No flow reducing intracranial stenosis is observed. No abnormal postcontrast enhancement. MRI provides increased sensitivity in the detection of acute infarction, and could be performed as clinically indicated.   Ct Head Code Stroke Wo Contrast 10/02/2017 IMPRESSION:  1. Atrophy and small vessel disease. No acute intracranial findings.  2. ASPECTS is 10   MR Brain Wo Contrast - Acute LEFT MCA territory infarct, primarily temporal lobe involvement, likely a single LEFT M3 branch,  CT Head Wo Contrast - Cytotoxic edema at the site of known left temporal lobe infarct. No hemorrhage or mass effect.  Transthoracic Echocardiogram - Left ventricle: The cavity size was normal. Wall thickness was increased in a pattern of mild LVH. Systolic function was normal. The estimated ejection fraction was in the range of 60% to 65  Bilateral Carotid Dopplers - no significant extracranial stenosis bilaterally  EEG  10/04/17 Impression: This is an abnormal EEG due to background slowing/disorganization which is slightly more prominent over the left hemisphere, likely corresponding with patients recent stroke. Frequent background muscle artifact is noted, corresponding with patients mouth movements. The background slowing is a non-specific finding that can be seen with toxic, metabolic, diffuse, or multifocal structural process. No definite epileptiform changes were noted.A single EEG without  epileptiform changes does not exclude the diagnosis of epilepsy. Clinical correlation advised.  HOSPITAL COURSE [name] is a 83 y.o. male with history of sleep apnea, prostate cancer, neuropathy, hypothyroidism, diabetes mellitus, and atrial fibrillation without anticoagulation secondary to multiple falls presenting with aphasia.  tPA 10/02/2017 at 8.25 pm  Stroke:  Follow-up imaging pending  Resultant  Mild mixed aphasia  CT head - Atrophy and small vessel disease. No acute intracranial findings.   MRI head -acute left MCA stroke  MRA head - not performed  CTA Head - No flow reducing intracranial stenosis is observed.   Carotid Doppler -negative 2D Echo - Left ventricle:  The cavity size was normal. Wall thickness was increased in a pattern of mild LVH. Systolic function was normal.  The estimated ejection fraction was in the range of 60% to 65  LDL - 90  HgbA1c - 7.8  VTE prophylaxis - SCDs  Diet gluten free Room service appropriate? Yes; Fluid consistency: Thin  aspirin 81 mg daily prior to admission, now on No antithrombotic S/P TPA  Patient will be counseled to be compliant with his antithrombotic medications  Ongoing aggressive stroke risk factor management  Therapy recommendations: CIR Disposition: CIR   10/05/17: Continue mobilize out of bed and therapy consults. Transferred for rehabilitation to step in the next couple of days. Will change from aspirin to eliquis at the time of discharge. Long discussion with patient, wife and daughter regarding plan of care and discussion of risk-benefit of anticoagulation for stroke prevention due to A. fib versus bleeding risk.   10/06/2017: Neuro exam remains stable.  Patient continues to have expressive and receptive aphasia.  Working with therapies.  Encouraged to sing songs that he knows by heart.  Patient appears frustrated by condition but looking forward to going to CIR in the morning.  Will restart  some of his home medications today.  Aspirin 81 mg discontinued will continue on Eliquis.  10/07/2017: Neuro exam remains stable. Expressive/receptive aphasia improving slowly.  Working with therapies - walked in hallways with PT this AM with walker support. Patient c/o diarrhea this morning, overnight. States had some yesterday too but did not mention to anyone. Voices no other complaints. AM Labs wnl. Remains afebrile. B/P's stable. Will send stool testing and check TSH. Accepted to CIR today.   Diarrhea Stool sample to be sent for CDiff testing  Hypothyroidism Home dose of Synthroid restarted 10/07/2007 TSH 4.307 on 10/04/17 Repeat TSH 12/26 - PENDING  Possible Congestive heart failure Home dose of Lasix 20 mg daily restarted on 10/06/2017 Daily weights to be documented in electronic chart - not done today CIR to monitor need for additional doses of Lasix  Hypertension  Stable              Permissive hypertension (OK if < 220/120) but gradually normalize in 5-7 days              Long-term BP goal normotensive  Metoprolol restarted twice daily  Micardis not restarted, for now, CIR to monitor  Hyperlipidemia  Home meds:  No lipid lowering medications prior to admission  LDL 90 goal < 70  Add Lipitor 40 mg daily on 10/06/2017  Diabetes  HgbA1c7.8, goal < 7.0  Controlled  NovoLog as needed  Other Stroke Risk Factors  Advanced age  Obesity, Body mass index is 30.8 kg/m., recommend weight loss, diet and exercise as appropriate   Obstructive sleep apnea  Atrial fibrillation  Other Active Problems  Acute kidney injury - BUN 17 ; creatinine 1.13-stable  Anemia of chronic disease-stable  Plan / Recommendations Discharge to CIR in a.m.  DISCHARGE EXAM Blood pressure (!) 148/56, pulse 71, temperature 98.1 F (36.7 C), temperature source Oral, resp. rate 19, height 6' (1.829 m), weight 104 kg (229 lb 4.5 oz), SpO2 98 %. PHYSICAL EXAM Physical  exam: Exam: Gen: NAD Eyes: anicteric sclerae, moist conjunctivae                    CV: no MRG, no carotid bruits, no peripheral edema Neuro:Mental Status: Alert, follows simple commands, pleasant Speech:  Mild  expressive aphasia with difficulty with naming  and repetition. Nonfluent speech. Good comprehension Cranial Nerves:  The pupils are equal, round, and reactive to light.  EOMI. No gaze preference. Visual fields full. Face symmetric, Tongue midline. Hearing intact to voice. Shoulder shrug intact Motor Observation:    no involuntary movements noted. Tone appears normal.  Strength:    5/5 Sensation:  Intact to LT Plantars downgoing.   Discharge Diet  Diet gluten free Room service appropriate? Yes; Fluid consistency: Thin liquids  DISCHARGE PLAN  Disposition:  Transfer to [facility] for ongoing PT, OT and ST  Eliquis (apixaban) daily and Lipitor 40 mg for secondary stroke prevention.  Recommend ongoing risk factor control by Primary Care Physician at time of discharge from inpatient rehabilitation.  Follow-up Celene Squibb, MD in 2 weeks following discharge from rehab.  Follow-up with Dr. Antony Contras, Stroke Clinic in 6 weeks Garvin Fila, MD  Neurology Radiology [phone] [place] [id number]   Greater than 30 minutes were spent preparing discharge.  Mary Sella, ANP-C Neurology stroke team 10/07/2017 12:02 PM  I have personally examined this patient, reviewed notes, independently viewed imaging studies, participated in medical decision making and plan of care.ROS completed by me personally and pertinent positives fully documented  I have made any additions or clarifications directly to the above note. Agree with note above.   Antony Contras, MD Medical Director [facility] Pager: [phone] 10/07/2017 1:56 PM

## 2017-10-07 NOTE — Care Management Note (Signed)
Case Management Note  Patient Details  Name: [name] MRN: [id number] Date of Birth: [birth year]  Subjective/Objective:                    Action/Plan: Pt discharging to CIR no further needs per CM.    Expected Discharge Date:  10/07/17               Expected Discharge Plan:  [place]  In-House Referral:     Discharge planning Services  CM Consult  Post Acute Care Choice:    Choice offered to:     DME Arranged:    DME Agency:     HH Arranged:    HH Agency:     Status of Service:  Completed, signed off  If discussed at [employer], dates discussed:    Additional Comments:  Pollie Friar, RN 10/07/2017, 12:50 PM

## 2017-10-07 NOTE — Care Management Important Message (Signed)
Important Message  Patient Details  Name: [name] MRN: [id number] Date of Birth: 04-Aug-1929   Medicare Important Message Given:  Yes    Avaiah Stempel 10/07/2017, 2:25 PM

## 2017-10-07 NOTE — Progress Notes (Signed)
Occupational Therapy Treatment Patient Details Name: [name] MRN: [id number] DOB: Sep 20, 1929 Today's Date: 10/07/2017    History of present illness [name] is an 83 y.o. male Caucasian right-handed past medical history of atrial fibrillation not on anticoagulation, diabetes, and hypothyroidism presented to [facility] ER as a stroke alert with aphasia. MRI revealed acute L MCA territory infarct primarily temporal loe involvement.   OT comments  Pt required mod assist +2 for sit to stand and min assist +2 for safety with functional mobility. Pt able to complete grooming task standing at the sink with min assist for balance. D/c plan remains appropriate. Will continue to follow acutely.   Follow Up Recommendations  CIR;Supervision/Assistance - 24 hour    Equipment Recommendations  Other (comment)(TBD at next venue of care)    Recommendations for Other Services      Precautions / Restrictions Precautions Precautions: Fall Restrictions Weight Bearing Restrictions: No       Mobility Bed Mobility Overal bed mobility: Needs Assistance Bed Mobility: Supine to Sit     Supine to sit: Min guard     General bed mobility comments: HOB slightly elevated with use of bed rails. Increased time and effort.  Transfers Overall transfer level: Needs assistance Equipment used: Rolling walker (2 wheeled) Transfers: Sit to/from Stand Sit to Stand: Mod assist;+2 physical assistance         General transfer comment: for sit to stand from EOB, min assist +2 for sit to stand from chair. Cues for hand placement and technique.    Balance Overall balance assessment: Needs assistance Sitting-balance support: Feet supported;No upper extremity supported Sitting balance-Leahy Scale: Good     Standing balance support: Single extremity supported;During functional activity Standing balance-Leahy Scale: Poor Standing balance comment: Min assist for grooming task at sink.                            ADL either performed or assessed with clinical judgement   ADL Overall ADL's : Needs assistance/impaired     Grooming: Minimal assistance;Standing;Wash/dry face;Oral care                   Toilet Transfer: Moderate assistance;+2 for physical assistance;Ambulation;RW Toilet Transfer Details (indicate cue type and reason): Simulated by sit to stand from EOB with functional mobility. Mod assist +2 for sit to stand, min assist for functional mobility.         Functional mobility during ADLs: Minimal assistance;+2 for physical assistance;+2 for safety/equipment;Rolling walker       Vision       Perception     Praxis      Cognition Arousal/Alertness: Awake/alert Behavior During Therapy: WFL for tasks assessed/performed Overall Cognitive Status: Impaired/Different from baseline Area of Impairment: Following commands;Safety/judgement;Problem solving;Orientation                 Orientation Level: Disoriented to;Time     Following Commands: Follows one step commands with increased time;Follows multi-step commands inconsistently Safety/Judgement: Decreased awareness of deficits   Problem Solving: Slow processing;Decreased initiation;Requires verbal cues          Exercises     Shoulder Instructions       General Comments      Pertinent Vitals/ Pain       Pain Assessment: No/denies pain  Home Living  Prior Functioning/Environment              Frequency  Min 3X/week        Progress Toward Goals  OT Goals(current goals can now be found in the care plan section)  Progress towards OT goals: Progressing toward goals  Acute Rehab OT Goals Patient Stated Goal: rehab today OT Goal Formulation: With patient  Plan Discharge plan remains appropriate    Co-evaluation    PT/OT/SLP Co-Evaluation/Treatment: Yes Reason for Co-Treatment: For patient/therapist  safety;To address functional/ADL transfers   OT goals addressed during session: ADL's and self-care      AM-PAC PT "6 Clicks" Daily Activity     Outcome Measure   Help from another person eating meals?: A Little Help from another person taking care of personal grooming?: A Little Help from another person toileting, which includes using toliet, bedpan, or urinal?: A Lot Help from another person bathing (including washing, rinsing, drying)?: A Lot Help from another person to put on and taking off regular upper body clothing?: A Little Help from another person to put on and taking off regular lower body clothing?: A Lot 6 Click Score: 15    End of Session Equipment Utilized During Treatment: Gait belt  OT Visit Diagnosis: Other abnormalities of gait and mobility (R26.89);Muscle weakness (generalized) (M62.81);Cognitive communication deficit (R41.841) Symptoms and signs involving cognitive functions: Cerebral infarction   Activity Tolerance Patient tolerated treatment well   Patient Left in chair;with call bell/phone within reach   Nurse Communication Mobility status        Time: 0867-6195 OT Time Calculation (min): 24 min  Charges: OT General Charges $OT Visit: 1 Visit OT Treatments $Self Care/Home Management : 8-22 mins  Mikailah Morel A. Ulice Brilliant, M.S., OTR/L Pager: [place] 10/07/2017, 10:36 AM

## 2017-10-07 NOTE — Progress Notes (Signed)
Rehab admissions - I have approval and will admit to acute inpatient rehab today.  Patient and family are agreeable.  Has been cleared by attending MD for rehab admit today.  Call me for questions.  #[phone]

## 2017-10-07 NOTE — Discharge Instructions (Signed)

## 2017-10-07 NOTE — Progress Notes (Addendum)
Orders were also received to test for c-diff and for enteric precautions from PA on call.

## 2017-10-07 NOTE — Progress Notes (Signed)
Physical Therapy Treatment Patient Details Name: [name] MRN: [id number] DOB: November 26, 1928 Today's Date: 10/07/2017    History of Present Illness [name] is an 83 y.o. male Caucasian right-handed past medical history of atrial fibrillation not on anticoagulation, diabetes, and hypothyroidism presented to [facility] ER as a stroke alert with aphasia. MRI revealed acute L MCA territory infarct primarily temporal loe involvement.    PT Comments    Patient progressing well towards PT goals. Expressive aphasia seems improved but still having word finding difficulties. Improved ambulation distance today with Min A for balance/safety. Pt has no feeling in BLEs and feet. Continues to require Mod A to stand from low surfaces. Eager to get to rehab. Will follow.   Follow Up Recommendations  CIR     Equipment Recommendations  Rolling walker with 5" wheels;3in1 (PT)    Recommendations for Other Services       Precautions / Restrictions Precautions Precautions: Fall Restrictions Weight Bearing Restrictions: No    Mobility  Bed Mobility Overal bed mobility: Needs Assistance Bed Mobility: Supine to Sit     Supine to sit: Min guard     General bed mobility comments: HOB slightly elevated with use of bed rails. Increased time and effort.  Transfers Overall transfer level: Needs assistance Equipment used: Rolling walker (2 wheeled) Transfers: Sit to/from Stand Sit to Stand: Mod assist;+2 physical assistance         General transfer comment: for sit to stand from EOB, min assist +2 for sit to stand from chair. Cues for hand placement and technique.  Ambulation/Gait Ambulation/Gait assistance: Min assist;+2 safety/equipment Ambulation Distance (Feet): 60 Feet Assistive device: Rolling walker (2 wheeled) Gait Pattern/deviations: Step-to pattern;Decreased stride length;Trunk flexed;Narrow base of support;Step-through pattern;Decreased stance time - right;Decreased  dorsiflexion - right Gait velocity: slow   General Gait Details: Cues for step through gait and upright. Pt looks down due to decreased sensation in BLEs and into feet.    Stairs            Wheelchair Mobility    Modified Rankin (Stroke Patients Only) Modified Rankin (Stroke Patients Only) Pre-Morbid Rankin Score: Slight disability Modified Rankin: Moderately severe disability     Balance Overall balance assessment: Needs assistance Sitting-balance support: Feet supported;No upper extremity supported Sitting balance-Leahy Scale: Good     Standing balance support: Single extremity supported;During functional activity Standing balance-Leahy Scale: Poor Standing balance comment: Min assist for grooming task at sink. Able to brush teeth with UE support.                             Cognition Arousal/Alertness: Awake/alert Behavior During Therapy: WFL for tasks assessed/performed Overall Cognitive Status: Impaired/Different from baseline Area of Impairment: Following commands;Safety/judgement;Problem solving;Orientation                 Orientation Level: Disoriented to;Time(December but not the year)     Following Commands: Follows one step commands with increased time;Follows multi-step commands inconsistently Safety/Judgement: Decreased awareness of deficits   Problem Solving: Slow processing;Requires verbal cues General Comments: Some word finding difficulties noted.       Exercises      General Comments        Pertinent Vitals/Pain Pain Assessment: No/denies pain    Home Living                      Prior Function  PT Goals (current goals can now be found in the care plan section) Acute Rehab PT Goals Patient Stated Goal: rehab today Progress towards PT goals: Progressing toward goals    Frequency    Min 3X/week      PT Plan Current plan remains appropriate    Co-evaluation PT/OT/SLP  Co-Evaluation/Treatment: Yes Reason for Co-Treatment: For patient/therapist safety;To address functional/ADL transfers PT goals addressed during session: Mobility/safety with mobility;Balance;Strengthening/ROM OT goals addressed during session: ADL's and self-care      AM-PAC PT "6 Clicks" Daily Activity  Outcome Measure  Difficulty turning over in bed (including adjusting bedclothes, sheets and blankets)?: None Difficulty moving from lying on back to sitting on the side of the bed? : None Difficulty sitting down on and standing up from a chair with arms (e.g., wheelchair, bedside commode, etc,.)?: A Little Help needed moving to and from a bed to chair (including a wheelchair)?: A Little Help needed walking in hospital room?: A Little Help needed climbing 3-5 steps with a railing? : A Lot 6 Click Score: 19    End of Session Equipment Utilized During Treatment: Gait belt Activity Tolerance: Patient tolerated treatment well Patient left: in chair;with call bell/phone within reach Nurse Communication: Mobility status PT Visit Diagnosis: Unsteadiness on feet (R26.81);Other abnormalities of gait and mobility (R26.89);Other symptoms and signs involving the nervous system (U43.838)     Time: 1840-3754 PT Time Calculation (min) (ACUTE ONLY): 25 min  Charges:  $Gait Training: 8-22 mins                    G Codes:       Wray Kearns, PT, DPT [phone]     Marguarite Arbour A [place] 10/07/2017, 11:08 AM

## 2017-10-07 NOTE — Progress Notes (Signed)
Patient received at approximately 1640 via bed alert and oriented x 4. Patient and family oriented to room and call bell system. Patient noted with word finding difficulties. Patient and family verbalized understanding of admission process. Continue with plan of care.  [name]

## 2017-10-08 ENCOUNTER — Inpatient Hospital Stay (HOSPITAL_COMMUNITY): Payer: PPO | Admitting: Speech Pathology

## 2017-10-08 ENCOUNTER — Inpatient Hospital Stay (HOSPITAL_COMMUNITY): Payer: PPO

## 2017-10-08 ENCOUNTER — Inpatient Hospital Stay (HOSPITAL_COMMUNITY): Payer: PPO | Admitting: Physical Therapy

## 2017-10-08 ENCOUNTER — Inpatient Hospital Stay (HOSPITAL_COMMUNITY): Payer: PPO | Admitting: Occupational Therapy

## 2017-10-08 DIAGNOSIS — I69320 Aphasia following cerebral infarction: Secondary | ICD-10-CM

## 2017-10-08 DIAGNOSIS — I63512 Cerebral infarction due to unspecified occlusion or stenosis of left middle cerebral artery: Secondary | ICD-10-CM

## 2017-10-08 LAB — COMPREHENSIVE METABOLIC PANEL
ALK PHOS: 87 U/L (ref 38–126)
ALT: 33 U/L (ref 17–63)
AST: 37 U/L (ref 15–41)
Albumin: 3.3 g/dL — ABNORMAL LOW (ref 3.5–5.0)
Anion gap: 11 (ref 5–15)
BUN: 20 mg/dL (ref 6–20)
CALCIUM: 8.8 mg/dL — AB (ref 8.9–10.3)
CHLORIDE: 102 mmol/L (ref 101–111)
CO2: 24 mmol/L (ref 22–32)
CREATININE: 1.21 mg/dL (ref 0.61–1.24)
GFR calc Af Amer: >60 mL/min (ref >60)
GFR, EST NON AFRICAN AMERICAN: 52 mL/min — AB (ref >60)
Glucose, Bld: 202 mg/dL — ABNORMAL HIGH (ref 65–99)
Potassium: 3.5 mmol/L (ref 3.5–5.1)
Sodium: 137 mmol/L (ref 135–145)
Total Bilirubin: 0.8 mg/dL (ref 0.3–1.2)
Total Protein: 7 g/dL (ref 6.5–8.1)

## 2017-10-08 LAB — CBC WITH DIFFERENTIAL/PLATELET
BASOS ABS: 0 10*3/uL (ref 0.0–0.1)
Basophils Relative: 0 %
EOS PCT: 3 %
Eosinophils Absolute: 0.3 10*3/uL (ref 0.0–0.7)
HCT: 38.2 % — ABNORMAL LOW (ref 39.0–52.0)
HEMOGLOBIN: 12.6 g/dL — AB (ref 13.0–17.0)
LYMPHS ABS: 1.7 10*3/uL (ref 0.7–4.0)
LYMPHS PCT: 17 %
MCH: 31.9 pg (ref 26.0–34.0)
MCHC: 33 g/dL (ref 30.0–36.0)
MCV: 96.7 fL (ref 78.0–100.0)
Monocytes Absolute: 1 10*3/uL (ref 0.1–1.0)
Monocytes Relative: 10 %
NEUTROS ABS: 6.9 10*3/uL (ref 1.7–7.7)
NEUTROS PCT: 70 %
PLATELETS: 309 10*3/uL (ref 150–400)
RBC: 3.95 MIL/uL — AB (ref 4.22–5.81)
RDW: 13.3 % (ref 11.5–15.5)
WBC: 9.9 10*3/uL (ref 4.0–10.5)

## 2017-10-08 LAB — GLUCOSE, CAPILLARY
GLUCOSE-CAPILLARY: 115 mg/dL — AB (ref 65–99)
GLUCOSE-CAPILLARY: 105 mg/dL — AB (ref 65–99)
Glucose-Capillary: 101 mg/dL — ABNORMAL HIGH (ref 65–99)
Glucose-Capillary: 133 mg/dL — ABNORMAL HIGH (ref 65–99)
Glucose-Capillary: 132 mg/dL — ABNORMAL HIGH (ref 65–99)

## 2017-10-08 NOTE — Progress Notes (Signed)
Pt has had no BM this shift.  Pt has had lots of gas. Pt reports that has BM's when going to restroom.  Pt given Sorbitol for constipation.

## 2017-10-08 NOTE — Progress Notes (Signed)
Social Work Assessment and Plan Social Work Assessment and Plan  Patient Details  Name: [name] MRN: [id number] Date of Birth: December 24, 1928  Today's Date: 10/08/2017  Problem List:  Patient Active Problem List   Diagnosis Date Noted  . 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 Aphasia   . PAF (paroxysmal atrial fibrillation) ([place])   . Diabetes mellitus type 2 in obese ([place])   . Benign essential HTN   . Hypothyroidism   . Hyperlipidemia   . Stroke (cerebrum) ([place]) 10/02/2017  . Afib ([place]) 01/10/2017  . SOB (shortness of breath) 01/10/2017  . Acute diastolic CHF (congestive heart failure) ([place]) 01/10/2017  . Atrial fibrillation with RVR ([place]) 01/10/2017  . Claudication ([place]) 07/25/2016  . Paroxysmal atrial fibrillation ([place]) 07/25/2016  . Odynophagia 04/10/2014  . Allergic rhinitis 03/23/2014  . Chronic rhinitis 12/22/2013  . 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 Constipation 08/22/2011  . Obesity 09/20/2009  . G E R D 11/29/2007  . Celiac disease 11/29/2007   Past Medical History:  Past Medical History:  Diagnosis Date  . Allergic rhinitis   . Asthmatic bronchitis   . Atrial fibrillation ([place])   . Celiac disease   . Cellulitis   . Diverticulosis   . DM (diabetes mellitus) ([place])   . Fx. left wrist 1987  . Gastric polyps   . GERD (gastroesophageal reflux disease)   . History of pneumonia    w/pleural effusion  . Hypothyroidism   . Iron deficiency anemia   . Melanoma ([place])   . Neuropathy   . Prostate cancer ([place])   . REM sleep behavior disorder   . RLS (restless legs syndrome)   . Sleep apnea    Past Surgical History:  Past Surgical History:  Procedure Laterality Date  . APPENDECTOMY    . CARPAL TUNNEL RELEASE    . CATARACT EXTRACTION  03/2011   bilateral   . CHOLECYSTECTOMY  2001  . COLONOSCOPY W/ BIOPSIES  04/27/2008   diverticulosis, duodenitis  . FOOT SURGERY    . HERNIA REPAIR  1994   bilateral  . KNEE SURGERY     x 2  . LUNG SURGERY   2001  . PROSTATECTOMY  2002  . UPPER GASTROINTESTINAL ENDOSCOPY  04/27/2008   celiac disease, gastric polyps   Social History:  reports that  has never smoked. he has never used smokeless tobacco. He reports that he does not drink alcohol or use drugs.  Family / Support Systems Marital Status: Married Patient Roles: Spouse, Parent, Other (Comment)(pastor) Spouse/Significant Other: Mary [phone] Children: nicole check-granddaughter-[phone]-cell Other Supports: Three children-spread out Anticipated Caregiver: Wife Ability/Limitations of Caregiver: Wife is in good health but is small in stature Caregiver Availability: 24/7 Family Dynamics: Close knit family who are supportive and involved with one another. 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 Wife is also very involved in the church.  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    Abuse/Neglect Abuse/Neglect Assessment Can Be Completed: Yes Physical Abuse: Denies Verbal Abuse: Denies Sexual Abuse: Denies Exploitation of patient/patient's resources: Denies Self-Neglect: Denies  Emotional Status Pt's affect, behavior adn adjustment  status: Pt is motivated to do well and return to pastoring his church. He has always been independent and able to care for himself and others. He is not used to this role nor does he like it much.  Recent Psychosocial Issues: other health issues were managed before this Pyschiatric History: No history or issues. 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 He relies upon his faith and feels he will do what he needs to do here and recover. He is encouraged by the therapy team's goals for him and short length of stay Substance Abuse History: No issues  Patient / Family Perceptions, Expectations & Goals Pt/Family understanding of illness & functional limitations: Pt and wife can explain his stroke and deficits. They both have spoken with the MD and feel they have a good understanding of where they are going from here. He is hopeful to just be here a short time. Premorbid pt/family roles/activities: Father, husband, pastor, grandfather, friend, etc Anticipated changes in roles/activities/participation: resume Pt/family expectations/goals: Pt states: " I hope to be independent and take care of myself."  Wife states: " I hope he does well here and gets mobile I am small but will do my best."  [employer]: None Premorbid Home Care/DME Agencies: Other (Comment)(rolling walker and cane) Transportation available at discharge: Wife Resource referrals recommended: Support group (specify)  Discharge Planning Living Arrangements: Spouse/significant other Support Systems: Spouse/significant other, Children, Other relatives, Friends/neighbors, Church/faith community Type of Residence: Private residence Insurance Resources: Multimedia programmer (specify)(healthteam advantage) Financial Resources: Employment, Radio broadcast assistant Screen Referred: No Living Expenses: Own Money Management: Spouse, Patient Does the patient have any problems obtaining your medications?: No Home Management: Wife and pt Patient/Family Preliminary Plans: Return home with wife who can provide care if needed. She plans to be here to observe him in therapies to see his progress. Discussed team conference and goals, pt voiced team feels it will be short. Will work on discharge needs. Social Work Anticipated Follow Up Needs:  HH/OP, Support Group  Clinical Impression Pleasant gentleman who is motivated to do well and is in fact doing well except his word finding. His wife and family is supportive and involved. Wife can provdie assist if needed. Await team's evaluation and work on discharge needs.  Elease Hashimoto 10/08/2017, 1:19 PM

## 2017-10-08 NOTE — Progress Notes (Signed)
Calvin Diones, RN  Rehab Admission Coordinator  Physical Medicine and Rehabilitation  PMR Pre-admission  Signed  Date of Service:  10/05/2017 3:52 PM       Related encounter: ED to Hosp-Admission (Discharged) from 10/02/2017 in [facility]      Signed            [] Hide copied text  [] Hover for details   PMR Admission Coordinator Pre-Admission Assessment  Patient: [name] is an 83 y.o., male MRN: [id number] DOB: 1929-09-02 Height: 6' (182.9 cm) Weight: 104 kg (229 lb 4.5 oz)                                                                                                                                                  Insurance Information HMO:     PPO: yes     PCP:      IPA:      80/20:      OTHER: Medicare advantage plan PRIMARY: Health Team Advantage      Policy#: [id number]      Subscriber: pt CM Name: Crystal      Phone#: [phone]     Fax#: Epic access Pre-Cert#: [id number] for 7 days       Employer:  Benefits:  Phone #: [phone]     Name: 10/05/2017 Eff. Date: 10/13/2014     Deduct: none      Out of Pocket Max: $3400      Life Max: none CIR: $250 co pay per day days 1 through 6       SNF: no co pay days 1-20; $150 co pay days 21-100 Outpatient: $15 co pay per visit     Co-Pay: visits per medical neccesity Home Health: $25 copy per visit      Co-Pay: visits to be authorized DME: 80%     Co-Pay: 20% Providers: in network  SECONDARY: none        Medicaid Application Date:       Case Manager:  Disability Application Date:       Case Worker:   Emergency Tax adviser Information    Name Relation Home Work Mobile   [place] Spouse [place] [phone]       Current Medical History  Patient Admitting Diagnosis: left CVA  History of Present Illness:  [name] a 83 y.o.right handed malewith history of diabetes mellituswith peripheral  neuropathy, hypothyroidism, atrial fibrillation maintained on aspirin and multiple falls. Presented 10/02/2017 aphasia. Cranial CT scan negative. Patient did receive TPA. CT angiogram head and neck with no flow reducing intracranial stenosis. 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 Echocardiogram with ejection fraction of 65%. Systolic function was normal. EEG negative for seizure. Currently maintained on aspirin 81 mg daily  for CVA prophylaxisand Eliquisadded 10/05/2017.. Regular consistency diet.  Hi fall risk noted by Neuro, rec for RW while on Eliquis  Total: 3 NIHSS  Past Medical History      Past Medical History:  Diagnosis Date  . Allergic rhinitis   . Asthmatic bronchitis   . Atrial fibrillation ([place])   . Celiac disease   . Cellulitis   . Diverticulosis   . DM (diabetes mellitus) ([place])   . Fx. left wrist 1987  . Gastric polyps   . GERD (gastroesophageal reflux disease)   . History of pneumonia    w/pleural effusion  . Hypothyroidism   . Iron deficiency anemia   . Melanoma ([place])   . Neuropathy   . Prostate cancer ([place])   . REM sleep behavior disorder   . RLS (restless legs syndrome)   . Sleep apnea     Family History  family history includes Breast cancer in his mother; Colon cancer in his unknown relative; Heart disease in his father; Kidney failure in his father; Rheumatic fever in his father.  Prior Rehab/Hospitalizations:  Has the patient had major surgery during 100 days prior to admission? No  Current Medications   Current Facility-Administered Medications:  .  sodium chloride 0.9 % bolus 500 mL, 500 mL, Intravenous, Once **FOLLOWED BY** 0.9 %  sodium chloride infusion, 100 mL/hr, Intravenous, Continuous, Carmin Muskrat, MD, Last Rate: 100 mL/hr at 10/02/17 2127, 100 mL/hr at 10/02/17 2127 .  acetaminophen (TYLENOL) tablet 650 mg, 650 mg, Oral, Q6H PRN, Aroor, Lanice Schwab, MD, 650 mg at 10/04/17 0345 .  apixaban (ELIQUIS)  tablet 5 mg, 5 mg, Oral, BID, Kris Mouton, RPH, 5 mg at 10/07/17 9924 .  atorvastatin (LIPITOR) tablet 40 mg, 40 mg, Oral, q1800, Costello, Mary A, NP, 40 mg at 10/06/17 1749 .  doxycycline (VIBRA-TABS) tablet 100 mg, 100 mg, Oral, Q12H, Rinehuls, David L, PA-C, 100 mg at 10/07/17 2683 .  furosemide (LASIX) tablet 20 mg, 20 mg, Oral, Daily, Costello, Mary A, NP, 20 mg at 10/07/17 0829 .  insulin aspart (novoLOG) injection 0-9 Units, 0-9 Units, Subcutaneous, TID WC, Aroor, Lanice Schwab, MD, 1 Units at 10/06/17 1700 .  levothyroxine (SYNTHROID, LEVOTHROID) tablet 150 mcg, 150 mcg, Oral, QAC breakfast, Garvin Fila, MD, 150 mcg at 10/07/17 0830 .  metoprolol tartrate (LOPRESSOR) tablet 75 mg, 75 mg, Oral, BID, Garvin Fila, MD, 75 mg at 10/07/17 0830 .  pantoprazole (PROTONIX) EC tablet 40 mg, 40 mg, Oral, QHS, Rumbarger, Valeda Malm, RPH, 40 mg at 10/06/17 2136  Patients Current Diet: Diet gluten free Room service appropriate? Yes; Fluid consistency: Thin  Precautions / Restrictions Precautions Precautions: Fall Precaution Comments: Watch HR Restrictions Weight Bearing Restrictions: No   Has the patient had 2 or more falls or a fall with injury in the past year?Yes Wife reports balance issues at baseline. Uses RW at night when up to bathroom. Uses Cane in the community per wife.  Prior Activity Level Works as a Teacher, music. 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 Walker and cane  Prior Functional Level Prior Function Level of Independence: (worked as a Teacher, music; fulltime)  Self Care: Did the patient need help bathing, dressing, using the toilet or eating?  Independent  Indoor Mobility: Did the patient need assistance with walking from room  to room (with or without  device)? Independent  Stairs: Did the patient need assistance with internal or external stairs (with or without device)? Independent  Functional Cognition: Did the patient need help planning regular tasks such as shopping or remembering to take medications? Needed some help  Current Functional Level Cognition  Overall Cognitive Status: Impaired/Different from baseline Difficult to assess due to: Impaired communication Orientation Level: Oriented to person, Oriented to place, Disoriented to time Following Commands: Follows one step commands with increased time, Follows multi-step commands inconsistently Safety/Judgement: Decreased awareness of deficits General Comments: Some word finding difficulties noted.  Attention: Focused Focused Attention: Appears intact    Extremity Assessment (includes Sensation/Coordination)  Upper Extremity Assessment: RUE deficits/detail, Generalized weakness(bilateral tremor, poor coordination) RUE Deficits / Details: Proprioceptive deficits and overshooting notably greater than L this session.   Lower Extremity Assessment: Defer to PT evaluation    ADLs  Overall ADL's : Needs assistance/impaired Eating/Feeding: Supervision/ safety, Sitting Grooming: Minimal assistance, Standing, Wash/dry face, Oral care Grooming Details (indicate cue type and reason): Cues that comb was his Upper Body Bathing: Minimal assistance, Sitting Lower Body Bathing: Maximal assistance, Sit to/from stand Upper Body Dressing : Minimal assistance, Sitting Lower Body Dressing: Maximal assistance, Sit to/from stand Toilet Transfer: Moderate assistance, +2 for physical assistance, Ambulation, RW Toilet Transfer Details (indicate cue type and reason): Simulated by sit to stand from EOB with functional mobility. Mod assist +2 for sit to stand, min assist for functional mobility. Toileting- Clothing Manipulation and Hygiene: Sit to/from stand, Moderate assistance Functional  mobility during ADLs: Minimal assistance, +2 for physical assistance, +2 for safety/equipment, Rolling walker General ADL Comments: Pt limited by B UE tremor, decreased R UE proprioception and impaired communication. Greatest difficulty in expressive communication but noted receptive deficits at times.     Mobility  Overal bed mobility: Needs Assistance Bed Mobility: Supine to Sit Supine to sit: Min guard General bed mobility comments: HOB slightly elevated with use of bed rails. Increased time and effort.    Transfers  Overall transfer level: Needs assistance Equipment used: Rolling walker (2 wheeled) Transfers: Sit to/from Stand Sit to Stand: Mod assist, +2 physical assistance General transfer comment: for sit to stand from EOB, min assist +2 for sit to stand from chair. Cues for hand placement and technique.    Ambulation / Gait / Stairs / Wheelchair Mobility  Ambulation/Gait Ambulation/Gait assistance: Min assist, +2 safety/equipment Ambulation Distance (Feet): 60 Feet Assistive device: Rolling walker (2 wheeled) Gait Pattern/deviations: Step-to pattern, Decreased stride length, Trunk flexed, Narrow base of support, Step-through pattern, Decreased stance time - right, Decreased dorsiflexion - right General Gait Details: Cues for step through gait and upright. Pt looks down due to decreased sensation in BLEs and into feet.  Gait velocity: slow Gait velocity interpretation: Below normal speed for age/gender    Posture / Balance Balance Overall balance assessment: Needs assistance Sitting-balance support: Feet supported, No upper extremity supported Sitting balance-Leahy Scale: Good Standing balance support: Single extremity supported, During functional activity Standing balance-Leahy Scale: Poor Standing balance comment: Min assist for grooming task at sink. Able to brush teeth with UE support.     Special needs/care consideration BiPAP/CPAP  N/a CPM  N/a Continuous Drip  IV  N/a Dialysis  N/a Life Vest  N/a Oxygen  N/a Special Bed high low bed recommended due to high risk falls pta Trach Size  N/a Wound /a Skin Wounds left leg and neck  Bowel mgmt: Incontinence with last BM 10/06/17 Bladder mgmt: Incontinence with external catheter in place Diabetic mgmt Hgb A1 c 7.8   Previous Home Environment Living Arrangements: Spouse/significant other  Lives With: Spouse Available Help at Discharge: Family, Available 24 hours/day Type of Home: House Home Layout: One level Home Access: Stairs to enter Entrance Stairs-Rails: Right, Left, Can reach both Entrance Stairs-Number of Steps: 3 Bathroom Shower/Tub: Tub/shower unit, Industrial/product designer: (very small and he holds onto sink to stabilize walking) Home Care Services: No Additional Comments: confirmed with wife  Discharge Living Setting Plans for Discharge Living Setting: Patient's home, Lives with (comment)(wife) Type of Home at Discharge: House Discharge Home Layout: One level Discharge Home Access: Stairs to enter Entrance Stairs-Rails: Right, Left Entrance Stairs-Number of Steps: 3 Discharge Bathroom Shower/Tub: Tub/shower unit, Curtain Discharge Bathroom Toilet: Standard Discharge Bathroom Accessibility: No(very small bathrooms; does have some grab bars) Does the patient have any problems obtaining your medications?: No  Social/Family/Support Systems Patient Roles: Spouse, 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 Information: [name], wife Anticipated Caregiver: wife Anticipated Ambulance person Information: see above Ability/Limitations of Caregiver: wife small and petitie in stature; patient tall Caregiver Availability: 24/7 Discharge Plan Discussed with Primary Caregiver: Yes Is Caregiver In Agreement with Plan?: Yes Does Caregiver/Family have Issues with Lodging/Transportation while Pt is in Rehab?:  No  Goals/Additional Needs Patient/Family Goal for Rehab: Mod I to supervision with PT, OT, and SLP Expected length of stay: ELOS 10- 14 days Cultural Considerations: 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 [place] Trails to Admission and willing to participate: Yes Program Orientation Provided & Reviewed with Pt/Caregiver Including Roles  & Responsibilities: Yes  Decrease burden of Care through IP rehab admission: n/a  Possible need for SNF placement upon discharge:possible if pt does not reach supervision level  Patient Condition: This patient's medical and functional status has changed since the consult dated: 10/05/17 in which the Rehabilitation Physician determined and documented that the patient's condition is appropriate for intensive rehabilitative care in an inpatient rehabilitation facility. See "History of Present Illness" (above) for medical update. Functional changes are:  Currently requiring mod assist for transfers and min assist to ambulate 60 feet RW. Patient's medical and functional status update has been discussed with the Rehabilitation physician and patient remains appropriate for inpatient rehabilitation. Will admit to inpatient rehab today.  Preadmission Screen Completed By:  [name], 10/07/2017 11:13 AM ______________________________________________________________________   Discussed status with Dr. Posey Pronto on 10/07/17 at 1119 and received telephone approval for admission today.  Admission Coordinator:  [name], time 1119/Date 10/07/17             Cosigned by: Jamse Arn, MD at 10/07/2017 12:01 PM  Revision History

## 2017-10-08 NOTE — Progress Notes (Signed)
Subjective/Complaints:  Pt has had liquid stools but no BM since admit to rehab, intermittent abd pain, no N/V  ROS- some limitation from aphasia, see above  Objective: Vital Signs: Blood pressure (!) 135/51, pulse 66, temperature 98.6 F (37 C), temperature source Oral, resp. rate 18, height 5\' 10"  (1.778 m), weight 100.7 kg (222 lb 0.1 oz), SpO2 97 %. No results found. Results for orders placed or performed during the hospital encounter of 10/07/17 (from the past 72 hour(s))  Glucose, capillary     Status: Abnormal   Collection Time: 10/07/17  9:04 PM  Result Value Ref Range   Glucose-Capillary 280 (H) 65 - 99 mg/dL  Glucose, capillary     Status: Abnormal   Collection Time: 10/08/17  6:24 AM  Result Value Ref Range   Glucose-Capillary 133 (H) 65 - 99 mg/dL       Physical Exam  Vitals reviewed. HENT:  Head: Normocephalic.  Eyes: EOM are normal.  Neck: Normal range of motion. Neck supple. No thyromegaly present.  Cardiovascular:  Cardiac rate controlled  Respiratory: Effort normal and breath sounds normal. No respiratory distress.  GI: Soft. Bowel sounds are normal. He exhibits no distension.  Neurological: He is alert.  Makes good eye contact with examiner. Patient with some subtle expressive receptive aphasia. He was able to state his name and age as well as follow simple commands.  Motor:  5/5 in L delt , Bi , Tri, Grip, HF, KE , ADF, 5- on left side Sensation: reduced LT below the tibial tubercle bilateral Difficult with naming spontaneously as well as visual objects such as ring, able to repeat Comprehension appears intact   Assessment/Plan: 1. Functional deficits secondary to Left MCA infarct with gait disorder , aphasia which require 3+ hours per day of interdisciplinary therapy in a comprehensive inpatient rehab setting. Physiatrist is providing close team supervision and 24 hour management of active medical problems listed below. Physiatrist and rehab team  continue to assess barriers to discharge/monitor patient progress toward functional and medical goals. FIM:                   Function - Comprehension Comprehension assist level: Understands complex 90% of the time/cues 10% of the time  Function - Expression Expression assist level: Expresses basic needs/ideas: With no assist  Function - Social Interaction Social Interaction assist level: Interacts appropriately with others - No medications needed.  Function - Problem Solving Problem solving assist level: Solves basic problems with no assist  Function - Memory Patient normally able to recall (first 3 days only): Current season, Location of own room, Staff names and faces, That he or she is in a hospital  Medical Problem List and Plan: 1.  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 CIR evals today 2.  DVT Prophylaxis/Anticoagulation: Eliquis 3. Pain Management: Tylenol as needed  4. Mood: Provide emotional support  5. Neuropsych: This patient is  capable of making decisions on his  own behalf. 6. Skin/Wound Care: Routine skin checks  7. Fluids/Electrolytes/Nutrition: Routine I&O's with follow-up chemistries 8. Atrial fibrillation. Cardiac rate controlled. Continue Eliquis 9. Diabetes mellitus peripheral neuropathy. Hemoglobin A1c 7.8. SSI. Patient on Glucotrol 2.5 mg daily prior to admission. Resume when intake improves CBG (last 3)  Recent Labs    10/07/17 1100 10/07/17 2104 10/08/17 0624  GLUCAP 176* 280* 133*   10. Hypertension. Permissive hypertension and monitor Lopressor 75 mg twice a day, Lasix 20 mg  daily Vitals:   10/07/17 1700 10/08/17 0319  BP: (!) 152/55 (!) 135/51  Pulse: 69 66  Resp: 17 18  Temp: 98.4 F (36.9 C) 98.6 F (37 C)  SpO2: 99% 97%  adequate control 11. Hypothyroidism. TSH 4.307. Continue Synthroid 12. Hyperlipidemia. Lipitor 13. Obesity             Body mass index is 31.1  kg/m.             Diet and exercise education             Encourage weight loss to increase endurance and promote overall health   LOS (Days) 1 A FACE TO FACE EVALUATION WAS PERFORMED  [name] 10/08/2017, 8:21 AM

## 2017-10-08 NOTE — Evaluation (Signed)
Speech Language Pathology Assessment and Plan  Patient Details  Name: [name] MRN: [id number] Date of Birth: 25-May-1929  SLP Diagnosis: Aphasia  Rehab Potential: Excellent ELOS: 18-21 days    Today's Date: 10/08/2017 SLP Individual Time: 1030-1130 SLP Individual Time Calculation (min): 60 min   Problem List:  Patient Active Problem List   Diagnosis Date Noted  . 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 Aphasia   . PAF (paroxysmal atrial fibrillation) ([place])   . Diabetes mellitus type 2 in obese ([place])   . Benign essential HTN   . Hypothyroidism   . Hyperlipidemia   . Stroke (cerebrum) ([place]) 10/02/2017  . Afib ([place]) 01/10/2017  . SOB (shortness of breath) 01/10/2017  . Acute diastolic CHF (congestive heart failure) ([place]) 01/10/2017  . Atrial fibrillation with RVR ([place]) 01/10/2017  . Claudication ([place]) 07/25/2016  . Paroxysmal atrial fibrillation ([place]) 07/25/2016  . Odynophagia 04/10/2014  . Allergic rhinitis 03/23/2014  . Chronic rhinitis 12/22/2013  . 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 Constipation 08/22/2011  . Obesity 09/20/2009  . G E R D 11/29/2007  . Celiac disease 11/29/2007   Past Medical History:  Past Medical History:  Diagnosis Date  . Allergic rhinitis   . Asthmatic bronchitis   . Atrial fibrillation ([place])   . Celiac disease   . Cellulitis   . Diverticulosis   . DM (diabetes mellitus) ([place])   . Fx. left wrist 1987  . Gastric polyps   . GERD (gastroesophageal reflux disease)   . History of pneumonia    w/pleural effusion  . Hypothyroidism   . Iron deficiency anemia   . Melanoma ([place])   . Neuropathy   . Prostate cancer ([place])   . REM sleep behavior disorder   . RLS (restless legs syndrome)   . Sleep apnea    Past Surgical History:  Past Surgical History:  Procedure Laterality Date  . APPENDECTOMY    . CARPAL TUNNEL RELEASE    . CATARACT EXTRACTION  03/2011   bilateral   . CHOLECYSTECTOMY  2001  . COLONOSCOPY W/ BIOPSIES   04/27/2008   diverticulosis, duodenitis  . FOOT SURGERY    . HERNIA REPAIR  1994   bilateral  . KNEE SURGERY     x 2  . LUNG SURGERY  2001  . PROSTATECTOMY  2002  . UPPER GASTROINTESTINAL ENDOSCOPY  04/27/2008   celiac disease, gastric polyps    Assessment / Plan / Recommendation Clinical Impression Patient is a 83 y.o. right handed male with history of diabetes mellitus with peripheral neuropathy, hypothyroidism, atrial fibrillation maintained on aspirin and multiple falls. Presented 10/02/2017 with aphasia. Per chart review patient lives with spouse independent prior to admission and still driving. Wife can assist. Cranial CT scan reviewed, unremarkable for acute process.  Patient did receive TPA. CT angiogram head and neck with no flow reducing intracranial stenosis. 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 Echocardiogram with ejection fraction of 65%. Systolic function was normal. EEG negative for seizure. Carotid Dopplers in no ICA stenosis. Currently maintained on Eliquis for CVA prophylaxis. Regular consistency diet. Physical and occupational therapy evaluations completed 10/04/2017 with recommendations of physical medicine rehabilitation consult. Patient was admitted for a comprehensive rehabilitation program.  Patient presents with a moderate aphasia impacting all four modalities of language. Patient often communicated at the phrase and sentence level with phonemic paraphasias present. Phonemic cues were not effective in cueing and patient often utilized spelling words aloud to aid in word-finding/assistance from communication  partner.  Patient demonstrated impaired repetition at the word level with intermittent awareness of errors. Patient's auditory comprehension appeared intact with basic information. However, breakdown occurred with 2-step commands and complex yes/no questions. Patient's reading comprehension also appeared intact but patient demonstrating difficulty with oral reading  when reading aloud. Cognitive function appeared [facility] for all tasks assessed. Patient would benefit from skilled SLP intervention to maximize his functional communication and overall functional independence prior to discharge home.    Skilled Therapeutic Interventions          Administered a cognitive-linguistic evaluation. Please see above for details. Educated patient in regards to his current language impairments and goals of skilled SLP intervention. He verbalized understanding.   SLP Assessment  Patient will need skilled [facility] during CIR admission    Recommendations  Oral Care Recommendations: Oral care BID Patient destination: Home Follow up Recommendations: 24 hour supervision/assistance;Outpatient SLP Equipment Recommended: None recommended by SLP    SLP Frequency 3 to 5 out of 7 days   SLP Duration  SLP Intensity  SLP Treatment/Interventions 18-21 days  Minumum of 1-2 x/day, 30 to 90 minutes  Cueing hierarchy;Environmental controls;Patient/family education;Therapeutic Activities;Functional tasks;Multimodal communication approach    Pain Pain Assessment Pain Assessment: No/denies pain  Prior Functioning Type of Home: House  Lives With: Spouse Available Help at Discharge: Available 24 hours/day 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 Needs to repeat parts of sentences.  Social Interaction Social Interaction assist level: Interacts appropriately with others with medication or extra time (anti-anxiety, antidepressant).  Problem Solving Problem solving assist level: Solves basic 90% of the time/requires cueing < 10% of the time  Memory Memory assist level: More  than reasonable amount of time   Short Term Goals: Week 1: SLP Short Term Goal 1 (Week 1): Patient will self-monitor and correct verbal errors at the phrase level with Min A multimodal cues.  SLP Short Term Goal 2 (Week 1): Patient will perform oral reading tasks with 75% accuracy and Min A verbal cues.  SLP Short Term Goal 3 (Week 1): Patient will follow 2-step commnands with 25% accuracy and Mod A verbal cues.  SLP Short Term Goal 4 (Week 1): Patient will utilize word-finding strategies at the phrase level with Min A verbal cues.   Refer to Care Plan for Long Term Goals  Recommendations for other services: None   Discharge Criteria: Patient will be discharged from SLP if patient refuses treatment 3 consecutive times without medical reason, if treatment goals not met, if there is a change in medical status, if patient makes no progress towards goals or if patient is discharged from hospital.  The above assessment, treatment plan, treatment alternatives and goals were discussed and mutually agreed upon: by patient  Quinteria Chisum 10/08/2017, 3:45 PM

## 2017-10-08 NOTE — Plan of Care (Signed)
Long term goals established 12/27

## 2017-10-08 NOTE — Evaluation (Signed)
Physical Therapy Assessment and Plan  Patient Details  Name: [name] MRN: [id number] Date of Birth: Feb 15, 1929  PT Diagnosis: Abnormality of gait, Cognitive deficits, Coordination disorder, Impaired cognition, Impaired sensation and Muscle weakness Rehab Potential: Good ELOS: 18 to 21 days   Today's Date: 10/08/2017 PT Individual Time: 3790-2409 PT Individual Time Calculation (min): 65 min    Problem List:  Patient Active Problem List   Diagnosis Date Noted  . 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 Aphasia   . PAF (paroxysmal atrial fibrillation) ([place])   . Diabetes mellitus type 2 in obese ([place])   . Benign essential HTN   . Hypothyroidism   . Hyperlipidemia   . Stroke (cerebrum) ([place]) 10/02/2017  . Afib ([place]) 01/10/2017  . SOB (shortness of breath) 01/10/2017  . Acute diastolic CHF (congestive heart failure) ([place]) 01/10/2017  . Atrial fibrillation with RVR ([place]) 01/10/2017  . Claudication ([place]) 07/25/2016  . Paroxysmal atrial fibrillation ([place]) 07/25/2016  . Odynophagia 04/10/2014  . Allergic rhinitis 03/23/2014  . Chronic rhinitis 12/22/2013  . 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 Constipation 08/22/2011  . Obesity 09/20/2009  . G E R D 11/29/2007  . Celiac disease 11/29/2007    Past Medical History:  Past Medical History:  Diagnosis Date  . Allergic rhinitis   . Asthmatic bronchitis   . Atrial fibrillation ([place])   . Celiac disease   . Cellulitis   . Diverticulosis   . DM (diabetes mellitus) ([place])   . Fx. left wrist 1987  . Gastric polyps   . GERD (gastroesophageal reflux disease)   . History of pneumonia    w/pleural effusion  . Hypothyroidism   . Iron deficiency anemia   . Melanoma ([place])   . Neuropathy   . Prostate cancer ([place])   . REM sleep behavior disorder   . RLS (restless legs syndrome)   . Sleep apnea    Past Surgical History:  Past Surgical History:  Procedure Laterality Date  . APPENDECTOMY    . CARPAL TUNNEL RELEASE     . CATARACT EXTRACTION  03/2011   bilateral   . CHOLECYSTECTOMY  2001  . COLONOSCOPY W/ BIOPSIES  04/27/2008   diverticulosis, duodenitis  . FOOT SURGERY    . HERNIA REPAIR  1994   bilateral  . KNEE SURGERY     x 2  . LUNG SURGERY  2001  . PROSTATECTOMY  2002  . UPPER GASTROINTESTINAL ENDOSCOPY  04/27/2008   celiac disease, gastric polyps    Assessment & Plan Clinical Impression: Patient is a 83 y.o. year old male with recent admission to the hospital on Oct 02, 2017 with history of diabetes mellituswith peripheral neuropathy, hypothyroidism, atrial fibrillation maintained on aspirin and multiple falls. Presented 10/02/2017 with aphasia. Per chart review patient lives with spouse independent prior to [place] still driving.Wife can assist.Cranial CT scan reviewed, unremarkable for acute process.  Patient did receive TPA. CT angiogram head and neck with no flow reducing intracranial stenosis. 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 Echocardiogram with ejection fraction of 65%. Systolic function was normal. EEG negative for seizure. Carotid Dopplers in no ICA stenosis. Currently maintained on Eliquis for CVA prophylaxis. Regular consistency diet. Physical and occupational therapy evaluations completed 10/04/2017 with recommendations of physical medicine rehabilitation consult. Patient was admitted for a comprehensive rehabilitation program.   .  Patient transferred to CIR on 10/07/2017 .   Patient currently requires mod with mobility secondary to muscle weakness, decreased cardiorespiratoy endurance, decreased coordination,  decreased memory and decreased sitting balance, decreased standing balance, decreased postural control and decreased balance strategies.  Prior to hospitalization, patient was modified independent  with mobility and lived with Spouse in a House home.  Home access is (4)Stairs to enter.  Patient will benefit from skilled PT intervention to maximize safe functional  mobility, minimize fall risk and decrease caregiver burden for planned discharge home with 24 hour assist.  Anticipate patient will benefit from follow up [facility] at discharge.  PT - End of Session Activity Tolerance: Tolerates 10 - 20 min activity with multiple rests Endurance Deficit: Yes Endurance Deficit Description: Requires rest due to fatigue PT Assessment Rehab Potential (ACUTE/IP ONLY): Good PT Barriers to Discharge: Decreased caregiver support;Other (comments)(Cognition(memory)) PT Patient demonstrates impairments in the following area(s): Balance;Edema;Motor;Safety;Sensory PT Transfers Functional Problem(s): Bed Mobility;Bed to Chair;Car;Furniture PT Locomotion Functional Problem(s): Ambulation;Wheelchair Mobility;Stairs PT Plan PT Intensity: Minimum of 1-2 x/day ,45 to 90 minutes PT Frequency: 5 out of 7 days PT Duration Estimated Length of Stay: 18 to 21 days PT Treatment/Interventions: Ambulation/gait training;Cognitive remediation/compensation;Balance/vestibular training;Community reintegration;Disease management/prevention;Neuromuscular re-education;Patient/family education;Stair training;Therapeutic Exercise;UE/LE Coordination activities;Wheelchair propulsion/positioning;UE/LE Strength taining/ROM;Therapeutic Activities;Psychosocial support;Pain management;Functional mobility training;DME/adaptive equipment instruction;Discharge planning PT Transfers Anticipated Outcome(s): Supervision to min A PT Locomotion Anticipated Outcome(s): Supervision to min A PT Recommendation Recommendations for Other Services: Speech consult Follow Up Recommendations: Home health PT Patient destination: (Depends on wifes ability to provide care) Equipment Recommended: To be determined  Skilled Therapeutic Intervention Session focused on improving mobility to decrease burden of care for hopeful D/C to home envrionment.  Session initiated with pt lying in bed.  Vitals: 149/82:  HR: 75 bpm.  Due to pt  inability to immediately recall three items listed to patient, session focused on utilizing errorless learning for sit to standing from w/c to walker.  Pt does well t/o session problem solving activities familiar to him, however, when new activities performed, pt requires frequent verbal and manual cues.  Pt additionally worked on performing stairs, care transfer, and ambulation to aid in decreased burden of care for hopeful D/C to home environment.  PT Evaluation Precautions/Restrictions Precautions Precautions: Fall Precaution Comments: Absent sensation in B LE distal to knee; frequent faller prior to admission Restrictions Weight Bearing Restrictions: No General Chart Reviewed: Yes Additional Pertinent History: H/o b/l peripheral neuropathy prior to admission Response to Previous Treatment: Patient with no complaints from previous session. Vital SignsTherapy Vitals Pulse Rate: 75 BP: (!) 149/82   Home Living/Prior Functioning Home Living Available Help at Discharge: Available 24 hours/day Type of Home: House Home Access: Stairs to enter [employer] of Steps: (4) Entrance Stairs-Rails: (One side only) Home Layout: One level  Lives With: Spouse Prior Function Level of Independence: Other (comment)(Used walker at night; No assistive device during the day but had frequent falls.)  Able to Take Stairs?: Yes Vision/Perception  No change in baseline.  Glasses prior      Cognition Overall Cognitive Status: Impaired/Different from baseline Orientation Level: Oriented to situation;Oriented to person;Oriented to place Focused Attention: Appears intact Memory: Impaired Memory Impairment: Decreased recall of new information Awareness: Appears intact Problem Solving: Appears intact Safety/Judgment: Appears intact Sensation Sensation Light Touch: Impaired Detail Light Touch Impaired Details: (Absent in B LE distal to knees (prior h/o peripheral  neuropathy)) Coordination Gross Motor Movements are Fluid and Coordinated: No Fine Motor Movements are Fluid and Coordinated: No Finger Nose Finger Test: Dysmetria noted with finger to nose b/l and B UE/LE RAMPS impaired with decreased accuracy    Trunk/Postural  Assessment  Cervical Assessment Cervical Assessment: Exceptions to WFL(Increased lordosis) Thoracic Assessment Thoracic Assessment: Exceptions to WFL(Increased thoracic kyphosis) Lumbar Assessment Lumbar Assessment: Exceptions to WFL(Increased posterior pelvic tilt) Postural Control Postural Control: Deficits on evaluation(Posterior lean in sitting and requires v/c for leaning anterior to prevent LOB)  Balance Balance Balance Assessed: Yes Static Sitting Balance Static Sitting - Balance Support: Feet unsupported(Requires min A sitting EOB) Dynamic Sitting Balance Dynamic Sitting - Balance Support: During functional activity(Requires min A - unable to reach down to put pants on due to concern for LOB) Static Standing Balance Static Standing - Balance Support: Bilateral upper extremity supported Dynamic Standing Balance Dynamic Standing - Balance Support: Bilateral upper extremity supported;During functional activity(During toileting - unable to let go of walker to pull pants back up) Extremity Assessment  RUE Assessment RUE Assessment: (AROM WFL; strength not formally tested) LUE Assessment LUE Assessment: (AROM WFL; Strength not formally tested) RLE Assessment RLE Assessment: (1/5 in ankle DF; 5/5 in quadriceps; able to use B/L LE functionally for scooting upward in bed) LLE Assessment LLE Assessment: (2/5 in DF; B/L knee flexion funcitonal for scooting up in bed; b/l knee extension: 5/5)   See Function Navigator for Current Functional Status.   Refer to Care Plan for Long Term Goals  Recommendations for other services: None   Discharge Criteria: Patient will be discharged from PT if patient refuses treatment 3  consecutive times without medical reason, if treatment goals not met, if there is a change in medical status, if patient makes no progress towards goals or if patient is discharged from hospital.  The above assessment, treatment plan, treatment alternatives and goals were discussed and mutually agreed upon: by patient  Shaft Corigliano Hilario Quarry 10/08/2017, 12:38 PM

## 2017-10-08 NOTE — Progress Notes (Signed)
[name], MD  Physician  Physical Medicine and Rehabilitation  Consult Note  Addendum  Date of Service:  10/05/2017 5:44 AM       Related encounter: ED to Hosp-Admission (Discharged) from 10/02/2017 in [facility]      Expand All Collapse All       [] Hide copied text  [] Hover for details        Physical Medicine and Rehabilitation Consult Reason for Consult: Decreased functional mobility Referring Physician: Dr. Leonie Man   HPI: [name] is a 83 y.o. right handed male with history of diabetes mellitus with peripheral neuropathy, hypothyroidism, atrial fibrillation maintained on aspirin and multiple falls. Presented 10/02/2017 aphasia. Per chart review patient lives with spouse independent prior to admission and still driving. Wife can assist. Cranial CT scan negative. Patient did receive TPA. CT angiogram head and neck with no flow reducing intracranial stenosis. 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 Echocardiogram with ejection fraction of 65%. Systolic function was normal. EEG negative for seizure. Currently maintained on aspirin 81 mg daily for CVA prophylaxis and  Eliquis added 10/05/2017..  Regular consistency diet. Physical therapy evaluation completed 10/04/2017 with recommendations of physical medicine rehabilitation consult.  Hi fall risk noted by Neuro, rec for RW while on Eliquis Review of Systems  Constitutional: Negative for chills and fever.  HENT: Negative for hearing loss.   Eyes: Negative for blurred vision and double vision.  Respiratory: Negative for cough and shortness of breath.   Cardiovascular: Positive for palpitations. Negative for chest pain and leg swelling.  Gastrointestinal: Positive for constipation. Negative for nausea and vomiting.       GERD  Genitourinary: Positive for urgency. Negative for dysuria, flank pain and hematuria.  Musculoskeletal: Positive for myalgias.  Skin: Negative for  rash.  Neurological: Positive for speech change and focal weakness.  All other systems reviewed and are negative.      Past Medical History:  Diagnosis Date  . Allergic rhinitis   . Asthmatic bronchitis   . Atrial fibrillation ([place])   . Celiac disease   . Cellulitis   . Diverticulosis   . DM (diabetes mellitus) ([place])   . Fx. left wrist 1987  . Gastric polyps   . GERD (gastroesophageal reflux disease)   . History of pneumonia    w/pleural effusion  . Hypothyroidism   . Iron deficiency anemia   . Melanoma ([place])   . Neuropathy   . Prostate cancer ([place])   . REM sleep behavior disorder   . RLS (restless legs syndrome)   . Sleep apnea         Past Surgical History:  Procedure Laterality Date  . APPENDECTOMY    . CARPAL TUNNEL RELEASE    . CATARACT EXTRACTION  03/2011   bilateral   . CHOLECYSTECTOMY  2001  . COLONOSCOPY W/ BIOPSIES  04/27/2008   diverticulosis, duodenitis  . FOOT SURGERY    . HERNIA REPAIR  1994   bilateral  . KNEE SURGERY     x 2  . LUNG SURGERY  2001  . PROSTATECTOMY  2002  . UPPER GASTROINTESTINAL ENDOSCOPY  04/27/2008   celiac disease, gastric polyps        Family History  Problem Relation Age of Onset  . Breast cancer Mother   . Kidney failure Father   . Heart disease Father   . Rheumatic fever Father   . Colon cancer Unknown    Social History:  reports that  has never smoked. he has never used smokeless tobacco. He reports that he does not drink alcohol or use drugs. Allergies:       Allergies  Allergen Reactions  . Clindamycin Diarrhea  . Gluten Meal Diarrhea    No rye; no barley = HAS CELIAC DISEASE  . Hydrocodone Other (See Comments)    Caused mental status changes and suffered withdrawals when he stopped it  . Penicillins Other (See Comments)    From childhood: Has patient had a PCN reaction causing immediate rash, facial/tongue/throat swelling, SOB or lightheadedness with  hypotension: Unk Has patient had a PCN reaction causing severe rash involving mucus membranes or skin necrosis: Unk Has patient had a PCN reaction that required hospitalization: Unk Has patient had a PCN reaction occurring within the last 10 years: No If all of the above answers are "NO", then may proceed with Cephalosporin use.   . Procaine Other (See Comments)    "Passes out"  . Sulfonamide Derivatives Hives  . Tape Other (See Comments)    PLEASE USE AN ALTERNATIVE; LIKE COBAN WRAP; TAPE TEARS THE SKIN AND CAUSES BLISTERS!!  . Wheat Bran Diarrhea    Pt family states pt has celiac disease  . Cephalexin Diarrhea and Nausea And Vomiting    Dry hives, "violent" diarhea  . Mold Extract [Trichophyton] Other (See Comments)    Stuffiness, post-nasal drip         Medications Prior to Admission  Medication Sig Dispense Refill  . Alpha-Lipoic Acid 300 MG CAPS Take 1 capsule by mouth daily.      Marland Kitchen amitriptyline (ELAVIL) 75 MG tablet Take 75 mg by mouth at bedtime.     . Ascorbic Acid (VITAMIN C) 1000 MG tablet Take 1,000 mg by mouth 2 (two) times daily.      Marland Kitchen aspirin 81 MG tablet Take 81 mg by mouth daily.     Marland Kitchen azelastine (ASTELIN) 0.1 % nasal spray Place 1 spray into both nostrils at bedtime.     . Cholecalciferol (VITAMIN D3) 1000 UNITS CAPS Take 3,000 Units by mouth 3 (three) times daily.     . Coenzyme Q10 (COQ10) 200 MG CAPS Take 200 mg by mouth daily.     Marland Kitchen doxycycline (VIBRA-TABS) 100 MG tablet Take 100 mg by mouth 2 (two) times daily.    . fluticasone (FLONASE) 50 MCG/ACT nasal spray Place 1 spray into both nostrils daily.     . furosemide (LASIX) 20 MG tablet Take 1 tablet (20 mg total) by mouth daily. (Patient taking differently: Take 20-40 mg by mouth See admin instructions. 20 mg once a day and may take an additional 20 mg once a day for a weight gain of 3-5 pounds in 24 hours) 60 tablet 3  . glipiZIDE (GLUCOTROL XL) 2.5 MG 24 hr tablet Take 2.5 mg by  mouth daily with breakfast.    . GLUCOSAMINE-CHONDROITIN PO Take 1 tablet by mouth 3 (three) times daily.    Marland Kitchen levocetirizine (XYZAL) 5 MG tablet Take 5 mg by mouth at bedtime.     Marland Kitchen levothyroxine (SYNTHROID, LEVOTHROID) 150 MCG tablet Take 150 mcg by mouth daily before breakfast.    . magnesium gluconate (MAGONATE) 500 MG tablet Take 500 mg by mouth daily.      . Melatonin 3 MG TABS Take 3 mg by mouth at bedtime.     . metoprolol tartrate (LOPRESSOR) 25 MG tablet Take 1 tablet (25 mg total) by mouth 2 (two) times daily. Take 1 tab  along with 50 mg tab to equal 75 mg twice daily. (Patient taking differently: Take 75 mg by mouth 2 (two) times daily. ) 180 tablet 3  . Multiple Vitamin (MULTIVITAMIN) tablet Take 1 tablet by mouth daily.      . Omega-3 Fatty Acids (FISH OIL) 1000 MG CAPS Take 1 capsule by mouth daily.    . pantoprazole (PROTONIX) 40 MG tablet Take 40 mg by mouth daily.    . Red Yeast Rice Extract 600 MG TABS Take 600 mg by mouth daily.     Marland Kitchen senna (SENOKOT) 8.6 MG tablet Take 1 tablet by mouth daily as needed for constipation.     Marland Kitchen telmisartan (MICARDIS) 20 MG tablet Take 20 mg by mouth daily.      Home: Home Living Family/patient expects to be discharged to:: Private residence Living Arrangements: Spouse/significant other Available Help at Discharge: Family Type of Home: House Additional Comments: Will need to complete HOme Living section with family  Lives With: Spouse  Functional History: Prior Function Level of Independence: Independent Functional Status:  Mobility: Bed Mobility Overal bed mobility: Needs Assistance Bed Mobility: Supine to Sit Supine to sit: Mod assist General bed mobility comments: Verbal and tactile cues for techqniue; Mod assist and use of bed pad to scoot hips to EOB and square off Transfers Overall transfer level: Needs assistance Equipment used: Rolling walker (2 wheeled) Transfers: Sit to/from Stand Sit to Stand:  Min assist General transfer comment: Min assist to steady Ambulation/Gait Ambulation/Gait assistance: Min assist, +2 safety/equipment Ambulation Distance (Feet): (pivotal steps bed to chair) Assistive device: Rolling walker (2 wheeled) Gait Pattern/deviations: Shuffle General Gait Details: Limited gait distance due to tachycardia  ADL:  Cognition: Cognition Overall Cognitive Status: Difficult to assess Orientation Level: Oriented X4 Attention: Focused Focused Attention: Appears intact Cognition Arousal/Alertness: Awake/alert Behavior During Therapy: WFL for tasks assessed/performed Overall Cognitive Status: Difficult to assess Difficult to assess due to: Impaired communication  Blood pressure (!) 147/64, pulse 66, temperature 98.6 F (37 C), temperature source Oral, resp. rate 18, height 6' (1.829 m), weight 103 kg (227 lb 1.2 oz), SpO2 98 %. Physical Exam  Vitals reviewed. HENT:  Head: Normocephalic.  Eyes: EOM are normal.  Neck: Normal range of motion. Neck supple. No thyromegaly present.  Cardiovascular:  Cardiac rate controlled  Respiratory: Effort normal and breath sounds normal. No respiratory distress.  GI: Soft. Bowel sounds are normal. He exhibits no distension.  Neurological: He is alert.  Makes good eye contact with examiner. Patient with some subtle expressive receptive aphasia. He was able to state his name and age as well as follow simple commands.  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 Does the need for close, 24 hr/day medical supervision in concert with the patient's rehab needs make it unreasonable for this patient to be served in a less intensive  setting? Yes 2. Co-Morbidities requiring supervision/potential complications: Afib, DM with severe peripheral sensory neuropathy 3. Due to bladder management, bowel management, safety, skin/wound care, disease management, medication administration, pain management and patient education, does the patient require 24 hr/day rehab nursing? Yes 4. Does the patient require coordinated care of a physician, rehab nurse, PT (1-2  hrs/day, 5 days/week), OT (1-2 hrs/day, 5 days/week) and SLP (.5-1 hrs/day, 5 days/week) to address physical and functional deficits in the context of the above medical diagnosis(es)? Yes Addressing deficits in the following areas: balance, endurance, locomotion, strength, transferring, bowel/bladder control, bathing, dressing, feeding, grooming, toileting, cognition, speech, language, swallowing and psychosocial support 5. Can the patient actively participate in an intensive therapy program of at least 3 hrs of therapy per day at least 5 days per week? Yes 6. The potential for patient to make measurable gains while on inpatient rehab is excellent 7. Anticipated functional outcomes upon discharge from inpatient rehab are modified independent and supervision  with PT, modified independent and supervision with OT, min assist with SLP. 8. Estimated rehab length of stay to reach the above functional goals is: 10-14d 9. Anticipated D/C setting: Home 10. Anticipated post D/C treatments: [facility] therapy 11. Overall Rehab/Functional Prognosis: excellent  RECOMMENDATIONS: This patient's condition is appropriate for continued rehabilitative care in the following setting: CIR Patient has agreed to participate in recommended program. Yes Note that insurance prior authorization may be required for reimbursement for recommended care.  Comment: family preferring CIR   Cathlyn Parsons, PA-C 10/05/2017      Revision History                             Routing History

## 2017-10-08 NOTE — Care Management Note (Signed)
Inpatient Rehabilitation Center Individual Statement of Services  Patient Name:  [name]  Date:  10/08/2017  Welcome to the [facility].  Our goal is to provide you with an individualized program based on your diagnosis and situation, designed to meet your specific needs.  With this comprehensive rehabilitation program, you will be expected to participate in at least 3 hours of rehabilitation therapies Monday-Friday, with modified therapy programming on the weekends.  Your rehabilitation program will include the following services:  Physical Therapy (PT), Occupational Therapy (OT), Speech Therapy (ST), 24 hour per day rehabilitation nursing, Case Management (Social Worker), Rehabilitation Medicine, Nutrition Services and Pharmacy Services  Weekly team conferences will be held on Wednesday to discuss your progress.  Your Social Worker will talk with you frequently to get your input and to update you on team discussions.  Team conferences with you and your family in attendance may also be held.  Expected length of stay: 14-18 Days  Overall anticipated outcome: supervision level  Depending on your progress and recovery, your program may change. Your Social Worker will coordinate services and will keep you informed of any changes. Your Social Worker's name and contact numbers are listed  below.  The following services may also be recommended but are not provided by the [facility] will be made to provide these services after discharge if needed.  Arrangements include referral to agencies that provide these services.  Your insurance has been verified to be:  [employer] Your primary doctor is:  Allyn Kenner  Pertinent information will be shared with your doctor and your insurance company.  Social  Worker:  Ovidio Kin, [place] or ([phone]  Information discussed with and copy given to patient by: Elease Hashimoto, 10/08/2017, 1:21 PM

## 2017-10-08 NOTE — Evaluation (Signed)
Occupational Therapy Assessment and Plan  Patient Details  Name: [name] MRN: [id number] Date of Birth: March 19, 1929  OT Diagnosis: muscle weakness (generalized) Rehab Potential: Rehab Potential (ACUTE ONLY): Good ELOS: 14-17 days   Today's Date: 10/08/2017 OT Individual Time: 1415-1530 OT Individual Time Calculation (min): 75 min     Problem List:  Patient Active Problem List   Diagnosis Date Noted  . 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 Aphasia   . PAF (paroxysmal atrial fibrillation) ([place])   . Diabetes mellitus type 2 in obese ([place])   . Benign essential HTN   . Hypothyroidism   . Hyperlipidemia   . Stroke (cerebrum) ([place]) 10/02/2017  . Afib ([place]) 01/10/2017  . SOB (shortness of breath) 01/10/2017  . Acute diastolic CHF (congestive heart failure) ([employer]) 01/10/2017  . Atrial fibrillation with RVR ([place]) 01/10/2017  . Claudication ([place]) 07/25/2016  . Paroxysmal atrial fibrillation ([place]) 07/25/2016  . Odynophagia 04/10/2014  . Allergic rhinitis 03/23/2014  . Chronic rhinitis 12/22/2013  . 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 Constipation 08/22/2011  . Obesity 09/20/2009  . G E R D 11/29/2007  . Celiac disease 11/29/2007    Past Medical History:  Past Medical History:  Diagnosis Date  . Allergic rhinitis   . Asthmatic bronchitis   . Atrial fibrillation ([place])   . Celiac disease   . Cellulitis   . Diverticulosis   . DM (diabetes mellitus) ([place])   . Fx. left wrist 1987  . Gastric polyps   . GERD (gastroesophageal reflux disease)   . History of pneumonia    w/pleural effusion  . Hypothyroidism   . Iron deficiency anemia   . Melanoma ([place])   . Neuropathy   . Prostate cancer ([place])   . REM sleep behavior disorder   . RLS (restless legs syndrome)   . Sleep apnea    Past Surgical History:  Past Surgical History:  Procedure Laterality Date  . APPENDECTOMY    . CARPAL TUNNEL RELEASE    . CATARACT EXTRACTION  03/2011   bilateral   .  CHOLECYSTECTOMY  2001  . COLONOSCOPY W/ BIOPSIES  04/27/2008   diverticulosis, duodenitis  . FOOT SURGERY    . HERNIA REPAIR  1994   bilateral  . KNEE SURGERY     x 2  . LUNG SURGERY  2001  . PROSTATECTOMY  2002  . UPPER GASTROINTESTINAL ENDOSCOPY  04/27/2008   celiac disease, gastric polyps    Assessment & Plan Clinical Impression: Patient is a 83 y.o. year old male with recent admission to the hospital on right handed malewith history of diabetes mellituswith peripheral neuropathy, hypothyroidism, atrial fibrillation maintained on aspirin and multiple falls. Presented 10/02/2017 with aphasia. Per chart review patient lives with spouse independent prior to [place] still driving.Wife can assist.Cranial CT scan reviewed, unremarkable for acute process.  Patient did receive TPA.     Patient transferred to CIR on 10/07/2017 .    Patient currently requires min with basic self-care skills secondary to muscle weakness, impaired timing and sequencing and decreased coordination, decreased motor planning, and decreased sitting balance, decreased standing balance and decreased balance strategies.  Prior to hospitalization, patient could complete ADLs with IND.  Patient will benefit from skilled intervention to decrease level of assist with basic self-care skills prior to discharge home with care partner.  Anticipate patient will require 24 hour supervision and follow up home health.  OT - End of Session Activity Tolerance: Tolerates 30+ min activity with multiple  rests Endurance Deficit: Yes Endurance Deficit Description: Requires rest due to fatigue OT Assessment Rehab Potential (ACUTE ONLY): Good OT Barriers to Discharge: Decreased caregiver support OT Barriers to Discharge Comments: decreased ability to complete ADLs due to weakness  OT Patient demonstrates impairments in the following area(s): Balance;Perception;Safety;Sensory;Motor;Endurance OT Basic ADL's Functional Problem(s):  Eating;Grooming;Bathing;Dressing;Toileting OT Transfers Functional Problem(s): Toilet OT Plan OT Intensity: Minimum of 1-2 x/day, 45 to 90 minutes OT Frequency: 5 out of 7 days OT Duration/Estimated Length of Stay: 14-17 days OT Treatment/Interventions: Balance/vestibular training;Discharge planning;Self Care/advanced ADL retraining;Therapeutic Activities;UE/LE Coordination activities;Patient/family education;Therapeutic Exercise;Functional mobility training;DME/adaptive equipment instruction OT Self Feeding Anticipated Outcome(s): mod I  OT Basic Self-Care Anticipated Outcome(s): S for memory OT Toileting Anticipated Outcome(s): S for memory OT Bathroom Transfers Anticipated Outcome(s): S for memory OT Recommendation Patient destination: Home Follow Up Recommendations: 24 hour supervision/assistance Equipment Recommended: To be determined Equipment Details: reacher   Skilled Therapeutic Intervention Chart review and assessment, development of POC, review of goals/plan with pt completed.  Treatment session focused on ADLs/self care training, transfer training, pt education, safety awareness, compensatory/energy conservation techniques, and AE/AD training. Upon entering, pt supine in bed and agreeable to AM ADL session. Pt completed Bed mob with S to sit EOB with min A. Therapist explained shower set up to pt and agreeable to shower. Pt transferred from bed to stand with walker and min A. Completed functional mobility into bathroom with min A and shower tx with min A. Pt requires continual v/c for safety during functional mobility and transfers for hand/foot placement. Noted decreased sensation Bil LE d/t PN. UB d/b required min A, LB d/b required mod A in sit<>stand level. May benefit from [facility] and Reacher for LB tasks. Therapist administered AE for next ADL session.  Pt returned to bed to rest with all needs met.   OT Evaluation Precautions/Restrictions  Precautions Precautions:  Fall Precaution Comments: Absent sensation in B LE distal to knee; frequent faller prior to admission Restrictions Weight Bearing Restrictions: No General   Vital Signs Therapy Vitals Temp: 98.6 F (37 C) Temp Source: Oral Pulse Rate: 66 Resp: 18 BP: (!) 146/53 Patient Position (if appropriate): Lying Oxygen Therapy SpO2: 100 % O2 Device: Not DeliveredBP: 158/55  Pain Pain Assessment Pain Assessment: No/denies pain Home Living/Prior Functioning Home Living Living Arrangements: Spouse/significant other Available Help at Discharge: Available 24 hours/day Type of Home: House Home Access: Stairs to enter Technical brewer of Steps: (4) Entrance Stairs-Rails: (One side only) Home Layout: One level Bathroom Shower/Tub: Tub/shower unit, Architectural technologist: Standard Additional Comments: confirmed with wife  Lives With: Spouse Prior Function Level of Independence: Other (comment)(walker at night; frequent falls)  Able to Take Stairs?: Yes 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) ADL ADL Equipment Provided: Reacher, Long-handled sponge Eating: Supervision/safety Where Assessed-Eating: Wheelchair Grooming: Contact guard Where Assessed-Grooming: Standing at sink Upper Body Bathing: Contact guard Where Assessed-Upper Body Bathing: Shower Lower Body Bathing: Moderate assistance Where Assessed-Lower Body Bathing: Shower Upper Body Dressing: Setup Where Assessed-Upper Body Dressing: Edge of bed Lower Body Dressing: Moderate assistance Where Assessed-Lower Body Dressing: Edge of bed Toileting: Minimal assistance Where Assessed-Toileting: Glass blower/designer: Psychiatric nurse Method: Counselling psychologist: Energy manager: Not assessed Social research officer, government: Environmental education officer Method: Heritage manager: Grab bars, Shower seat with back ADL  Comments: B LE decreased sensation; requires min A for safety during transfers and functional mobility during ADLs  Vision Baseline Vision/History: Wears glasses  Wears Glasses: At all times Patient Visual Report: No change from baseline Vision Assessment?: Vision impaired- to be further tested in functional context Perception  Perception: Within Functional Limits Praxis Praxis: Impaired Praxis Impairment Details: Ideation;Motor planning Praxis-Other Comments: decreased spatial awareness; bumping into things during functional mob with walker and shower transfers  Cognition Overall Cognitive Status: Within Functional Limits for tasks assessed Arousal/Alertness: Awake/alert Orientation Level: Place;Situation Year: 2017 Month: December Day of Week: Correct Memory: Appears intact Memory Impairment: Decreased recall of new information Immediate Memory Recall: Sock;Blue Attention: Focused Focused Attention: Appears intact Awareness: Appears intact Problem Solving: Appears intact Safety/Judgment: Appears intact Comments: forgetfulness of new information, will ask for help or says he can;t remember (good insight) Sensation Sensation Light Touch: Impaired Detail Light Touch Impaired Details: Impaired LLE;Impaired RLE(peripheral neuropathy) Coordination Gross Motor Movements are Fluid and Coordinated: No Fine Motor Movements are Fluid and Coordinated: No Coordination and Movement Description: WFL for ADLs and transfers  Finger Nose Finger Test: Dysmetria noted with finger to nose b/l and B UE/LE RAMPS impaired with decreased accuracy Motor  Motor Motor: Within Functional Limits Mobility  Bed Mobility Bed Mobility: Supine to Sit;Sit to Supine Supine to Sit: 5: Supervision;HOB elevated Supine to Sit Details: Verbal cues for precautions/safety Sit to Supine: 5: Supervision;HOB elevated Sit to Supine - Details: Verbal cues for precautions/safety Transfers Transfers: Sit to Stand Sit  to Stand: 4: Min assist Sit to Stand Details: Verbal cues for precautions/safety;Verbal cues for technique Sit to Stand Details (indicate cue type and reason): minimal LOB   Trunk/Postural Assessment  Cervical Assessment Cervical Assessment: Exceptions to WFL(lordosis) Thoracic Assessment Thoracic Assessment: Exceptions to WFL(thorasic kyphotic) Lumbar Assessment Lumbar Assessment: Exceptions to WFL(posterior pelvic tilt ) Postural Control Postural Control: Deficits on evaluation(posterior lean in sitting ; requires v/c)  Balance Balance Balance Assessed: Yes Static Sitting Balance Static Sitting - Balance Support: Feet unsupported Static Sitting - Level of Assistance: 4: Min assist Static Sitting - Comment/# of Minutes: tolerate with 1 UE  sitting for up to 15 mins Dynamic Sitting Balance Dynamic Sitting - Balance Support: During functional activity(diff with forward flexion / LOB ) Dynamic Sitting - Level of Assistance: 4: Min assist Dynamic Sitting - Balance Activities: Trunk control activities Static Standing Balance Static Standing - Balance Support: Bilateral upper extremity supported Dynamic Standing Balance Dynamic Standing - Balance Support: Bilateral upper extremity supported;During functional activity(requires 1 UE supported d/t decreaed balance ) Extremity/Trunk Assessment RUE Assessment RUE Assessment: Within Functional Limits LUE Assessment LUE Assessment: Within Functional Limits   See Function Navigator for Current Functional Status.   Refer to Care Plan for Long Term Goals  Recommendations for other services: None    Discharge Criteria: Patient will be discharged from OT if patient refuses treatment 3 consecutive times without medical reason, if treatment goals not met, if there is a change in medical status, if patient makes no progress towards goals or if patient is discharged from hospital.  The above assessment, treatment plan, treatment alternatives  and goals were discussed and mutually agreed upon: by patient  Delon [place] 10/08/2017, 4:02 PM

## 2017-10-08 NOTE — Progress Notes (Signed)
Patient information reviewed and entered into eRehab system by Monisha Siebel, RN, CRRN, PPS Coordinator.  Information including medical coding and functional independence measure will be reviewed and updated through discharge.     Per nursing patient was given "Data Collection Information Summary for Patients in Inpatient Rehabilitation Facilities with attached "Privacy Act Statement-Health Care Records" upon admission.  

## 2017-10-08 NOTE — Plan of Care (Signed)
Long term goals established on 10-08-17

## 2017-10-09 ENCOUNTER — Inpatient Hospital Stay (HOSPITAL_COMMUNITY): Payer: PPO | Admitting: Physical Therapy

## 2017-10-09 ENCOUNTER — Inpatient Hospital Stay (HOSPITAL_COMMUNITY): Payer: PPO | Admitting: Speech Pathology

## 2017-10-09 ENCOUNTER — Inpatient Hospital Stay (HOSPITAL_COMMUNITY): Payer: PPO | Admitting: Occupational Therapy

## 2017-10-09 DIAGNOSIS — K5901 Slow transit constipation: Secondary | ICD-10-CM

## 2017-10-09 DIAGNOSIS — I69398 Other sequelae of cerebral infarction: Secondary | ICD-10-CM

## 2017-10-09 DIAGNOSIS — R269 Unspecified abnormalities of gait and mobility: Secondary | ICD-10-CM

## 2017-10-09 LAB — GLUCOSE, CAPILLARY
GLUCOSE-CAPILLARY: 125 mg/dL — AB (ref 65–99)
GLUCOSE-CAPILLARY: 141 mg/dL — AB (ref 65–99)
Glucose-Capillary: 135 mg/dL — ABNORMAL HIGH (ref 65–99)
Glucose-Capillary: 153 mg/dL — ABNORMAL HIGH (ref 65–99)

## 2017-10-09 MED ORDER — SENNOSIDES-DOCUSATE SODIUM 8.6-50 MG PO TABS
2.0000 | ORAL_TABLET | Freq: Two times a day (BID) | ORAL | Status: DC
  Administered 2017-10-09 – 2017-10-14 (×4): 2 via ORAL
  Filled 2017-10-09 (×8): qty 2

## 2017-10-09 NOTE — Progress Notes (Signed)
Subjective/Complaints:  No stools, KUB showing moderate stool no distended small bowel  ROS- some limitation from aphasia, see above  Objective: Vital Signs: Blood pressure (!) 132/58, pulse 69, temperature 98.1 F (36.7 C), temperature source Oral, resp. rate 17, height 5' 10"  (1.778 m), weight 98.7 kg (217 lb 9.5 oz), SpO2 94 %. Dg Abd 1 View  Result Date: 10/08/2017 CLINICAL DATA:  83 year old male with abdominal distention and diarrhea for the past week. Initial encounter. EXAM: ABDOMEN - 1 VIEW COMPARISON:  None. FINDINGS: Moderate stool throughout the ascending, transverse and descending colon. Gas distended superiorly extending sigmoid colon measuring up to 9 cm. No secondary findings to suggest sigmoid volvulus. The possibility of free intraperitoneal air cannot be assessed on a supine view. Post cholecystectomy. Surgical clips within the pelvis. Degenerative changes lumbar spine. IMPRESSION: Moderate stool throughout the ascending, transverse and descending colon. Gas distended superiorly extending sigmoid colon measuring up to 9 cm. No secondary findings to suggest sigmoid volvulus. Electronically Signed   By: Genia Del M.D.   On: 10/08/2017 12:07   Results for orders placed or performed during the hospital encounter of 10/07/17 (from the past 72 hour(s))  Glucose, capillary     Status: Abnormal   Collection Time: 10/07/17  4:46 PM  Result Value Ref Range   Glucose-Capillary 101 (H) 65 - 99 mg/dL  Glucose, capillary     Status: Abnormal   Collection Time: 10/07/17  9:04 PM  Result Value Ref Range   Glucose-Capillary 280 (H) 65 - 99 mg/dL  Glucose, capillary     Status: Abnormal   Collection Time: 10/08/17  6:24 AM  Result Value Ref Range   Glucose-Capillary 133 (H) 65 - 99 mg/dL  CBC WITH DIFFERENTIAL     Status: Abnormal   Collection Time: 10/08/17  8:48 AM  Result Value Ref Range   WBC 9.9 4.0 - 10.5 K/uL   RBC 3.95 (L) 4.22 - 5.81 MIL/uL   Hemoglobin 12.6 (L) 13.0  - 17.0 g/dL   HCT 38.2 (L) 39.0 - 52.0 %   MCV 96.7 78.0 - 100.0 fL   MCH 31.9 26.0 - 34.0 pg   MCHC 33.0 30.0 - 36.0 g/dL   RDW 13.3 11.5 - 15.5 %   Platelets 309 150 - 400 K/uL   Neutrophils Relative % 70 %   Neutro Abs 6.9 1.7 - 7.7 K/uL   Lymphocytes Relative 17 %   Lymphs Abs 1.7 0.7 - 4.0 K/uL   Monocytes Relative 10 %   Monocytes Absolute 1.0 0.1 - 1.0 K/uL   Eosinophils Relative 3 %   Eosinophils Absolute 0.3 0.0 - 0.7 K/uL   Basophils Relative 0 %   Basophils Absolute 0.0 0.0 - 0.1 K/uL  Comprehensive metabolic panel     Status: Abnormal   Collection Time: 10/08/17  8:48 AM  Result Value Ref Range   Sodium 137 135 - 145 mmol/L   Potassium 3.5 3.5 - 5.1 mmol/L   Chloride 102 101 - 111 mmol/L   CO2 24 22 - 32 mmol/L   Glucose, Bld 202 (H) 65 - 99 mg/dL   BUN 20 6 - 20 mg/dL   Creatinine, Ser 1.21 0.61 - 1.24 mg/dL   Calcium 8.8 (L) 8.9 - 10.3 mg/dL   Total Protein 7.0 6.5 - 8.1 g/dL   Albumin 3.3 (L) 3.5 - 5.0 g/dL   AST 37 15 - 41 U/L   ALT 33 17 - 63 U/L   Alkaline Phosphatase 87 38 -  126 U/L   Total Bilirubin 0.8 0.3 - 1.2 mg/dL   GFR calc non Af Amer 52 (L) >60 mL/min   GFR calc Af Amer >60 >60 mL/min    Comment: (NOTE) The eGFR has been calculated using the CKD EPI equation. This calculation has not been validated in all clinical situations. eGFR's persistently <60 mL/min signify possible Chronic Kidney Disease.    Anion gap 11 5 - 15  Glucose, capillary     Status: Abnormal   Collection Time: 10/08/17 11:46 AM  Result Value Ref Range   Glucose-Capillary 115 (H) 65 - 99 mg/dL  Glucose, capillary     Status: Abnormal   Collection Time: 10/08/17  4:36 PM  Result Value Ref Range   Glucose-Capillary 132 (H) 65 - 99 mg/dL  Glucose, capillary     Status: Abnormal   Collection Time: 10/08/17 10:18 PM  Result Value Ref Range   Glucose-Capillary 105 (H) 65 - 99 mg/dL  Glucose, capillary     Status: Abnormal   Collection Time: 10/09/17  6:46 AM  Result Value  Ref Range   Glucose-Capillary 125 (H) 65 - 99 mg/dL       Physical Exam  Vitals reviewed. HENT:  Head: Normocephalic.  Eyes: EOM are normal.  Neck: Normal range of motion. Neck supple. No thyromegaly present.  Cardiovascular:  Cardiac rate controlled  Respiratory: Effort normal and breath sounds normal. No respiratory distress.  GI: Soft. Bowel sounds are normal. He exhibits no distension.  Neurological: He is alert.  Makes good eye contact with examiner. Patient with some subtle expressive receptive aphasia. He was able to state his name and age as well as follow simple commands.  Motor:  5/5 in L delt , Bi , Tri, Grip, HF, KE , ADF, 5- on left side Sensation: reduced LT below the tibial tubercle bilateral Difficult with naming spontaneously as well as visual objects such as ring, able to repeat Comprehension appears intact   Assessment/Plan: 1. Functional deficits secondary to Left MCA infarct with gait disorder , aphasia which require 3+ hours per day of interdisciplinary therapy in a comprehensive inpatient rehab setting. Physiatrist is providing close team supervision and 24 hour management of active medical problems listed below. Physiatrist and rehab team continue to assess barriers to discharge/monitor patient progress toward functional and medical goals. FIM: Function - Bathing Position: Shower Body parts bathed by patient: Right arm, Left arm, Chest, Abdomen, Front perineal area, Left upper leg, Right upper leg Body parts bathed by helper: Buttocks, Back, Left lower leg, Right lower leg Assist Level: Touching or steadying assistance(Pt > 75%)  Function- Upper Body Dressing/Undressing What is the patient wearing?: Pull over shirt/dress Pull over shirt/dress - Perfomed by patient: Thread/unthread right sleeve, Thread/unthread left sleeve, Put head through opening, Pull shirt over trunk Function - Lower Body Dressing/Undressing What is the patient wearing?: Pants,  Non-skid slipper socks Position: Sitting EOB Pants- Performed by patient: Thread/unthread right pants leg, Thread/unthread left pants leg, Pull pants up/down Non-skid slipper socks- Performed by patient: Don/doff left sock Non-skid slipper socks- Performed by helper: Don/doff right sock Assist for footwear: Partial/moderate assist Assist for lower body dressing: Touching or steadying assistance (Pt > 75%)  Function - Toileting Toileting activity did not occur: Safety/medical concerns Toileting steps completed by patient: Adjust clothing prior to toileting, Performs perineal hygiene, Adjust clothing after toileting Assist level: Supervision or verbal cues  Function - Air cabin crew transfer assistive device: Walker Assist level to toilet: Touching or steadying  assistance (Pt > 75%) Assist level from toilet: Touching or steadying assistance (Pt > 75%)  Function - Chair/bed transfer Chair/bed transfer assist level: Touching or steadying assistance (Pt > 75%) Chair/bed transfer assistive device: Walker, Armrests Chair/bed transfer details: Verbal cues for technique, Verbal cues for precautions/safety, Verbal cues for safe use of DME/AE, Verbal cues for sequencing  Function - Locomotion: Wheelchair Will patient use wheelchair at discharge?: Yes(Maybe for community) Type: Manual Max wheelchair distance: 150 ft Assist Level: Supervision or verbal cues Assist Level: Supervision or verbal cues Assist Level: Supervision or verbal cues Turns around,maneuvers to table,bed, and toilet,negotiates 3% grade,maneuvers on rugs and over doorsills: No Function - Locomotion: Ambulation Assistive device: Walker-rolling Max distance: 20 ft Assist level: Touching or steadying assistance (Pt > 75%) Assist level: Touching or steadying assistance (Pt > 75%) Walk 50 feet with 2 turns activity did not occur: Safety/medical concerns(Endurance) Walk 150 feet activity did not occur: Safety/medical  concerns Walk 10 feet on uneven surfaces activity did not occur: Safety/medical concerns  Function - Comprehension Comprehension: Auditory Comprehension assist level: Understands basic 75 - 89% of the time/ requires cueing 10 - 24% of the time  Function - Expression Expression: Verbal Expression assist level: Expresses basic 50 - 74% of the time/requires cueing 25 - 49% of the time. Needs to repeat parts of sentences.  Function - Social Interaction Social Interaction assist level: Interacts appropriately with others with medication or extra time (anti-anxiety, antidepressant).  Function - Problem Solving Problem solving assist level: Solves basic 90% of the time/requires cueing < 10% of the time  Function - Memory Memory assist level: More than reasonable amount of time Patient normally able to recall (first 3 days only): That he or she is in a hospital  Medical Problem List and Plan: 1.  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 CIR PT, OT 2.  DVT Prophylaxis/Anticoagulation: Eliquis 3. Pain Management: Tylenol as needed  4. Mood: Provide emotional support  5. Neuropsych: This patient is  capable of making decisions on his  own behalf. 6. Skin/Wound Care: Routine skin checks  7. Fluids/Electrolytes/Nutrition: Routine I&O's with follow-up chemistries 8. Atrial fibrillation. Cardiac rate controlled. Continue Eliquis 9. Diabetes mellitus peripheral neuropathy. Hemoglobin A1c 7.8. SSI. Patient on Glucotrol 2.5 mg daily prior to admission. Resume when intake improves CBG controlled 12/28 CBG (last 3)  Recent Labs    10/08/17 1636 10/08/17 2218 10/09/17 0646  GLUCAP 132* 105* 125*   10. Hypertension. Permissive hypertension and monitor Lopressor 75 mg twice a day, Lasix 20 mg daily Vitals:   10/08/17 2000 10/09/17 0535  BP: (!) 141/60 (!) 132/58  Pulse: 70 69  Resp: 18 17  Temp: 98.6 F (37 C) 98.1 F (36.7 C)   SpO2: 98% 94%  adequate control 11. Hypothyroidism. TSH 4.307. Continue Synthroid 12. Hyperlipidemia. Lipitor 13. Obesity             Body mass index is 31.1 kg/m.             Diet and exercise education             Encourage weight loss to increase endurance and promote overall health  14.  Was having loose stools but now constipated with a moderate stool burden on Xray, DC enteric prec, start senna S LOS (Days) 2 A FACE TO FACE EVALUATION WAS PERFORMED  [name] 10/09/2017, 7:59 AM

## 2017-10-09 NOTE — Progress Notes (Signed)
Occupational Therapy Session Note  Patient Details  Name: [name] MRN: [id number] Date of Birth: 09/26/1929  Today's Date: 10/09/2017 OT Individual Time: 1300-1415 OT Individual Time Calculation (min): 75 min    Short Term Goals: Week 1:  OT Short Term Goal 1 (Week 1): Pt will complete toileting tasks with min A  OT Short Term Goal 2 (Week 1): Pt will complete shower transfer using grab bars with min A  OT Short Term Goal 3 (Week 1): Pt will tolerate standing activity for 31m with support for ADLs OT Short Term Goal 4 (Week 1): Pt will completed LB dressing using AE/AD PRN with min A  Skilled Therapeutic Interventions/Progress Updates:    treatment session focused on ADLs/self care training, AE/AD training, transfer training, activity tolerance/endurance, and safety awareness training.   Upon entering pt supine in bed resting and agreeable to AM ADls. Pt moved from supine to EOb sit with min A and transferred to shower with RW and min A with v/c for safety awareness. Pt instructed on AE for d/b tasks and demo'ed functional use 1/1 trials. Pt completed all bathing tasks with min A at sit/stand level using grab bars. Pt completed UB dressing in sitting level with S and LB dressing with min A with v/c for use of reacher for task. Therapist provided safety awareness techniques during transfers for hand/foot placement. Pt demo'ed technique 3/3 trials without v/c. Pt completed w/c mobility in facility with S. Standing tolerance task complete for table top task: pt attended to task for up to 10 m in standing with minimal cuing and redirection. Pt returned to room via w/c and transferred into bed. Therapist reminded pt on importance of calling for NT for assistance with in room mobility. Pt left resting in bed with call bell within reach.   Therapy Documentation Precautions:  Precautions Precautions: Fall Precaution Comments: Absent sensation in B LE distal to knee; frequent faller prior to  admission Restrictions Weight Bearing Restrictions: No   Vital Signs: Therapy Vitals Temp: 98.2 F (36.8 C) Temp Source: Oral Pulse Rate: 69 Resp: 17 BP: (!) 121/55 Patient Position (if appropriate): Lying Oxygen Therapy SpO2: 99 % O2 Device: Not Delivered Pain: Pain Assessment Pain Assessment: No/denies pain ADL: ADL Equipment Provided: Reacher, Long-handled sponge Eating: Supervision/safety Where Assessed-Eating: Wheelchair Grooming: Contact guard Where Assessed-Grooming: Standing at sink Upper Body Bathing: Contact guard Where Assessed-Upper Body Bathing: Shower Lower Body Bathing: Moderate assistance Where Assessed-Lower Body Bathing: Shower Upper Body Dressing: Setup Where Assessed-Upper Body Dressing: Edge of bed Lower Body Dressing: Moderate assistance Where Assessed-Lower Body Dressing: Edge of bed Toileting: Minimal assistance Where Assessed-Toileting: Glass blower/designer: Psychiatric nurse Method: Counselling psychologist: Energy manager: Not assessed Social research officer, government: Environmental education officer Method: Heritage manager: Grab bars, Shower seat with back ADL Comments: B LE decreased sensation; requires min A for safety during transfers and functional mobility during ADLs  See Function Navigator for Current Functional Status.   Therapy/Group: Individual Therapy  Delon Sacramento 10/09/2017, 3:49 PM

## 2017-10-09 NOTE — Progress Notes (Signed)
Speech Language Pathology Daily Session Note  Patient Details  Name: [name] MRN: [id number] Date of Birth: [birth year]  Today's Date: 10/09/2017 SLP Individual Time: 0730-0830 SLP Individual Time Calculation (min): 60 min  Short Term Goals: Week 1: SLP Short Term Goal 1 (Week 1): Patient will self-monitor and correct verbal errors at the phrase level with Min A multimodal cues.  SLP Short Term Goal 2 (Week 1): Patient will perform oral reading tasks with 75% accuracy and Min A verbal cues.  SLP Short Term Goal 3 (Week 1): Patient will follow 2-step commnands with 25% accuracy and Mod A verbal cues.  SLP Short Term Goal 4 (Week 1): Patient will utilize word-finding strategies at the phrase level with Min A verbal cues.   Skilled Therapeutic Interventions: Skilled treatment session focused on functional communication goals. Throughout an informal and functional conversation, patient self-monitored and corrected verbal errors in ~90% of opportunities with overall supervision verbal cues . Patient also decoded at the phrase level with Min A visual and phonemic cues to self-monitor and correct errors. Patient left upright in bed with all needs within reach. Continue with current plan of care.      Function:   Cognition Comprehension Comprehension assist level: Follows basic conversation/direction with no assist  Expression   Expression assist level: Expresses basic 75 - 89% of the time/requires cueing 10 - 24% of the time. Needs helper to occlude trach/needs to repeat words.  Social Interaction Social Interaction assist level: Interacts appropriately with others with medication or extra time (anti-anxiety, antidepressant).  Problem Solving Problem solving assist level: Solves basic problems with no assist  Memory Memory assist level: More than reasonable amount of time    Pain Pain Assessment Pain Assessment: No/denies pain  Therapy/Group: Individual Therapy  Michella Detjen,  [place] 10/09/2017, 3:52 PM

## 2017-10-09 NOTE — Progress Notes (Signed)
Physical Therapy Session Note  Patient Details  Name: [name] MRN: [id number] Date of Birth: June 18, 1929  Today's Date: 10/09/2017 PT Individual Time: 0900-1000 PT Individual Time Calculation (min): 60 min   Short Term Goals: Week 1:  PT Short Term Goal 1 (Week 1): Pt will perform sit to stand from w/c to walker with safe technique with min A for 3/3 reps PT Short Term Goal 2 (Week 1): Pt will perform supine to sit and return with min A PT Short Term Goal 3 (Week 1): Pt will ambulate 100 ft with min A and least restrictive assistive device PT Short Term Goal 4 (Week 1): Pt and wife will be issued written handouts on fall risk reduction in the home environment.  Skilled Therapeutic Interventions/Progress Updates:    no c/o pain.  Session focus on activity tolerance, balance, and safety awareness with functional mobility.    Pt transitions to EOB with supervision.  Transfers throughout session with RW (sit<>stand and stand/pivot) progressing from steady assist to close supervision throughout session, occasional verbal cues for hand placement (<10% cuing).  W/C mobility 2x150' with BUEs for activity tolerance.  Gait training x100' with RW and steady assist focus on increasing gait distance safely.    Pt engaged in balance/cognitive activities as follows: - reaching up/across midline for horseshoes on firm surface with alternating UE support - horseshoes game focus on reaching across midline and switching hands - seated peg board design with supervision to complete complex design, pt able to switch completed design to a new design without cues - standing pipe tree on firm surface with supervision - standing pipe tree on foam with min assist and verbal cues for R knee extension  Pt returned to room at end of session and positioned upright in w/c with QRB in place, call bell in reach and needs met.   Therapy Documentation Precautions:  Precautions Precautions: Fall Precaution  Comments: Absent sensation in B LE distal to knee; frequent faller prior to admission Restrictions Weight Bearing Restrictions: No   See Function Navigator for Current Functional Status.   Therapy/Group: Individual Therapy  Michel Santee 10/09/2017, 10:03 AM

## 2017-10-10 ENCOUNTER — Inpatient Hospital Stay (HOSPITAL_COMMUNITY): Payer: PPO

## 2017-10-10 ENCOUNTER — Inpatient Hospital Stay (HOSPITAL_COMMUNITY): Payer: PPO | Admitting: Physical Therapy

## 2017-10-10 LAB — GLUCOSE, CAPILLARY
GLUCOSE-CAPILLARY: 195 mg/dL — AB (ref 65–99)
GLUCOSE-CAPILLARY: 208 mg/dL — AB (ref 65–99)
GLUCOSE-CAPILLARY: 146 mg/dL — AB (ref 65–99)
Glucose-Capillary: 160 mg/dL — ABNORMAL HIGH (ref 65–99)

## 2017-10-10 MED ORDER — TRAZODONE HCL 50 MG PO TABS
25.0000 mg | ORAL_TABLET | Freq: Every evening | ORAL | Status: DC | PRN
  Administered 2017-10-10 – 2017-10-15 (×3): 25 mg via ORAL
  Filled 2017-10-10 (×3): qty 1

## 2017-10-10 NOTE — Progress Notes (Signed)
Speech Language Pathology Daily Session Note  Patient Details  Name: [name] MRN: [id number] Date of Birth: 1929-09-18  Today's Date: 10/10/2017 SLP Individual Time: 0263-7858 SLP Individual Time Calculation (min): 43 min  Short Term Goals: Week 1: SLP Short Term Goal 1 (Week 1): Patient will self-monitor and correct verbal errors at the phrase level with Min A multimodal cues.  SLP Short Term Goal 2 (Week 1): Patient will perform oral reading tasks with 75% accuracy and Min A verbal cues.  SLP Short Term Goal 3 (Week 1): Patient will follow 2-step commnands with 25% accuracy and Mod A verbal cues.  SLP Short Term Goal 4 (Week 1): Patient will utilize word-finding strategies at the phrase level with Min A verbal cues.   Skilled Therapeutic Interventions: Skilled ST services focused on cognitive skills. SLP facilitated decoding skills at sentence level requiring mod-max A  and phrases level requiring min-Mod A phonemic cues. Pt demonstrated error monitoring during decoding tasks with supervision cues and required mod A visual and phonemic cues for correction. SLP facilitated word finding skills with communication barrier tasks, pt required min A verbal cues. SLp facilitated ability to follow 2 step body movement directions after initially following 1 step directions with 100% accuracy and 2 step directions required min-mod A visual cues. Pt was left in room with call bell within reach. Recommend to continue skilled St services.      Function:  Eating Eating                 Cognition Comprehension Comprehension assist level: Follows basic conversation/direction with no assist  Expression   Expression assist level: Expresses basic 75 - 89% of the time/requires cueing 10 - 24% of the time. Needs helper to occlude trach/needs to repeat words.  Social Interaction Social Interaction assist level: Interacts appropriately with others with medication or extra time (anti-anxiety,  antidepressant).  Problem Solving Problem solving assist level: Solves basic problems with no assist  Memory Memory assist level: More than reasonable amount of time    Pain Pain Assessment Pain Assessment: No/denies pain  Therapy/Group: Individual Therapy  Naeem Quillin  [facility] 10/10/2017, 9:29 AM

## 2017-10-10 NOTE — Progress Notes (Signed)
Occupational Therapy Session Note  Patient Details  Name: [name] MRN: [id number] Date of Birth: [birth year]  Today's Date: 10/10/2017 OT Individual Time: 1330-1400 and 0700-757 OT Individual Time Calculation (min): 30 min and 57 min   Short Term Goals: Week 1:  OT Short Term Goal 1 (Week 1): Pt will complete toileting tasks with min A  OT Short Term Goal 2 (Week 1): Pt will complete shower transfer using grab bars with min A  OT Short Term Goal 3 (Week 1): Pt will tolerate standing activity for 65mwith support for ADLs OT Short Term Goal 4 (Week 1): Pt will completed LB dressing using AE/AD PRN with min A  Skilled Therapeutic Interventions/Progress Updates:    1:1. Noc/o pain/. Pt agreeable to shower this date. Pt ambualtes with CGA to transfer onto TTB with VC for hand placement. Pt bathes at sit to stand level with S-CGA for standing balance while cleansing buttocks/peri area. Pt dons shirt with S/set up, and pt able to assume seated figure 4 to thread BLE into pants and don socks. Pt stands at sink withS-CGA to groom at sink for 9 min. Pt ambulates to/from tx gym with RW with CGA and VC for looking ahead while walking. Pt stands on blue foam pad for reaching/balacne activity crossing midline with BUE to place clothes pins on high target with touching A for balacne. Exited session with pt seated in bed, exit alarm on, call light in reach and MD entering room.  Session 2: Pt in w/c upon arrival ready to go. Pt with no c/o pain. Pt propels w/c to ADL apartment with supervision. OT educates on energy conservation techniques with use of walker bag in kitchen. Pt ambulates throughout ADL apartment with supervision retrieving items from various cabinets between waste-over head height to simulate simple meal prep with VC for RW management and placement of RW when reaching. Pt ambulates back to room with supervision. OT exits session with pt seated in w/c with call light in reach and all needs  met.    Therapy Documentation Precautions:  Precautions Precautions: Fall Precaution Comments: Absent sensation in B LE distal to knee; frequent faller prior to admission Restrictions Weight Bearing Restrictions: No General:   Vital Signs: Therapy Vitals Temp: 97.8 F (36.6 C) Temp Source: Oral Pulse Rate: 65 Resp: 18 BP: (!) 146/56 Patient Position (if appropriate): Lying Oxygen Therapy SpO2: 100 % O2 Device: Not Delivered  See Function Navigator for Current Functional Status.   Therapy/Group: Individual Therapy  STonny Branch12/29/2018, 7:16 AM

## 2017-10-10 NOTE — Progress Notes (Signed)
Patient ID: [name], male   DOB: 04-Mar-1929, 83 y.o.   MRN: [id number]   10/10/2017.  [name] is a 83 y.o. 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 Allergic rhinitis   . Asthmatic bronchitis   . Atrial fibrillation ([place])   . Celiac disease   . Cellulitis   . Diverticulosis   . DM (diabetes mellitus) ([place])   . Fx. left wrist 1987  . Gastric polyps   . GERD (gastroesophageal reflux disease)   . History of pneumonia    w/pleural effusion  . Hypothyroidism   . Iron deficiency anemia   . Melanoma ([place])   . Neuropathy   . Prostate cancer ([place])   . REM sleep behavior disorder   . RLS (restless legs syndrome)   . Sleep apnea      Subjective: Complaining of some mild insomnia.  Frustrated slightly due to speech impairment.  Objective: Vital signs in last 24 hours: Temp:  [97.8 F (36.6 C)-98.2 F (36.8 C)] 97.8 F (36.6 C) (12/29 0521) Pulse Rate:  [65-73] 65 (12/29 0521) Resp:  [17-18] 18 (12/29 0521) BP: (121-146)/(55-56) 146/56 (12/29 0521) SpO2:  [99 %-100 %] 100 % (12/29 0521) Weight:  [214 lb 1.1 oz (97.1 kg)] 214 lb 1.1 oz (97.1 kg) (12/29 0521) Weight change: -8.4 oz (-1.6 kg) Last BM Date: 10/09/17  Intake/Output from previous day: 12/28 0701 - 12/29 0700 In: 840 [P.O.:840] Out: -  Last cbgs: CBG (last 3)  Recent Labs    10/09/17 1648 10/09/17 2147 10/10/17 0642  GLUCAP 135* 153* 160*   Patient Vitals for the past 24 hrs:  BP Temp Temp src Pulse Resp SpO2 Weight  10/10/17 0521 (!) 146/56 97.8 F (36.6 C) Oral 65 18 100 % 214 lb 1.1 oz (97.1 kg)  10/09/17 2005 (!) 141/55 - - 73 - - -  10/09/17 2003 - 98.2 F (36.8 C) Oral 68 18 99 % -  10/09/17 1435 (!) 121/55 98.2 F (36.8 C) Oral 69 17 99 % -    Physical Exam General: No apparent distress   HEENT: Unremarkable Lungs: Normal effort. Lungs clear to auscultation, no crackles or  wheezes. Cardiovascular: Irregular rhythm with a controlled ventricular response.  Grade 2/6 systolic murmur Abdomen: S/NT/ND; BS(+) Musculoskeletal: Trace pedal edema Neurological: No new neurological deficits; slight expressive dysphasia Wounds: N/A    Skin: clear  Aging changes Mental state: Alert, oriented, cooperative    Lab Results: BMET    Component Value Date/Time   NA 137 10/08/2017 0848   NA 141 05/15/2017 1506   K 3.5 10/08/2017 0848   CL 102 10/08/2017 0848   CO2 24 10/08/2017 0848   GLUCOSE 202 (H) 10/08/2017 0848   BUN 20 10/08/2017 0848   BUN 37 (H) 05/15/2017 1506   CREATININE 1.21 10/08/2017 0848   CREATININE 1.54 (H) 03/12/2017 1148   CALCIUM 8.8 (L) 10/08/2017 0848   GFRNONAA 52 (L) 10/08/2017 0848   GFRNONAA 41 (L) 03/12/2017 1148   GFRAA >60 10/08/2017 0848   GFRAA 47 (L) 03/12/2017 1148   CBC    Component Value Date/Time   WBC 9.9 10/08/2017 0848   RBC 3.95 (L) 10/08/2017 0848   HGB 12.6 (L) 10/08/2017 0848   HCT 38.2 (L) 10/08/2017 0848   PLT 309 10/08/2017 0848   MCV 96.7 10/08/2017 0848   MCH 31.9 10/08/2017 0848  MCHC 33.0 10/08/2017 0848   RDW 13.3 10/08/2017 0848   LYMPHSABS 1.7 10/08/2017 0848   MONOABS 1.0 10/08/2017 0848   EOSABS 0.3 10/08/2017 0848   BASOSABS 0.0 10/08/2017 0848      Medications: I have reviewed the patient's current medications.  Assessment/Plan:  Status post left MCA infarct with mild a aphasia and gait disorder.  Continue CIR with OT and PT DVT prophylaxis.  Continue Eliquis Chronic atrial fibrillation continue rate control and anticoagulation Diabetes mellitus.  Reasonable glycemic control but may be trending up.  Remains off pre-admit glipizide Essential hypertension.  Continue to monitor on present regimen    Length of stay, days: [place] , MD 10/10/2017, 9:54 AM

## 2017-10-10 NOTE — Progress Notes (Signed)
Physical Therapy Session Note  Patient Details  Name: [name] MRN: [id number] Date of Birth: 06-20-1929  Today's Date: 10/10/2017 PT Individual Time: 5625-6389 PT Individual Time Calculation (min): 60 min   Short Term Goals: Week 1:  PT Short Term Goal 1 (Week 1): Pt will perform sit to stand from w/c to walker with safe technique with min A for 3/3 reps PT Short Term Goal 2 (Week 1): Pt will perform supine to sit and return with min A PT Short Term Goal 3 (Week 1): Pt will ambulate 100 ft with min A and least restrictive assistive device PT Short Term Goal 4 (Week 1): Pt and wife will be issued written handouts on fall risk reduction in the home environment.  Skilled Therapeutic Interventions/Progress Updates:    no c/o pain.  Session focus on balance and functional mobility.    Pt transfers throughout session with RW and supervision.  PT administered BERG balance test, details below.  Patient demonstrates high fall risk as noted by score of 20/56 on Berg Balance Scale.  Recommending continued use of RW and assist for all mobility while progressing with PT.    NMR for balance with R/L toe taps progress to alternating toe taps with min/mod assist for balance, 2 x10 reps each.  Standing balance on 3" foam wedge with min assist during card matching activity, pt requires 1 instance of mod assist to correct LOB and max assist to come to standing on foam wedge.   Gait training x200' for activity tolerance and mobility with min cues for posture and min guard for safety.  Pt returned to room in w/c at end of session and positioned upright with call bell in reach and needs met.    Therapy Documentation Precautions:  Precautions Precautions: Fall Precaution Comments: Absent sensation in B LE distal to knee; frequent faller prior to admission Restrictions Weight Bearing Restrictions: No Balance: Standardized Balance Assessment Standardized Balance Assessment: Berg Balance Test Berg  Balance Test Sit to Stand: Able to stand using hands after several tries Standing Unsupported: Able to stand 2 minutes with supervision Sitting with Back Unsupported but Feet Supported on Floor or Stool: Able to sit safely and securely 2 minutes Stand to Sit: Sits safely with minimal use of hands Transfers: Able to transfer with verbal cueing and /or supervision Standing Unsupported with Eyes Closed: Unable to keep eyes closed 3 seconds but stays steady Standing Ubsupported with Feet Together: Able to place feet together independently but unable to hold for 30 seconds From Standing, Reach Forward with Outstretched Arm: Reaches forward but needs supervision From Standing Position, Pick up Object from Floor: Unable to try/needs assist to keep balance From Standing Position, Turn to Look Behind Over each Shoulder: Needs supervision when turning Turn 360 Degrees: Needs assistance while turning Standing Unsupported, Alternately Place Feet on Step/Stool: Needs assistance to keep from falling or unable to try Standing Unsupported, One Foot in Front: Loses balance while stepping or standing Standing on One Leg: Unable to try or needs assist to prevent fall Total Score: 20   See Function Navigator for Current Functional Status.   Therapy/Group: Individual Therapy  Michel Santee 10/10/2017, 10:18 AM

## 2017-10-11 ENCOUNTER — Inpatient Hospital Stay (HOSPITAL_COMMUNITY): Payer: PPO | Admitting: Occupational Therapy

## 2017-10-11 LAB — GLUCOSE, CAPILLARY
GLUCOSE-CAPILLARY: 144 mg/dL — AB (ref 65–99)
GLUCOSE-CAPILLARY: 198 mg/dL — AB (ref 65–99)
GLUCOSE-CAPILLARY: 182 mg/dL — AB (ref 65–99)
Glucose-Capillary: 140 mg/dL — ABNORMAL HIGH (ref 65–99)

## 2017-10-11 NOTE — Progress Notes (Signed)
Occupational Therapy Session Note  Patient Details  Name: [name] MRN: [id number] Date of Birth: [birth year]  Today's Date: 10/11/2017 OT Individual Time: 1300-1345 OT Individual Time Calculation (min): 45 min    Short Term Goals: Week 1:  OT Short Term Goal 1 (Week 1): Pt will complete toileting tasks with min A  OT Short Term Goal 2 (Week 1): Pt will complete shower transfer using grab bars with min A  OT Short Term Goal 3 (Week 1): Pt will tolerate standing activity for 40m with support for ADLs OT Short Term Goal 4 (Week 1): Pt will completed LB dressing using AE/AD PRN with min A  Skilled Therapeutic Interventions/Progress Updates:    Pt seen for OT ADL bathing/dressing session. Pt sitting up in w/c upon arrival, agreeableto tx session. He ambulated throughout room with RW and close supervision. Hegathered clothing items from drawer and then ambulated into bathroom. Bathed in shower seated on tub bench with supervision, standing with use of grab bars to complete pericare/ buttock hygiene. He returned to toilet to dress using AE to assist with threading of pants. VCs for proper hand placement on RW during sit <> stand.  Following seated rest break, he completed oral care standing at sink with supervision.  Pt returned to recliner. TED hose donned and LEs elevatd for edema management. Left in recliner with all needs in reach.   Therapy Documentation Precautions:  Precautions Precautions: Fall Precaution Comments: Absent sensation in B LE distal to knee; frequent faller prior to admission Restrictions Weight Bearing Restrictions: No Pain:   No/ denies pain  See Function Navigator for Current Functional Status.   Therapy/Group: Individual Therapy  Kili Gracy L 10/11/2017, 6:50 AM

## 2017-10-11 NOTE — Plan of Care (Signed)
  Progressing Consults [facility] STROKE PATIENT EDUCATION Description See Patient Education module for education specifics  10/11/2017 2115 - Progressing by Edd Arbour, RN RH BOWEL ELIMINATION RH STG MANAGE BOWEL WITH ASSISTANCE Description STG Manage Bowel with  Max Assistance.  10/11/2017 2115 - Progressing by Edd Arbour, RN RH BLADDER ELIMINATION RH STG MANAGE BLADDER WITH ASSISTANCE Description STG Manage Bladder With max Assistance  10/11/2017 2115 - Progressing by Edd Arbour, RN RH SKIN INTEGRITY RH STG SKIN FREE OF INFECTION/BREAKDOWN Description No new breakdown prior to discharge  10/11/2017 2115 - Progressing by Edd Arbour, RN RH STG MAINTAIN SKIN INTEGRITY WITH ASSISTANCE Description STG Maintain Skin Integrity With  [place].  10/11/2017 2115 - Progressing by Edd Arbour, RN RH SAFETY RH STG ADHERE TO SAFETY PRECAUTIONS W/ASSISTANCE/DEVICE Description STG Adhere to Safety Precautions With  Min Assistance/Device.  10/11/2017 2115 - Progressing by Edd Arbour, RN RH PAIN MANAGEMENT RH STG PAIN MANAGED AT OR BELOW PT'S PAIN GOAL Description Less than 3  10/11/2017 2115 - Progressing by Edd Arbour, RN

## 2017-10-11 NOTE — Plan of Care (Signed)
  Progressing Consults RH STROKE PATIENT EDUCATION Description See Patient Education module for education specifics  10/11/2017 0140 - Progressing by Cornell Barman, RN Diabetes Guidelines if Diabetic/Glucose > 140 Description If diabetic or lab glucose is > 140 mg/dl - Initiate Diabetes/Hyperglycemia Guidelines & Document Interventions  10/11/2017 0140 - Progressing by Cornell Barman, RN RH BOWEL ELIMINATION RH STG MANAGE BOWEL WITH ASSISTANCE Description STG Manage Bowel with  Max Assistance.  10/11/2017 0140 - Progressing by Cornell Barman, RN RH BLADDER ELIMINATION RH STG MANAGE BLADDER WITH ASSISTANCE Description STG Manage Bladder With max Assistance  10/11/2017 0140 - Progressing by Cornell Barman, RN RH SKIN INTEGRITY RH STG SKIN FREE OF INFECTION/BREAKDOWN Description No new breakdown prior to discharge  10/11/2017 0140 - Progressing by Cornell Barman, RN RH STG MAINTAIN SKIN INTEGRITY WITH ASSISTANCE Description STG Maintain Skin Integrity With  [place].  10/11/2017 0140 - Progressing by Cornell Barman, RN RH SAFETY RH STG ADHERE TO SAFETY PRECAUTIONS W/ASSISTANCE/DEVICE Description STG Adhere to Safety Precautions With  Min Assistance/Device.  10/11/2017 0140 - Progressing by Cornell Barman, RN RH COGNITION-NURSING RH STG USES MEMORY AIDS/STRATEGIES W/ASSIST TO PROBLEM SOLVE Description STG Uses Memory Aids/Strategies With  Min Assistance to Problem Solve.  10/11/2017 0140 - Progressing by Cornell Barman, RN RH STG ANTICIPATES NEEDS/CALLS FOR ASSIST W/ASSIST/CUES Description STG Anticipates Needs/Calls for Assist With  Mod I Assistance/Cues.  10/11/2017 0140 - Progressing by Cornell Barman, RN RH PAIN MANAGEMENT RH STG PAIN MANAGED AT OR BELOW PT'S PAIN GOAL Description Less than 3  10/11/2017 0140 - Progressing by Cornell Barman, RN RH KNOWLEDGE DEFICIT RH STG INCREASE KNOWLEDGE OF DIABETES Description Patient and family will be able to  verbalized treatment and management of diabetes prior to discharge  10/11/2017 0140 - Progressing by Cornell Barman, RN RH STG INCREASE KNOWLEDGE OF HYPERTENSION Description Patient and family will be able to verbalize management of hypertension prior to discharge  10/11/2017 0140 - Progressing by Cornell Barman, RN

## 2017-10-11 NOTE — Progress Notes (Signed)
Patient ID: [name], male   DOB: 09-12-1929, 83 y.o.   MRN: [id number]   [name] is a 83 y.o. male who was admitted for CIR following a left MCA embolic infarct with a aphasia and gait disorder  Subjective: No new complaints. No new problems.  Requesting a grounds pass  Objective: Vital signs in last 24 hours: Temp:  [98.3 F (36.8 C)] 98.3 F (36.8 C) (12/30 0421) Pulse Rate:  [64-76] 67 (12/30 0749) Resp:  [18] 18 (12/30 0421) BP: (119-143)/(40-51) 143/50 (12/30 0749) SpO2:  [96 %] 96 % (12/30 0421) Weight:  [221 lb 12.5 oz (100.6 kg)] 221 lb 12.5 oz (100.6 kg) (12/30 0421) Weight change: 7 lb 11.5 oz (3.5 kg) Last BM Date: 10/10/17  Intake/Output from previous day: 12/29 0701 - 12/30 0700 In: 834 [P.O.:834] Out: -  Last cbgs: CBG (last 3)  Recent Labs    10/10/17 1652 10/10/17 2030 10/11/17 0648  GLUCAP 208* 146* 140*   Patient Vitals for the past 24 hrs:  BP Temp Temp src Pulse Resp SpO2 Weight  10/11/17 0749 (!) 143/50 - - 67 - - -  10/11/17 0421 (!) 119/40 98.3 F (36.8 C) Oral 64 18 96 % 221 lb 12.5 oz (100.6 kg)  10/10/17 2030 (!) 141/51 - - 76 - - -    Physical Exam General: No apparent distress   HEENT: Unremarkable Lungs: Normal effort. Lungs clear to auscultation, no crackles or wheezes. Cardiovascular: Regular rate and rhythm, no edema; grade 2/6 systolic murmur; rhythm appears regular (history of CAF) Abdomen: S/NT/ND; BS(+) Musculoskeletal:  unchanged Neurological: No new neurological deficits; very minimal speech deficit Wounds: N/A    Skin: clear   Mental state: Alert, oriented, cooperative    Lab Results: BMET    Component Value Date/Time   NA 137 10/08/2017 0848   NA 141 05/15/2017 1506   K 3.5 10/08/2017 0848   CL 102 10/08/2017 0848   CO2 24 10/08/2017 0848   GLUCOSE 202 (H) 10/08/2017 0848   BUN 20 10/08/2017 0848   BUN 37 (H) 05/15/2017 1506   CREATININE 1.21 10/08/2017 0848   CREATININE 1.54 (H) 03/12/2017 1148    CALCIUM 8.8 (L) 10/08/2017 0848   GFRNONAA 52 (L) 10/08/2017 0848   GFRNONAA 41 (L) 03/12/2017 1148   GFRAA >60 10/08/2017 0848   GFRAA 47 (L) 03/12/2017 1148   CBC    Component Value Date/Time   WBC 9.9 10/08/2017 0848   RBC 3.95 (L) 10/08/2017 0848   HGB 12.6 (L) 10/08/2017 0848   HCT 38.2 (L) 10/08/2017 0848   PLT 309 10/08/2017 0848   MCV 96.7 10/08/2017 0848   MCH 31.9 10/08/2017 0848   MCHC 33.0 10/08/2017 0848   RDW 13.3 10/08/2017 0848   LYMPHSABS 1.7 10/08/2017 0848   MONOABS 1.0 10/08/2017 0848   EOSABS 0.3 10/08/2017 0848   BASOSABS 0.0 10/08/2017 0848    Medications: I have reviewed the patient's current medications.  Assessment/Plan:  Functional deficits status post left MCA infarct.  Continue CIR with OT and PT DVT prophylaxis.  Continue Eliquis anticoagulation Chronic atrial fibrillation Diabetes mellitus.  Fasting blood sugar improved today at 140 Essential hypertension stable    Length of stay, days: [place] , MD 10/11/2017, 9:46 AM

## 2017-10-11 NOTE — Progress Notes (Signed)
Paged Dr. Leanne Chang last HS for sleeping aid. Order for PRN trazodone 25mg , med given at 2106. Patient reports sleeping "some" better.[name] A

## 2017-10-12 ENCOUNTER — Inpatient Hospital Stay (HOSPITAL_COMMUNITY): Payer: PPO | Admitting: Occupational Therapy

## 2017-10-12 ENCOUNTER — Inpatient Hospital Stay (HOSPITAL_COMMUNITY): Payer: PPO | Admitting: Physical Therapy

## 2017-10-12 ENCOUNTER — Inpatient Hospital Stay (HOSPITAL_COMMUNITY): Payer: PPO | Admitting: Speech Pathology

## 2017-10-12 LAB — GLUCOSE, CAPILLARY
GLUCOSE-CAPILLARY: 155 mg/dL — AB (ref 65–99)
Glucose-Capillary: 172 mg/dL — ABNORMAL HIGH (ref 65–99)
Glucose-Capillary: 147 mg/dL — ABNORMAL HIGH (ref 65–99)
Glucose-Capillary: 170 mg/dL — ABNORMAL HIGH (ref 65–99)

## 2017-10-12 NOTE — Progress Notes (Signed)
Speech Language Pathology Daily Session Note  Patient Details  Name: [name] MRN: [id number] Date of Birth: [birth year]  Today's Date: 10/12/2017 SLP Individual Time: 1055-1150 SLP Individual Time Calculation (min): 55 min  Short Term Goals: Week 1: SLP Short Term Goal 1 (Week 1): Patient will self-monitor and correct verbal errors at the phrase level with Min A multimodal cues.  SLP Short Term Goal 2 (Week 1): Patient will perform oral reading tasks with 75% accuracy and Min A verbal cues.  SLP Short Term Goal 3 (Week 1): Patient will follow 2-step commnands with 25% accuracy and Mod A verbal cues.  SLP Short Term Goal 4 (Week 1): Patient will utilize word-finding strategies at the phrase level with Min A verbal cues.   Skilled Therapeutic Interventions: Skilled treatment session focused on communication goals. SLP facilitated session by providing Min A verbal and question cues for patient to self-monitor and correct errors and improve specificity during a verbal description task. SLP also facilitated session by providing extra time and supervision verbal cues during a generative naming task and to self-monitor and correct errors during written expression at the word level. Patient left upright in recliner with all needs within reach. Continue with current plan of care.      Function:   Cognition Comprehension Comprehension assist level: Follows basic conversation/direction with no assist  Expression   Expression assist level: Expresses basic 75 - 89% of the time/requires cueing 10 - 24% of the time. Needs helper to occlude trach/needs to repeat words.  Social Interaction Social Interaction assist level: Interacts appropriately with others with medication or extra time (anti-anxiety, antidepressant).  Problem Solving Problem solving assist level: Solves basic problems with no assist  Memory Memory assist level: More than reasonable amount of time    Pain Pain Assessment Pain  Assessment: No/denies pain  Therapy/Group: Individual Therapy  Laurene Melendrez 10/12/2017, 11:51 AM

## 2017-10-12 NOTE — Progress Notes (Signed)
Subjective/Complaints:  No issues overnite  ROS- some limitation from aphasia, see above  Objective: Vital Signs: Blood pressure (!) 156/56, pulse 69, temperature (!) 97.5 F (36.4 C), temperature source Oral, resp. rate 18, height 5\' 10"  (1.778 m), weight 97.2 kg (214 lb 4.6 oz), SpO2 100 %. No results found. Results for orders placed or performed during the hospital encounter of 10/07/17 (from the past 72 hour(s))  Glucose, capillary     Status: Abnormal   Collection Time: 10/09/17 12:00 PM  Result Value Ref Range   Glucose-Capillary 141 (H) 65 - 99 mg/dL   Comment 1 Notify RN   Glucose, capillary     Status: Abnormal   Collection Time: 10/09/17  4:48 PM  Result Value Ref Range   Glucose-Capillary 135 (H) 65 - 99 mg/dL   Comment 1 Notify RN   Glucose, capillary     Status: Abnormal   Collection Time: 10/09/17  9:47 PM  Result Value Ref Range   Glucose-Capillary 153 (H) 65 - 99 mg/dL  Glucose, capillary     Status: Abnormal   Collection Time: 10/10/17  6:42 AM  Result Value Ref Range   Glucose-Capillary 160 (H) 65 - 99 mg/dL  Glucose, capillary     Status: Abnormal   Collection Time: 10/10/17 11:40 AM  Result Value Ref Range   Glucose-Capillary 195 (H) 65 - 99 mg/dL  Glucose, capillary     Status: Abnormal   Collection Time: 10/10/17  4:52 PM  Result Value Ref Range   Glucose-Capillary 208 (H) 65 - 99 mg/dL  Glucose, capillary     Status: Abnormal   Collection Time: 10/10/17  8:30 PM  Result Value Ref Range   Glucose-Capillary 146 (H) 65 - 99 mg/dL  Glucose, capillary     Status: Abnormal   Collection Time: 10/11/17  6:48 AM  Result Value Ref Range   Glucose-Capillary 140 (H) 65 - 99 mg/dL  Glucose, capillary     Status: Abnormal   Collection Time: 10/11/17 11:30 AM  Result Value Ref Range   Glucose-Capillary 144 (H) 65 - 99 mg/dL  Glucose, capillary     Status: Abnormal   Collection Time: 10/11/17  5:08 PM  Result Value Ref Range   Glucose-Capillary 198 (H) 65  - 99 mg/dL  Glucose, capillary     Status: Abnormal   Collection Time: 10/11/17  9:12 PM  Result Value Ref Range   Glucose-Capillary 182 (H) 65 - 99 mg/dL  Glucose, capillary     Status: Abnormal   Collection Time: 10/12/17  6:37 AM  Result Value Ref Range   Glucose-Capillary 172 (H) 65 - 99 mg/dL       Physical Exam  Vitals reviewed. HENT:  Head: Normocephalic.  Eyes: EOM are normal.  Neck: Normal range of motion. Neck supple. No thyromegaly present.  Cardiovascular:  Cardiac rate controlled  Respiratory: Effort normal and breath sounds normal. No respiratory distress.  GI: Soft. Bowel sounds are normal. He exhibits no distension.  Neurological: He is alert.  Makes good eye contact with examiner. Patient with some subtle expressive receptive aphasia. He was able to state his name and age as well as follow simple commands.  Motor:  5/5 in L delt , Bi , Tri, Grip, HF, KE , ADF, 5- on left side Sensation: reduced LT below the tibial tubercle bilateral Difficult with naming spontaneously as well as visual objects such as ring, able to repeat Comprehension appears intact   Assessment/Plan: 1. Functional deficits secondary  to Left MCA infarct with gait disorder , aphasia which require 3+ hours per day of interdisciplinary therapy in a comprehensive inpatient rehab setting. Physiatrist is providing close team supervision and 24 hour management of active medical problems listed below. Physiatrist and rehab team continue to assess barriers to discharge/monitor patient progress toward functional and medical goals. FIM: Function - Bathing Position: Shower Body parts bathed by patient: Right arm, Left arm, Chest, Abdomen, Front perineal area, Left upper leg, Right upper leg, Buttocks, Left lower leg, Right lower leg, Back(LH sponge) Body parts bathed by helper: Back Assist Level: Supervision or verbal cues  Function- Upper Body Dressing/Undressing What is the patient wearing?: Pull  over shirt/dress Pull over shirt/dress - Perfomed by patient: Thread/unthread right sleeve, Thread/unthread left sleeve, Put head through opening, Pull shirt over trunk Assist Level: More than reasonable time Function - Lower Body Dressing/Undressing What is the patient wearing?: Non-skid slipper socks, Pants, Ted Hose Position: Wheelchair/chair at [employer]- Performed by patient: Thread/unthread right pants leg, Thread/unthread left pants leg, Pull pants up/down Non-skid slipper socks- Performed by patient: Don/doff right sock, Don/doff left sock Non-skid slipper socks- Performed by helper: Don/doff right sock TED Hose - Performed by helper: Don/doff left TED hose, Don/doff right TED hose Assist for footwear: Supervision/touching assist Assist for lower body dressing: Supervision or verbal cues(Using reacher)  Function - Toileting Toileting activity did not occur: Safety/medical concerns Toileting steps completed by patient: Adjust clothing prior to toileting, Performs perineal hygiene, Adjust clothing after toileting Toileting Assistive Devices: Grab bar or rail Assist level: No help/no cues  Function - Air cabin crew transfer assistive device: Pension scheme manager level to toilet: Supervision or verbal cues Assist level from toilet: Supervision or verbal cues  Function - Chair/bed transfer Chair/bed transfer method: Ambulatory Chair/bed transfer assist level: Touching or steadying assistance (Pt > 75%) Chair/bed transfer assistive device: Armrests, Walker Chair/bed transfer details: Verbal cues for technique, Verbal cues for precautions/safety, Verbal cues for safe use of DME/AE, Verbal cues for sequencing  Function - Locomotion: Wheelchair Will patient use wheelchair at discharge?: (tbd) Type: Manual Max wheelchair distance: 150 ft Assist Level: No help, No cues, assistive device, takes more than reasonable amount of time Assist Level: No help, No cues, assistive device,  takes more than reasonable amount of time Assist Level: No help, No cues, assistive device, takes more than reasonable amount of time Turns around,maneuvers to table,bed, and toilet,negotiates 3% grade,maneuvers on rugs and over doorsills: No Function - Locomotion: Ambulation Assistive device: Walker-rolling Max distance: 100 Assist level: Touching or steadying assistance (Pt > 75%) Assist level: Touching or steadying assistance (Pt > 75%) Walk 50 feet with 2 turns activity did not occur: Safety/medical concerns(Endurance) Assist level: Touching or steadying assistance (Pt > 75%) Walk 150 feet activity did not occur: Safety/medical concerns Walk 10 feet on uneven surfaces activity did not occur: Safety/medical concerns  Function - Comprehension Comprehension: Auditory Comprehension assist level: Follows basic conversation/direction with no assist  Function - Expression Expression: Verbal Expression assist level: Expresses basic 75 - 89% of the time/requires cueing 10 - 24% of the time. Needs helper to occlude trach/needs to repeat words.  Function - Social Interaction Social Interaction assist level: Interacts appropriately with others with medication or extra time (anti-anxiety, antidepressant).  Function - Problem Solving Problem solving assist level: Solves basic problems with no assist  Function - Memory Memory assist level: More than reasonable amount of time Patient normally able to recall (first 3 days only): That he or  she is in a hospital  Medical Problem List and Plan: 1.  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 CIR PT, OT,SLP 2.  DVT Prophylaxis/Anticoagulation: Eliquis 3. Pain Management: Tylenol as needed  4. Mood: Provide emotional support  5. Neuropsych: This patient is  capable of making decisions on his  own behalf. 6. Skin/Wound Care: Routine skin checks  7. Fluids/Electrolytes/Nutrition: Routine  I&O's with follow-up chemistries 8. Atrial fibrillation. Cardiac rate controlled. Continue Eliquis 9. Diabetes mellitus peripheral neuropathy. Hemoglobin A1c 7.8. SSI. Patient on Glucotrol 2.5 mg daily prior to admission. Resume when intake improves CBG running a little high CBG (last 3)  Recent Labs    10/11/17 1708 10/11/17 2112 10/12/17 0637  GLUCAP 198* 182* 172*   10. Hypertension. Permissive hypertension and monitor Lopressor 75 mg twice a day, Lasix 20 mg daily Vitals:   10/11/17 1359 10/12/17 0410  BP: (!) 129/47 (!) 156/56  Pulse: 64 69  Resp: 18 18  Temp: 98.3 F (36.8 C) (!) 97.5 F (36.4 C)  SpO2: 99% 100%  adequate control 11. Hypothyroidism. TSH 4.307. Continue Synthroid 12. Hyperlipidemia. Lipitor 13. Obesity             Body mass index is 31.1 kg/m.             Diet and exercise education             Encourage weight loss to increase endurance and promote overall health  14.  Constipation , cont senna S- LBM yesterday, pt doesn't remember LOS (Days) 5 A FACE TO FACE EVALUATION WAS PERFORMED  [name] 10/12/2017, 8:14 AM

## 2017-10-12 NOTE — IPOC Note (Addendum)
Overall Plan of Care [facility]) Patient Details Name: [name] MRN: [id number] DOB: 08/03/1929  Admitting Diagnosis: <principal problem not specified>  Hospital Problems: Active Problems:   Left middle cerebral artery stroke [facility])     Functional Problem List: Nursing Bladder, Bowel, Edema, Endurance, Medication Management, Motor, Safety, Sensory, Skin Integrity, Pain  PT Balance, Edema, Motor, Safety, Sensory  OT Balance, Perception, Safety, Sensory, Motor, Endurance  SLP    TR         Basic ADL's: OT Eating, Grooming, Bathing, Dressing, Toileting     Advanced  ADL's: OT       Transfers: PT Bed Mobility, Bed to Chair, Car, Chief Operating Officer: PT Ambulation, Emergency planning/management officer, Stairs     Additional Impairments: OT    SLP        TR      Anticipated Outcomes Item Anticipated Outcome  Self Feeding mod I   Swallowing      Basic self-care  S for memory  Toileting  S for memory   Bathroom Transfers S for memory  Bowel/Bladder  managed max assist   Transfers  Supervision to min A  Locomotion  Supervision to min A  Communication  Supervision   Cognition     Pain  less than 3  on 0-10 scale  Safety/Judgment  min assist    Therapy Plan: PT Intensity: Minimum of 1-2 x/day ,45 to 90 minutes PT Frequency: 5 out of 7 days PT Duration Estimated Length of Stay: 18 to 21 days OT Intensity: Minimum of 1-2 x/day, 45 to 90 minutes OT Frequency: 5 out of 7 days OT Duration/Estimated Length of Stay: 14-17 days SLP Intensity: Minumum of 1-2 x/day, 30 to 90 minutes SLP Frequency: 3 to 5 out of 7 days SLP Duration/Estimated Length of Stay: 18-21 days    Team Interventions: Nursing Interventions Patient/Family Education, Bladder Management, Bowel Management, Disease Management/Prevention, Pain Management, Medication Management, Skin Care/Wound Management, Cognitive Remediation/Compensation  PT interventions Ambulation/gait training, Cognitive  remediation/compensation, Training and development officer, Community reintegration, Disease management/prevention, Neuromuscular re-education, Barrister's clerk education, IT trainer, Therapeutic Exercise, UE/LE Coordination activities, Wheelchair propulsion/positioning, UE/LE Strength taining/ROM, Therapeutic Activities, Psychosocial support, Pain management, Functional mobility training, DME/adaptive equipment instruction, Discharge planning  OT Interventions Balance/vestibular training, Discharge planning, Self Care/advanced ADL retraining, Therapeutic Activities, UE/LE Coordination activities, Patient/family education, Therapeutic Exercise, Functional mobility training, DME/adaptive equipment instruction  SLP Interventions Cueing hierarchy, Environmental controls, Patient/family education, Therapeutic Activities, Functional tasks, Multimodal communication approach  TR Interventions    SW/CM Interventions Discharge Planning, Psychosocial Support, Patient/Family Education   Barriers to Discharge MD  fall risk  Nursing Incontinence    PT Decreased caregiver support, Other (comments)(Cognition(memory))    OT Decreased caregiver support decreased ability to complete ADLs due to weakness   SLP      SW       Team Discharge Planning: Destination: PT-(Depends on wifes ability to provide care) ,OT- Home , SLP-Home Projected Follow-up: PT-Home health PT, OT-  24 hour supervision/assistance, SLP-24 hour supervision/assistance, Outpatient SLP Projected Equipment Needs: PT-To be determined, OT- To be determined, SLP-None recommended by SLP Equipment Details: PT- , OT-reacher Patient/family involved in discharge planning: PT- Patient,  OT-Patient, SLP-Patient  MD ELOS: 10-14d Medical Rehab Prognosis:  Excellent Assessment:  83 y.o.right handed malewith history of diabetes mellituswith peripheral neuropathy, hypothyroidism, atrial fibrillation maintained on aspirin and multiple falls. Presented  10/02/2017 with aphasia. Per chart review patient lives with spouse independent prior to [place] still driving.Wife can  assist.Cranial CT scan reviewed, unremarkable for acute process.  Patient did receive TPA. CT angiogram head and neck with no flow reducing intracranial stenosis. 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 Echocardiogram with ejection fraction of 65%. Systolic function was normal. EEG negative for seizure. Carotid Dopplers in no ICA stenosis. Currently maintained on Eliquis for CVA prophylaxis   Now requiring 24/7 Rehab RN,MD, as well as CIR level PT, OT and SLP.  Treatment team will focus on ADLs and mobility with goals set at Sup   See Team Conference Notes for weekly updates to the plan of care

## 2017-10-12 NOTE — Progress Notes (Addendum)
Occupational Therapy Session Note  Patient Details  Name: [name] MRN: [id number] Date of Birth: 31-Dec-1928  Today's Date: 10/12/2017 OT Individual Time: 1300-1345 OT Individual Time Calculation (min): 45 min   Short Term Goals: Week 1:  OT Short Term Goal 1 (Week 1): Pt will complete toileting tasks with min A  OT Short Term Goal 2 (Week 1): Pt will complete shower transfer using grab bars with min A  OT Short Term Goal 3 (Week 1): Pt will tolerate standing activity for 74m with support for ADLs OT Short Term Goal 4 (Week 1): Pt will completed LB dressing using AE/AD PRN with min A  Skilled Therapeutic Interventions/Progress Updates:    Pt greeted in recliner, requesting to shower. He ambulated with RW and supervision to gather necessary items in room, then proceeded to TTB. Pt doffing shirt while standing, and required cues to sit to doff LB garments. He bathed at sit<stand level with close supervision and use of LH sponge as needed. Combined dual task processing/word finding challenges while conversing with OT about past hobbies and family during shower. Afterwards pt ambulated short distance to toilet and dressed at sit<stand level with supervision (without AE). Assist provided for Teds. Per pt, spouse can assist him with Teds at d/c. Oral care and shaving completed while standing at sink for improving balance and standing tolerance. At end of session pt was left in recliner with all needs within reach.  Therapy Documentation Precautions:  Precautions Precautions: Fall Precaution Comments: Absent sensation in B LE distal to knee; frequent faller prior to admission Restrictions Weight Bearing Restrictions: No Vital Signs: Therapy Vitals Temp: 98.4 F (36.9 C) Temp Source: Oral Pulse Rate: 66 Resp: 20 BP: (!) 118/48 Patient Position (if appropriate): Sitting Oxygen Therapy SpO2: 100 % O2 Device: Not Delivered Pain: No c/o pain during tx    See Function Navigator for  Current Functional Status.   Therapy/Group: Individual Therapy  Argie Applegate A Shirelle Tootle 10/12/2017, 3:11 PM

## 2017-10-12 NOTE — Progress Notes (Signed)
Physical Therapy Session Note  Patient Details  Name: [name] MRN: [id number] Date of Birth: [birth year]  Today's Date: 10/12/2017 PT Individual Time: 1415-1515 PT Individual Time Calculation (min): 60 min   Short Term Goals: Week 1:  PT Short Term Goal 1 (Week 1): Pt will perform sit to stand from w/c to walker with safe technique with min A for 3/3 reps PT Short Term Goal 2 (Week 1): Pt will perform supine to sit and return with min A PT Short Term Goal 3 (Week 1): Pt will ambulate 100 ft with min A and least restrictive assistive device PT Short Term Goal 4 (Week 1): Pt and wife will be issued written handouts on fall risk reduction in the home environment.  Skilled Therapeutic Interventions/Progress Updates:    no c/o pain.  Session focus on activity tolerance, NMR, and problem solving.   Pt ambulates to and from therapy gym with Rw with distant supervision.  Transfers throughout session with supervision, occasional cue to reach back for sitting surface.    Nustep x8 minutes at level 4 for strengthening, reciprocal stepping pattern retraining, neutral LE rotation, and activity tolerance. Pt reporting discomfort in L knee following nustep.  PT provided distraction and grade 1 AP mobilizations to L knee for decrease in pain with (+) results.    NMR via 15 reps heel slides, clam shells, and bridges with adductor hold.    Pt completed dog-pile puzzle (level 1) with mod cues to complete.    Returned to room at end of session, positioned upright in recliner with call bell in reach and needs met.   Therapy Documentation Precautions:  Precautions Precautions: Fall Precaution Comments: Absent sensation in B LE distal to knee; frequent faller prior to admission Restrictions Weight Bearing Restrictions: No See Function Navigator for Current Functional Status.   Therapy/Group: Individual Therapy  Michel Santee 10/12/2017, 3:17 PM

## 2017-10-12 NOTE — Progress Notes (Signed)
Physical Therapy Session Note  Patient Details  Name: [name] MRN: [id number] Date of Birth: [birth year]  Today's Date: 10/12/2017 PT Individual Time: 0900-1000 PT Individual Time Calculation (min): 60 min   Short Term Goals: Week 1:  PT Short Term Goal 1 (Week 1): Pt will perform sit to stand from w/c to walker with safe technique with min A for 3/3 reps PT Short Term Goal 2 (Week 1): Pt will perform supine to sit and return with min A PT Short Term Goal 3 (Week 1): Pt will ambulate 100 ft with min A and least restrictive assistive device PT Short Term Goal 4 (Week 1): Pt and wife will be issued written handouts on fall risk reduction in the home environment.  Skilled Therapeutic Interventions/Progress Updates:    no c/o pain.  Session focus on high level ambulation and falls recovery education.   Pt ambulates within room with supervision and RW.  Toileting with distant supervision.   Gait training to and from therapy gym with RW and distant supervision.  High level gait training through obstacle course x2 focus on compliant/uneven surfaces, and stepping around/over obstacles.  Pt completes obstacle course with supervision.  Stair negotiation 3x4 steps with 2 rails and supervision, pt self selected alternating pattern to ascend and step-to pattern to descend.    PT instructed pt in falls recovery and pt completed floor transfer with steady assist to come to floor and mod assist to return to therapy mat.  Pt returned to room at end of session, requested to change shirt, and does so in standing with supervision for balance.  Positioned in recliner with call bell in reach and needs met.   Therapy Documentation Precautions:  Precautions Precautions: Fall Precaution Comments: Absent sensation in B LE distal to knee; frequent faller prior to admission Restrictions Weight Bearing Restrictions: No   See Function Navigator for Current Functional Status.   Therapy/Group:  Individual Therapy  Michel Santee 10/12/2017, 10:00 AM

## 2017-10-12 NOTE — Progress Notes (Signed)
Social Work Patient ID: [name], male   DOB: 1929-03-19, 83 y.o.   MRN: [id number]   CSW received phone call from pt's son, Cornellius Kropp, asking questions about team conference and f/u care, etc.  This CSW called him back on behalf of pt's CSW, [place], LCSW.  Explained to son that team conference to be held on Wednesday, 10-14-17 and that Jacqlyn Larsen would either update pt/wife in the room or call wife if she was not present to update her.  Also explained that [facility] is covered by medicare, but that Medicare does not cover someone to provide 24/7 supervision.  Pt needs min A for stairs, but shouldn't need other hands on care and son feels his mother (pt's wife) could provide supervision and they can help with stairs and respite for wife.  Gave son number for Dr. Clydene Fake office to make f/u appt.    Son was appreciative of information.  Pt has a tub shower at home and CSW let OT know this.  CSW told son this CSW remains available to assist as needed and Jacqlyn Larsen to return 10-14-17.

## 2017-10-13 ENCOUNTER — Inpatient Hospital Stay (HOSPITAL_COMMUNITY): Payer: PPO | Admitting: Physical Therapy

## 2017-10-13 ENCOUNTER — Inpatient Hospital Stay (HOSPITAL_COMMUNITY): Payer: PPO

## 2017-10-13 ENCOUNTER — Inpatient Hospital Stay (HOSPITAL_COMMUNITY): Payer: PPO | Admitting: Occupational Therapy

## 2017-10-13 LAB — GLUCOSE, CAPILLARY
GLUCOSE-CAPILLARY: 135 mg/dL — AB (ref 65–99)
GLUCOSE-CAPILLARY: 136 mg/dL — AB (ref 65–99)
GLUCOSE-CAPILLARY: 192 mg/dL — AB (ref 65–99)
Glucose-Capillary: 151 mg/dL — ABNORMAL HIGH (ref 65–99)

## 2017-10-13 MED ORDER — DICLOFENAC SODIUM 1 % TD GEL
2.0000 g | Freq: Four times a day (QID) | TRANSDERMAL | Status: DC
  Administered 2017-10-13 – 2017-10-16 (×12): 2 g via TOPICAL
  Filled 2017-10-13: qty 100

## 2017-10-13 NOTE — Progress Notes (Addendum)
Subjective/Complaints:  No issues overnite  ROS- some limitation from aphasia, see above  Objective: Vital Signs: Blood pressure (!) 140/49, pulse 70, temperature 98.3 F (36.8 C), temperature source Oral, resp. rate 18, height 5\' 10"  (1.778 m), weight 98.9 kg (218 lb 0.6 oz), SpO2 95 %. No results found. Results for orders placed or performed during the hospital encounter of 10/07/17 (from the past 72 hour(s))  Glucose, capillary     Status: Abnormal   Collection Time: 10/10/17 11:40 AM  Result Value Ref Range   Glucose-Capillary 195 (H) 65 - 99 mg/dL  Glucose, capillary     Status: Abnormal   Collection Time: 10/10/17  4:52 PM  Result Value Ref Range   Glucose-Capillary 208 (H) 65 - 99 mg/dL  Glucose, capillary     Status: Abnormal   Collection Time: 10/10/17  8:30 PM  Result Value Ref Range   Glucose-Capillary 146 (H) 65 - 99 mg/dL  Glucose, capillary     Status: Abnormal   Collection Time: 10/11/17  6:48 AM  Result Value Ref Range   Glucose-Capillary 140 (H) 65 - 99 mg/dL  Glucose, capillary     Status: Abnormal   Collection Time: 10/11/17 11:30 AM  Result Value Ref Range   Glucose-Capillary 144 (H) 65 - 99 mg/dL  Glucose, capillary     Status: Abnormal   Collection Time: 10/11/17  5:08 PM  Result Value Ref Range   Glucose-Capillary 198 (H) 65 - 99 mg/dL  Glucose, capillary     Status: Abnormal   Collection Time: 10/11/17  9:12 PM  Result Value Ref Range   Glucose-Capillary 182 (H) 65 - 99 mg/dL  Glucose, capillary     Status: Abnormal   Collection Time: 10/12/17  6:37 AM  Result Value Ref Range   Glucose-Capillary 172 (H) 65 - 99 mg/dL  Glucose, capillary     Status: Abnormal   Collection Time: 10/12/17 11:48 AM  Result Value Ref Range   Glucose-Capillary 155 (H) 65 - 99 mg/dL  Glucose, capillary     Status: Abnormal   Collection Time: 10/12/17  4:41 PM  Result Value Ref Range   Glucose-Capillary 147 (H) 65 - 99 mg/dL  Glucose, capillary     Status: Abnormal    Collection Time: 10/12/17  8:59 PM  Result Value Ref Range   Glucose-Capillary 170 (H) 65 - 99 mg/dL   Comment 1 Notify RN   Glucose, capillary     Status: Abnormal   Collection Time: 10/13/17  6:43 AM  Result Value Ref Range   Glucose-Capillary 151 (H) 65 - 99 mg/dL   Comment 1 Notify RN        Physical Exam  Vitals reviewed. HENT:  Head: Normocephalic.  Eyes: EOM are normal.  Neck: Normal range of motion. Neck supple. No thyromegaly present.  Cardiovascular:  Cardiac rate controlled  Respiratory: Effort normal and breath sounds normal. No respiratory distress.  GI: Soft. Bowel sounds are normal. He exhibits no distension.  Neurological: He is alert.  Makes good eye contact with examiner. Patient with some subtle expressive receptive aphasia. He was able to state his name and age as well as follow simple commands.  Motor:  5/5 in L delt , Bi , Tri, Grip, HF, KE , ADF, 5- on left side Sensation: reduced LT below the tibial tubercle bilateral Difficult with naming spontaneously as well as visual objects such as ring, able to repeat Comprehension appears intact  FROM Left knee no effusion , mild  varus deformity Assessment/Plan: 1. Functional deficits secondary to Left MCA infarct with gait disorder , aphasia which require 3+ hours per day of interdisciplinary therapy in a comprehensive inpatient rehab setting. Physiatrist is providing close team supervision and 24 hour management of active medical problems listed below. Physiatrist and rehab team continue to assess barriers to discharge/monitor patient progress toward functional and medical goals. FIM: Function - Bathing Position: Shower Body parts bathed by patient: Right arm, Left arm, Chest, Abdomen, Front perineal area, Left upper leg, Right upper leg, Buttocks, Left lower leg, Right lower leg, Back Body parts bathed by helper: Back Assist Level: Supervision or verbal cues  Function- Upper Body Dressing/Undressing What  is the patient wearing?: Pull over shirt/dress Pull over shirt/dress - Perfomed by patient: Thread/unthread right sleeve, Thread/unthread left sleeve, Put head through opening, Pull shirt over trunk Assist Level: Set up Function - Lower Body Dressing/Undressing What is the patient wearing?: Non-skid slipper socks, Pants, Ted Hose Position: Other (comment)(sitting on toilet) Pants- Performed by patient: Thread/unthread right pants leg, Thread/unthread left pants leg, Pull pants up/down Non-skid slipper socks- Performed by patient: Don/doff right sock, Don/doff left sock Non-skid slipper socks- Performed by helper: Don/doff right sock TED Hose - Performed by helper: Don/doff left TED hose, Don/doff right TED hose Assist for footwear: Supervision/touching assist Assist for lower body dressing: Supervision or verbal cues  Function - Toileting Toileting steps completed by patient: Adjust clothing prior to toileting, Performs perineal hygiene, Adjust clothing after toileting Toileting steps completed by helper: Performs perineal hygiene, Adjust clothing after toileting(per report) Toileting Assistive Devices: Grab bar or rail Assist level: No help/no cues  Function - Air cabin crew transfer assistive device: Environmental consultant, Grab bar Assist level to toilet: Supervision or verbal cues Assist level from toilet: Supervision or verbal cues  Function - Chair/bed transfer Chair/bed transfer method: Ambulatory Chair/bed transfer assist level: Supervision or verbal cues Chair/bed transfer assistive device: Armrests, Walker Chair/bed transfer details: Verbal cues for technique, Verbal cues for precautions/safety, Verbal cues for safe use of DME/AE, Verbal cues for sequencing  Function - Locomotion: Wheelchair Will patient use wheelchair at discharge?: No Type: Manual Max wheelchair distance: 150 ft Assist Level: No help, No cues, assistive device, takes more than reasonable amount of time Assist  Level: No help, No cues, assistive device, takes more than reasonable amount of time Assist Level: No help, No cues, assistive device, takes more than reasonable amount of time Turns around,maneuvers to table,bed, and toilet,negotiates 3% grade,maneuvers on rugs and over doorsills: No Function - Locomotion: Ambulation Assistive device: Walker-rolling Max distance: 200 Assist level: Supervision or verbal cues Assist level: Supervision or verbal cues Walk 50 feet with 2 turns activity did not occur: Safety/medical concerns(Endurance) Assist level: Supervision or verbal cues Walk 150 feet activity did not occur: Safety/medical concerns Assist level: Supervision or verbal cues Walk 10 feet on uneven surfaces activity did not occur: Safety/medical concerns Assist level: Supervision or verbal cues  Function - Comprehension Comprehension: Auditory Comprehension assist level: Follows basic conversation/direction with no assist  Function - Expression Expression: Verbal Expression assist level: Expresses basic 75 - 89% of the time/requires cueing 10 - 24% of the time. Needs helper to occlude trach/needs to repeat words.  Function - Social Interaction Social Interaction assist level: Interacts appropriately with others with medication or extra time (anti-anxiety, antidepressant).  Function - Problem Solving Problem solving assist level: Solves basic problems with no assist  Function - Memory Memory assist level: More than reasonable amount of time  Patient normally able to recall (first 3 days only): Staff names and faces, That he or she is in a hospital, Location of own room, Current season  Medical Problem List and Plan: 1.  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 CIR PT, OT,SLP, team conf in am 2.  DVT Prophylaxis/Anticoagulation: Eliquis 3. Pain Management: Tylenol as needed , voltaren gel L Knee QID 4. Mood: Provide emotional  support  5. Neuropsych: This patient is  capable of making decisions on his  own behalf. 6. Skin/Wound Care: Routine skin checks  7. Fluids/Electrolytes/Nutrition: Routine I&O's with follow-up chemistries 8. Atrial fibrillation. Cardiac rate controlled. Continue Eliquis 9. Diabetes mellitus peripheral neuropathy. Hemoglobin A1c 7.8. SSI. Patient on Glucotrol 2.5 mg daily prior to admission. Goal during inpt is <180 CBG (last 3)  Recent Labs    10/12/17 1641 10/12/17 2059 10/13/17 0643  GLUCAP 147* 170* 151*   10. Hypertension. Permissive hypertension and monitor Lopressor 75 mg twice a day, Lasix 20 mg daily Vitals:   10/12/17 1411 10/13/17 0414  BP: (!) 118/48 (!) 140/49  Pulse: 66 70  Resp: 20 18  Temp: 98.4 F (36.9 C) 98.3 F (36.8 C)  SpO2: 100% 95%  adequate control 11. Hypothyroidism. TSH 4.307. Continue Synthroid 12. Hyperlipidemia. Lipitor 13. Obesity             Body mass index is 31.1 kg/m.             Diet and exercise education             Encourage weight loss to increase endurance and promote overall health  14.  Constipation , cont senna S- LBM 1/1 continent LOS (Days) 6 A FACE TO FACE EVALUATION WAS PERFORMED  [name] 10/13/2017, 11:00 AM

## 2017-10-13 NOTE — Progress Notes (Signed)
Speech Language Pathology Daily Session Note  Patient Details  Name: [name] MRN: [id number] Date of Birth: [birth year]  Today's Date: 10/13/2017 SLP Individual Time: 1300-1330 SLP Individual Time Calculation (min): 30 min  Short Term Goals: Week 1: SLP Short Term Goal 1 (Week 1): Patient will self-monitor and correct verbal errors at the phrase level with Min A multimodal cues.  SLP Short Term Goal 2 (Week 1): Patient will perform oral reading tasks with 75% accuracy and Min A verbal cues.  SLP Short Term Goal 3 (Week 1): Patient will follow 2-step commnands with 25% accuracy and Mod A verbal cues.  SLP Short Term Goal 4 (Week 1): Patient will utilize word-finding strategies at the phrase level with Min A verbal cues.   Skilled Therapeutic Interventions: Skilled ST services focused on cognitive skills. SLP facilitated word finding skills in naming simple opposites requiring min A verbal cues and naming item when given definition requiring Mod A verbal cues, however when pt read definition aloud required Min A verbal cues. SLP facilitated pt reading at phrase level requiring mid A verbal cues for decoding, min A verbal cues to monitor errors and mod A verbal cues to correct verbal errors. Pt ws left in room with call bell within reach. Reccomend to continue skilled ST services.      Function:  Eating Eating   Modified Consistency Diet: No Eating Assist Level: Set up assist for   Eating Set Up Assist For: Opening containers       Cognition Comprehension Comprehension assist level: Follows basic conversation/direction with no assist  Expression   Expression assist level: Expresses basic 75 - 89% of the time/requires cueing 10 - 24% of the time. Needs helper to occlude trach/needs to repeat words.  Social Interaction Social Interaction assist level: Interacts appropriately with others with medication or extra time (anti-anxiety, antidepressant).  Problem Solving Problem solving  assist level: Solves basic problems with no assist  Memory Memory assist level: More than reasonable amount of time    Pain Pain Assessment Pain Assessment: No/denies pain  Therapy/Group: Individual Therapy  [name]  [facility] 10/13/2017, 5:44 PM

## 2017-10-13 NOTE — Progress Notes (Signed)
Occupational Therapy Session Note  Patient Details  Name: [name] MRN: [id number] Date of Birth: 1929-08-15  Today's Date: 10/13/2017 OT Individual Time: 1105-1200 OT Individual Time Calculation (min): 55 min    Short Term Goals: Week 1:  OT Short Term Goal 1 (Week 1): Pt will complete toileting tasks with min A  OT Short Term Goal 2 (Week 1): Pt will complete shower transfer using grab bars with min A  OT Short Term Goal 3 (Week 1): Pt will tolerate standing activity for 26mwith support for ADLs OT Short Term Goal 4 (Week 1): Pt will completed LB dressing using AE/AD PRN with min A  Skilled Therapeutic Interventions/Progress Updates:    pt seen for BADL training to include toilet and shower stall transfers, shower and dressing.  Overall with use of the RW, pt was able to ambulate in room and complete all self care (except donning TED hose) with S.  He continues to have edema in both feet.  He also had some word finding difficulties.  He stood at sink to groom with S.  He does need verbal cues to widen his foot stance prior to standing as he tends to pull L foot into midline and will try to push up to standing with weight on the outside of his foot.   Once self care was complete, he worked on general strengthening with UE AROM exercises of simulated rope climb, rope pull and then 10 reps of chair push ups for 2 sets. Pt in recliner in room with all needs met.   Therapy Documentation Precautions:  Precautions Precautions: Fall Precaution Comments: Absent sensation in B LE distal to knee; frequent faller prior to admission Restrictions Weight Bearing Restrictions: No  Pain: Pain Assessment Pain Assessment: 0-10 Pain Score: 4  Pain Type: Acute pain Pain Location: Knee Pain Orientation: Left Pain Descriptors / Indicators: Aching Pain Onset: Gradual Pain Intervention(s): Medication (See eMAR) ADL:   See Function Navigator for Current Functional Status.   Therapy/Group:  Individual Therapy  SAGUIER,JULIA 10/13/2017, 12:29 PM

## 2017-10-13 NOTE — Progress Notes (Signed)
Physical Therapy Session Note  Patient Details  Name: [name] MRN: [id number] Date of Birth: [birth year]  Today's Date: 10/13/2017 PT Individual Time: 1510-1555 PT Individual Time Calculation (min): 45 min   Short Term Goals: Week 1:  PT Short Term Goal 1 (Week 1): Pt will perform sit to stand from w/c to walker with safe technique with min A for 3/3 reps PT Short Term Goal 2 (Week 1): Pt will perform supine to sit and return with min A PT Short Term Goal 3 (Week 1): Pt will ambulate 100 ft with min A and least restrictive assistive device PT Short Term Goal 4 (Week 1): Pt and wife will be issued written handouts on fall risk reduction in the home environment.  Skilled Therapeutic Interventions/Progress Updates:    no c/o pain.  Session focus on activity tolerance, gait training, stair negotiation, and strengthening.  Wife present for session and PT provided education to her throughout session regarding pt CLOF and progression towards PT goals.    Pt transfers sit<>stand and ambulates throughout unit with RW and supervision.  PT instructed pt in gait x200' with rollator with supervision.  Pt reports he prefers RW due to speed of rollator.  Stair negotiation 2x4 steps with 2 rails and supervision, pt self selected alternating pattern to ascend and step-to pattern to descend.  Pt completes 2x4 steps with BUE support on L ascending rail with initial min guard, fade to close supervision on second trial.  Dicussed home entry with wife, and eventual benefit of bilateral hand rails, but not essential for discharge.  Also discussed anticipated update to LOS with pt and wife, to be estimated closer to 10 days, rather than 18-21.  Pt and wife in agreement.    Returned to room at end of session and positioned in recliner with call bell in reach and needs met.   Therapy Documentation Precautions:  Precautions Precautions: Fall Precaution Comments: Absent sensation in B LE distal to knee; frequent  faller prior to admission Restrictions Weight Bearing Restrictions: No   See Function Navigator for Current Functional Status.   Therapy/Group: Individual Therapy  Michel Santee 10/13/2017, 4:06 PM

## 2017-10-13 NOTE — Progress Notes (Signed)
Physical Therapy Session Note  Patient Details  Name: AASHIR UMHOLTZ MRN: [id number] Date of Birth: 18-Feb-1929  Today's Date: 10/13/2017 PT Individual Time: 0900-1000 PT Individual Time Calculation (min): 60 min   Short Term Goals: Week 1:  PT Short Term Goal 1 (Week 1): Pt will perform sit to stand from w/c to walker with safe technique with min A for 3/3 reps PT Short Term Goal 2 (Week 1): Pt will perform supine to sit and return with min A PT Short Term Goal 3 (Week 1): Pt will ambulate 100 ft with min A and least restrictive assistive device PT Short Term Goal 4 (Week 1): Pt and wife will be issued written handouts on fall risk reduction in the home environment.  Skilled Therapeutic Interventions/Progress Updates:    c/o pain 5/10 in L knee 2/2 OA.  PT provided ace wrap for compression and RN provided medication.  Session focus on balance, strength, and posture for carryover to functional mobility.    Pt transfers throughout session with RW and mod I.  Gait throughout unit and within room with distant supervision.    PT instructs pt in 3x8 reps of alternating toe taps to 4" step with min assist and cues for upright posture.  3x8 reps R/L trunk rotation with 2.2# ball for postural control and core strengthening with cues for upright posture.  Pt performs x10 reps minisquats and heel/toe raises in standing with mod verbal cues for correct form.    Pt returned to room at end of session and positioned upright in recliner with call bell in reach and needs met.   Therapy Documentation Precautions:  Precautions Precautions: Fall Precaution Comments: Absent sensation in B LE distal to knee; frequent faller prior to admission Restrictions Weight Bearing Restrictions: No   See Function Navigator for Current Functional Status.   Therapy/Group: Individual Therapy  Michel Santee 10/13/2017, 10:06 AM

## 2017-10-14 ENCOUNTER — Inpatient Hospital Stay (HOSPITAL_COMMUNITY): Payer: PPO | Admitting: Occupational Therapy

## 2017-10-14 ENCOUNTER — Inpatient Hospital Stay (HOSPITAL_COMMUNITY): Payer: PPO | Admitting: Speech Pathology

## 2017-10-14 ENCOUNTER — Encounter (HOSPITAL_COMMUNITY): Payer: PPO | Admitting: Psychology

## 2017-10-14 ENCOUNTER — Inpatient Hospital Stay (HOSPITAL_COMMUNITY): Payer: PPO | Admitting: Physical Therapy

## 2017-10-14 LAB — GLUCOSE, CAPILLARY
GLUCOSE-CAPILLARY: 165 mg/dL — AB (ref 65–99)
GLUCOSE-CAPILLARY: 156 mg/dL — AB (ref 65–99)
GLUCOSE-CAPILLARY: 180 mg/dL — AB (ref 65–99)
Glucose-Capillary: 185 mg/dL — ABNORMAL HIGH (ref 65–99)

## 2017-10-14 MED ORDER — SENNOSIDES-DOCUSATE SODIUM 8.6-50 MG PO TABS
3.0000 | ORAL_TABLET | Freq: Two times a day (BID) | ORAL | Status: DC
  Administered 2017-10-15 – 2017-10-16 (×2): 3 via ORAL
  Filled 2017-10-14 (×4): qty 3

## 2017-10-14 NOTE — Patient Care Conference (Signed)
Inpatient RehabilitationTeam Conference and Plan of Care Update Date: 10/14/2017   Time: 11:25 AM    Patient Name: [name]      Medical Record Number: [id number]  Date of Birth: 08/18/1929 Sex: Male         Room/Bed: 4W21C/4W21C-01 Payor Info: Payor: [employer] / Plan: [employer] Must / Product Type: *No Product type* /    Admitting Diagnosis: CVA  Admit Date/Time:  10/07/2017  4:53 PM Admission Comments: No comment available   Primary Diagnosis:  <principal problem not specified> Principal Problem: <principal problem not specified>  Patient Active Problem List   Diagnosis Date Noted  . 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 Aphasia   . PAF (paroxysmal atrial fibrillation) ([place])   . Diabetes mellitus type 2 in obese ([place])   . Benign essential HTN   . Hypothyroidism   . Hyperlipidemia   . Stroke (cerebrum) ([place]) 10/02/2017  . Afib ([place]) 01/10/2017  . SOB (shortness of breath) 01/10/2017  . Acute diastolic CHF (congestive heart failure) ([place]) 01/10/2017  . Atrial fibrillation with RVR ([place]) 01/10/2017  . Claudication ([place]) 07/25/2016  . Paroxysmal atrial fibrillation ([place]) 07/25/2016  . Odynophagia 04/10/2014  . Allergic rhinitis 03/23/2014  . Chronic rhinitis 12/22/2013  . 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 Constipation 08/22/2011  . Obesity 09/20/2009  . G E R D 11/29/2007  . Celiac disease 11/29/2007    Expected Discharge Date: Expected Discharge Date: 10/16/17  Team Members Present: Physician leading conference: Dr. Alysia Penna Social Worker Present: Ovidio Kin, LCSW Nurse Present: Frances Maywood, RN PT Present: Barrie Folk, PT OT Present: Cherylynn Ridges, OT SLP Present: Weston Anna, SLP PPS Coordinator present : Daiva Nakayama, RN, CRRN     Current Status/Progress Goal Weekly Team Focus  Medical   aphasia clearing, very little R HP  achieve Mod I, LTG return to work  D/C planning   Bowel/Bladder   Continent of B&B.   Maintain continence.  Continue to assess for toileting needs.   Swallow/Nutrition/ Hydration             ADL's   close S with use of RW  Supervision overall  family/ pt education, introduction of higher level ADLs   Mobility   supervision overall  supervision overll  d/c planning, balance   Communication   Supervision-Min A   Min A  self-monitoring nad correcting errors, use of word-finiding strategies   Safety/Cognition/ Behavioral Observations            Pain   Mild to moderate knee pain controled with Voltaren.  <3.  Assess q shift and PRN for pain and pain needs.   Skin   Small healing red area on head of penis and a carbuncle on back of neck.  Healing with no further complications to skin.  Assess skin q shift and PRN.      *See Care Plan and progress notes for long and short-term goals.     Barriers to Discharge  Current Status/Progress Possible Resolutions Date Resolved   Physician    Medical stability  working on BPs  progressing toward goals  cont rehab, D/C planning      Nursing                  PT                    OT  SLP                SW                Discharge Planning/Teaching Needs:    Home with wife who can only provide supervision level due to small stature. Will have come in prior to discharge home     Team Discussion:  Goals supervision-mod/i level. Doing well with balance and steadiness on his feet. Almost at goals level. Will need family education prior to discharge Friday. MD feels he is medically stable for discharge. Team recommends OP therapies.  Revisions to Treatment Plan:  DC 1/4    Continued Need for Acute Rehabilitation Level of Care: The patient requires daily medical management by a physician with specialized training in physical medicine and rehabilitation for the following conditions: Daily direction of a multidisciplinary physical rehabilitation program to ensure safe treatment while eliciting the highest  outcome that is of practical value to the patient.: Yes Daily medical management of patient stability for increased activity during participation in an intensive rehabilitation regime.: Yes Daily analysis of laboratory values and/or radiology reports with any subsequent need for medication adjustment of medical intervention for : Neurological problems;Blood pressure problems  Akshat Minehart, Gardiner Rhyme 10/14/2017, 11:47 AM

## 2017-10-14 NOTE — Progress Notes (Signed)
Occupational Therapy Session Note  Patient Details  Name: [name] MRN: [id number] Date of Birth: 1929/04/23  Today's Date: 10/14/2017  Session 1 OT Individual Time: 1000-1100 OT Individual Time Calculation (min): 60 min   Session 2 OT Individual Time: 1300-1345 OT Individual Time Calculation (min): 45 min    Short Term Goals: Week 1:  OT Short Term Goal 1 (Week 1): Pt will complete toileting tasks with min A  OT Short Term Goal 2 (Week 1): Pt will complete shower transfer using grab bars with min A  OT Short Term Goal 3 (Week 1): Pt will tolerate standing activity for 67mwith support for ADLs OT Short Term Goal 4 (Week 1): Pt will completed LB dressing using AE/AD PRN with min A  Skilled Therapeutic Interventions/Progress Updates:  Session 1   OT treatment session focused on modified bathing/dressing. Sit<>stand from recliner with supervision. Pt ambulated to dresser and was educated on RW positioning to safely acces drawers as well as use of walker bag to transport clothing. Pt ambualted to bathroom and transferred onto tub bench with supervision. Bathing completed with overall supervision for balance when standing to wash buttocks. Pt ambulated out of shower with min cues for safe use of RW and dressed from recliner level with increased time and supervision. Educated pt on adapted strategy to don Ted hose using friction reducing device with pt needing min A to don TEDs. Pt able to achieve figure 4 position to then don socks. Addressed standing balance/endurance with standing grooming task of shaving, bushing teeth, and washing hands. Pt tolerated 10 mins standing with supevision and no overt LOB. Pt reported need for bathroom and ambulated into bathroom, transferred on/off toilet, and completed 3/3 toileting steps without assist.   Session 2 OT treatment session focused on dc planning, functional transfers, and UB there-ex. Pt ambulated to therapy apartment with RW and supervision.  OT demonstrated tub shower transfer using tub bench in simulated home environment. Pt able to complete transfer with supervision and increased time to lift LLE over ledge. Dicussed home bathroom set-up and safe toilet transfers at night including use of urinal as pt sleeps in recliner and is a further distance from the bathroom. Pt completed 12 mins on Sci-[employer] arm bike on level 1.5-then level 2, with three 2 minute rest breaks. Worked on standing balance/endurance with reaching activity while standing on foam cushioin. Pt ambulated back to room in similar fashion as above and left seated in recliner with needs met.   Therapy Documentation Precautions:  Precautions Precautions: Fall Precaution Comments: Absent sensation in B LE distal to knee; frequent faller prior to admission Restrictions Weight Bearing Restrictions: No Pain:  none/denies pain  See Function Navigator for Current Functional Status.   Therapy/Group: Individual Therapy  [name]1/11/2017, 1:46 PM

## 2017-10-14 NOTE — Progress Notes (Signed)
Subjective/Complaints:  Slept ok, just finished PT.  No foot drop noted, walked well on uneven surfaces with a walker  ROS- some limitation from aphasia but denies CP, SOB, N/V/D  Objective: Vital Signs: Blood pressure (!) 152/65, pulse 64, temperature 98 F (36.7 C), temperature source Oral, resp. rate 18, height 5' 10" (1.778 m), weight 97.4 kg (214 lb 11.7 oz), SpO2 98 %. No results found. Results for orders placed or performed during the hospital encounter of 10/07/17 (from the past 72 hour(s))  Glucose, capillary     Status: Abnormal   Collection Time: 10/11/17 11:30 AM  Result Value Ref Range   Glucose-Capillary 144 (H) 65 - 99 mg/dL  Glucose, capillary     Status: Abnormal   Collection Time: 10/11/17  5:08 PM  Result Value Ref Range   Glucose-Capillary 198 (H) 65 - 99 mg/dL  Glucose, capillary     Status: Abnormal   Collection Time: 10/11/17  9:12 PM  Result Value Ref Range   Glucose-Capillary 182 (H) 65 - 99 mg/dL  Glucose, capillary     Status: Abnormal   Collection Time: 10/12/17  6:37 AM  Result Value Ref Range   Glucose-Capillary 172 (H) 65 - 99 mg/dL  Glucose, capillary     Status: Abnormal   Collection Time: 10/12/17 11:48 AM  Result Value Ref Range   Glucose-Capillary 155 (H) 65 - 99 mg/dL  Glucose, capillary     Status: Abnormal   Collection Time: 10/12/17  4:41 PM  Result Value Ref Range   Glucose-Capillary 147 (H) 65 - 99 mg/dL  Glucose, capillary     Status: Abnormal   Collection Time: 10/12/17  8:59 PM  Result Value Ref Range   Glucose-Capillary 170 (H) 65 - 99 mg/dL   Comment 1 Notify RN   Glucose, capillary     Status: Abnormal   Collection Time: 10/13/17  6:43 AM  Result Value Ref Range   Glucose-Capillary 151 (H) 65 - 99 mg/dL   Comment 1 Notify RN   Glucose, capillary     Status: Abnormal   Collection Time: 10/13/17 12:05 PM  Result Value Ref Range   Glucose-Capillary 135 (H) 65 - 99 mg/dL  Glucose, capillary     Status: Abnormal   Collection Time: 10/13/17  4:40 PM  Result Value Ref Range   Glucose-Capillary 136 (H) 65 - 99 mg/dL  Glucose, capillary     Status: Abnormal   Collection Time: 10/13/17  9:08 PM  Result Value Ref Range   Glucose-Capillary 192 (H) 65 - 99 mg/dL  Glucose, capillary     Status: Abnormal   Collection Time: 10/14/17  6:39 AM  Result Value Ref Range   Glucose-Capillary 165 (H) 65 - 99 mg/dL       Physical Exam  Vitals reviewed. HENT:  Head: Normocephalic.  Eyes: EOM are normal.  Neck: Normal range of motion. Neck supple. No thyromegaly present.  Cardiovascular:  Cardiac rate controlled  Respiratory: Effort normal and breath sounds normal. No respiratory distress.  GI: Soft. Bowel sounds are normal. He exhibits no distension.  Neurological: He is alert.  Makes good eye contact with examiner. Patient with some subtle expressive receptive aphasia. He was able to state his name and age as well as follow simple commands.  Motor:  5/5 in L delt , Bi , Tri, Grip, HF, KE , ADF, 5- on left side Sensation: reduced LT below the tibial tubercle bilateral Difficult with naming spontaneously as well as visual objects  such as ring, able to repeat Comprehension appears intact  FROM Left knee no effusion , mild varus deformity Assessment/Plan: 1. Functional deficits secondary to Left MCA infarct with gait disorder , aphasia which require 3+ hours per day of interdisciplinary therapy in a comprehensive inpatient rehab setting. Physiatrist is providing close team supervision and 24 hour management of active medical problems listed below. Physiatrist and rehab team continue to assess barriers to discharge/monitor patient progress toward functional and medical goals. FIM: Function - Bathing Position: Shower Body parts bathed by patient: Right arm, Left arm, Chest, Abdomen, Front perineal area, Left upper leg, Right upper leg, Buttocks, Left lower leg, Right lower leg, Back Body parts bathed by helper:  Back Assist Level: Supervision or verbal cues  Function- Upper Body Dressing/Undressing What is the patient wearing?: Pull over shirt/dress Pull over shirt/dress - Perfomed by patient: Thread/unthread right sleeve, Thread/unthread left sleeve, Put head through opening, Pull shirt over trunk Assist Level: Set up Function - Lower Body Dressing/Undressing What is the patient wearing?: Non-skid slipper socks, Pants, Ted Hose Position: Wheelchair/chair at [employer]- Performed by patient: Thread/unthread right pants leg, Thread/unthread left pants leg, Pull pants up/down Non-skid slipper socks- Performed by patient: Don/doff right sock, Don/doff left sock Non-skid slipper socks- Performed by helper: Don/doff right sock TED Hose - Performed by helper: Don/doff left TED hose, Don/doff right TED hose Assist for footwear: Supervision/touching assist Assist for lower body dressing: Supervision or verbal cues  Function - Toileting Toileting steps completed by patient: Adjust clothing prior to toileting, Performs perineal hygiene, Adjust clothing after toileting Toileting steps completed by helper: Performs perineal hygiene, Adjust clothing after toileting(per report) Toileting Assistive Devices: Grab bar or rail Assist level: No help/no cues  Function - Air cabin crew transfer assistive device: Environmental consultant, Grab bar Assist level to toilet: Supervision or verbal cues Assist level from toilet: Supervision or verbal cues  Function - Chair/bed transfer Chair/bed transfer method: Ambulatory Chair/bed transfer assist level: Supervision or verbal cues Chair/bed transfer assistive device: Armrests, Walker Chair/bed transfer details: Verbal cues for technique, Verbal cues for precautions/safety, Verbal cues for safe use of DME/AE, Verbal cues for sequencing  Function - Locomotion: Wheelchair Will patient use wheelchair at discharge?: No Type: Manual Max wheelchair distance: 150 ft Assist Level:  No help, No cues, assistive device, takes more than reasonable amount of time Assist Level: No help, No cues, assistive device, takes more than reasonable amount of time Assist Level: No help, No cues, assistive device, takes more than reasonable amount of time Turns around,maneuvers to table,bed, and toilet,negotiates 3% grade,maneuvers on rugs and over doorsills: No Function - Locomotion: Ambulation Assistive device: Walker-rolling Max distance: 200 Assist level: Supervision or verbal cues Assist level: Supervision or verbal cues Walk 50 feet with 2 turns activity did not occur: Safety/medical concerns(Endurance) Assist level: Supervision or verbal cues Walk 150 feet activity did not occur: Safety/medical concerns Assist level: Supervision or verbal cues Walk 10 feet on uneven surfaces activity did not occur: Safety/medical concerns Assist level: Supervision or verbal cues  Function - Comprehension Comprehension: Auditory Comprehension assist level: Follows basic conversation/direction with no assist  Function - Expression Expression: Verbal Expression assist level: Expresses basic 75 - 89% of the time/requires cueing 10 - 24% of the time. Needs helper to occlude trach/needs to repeat words.  Function - Social Interaction Social Interaction assist level: Interacts appropriately with others with medication or extra time (anti-anxiety, antidepressant).  Function - Problem Solving Problem solving assist level: Solves basic problems  with no assist  Function - Memory Memory assist level: More than reasonable amount of time Patient normally able to recall (first 3 days only): Staff names and faces, That he or she is in a hospital, Location of own room, Current season  Medical Problem List and Plan: 1.  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 CIR PT, OT,SLP, Team conference today please see physician documentation under team  conference tab, met with team face-to-face to discuss problems,progress, and goals. Formulized individual treatment plan based on medical history, underlying problem and comorbidities. 2.  DVT Prophylaxis/Anticoagulation: Eliquis 3. Pain Management: Tylenol as needed , voltaren gel L Knee QID 4. Mood: Provide emotional support  5. Neuropsych: This patient is  capable of making decisions on his  own behalf. 6. Skin/Wound Care: Routine skin checks  7. Fluids/Electrolytes/Nutrition: Routine I&O's with follow-up chemistries 8. Atrial fibrillation. Cardiac rate controlled. Continue Eliquis 9. Diabetes mellitus peripheral neuropathy. Hemoglobin A1c 7.8. SSI. Patient on Glucotrol 2.5 mg daily prior to admission. Goal during inpt is <180, generally in range 1/2 CBG (last 3)  Recent Labs    10/13/17 1640 10/13/17 2108 10/14/17 0639  GLUCAP 136* 192* 165*   10. Hypertension. Permissive hypertension and monitor Lopressor 75 mg twice a day, Lasix 20 mg daily Vitals:   10/13/17 1442 10/14/17 0118  BP: (!) 130/50 (!) 152/65  Pulse: 60 64  Resp: 18 18  Temp: 98.5 F (36.9 C) 98 F (36.7 C)  SpO2: 99% 98%  adequate 1/2 11. Hypothyroidism. TSH 4.307. Continue Synthroid 12. Hyperlipidemia. Lipitor 13. Obesity             Body mass index is 31.1 kg/m.             Diet and exercise education             Encourage weight loss to increase endurance and promote overall health  14.  Constipation , cont senna S- LBM 1/1 continent but c/o pushing a lot, will increase senna S to 3 tabs LOS (Days) 7 A FACE TO FACE EVALUATION WAS PERFORMED  [name] 10/14/2017, 8:56 AM

## 2017-10-14 NOTE — Progress Notes (Signed)
Physical Therapy Session Note  Patient Details  Name: [name] MRN: [id number] Date of Birth: November 02, 1928  Today's Date: 10/14/2017 PT Individual Time: 0800-0859 PT Individual Time Calculation (min): 59 min   Short Term Goals: Week 1:  PT Short Term Goal 1 (Week 1): Pt will perform sit to stand from w/c to walker with safe technique with min A for 3/3 reps PT Short Term Goal 2 (Week 1): Pt will perform supine to sit and return with min A PT Short Term Goal 3 (Week 1): Pt will ambulate 100 ft with min A and least restrictive assistive device PT Short Term Goal 4 (Week 1): Pt and wife will be issued written handouts on fall risk reduction in the home environment.  Skilled Therapeutic Interventions/Progress Updates:   Pt received supine in bed and agreeable to PT. Supine>sit transfer without  Assist, but HOB elevated.     Gait training through hall of hospital with RW x 286f and supervision assist.  PT instructed pt in dynamic gait training to weave through 8 cones and over 2 1inch obstacles x 2 with RW and supervision assist from PT. Dynamic balance training on airex pad to complete 2 moderate difficulty pipe tree puzzles. Min assist to prevent posterior LOB. PT instructed pt in Modified [place] exercise program. LAQ, Hip abduction, HS curls, mini squats sit<>stand. All completed x 10 with supervision assist from PT and BUE support. Patient returned to room and left sitting in recliner with call bell in reach and all needs met.      Therapy Documentation Precautions:  Precautions Precautions: Fall Precaution Comments: Absent sensation in B LE distal to knee; frequent faller prior to admission Restrictions Weight Bearing Restrictions: No Pain: 0/10  See Function Navigator for Current Functional Status.   Therapy/Group: Individual Therapy  ALorie Phenix1/11/2017, 8:57 AM

## 2017-10-14 NOTE — Consult Note (Signed)
Neuropsychological Consultation   Patient:   [name]   DOB:   [birth year]  MR Number:  [id number]  Location:  [facility] [id number] [place] [address] Dept: [place]: [phone]           Date of Service:   10/14/2017  Start Time:   2 PM End Time:   3 PM  Provider/Observer:  Ilean Skill, Psy.D.       Clinical Neuropsychologist       Billing Code/Service: [phone] Units  Chief Complaint:    [name] is an 84 year old male with history of diabetes mellituswith peripheral neuropathy, hypothyroidism, atrial fibrillation maintained on aspirin and multiple falls.  Patient presented 10/02/2017 with aphasia.  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  Patient has continued with aphasia symptoms of verbal fluency issues and word finding issues but is continuing to display improvements with therapy.  Patient is adjusting well and coping as his confusion immediately following stroke has improved.   Reason for Service:  [name] was referred for neuropsychological consultation due to coping issues with sudden change in expressive language following left MCA infarct.  Below is the HPI for the current admission.  HPI: Calvin Rood Deckeris a 84 y.o.right handed malewith history of diabetes mellituswith peripheral neuropathy, hypothyroidism, atrial fibrillation maintained on aspirin and multiple falls. Presented 10/02/2017 with aphasia. Per chart review patient lives with spouse independent prior to [place] still driving.Wife can assist.Cranial CT scan reviewed, unremarkable for acute process.  Patient did receive TPA. CT angiogram head and neck with no flow reducing intracranial stenosis. 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 Echocardiogram with ejection fraction of 65%. Systolic function was normal. EEG negative for seizure. Carotid Dopplers in no ICA stenosis.  Currently maintained on Eliquis for CVA prophylaxis. Regular consistency diet. Physical and occupational therapy evaluations completed 10/04/2017 with recommendations of physical medicine rehabilitation consult. Patient was admitted for a comprehensive rehabilitation program.  Current Status:  The patient's current mental status appears to be good.  Did not assess visual spatial abilities but this is likely remained at baseline.  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  He reports improved coping over past day or two and we worked on further his coping and adjustment.    Behavioral Observation: [name]  presents as a 84 y.o.-year-old Right Caucasian Male who appeared his stated age. his dress was Appropriate and he was Well Groomed and his manners were Appropriate to the situation.  his participation was indicative of Appropriate and Attentive behaviors.  There were not any physical disabilities noted.  he displayed an appropriate level of cooperation and motivation.     Interactions:    Active Appropriate and Attentive  Attention:   within normal limits and attention span and concentration were age appropriate  Memory:   within normal limits; recent and remote memory intact  Visuo-spatial:  not examined  Speech (Volume):  low  Speech:   non-fluent aphasia; word finding and fluency issues, but improving.  Some circumlocutions noted.    Thought Process:  Coherent and Relevant  Though Content:  WNL; not suicidal  Orientation:   person, place, time/date and situation  Judgment:   Good  Planning:   Good  Affect:    Appropriate  Mood:    Anxious  Insight:   Good  Intelligence:   normal  Medical History:   Past Medical History:  Diagnosis Date  . Allergic rhinitis   . Asthmatic bronchitis   . Atrial fibrillation ([place])   . Celiac disease   . Cellulitis   . Diverticulosis   . DM (diabetes  mellitus) ([place])   . Fx. left wrist 1987  . Gastric polyps   . GERD (gastroesophageal reflux disease)   . History of pneumonia    w/pleural effusion  . Hypothyroidism   . Iron deficiency anemia   . Melanoma ([place])   . Neuropathy   . Prostate cancer ([place])   . REM sleep behavior disorder   . RLS (restless legs syndrome)   . Sleep apnea    Family Med/Psych History:  Family History  Problem Relation Age of Onset  . Breast cancer Mother   . Kidney failure Father   . Heart disease Father   . Rheumatic fever Father   . Colon cancer Unknown     Risk of Suicide/Violence: virtually non-existent   Impression/DX:  [name] is an 84 year old male with history of diabetes mellituswith peripheral neuropathy, hypothyroidism, atrial fibrillation maintained on aspirin and multiple falls.  Patient presented 10/02/2017 with aphasia.  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  Patient has continued with aphasia symptoms of verbal fluency issues and word finding issues but is continuing to display improvements with therapy.  Patient is adjusting well and coping as his confusion immediately following stroke has improved.   The patient's current mental status appears to be good.  Did not assess visual spatial abilities but this is likely remained at baseline.  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  He reports improved coping over past day or two and we worked on further his coping and adjustment.  Diagnosis:    Left middle cerebral artery stroke ([place]) - Plan: Ambulatory referral to Physical Medicine Rehab  Wound abscess - Plan: doxycycline (VIBRA-TABS) tablet 100 mg  Abdominal distension - Plan: DG Abd 1 View, DG Abd 1 View         Electronically Signed   _______________________ Ilean Skill, Psy.D.

## 2017-10-14 NOTE — Progress Notes (Signed)
Speech Language Pathology Daily Session Note  Patient Details  Name: [name] MRN: [id number] Date of Birth: Sep 17, 1929  Today's Date: 10/14/2017 SLP Individual Time: 1130-1205 SLP Individual Time Calculation (min): 35 min  Short Term Goals: Week 1: SLP Short Term Goal 1 (Week 1): Patient will self-monitor and correct verbal errors at the phrase level with Min A multimodal cues.  SLP Short Term Goal 2 (Week 1): Patient will perform oral reading tasks with 75% accuracy and Min A verbal cues.  SLP Short Term Goal 3 (Week 1): Patient will follow 2-step commnands with 25% accuracy and Mod A verbal cues.  SLP Short Term Goal 4 (Week 1): Patient will utilize word-finding strategies at the phrase level with Min A verbal cues.   Skilled Therapeutic Interventions: Skilled treatment session focused on communication goals and family education. SLP facilitated session by providing Min A verbal and phonemic cues for word-finding during a functional naming task. Patient could verbally compare/contrast 2 items with Mod I but required Mod-Max A verbal and visual cues to self-monitor and correct errors at the phrase level during written expression task. Patient's family present (wife, daughter and granddaughter) and educated in regards to patient's current functional communication, current deficits and strategies to utilize at home to maximize word-finding and overall language function. All verbalized understanding and asked appropriate questions. Patient left upright in recliner with family present. Continue with current plan of care.       Function:   Cognition Comprehension Comprehension assist level: Follows basic conversation/direction with no assist  Expression   Expression assist level: Expresses basic 75 - 89% of the time/requires cueing 10 - 24% of the time. Needs helper to occlude trach/needs to repeat words.  Social Interaction Social Interaction assist level: Interacts appropriately with  others with medication or extra time (anti-anxiety, antidepressant).  Problem Solving Problem solving assist level: Solves basic problems with no assist  Memory Memory assist level: More than reasonable amount of time    Pain No/Denies Pain   Therapy/Group: Individual Therapy  Davius Goudeau 10/14/2017, 12:06 PM

## 2017-10-14 NOTE — Progress Notes (Signed)
Social Work Patient ID: [name], male   DOB: 1928-12-09, 84 y.o.   MRN: [id number]  Met with pt, wife, daughter in-law and granddaughter to discuss team conference goals supervison level and discharge 1/4. All thought this was soon. Pt will be at supervision level. Discussed equipment needs and follow up needs. Pt has been to [facility] before and would like to go back there. Will make referral for OP and all agreeable to equipment needs. Work tosard discharge on Friday. Encouraged them to stay and attend therapies with pt today.

## 2017-10-15 ENCOUNTER — Inpatient Hospital Stay (HOSPITAL_COMMUNITY): Payer: PPO | Admitting: Occupational Therapy

## 2017-10-15 ENCOUNTER — Inpatient Hospital Stay (HOSPITAL_COMMUNITY): Payer: PPO | Admitting: Physical Therapy

## 2017-10-15 ENCOUNTER — Inpatient Hospital Stay (HOSPITAL_COMMUNITY): Payer: PPO | Admitting: Speech Pathology

## 2017-10-15 LAB — GLUCOSE, CAPILLARY
GLUCOSE-CAPILLARY: 173 mg/dL — AB (ref 65–99)
Glucose-Capillary: 143 mg/dL — ABNORMAL HIGH (ref 65–99)
Glucose-Capillary: 149 mg/dL — ABNORMAL HIGH (ref 65–99)
Glucose-Capillary: 140 mg/dL — ABNORMAL HIGH (ref 65–99)

## 2017-10-15 NOTE — Progress Notes (Signed)
Subjective/Complaints:  Patient without new complaints today aware of discharge date 10/16/2017  ROS- some limitation from aphasia but denies CP, SOB, N/V/D  Objective: Vital Signs: Blood pressure (!) 160/59, pulse 66, temperature 97.9 F (36.6 C), temperature source Oral, resp. rate 16, height 5\' 10"  (1.778 m), weight 97.3 kg (214 lb 8.6 oz), SpO2 98 %. No results found. Results for orders placed or performed during the hospital encounter of 10/07/17 (from the past 72 hour(s))  Glucose, capillary     Status: Abnormal   Collection Time: 10/12/17 11:48 AM  Result Value Ref Range   Glucose-Capillary 155 (H) 65 - 99 mg/dL  Glucose, capillary     Status: Abnormal   Collection Time: 10/12/17  4:41 PM  Result Value Ref Range   Glucose-Capillary 147 (H) 65 - 99 mg/dL  Glucose, capillary     Status: Abnormal   Collection Time: 10/12/17  8:59 PM  Result Value Ref Range   Glucose-Capillary 170 (H) 65 - 99 mg/dL   Comment 1 Notify RN   Glucose, capillary     Status: Abnormal   Collection Time: 10/13/17  6:43 AM  Result Value Ref Range   Glucose-Capillary 151 (H) 65 - 99 mg/dL   Comment 1 Notify RN   Glucose, capillary     Status: Abnormal   Collection Time: 10/13/17 12:05 PM  Result Value Ref Range   Glucose-Capillary 135 (H) 65 - 99 mg/dL  Glucose, capillary     Status: Abnormal   Collection Time: 10/13/17  4:40 PM  Result Value Ref Range   Glucose-Capillary 136 (H) 65 - 99 mg/dL  Glucose, capillary     Status: Abnormal   Collection Time: 10/13/17  9:08 PM  Result Value Ref Range   Glucose-Capillary 192 (H) 65 - 99 mg/dL  Glucose, capillary     Status: Abnormal   Collection Time: 10/14/17  6:39 AM  Result Value Ref Range   Glucose-Capillary 165 (H) 65 - 99 mg/dL  Glucose, capillary     Status: Abnormal   Collection Time: 10/14/17 11:50 AM  Result Value Ref Range   Glucose-Capillary 156 (H) 65 - 99 mg/dL  Glucose, capillary     Status: Abnormal   Collection Time: 10/14/17   4:25 PM  Result Value Ref Range   Glucose-Capillary 180 (H) 65 - 99 mg/dL  Glucose, capillary     Status: Abnormal   Collection Time: 10/14/17  9:26 PM  Result Value Ref Range   Glucose-Capillary 185 (H) 65 - 99 mg/dL   Comment 1 Notify RN   Glucose, capillary     Status: Abnormal   Collection Time: 10/15/17  6:38 AM  Result Value Ref Range   Glucose-Capillary 143 (H) 65 - 99 mg/dL       Physical Exam  Vitals reviewed. HENT:  Head: Normocephalic.  Eyes: EOM are normal.  Neck: Normal range of motion. Neck supple. No thyromegaly present.  Cardiovascular:  Cardiac rate controlled  Respiratory: Effort normal and breath sounds normal. No respiratory distress.  GI: Soft. Bowel sounds are normal. He exhibits no distension.  Neurological: He is alert.  Makes good eye contact with examiner. Patient with some subtle expressive receptive aphasia. He was able to state his name and age as well as follow simple commands.  Motor:  5/5 in L delt , Bi , Tri, Grip, HF, KE , ADF, 5- on left side Sensation: reduced LT below the tibial tubercle bilateral Difficult with naming spontaneously as well as visual objects  such as ring, able to repeat Comprehension appears intact  FROM Left knee no effusion , mild varus deformity Assessment/Plan: 1. Functional deficits secondary to Left MCA infarct with gait disorder , aphasia which require 3+ hours per day of interdisciplinary therapy in a comprehensive inpatient rehab setting. Physiatrist is providing close team supervision and 24 hour management of active medical problems listed below. Physiatrist and rehab team continue to assess barriers to discharge/monitor patient progress toward functional and medical goals. FIM: Function - Bathing Position: Shower Body parts bathed by patient: Right arm, Left arm, Chest, Abdomen, Front perineal area, Left upper leg, Right upper leg, Buttocks, Left lower leg, Right lower leg, Back Body parts bathed by helper:  Back Assist Level: Supervision or verbal cues  Function- Upper Body Dressing/Undressing What is the patient wearing?: Pull over shirt/dress Pull over shirt/dress - Perfomed by patient: Thread/unthread right sleeve, Thread/unthread left sleeve, Pull shirt over trunk, Put head through opening Assist Level: More than reasonable time Function - Lower Body Dressing/Undressing What is the patient wearing?: Pants, Underwear, Non-skid slipper socks, Ted Hose Position: Wheelchair/chair at [employer] - Performed by patient: Thread/unthread right underwear leg, Pull underwear up/down, Thread/unthread left underwear leg Pants- Performed by patient: Thread/unthread right pants leg, Thread/unthread left pants leg, Pull pants up/down Non-skid slipper socks- Performed by patient: Don/doff left sock, Don/doff right sock Non-skid slipper socks- Performed by helper: Don/doff right sock TED Hose - Performed by helper: Don/doff left TED hose, Don/doff right TED hose Assist for footwear: Supervision/touching assist Assist for lower body dressing: Supervision or verbal cues  Function - Toileting Toileting steps completed by patient: Adjust clothing prior to toileting, Performs perineal hygiene, Adjust clothing after toileting Toileting steps completed by helper: Performs perineal hygiene, Adjust clothing after toileting(per report) Toileting Assistive Devices: Grab bar or rail Assist level: No help/no cues  Function - Air cabin crew transfer assistive device: Walker, Grab bar Assist level to toilet: No Help, no cues, assistive device, takes more than a reasonable amount of time Assist level from toilet: Supervision or verbal cues  Function - Chair/bed transfer Chair/bed transfer method: Ambulatory Chair/bed transfer assist level: No Help, no cues, assistive device, takes more than a reasonable amount of time Chair/bed transfer assistive device: Armrests, Walker Chair/bed transfer details:  Verbal cues for technique, Verbal cues for precautions/safety, Verbal cues for safe use of DME/AE, Verbal cues for sequencing  Function - Locomotion: Wheelchair Will patient use wheelchair at discharge?: No Type: Manual Max wheelchair distance: 150 ft Assist Level: No help, No cues, assistive device, takes more than reasonable amount of time Assist Level: No help, No cues, assistive device, takes more than reasonable amount of time Assist Level: No help, No cues, assistive device, takes more than reasonable amount of time Turns around,maneuvers to table,bed, and toilet,negotiates 3% grade,maneuvers on rugs and over doorsills: No Function - Locomotion: Ambulation Assistive device: Walker-rolling Max distance: 247ft Assist level: Supervision or verbal cues Assist level: Supervision or verbal cues Walk 50 feet with 2 turns activity did not occur: Safety/medical concerns(Endurance) Assist level: Supervision or verbal cues Walk 150 feet activity did not occur: Safety/medical concerns Assist level: Supervision or verbal cues Walk 10 feet on uneven surfaces activity did not occur: Safety/medical concerns Assist level: Supervision or verbal cues  Function - Comprehension Comprehension: Auditory Comprehension assist level: Follows basic conversation/direction with no assist  Function - Expression Expression: Verbal Expression assist level: Expresses basic 75 - 89% of the time/requires cueing 10 - 24% of the time. Needs  helper to occlude trach/needs to repeat words.  Function - Social Interaction Social Interaction assist level: Interacts appropriately with others with medication or extra time (anti-anxiety, antidepressant).  Function - Problem Solving Problem solving assist level: Solves basic problems with no assist  Function - Memory Memory assist level: More than reasonable amount of time Patient normally able to recall (first 3 days only): Staff names and faces, That he or she is in  a hospital, Location of own room, Current season  Medical Problem List and Plan: 1.  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 CIR PT, OT,SLP, plan discharge in a.m. 2.  DVT Prophylaxis/Anticoagulation: Eliquis 3. Pain Management: Tylenol as needed , voltaren gel L Knee QID 4. Mood: Provide emotional support  5. Neuropsych: This patient is  capable of making decisions on his  own behalf. 6. Skin/Wound Care: Routine skin checks  7. Fluids/Electrolytes/Nutrition: Routine I&O's with follow-up chemistries 8. Atrial fibrillation. Cardiac rate controlled. Continue Eliquis 9. Diabetes mellitus peripheral neuropathy. Hemoglobin A1c 7.8. SSI. Patient on Glucotrol 2.5 mg daily prior to admission. Goal during inpt is <180, generally in range 1/3 CBG (last 3)  Recent Labs    10/14/17 1625 10/14/17 2126 10/15/17 0638  GLUCAP 180* 185* 143*   10. Hypertension. Permissive hypertension and monitor Lopressor 75 mg twice a day, Lasix 20 mg daily Vitals:   10/14/17 1429 10/15/17 0323  BP: (!) 160/56 (!) 160/59  Pulse: 74 66  Resp: 18 16  Temp: 98.2 F (36.8 C) 97.9 F (36.6 C)  SpO2: 98% 98%  Follow-up with PCP as outpatient 11. Hypothyroidism. TSH 4.307. Continue Synthroid 12. Hyperlipidemia. Lipitor 13. Obesity             Body mass index is 31.1 kg/m.             Diet and exercise education             Encourage weight loss to increase endurance and promote overall health  14.  Constipation , cont senna S- LBM 1/1 continent but c/o pushing a lot, will increase senna S to 3 tabs LOS (Days) 8 A FACE TO FACE EVALUATION WAS PERFORMED  [name] 10/15/2017, 10:42 AM

## 2017-10-15 NOTE — Discharge Summary (Signed)
Discharge summary job [id number]

## 2017-10-15 NOTE — Progress Notes (Signed)
Occupational Therapy Discharge Summary  Patient Details  Name: [name] MRN: [id number] Date of Birth: Feb 28, 1929  Today's Date: 10/15/2017 OT Individual Time: 7510-2585 OT Individual Time Calculation (min): 70 min    OT treatment session focused on increased independence with BADL tasks. Pt able to access dresser drawers, collect clothing, ambulate to bathroom with RW all without assist from OT. Bathing/dressing completed mod I with increased time. See functional navigator for further details. Pt then ambulated to therapy gym and completed UB there-ex using 1 lb weighted bar 3 sets of 10 reps of straight arm raises and bicep curl. Pt returned to room in similar fashion and left seated in recliner with needs met.   Patient has met 10 of 10 long term goals due to improved activity tolerance, improved balance, postural control, ability to compensate for deficits, improved attention, improved awareness and improved coordination.  Patient to discharge at overall Modified Independent /supervision level.  Patient's care partner is independent to provide the necessary physical assistance at discharge for higher level iADLs.   Reasons goals not met: n/a  Recommendation:  Patient will benefit from ongoing skilled OT services in outpatient setting to continue to advance functional skills in the area of BADL.  Equipment: tub transfer bench, RW, 3-in-1 BSC  Reasons for discharge: treatment goals met and discharge from hospital  Patient/family agrees with progress made and goals achieved: Yes  OT Discharge Precautions/Restrictions  Precautions Precautions: Fall Precaution Comments: absent sensation and proprioception in BLEs distal to the knee Restrictions Weight Bearing Restrictions: No Pain Pain Assessment Pain Assessment: No/denies pain ADL ADL Equipment Provided: Reacher, Long-handled sponge Eating: Independent Where Assessed-Eating: Wheelchair Grooming: Independent Where  Assessed-Grooming: Standing at sink Upper Body Bathing: Independent Where Assessed-Upper Body Bathing: Shower Lower Body Bathing: Modified independent Where Assessed-Lower Body Bathing: Shower Upper Body Dressing: Independent Where Assessed-Upper Body Dressing: Edge of bed Lower Body Dressing: Modified independent Where Assessed-Lower Body Dressing: Edge of bed Toileting: Modified independent Where Assessed-Toileting: Glass blower/designer: Diplomatic Services operational officer Method: Counselling psychologist: Energy manager: Distant supervision Tub/Shower Transfer Method: Optometrist: Facilities manager: Environmental education officer Method: Heritage manager: Grab bars, Shower seat with back ADL Comments: Plesae see functional navigator Vision Baseline Vision/History: No visual deficits Wears Glasses: At all times Patient Visual Report: No change from baseline Perception  Perception: Within Functional Limits Praxis Praxis: Intact Cognition Overall Cognitive Status: Within Functional Limits for tasks assessed Arousal/Alertness: Awake/alert Orientation Level: Oriented X4 Attention: Selective Selective Attention: Appears intact Memory: Appears intact Awareness: Appears intact Problem Solving: Appears intact Safety/Judgment: Appears intact Sensation Sensation Light Touch: Impaired Detail Light Touch Impaired Details: Impaired LLE;Impaired RLE(baseline peripheral neuropathy ) Proprioception: Impaired by gross assessment Coordination Gross Motor Movements are Fluid and Coordinated: Yes Fine Motor Movements are Fluid and Coordinated: Yes Coordination and Movement Description: [facility] for ADLs and transfers  Finger Nose Finger Test: [facility] R=L Motor  Motor Motor: Within Functional Limits Mobility  Bed Mobility Bed Mobility: Supine to Sit;Sit to Supine Supine to Sit: 6:  Modified independent (Device/Increase time) Sit to Supine: 6: Modified independent (Device/Increase time) Transfers Sit to Stand: 6: Modified independent (Device/Increase time) Stand to Sit: 6: Modified independent (Device/Increase time)  Trunk/Postural Assessment  Cervical Assessment Cervical Assessment: Exceptions to WFL(forward head) Thoracic Assessment Thoracic Assessment: Exceptions to WFL(rounded shoulders, mild kyphosis) Lumbar Assessment Lumbar Assessment: Exceptions to WFL(posterior pelvic tilt) Postural Control Postural Control: Within Functional Limits  Balance Balance  Balance Assessed: Yes Standardized Balance Assessment Standardized Balance Assessment: Berg Balance Test Berg Balance Test Sit to Stand: Able to stand  independently using hands Standing Unsupported: Able to stand 2 minutes with supervision Sitting with Back Unsupported but Feet Supported on Floor or Stool: Able to sit safely and securely 2 minutes Stand to Sit: Sits safely with minimal use of hands Transfers: Able to transfer safely, definite need of hands Standing Unsupported with Eyes Closed: Able to stand 10 seconds with supervision Standing Ubsupported with Feet Together: Able to place feet together independently but unable to hold for 30 seconds From Standing, Reach Forward with Outstretched Arm: Can reach forward >5 cm safely (2") From Standing Position, Pick up Object from Floor: Able to pick up shoe, needs supervision From Standing Position, Turn to Look Behind Over each Shoulder: Needs supervision when turning Turn 360 Degrees: Needs close supervision or verbal cueing Standing Unsupported, Alternately Place Feet on Step/Stool: Needs assistance to keep from falling or unable to try Standing Unsupported, One Foot in Front: Loses balance while stepping or standing Standing on One Leg: Unable to try or needs assist to prevent fall Total Score: 29 Static Sitting Balance Static Sitting - Level of  Assistance: 6: Modified independent (Device/Increase time) Dynamic Sitting Balance Dynamic Sitting - Balance Support: During functional activity Dynamic Sitting - Level of Assistance: 6: Modified independent (Device/Increase time) Static Standing Balance Static Standing - Balance Support: During functional activity Static Standing - Level of Assistance: 6: Modified independent (Device/Increase time) Dynamic Standing Balance Dynamic Standing - Balance Support: During functional activity Dynamic Standing - Level of Assistance: 6: Modified independent (Device/Increase time) Extremity/Trunk Assessment RUE Assessment RUE Assessment: Within Functional Limits LUE Assessment LUE Assessment: Within Functional Limits   See Function Navigator for Current Functional Status.  Daneen Schick Zuri Bradway 10/15/2017, 12:27 PM

## 2017-10-15 NOTE — Discharge Summary (Signed)
NAMESHYHIEM, BEENEY NO.:  [id number]  MEDICAL RECORD NO.:  [id number]  LOCATION:  [id number]                        FACILITY:  [facility]  PHYSICIAN:  [name], M.D.DATE OF BIRTH:  [birth year]  DATE OF ADMISSION:  10/07/2017 DATE OF DISCHARGE:  10/16/2017                              DISCHARGE SUMMARY   DISCHARGE DIAGNOSES: 1. 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 2. Eliquis for deep venous thrombosis prophylaxis. 3. Pain management. 4. Atrial fibrillation. 5. Diabetes mellitus. 6. Peripheral neuropathy. 7. Hypertension. 8. Hypothyroidism. 9. Hyperlipidemia. 10.Obesity. 11.Constipation.  HISTORY OF PRESENT ILLNESS:  This is an 84 year old right-handed male with history of diabetes mellitus, atrial fibrillation, maintained on aspirin, and multiple falls.  Presented October 02, 2017, with aphasia. He lives with spouse, independent prior to admission.  Cranial CT scan unremarkable.  The patient did receive tPA.  CT angio of the head and neck with no flow reducing stenosis.  MRI showed acute left MCA territory infarction.  Echocardiogram with ejection fraction of 65%. Systolic function normal.  EEG negative for seizure.  Carotid Dopplers with no ICA stenosis.  Maintained on Eliquis for CVA prophylaxis. Tolerating a regular diet.  Physical and occupational therapy ongoing. The patient was admitted for comprehensive rehab program.  PAST MEDICAL HISTORY:  See discharge diagnoses.  SOCIAL HISTORY:  Lives with spouse.  Independent prior to admission. Functional status upon admission to rehab services was moderate assist 25 feet rolling walker, minimal assist sit to stand, mod-to-max assist activities of daily living.  PHYSICAL EXAMINATION:  VITAL SIGNS:  Blood pressure 145/54, pulse 63, temperature 98, respirations 18. GENERAL:  This was an alert male, in no acute distress. HEENT:  EOMs intact. NECK:  Supple.   Nontender.  No JVD. CARDIAC:  Rate controlled. ABDOMEN:  Soft, nontender.  Good bowel sounds. LUNGS:  Clear to auscultation without wheeze.  Mild expressive aphasia. He was alert and oriented to name only.  REHABILITATION HOSPITAL COURSE:  The patient was admitted to Inpatient Rehab Services with therapies initiated on a 3-hour daily basis, consisting of physical therapy, occupational therapy, speech therapy, and rehabilitation nursing.  The following issues were addressed during the patient's rehabilitation stay.  Pertaining to Mr. Safley left MCA distribution infarction, remained stable, maintained on Eliquis with plan followup with Neurology Services as an outpatient.  Pain management, he was using Tylenol and Voltaren gel to his left knee as needed.  Atrial fibrillation, hypertension, rates controlled.  No chest pain or shortness of breath.  He exhibited no other signs of fluid overload.  He remained on Lasix as well as Lopressor. Patient's home med Micardis remained on hold with blood pressure stable and could follow up with primary M.D.  Blood sugars overall controlled.  Hemoglobin A1c of 7.8.  The patient had been on Glucotrol prior to admission.  This could be resumed as needed. Synthroid ongoing for hypothyroidism, latest TSH level of 4.307. Lipitor for hyperlipidemia.  Body mass index of 31.1 __________.  Diet and exercise education provided.  Bouts of constipation resolved with laxative assistance.  The patient received weekly collaborative interdisciplinary team conferences to discuss estimated length of stay, family teaching,  any barriers to his discharge.  He was ambulating with a rolling walker, supervision assistance.  Instructed on dynamic gait training, weaving in and out of obstacles, up and down stairs, activities of daily living and homemaking, sit to stand from his recliner with supervision.  Ambulates to the dresser with a rolling walker.  Positioning for safety.   Ambulates to the bathroom. Transferred onto tub bench with supervision.  Bathing completed overall with supervision for balance.  He could communicate his needs.  Followup per speech therapy.  He was tolerating a regular consistency diet.  Full teaching completed and discharged home.  DISCHARGE MEDICATIONS: 1. Eliquis 5 mg p.o. b.i.d. 2. Lipitor 40 mg p.o. daily. 3. Lasix 20 mg p.o. daily. 4. Synthroid 150 mcg p.o. daily. 5. Metoprolol 75 mg p.o. b.i.d. 6. Protonix 40 mg at bedtime. 7. Senokot 1 p.o. b.i.d., hold for loose stools. 8. The patient could resume his Glucotrol 2.5 mg daily.  DIET:  His diet was diabetic diet.  FOLLOWUP:  The patient to follow up with Dr. Alysia Penna at the [facility] as directed; Dr. Antony Contras, Neurology Services, call for appointment; Dr. Allyn Kenner, medical management     Lauraine Rinne, P.A.   ______________________________ [name], M.D.    DA/MEDQ  D:  10/15/2017  T:  10/15/2017  Job:  [id number]  cc:   Dr. Allyn Kenner Pramod P. Leonie Man, MD [name], M.D.

## 2017-10-15 NOTE — Progress Notes (Signed)
Physical Therapy Discharge Summary  Patient Details  Name: [name] MRN: [id number] Date of Birth: 1929/07/05  Today's Date: 10/15/2017 PT Individual Time: 0900-1000 PT Individual Time Calculation (min): 60 min    Patient has met 10 of 10 long term goals due to improved activity tolerance, improved balance, improved postural control, increased strength, ability to compensate for deficits, improved attention and improved awareness.  Patient to discharge at an ambulatory level Supervision.   Patient's care partner present during 1 therapy session several days prior to discharge.  PT provided education at that time regarding pt's current level of function (supervision for ambulation, min guard on stairs) and expected progression for discharge at supervision level.  Also provided education to pt regarding need for caregiver to be within arms reach and use RW at all times to reduce risk of falling.    Recommendation:  Patient will benefit from ongoing skilled PT services in outpatient setting to continue to advance safe functional mobility, address ongoing impairments in balance, and minimize fall risk.  Equipment: RW  Reasons for discharge: treatment goals met  Patient/family agrees with progress made and goals achieved: Yes   Skilled PT Intervention:  No c/o pain.  Session focus on d/c assessment and pt education.  PT readministered BERG (results below) and pt improved score from 20>29 which still indicates pt is a high fall risk.  Discussed continued use of RW for all mobility and care giver supervision 24/7. Pt verbalized understanding.  Returned to room at end of session and positioned in recliner with call bell in reach and needs met.   PT Discharge Precautions/Restrictions Precautions Precautions: Fall Precaution Comments: absent sensation and proprioception in BLEs distal to the knee Restrictions Weight Bearing Restrictions: No Pain Pain Assessment Pain Assessment:  No/denies pain Vision/Perception  Perception Perception: Within Functional Limits Praxis Praxis: Intact  Cognition Overall Cognitive Status: Within Functional Limits for tasks assessed Arousal/Alertness: Awake/alert Orientation Level: Oriented X4 Attention: Selective Selective Attention: Appears intact Memory: Appears intact Awareness: Appears intact Problem Solving: Appears intact Safety/Judgment: Appears intact Sensation Sensation Light Touch: Impaired Detail Light Touch Impaired Details: Impaired LLE;Impaired RLE(baseline peripheral neuropathy below knees bilaterally) Proprioception: Impaired by gross assessment Coordination Gross Motor Movements are Fluid and Coordinated: Yes Fine Motor Movements are Fluid and Coordinated: Yes Finger Nose Finger Test: [facility] R=L Motor  Motor Motor: Within Functional Limits  Mobility Bed Mobility Supine to Sit: 6: Modified independent (Device/Increase time) Sit to Supine: 6: Modified independent (Device/Increase time) Transfers Transfers: Yes Sit to Stand: 5: Supervision Stand to Sit: 5: Supervision Stand Pivot Transfers: 5: Supervision Locomotion  Ambulation Ambulation: Yes Ambulation/Gait Assistance: 5: Supervision Ambulation Distance (Feet): 200 Feet Assistive device: Rolling walker Gait Gait: Yes Gait Pattern: Trunk flexed Stairs / Additional Locomotion Stairs: Yes Stairs Assistance: 5: Supervision Stair Management Technique: One rail Left Number of Stairs: 4 Height of Stairs: 6 Wheelchair Mobility Wheelchair Mobility: No  Trunk/Postural Assessment  Cervical Assessment Cervical Assessment: Exceptions to WFL(forward head) Thoracic Assessment Thoracic Assessment: Exceptions to WFL(rounded shoulders, mild kyphosis) Lumbar Assessment Lumbar Assessment: Exceptions to WFL(posterior pelvic tilt) Postural Control Postural Control: Within Functional Limits  Balance Balance Balance Assessed: Yes Standardized Balance  Assessment Standardized Balance Assessment: Berg Balance Test Berg Balance Test Sit to Stand: Able to stand  independently using hands Standing Unsupported: Able to stand 2 minutes with supervision Sitting with Back Unsupported but Feet Supported on Floor or Stool: Able to sit safely and securely 2 minutes Stand to Sit: Sits safely with minimal use  of hands Transfers: Able to transfer safely, definite need of hands Standing Unsupported with Eyes Closed: Able to stand 10 seconds with supervision Standing Ubsupported with Feet Together: Able to place feet together independently but unable to hold for 30 seconds From Standing, Reach Forward with Outstretched Arm: Can reach forward >5 cm safely (2") From Standing Position, Pick up Object from Floor: Able to pick up shoe, needs supervision From Standing Position, Turn to Look Behind Over each Shoulder: Needs supervision when turning Turn 360 Degrees: Needs close supervision or verbal cueing Standing Unsupported, Alternately Place Feet on Step/Stool: Needs assistance to keep from falling or unable to try Standing Unsupported, One Foot in Front: Loses balance while stepping or standing Standing on One Leg: Unable to try or needs assist to prevent fall Total Score: 29 Extremity Assessment      RLE Assessment RLE Assessment: Exceptions to [facility] RLE AROM (degrees) RLE Overall AROM Comments: WFL assessed in sitting RLE Strength Right Hip Flexion: 3+/5 Right Knee Flexion: 4+/5 Right Knee Extension: 5/5 Right Ankle Dorsiflexion: 3/5 Right Ankle Plantar Flexion: 3/5 LLE Assessment LLE Assessment: Exceptions to WFL LLE AROM (degrees) LLE Overall AROM Comments: WFL assessed in sitting LLE Strength Left Hip Flexion: 3+/5 Left Knee Flexion: 4/5 Left Knee Extension: 5/5 Left Ankle Dorsiflexion: 3/5 Left Ankle Plantar Flexion: 3/5   See Function Navigator for Current Functional Status.  Michel Santee 10/15/2017, 12:00 PM

## 2017-10-15 NOTE — Discharge Instructions (Signed)
Inpatient Rehab Discharge Instructions  NOOR VIDALES Discharge date and time: No discharge date for patient encounter.   Activities/Precautions/ Functional Status: Activity: activity as tolerated Diet: Gluten-free Wound Care: none needed Functional status:  ___ No restrictions     ___ Walk up steps independently ___ 24/7 supervision/assistance   ___ Walk up steps with assistance ___ Intermittent supervision/assistance  ___ Bathe/dress independently ___ Walk with walker     _x STROKE/TIA DISCHARGE INSTRUCTIONS SMOKING Cigarette smoking nearly doubles your risk of having a stroke & is the single most alterable risk factor  If you smoke or have smoked in the last 12 months, you are advised to quit smoking for your health.  Most of the excess cardiovascular risk related to smoking disappears within a year of stopping.  Ask you doctor about anti-smoking medications  [place] Quit Line: 1-800-QUIT NOW  Free Smoking Cessation Classes [phone]  CHOLESTEROL Know your levels; limit fat & cholesterol in your diet  Lipid Panel     Component Value Date/Time   CHOL 160 10/03/2017 0833   TRIG 164 (H) 10/03/2017 0833   HDL 37 (L) 10/03/2017 0833   CHOLHDL 4.3 10/03/2017 0833   VLDL 33 10/03/2017 0833   LDLCALC 90 10/03/2017 0833      Many patients benefit from treatment even if their cholesterol is at goal.  Goal: Total Cholesterol (CHOL) less than 160  Goal:  Triglycerides (TRIG) less than 150  Goal:  HDL greater than 40  Goal:  LDL (LDLCALC) less than 100   BLOOD PRESSURE American Stroke Association blood pressure target is less that 120/80 mm/Hg  Your discharge blood pressure is:  BP: (!) 152/65  Monitor your blood pressure  Limit your salt and alcohol intake  Many individuals will require more than one medication for high blood pressure  DIABETES (A1c is a blood sugar average for last 3 months) Goal HGBA1c is under 7% (HBGA1c is blood sugar average for last 3 months)    Diabetes:     Lab Results  Component Value Date   HGBA1C 7.8 (H) 10/03/2017     Your HGBA1c can be lowered with medications, healthy diet, and exercise.  Check your blood sugar as directed by your physician  Call your physician if you experience unexplained or low blood sugars.  PHYSICAL ACTIVITY/REHABILITATION Goal is 30 minutes at least 4 days per week  Activity: Increase activity slowly, Therapies: Physical Therapy: Home Health Return to work:   Activity decreases your risk of heart attack and stroke and makes your heart stronger.  It helps control your weight and blood pressure; helps you relax and can improve your mood.  Participate in a regular exercise program.  Talk with your doctor about the best form of exercise for you (dancing, walking, swimming, cycling).  DIET/WEIGHT Goal is to maintain a healthy weight  Your discharge diet is: Diet gluten free Room service appropriate? Yes; Fluid consistency: Thin  liquids Your height is:  Height: 5\' 10"  (177.8 cm) Your current weight is: Weight: 97.4 kg (214 lb 11.7 oz) Your Body Mass Index (BMI) is:  BMI (Calculated): 30.81  Following the type of diet specifically designed for you will help prevent another stroke.  Your goal weight range is:    Your goal Body Mass Index (BMI) is 19-24.  Healthy food habits can help reduce 3 risk factors for stroke:  High cholesterol, hypertension, and excess weight.  RESOURCES Stroke/Support Group:  Call [place] [facility]  PATIENT Stroke warning signs and symptoms How to activate emergency medical system (call 911). Medications prescribed at discharge. Need for follow-up after discharge. Personal risk factors for stroke. Pneumonia vaccine given:  Flu vaccine given:  My questions have been answered, the writing is legible, and I understand these instructions.  I will adhere to these goals & educational materials that have been provided to me  after my discharge from the hospital.   __ Bathe/dress with assistance ___ Walk Independently    ___ Shower independently ___ Walk with assistance    ___ Shower with assistance ___ No alcohol     ___ Return to work/school ________  Special Instructions:    COMMUNITY REFERRALS UPON DISCHARGE:    Outpatient: PT, OT, SP  Agency:[facility] [place] [phone]   Date of Last Service:10/16/2017  Appointment Date/Time:JAN 7 3:00-4:00-SP JAN 9 1;45-3:15 PM PT & OT  Medical Equipment/Items Ordered:ROLLING WALKER, [place]   [phone]    My questions have been answered and I understand these instructions. I will adhere to these goals and the provided educational materials after my discharge from the hospital.  Patient/Caregiver Signature _______________________________ Date __________  Clinician Signature _______________________________________ Date __________  Please bring this form and your medication list with you to all your follow-up doctor's appointments.

## 2017-10-15 NOTE — Progress Notes (Signed)
Social Work  Discharge Note  The overall goal for the admission was met for:   Discharge location: Yes-HOME WITH WIFE WHO CAN PROVIDE SUPERVISION LEVEL ONLY  Length of Stay: Yes-9 DAYS  Discharge activity level: Yes-SUPERVISION LEVEL  Home/community participation: Yes  Services provided included: MD, RD, PT, OT, SLP, RN, CM, Pharmacy, Neuropsych and SW  Financial Services: Private Insurance: [employer]  Follow-up services arranged: Outpatient: [facility] [place]-PT,OT,SP 1/7 3:15-4:00-SP 1/9 1:45-3:15, DME: [facility]-ROLLING WALKER, [facility] and Patient/Family has no preference for HH/DME agencies  Comments (or additional information):PT DID VERY WELL AND REACHED SUPERVISION LEVEL GOALS QUICKLY. FAMILY WAS HERE FOR EDUCATION AND READY TO GO HOME. GOING BACK TO OP IN [place] BEEN THERE PREVIOUSLY.  Patient/Family verbalized understanding of follow-up arrangements: Yes  Individual responsible for coordination of the follow-up plan: MARY BETH-WIFE  Confirmed correct DME delivered: Elease Hashimoto 10/15/2017    Elease Hashimoto

## 2017-10-15 NOTE — Progress Notes (Signed)
Speech Language Pathology Session Note & Discharge Summary  Patient Details  Name: [name] MRN: [id number] Date of Birth: 04/06/1929  Today's Date: 10/15/2017 SLP Individual Time: 1300-1400 SLP Individual Time Calculation (min): 60 min   Skilled Therapeutic Interventions:   Skilled treatment session focused on language goals. Patient was administered the Western Aphasia Battery-Bedside and scored a Bedside Aphasia Score of 76 indicating mild impairment and a Bedside Language Score of 72 indicating moderate impairment. Patient demonstrated the most difficulty with decoding, reading comprehension and written expression. Patient left upright in recliner with all needs within reach.   Patient has met 3 of 3 long term goals.  Patient to discharge at [facility] level.   Reasons goals not met: N/A   Clinical Impression/Discharge Summary: Patient has made excellent gains and has met 3 of 3 LTG's this admission. Currently, patient demonstrates ability to express his wants and needs at the sentence level and requires intermittent Min A verbal cues for use of word-finding strategies and to self-monitor and correct verbal errors. Patient also demonstrates improved auditory comprehension and can follow mildly complex commands/conversation with overall Min A multimodal cues. Patient demonstrates the greatest errors with written expression in regards to syntax and reading comprehension in regards to decoding. Patient and caregiver education is complete and patient will discharge home with 24 hour supervision from family. Patient would benefit from from outpatient SLP services to maximize his functional communication.   Care Partner:  Caregiver Able to Provide Assistance: Yes  Type of Caregiver Assistance: Cognitive  Recommendation:  24 hour supervision/assistance;Outpatient SLP  Rationale for SLP Follow Up: Maximize functional communication   Equipment: N/A   Reasons for discharge: Treatment  goals met;Discharged from hospital   Patient/Family Agrees with Progress Made and Goals Achieved: Yes   Function:  Cognition Comprehension Comprehension assist level: Follows basic conversation/direction with no assist  Expression   Expression assist level: Expresses basic 75 - 89% of the time/requires cueing 10 - 24% of the time. Needs helper to occlude trach/needs to repeat words.  Social Interaction Social Interaction assist level: Interacts appropriately with others with medication or extra time (anti-anxiety, antidepressant).  Problem Solving Problem solving assist level: Solves basic problems with no assist  Memory Memory assist level: More than reasonable amount of time   Artin Mceuen 10/15/2017, 3:40 PM

## 2017-10-15 NOTE — Progress Notes (Signed)
Social Work Patient ID: [name], male   DOB: [birth year], 84 y.o.   MRN: [id number]  Met with pt and spoke with son via telephone to answer his questions. He wanted to add a bedside commode to his equipment order. Have added to [facility] order. Pt set to go home tomorrow, met goals and family feels prepared.

## 2017-10-16 LAB — GLUCOSE, CAPILLARY: GLUCOSE-CAPILLARY: 152 mg/dL — AB (ref 65–99)

## 2017-10-16 MED ORDER — PANTOPRAZOLE SODIUM 40 MG PO TBEC: 40.0000 mg | DELAYED_RELEASE_TABLET | Freq: Every day | ORAL | 0 refills | Status: AC

## 2017-10-16 MED ORDER — FUROSEMIDE 20 MG PO TABS: 20.0000 mg | ORAL_TABLET | Freq: Every day | ORAL | 3 refills | Status: DC

## 2017-10-16 MED ORDER — METOPROLOL TARTRATE 75 MG PO TABS: 75.0000 mg | ORAL_TABLET | Freq: Two times a day (BID) | ORAL | 0 refills | Status: DC

## 2017-10-16 MED ORDER — LEVOTHYROXINE SODIUM 150 MCG PO TABS: 150.0000 ug | ORAL_TABLET | Freq: Every day | ORAL | 0 refills | Status: AC

## 2017-10-16 MED ORDER — DICLOFENAC SODIUM 1 % TD GEL: 2.0000 g | Freq: Four times a day (QID) | TRANSDERMAL | 0 refills | Status: AC

## 2017-10-16 MED ORDER — ATORVASTATIN CALCIUM 40 MG PO TABS: 40.0000 mg | ORAL_TABLET | Freq: Every day | ORAL | 0 refills | Status: DC

## 2017-10-16 MED ORDER — APIXABAN 5 MG PO TABS: 5.0000 mg | ORAL_TABLET | Freq: Two times a day (BID) | ORAL | 1 refills | Status: DC

## 2017-10-16 NOTE — Progress Notes (Signed)
Pt in room with family ready to go home, receiving discharge instructions.

## 2017-10-19 ENCOUNTER — Other Ambulatory Visit: Payer: Self-pay

## 2017-10-19 ENCOUNTER — Telehealth: Payer: Self-pay

## 2017-10-19 ENCOUNTER — Ambulatory Visit (HOSPITAL_COMMUNITY): Payer: PPO | Attending: Physical Medicine & Rehabilitation | Admitting: Speech Pathology

## 2017-10-19 ENCOUNTER — Encounter (HOSPITAL_COMMUNITY): Payer: Self-pay | Admitting: Speech Pathology

## 2017-10-19 DIAGNOSIS — R41841 Cognitive communication deficit: Secondary | ICD-10-CM | POA: Diagnosis not present

## 2017-10-19 DIAGNOSIS — R4701 Aphasia: Secondary | ICD-10-CM | POA: Diagnosis not present

## 2017-10-19 DIAGNOSIS — Z9181 History of falling: Secondary | ICD-10-CM | POA: Diagnosis not present

## 2017-10-19 DIAGNOSIS — I63512 Cerebral infarction due to unspecified occlusion or stenosis of left middle cerebral artery: Secondary | ICD-10-CM | POA: Diagnosis not present

## 2017-10-19 DIAGNOSIS — R2681 Unsteadiness on feet: Secondary | ICD-10-CM | POA: Diagnosis not present

## 2017-10-19 DIAGNOSIS — R2689 Other abnormalities of gait and mobility: Secondary | ICD-10-CM | POA: Diagnosis not present

## 2017-10-19 DIAGNOSIS — R29898 Other symptoms and signs involving the musculoskeletal system: Secondary | ICD-10-CM | POA: Insufficient documentation

## 2017-10-19 NOTE — Therapy (Signed)
[place], [place], [address] Phone: [phone]   Fax:  [phone]  Speech Language Pathology Evaluation  Patient Details  Name: [name] MRN: [id number] Date of Birth: 28-Jan-1929 Referring Provider: Alysia Penna, MD   Encounter Date: 10/19/2017  End of Session - 10/19/17 1752    Visit Number  1    Number of Visits  9    Date for SLP Re-Evaluation  11/19/17    Authorization Type  [employer]    SLP Start Time  7035    SLP Stop Time   1610    SLP Time Calculation (min)  45 min    Activity Tolerance  Patient tolerated treatment well       Past Medical History:  Diagnosis Date  . Allergic rhinitis   . Asthmatic bronchitis   . Atrial fibrillation ([place])   . Celiac disease   . Cellulitis   . Diverticulosis   . DM (diabetes mellitus) ([place])   . Fx. left wrist 1987  . Gastric polyps   . GERD (gastroesophageal reflux disease)   . History of pneumonia    w/pleural effusion  . Hypothyroidism   . Iron deficiency anemia   . Melanoma ([place])   . Neuropathy   . Prostate cancer ([place])   . REM sleep behavior disorder   . RLS (restless legs syndrome)   . Sleep apnea     Past Surgical History:  Procedure Laterality Date  . APPENDECTOMY    . CARPAL TUNNEL RELEASE    . CATARACT EXTRACTION  03/2011   bilateral   . CHOLECYSTECTOMY  2001  . COLONOSCOPY W/ BIOPSIES  04/27/2008   diverticulosis, duodenitis  . FOOT SURGERY    . HERNIA REPAIR  1994   bilateral  . KNEE SURGERY     x 2  . LUNG SURGERY  2001  . PROSTATECTOMY  2002  . UPPER GASTROINTESTINAL ENDOSCOPY  04/27/2008   celiac disease, gastric polyps    There were no vitals filed for this visit.  Subjective Assessment - 10/19/17 1742    Subjective  "Have a good speaking."    Patient is accompained by:  Family member    Currently in Pain?  No/denies         SLP Evaluation [facility] - 10/19/17 1742      SLP Visit Information   SLP  Received On  10/19/17    Referring Provider  Alysia Penna, MD    Onset Date  10/02/2017    Medical Diagnosis  left MCA CVA      Subjective   Subjective  "Have a good speaking."    Patient/Family Stated Goal  "Be able to talk to people"      General Information   HPI  [name] is an 84 yo male who presented to [facility] with stroke symptoms/aphasia. He was given tPA after head CT and transferred to [facility]. MRI showed acute left MCA territory infarct (temporal lobe). He eventually transferred to inpatient rehab before discharging home on 10/16/2017. He is referred to outpatient for ongoing SLP therapy to address cognitive communication changes with aphasia present. He is accompanied to today's evaluation by his wife, [name]. 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 He was driving and handled all finances at home. His wife is currently helping with both at this time. [name] hopes to improve his speech  and be able to communicate better with people since his stroke.    Behavioral/Cognition  Alert and cooperative    Mobility Status  ambulates with a walker      Balance Screen   Has the patient fallen in the past 6 months  No    Has the patient had a decrease in activity level because of a fear of falling?   No    Is the patient reluctant to leave their home because of a fear of falling?   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      Pain Assessment   Pain Assessment  No/denies pain      Cognition   Overall Cognitive Status  Impaired/Different from baseline    Area of Impairment  Memory    Memory  Decreased short-term memory    Memory Comments  Noted at home per wife since stroke    Memory   Impaired    Memory Impairment  Prospective memory;Decreased short term memory    Decreased Short Term Memory  Verbal complex    Awareness  Appears intact    Problem Solving  Impaired    Problem Solving Impairment  Verbal complex    Tour manager Comprehension   Overall Auditory Comprehension  Impaired    Yes/No Questions  Impaired    Complex Questions  75-100% accurate    Paragraph Comprehension (via yes/no questions)  76-100% accurate    Commands  Impaired    Two Step Basic Commands  75-100% accurate    Multistep Basic Commands  75-100% accurate    Conversation  Moderately complex    EffectiveTechniques  Extra processing time;Repetition      Visual Recognition/Discrimination   Discrimination  Within Function Limits      Reading Comprehension   Reading Status  Impaired    Word level  76-100% accurate    Sentence Level  76-100% accurate    Paragraph Level  Not tested    Functional Environmental (signs, name badge)  Within functional limits    Effective Techniques  Visual cueing      Expression   Primary Mode of Expression  Verbal      Verbal Expression   Overall Verbal Expression  Impaired    Initiation  No impairment    Automatic Speech  Name;Social Response;Counting    Level of Generative/Spontaneous Verbalization  Sentence    Repetition  Impaired    Level of Impairment  Sentence level    Naming  Impairment    Responsive  76-100% accurate    Confrontation  50-74% accurate    Convergent  75-100% accurate    Divergent  50-74% accurate    Verbal Errors  Phonemic paraphasias;Aware of errors;Inconsistent    Pragmatics  No impairment    Non-Verbal Means of Communication  Not applicable      Written Expression   Dominant Hand  Right    Written Expression  Exceptions to [facility]    Self Formulation Ability  Phrase      Oral Motor/Sensory Function   Overall Oral Motor/Sensory Function  Appears within functional limits for tasks assessed  Motor Speech   Overall Motor Speech  Appears within functional limits for tasks assessed    Respiration  Within functional limits    Phonation  Normal    Resonance  Within functional limits    Articulation  Within functional limitis    Intelligibility  Intelligible    Motor Planning  Witnin functional limits    Motor Speech Errors  -- suspect aphasia over apraxia    Phonation  WFL      Standardized Assessments   Standardized Assessments   -- portions of above tests        SLP Education - 10/19/17 1751    Education provided  Yes    Education Details  plan for SLP therapy to address expressive>receptive language deficits; memory/organization    Person(s) Educated  Patient;Spouse    Methods  Explanation    Comprehension  Verbalized understanding;Need further instruction       SLP Short Term Goals - 10/19/17 1755      SLP SHORT TERM GOAL #1   Title  Pt will implement word finding strategies with 90% accuracy when unable to verbalize desired word in conversation/functional tasks with mi/mod assist.    Baseline  75%    Time  4    Period  Weeks    Status  New    Target Date  11/19/17      SLP SHORT TERM GOAL #2   Title  Pt will complete functional check writing and balancing tasks with 100% acc with min assist and use of compensatory strategies as needed.    Baseline  Pt previously independent and not completing at this time since stroke    Time  4    Period  Weeks    Status  New    Target Date  11/19/17      SLP SHORT TERM GOAL #3   Title  Pt will self-correct paraphasic errors 90% of the time during conversational speech with min cues.    Baseline  ~75% of the time    Time  4    Period  Weeks    Status  New    Target Date  11/19/17      SLP SHORT TERM GOAL #4   Title  Pt will complete moderate-level thought organization and planning activities with 90% acc and min assist.    Baseline  mod assist    Time  4    Period  Weeks    Status  New    Target Date  11/19/17       SLP SHORT TERM GOAL #5   Title  Pt will increase moderately complex divergent naming tasks to 7+ items with min assist and use of strategies as needed.     Baseline  ~5-8 concrete    Time  4    Period  Weeks    Status  New    Target Date  11/19/17       SLP Long Term Goals - 10/19/17 1756      SLP LONG TERM GOAL #1   Title  Pt will express complex wants/needs to [facility] with use of compensatory strategies as needed.    Baseline  mi/mod impairment    Time  8    Period  Weeks    Status  New    Target Date  12/17/17       Plan - 10/19/17 1753    Clinical Impression Statement Pt presents with mild/mod expressive language deficits and mild receptive  language deficits which negatively impact conversational speech and following complex auditory directions. Pt exhibits paraphasic errors during naming tasks and during conversational speech and makes attempts to self correct. He is motivated to improve expressive language and reading skills. Reading comprehension will be further assessed during treatment sessions. Pt will benefit from skilled SLP in order to address the above impairments, maximize independence, and decrease burden of care.   Speech Therapy Frequency  2x / week    Duration  4 weeks    Treatment/Interventions  SLP instruction and feedback;Internal/external aids;Compensatory strategies;Compensatory techniques;Patient/family education;Functional tasks;Cueing hierarchy;Multimodal communcation approach    Potential to Achieve Goals  Good    SLP Home Exercise Plan  Pt will be independent with HEP as assigned to facilitate carryover of treatment strategies and techniques in home environment with assist from caregiver as needed.     Consulted and Agree with Plan of Care  Patient;Family member/caregiver    Family Member Consulted  Spouse, [name]       Patient will benefit from skilled therapeutic intervention in order to improve the following deficits and impairments:    Aphasia  Cognitive communication deficit    Problem List Patient Active Problem List   Diagnosis Date Noted  . 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 Aphasia   . PAF (paroxysmal atrial fibrillation) ([place])   . Diabetes mellitus type 2 in obese ([place])   . Benign essential HTN   . Hypothyroidism   . Hyperlipidemia   . Stroke (cerebrum) ([place]) 10/02/2017  . Afib ([place]) 01/10/2017  . SOB (shortness of breath) 01/10/2017  . Acute diastolic CHF (congestive heart failure) ([place]) 01/10/2017  . Atrial fibrillation with RVR ([place]) 01/10/2017  . Claudication ([place]) 07/25/2016  . Paroxysmal atrial fibrillation ([place]) 07/25/2016  . Odynophagia 04/10/2014  . Allergic rhinitis 03/23/2014  . Chronic rhinitis 12/22/2013  . 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 Constipation 08/22/2011  . Obesity 09/20/2009  . G E R D 11/29/2007  . Celiac disease 11/29/2007   Thank you,  Genene Churn, [place]  Genene Churn 10/19/2017, 6:12 PM  [facility] [address] [place], [place], [address] Phone: [phone]   Fax:  [phone]  Name: [name] MRN: [id number] Date of Birth: 01-12-1929

## 2017-10-19 NOTE — Telephone Encounter (Signed)
1st attempt for tc call with no answer.

## 2017-10-20 ENCOUNTER — Other Ambulatory Visit: Payer: Self-pay

## 2017-10-20 NOTE — Patient Outreach (Signed)
[facility]) Care Management  10/20/2017  [name] 07-May-1929 [id number]  Transition of care  Referral date:  10/20/17 Referral source: discharged from [facility] rehab 10/16/17 Insurance: [employer] advantage  Telephone call to patient regarding transition of care follow up. HIPAA verified with patient. Patient gave verbal authorization to speak with his Alexius, [employer] regarding all of his health information. RNCM discussed transition of care follow up program with patient and wife. Verbal agreement given for follow up.  Patient states he was in the hospital due to stroke. Patient states he has a follow up appointment scheduled with his primary MD on 10/22/17. Wife states patient has an appointment with Dr. Leonie Man, neurologist the first week of February 2019.    Wife states patient has been seen at outpatient rehab for evaluation on yesterday 10/19/17.  Wife states patient will be having outpatient rehab 2 times per week at [facility] rehab facility.  Wife states she and patient will follow up with the doctor regarding his medication Januvia.  Wife states she believes this medication was discontinued prior to patients hospitalization.  Patient is currently taking glipizide. Wife states she wants to confirm whether patient is suppose to take [place] or not. Wife and patient states they will discuss this with his primary MD on Thursday, 10/22/17.  Patient denies any new symptoms. RNCM reviewed with patient and wife signs/ symptoms of stroke.  Advised to call 911 for stroke like symptoms. Advised to follow up with doctor regarding non urgent symptoms.  RNCM advised patient to notify MD of any changes in condition prior to scheduled appointment. RNCM provided contact name and number: [phone] or main office number [phone] and 24 hour nurse advise line [phone].  RNCM verified patient aware of 911 services for urgent/ emergent needs.  ASSESSMENT: Ongoing transition  of care follow up  PLAN: RNCM will follow up with patient within 1 week.  RNCM will send patient [facility] care management welcome packet/ consent form RNCM will send patients primary MD involvement letter.  RNCM will send patient EMMI education material   Quinn Plowman RN,BSN,CCM [facility] Telephonic  [phone]

## 2017-10-21 ENCOUNTER — Ambulatory Visit (HOSPITAL_COMMUNITY): Payer: PPO

## 2017-10-21 ENCOUNTER — Other Ambulatory Visit: Payer: Self-pay

## 2017-10-21 ENCOUNTER — Ambulatory Visit (HOSPITAL_COMMUNITY): Payer: PPO | Admitting: Specialist

## 2017-10-21 ENCOUNTER — Encounter (HOSPITAL_COMMUNITY): Payer: Self-pay | Admitting: Specialist

## 2017-10-21 ENCOUNTER — Encounter (HOSPITAL_COMMUNITY): Payer: Self-pay

## 2017-10-21 DIAGNOSIS — R4701 Aphasia: Secondary | ICD-10-CM | POA: Diagnosis not present

## 2017-10-21 DIAGNOSIS — R2681 Unsteadiness on feet: Secondary | ICD-10-CM

## 2017-10-21 DIAGNOSIS — R2689 Other abnormalities of gait and mobility: Secondary | ICD-10-CM

## 2017-10-21 DIAGNOSIS — Z9181 History of falling: Secondary | ICD-10-CM

## 2017-10-21 DIAGNOSIS — I63512 Cerebral infarction due to unspecified occlusion or stenosis of left middle cerebral artery: Secondary | ICD-10-CM

## 2017-10-21 DIAGNOSIS — R29898 Other symptoms and signs involving the musculoskeletal system: Secondary | ICD-10-CM

## 2017-10-21 NOTE — Patient Instructions (Signed)
   SIT TO STAND - THIGH SUPPORT: 1-2 sets of 10 repetitions  Start by scooting close to the front of the chair.  Then lean forward and place your hands on your thighs. Rise up to standing using your hands for support.   Sit back down using your hands for support on your thighs and then repeat.      TANDEM STANCE WITH SUPPORT: 2-3 repetitions alternating foot position for 20 seconds  Stand in front of a chair, table or counter top for support. Then place the heel of one foot so that it is touching the toes of the other foot. Maintain your balance in this position.

## 2017-10-21 NOTE — Therapy (Signed)
[place], [place], [address] Phone: [phone]   Fax:  [phone]  Physical Therapy Evaluation  Patient Details  Name: [name] MRN: [id number] Date of Birth: September 24, 1929 Referring Provider: Dr. Letta Pate    Encounter Date: 10/21/2017  PT End of Session - 10/21/17 1839    Visit Number  1    Number of Visits  17    Date for PT Re-Evaluation  11/18/17    Authorization Type  [employer]    Authorization Time Period  10/21/17 - 12/16/17    PT Start Time  1346    PT Stop Time  1433    PT Time Calculation (min)  47 min    Activity Tolerance  Patient tolerated treatment well    Behavior During Therapy  [facility] for tasks assessed/performed       Past Medical History:  Diagnosis Date  . Allergic rhinitis   . Asthmatic bronchitis   . Atrial fibrillation ([place])   . Celiac disease   . Cellulitis   . Diverticulosis   . DM (diabetes mellitus) ([place])   . Fx. left wrist 1987  . Gastric polyps   . GERD (gastroesophageal reflux disease)   . History of pneumonia    w/pleural effusion  . Hypothyroidism   . Iron deficiency anemia   . Melanoma ([place])   . Neuropathy   . Prostate cancer ([place])   . REM sleep behavior disorder   . RLS (restless legs syndrome)   . Sleep apnea     Past Surgical History:  Procedure Laterality Date  . APPENDECTOMY    . CARPAL TUNNEL RELEASE    . CATARACT EXTRACTION  03/2011   bilateral   . CHOLECYSTECTOMY  2001  . COLONOSCOPY W/ BIOPSIES  04/27/2008   diverticulosis, duodenitis  . FOOT SURGERY    . HERNIA REPAIR  1994   bilateral  . KNEE SURGERY     x 2  . LUNG SURGERY  2001  . PROSTATECTOMY  2002  . UPPER GASTROINTESTINAL ENDOSCOPY  04/27/2008   celiac disease, gastric polyps    There were no vitals filed for this visit.   Subjective Assessment - 10/21/17 1352    Subjective  Patient reports he had his stroke on the Friday before Christmas, 10/02/17, and that it happened  during dinner. He states he got very confused while eating and his wife called 911. EMS then brought him to [facility] and transferred him to [facility]. He states he spent 2 weeks at [facility] and then returned home. He did not receive any Home Health PT and has been managing at home using his rolling walker to ambulate. He has not gone out of his home without assistance. He states he still has difficulty going getting dressed and bathing and uses a shower bench to get into his tub. He denies any falls since his stroke however reports he has had 3-4 in the last 6 months. He states he has had therapy for his poor balance before and that he has impaired sensation on BLE due to neuropathy. When asked he reports he checks his feet every night. Patient is slightly questionable historian due to memory impairments.    Limitations  Standing;Walking;House hold activities gettign dressed, bathing    How long can you sit comfortably?  unlimited    How long can you stand comfortably?  needs RW, 25 minutes easy    How long can you walk comfortably?  household distance    Patient Stated Goals  hopefully be able to walk and go to store and drive and all of those.     Currently in Pain?  No/denies         [facility] PT Assessment - 10/21/17 0001      Assessment   Medical Diagnosis  L MCA CVA    Referring Provider  Alysia Penna, MD    Onset Date/Surgical Date  10/02/17    Hand Dominance  Right    Next MD Visit  10/30/17    Prior Therapy  [facility] for CVA, OT/PT/SLP      Precautions   Precautions  Fall      Restrictions   Weight Bearing Restrictions  No      Balance Screen   Has the patient fallen in the past 6 months  Yes    How many times?  4 none since his stroke    Has the patient had a decrease in activity level because of a fear of falling?   Yes    Is the patient reluctant to leave their home because of a fear of falling?   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, used to read a lot, yardwork, gardening, Network engineer   Overall Cognitive Status  Impaired/Different from baseline    Area of Impairment  Memory see SLP evaluation for details      Observation/Other Assessments   Focus on Therapeutic Outcomes (FOTO)   55% limitation      Sensation   Light Touch  Impaired by gross assessment    Additional Comments  impaired light touch along plantar surface of feet patient reports neuropathy at baseline      Coordination   Coordination and Movement Description  WFL throughout functional mobility and transfers    Finger Nose Finger Test  Equal BUE with no obvious coordination deficits    Heel Shin Test  Equal BLE with no obvious coordination deficits      Functional Tests   Functional tests  Single leg stance      Posture/Postural Control   Posture/Postural Control  No significant limitations      Strength   Overall Strength Comments  Unable to test hip extension and abduction becasueof difficulty getting into position    Right Hip Flexion  4/5    Left Hip Flexion  5/5    Right Knee Flexion  4+/5    Right Knee Extension  4+/5    Left Knee Flexion  5/5    Left Knee Extension  5/5  Right Ankle Dorsiflexion  3/5    Right Ankle Plantar Flexion  3/5    Left Ankle Dorsiflexion  3/5     Left Ankle Plantar Flexion  3/5      Bed Mobility   Bed Mobility  Sit to Supine;Supine to Sit    Supine to Sit  6: Modified independent (Device/Increase time) moderate effort    Sit to Supine  6: Modified independent (Device/Increase time) moderate effort      Transfers   Five time sit to stand comments   20.17 seconds with use of BUE, patient uable to stand without use of UE to initiate, pateitn not counting reps and continued until stopped      Ambulation/Gait   Ambulation/Gait  Yes    Ambulation Distance (Feet)  490 Feet 3MWT    Assistive device  Rolling walker    Gait Pattern  Step-through pattern;Decreased stride length;Decreased hip/knee flexion - left;Decreased hip/knee flexion - right;Decreased dorsiflexion - left;Decreased dorsiflexion - right;Left foot flat;Right foot flat;Trunk flexed;Wide base of support    Ambulation Surface  Level    Gait velocity  0.82 m/s    Stairs  Yes    Stairs Assistance  7: Independent    Stair Management Technique  One rail Right;Alternating pattern;Forwards    Number of Stairs  4    Height of Stairs  6    Gait Comments  patient ambulating with BLE externally rotated using adductor for hip flexion to advance BLE      Static Standing Balance   Static Standing - Balance Support  No upper extremity supported    Static Standing Balance -  Activities   Romberg - Eyes Opened;Romberg - Eyes Closed;Tandam Stance - Right Leg;Tandam Stance - Left Leg;Single Leg Stance - Right Leg;Single Leg Stance - Left Leg    Static Standing - Comment/# of Minutes  Romberg EO = 30 seconds, EC = 14 seconds. SLS right = unable, left = 10 secodns. Tandem right = 10 seconds; left = 12 seconds      Standardized Balance Assessment   Five times sit to stand comments   20.17 seconds      Timed Up and Go Test   TUG  Normal TUG    Normal TUG (seconds)  21.5    TUG Comments  time of 21.5 seconds with RW is indicitive of fall risk      Objective measurements completed on  examination: See above findings.      PT Education - 10/21/17 [place]    Education provided  Yes    Education Details  Educated on exam findings and balance deficits. Educated on [place] and initiated HEP.    Person(s) Educated  Patient    Methods  Explanation;Handout    Comprehension  Verbalized understanding       PT Short Term Goals - 10/21/17 1848      PT SHORT TERM GOAL #1   Title  Patient will be independent with HEP and be able to demonstrate exercises to improve balance and strength with greater independence.    Time  2    Period  Weeks    Status  New    Target Date  11/04/17      PT SHORT TERM GOAL #2   Baseline  Patient will improve 3MWT by 100 feet to improve activity tolerance and demonstrate safe gait velocity above 1.2 m/s to indicate reduced fall risk.    Time  4    Period  Weeks  Status  New    Target Date  11/18/17      PT SHORT TERM GOAL #3   Title  Patient will be able to state 4 modifiable risk factors for stroke and report efforts to incorporate risk reducation at home to decrease recurrent stroke risk.    Time  4    Period  Weeks    Status  New        PT Long Term Goals - 10/21/17 1851      PT LONG TERM GOAL #1   Title  Patient will improve distance with 3MWT by 150 feet and ambulate with LRAD at 1.2 m/s or faster to demonstrate safe gait velocity for community ambulation and reduced fall risk.    Time  8    Period  Weeks    Status  New    Target Date  12/16/17      PT LONG TERM GOAL #2   Title  Patient will improve TUG to less than or equal to 13 seconds to demosntrate reduced fall risk.    Time  8    Period  Weeks    Status  New      PT LONG TERM GOAL #3   Title  Patient will demosntrate Five Time Sit to Stand in less than or equal to 13 seconds to demosntrate BLE strength improvements and reduce fall risk.    Time  8    Period  Weeks    Status  New         Plan - 10/21/17 1841    Clinical Impression Statement  Mr. Puleo presented  for initial PT evaluation following L CVA on 10/02/17. He currently is functioning below his PLOF baseline of independent to mobilize with no device. He now requires a rolling walker to ambulate and is limited by impaired balance, decreased activity tolerance, weakness of the lower extremities, sensation deficits, visual deficits, and memory/sequencing deficits. He will benefit from skilled PT services to address impairments, progress towards goals, and improve independence with functional mobility.    History and Personal Factors relevant to plan of care:  peripheral neuropathy at baseline    Clinical Presentation  Stable    Clinical Presentation due to:  impaired balance, decreased activity tolerance, weakness of the lower extremities, sensation deficits, visual deficits, and memory/sequencing deficits    Clinical Decision Making  Low    Rehab Potential  Good    PT Frequency  2x / week    PT Duration  8 weeks    PT Treatment/Interventions  ADLs/Self Care Home Management;Electrical Stimulation;DME Instruction;Gait training;Stair training;Functional mobility training;Therapeutic activities;Therapeutic exercise;Balance training;Neuromuscular re-education;Patient/family education;Manual techniques;Passive range of motion;Energy conservation;Visual/perceptual remediation/compensation    PT Next Visit Plan  Review Eval and goals. Print handout of HEP from eval for patient. Perform Lillie Fragmin Test. Initiate dynamic gait training with dual task and balance activities for SLS, and tandem.    PT Home Exercise Plan  Print handout for patient of HEP. Add marching at counter.    Consulted and Agree with Plan of Care  Patient       Patient will benefit from skilled therapeutic intervention in order to improve the following deficits and impairments:  Abnormal gait, Decreased knowledge of use of DME, Impaired sensation, Improper body mechanics, Decreased mobility, Decreased activity tolerance, Decreased  endurance, Decreased strength, Decreased balance, Difficulty walking, Impaired vision/preception  Visit Diagnosis: Left middle cerebral artery stroke (HCC)  Other abnormalities of gait and mobility  Unsteadiness on feet  History of falling     Problem List Patient Active Problem List   Diagnosis Date Noted  . 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 Aphasia   . PAF (paroxysmal atrial fibrillation) ([place])   . Diabetes mellitus type 2 in obese ([place])   . Benign essential HTN   . Hypothyroidism   . Hyperlipidemia   . Stroke (cerebrum) ([place]) 10/02/2017  . Afib ([place]) 01/10/2017  . SOB (shortness of breath) 01/10/2017  . Acute diastolic CHF (congestive heart failure) ([place]) 01/10/2017  . Atrial fibrillation with RVR ([place]) 01/10/2017  . Claudication ([place]) 07/25/2016  . Paroxysmal atrial fibrillation ([place]) 07/25/2016  . Odynophagia 04/10/2014  . Allergic rhinitis 03/23/2014  . Chronic rhinitis 12/22/2013  . 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 Constipation 08/22/2011  . Obesity 09/20/2009  . G E R D 11/29/2007  . Celiac disease 11/29/2007    Kipp Brood, PT, DPT Physical Therapist with [facility]  10/21/2017 6:56 PM    [place], [place], [address] Phone: [phone]   Fax:  [phone]  Name: [name] MRN: [id number] Date of Birth: January 25, 1929

## 2017-10-21 NOTE — Patient Instructions (Signed)
Home Exercises Program Theraputty Exercises  Do the following exercises 3 times a day using your affected hand.  1. Roll putty into a ball.  2. Make into a pancake.  3. Roll putty into a roll.  4. Pinch along log with first finger and thumb.   5. Make into a ball.  6. Roll it back into a log.   7. Pinch using thumb and side of first finger.  8. Roll into a ball, then flatten into a pancake.  9. Using your fingers, make putty into a mountain. Fine Motor Coordination Exercises  Perform the following exercises 2 times a day, as recommended by your occupational therapist.   Close all fingers and thumb into a tight fist and then open wide. (10 times)  Palm of hand on table, spread fingers apart, then together. (10 times)  Lift fingers and thumb off table one at a time. Increase speed as able. (10 times)  Touch thumb to each fingertip. Increase speed as able. (10 times)  Pick up 5 small objects (coins, marbles, paperclips, beads, etc.) one at a time and hold them in hand, then place them one by one onto the table.  Pick up small objects and place them into a cup or container.  Place clothespins onto the edge of a cup, can, or container.  Play card games. Practice shuffling and dealing cards. Flip cards over onto table one by one.  Practice screwing and unscrewing nuts/bolts.  Stack approximately [employer] (checkers, coins, etc.) onto table.  Practice writing skills, dot to dot, puzzles, etc.  With tweezers, pick up small objects and put into a small container. Try sorting beads or buttons.

## 2017-10-21 NOTE — Therapy (Signed)
[name], [place], [address] Phone: [phone]   Fax:  [phone]  Occupational Therapy Evaluation  Patient Details  Name: [name] MRN: [id number] Date of Birth: Aug 26, 1929 Dr. Letta Pate  Encounter Date: 10/21/2017  OT End of Session - 10/21/17 1548    Visit Number  1    Number of Visits  1    Authorization Type  [employer]     OT Start Time  1435    OT Stop Time  1515    OT Time Calculation (min)  40 min    Activity Tolerance  Patient tolerated treatment well    Behavior During Therapy  [facility] for tasks assessed/performed       Past Medical History:  Diagnosis Date  . Allergic rhinitis   . Asthmatic bronchitis   . Atrial fibrillation ([place])   . Celiac disease   . Cellulitis   . Diverticulosis   . DM (diabetes mellitus) ([place])   . Fx. left wrist 1987  . Gastric polyps   . GERD (gastroesophageal reflux disease)   . History of pneumonia    w/pleural effusion  . Hypothyroidism   . Iron deficiency anemia   . Melanoma ([place])   . Neuropathy   . Prostate cancer ([place])   . REM sleep behavior disorder   . RLS (restless legs syndrome)   . Sleep apnea     Past Surgical History:  Procedure Laterality Date  . APPENDECTOMY    . CARPAL TUNNEL RELEASE    . CATARACT EXTRACTION  03/2011   bilateral   . CHOLECYSTECTOMY  2001  . COLONOSCOPY W/ BIOPSIES  04/27/2008   diverticulosis, duodenitis  . FOOT SURGERY    . HERNIA REPAIR  1994   bilateral  . KNEE SURGERY     x 2  . LUNG SURGERY  2001  . PROSTATECTOMY  2002  . UPPER GASTROINTESTINAL ENDOSCOPY  04/27/2008   celiac disease, gastric polyps    There were no vitals filed for this visit.  Subjective Assessment - 10/21/17 1544    Subjective   S:  I can't do quite as well as I used to with dressing, and I am not sure why.    Pertinent History  [name] Patient is a 84 y.o. year old male with recent admission to the hospital on Oct 02, 2017  with history of diabetes mellitus with peripheral neuropathy, hypothyroidism, atrial fibrillation maintained on aspirin and multiple falls. Presented 10/02/2017 with aphasia. Per chart review patient lives with spouse independent prior to admission and still driving. Wife can assist. Cranial CT scan reviewed, unremarkable for acute process.  Patient did receive TPA. CT angiogram head and neck with no flow reducing intracranial stenosis. 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 Echocardiogram with ejection fraction of 65%. Systolic function was normal. EEG negative for seizure. Carotid Dopplers in no ICA stenosis.  Patient was admitted to CIR from 12/23-10/16/17, recieving PT, OT , and ST.  He has been referred to outpatient OT for evaluation and treatment.      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  No/denies        [facility] OT Assessment - 10/21/17 1555      Assessment   Medical Diagnosis  L MCA CVA    Referring Provider  Dr. Letta Pate     Onset Date/Surgical Date  10/02/17    Hand Dominance  Right    Next MD Visit  10/30/17    Prior Therapy  [facility]IR PT, OT, ST      Precautions   Precautions  Fall      Restrictions   Weight Bearing Restrictions  No      Balance Screen   Has the patient fallen in the past 6 months  No    Has the patient had a decrease in activity level because of a fear of falling?   No    Is the patient reluctant to leave their home because of a fear of falling?   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       Written Expression   Dominant Hand  Right      Vision - History   Baseline Vision  Wears glasses all the time    Additional Comments  at base line       Cognition   Overall Cognitive Status  -- defer to ST evaluation       Observation/Other Assessments   Focus on Therapeutic Outcomes (FOTO)   not completed one time visit       Coordination   Gross Motor Movements are Fluid and Coordinated  Yes    9 Hole Peg Test  Right;Left    Right 9 Hole Peg Test  36.50"    Left 9 Hole Peg Test  43.67"      ROM / Strength   AROM / PROM / Strength  AROM;Strength      AROM   Overall AROM Comments  BUE A/ROM is WNL      Strength   Overall Strength Comments  BUE strength is 5/5      Hand Function   Right Hand Grip (lbs)  36    Right Hand Lateral Pinch  18 lbs    Right Hand 3 Point Pinch  12 lbs    Left Hand Grip (lbs)  40    Left Hand Lateral Pinch  14 lbs    Left 3 point pinch  12 lbs                      OT Education - 10/21/17 1547    Education provided  Yes    Education Details  Educated on fine motor coordination and grip/pinch strengthening with theraputty,    Person(s) Educated  Patient    Methods  Explanation;Demonstration;Handout    Comprehension  Verbalized understanding;Returned demonstration       OT Short Term Goals - 10/21/17 1554      OT SHORT TERM GOAL #1   Title  Patient will be educated on and independent with HEP for grip and pinch strengthening and fine motor coordination training to improve overall independence with BADLs.     Time  1    Period  Weeks    Status  New  Target Date  10/28/17               Plan - 10/21/17 1549    Clinical Impression Statement  A:  Patient is a 84 y.o. year old male with recent admission to the hospital on Oct 02, 2017 with history of diabetes mellitus with peripheral neuropathy, hypothyroidism, atrial fibrillation maintained on  aspirin and multiple falls. Presented 10/02/2017 with aphasia. Per chart review patient lives with spouse independent prior to admission and still driving. Wife can assist. Cranial CT scan reviewed, unremarkable for acute process.  Patient did receive TPA. CT angiogram head and neck with no flow reducing intracranial stenosis. 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 Echocardiogram with ejection fraction of 65%. Systolic function was normal. EEG negative for seizure. Carotid Dopplers in no ICA stenosis. Patient evaluated for OT needs this date, patient demonstrates WFL A/ROM, strength, girp/pinch strength and coordiination of bilateral upper extremities.  Patient able to complete BADLs with occassional assistance and is pleased with his current functional status.  Patient will not benefit from skilled OT intervention at this time.  Patient was educated on a HEP for grip/pinch strengthening and fine motor coordination training.      Occupational Profile and client history currently impacting functional performance  motivation, support     Occupational performance deficits (Please refer to evaluation for details):  ADL's;IADL's;Work;Leisure    Rehab Potential  Excellent    Current Impairments/barriers affecting progress:  age and past medical history    OT Frequency  One time visit    OT Treatment/Interventions  Self-care/ADL training;Patient/family education    Plan  DC from skilled OT intervention this date with HEP.      Clinical Decision Making  Limited treatment options, no task modification necessary    OT Home Exercise Plan  Grip/pinch strengthening and coordination training     Consulted and Agree with Plan of Care  Patient       Patient will benefit from skilled therapeutic intervention in order to improve the following deficits and impairments:  Impaired UE functional use  Visit Diagnosis: Other symptoms and signs involving the musculoskeletal system    Problem List Patient Active  Problem List   Diagnosis Date Noted  . 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 Aphasia   . PAF (paroxysmal atrial fibrillation) ([place])   . Diabetes mellitus type 2 in obese ([place])   . Benign essential HTN   . Hypothyroidism   . Hyperlipidemia   . Stroke (cerebrum) ([place]) 10/02/2017  . Afib ([place]) 01/10/2017  . SOB (shortness of breath) 01/10/2017  . Acute diastolic CHF (congestive heart failure) ([place]) 01/10/2017  . Atrial fibrillation with RVR ([place]) 01/10/2017  . Claudication ([place]) 07/25/2016  . Paroxysmal atrial fibrillation ([place]) 07/25/2016  . Odynophagia 04/10/2014  . Allergic rhinitis 03/23/2014  . Chronic rhinitis 12/22/2013  . 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 Constipation 08/22/2011  . Obesity 09/20/2009  . G E R D 11/29/2007  . Celiac disease 11/29/2007    Vangie Bicker, [employer], OTR/L [phone]  10/21/2017, 3:59 PM  [place] [address] [place], [place], [address] Phone: [phone]   Fax:  [phone]  Name: [name] MRN: [id number] Date of Birth: [birth year]

## 2017-10-22 ENCOUNTER — Other Ambulatory Visit: Payer: Self-pay

## 2017-10-22 DIAGNOSIS — E1142 Type 2 diabetes mellitus with diabetic polyneuropathy: Secondary | ICD-10-CM | POA: Diagnosis not present

## 2017-10-22 DIAGNOSIS — Z712 Person consulting for explanation of examination or test findings: Secondary | ICD-10-CM | POA: Diagnosis not present

## 2017-10-22 DIAGNOSIS — M21379 Foot drop, unspecified foot: Secondary | ICD-10-CM | POA: Diagnosis not present

## 2017-10-22 DIAGNOSIS — I69320 Aphasia following cerebral infarction: Secondary | ICD-10-CM | POA: Diagnosis not present

## 2017-10-22 DIAGNOSIS — I10 Essential (primary) hypertension: Secondary | ICD-10-CM | POA: Diagnosis not present

## 2017-10-22 DIAGNOSIS — I69351 Hemiplegia and hemiparesis following cerebral infarction affecting right dominant side: Secondary | ICD-10-CM | POA: Diagnosis not present

## 2017-10-22 DIAGNOSIS — I63412 Cerebral infarction due to embolism of left middle cerebral artery: Secondary | ICD-10-CM | POA: Diagnosis not present

## 2017-10-22 DIAGNOSIS — Z6831 Body mass index (BMI) 31.0-31.9, adult: Secondary | ICD-10-CM | POA: Diagnosis not present

## 2017-10-22 DIAGNOSIS — I482 Chronic atrial fibrillation: Secondary | ICD-10-CM | POA: Diagnosis not present

## 2017-10-23 ENCOUNTER — Ambulatory Visit (HOSPITAL_COMMUNITY): Payer: PPO

## 2017-10-23 ENCOUNTER — Encounter (HOSPITAL_COMMUNITY): Payer: Self-pay

## 2017-10-23 DIAGNOSIS — R4701 Aphasia: Secondary | ICD-10-CM | POA: Diagnosis not present

## 2017-10-23 DIAGNOSIS — R2689 Other abnormalities of gait and mobility: Secondary | ICD-10-CM

## 2017-10-23 DIAGNOSIS — R2681 Unsteadiness on feet: Secondary | ICD-10-CM

## 2017-10-23 DIAGNOSIS — I63512 Cerebral infarction due to unspecified occlusion or stenosis of left middle cerebral artery: Secondary | ICD-10-CM

## 2017-10-23 DIAGNOSIS — Z9181 History of falling: Secondary | ICD-10-CM

## 2017-10-23 NOTE — Therapy (Signed)
[facility] [address] [place], [place], [address] Phone: [phone]   Fax:  [phone]  Physical Therapy Treatment  Patient Details  Name: [name] MRN: [id number] Date of Birth: May 11, 1929 Referring Provider: Alysia Penna, MD   Encounter Date: 10/23/2017  PT End of Session - 10/23/17 1344    Visit Number  2    Number of Visits  17    Date for PT Re-Evaluation  11/18/17    Authorization Type  [employer]    Authorization Time Period  10/21/17 - 12/16/17    PT Start Time  1346    PT Stop Time  1430    PT Time Calculation (min)  44 min    Equipment Utilized During Treatment  Gait belt    Activity Tolerance  Patient tolerated treatment well;Patient limited by fatigue    Behavior During Therapy  [facility] for tasks assessed/performed       Past Medical History:  Diagnosis Date  . Allergic rhinitis   . Asthmatic bronchitis   . Atrial fibrillation ([place])   . Celiac disease   . Cellulitis   . Diverticulosis   . DM (diabetes mellitus) ([place])   . Fx. left wrist 1987  . Gastric polyps   . GERD (gastroesophageal reflux disease)   . History of pneumonia    w/pleural effusion  . Hypothyroidism   . Iron deficiency anemia   . Melanoma ([place])   . Neuropathy   . Prostate cancer ([place])   . REM sleep behavior disorder   . RLS (restless legs syndrome)   . Sleep apnea     Past Surgical History:  Procedure Laterality Date  . APPENDECTOMY    . CARPAL TUNNEL RELEASE    . CATARACT EXTRACTION  03/2011   bilateral   . CHOLECYSTECTOMY  2001  . COLONOSCOPY W/ BIOPSIES  04/27/2008   diverticulosis, duodenitis  . FOOT SURGERY    . HERNIA REPAIR  1994   bilateral  . KNEE SURGERY     x 2  . LUNG SURGERY  2001  . PROSTATECTOMY  2002  . UPPER GASTROINTESTINAL ENDOSCOPY  04/27/2008   celiac disease, gastric polyps    There were no vitals filed for this visit.  Subjective Assessment - 10/23/17 1349    Subjective  Pt reports he  is feeling good today, no reports of pain of recent falls.  Has began HEP without questions.    Patient Stated Goals  hopefully be able to walk and go to store and drive and all of those.     Currently in Pain?  No/denies         [facility] PT Assessment - 10/23/17 0001      Assessment   Medical Diagnosis  L MCA CVA    Referring Provider  Alysia Penna, MD    Onset Date/Surgical Date  10/02/17    Hand Dominance  Right    Next MD Visit  10/30/17    Prior Therapy  CIR PT, OT, ST      Precautions   Precautions  Fall      Standardized Balance Assessment   Standardized Balance Assessment  Berg Balance Test      Berg Balance Test   Sit to Stand  Able to stand using hands after several tries    Standing Unsupported  Able to stand 2 minutes with supervision    Sitting with Back Unsupported but Feet Supported on Floor or Stool  Able to  sit safely and securely 2 minutes    Stand to Sit  Sits independently, has uncontrolled descent    Transfers  Able to transfer safely, definite need of hands    Standing Unsupported with Eyes Closed  Needs help to keep from falling    Standing Ubsupported with Feet Together  Able to place feet together independently but unable to hold for 30 seconds    From Standing, Reach Forward with Outstretched Arm  Can reach forward >5 cm safely (2")    From Standing Position, Pick up Object from Floor  Unable to pick up shoe, but reaches 2-5 cm (1-2") from shoe and balances independently    From Standing Position, Turn to Look Behind Over each Shoulder  Needs supervision when turning    Turn 360 Degrees  Able to turn 360 degrees safely but slowly 13" to complete rotation    Standing Unsupported, Alternately Place Feet on Step/Stool  Able to complete >2 steps/needs minimal assist 30"    Standing Unsupported, One Foot in Front  Needs help to step but can hold 15 seconds    Standing on One Leg  Tries to lift leg/unable to hold 3 seconds but remains standing independently     Total Score  25                       Balance Exercises - 10/23/17 1424      Balance Exercises: Standing   Tandem Stance  Eyes open;3 reps;30 secs intermittent HHA    Marching Limitations  10x alternating with intermittent HHA    Sit to Stand Time  10x without HHA elevated height to assist          PT Short Term Goals - 10/21/17 1848      PT SHORT TERM GOAL #1   Title  Patient will be independent with HEP and be able to demonstrate exercises to improve balance and strength with greater independence.    Time  2    Period  Weeks    Status  New    Target Date  11/04/17      PT SHORT TERM GOAL #2   Baseline  Patient will improve 3MWT by 100 feet to improve activity tolerance and demonstrate safe gait velocity above 1.2 m/s to indicate reduced fall risk.    Time  4    Period  Weeks    Status  New    Target Date  11/18/17      PT SHORT TERM GOAL #3   Title  Patient will be able to state 4 modifiable risk factors for stroke and report efforts to incorporate risk reducation at home to decrease recurrent stroke risk.    Time  4    Period  Weeks    Status  New        PT Long Term Goals - 10/21/17 1851      PT LONG TERM GOAL #1   Title  Patient will improve distance with 3MWT by 150 feet and ambulate with LRAD at 1.2 m/s or faster to demonstrate safe gait velocity for community ambulation and reduced fall risk.    Time  8    Period  Weeks    Status  New    Target Date  12/16/17      PT LONG TERM GOAL #2   Title  Patient will improve TUG to less than or equal to 13 seconds to demosntrate reduced fall risk.  Time  8    Period  Weeks    Status  New      PT LONG TERM GOAL #3   Title  Patient will demosntrate Five Time Sit to Stand in less than or equal to 13 seconds to demosntrate BLE strength improvements and reduce fall risk.    Time  8    Period  Weeks    Status  New            Plan - 10/23/17 1432    Clinical Impression Statement   Reviewed goals and copy of eval given to pt.  BERG balance testing complete with score 25/56.  Pt educated on findings and encouraged to continue ambulating with RW to reduce risk of fall.  Reviewed HEP techniques with verbalized understanding and ability to complete safely.  EOS pt was limited by fatgiue, required seated rest breaks through session.      Rehab Potential  Good    PT Frequency  2x / week    PT Duration  8 weeks    PT Treatment/Interventions  ADLs/Self Care Home Management;Electrical Stimulation;DME Instruction;Gait training;Stair training;Functional mobility training;Therapeutic activities;Therapeutic exercise;Balance training;Neuromuscular re-education;Patient/family education;Manual techniques;Passive range of motion;Energy conservation;Visual/perceptual remediation/compensation    PT Next Visit Plan  Next session add heel/toe raises and abduction, STS activities (cone taps) and static balance activities.  Initiate dynamic gait training with dual task and balance activities for SLS, and tandem.       Patient will benefit from skilled therapeutic intervention in order to improve the following deficits and impairments:  Abnormal gait, Decreased knowledge of use of DME, Impaired sensation, Improper body mechanics, Decreased mobility, Decreased activity tolerance, Decreased endurance, Decreased strength, Decreased balance, Difficulty walking, Impaired vision/preception  Visit Diagnosis: Left middle cerebral artery stroke (HCC)  Other abnormalities of gait and mobility  Unsteadiness on feet  History of falling     Problem List Patient Active Problem List   Diagnosis Date Noted  . 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 Aphasia   . PAF (paroxysmal atrial fibrillation) ([place])   . Diabetes mellitus type 2 in obese ([place])   . Benign essential HTN   . Hypothyroidism   . Hyperlipidemia   . Stroke (cerebrum) ([place]) 10/02/2017  . Afib ([place]) 01/10/2017  . SOB  (shortness of breath) 01/10/2017  . Acute diastolic CHF (congestive heart failure) ([place]) 01/10/2017  . Atrial fibrillation with RVR ([place]) 01/10/2017  . Claudication ([place]) 07/25/2016  . Paroxysmal atrial fibrillation ([place]) 07/25/2016  . Odynophagia 04/10/2014  . Allergic rhinitis 03/23/2014  . Chronic rhinitis 12/22/2013  . 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 Constipation 08/22/2011  . Obesity 09/20/2009  . G E R D 11/29/2007  . Celiac disease 11/29/2007   Ihor Austin, [place]; [place]  Aldona Lento 10/23/2017, 2:42 PM  [facility] [place], [place], [address] Phone: [phone]   Fax:  [phone]  Name: [name] MRN: [id number] Date of Birth: [birth year]

## 2017-10-23 NOTE — Patient Instructions (Signed)
Marching in Place: Varied Surfaces    Stand infront of counter and march in place, slowly lifting knees toward ceiling. Repeat 10 times per session. Do 2 sessions per day.  Copyright  [employer]. All rights reserved.

## 2017-10-26 ENCOUNTER — Telehealth (HOSPITAL_COMMUNITY): Payer: Self-pay | Admitting: Internal Medicine

## 2017-10-26 NOTE — Telephone Encounter (Signed)
10/26/17  wife left a message to [facility] - they have no power and are staying out of town with friends until their power returns

## 2017-10-27 ENCOUNTER — Ambulatory Visit (HOSPITAL_COMMUNITY): Payer: PPO | Admitting: Physical Therapy

## 2017-10-29 ENCOUNTER — Encounter (HOSPITAL_COMMUNITY): Payer: Self-pay | Admitting: Speech Pathology

## 2017-10-29 ENCOUNTER — Encounter (HOSPITAL_COMMUNITY): Payer: Self-pay

## 2017-10-29 ENCOUNTER — Ambulatory Visit (HOSPITAL_COMMUNITY): Payer: PPO

## 2017-10-29 ENCOUNTER — Ambulatory Visit (HOSPITAL_COMMUNITY): Payer: PPO | Admitting: Speech Pathology

## 2017-10-29 ENCOUNTER — Ambulatory Visit: Payer: Self-pay

## 2017-10-29 DIAGNOSIS — R2689 Other abnormalities of gait and mobility: Secondary | ICD-10-CM

## 2017-10-29 DIAGNOSIS — R4701 Aphasia: Secondary | ICD-10-CM | POA: Diagnosis not present

## 2017-10-29 DIAGNOSIS — R41841 Cognitive communication deficit: Secondary | ICD-10-CM

## 2017-10-29 DIAGNOSIS — I63512 Cerebral infarction due to unspecified occlusion or stenosis of left middle cerebral artery: Secondary | ICD-10-CM

## 2017-10-29 DIAGNOSIS — Z9181 History of falling: Secondary | ICD-10-CM

## 2017-10-29 DIAGNOSIS — R2681 Unsteadiness on feet: Secondary | ICD-10-CM

## 2017-10-29 NOTE — Therapy (Signed)
[facility] [place], [place], [address] Phone: [phone]   Fax:  [phone]  Speech Language Pathology Treatment  Patient Details  Name: [name] MRN: [id number] Date of Birth: 1928/12/28 Referring Provider: Alysia Penna, MD   Encounter Date: 10/29/2017  End of Session - 10/29/17 1608    Visit Number  2    Number of Visits  9    Date for SLP Re-Evaluation  11/19/17    Authorization Type  [employer]    SLP Start Time  3762    SLP Stop Time   1603    SLP Time Calculation (min)  48 min    Activity Tolerance  Patient tolerated treatment well       Past Medical History:  Diagnosis Date  . Allergic rhinitis   . Asthmatic bronchitis   . Atrial fibrillation ([place])   . Celiac disease   . Cellulitis   . Diverticulosis   . DM (diabetes mellitus) ([place])   . Fx. left wrist 1987  . Gastric polyps   . GERD (gastroesophageal reflux disease)   . History of pneumonia    w/pleural effusion  . Hypothyroidism   . Iron deficiency anemia   . Melanoma ([place])   . Neuropathy   . Prostate cancer ([place])   . REM sleep behavior disorder   . RLS (restless legs syndrome)   . Sleep apnea     Past Surgical History:  Procedure Laterality Date  . APPENDECTOMY    . CARPAL TUNNEL RELEASE    . CATARACT EXTRACTION  03/2011   bilateral   . CHOLECYSTECTOMY  2001  . COLONOSCOPY W/ BIOPSIES  04/27/2008   diverticulosis, duodenitis  . FOOT SURGERY    . HERNIA REPAIR  1994   bilateral  . KNEE SURGERY     x 2  . LUNG SURGERY  2001  . PROSTATECTOMY  2002  . UPPER GASTROINTESTINAL ENDOSCOPY  04/27/2008   celiac disease, gastric polyps    There were no vitals filed for this visit.  Subjective Assessment - 10/29/17 1527    Subjective  "Sometimes I get frustrated when words come out funny."    Currently in Pain?  No/denies       ADULT SLP TREATMENT - 10/29/17 0001      General Information   Behavior/Cognition   Alert;Cooperative;Pleasant mood    Patient Positioning  Upright in chair    Oral care provided  N/A    HPI  [name] is an 84 yo male who presented to [facility] ED with stroke symptoms/aphasia. He was given tPA after head CT and transferred to [facility]. MRI showed acute left MCA territory infarct (temporal lobe). He eventually transferred to inpatient rehab before discharging home on 10/16/2017. He is referred to outpatient for ongoing SLP therapy to address cognitive communication changes with aphasia present. He is accompanied to today's evaluation by his wife, [name]. 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 He was driving and handled all finances at home. His wife is currently helping with both at this time. [name] hopes to improve his speech and be able to communicate better with people since his stroke.       Treatment Provided   Treatment provided  Cognitive-Linquistic      Pain Assessment   Pain Assessment  No/denies pain      Cognitive-Linquistic Treatment   Treatment focused  on  Aphasia;Patient/family/caregiver education    Skilled Treatment  word finding strategies, word deduction, problem solving, setting up notebook, check writing and balancing check book      Assessment / Recommendations / [place] with current plan of care       SLP Education - 10/29/17 1607    Education provided  Yes    Education Details  provided notebook for [name]) Educated  Patient    Methods  Explanation;Handout    Comprehension  Verbalized understanding       SLP Short Term Goals - 10/29/17 1608      SLP SHORT TERM GOAL #1   Title  Pt will implement word finding strategies with 90% accuracy when unable to verbalize desired word in conversation/functional tasks with mi/mod assist.    Baseline  75%    Time  4    Period  Weeks    Status  On-going      SLP SHORT TERM GOAL #2   Title  Pt will complete  functional check writing and balancing tasks with 100% acc with min assist and use of compensatory strategies as needed.    Baseline  Pt previously independent and not completing at this time since stroke    Time  4    Period  Weeks    Status  On-going      SLP SHORT TERM GOAL #3   Title  Pt will self-correct paraphasic errors 90% of the time during conversational speech with min cues.    Baseline  ~75% of the time    Time  4    Period  Weeks    Status  On-going      SLP SHORT TERM GOAL #4   Title  Pt will complete moderate-level thought organization and planning activities with 90% acc and min assist.    Baseline  mod assist    Time  4    Period  Weeks    Status  On-going      SLP SHORT TERM GOAL #5   Title  Pt will increase moderately complex divergent naming tasks to 7+ items with min assist and use of strategies as needed.     Baseline  ~5-8 concrete    Time  4    Period  Weeks    Status  On-going       SLP Long Term Goals - 10/29/17 1609      SLP LONG TERM GOAL #1   Title  Pt will express complex wants/needs to [facility] with use of compensatory strategies as needed.    Baseline  mi/mod impairment    Time  8    Period  Weeks    Status  On-going       Plan - 10/29/17 1608    Clinical Impression Statement Pt alert and engaged in treatment this date. He completed check writing tasks with min assist and benefited from a model and rehearsal. He was encouraged to go ahead and help with check writing and balancing at home with assist from his wife. He had a few miscalculations when balancing and he was encouraged to check with a calculator at home. He is doing a great job of identifying paraphasic errors in conversation and self correcting with min assist. 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 [name] is making good progress toward goals. Continue POC.    Speech Therapy Frequency  2x /  week    Duration  4 weeks     Treatment/Interventions  SLP instruction and feedback;Internal/external aids;Compensatory strategies;Compensatory techniques;Patient/family education;Functional tasks;Cueing hierarchy;Multimodal communcation approach    Potential to Achieve Goals  Good    SLP Home Exercise Plan  Pt will be independent with HEP as assigned to facilitate carryover of treatment strategies and techniques in home environment with assist from caregiver as needed.     Consulted and Agree with Plan of Care  Patient;Family member/caregiver    Family Member Consulted  Spouse, [name]       Patient will benefit from skilled therapeutic intervention in order to improve the following deficits and impairments:   Aphasia  Cognitive communication deficit    Problem List Patient Active Problem List   Diagnosis Date Noted  . 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 Aphasia   . PAF (paroxysmal atrial fibrillation) ([place])   . Diabetes mellitus type 2 in obese ([place])   . Benign essential HTN   . Hypothyroidism   . Hyperlipidemia   . Stroke (cerebrum) ([place]) 10/02/2017  . Afib ([place]) 01/10/2017  . SOB (shortness of breath) 01/10/2017  . Acute diastolic CHF (congestive heart failure) ([place]) 01/10/2017  . Atrial fibrillation with RVR ([place]) 01/10/2017  . Claudication ([place]) 07/25/2016  . Paroxysmal atrial fibrillation ([place]) 07/25/2016  . Odynophagia 04/10/2014  . Allergic rhinitis 03/23/2014  . Chronic rhinitis 12/22/2013  . 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 Constipation 08/22/2011  . Obesity 09/20/2009  . G E R D 11/29/2007  . Celiac disease 11/29/2007   Thank you,  Genene Churn, [place]  [facility] 10/29/2017, 4:09 PM  [facility] [address] [place], [place], [address] Phone: [phone]   Fax:  [phone]   Name: [name] MRN: [id number] Date of Birth: July 18, 1929

## 2017-10-29 NOTE — Therapy (Signed)
[facility] [place], [place], [address] Phone: [phone]   Fax:  [phone]  Physical Therapy Treatment  Patient Details  Name: [name] MRN: [id number] Date of Birth: 1929/04/19 Referring Provider: Alysia Penna, MD   Encounter Date: 10/29/2017  PT End of Session - 10/29/17 1609    Visit Number  3    Number of Visits  17    Date for PT Re-Evaluation  11/18/17    Authorization Type  [employer]    Authorization Time Period  10/21/17 - 12/16/17    PT Start Time  1606    PT Stop Time  1648    PT Time Calculation (min)  42 min    Equipment Utilized During Treatment  Gait belt    Activity Tolerance  Patient tolerated treatment well    Behavior During Therapy  [facility] for tasks assessed/performed       Past Medical History:  Diagnosis Date  . Allergic rhinitis   . Asthmatic bronchitis   . Atrial fibrillation ([place])   . Celiac disease   . Cellulitis   . Diverticulosis   . DM (diabetes mellitus) ([place])   . Fx. left wrist 1987  . Gastric polyps   . GERD (gastroesophageal reflux disease)   . History of pneumonia    w/pleural effusion  . Hypothyroidism   . Iron deficiency anemia   . Melanoma ([place])   . Neuropathy   . Prostate cancer ([place])   . REM sleep behavior disorder   . RLS (restless legs syndrome)   . Sleep apnea     Past Surgical History:  Procedure Laterality Date  . APPENDECTOMY    . CARPAL TUNNEL RELEASE    . CATARACT EXTRACTION  03/2011   bilateral   . CHOLECYSTECTOMY  2001  . COLONOSCOPY W/ BIOPSIES  04/27/2008   diverticulosis, duodenitis  . FOOT SURGERY    . HERNIA REPAIR  1994   bilateral  . KNEE SURGERY     x 2  . LUNG SURGERY  2001  . PROSTATECTOMY  2002  . UPPER GASTROINTESTINAL ENDOSCOPY  04/27/2008   celiac disease, gastric polyps    There were no vitals filed for this visit.  Subjective Assessment - 10/29/17 1606    Subjective  Pt reports increased pain Bil feet  following standing last session.  Reports current pain Lt knee 3-4/10 sharp pain.  Reports compliance with HEP and no recent falls.     Patient Stated Goals  hopefully be able to walk and go to store and drive and all of those.     Currently in Pain?  Yes    Pain Score  4     Pain Location  Knee    Pain Orientation  Left    Pain Descriptors / Indicators  Sharp    Pain Type  Acute pain    Pain Onset  More than a month ago    Pain Frequency  Intermittent    Aggravating Factors   standing    Pain Relieving Factors  unsure, resting, NWB    Effect of Pain on Daily Activities  unsure, pushes through it.                        [facility] Adult PT Treatment/Exercise - 10/29/17 0001      Exercises   Exercises  Knee/Hip      Knee/Hip Exercises: Standing   Heel Raises  10  reps    Heel Raises Limitations  Toe raises; lacking full range    Hip ADduction  10 reps    Hip ADduction Limitations  cueing for form          Balance Exercises - 10/29/17 1614      Balance Exercises: Standing   Sidestepping  2 reps inside // bars with GTB,    Marching Limitations  10x alternating with intermittent HHA    Heel Raises Limitations  10x lacking range; completed 2nd set sitting    Toe Raise Limitations  10x lacking range; completed 2nd set sitting    Sit to Stand Time  6x without HHA; chair height prior fatigue           PT Short Term Goals - 10/21/17 1848      PT SHORT TERM GOAL #1   Title  Patient will be independent with HEP and be able to demonstrate exercises to improve balance and strength with greater independence.    Time  2    Period  Weeks    Status  New    Target Date  11/04/17      PT SHORT TERM GOAL #2   Baseline  Patient will improve 3MWT by 100 feet to improve activity tolerance and demonstrate safe gait velocity above 1.2 m/s to indicate reduced fall risk.    Time  4    Period  Weeks    Status  New    Target Date  11/18/17      PT SHORT TERM GOAL #3   Title   Patient will be able to state 4 modifiable risk factors for stroke and report efforts to incorporate risk reducation at home to decrease recurrent stroke risk.    Time  4    Period  Weeks    Status  New        PT Long Term Goals - 10/21/17 1851      PT LONG TERM GOAL #1   Title  Patient will improve distance with 3MWT by 150 feet and ambulate with LRAD at 1.2 m/s or faster to demonstrate safe gait velocity for community ambulation and reduced fall risk.    Time  8    Period  Weeks    Status  New    Target Date  12/16/17      PT LONG TERM GOAL #2   Title  Patient will improve TUG to less than or equal to 13 seconds to demosntrate reduced fall risk.    Time  8    Period  Weeks    Status  New      PT LONG TERM GOAL #3   Title  Patient will demosntrate Five Time Sit to Stand in less than or equal to 13 seconds to demosntrate BLE strength improvements and reduce fall risk.    Time  8    Period  Weeks    Status  New            Plan - 10/29/17 1650    Clinical Impression Statement  Session focus on LE strengthening and balance training.  Added hip and ankle strengthening exercises with moderate cueing to reduce ER with abduction.  Added heel/toe raises with inability to complete full range, pt given additional seated heel/toe raises to HEP.  Min-mod A with balance activities, especially with SLS activities.  No reoprts of pain through session, was limited by fatigue.    Rehab Potential  Good    PT  Frequency  2x / week    PT Duration  8 weeks    PT Treatment/Interventions  ADLs/Self Care Home Management;Electrical Stimulation;DME Instruction;Gait training;Stair training;Functional mobility training;Therapeutic activities;Therapeutic exercise;Balance training;Neuromuscular re-education;Patient/family education;Manual techniques;Passive range of motion;Energy conservation;Visual/perceptual remediation/compensation    PT Next Visit Plan  Continue heel/toe raises (try with seated  theraband next session) and abduction, STS and SLS activities (cone taps) and static balance activities.  Initiate dynamic gait training with dual task and balance activities for SLS, and tandem.    PT Home Exercise Plan  marching, tandem stance and sit to stand       Patient will benefit from skilled therapeutic intervention in order to improve the following deficits and impairments:  Abnormal gait, Decreased knowledge of use of DME, Impaired sensation, Improper body mechanics, Decreased mobility, Decreased activity tolerance, Decreased endurance, Decreased strength, Decreased balance, Difficulty walking, Impaired vision/preception  Visit Diagnosis: Left middle cerebral artery stroke (HCC)  Other abnormalities of gait and mobility  Unsteadiness on feet  History of falling     Problem List Patient Active Problem List   Diagnosis Date Noted  . 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 Aphasia   . PAF (paroxysmal atrial fibrillation) ([place])   . Diabetes mellitus type 2 in obese ([place])   . Benign essential HTN   . Hypothyroidism   . Hyperlipidemia   . Stroke (cerebrum) ([place]) 10/02/2017  . Afib ([place]) 01/10/2017  . SOB (shortness of breath) 01/10/2017  . Acute diastolic CHF (congestive heart failure) ([place]) 01/10/2017  . Atrial fibrillation with RVR ([place]) 01/10/2017  . Claudication ([place]) 07/25/2016  . Paroxysmal atrial fibrillation ([place]) 07/25/2016  . Odynophagia 04/10/2014  . Allergic rhinitis 03/23/2014  . Chronic rhinitis 12/22/2013  . 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 Constipation 08/22/2011  . Obesity 09/20/2009  . G E R D 11/29/2007  . Celiac disease 11/29/2007   Ihor Austin, [place]; [place]  Aldona Lento 10/29/2017, 5:01 PM  [place], [place], [address] Phone: [phone]   Fax:  [phone]  Name: [name] MRN: [id number] Date of Birth: [birth year]

## 2017-10-29 NOTE — Patient Instructions (Signed)
Toe / Heel Raise (Sitting)    Sitting, raise heels, then rock back on heels and raise toes. Repeat 10-20 times.  Copyright  VHI. All rights reserved.

## 2017-10-30 ENCOUNTER — Inpatient Hospital Stay: Payer: PPO | Admitting: Physical Medicine & Rehabilitation

## 2017-11-02 ENCOUNTER — Ambulatory Visit: Payer: PPO | Admitting: Physical Medicine & Rehabilitation

## 2017-11-02 ENCOUNTER — Other Ambulatory Visit: Payer: Self-pay

## 2017-11-02 ENCOUNTER — Encounter: Payer: Self-pay | Admitting: Physical Medicine & Rehabilitation

## 2017-11-02 ENCOUNTER — Encounter: Payer: PPO | Attending: Physical Medicine & Rehabilitation

## 2017-11-02 VITALS — BP 163/69 | HR 62

## 2017-11-02 DIAGNOSIS — I69320 Aphasia following cerebral infarction: Secondary | ICD-10-CM | POA: Insufficient documentation

## 2017-11-02 DIAGNOSIS — M21371 Foot drop, right foot: Secondary | ICD-10-CM | POA: Insufficient documentation

## 2017-11-02 DIAGNOSIS — E1140 Type 2 diabetes mellitus with diabetic neuropathy, unspecified: Secondary | ICD-10-CM | POA: Diagnosis not present

## 2017-11-02 DIAGNOSIS — R269 Unspecified abnormalities of gait and mobility: Secondary | ICD-10-CM | POA: Diagnosis not present

## 2017-11-02 DIAGNOSIS — I69351 Hemiplegia and hemiparesis following cerebral infarction affecting right dominant side: Secondary | ICD-10-CM | POA: Diagnosis not present

## 2017-11-02 DIAGNOSIS — I69398 Other sequelae of cerebral infarction: Secondary | ICD-10-CM | POA: Diagnosis not present

## 2017-11-02 DIAGNOSIS — M21372 Foot drop, left foot: Secondary | ICD-10-CM | POA: Diagnosis not present

## 2017-11-02 DIAGNOSIS — I63512 Cerebral infarction due to unspecified occlusion or stenosis of left middle cerebral artery: Secondary | ICD-10-CM | POA: Diagnosis not present

## 2017-11-02 NOTE — Patient Outreach (Signed)
[facility]) Care Management  11/02/2017  [name] [birth year] [id number]  .Transition of care  Referral date:  10/20/17 Referral source: discharged from [facility] health rehab 10/16/17 Insurance: Health team advantage Attempt #1  Telephone call to patient regarding transition of care follow up. Unable to reach patient. HIPAA compliant voice message left with call back phone number.   PLAN:  RNCM will attempt 2nd telephone call to patient within 1 week.   Quinn Plowman RN,BSN,CCM [facility] Telephonic  [phone]

## 2017-11-02 NOTE — Patient Instructions (Signed)
Try [place] or Am Nani Skillern

## 2017-11-02 NOTE — Progress Notes (Signed)
Subjective:    Patient ID: [name], male    DOB: 1928-12-18, 84 y.o.   MRN: [id number] DATE OF ADMISSION:  10/07/2017 DATE OF DISCHARGE:  10/16/2017   HPI 84 year old male with history of atrial fibrillation suffered a left MCA distribution infarct which resulted in a aphasia as well as mild right-sided weakness and gait disorder.  He was hospitalized at [facility] inpatient stroke program dates listed above. Discharge to home where he is staying with family.  He has started outpatient PT OT and speech therapy. He is independent with his self-care and mobility.  He still has 24-hour supervision and his wife is asking whether she could leave to do errands from time to time. The patient does not drive. He was told by his neurologist to always use the walker because of his fall risk.  He has a history of diabetic peripheral neuropathy and was falling even prior to his stroke. Anxiety at noc, 2 episode woke up at home in the middle of night.  Patient states this is no longer occurring The patient has seen his primary care physician Pain Inventory Average Pain 0 Pain Right Now 0 My pain is na  In the last 24 hours, has pain interfered with the following? General activity 0 Relation with others 0 Enjoyment of life 0 What TIME of day is your pain at its worst? na Sleep (in general) Good  Pain is worse with: na Pain improves with: na Relief from Meds: na  Mobility use a walker ability to climb steps?  yes do you drive?  no  Function retired  Neuro/Psych bladder control problems bowel control problems trouble walking  Prior Studies Any changes since last visit?  no  Physicians involved in your care Any changes since last visit?  no   Family History  Problem Relation Age of Onset  . Breast cancer Mother   . Kidney failure Father   . Heart disease Father   . Rheumatic fever Father   . Colon cancer Unknown    Social History   Socioeconomic History  .  Marital status: Married    Spouse name: Not on file  . Number of children: Not on file  . Years of education: Not on file  . Highest education level: Not on file  Social Needs  . Financial resource strain: Not on file  . Food insecurity - worry: Not on file  . Food insecurity - inability: Not on file  . Transportation needs - medical: Not on file  . Transportation needs - non-medical: Not on file  Occupational History  . Occupation: Retired    Comment: Mining engineer: [employer]  Tobacco Use  . Smoking status: Never Smoker  . Smokeless tobacco: Never Used  Substance and Sexual Activity  . Alcohol use: No  . Drug use: No  . Sexual activity: No    Birth control/protection: Abstinence  Other Topics Concern  . 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 APPENDECTOMY    . CARPAL TUNNEL RELEASE    . CATARACT EXTRACTION  03/2011   bilateral   . CHOLECYSTECTOMY  2001  . COLONOSCOPY W/ BIOPSIES  04/27/2008   diverticulosis, duodenitis  . FOOT SURGERY    . HERNIA REPAIR  1994   bilateral  . KNEE SURGERY     x 2  .  LUNG SURGERY  2001  . PROSTATECTOMY  2002  . UPPER GASTROINTESTINAL ENDOSCOPY  04/27/2008   celiac disease, gastric polyps   Past Medical History:  Diagnosis Date  . Allergic rhinitis   . Asthmatic bronchitis   . Atrial fibrillation ([place])   . Celiac disease   . Cellulitis   . Diverticulosis   . DM (diabetes mellitus) ([place])   . Fx. left wrist 1987  . Gastric polyps   . GERD (gastroesophageal reflux disease)   . History of pneumonia    w/pleural effusion  . Hypothyroidism   . Iron deficiency anemia   . Melanoma ([place])   . Neuropathy   . Prostate cancer ([place])   . REM sleep behavior disorder   . RLS (restless legs syndrome)   . Sleep apnea    There were no vitals taken for this visit.  Opioid Risk Score:   Fall Risk Score:   `1  Depression screen PHQ 2/9  No flowsheet data found.   Review of Systems  Constitutional: Negative.   HENT: Negative.   Eyes: Negative.   Respiratory: Negative.   Cardiovascular: Negative.   Gastrointestinal: Positive for constipation.  Endocrine: Negative.   Genitourinary: Negative.   Musculoskeletal: Negative.   Skin: Negative.   Allergic/Immunologic: Negative.   Neurological: Negative.   Hematological: Negative.   Psychiatric/Behavioral: Negative.   All other systems reviewed and are negative.      Objective:   Physical Exam  Well-developed well-nourished male in no acute distress Mood and affect are appropriate Motor strength is 5/5 bilateral deltoid bicep tricep grip hip flexor knee extensor, 4- bilateral ankle dorsiflexor Gait is using a rolling walker there is a mild slap foot gait bilaterally no evidence of knee instability. He is able to name simple objects. He has some problems with speech fluency although he is able to speak at the sentence level for the most part.      Assessment & Plan:  1.  Left MCA distribution infarct with right hemiparesis which has essentially resolved.  He has residual mild a aphasia which I think will improve over the next several months. His gait disorder is partially related to his stroke but also related to his bilateral foot drop which results from his diabetic neuropathy. Agree with neurology long-term use of walker. Also discussed possible use of ankle-foot orthoses.  At this point I do not think he needs them. Continue outpatient PT and speech Follow-up with neurology Follow-up with PCP Follow-up with cardiology Physical medicine rehab follow-up on as-needed basis.

## 2017-11-03 ENCOUNTER — Other Ambulatory Visit: Payer: Self-pay

## 2017-11-03 NOTE — Patient Outreach (Signed)
[name]  11/03/2017  [name] December 11, 1928 [id number]  .Transition of care  Referral date:10/20/17 Referral source:discharged from [facility] health rehab 10/16/17 Insurance:Health team advantage Attempt #2  Telephone call to patient regarding transition of care follow up. Unable to reach patient. HIPAA compliant voice message left with call back phone number.   PLAN:  RNCM will attempt 3rd telephone call to patient within 1 week.   Quinn Plowman RN,BSN,CCM [facility] Telephonic  [phone]

## 2017-11-04 ENCOUNTER — Ambulatory Visit (HOSPITAL_COMMUNITY): Payer: PPO

## 2017-11-04 ENCOUNTER — Encounter (HOSPITAL_COMMUNITY): Payer: Self-pay

## 2017-11-04 ENCOUNTER — Ambulatory Visit: Payer: Self-pay

## 2017-11-04 ENCOUNTER — Ambulatory Visit (HOSPITAL_COMMUNITY): Payer: PPO | Admitting: Speech Pathology

## 2017-11-04 ENCOUNTER — Other Ambulatory Visit: Payer: Self-pay

## 2017-11-04 ENCOUNTER — Encounter (HOSPITAL_COMMUNITY): Payer: Self-pay | Admitting: Speech Pathology

## 2017-11-04 DIAGNOSIS — I63512 Cerebral infarction due to unspecified occlusion or stenosis of left middle cerebral artery: Secondary | ICD-10-CM

## 2017-11-04 DIAGNOSIS — R2681 Unsteadiness on feet: Secondary | ICD-10-CM

## 2017-11-04 DIAGNOSIS — R4701 Aphasia: Secondary | ICD-10-CM | POA: Diagnosis not present

## 2017-11-04 DIAGNOSIS — R41841 Cognitive communication deficit: Secondary | ICD-10-CM

## 2017-11-04 DIAGNOSIS — Z9181 History of falling: Secondary | ICD-10-CM

## 2017-11-04 DIAGNOSIS — R2689 Other abnormalities of gait and mobility: Secondary | ICD-10-CM

## 2017-11-04 NOTE — Therapy (Signed)
[place], [place], [address] Phone: [phone]   Fax:  [phone]  Speech Language Pathology Treatment  Patient Details  Name: [name] MRN: [id number] Date of Birth: 1929-05-03 Referring Provider: Alysia Penna, MD   Encounter Date: 11/04/2017  End of Session - 11/04/17 2009    Visit Number  3    Number of Visits  9    Date for SLP Re-Evaluation  11/19/17    Authorization Type  [employer]    SLP Start Time  1030    SLP Stop Time   1115    SLP Time Calculation (min)  45 min    Activity Tolerance  Patient tolerated treatment well       Past Medical History:  Diagnosis Date  . Allergic rhinitis   . Asthmatic bronchitis   . Atrial fibrillation ([place])   . Celiac disease   . Cellulitis   . Diverticulosis   . DM (diabetes mellitus) ([place])   . Fx. left wrist 1987  . Gastric polyps   . GERD (gastroesophageal reflux disease)   . History of pneumonia    w/pleural effusion  . Hypothyroidism   . Iron deficiency anemia   . Melanoma ([place])   . Neuropathy   . Prostate cancer ([place])   . REM sleep behavior disorder   . RLS (restless legs syndrome)   . Sleep apnea     Past Surgical History:  Procedure Laterality Date  . APPENDECTOMY    . CARPAL TUNNEL RELEASE    . CATARACT EXTRACTION  03/2011   bilateral   . CHOLECYSTECTOMY  2001  . COLONOSCOPY W/ BIOPSIES  04/27/2008   diverticulosis, duodenitis  . FOOT SURGERY    . HERNIA REPAIR  1994   bilateral  . KNEE SURGERY     x 2  . LUNG SURGERY  2001  . PROSTATECTOMY  2002  . UPPER GASTROINTESTINAL ENDOSCOPY  04/27/2008   celiac disease, gastric polyps    There were no vitals filed for this visit.  Subjective Assessment - 11/04/17 2008    Subjective  "My wife is very patient with me."    Currently in Pain?  No/denies       ADULT SLP TREATMENT - 11/04/17 0001      General Information   Behavior/Cognition  Alert;Cooperative;Pleasant  mood    Patient Positioning  Upright in chair    Oral care provided  N/A    HPI  [name] is an 84 yo male who presented to [facility] ED with stroke symptoms/aphasia. He was given tPA after head CT and transferred to [facility]. MRI showed acute left MCA territory infarct (temporal lobe). He eventually transferred to inpatient rehab before discharging home on 10/16/2017. He is referred to outpatient for ongoing SLP therapy to address cognitive communication changes with aphasia present. He is accompanied to today's evaluation by his wife, Tucker Minter. 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 He was driving and handled all finances at home. His wife is currently helping with both at this time. [name] hopes to improve his speech and be able to communicate better with people since his stroke.       Treatment Provided   Treatment provided  Cognitive-Linquistic      Pain Assessment   Pain Assessment  No/denies pain      Cognitive-Linquistic Treatment   Treatment focused on  Aphasia;Patient/family/caregiver education    Skilled Treatment  word finding strategies, word deduction, problem solving, setting up notebook, check writing and balancing check book      Assessment / Recommendations / [place] with current plan of care      Progression Toward Goals   Progression toward goals  Progressing toward goals         SLP Short Term Goals - 11/04/17 2010      SLP SHORT TERM GOAL #1   Title  Pt will implement word finding strategies with 90% accuracy when unable to verbalize desired word in conversation/functional tasks with mi/mod assist.    Baseline  75%    Time  4    Period  Weeks    Status  On-going      SLP SHORT TERM GOAL #2   Title  Pt will complete functional check writing and balancing tasks with 100% acc with min assist and use of compensatory strategies as needed.    Baseline  Pt previously independent  and not completing at this time since stroke    Time  4    Period  Weeks    Status  On-going      SLP SHORT TERM GOAL #3   Title  Pt will self-correct paraphasic errors 90% of the time during conversational speech with min cues.    Baseline  ~75% of the time    Time  4    Period  Weeks    Status  On-going      SLP SHORT TERM GOAL #4   Title  Pt will complete moderate-level thought organization and planning activities with 90% acc and min assist.    Baseline  mod assist    Time  4    Period  Weeks    Status  On-going      SLP SHORT TERM GOAL #5   Title  Pt will increase moderately complex divergent naming tasks to 7+ items with min assist and use of strategies as needed.     Baseline  ~5-8 concrete    Time  4    Period  Weeks    Status  On-going       SLP Long Term Goals - 10/29/17 1609      SLP LONG TERM GOAL #1   Title  Pt will express complex wants/needs to [facility] with use of compensatory strategies as needed.    Baseline  mi/mod impairment    Time  8    Period  Weeks    Status  On-going       Plan - 11/04/17 2010    Clinical Impression Statement Pt alert and engaged in treatment this date. He independently self corrected 100% of paraphasic errors in session today. SLP provided further education regarding aphasia and paraphasic errors vs apraxia (do not suspect apraxia). He completed word description tasks with graded levels to make it more challenging. He struggled to complete when time component was added. He required mild/mod cues assist with deduction reading comprehension task (no problems with deduction, but some difficulty with comprehension). [name] is making good progress toward goals. Continue POC.     Speech Therapy Frequency  2x / week    Duration  4 weeks    Treatment/Interventions  SLP instruction and feedback;Internal/external aids;Compensatory strategies;Compensatory techniques;Patient/family education;Functional tasks;Cueing hierarchy;Multimodal  communcation approach    Potential to Achieve Goals  Good    SLP Home Exercise Plan  Pt will be independent  with HEP as assigned to facilitate carryover of treatment strategies and techniques in home environment with assist from caregiver as needed.     Consulted and Agree with Plan of Care  Patient;Family member/caregiver    Family Member Consulted  Spouse, [name]       Patient will benefit from skilled therapeutic intervention in order to improve the following deficits and impairments:   Aphasia  Cognitive communication deficit    Problem List Patient Active Problem List   Diagnosis Date Noted  . Gait disturbance, post-stroke 11/02/2017  . Aphasia as late effect of cerebrovascular accident 11/02/2017  . Left middle cerebral artery stroke ([place]) 10/07/2017  . Aphasia   . PAF (paroxysmal atrial fibrillation) ([place])   . Diabetes mellitus type 2 in obese ([place])   . Benign essential HTN   . Hypothyroidism   . Hyperlipidemia   . Stroke (cerebrum) ([place]) 10/02/2017  . Afib ([place]) 01/10/2017  . SOB (shortness of breath) 01/10/2017  . Acute diastolic CHF (congestive heart failure) ([place]) 01/10/2017  . Atrial fibrillation with RVR ([place]) 01/10/2017  . Claudication ([place]) 07/25/2016  . Paroxysmal atrial fibrillation ([place]) 07/25/2016  . Odynophagia 04/10/2014  . Allergic rhinitis 03/23/2014  . Chronic rhinitis 12/22/2013  . 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 Constipation 08/22/2011  . Obesity 09/20/2009  . G E R D 11/29/2007  . Celiac disease 11/29/2007   Thank you,  Genene Churn, [place]  [facility] 11/04/2017, 8:11 PM  [place], [place], [address] Phone: [phone]   Fax:  [phone]   Name: [name] MRN: [id number] Date of Birth: [birth year]

## 2017-11-04 NOTE — Therapy (Signed)
[place], [place], [address] Phone: [phone]   Fax:  [phone]  Physical Therapy Treatment  Patient Details  Name: [name] MRN: [id number] Date of Birth: 08-15-1929 Referring Provider: Alysia Penna, MD   Encounter Date: 11/04/2017  PT End of Session - 11/04/17 0946    Visit Number  4    Number of Visits  17    Date for PT Re-Evaluation  11/18/17    Authorization Type  [employer]    Authorization Time Period  10/21/17 - 12/16/17    PT Start Time  0947    PT Stop Time  1031    PT Time Calculation (min)  44 min    Equipment Utilized During Treatment  Gait belt    Activity Tolerance  Patient tolerated treatment well    Behavior During Therapy  [facility] for tasks assessed/performed       Past Medical History:  Diagnosis Date  . Allergic rhinitis   . Asthmatic bronchitis   . Atrial fibrillation ([place])   . Celiac disease   . Cellulitis   . Diverticulosis   . DM (diabetes mellitus) ([place])   . Fx. left wrist 1987  . Gastric polyps   . GERD (gastroesophageal reflux disease)   . History of pneumonia    w/pleural effusion  . Hypothyroidism   . Iron deficiency anemia   . Melanoma ([place])   . Neuropathy   . Prostate cancer ([place])   . REM sleep behavior disorder   . RLS (restless legs syndrome)   . Sleep apnea     Past Surgical History:  Procedure Laterality Date  . APPENDECTOMY    . CARPAL TUNNEL RELEASE    . CATARACT EXTRACTION  03/2011   bilateral   . CHOLECYSTECTOMY  2001  . COLONOSCOPY W/ BIOPSIES  04/27/2008   diverticulosis, duodenitis  . FOOT SURGERY    . HERNIA REPAIR  1994   bilateral  . KNEE SURGERY     x 2  . LUNG SURGERY  2001  . PROSTATECTOMY  2002  . UPPER GASTROINTESTINAL ENDOSCOPY  04/27/2008   celiac disease, gastric polyps    There were no vitals filed for this visit.  Subjective Assessment - 11/04/17 1039    Subjective  Patient states he is well today. He has  some constant chronic knee pain and states it's always been there. He states his HEP is going well each day and denies falls.    Patient Stated Goals  hopefully be able to walk and go to store and drive and all of those.     Currently in Pain?  Yes    Pain Score  2     Pain Location  Knee    Pain Orientation  Left;Right    Pain Descriptors / Indicators  Aching    Pain Type  Chronic pain    Pain Onset  More than a month ago    Pain Frequency  Constant    Aggravating Factors   walking, standing    Pain Relieving Factors  rest, unsure    Effect of Pain on Daily Activities  pushes through it       [facility] Adult PT Treatment/Exercise - 11/04/17 0001      Knee/Hip Exercises: Aerobic   Nustep  LE only for recipricol movement and strengthening2x 2 minutes with rest therapeutic rest break, level 1, seat 1      Knee/Hip Exercises: Standing   Lateral  Step Up  Step Height: 4";Hand Hold: 2;15 reps;Both    Forward Step Up  Both;15 reps;Hand Hold: 1;Step Height: 4"      Knee/Hip Exercises: Seated   Other Seated Knee/Hip Exercises  heel raises  and toe raises; 1x 15 with 5 second holds each    Sit to Sand  2 sets;5 reps;10 reps 1x 5; 1x 10 1 UE support with cues for trunk lean       Balance Exercises - 11/04/17 1041      Balance Exercises: Standing   Tandem Stance  Foam/compliant surface;Eyes open;30 secs;4 reps 2x each LE (alternated foot position)    Marching Limitations  2x 1 minute with foot taps on 4" step as patient has difficulty sequencing marching without visual/tactile cue        PT Education - 11/04/17 1043    Education provided  Yes    Education Details  educated on form/technique with exercise throughout. Educated on weight shifting ofr sfae sit to stand sequencing.    Person(s) Educated  Patient    Methods  Explanation    Comprehension  Verbalized understanding;Need further instruction;Returned demonstration       PT Short Term Goals - 10/21/17 1848      PT SHORT TERM  GOAL #1   Title  Patient will be independent with HEP and be able to demonstrate exercises to improve balance and strength with greater independence.    Time  2    Period  Weeks    Status  New    Target Date  11/04/17      PT SHORT TERM GOAL #2   Baseline  Patient will improve 3MWT by 100 feet to improve activity tolerance and demonstrate safe gait velocity above 1.2 m/s to indicate reduced fall risk.    Time  4    Period  Weeks    Status  New    Target Date  11/18/17      PT SHORT TERM GOAL #3   Title  Patient will be able to state 4 modifiable risk factors for stroke and report efforts to incorporate risk reducation at home to decrease recurrent stroke risk.    Time  4    Period  Weeks    Status  New        PT Long Term Goals - 10/21/17 1851      PT LONG TERM GOAL #1   Title  Patient will improve distance with 3MWT by 150 feet and ambulate with LRAD at 1.2 m/s or faster to demonstrate safe gait velocity for community ambulation and reduced fall risk.    Time  8    Period  Weeks    Status  New    Target Date  12/16/17      PT LONG TERM GOAL #2   Title  Patient will improve TUG to less than or equal to 13 seconds to demosntrate reduced fall risk.    Time  8    Period  Weeks    Status  New      PT LONG TERM GOAL #3   Title  Patient will demosntrate Five Time Sit to Stand in less than or equal to 13 seconds to demosntrate BLE strength improvements and reduce fall risk.    Time  8    Period  Weeks    Status  New            Plan - 11/04/17 1044    Clinical Impression Statement  Patient is making gradual progress in therapy and has progressed LE strengthening and balance training. He continues to require cues to prevent hip external rotation and compensation of adductors for hip flexion during functional exercises. He also remains limited by poor sequencing of reciprocal LE patterns and requires verbal/visual/tactile cues. He continues to require Min A with balance  activities for safety and tends to have posterior LOB during activities. Patient will continue to benefit from skilled PT services to address current impairments, decrease fall risk, and improve independence with functional mobility.    Rehab Potential  Good    PT Frequency  2x / week    PT Duration  8 weeks    PT Treatment/Interventions  ADLs/Self Care Home Management;Electrical Stimulation;DME Instruction;Gait training;Stair training;Functional mobility training;Therapeutic activities;Therapeutic exercise;Balance training;Neuromuscular re-education;Patient/family education;Manual techniques;Passive range of motion;Energy conservation;Visual/perceptual remediation/compensation    PT Next Visit Plan  Continue heel/toe raises (try with seated theraband next session) and abduction, STS and SLS activities (cone taps) and static balance activities.  Initiate dynamic gait training with dual task and balance activities for SLS, and tandem. Continue with NuStep for recipricol step pattern and progresst o gait traininer when safe.    PT Home Exercise Plan  marching, tandem stance and sit to stand    Consulted and Agree with Plan of Care  Patient       Patient will benefit from skilled therapeutic intervention in order to improve the following deficits and impairments:  Abnormal gait, Decreased knowledge of use of DME, Impaired sensation, Improper body mechanics, Decreased mobility, Decreased activity tolerance, Decreased endurance, Decreased strength, Decreased balance, Difficulty walking, Impaired vision/preception  Visit Diagnosis: Left middle cerebral artery stroke (HCC)  Other abnormalities of gait and mobility  Unsteadiness on feet  History of falling     Problem List Patient Active Problem List   Diagnosis Date Noted  . Gait disturbance, post-stroke 11/02/2017  . Aphasia as late effect of cerebrovascular accident 11/02/2017  . Left middle cerebral artery stroke ([place]) 10/07/2017  .  Aphasia   . PAF (paroxysmal atrial fibrillation) ([place])   . Diabetes mellitus type 2 in obese ([place])   . Benign essential HTN   . Hypothyroidism   . Hyperlipidemia   . Stroke (cerebrum) ([place]) 10/02/2017  . Afib ([place]) 01/10/2017  . SOB (shortness of breath) 01/10/2017  . Acute diastolic CHF (congestive heart failure) ([place]) 01/10/2017  . Atrial fibrillation with RVR ([place]) 01/10/2017  . Claudication ([place]) 07/25/2016  . Paroxysmal atrial fibrillation ([place]) 07/25/2016  . Odynophagia 04/10/2014  . Allergic rhinitis 03/23/2014  . Chronic rhinitis 12/22/2013  . 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 Constipation 08/22/2011  . Obesity 09/20/2009  . G E R D 11/29/2007  . Celiac disease 11/29/2007    Kipp Brood, PT, DPT Physical Therapist with [facility]  11/04/2017 10:51 AM    [place], [place], [address] Phone: [phone]   Fax:  [phone]  Name: [name] MRN: [id number] Date of Birth: 08-20-1929

## 2017-11-05 ENCOUNTER — Telehealth (HOSPITAL_COMMUNITY): Payer: Self-pay | Admitting: Physical Therapy

## 2017-11-05 ENCOUNTER — Telehealth (HOSPITAL_COMMUNITY): Payer: Self-pay

## 2017-11-05 NOTE — Telephone Encounter (Signed)
I attempted to call Mr. Cobbins to discuss how he is feeling today since his therapy session yesterday. His wife called our office yesterday concerned about Mr. Stoiber groin pain and asked that I call back to check in with them and note this in the chart. The home phone number and mobile phone number both gave busy signals when I called and I will attempt calling again later today to check in with the patient.  Kipp Brood, PT, DPT Physical Therapist with [facility]  11/05/2017 11:34 AM

## 2017-11-05 NOTE — Telephone Encounter (Signed)
He is having difficulty walking around in the home and his wife states he wants to cx this apptment.

## 2017-11-06 ENCOUNTER — Ambulatory Visit: Payer: PPO

## 2017-11-06 ENCOUNTER — Ambulatory Visit (HOSPITAL_COMMUNITY): Payer: PPO | Admitting: Physical Therapy

## 2017-11-06 ENCOUNTER — Other Ambulatory Visit: Payer: Self-pay

## 2017-11-06 ENCOUNTER — Inpatient Hospital Stay: Payer: PPO | Admitting: Physical Medicine & Rehabilitation

## 2017-11-06 NOTE — Patient Outreach (Signed)
[facility]) Care Management  11/06/2017  [name] Mar 07, 1929 [id number]   Transition of care  Referral date:10/20/17 Referral source:discharged from [facility] health rehab 10/16/17 Insurance:[employer] team advantage Attempt #3  Telephone call to patient regarding transition of care follow up. Unable to reach patient. HIPAA compliant voice message left with call back phone number.   PLAN: RNCM will send outreach letter to attempt contact.    Quinn Plowman RN,BSN,CCM [facility] Telephonic  [phone]

## 2017-11-10 ENCOUNTER — Encounter (HOSPITAL_COMMUNITY): Payer: Self-pay

## 2017-11-10 ENCOUNTER — Ambulatory Visit (HOSPITAL_COMMUNITY): Payer: PPO

## 2017-11-10 DIAGNOSIS — R2681 Unsteadiness on feet: Secondary | ICD-10-CM

## 2017-11-10 DIAGNOSIS — R2689 Other abnormalities of gait and mobility: Secondary | ICD-10-CM

## 2017-11-10 DIAGNOSIS — Z9181 History of falling: Secondary | ICD-10-CM

## 2017-11-10 DIAGNOSIS — R4701 Aphasia: Secondary | ICD-10-CM | POA: Diagnosis not present

## 2017-11-10 DIAGNOSIS — I63512 Cerebral infarction due to unspecified occlusion or stenosis of left middle cerebral artery: Secondary | ICD-10-CM

## 2017-11-10 NOTE — Therapy (Signed)
[place], [place], [address] Phone: [phone]   Fax:  [phone]  Physical Therapy Treatment  Patient Details  Name: [name] MRN: [id number] Date of Birth: September 05, 1929 Referring Provider: Alysia Penna, MD   Encounter Date: 11/10/2017  PT End of Session - 11/10/17 1031    Visit Number  5    Number of Visits  17    Date for PT Re-Evaluation  11/18/17    Authorization Type  [employer]    Authorization Time Period  10/21/17 - 12/16/17    PT Start Time  0948 4min on Nustep for sequence, not included with charges    PT Stop Time  1031    PT Time Calculation (min)  43 min    Equipment Utilized During Treatment  Gait belt    Activity Tolerance  Patient tolerated treatment well    Behavior During Therapy  [facility] for tasks assessed/performed       Past Medical History:  Diagnosis Date  . Allergic rhinitis   . Asthmatic bronchitis   . Atrial fibrillation ([place])   . Celiac disease   . Cellulitis   . Diverticulosis   . DM (diabetes mellitus) ([place])   . Fx. left wrist 1987  . Gastric polyps   . GERD (gastroesophageal reflux disease)   . History of pneumonia    w/pleural effusion  . Hypothyroidism   . Iron deficiency anemia   . Melanoma ([place])   . Neuropathy   . Prostate cancer ([place])   . REM sleep behavior disorder   . RLS (restless legs syndrome)   . Sleep apnea     Past Surgical History:  Procedure Laterality Date  . APPENDECTOMY    . CARPAL TUNNEL RELEASE    . CATARACT EXTRACTION  03/2011   bilateral   . CHOLECYSTECTOMY  2001  . COLONOSCOPY W/ BIOPSIES  04/27/2008   diverticulosis, duodenitis  . FOOT SURGERY    . HERNIA REPAIR  1994   bilateral  . KNEE SURGERY     x 2  . LUNG SURGERY  2001  . PROSTATECTOMY  2002  . UPPER GASTROINTESTINAL ENDOSCOPY  04/27/2008   celiac disease, gastric polyps    There were no vitals filed for this visit.  Subjective Assessment - 11/10/17 0953     Subjective  Pt reports he has been sore with BIl LE since last session.  Reports of fall in his bathroom, was able to get himself up due to smaller room and RW in room.      Patient Stated Goals  hopefully be able to walk and go to store and drive and all of those.     Currently in Pain?  Yes    Pain Score  5     Pain Location  Leg    Pain Orientation  Right;Left    Pain Descriptors / Indicators  Sore;Aching    Pain Type  Chronic pain    Pain Onset  More than a month ago    Pain Frequency  Constant    Aggravating Factors   walkikng, standing    Pain Relieving Factors  rest, unsure    Effect of Pain on Daily Activities  pushes through it                      [facility] Adult PT Treatment/Exercise - 11/10/17 0001      Knee/Hip Exercises: Aerobic   Nustep  UE/LE  recipricol movement, sequence and strengthening2x 2 minutes with rest therapeutic rest break, level 1      Knee/Hip Exercises: Seated   Other Seated Knee/Hip Exercises  heel raises  and toe raises; 1x 15 with 5 second holds each    Other Seated Knee/Hip Exercises  RTB DF and PF BLE 10x each (HEP)          Balance Exercises - 11/10/17 1020      Balance Exercises: Standing   Tandem Stance  Foam/compliant surface;Eyes open;30 secs;4 reps 5 reps punch with bar    SLS  5 reps cone taps; pain Rt LE SLS    Sidestepping  2 reps;Theraband GTB    Marching Limitations  2x 1 minute with foot taps on 4" step as patient has difficulty sequencing marching without visual/tactile cue    Sit to Stand Time  6x without HHA from chair height          PT Short Term Goals - 10/21/17 1848      PT SHORT TERM GOAL #1   Title  Patient will be independent with HEP and be able to demonstrate exercises to improve balance and strength with greater independence.    Time  2    Period  Weeks    Status  New    Target Date  11/04/17      PT SHORT TERM GOAL #2   Baseline  Patient will improve 3MWT by 100 feet to improve activity  tolerance and demonstrate safe gait velocity above 1.2 m/s to indicate reduced fall risk.    Time  4    Period  Weeks    Status  New    Target Date  11/18/17      PT SHORT TERM GOAL #3   Title  Patient will be able to state 4 modifiable risk factors for stroke and report efforts to incorporate risk reducation at home to decrease recurrent stroke risk.    Time  4    Period  Weeks    Status  New        PT Long Term Goals - 10/21/17 1851      PT LONG TERM GOAL #1   Title  Patient will improve distance with 3MWT by 150 feet and ambulate with LRAD at 1.2 m/s or faster to demonstrate safe gait velocity for community ambulation and reduced fall risk.    Time  8    Period  Weeks    Status  New    Target Date  12/16/17      PT LONG TERM GOAL #2   Title  Patient will improve TUG to less than or equal to 13 seconds to demosntrate reduced fall risk.    Time  8    Period  Weeks    Status  New      PT LONG TERM GOAL #3   Title  Patient will demosntrate Five Time Sit to Stand in less than or equal to 13 seconds to demosntrate BLE strength improvements and reduce fall risk.    Time  8    Period  Weeks    Status  New            Plan - 11/10/17 1241    Clinical Impression Statement  Began session on Nustep with additional UE to improve sequence and strengthening.  Pt was sore following last session and reports decreased compliance, pt educated on benefits of increasing compliance for maximal benefits.  Due to soreness alterated  seated and standing exercises for tolerance.  Added theraband with ankle strengthening with cueing to improve ankle mobility and hold time with new task.  Continued session focus iwth balance and strengthening with therapist facilition for proper form/technique and safety with balance activities.  No reports of increased pain thorugh session.    Rehab Potential  Good    PT Frequency  2x / week    PT Duration  8 weeks    PT Treatment/Interventions  ADLs/Self Care  Home Management;Electrical Stimulation;DME Instruction;Gait training;Stair training;Functional mobility training;Therapeutic activities;Therapeutic exercise;Balance training;Neuromuscular re-education;Patient/family education;Manual techniques;Passive range of motion;Energy conservation;Visual/perceptual remediation/compensation    PT Next Visit Plan  Continue heel/toe raises (try with seated theraband next session) and abduction, STS and SLS activities (cone taps) and static balance activities.  Initiate dynamic gait training with dual task and balance activities for SLS, and tandem. Continue with NuStep for recipricol step pattern and progresst o gait traininer when safe.       Patient will benefit from skilled therapeutic intervention in order to improve the following deficits and impairments:  Abnormal gait, Decreased knowledge of use of DME, Impaired sensation, Improper body mechanics, Decreased mobility, Decreased activity tolerance, Decreased endurance, Decreased strength, Decreased balance, Difficulty walking, Impaired vision/preception  Visit Diagnosis: History of falling  Unsteadiness on feet  Other abnormalities of gait and mobility  Left middle cerebral artery stroke [facility])     Problem List Patient Active Problem List   Diagnosis Date Noted  . Gait disturbance, post-stroke 11/02/2017  . Aphasia as late effect of cerebrovascular accident 11/02/2017  . 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 Aphasia   . PAF (paroxysmal atrial fibrillation) ([place])   . Diabetes mellitus type 2 in obese ([place])   . Benign essential HTN   . Hypothyroidism   . Hyperlipidemia   . Stroke (cerebrum) ([place]) 10/02/2017  . Afib ([place]) 01/10/2017  . SOB (shortness of breath) 01/10/2017  . Acute diastolic CHF (congestive heart failure) ([place]) 01/10/2017  . Atrial fibrillation with RVR ([place]) 01/10/2017  . Claudication ([place]) 07/25/2016  . Paroxysmal atrial fibrillation ([place]) 07/25/2016  .  Odynophagia 04/10/2014  . Allergic rhinitis 03/23/2014  . Chronic rhinitis 12/22/2013  . 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 Constipation 08/22/2011  . Obesity 09/20/2009  . G E R D 11/29/2007  . Celiac disease 11/29/2007   Ihor Austin, [place]; [place]  Aldona Lento 11/10/2017, 12:46 PM  [facility] [address] [place], [place], [address] Phone: [phone]   Fax:  [phone]  Name: JOSEPHANTHONY TINDEL MRN: [id number] Date of Birth: April 03, 1929

## 2017-11-10 NOTE — Patient Instructions (Signed)
Ankle Plantar Flexion: Long-Sitting (Single Leg)    Loop tubing around foot of straight leg, anchor with one hand. Leg straight, point toes downward. Repeat 10-15 times per set. Repeat with other leg. Do 2  sets per session  http://tub.exer.us/215   Copyright  VHI. All rights reserved.   Dorsiflexion (Eccentric), (Resistance Band)    Pull foot up against resistance band. Slowly release for 3-5 seconds. Use red resistance band. 15 reps per set, 2 sets per day.  http://ecce.exer.us/0   Copyright  VHI. All rights reserved.

## 2017-11-12 ENCOUNTER — Encounter (HOSPITAL_COMMUNITY): Payer: Self-pay | Admitting: Speech Pathology

## 2017-11-12 ENCOUNTER — Encounter (HOSPITAL_COMMUNITY): Payer: Self-pay

## 2017-11-12 ENCOUNTER — Ambulatory Visit (HOSPITAL_COMMUNITY): Payer: PPO | Admitting: Speech Pathology

## 2017-11-12 ENCOUNTER — Ambulatory Visit (HOSPITAL_COMMUNITY): Payer: PPO

## 2017-11-12 DIAGNOSIS — R41841 Cognitive communication deficit: Secondary | ICD-10-CM

## 2017-11-12 DIAGNOSIS — R2681 Unsteadiness on feet: Secondary | ICD-10-CM

## 2017-11-12 DIAGNOSIS — E119 Type 2 diabetes mellitus without complications: Secondary | ICD-10-CM | POA: Diagnosis not present

## 2017-11-12 DIAGNOSIS — R2689 Other abnormalities of gait and mobility: Secondary | ICD-10-CM

## 2017-11-12 DIAGNOSIS — Z9181 History of falling: Secondary | ICD-10-CM

## 2017-11-12 DIAGNOSIS — I63512 Cerebral infarction due to unspecified occlusion or stenosis of left middle cerebral artery: Secondary | ICD-10-CM

## 2017-11-12 DIAGNOSIS — R4701 Aphasia: Secondary | ICD-10-CM

## 2017-11-12 NOTE — Therapy (Signed)
[place], [place], [address] Phone: [phone]   Fax:  [phone]  Physical Therapy Treatment  Patient Details  Name: [name] MRN: [id number] Date of Birth: May 08, 1929 Referring Provider: Alysia Penna, MD   Encounter Date: 11/12/2017  PT End of Session - 11/12/17 1438    Visit Number  6    Number of Visits  17    Date for PT Re-Evaluation  11/18/17    Authorization Type  [employer]    Authorization Time Period  10/21/17 - 12/16/17    PT Start Time  1434 No charge x 6' on Nustep    PT Stop Time  1518    PT Time Calculation (min)  44 min    Equipment Utilized During Treatment  Gait belt    Activity Tolerance  Patient tolerated treatment well    Behavior During Therapy  [facility] for tasks assessed/performed       Past Medical History:  Diagnosis Date  . Allergic rhinitis   . Asthmatic bronchitis   . Atrial fibrillation ([place])   . Celiac disease   . Cellulitis   . Diverticulosis   . DM (diabetes mellitus) ([place])   . Fx. left wrist 1987  . Gastric polyps   . GERD (gastroesophageal reflux disease)   . History of pneumonia    w/pleural effusion  . Hypothyroidism   . Iron deficiency anemia   . Melanoma ([place])   . Neuropathy   . Prostate cancer ([place])   . REM sleep behavior disorder   . RLS (restless legs syndrome)   . Sleep apnea     Past Surgical History:  Procedure Laterality Date  . APPENDECTOMY    . CARPAL TUNNEL RELEASE    . CATARACT EXTRACTION  03/2011   bilateral   . CHOLECYSTECTOMY  2001  . COLONOSCOPY W/ BIOPSIES  04/27/2008   diverticulosis, duodenitis  . FOOT SURGERY    . HERNIA REPAIR  1994   bilateral  . KNEE SURGERY     x 2  . LUNG SURGERY  2001  . PROSTATECTOMY  2002  . UPPER GASTROINTESTINAL ENDOSCOPY  04/27/2008   celiac disease, gastric polyps    There were no vitals filed for this visit.  Subjective Assessment - 11/12/17 1437    Subjective  Pt stated he is  feeling good today, no reports of recent falls.      Patient Stated Goals  hopefully be able to walk and go to store and drive and all of those.     Currently in Pain?  No/denies                      [facility] Adult PT Treatment/Exercise - 11/12/17 0001      Knee/Hip Exercises: Aerobic   Nustep  UE/LE recipricol movement, sequence and strengthening2x 3 minutes with rest therapeutic rest break, level 1      Knee/Hip Exercises: Seated   Other Seated Knee/Hip Exercises  heel raises  and toe raises; 1x 15 with 5 second holds each          Balance Exercises - 11/12/17 1506      Balance Exercises: Standing   Tandem Stance  Eyes open;3 reps 3 sets with UE flexion then chest press: tandem balance bea    SLS  Eyes open;2 reps;30 secs SLS with 1 HHA and opposite foot on 6in step posterior    Sidestepping  2 reps;Theraband // bars  with GTB    Marching Limitations  20x alternating toe tapping to 6in step with minimal HHA    Sit to Stand Time  10x with HHA, min A          PT Short Term Goals - 10/21/17 1848      PT SHORT TERM GOAL #1   Title  Patient will be independent with HEP and be able to demonstrate exercises to improve balance and strength with greater independence.    Time  2    Period  Weeks    Status  New    Target Date  11/04/17      PT SHORT TERM GOAL #2   Baseline  Patient will improve 3MWT by 100 feet to improve activity tolerance and demonstrate safe gait velocity above 1.2 m/s to indicate reduced fall risk.    Time  4    Period  Weeks    Status  New    Target Date  11/18/17      PT SHORT TERM GOAL #3   Title  Patient will be able to state 4 modifiable risk factors for stroke and report efforts to incorporate risk reducation at home to decrease recurrent stroke risk.    Time  4    Period  Weeks    Status  New        PT Long Term Goals - 10/21/17 1851      PT LONG TERM GOAL #1   Title  Patient will improve distance with 3MWT by 150 feet and  ambulate with LRAD at 1.2 m/s or faster to demonstrate safe gait velocity for community ambulation and reduced fall risk.    Time  8    Period  Weeks    Status  New    Target Date  12/16/17      PT LONG TERM GOAL #2   Title  Patient will improve TUG to less than or equal to 13 seconds to demosntrate reduced fall risk.    Time  8    Period  Weeks    Status  New      PT LONG TERM GOAL #3   Title  Patient will demosntrate Five Time Sit to Stand in less than or equal to 13 seconds to demosntrate BLE strength improvements and reduce fall risk.    Time  8    Period  Weeks    Status  New            Plan - 11/12/17 1531    Clinical Impression Statement  Pt presents with improved awareness of posture requiring less cueing this session.  Improved activity tolerance with ability to complete 10 sit to stands prior fatigue, does continues to require cueing for mechanics and increased ease.  Pt continues to demonstrate instabilty with SLS activities and on dynamic surface required min to mod A with activity.  No reports of pain, was limited by fatigue required 3 seated rest breaks through session.      Rehab Potential  Good    PT Frequency  2x / week    PT Duration  8 weeks    PT Treatment/Interventions  ADLs/Self Care Home Management;Electrical Stimulation;DME Instruction;Gait training;Stair training;Functional mobility training;Therapeutic activities;Therapeutic exercise;Balance training;Neuromuscular re-education;Patient/family education;Manual techniques;Passive range of motion;Energy conservation;Visual/perceptual remediation/compensation    PT Next Visit Plan  Continue heel/toe raises (try with seated theraband next session) and abduction, STS and SLS activities (cone taps) and static balance activities.  Initiate dynamic gait training with dual  task and balance activities for SLS, and tandem. Continue with NuStep for recipricol step pattern and progresst o gait traininer when safe.    PT  Home Exercise Plan  marching, tandem stance and sit to stand; ankle movements wiht theraband       Patient will benefit from skilled therapeutic intervention in order to improve the following deficits and impairments:  Abnormal gait, Decreased knowledge of use of DME, Impaired sensation, Improper body mechanics, Decreased mobility, Decreased activity tolerance, Decreased endurance, Decreased strength, Decreased balance, Difficulty walking, Impaired vision/preception  Visit Diagnosis: History of falling  Unsteadiness on feet  Other abnormalities of gait and mobility  Left middle cerebral artery stroke [facility])     Problem List Patient Active Problem List   Diagnosis Date Noted  . Gait disturbance, post-stroke 11/02/2017  . Aphasia as late effect of cerebrovascular accident 11/02/2017  . 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 Aphasia   . PAF (paroxysmal atrial fibrillation) ([place])   . Diabetes mellitus type 2 in obese ([place])   . Benign essential HTN   . Hypothyroidism   . Hyperlipidemia   . Stroke (cerebrum) ([place]) 10/02/2017  . Afib ([place]) 01/10/2017  . SOB (shortness of breath) 01/10/2017  . Acute diastolic CHF (congestive heart failure) ([place]) 01/10/2017  . Atrial fibrillation with RVR ([place]) 01/10/2017  . Claudication ([place]) 07/25/2016  . Paroxysmal atrial fibrillation ([place]) 07/25/2016  . Odynophagia 04/10/2014  . Allergic rhinitis 03/23/2014  . Chronic rhinitis 12/22/2013  . 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 Constipation 08/22/2011  . Obesity 09/20/2009  . G E R D 11/29/2007  . Celiac disease 11/29/2007   Ihor Austin, [place]; [place]  Aldona Lento 11/12/2017, 3:51 PM  [place] [name] [place], [place], [address] Phone: [phone]   Fax:  [phone]  Name: [name] MRN: [id number] Date of Birth: October 24, 1928

## 2017-11-12 NOTE — Therapy (Signed)
[name], [place], [address] Phone: [phone]   Fax:  [phone]  Speech Language Pathology Treatment  Patient Details  Name: [name] MRN: [id number] Date of Birth: 1929-03-09 Referring Provider: Alysia Penna, MD   Encounter Date: 11/12/2017  End of Session - 11/12/17 1820    Visit Number  4    Number of Visits  9    Date for SLP Re-Evaluation  11/19/17    Authorization Type  [employer]    SLP Start Time  1    SLP Stop Time   1610    SLP Time Calculation (min)  42 min    Activity Tolerance  Patient tolerated treatment well       Past Medical History:  Diagnosis Date  . Allergic rhinitis   . Asthmatic bronchitis   . Atrial fibrillation ([place])   . Celiac disease   . Cellulitis   . Diverticulosis   . DM (diabetes mellitus) ([place])   . Fx. left wrist 1987  . Gastric polyps   . GERD (gastroesophageal reflux disease)   . History of pneumonia    w/pleural effusion  . Hypothyroidism   . Iron deficiency anemia   . Melanoma ([place])   . Neuropathy   . Prostate cancer ([place])   . REM sleep behavior disorder   . RLS (restless legs syndrome)   . Sleep apnea     Past Surgical History:  Procedure Laterality Date  . APPENDECTOMY    . CARPAL TUNNEL RELEASE    . CATARACT EXTRACTION  03/2011   bilateral   . CHOLECYSTECTOMY  2001  . COLONOSCOPY W/ BIOPSIES  04/27/2008   diverticulosis, duodenitis  . FOOT SURGERY    . HERNIA REPAIR  1994   bilateral  . KNEE SURGERY     x 2  . LUNG SURGERY  2001  . PROSTATECTOMY  2002  . UPPER GASTROINTESTINAL ENDOSCOPY  04/27/2008   celiac disease, gastric polyps    There were no vitals filed for this visit.  Subjective Assessment - 11/12/17 1817    Subjective  "I think I am doing better, but I'll know I am doing better when I can read my books at home."    Currently in Pain?  No/denies         ADULT SLP TREATMENT - 11/12/17 0001      General Information   Behavior/Cognition  Alert;Cooperative;Pleasant mood    Patient Positioning  Upright in chair    Oral care provided  N/A    HPI  [name] is an 84 yo male who presented to [facility] with stroke symptoms/aphasia. He was given tPA after head CT and transferred to [facility]. MRI showed acute left MCA territory infarct (temporal lobe). He eventually transferred to inpatient rehab before discharging home on 10/16/2017. He is referred to outpatient for ongoing SLP therapy to address cognitive communication changes with aphasia present. He is accompanied to today's evaluation by his wife, [name]. 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 He was driving and handled all finances at home. His wife is currently helping with both at this time. [name] hopes to improve his speech and be able to communicate better with people since his stroke.       Treatment Provided   Treatment provided  Cognitive-Linquistic      Pain Assessment   Pain Assessment  No/denies pain      Cognitive-Linquistic Treatment   Treatment focused on  Aphasia;Patient/family/caregiver education    Skilled Treatment  word finding strategies, word deduction, problem solving, oral reading and reading comprehension,       Assessment / Recommendations / Plan   Plan  Continue with current plan of care      Progression Toward Goals   Progression toward goals  Progressing toward goals       SLP Education - 11/12/17 1819    Education provided  Yes    Education Details  provided minimal pairs reading for pt to practice at home    Person(s) Educated  Patient    Methods  Explanation;Demonstration;Handout    Comprehension  Verbalized understanding       SLP Short Term Goals - 11/12/17 1820      SLP SHORT TERM GOAL #1   Title  Pt will implement word finding strategies with 90% accuracy when unable to verbalize desired word in  conversation/functional tasks with mi/mod assist.    Baseline  75%    Time  4    Period  Weeks    Status  On-going      SLP SHORT TERM GOAL #2   Title  Pt will complete functional check writing and balancing tasks with 100% acc with min assist and use of compensatory strategies as needed.    Baseline  Pt previously independent and not completing at this time since stroke    Time  4    Period  Weeks    Status  On-going      SLP SHORT TERM GOAL #3   Title  Pt will self-correct paraphasic errors 90% of the time during conversational speech with min cues.    Baseline  ~75% of the time    Time  4    Period  Weeks    Status  On-going      SLP SHORT TERM GOAL #4   Title  Pt will complete moderate-level thought organization and planning activities with 90% acc and min assist.    Baseline  mod assist    Time  4    Period  Weeks    Status  On-going      SLP SHORT TERM GOAL #5   Title  Pt will increase moderately complex divergent naming tasks to 7+ items with min assist and use of strategies as needed.     Baseline  ~5-8 concrete    Time  4    Period  Weeks    Status  On-going       SLP Long Term Goals - 11/12/17 1820      SLP LONG TERM GOAL #1   Title  Pt will express complex wants/needs to [facility] with use of compensatory strategies as needed.    Baseline  mi/mod impairment    Time  8    Period  Weeks    Status  On-going       Plan - 11/12/17 1820    Clinical Impression Statement Pt continues to make excellent progress toward SLP goals. He required min cues to identify paraphasic errors during oral reading of short paragraphs and min assist to self correct multisyllabic words. He generally only had difficulty with placing the correct emphasis on specific sounds (produce, coronary, inventory). He was given additional minimal pairs to practice reading at home aloud. Continue POC.    Speech Therapy Frequency  2x / week    Duration  4 weeks  Treatment/Interventions  SLP  instruction and feedback;Internal/external aids;Compensatory strategies;Compensatory techniques;Patient/family education;Functional tasks;Cueing hierarchy;Multimodal communcation approach    Potential to Achieve Goals  Good    SLP Home Exercise Plan  Pt will be independent with HEP as assigned to facilitate carryover of treatment strategies and techniques in home environment with assist from caregiver as needed.     Consulted and Agree with Plan of Care  Patient;Family member/caregiver    Family Member Consulted  Spouse, [name]       Patient will benefit from skilled therapeutic intervention in order to improve the following deficits and impairments:   Aphasia  Cognitive communication deficit    Problem List Patient Active Problem List   Diagnosis Date Noted  . Gait disturbance, post-stroke 11/02/2017  . Aphasia as late effect of cerebrovascular accident 11/02/2017  . 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 Aphasia   . PAF (paroxysmal atrial fibrillation) ([place])   . Diabetes mellitus type 2 in obese ([place])   . Benign essential HTN   . Hypothyroidism   . Hyperlipidemia   . Stroke (cerebrum) ([place]) 10/02/2017  . Afib ([place]) 01/10/2017  . SOB (shortness of breath) 01/10/2017  . Acute diastolic CHF (congestive heart failure) ([place]) 01/10/2017  . Atrial fibrillation with RVR ([place]) 01/10/2017  . Claudication ([place]) 07/25/2016  . Paroxysmal atrial fibrillation ([place]) 07/25/2016  . Odynophagia 04/10/2014  . Allergic rhinitis 03/23/2014  . Chronic rhinitis 12/22/2013  . 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 Constipation 08/22/2011  . Obesity 09/20/2009  . G E R D 11/29/2007  . Celiac disease 11/29/2007   Thank you,  Genene Churn, [place]  [facility] 11/12/2017, 6:21 PM  [place], [place], [address] Phone: [phone]   Fax:  [phone]   Name: BALEN WOOLUM MRN: [id number] Date  of Birth: [birth year]

## 2017-11-16 ENCOUNTER — Ambulatory Visit (HOSPITAL_COMMUNITY): Payer: PPO

## 2017-11-16 ENCOUNTER — Ambulatory Visit (HOSPITAL_COMMUNITY): Payer: PPO | Attending: Physical Medicine & Rehabilitation | Admitting: Speech Pathology

## 2017-11-16 ENCOUNTER — Encounter (HOSPITAL_COMMUNITY): Payer: Self-pay

## 2017-11-16 ENCOUNTER — Encounter (HOSPITAL_COMMUNITY): Payer: Self-pay | Admitting: Speech Pathology

## 2017-11-16 ENCOUNTER — Other Ambulatory Visit: Payer: Self-pay

## 2017-11-16 DIAGNOSIS — R41841 Cognitive communication deficit: Secondary | ICD-10-CM

## 2017-11-16 DIAGNOSIS — I63512 Cerebral infarction due to unspecified occlusion or stenosis of left middle cerebral artery: Secondary | ICD-10-CM

## 2017-11-16 DIAGNOSIS — Z9181 History of falling: Secondary | ICD-10-CM | POA: Insufficient documentation

## 2017-11-16 DIAGNOSIS — R4701 Aphasia: Secondary | ICD-10-CM | POA: Insufficient documentation

## 2017-11-16 DIAGNOSIS — R2681 Unsteadiness on feet: Secondary | ICD-10-CM | POA: Insufficient documentation

## 2017-11-16 DIAGNOSIS — R2689 Other abnormalities of gait and mobility: Secondary | ICD-10-CM | POA: Diagnosis not present

## 2017-11-16 NOTE — Therapy (Signed)
[place], [place], [address] Phone: [phone]   Fax:  [phone]  Speech Language Pathology Treatment  Patient Details  Name: [name] MRN: [id number] Date of Birth: 1929-08-02 Referring Provider: Alysia Penna, MD   Encounter Date: 11/16/2017  End of Session - 11/16/17 1714    Visit Number  5    Number of Visits  9    Date for SLP Re-Evaluation  11/19/17    Authorization Type  [employer]    SLP Start Time  7793    SLP Stop Time   1610    SLP Time Calculation (min)  52 min    Activity Tolerance  Patient tolerated treatment well       Past Medical History:  Diagnosis Date  . Allergic rhinitis   . Asthmatic bronchitis   . Atrial fibrillation ([place])   . Celiac disease   . Cellulitis   . Diverticulosis   . DM (diabetes mellitus) ([place])   . Fx. left wrist 1987  . Gastric polyps   . GERD (gastroesophageal reflux disease)   . History of pneumonia    w/pleural effusion  . Hypothyroidism   . Iron deficiency anemia   . Melanoma ([place])   . Neuropathy   . Prostate cancer ([place])   . REM sleep behavior disorder   . RLS (restless legs syndrome)   . Sleep apnea     Past Surgical History:  Procedure Laterality Date  . APPENDECTOMY    . CARPAL TUNNEL RELEASE    . CATARACT EXTRACTION  03/2011   bilateral   . CHOLECYSTECTOMY  2001  . COLONOSCOPY W/ BIOPSIES  04/27/2008   diverticulosis, duodenitis  . FOOT SURGERY    . HERNIA REPAIR  1994   bilateral  . KNEE SURGERY     x 2  . LUNG SURGERY  2001  . PROSTATECTOMY  2002  . UPPER GASTROINTESTINAL ENDOSCOPY  04/27/2008   celiac disease, gastric polyps    There were no vitals filed for this visit.  Subjective Assessment - 11/16/17 1708    Subjective  "I started to read a book over the weekend."    Currently in Pain?  No/denies        ADULT SLP TREATMENT - 11/16/17 0001      General Information   Behavior/Cognition   Alert;Cooperative;Pleasant mood    Patient Positioning  Upright in chair    Oral care provided  N/A    HPI  [name] is an 84 yo male who presented to [facility] ED with stroke symptoms/aphasia. He was given tPA after head CT and transferred to [facility]. MRI showed acute left MCA territory infarct (temporal lobe). He eventually transferred to inpatient rehab before discharging home on 10/16/2017. He is referred to outpatient for ongoing SLP therapy to address cognitive communication changes with aphasia present. He is accompanied to today's evaluation by his wife, [name]. 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 He was driving and handled all finances at home. His wife is currently helping with both at this time. [name] hopes to improve his speech and be able to communicate better with people since his stroke.       Treatment Provided   Treatment provided  Cognitive-Linquistic      Pain Assessment   Pain Assessment  No/denies pain      Cognitive-Linquistic Treatment   Treatment  focused on  Aphasia;Patient/family/caregiver education    Skilled Treatment  word finding strategies, word deduction, problem solving, oral reading and reading comprehension,       Assessment / Recommendations / Plan   Plan  Continue with current plan of care         SLP Short Term Goals - 11/16/17 1714      SLP SHORT TERM GOAL #1   Title  Pt will implement word finding strategies with 90% accuracy when unable to verbalize desired word in conversation/functional tasks with mi/mod assist.    Baseline  75%    Time  4    Period  Weeks    Status  On-going      SLP SHORT TERM GOAL #2   Title  Pt will complete functional check writing and balancing tasks with 100% acc with min assist and use of compensatory strategies as needed.    Baseline  Pt previously independent and not completing at this time since stroke    Time  4    Period  Weeks     Status  On-going      SLP SHORT TERM GOAL #3   Title  Pt will self-correct paraphasic errors 90% of the time during conversational speech with min cues.    Baseline  ~75% of the time    Time  4    Period  Weeks    Status  On-going      SLP SHORT TERM GOAL #4   Title  Pt will complete moderate-level thought organization and planning activities with 90% acc and min assist.    Baseline  mod assist    Time  4    Period  Weeks    Status  On-going      SLP SHORT TERM GOAL #5   Title  Pt will increase moderately complex divergent naming tasks to 7+ items with min assist and use of strategies as needed.     Baseline  ~5-8 concrete    Time  4    Period  Weeks    Status  On-going       SLP Long Term Goals - 11/16/17 1715      SLP LONG TERM GOAL #1   Title  Pt will express complex wants/needs to [facility] with use of compensatory strategies as needed.    Baseline  mi/mod impairment    Time  8    Period  Weeks    Status  On-going       Plan - 11/16/17 1714    Clinical Impression Statement Pt continues to make excellent progress toward goals. He completed home program and even started reading a short mystery novel. He reports that he has found it much easier to read silently rather than reading aloud. He provided verbal summary of [facility] article with min cues and self corrected paraphasic errors 90% of the time today. He completed divergent naming tasks with 100% acc with min cues. Pt reports that he has resumed management over check writing at home. Progress reviewed with Pt and spouse. Next session, plan to complete synonym and antonym activity with patient and writing to dictation.    Speech Therapy Frequency  2x / week    Duration  4 weeks    Treatment/Interventions  SLP instruction and feedback;Internal/external aids;Compensatory strategies;Compensatory techniques;Patient/family education;Functional tasks;Cueing hierarchy;Multimodal communcation approach    Potential to Achieve Goals   Good    SLP Home Exercise Plan  Pt will be independent with HEP  as assigned to facilitate carryover of treatment strategies and techniques in home environment with assist from caregiver as needed.     Consulted and Agree with Plan of Care  Patient;Family member/caregiver    Family Member Consulted  Spouse, [name]       Patient will benefit from skilled therapeutic intervention in order to improve the following deficits and impairments:   Cognitive communication deficit  Aphasia    Problem List Patient Active Problem List   Diagnosis Date Noted  . Gait disturbance, post-stroke 11/02/2017  . Aphasia as late effect of cerebrovascular accident 11/02/2017  . 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 Aphasia   . PAF (paroxysmal atrial fibrillation) ([place])   . Diabetes mellitus type 2 in obese ([place])   . Benign essential HTN   . Hypothyroidism   . Hyperlipidemia   . Stroke (cerebrum) ([place]) 10/02/2017  . Afib ([place]) 01/10/2017  . SOB (shortness of breath) 01/10/2017  . Acute diastolic CHF (congestive heart failure) ([place]) 01/10/2017  . Atrial fibrillation with RVR ([place]) 01/10/2017  . Claudication ([place]) 07/25/2016  . Paroxysmal atrial fibrillation ([place]) 07/25/2016  . Odynophagia 04/10/2014  . Allergic rhinitis 03/23/2014  . Chronic rhinitis 12/22/2013  . 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 Constipation 08/22/2011  . Obesity 09/20/2009  . G E R D 11/29/2007  . Celiac disease 11/29/2007   Thank you,  Genene Churn, [place]  [facility] 11/16/2017, 5:17 PM  [place] [address] [place], [place], [address] Phone: [phone]   Fax:  [phone]   Name: [name] MRN: [id number] Date of Birth: 1929-08-26

## 2017-11-16 NOTE — Therapy (Signed)
[place], [place], [address] Phone: [phone]   Fax:  [phone]  Physical Therapy Treatment/Re-Assessment  Patient Details  Name: [name] MRN: [id number] Date of Birth: [birth year] Referring Provider: Alysia Penna, MD   Encounter Date: 11/16/2017  PT End of Session - 11/16/17 1624    Visit Number  7    Number of Visits  17    Date for PT Re-Evaluation  12/16/17    Authorization Type  [employer]    Authorization Time Period  10/21/17 - 12/16/17    PT Start Time  1435    PT Stop Time  1514    PT Time Calculation (min)  39 min    Equipment Utilized During Treatment  Gait belt    Activity Tolerance  Patient tolerated treatment well    Behavior During Therapy  [facility] for tasks assessed/performed       Past Medical History:  Diagnosis Date  . Allergic rhinitis   . Asthmatic bronchitis   . Atrial fibrillation ([place])   . Celiac disease   . Cellulitis   . Diverticulosis   . DM (diabetes mellitus) ([place])   . Fx. left wrist 1987  . Gastric polyps   . GERD (gastroesophageal reflux disease)   . History of pneumonia    w/pleural effusion  . Hypothyroidism   . Iron deficiency anemia   . Melanoma ([place])   . Neuropathy   . Prostate cancer ([place])   . REM sleep behavior disorder   . RLS (restless legs syndrome)   . Sleep apnea     Past Surgical History:  Procedure Laterality Date  . APPENDECTOMY    . CARPAL TUNNEL RELEASE    . CATARACT EXTRACTION  03/2011   bilateral   . CHOLECYSTECTOMY  2001  . COLONOSCOPY W/ BIOPSIES  04/27/2008   diverticulosis, duodenitis  . FOOT SURGERY    . HERNIA REPAIR  1994   bilateral  . KNEE SURGERY     x 2  . LUNG SURGERY  2001  . PROSTATECTOMY  2002  . UPPER GASTROINTESTINAL ENDOSCOPY  04/27/2008   celiac disease, gastric polyps    There were no vitals filed for this visit.  Subjective Assessment - 11/16/17 1621    Subjective  Patient reports he feels ok  today. He states he lost his balance in a restroom last Friday and that he was able to support himself against the wall and prevent himself from hitting the ground. He reports he has been doing his exercises every day at least 1x.    Limitations  Standing;Walking;House hold activities    How long can you sit comfortably?  unlimited    How long can you stand comfortably?  needs RW, 25 minutes easy    How long can you walk comfortably?  household distance    Patient Stated Goals  hopefully be able to walk and go to store and drive and all of those.     Currently in Pain?  No/denies         [facility] PT Assessment - 11/16/17 0001      Assessment   Medical Diagnosis  L MCA CVA    Referring Provider  Alysia Penna, MD    Onset Date/Surgical Date  10/02/17    Hand Dominance  Right    Next MD Visit  Sometime later this month    Prior Therapy  [facility] PT, OT, ST      Precautions  Precautions  Fall      Balance Screen   Has the patient fallen in the past 6 months  Yes    How many times?  fell this past Friday in restroom, patient suported himself against the wall and avoided fall to floor, denies injuries    Has the patient had a decrease in activity level because of a fear of falling?   Yes    Is the patient reluctant to leave their home because of a fear of falling?   Yes      Cognition   Overall Cognitive Status  Impaired/Different from baseline    Area of Impairment  Memory      Observation/Other Assessments   Focus on Therapeutic Outcomes (FOTO)   -- 55% limited at initial eval, complete next session      Sensation   Light Touch  Impaired by gross assessment    Additional Comments  impaired light touch along plantar surface of feet      Functional Tests   Functional tests  Single leg stance      Single Leg Stance   Comments  unable to perform Bil LE      Strength   Overall Strength Comments  BUE strength is 5/5    Right Hip Flexion  4-/5 was 4/5; (pain from his recent fall)     Left Hip Flexion  5/5 was 5/5    Right Knee Flexion  5/5 was 4+/5    Right Knee Extension  5/5 was 4+/5    Left Knee Flexion  5/5 was 5/5    Left Knee Extension  5/5 was 5/5    Right Ankle Dorsiflexion  3/5 was 3/5    Right Ankle Plantar Flexion  3/5 was 3/5    Left Ankle Dorsiflexion  3/5 was 3/5    Left Ankle Plantar Flexion  3/5 was 3/5      Bed Mobility   Bed Mobility  Sit to Supine;Supine to Sit    Supine to Sit  6: Modified independent (Device/Increase time) requires minimal effort    Sit to Supine  6: Modified independent (Device/Increase time) requires minimal effort      Ambulation/Gait   Ambulation/Gait  Yes    Ambulation Distance (Feet)  352 Feet 3MWT    Assistive device  Rolling walker    Gait Pattern  Step-through pattern;Decreased stride length;Decreased hip/knee flexion - left;Decreased hip/knee flexion - right;Decreased dorsiflexion - left;Decreased dorsiflexion - right;Left foot flat;Right foot flat;Trunk flexed;Wide base of support;Trendelenburg R hip externaly rotated    Ambulation Surface  Level    Gait velocity  0.6 m/s was 0.82 m/s on 10/21/17    Gait Comments  patient ambulating with BLE externally rotated using adductor for hip flexion to advance BLE      Berg Balance Test   Sit to Stand  Able to stand using hands after several tries    Standing Unsupported  Able to stand 2 minutes with supervision    Sitting with Back Unsupported but Feet Supported on Floor or Stool  Able to sit safely and securely 2 minutes    Stand to Sit  Uses backs of legs against chair to control descent    Transfers  Able to transfer safely, definite need of hands    Standing Unsupported with Eyes Closed  Able to stand 10 seconds with supervision    Standing Ubsupported with Feet Together  Able to place feet together independently and stand for 1 minute with supervision  From Standing, Reach Forward with Outstretched Arm  Can reach forward >5 cm safely (2") supervision and use of  trunk hip compensation    From Standing Position, Pick up Object from [place] to pick up shoe, needs supervision    From Standing Position, Turn to Look Behind Over each Shoulder  Needs supervision when turning    Turn 360 Degrees  Able to turn 360 degrees safely but slowly    Standing Unsupported, Alternately Place Feet on Step/Stool  Needs assistance to keep from falling or unable to try    Standing Unsupported, One Foot in [employer] balance while stepping or standing    Standing on One Leg  Tries to lift leg/unable to hold 3 seconds but remains standing independently    Total Score  29    Berg comment:  was 25/56 on 10/21/17. Patient remains at severe risk of falling      Timed Up and Go Test   TUG  Normal TUG    Normal TUG (seconds)  18.04 was 21.5    TUG Comments  time of 18.04 seconds with RW is indicitive of fall risk        [facility] Adult PT Treatment/Exercise - 11/16/17 0001      Transfers   Comments  2x 10 reps sit<>stands with bil UE use on thigh. Verbal/tactiel cues for forward trunk lean, and to detern posterior weight shift, cues to deter use of back of legs to balance against chair      Knee/Hip Exercises: Standing   Forward Step Up  Both;15 reps;Hand Hold: 1;Step Height: 4" patient has improved carryover to recognize sequencing error       Balance Exercises - 11/16/17 1629      Balance Exercises: Standing   Tandem Stance  Eyes open;2 reps;10 secs    Tandem Gait  1 rep;Forward;Retro 15 feet        PT Education - 11/16/17 1623    Education provided  Yes    Education Details  Educated on log roll technique and safe sequencing of sit to stand transfers. Reviewed progress towards goals and current impairments to address.     Person(s) Educated  Patient    Methods  Explanation;Demonstration    Comprehension  Verbalized understanding;Returned demonstration;Need further instruction       PT Short Term Goals - 11/16/17 1449      PT SHORT TERM GOAL #1   Title   Patient will be independent with HEP and be able to demonstrate exercises to improve balance and strength with greater independence.    Baseline  11/16/17 - at least 1x/day    Time  2    Period  Weeks    Status  Achieved      PT SHORT TERM GOAL #2   Title  Patient will improve 3MWT by 100 feet to improve activity tolerance and demonstrate safe gait velocity above 1.2 m/s to indicate reduced fall risk.    Baseline  11/16/17 - 352 feet today (0.6 m/s) patient ambulated 490 feet at eval    Time  4    Period  Weeks    Status  On-going      PT SHORT TERM GOAL #3   Title  Patient will be able to state 4 modifiable risk factors for stroke and report efforts to incorporate risk reducation at home to decrease recurrent stroke risk.    Baseline  11/16/17 - patient requires review and further education  Time  4    Period  Weeks    Status  On-going        PT Long Term Goals - 11/16/17 1627      PT LONG TERM GOAL #1   Title  Patient will improve distance with 3MWT by 150 feet and ambulate with LRAD at 1.2 m/s or faster to demonstrate safe gait velocity for community ambulation and reduced fall risk.    Baseline  11/16/17 - 352 feet today (0.6 m/s) patient ambulated 490 feet at eval    Time  8    Period  Weeks    Status  On-going      PT LONG TERM GOAL #2   Title  Patient will improve TUG to less than or equal to 13 seconds to demosntrate reduced fall risk.    Baseline  11/16/17 - 18.04 seconds with RW    Time  8    Period  Weeks    Status  On-going      PT LONG TERM GOAL #3   Title  Patient will demosntrate Five Time Sit to Stand in less than or equal to 13 seconds to demosntrate BLE strength improvements and reduce fall risk.    Baseline  11/16/17 - patient requires cues for safe sequencing    Time  8    Period  Weeks    Status  On-going          Plan - 11/16/17 1630    Clinical Impression Statement  Re-Assessment performed today and patient has met 1/3 short term and long term goals.  He is gradually progressing and demonstrated improvements in muscle strength and balance with an increase of 4 points in the [place] Balance scale. He continues to remain a fall risk and reported a recent loss of balance this past week but denied injuries. He remains limited by poor sequencing of requires verbal/visual/tactile cues for safe transfers. He continues to require Min A with balance activities for safety and tends to have posterior LOB during activities. Mr. Blann will continue to benefit from skilled PT services to address current impairments, decrease fall risk, and improve independence with functional mobility.    Rehab Potential  Good    PT Frequency  2x / week    PT Duration  8 weeks    PT Treatment/Interventions  ADLs/Self Care Home Management;Electrical Stimulation;DME Instruction;Gait training;Stair training;Functional mobility training;Therapeutic activities;Therapeutic exercise;Balance training;Neuromuscular re-education;Patient/family education;Manual techniques;Passive range of motion;Energy conservation;Visual/perceptual remediation/compensation    PT Next Visit Plan  Continue heel/toe raises (try with seated theraband next session) and abduction, STS and SLS activities (cone taps) and static balance activities.  Initiate dynamic gait training with dual task and balance activities for SLS, and tandem. Initiate gait trainer and balance master/biodex next session.    PT Home Exercise Plan  marching, tandem stance and sit to stand; ankle movements wiht theraband    Consulted and Agree with Plan of Care  Patient       Patient will benefit from skilled therapeutic intervention in order to improve the following deficits and impairments:  Abnormal gait, Decreased knowledge of use of DME, Impaired sensation, Improper body mechanics, Decreased mobility, Decreased activity tolerance, Decreased endurance, Decreased strength, Decreased balance, Difficulty walking, Impaired  vision/preception  Visit Diagnosis: Left middle cerebral artery stroke (HCC)  Other abnormalities of gait and mobility  Unsteadiness on feet  History of falling     Problem List Patient Active Problem List   Diagnosis Date Noted  . Gait  disturbance, post-stroke 11/02/2017  . Aphasia as late effect of cerebrovascular accident 11/02/2017  . 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 Aphasia   . PAF (paroxysmal atrial fibrillation) ([place])   . Diabetes mellitus type 2 in obese ([place])   . Benign essential HTN   . Hypothyroidism   . Hyperlipidemia   . Stroke (cerebrum) ([place]) 10/02/2017  . Afib ([place]) 01/10/2017  . SOB (shortness of breath) 01/10/2017  . Acute diastolic CHF (congestive heart failure) ([place]) 01/10/2017  . Atrial fibrillation with RVR ([place]) 01/10/2017  . Claudication ([place]) 07/25/2016  . Paroxysmal atrial fibrillation ([place]) 07/25/2016  . Odynophagia 04/10/2014  . Allergic rhinitis 03/23/2014  . Chronic rhinitis 12/22/2013  . 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 Constipation 08/22/2011  . Obesity 09/20/2009  . G E R D 11/29/2007  . Celiac disease 11/29/2007    [name], PT, DPT Physical Therapist with [facility]  11/16/2017 4:34 PM    [name] [address] [place], [place], [address] Phone: [phone]   Fax:  [phone]  Name: [name] MRN: [id number] Date of Birth: [birth year]

## 2017-11-18 ENCOUNTER — Encounter (HOSPITAL_COMMUNITY): Payer: Self-pay | Admitting: Speech Pathology

## 2017-11-18 ENCOUNTER — Encounter (HOSPITAL_COMMUNITY): Payer: Self-pay

## 2017-11-18 ENCOUNTER — Ambulatory Visit (HOSPITAL_COMMUNITY): Payer: PPO | Admitting: Speech Pathology

## 2017-11-18 ENCOUNTER — Ambulatory Visit (HOSPITAL_COMMUNITY): Payer: PPO

## 2017-11-18 ENCOUNTER — Other Ambulatory Visit: Payer: Self-pay

## 2017-11-18 DIAGNOSIS — R41841 Cognitive communication deficit: Secondary | ICD-10-CM | POA: Diagnosis not present

## 2017-11-18 DIAGNOSIS — R4701 Aphasia: Secondary | ICD-10-CM

## 2017-11-18 DIAGNOSIS — R2681 Unsteadiness on feet: Secondary | ICD-10-CM

## 2017-11-18 DIAGNOSIS — I63512 Cerebral infarction due to unspecified occlusion or stenosis of left middle cerebral artery: Secondary | ICD-10-CM

## 2017-11-18 DIAGNOSIS — Z9181 History of falling: Secondary | ICD-10-CM

## 2017-11-18 DIAGNOSIS — R2689 Other abnormalities of gait and mobility: Secondary | ICD-10-CM

## 2017-11-18 NOTE — Therapy (Signed)
[place] [address] [place], [place], [address] Phone: [phone]   Fax:  [phone]  Physical Therapy Treatment  Patient Details  Name: [name] MRN: [id number] Date of Birth: 29-Sep-1929 Referring Provider: Alysia Penna, MD   Encounter Date: 11/18/2017  PT End of Session - 11/18/17 1037    Visit Number  8    Number of Visits  17    Date for PT Re-Evaluation  12/16/17    Authorization Type  [employer]    Authorization Time Period  10/21/17 - 12/16/17    PT Start Time  1034    PT Stop Time  1112    PT Time Calculation (min)  38 min    Equipment Utilized During Treatment  Gait belt    Activity Tolerance  Patient tolerated treatment well    Behavior During Therapy  [facility] for tasks assessed/performed       Past Medical History:  Diagnosis Date  . Allergic rhinitis   . Asthmatic bronchitis   . Atrial fibrillation ([place])   . Celiac disease   . Cellulitis   . Diverticulosis   . DM (diabetes mellitus) ([place])   . Fx. left wrist 1987  . Gastric polyps   . GERD (gastroesophageal reflux disease)   . History of pneumonia    w/pleural effusion  . Hypothyroidism   . Iron deficiency anemia   . Melanoma ([place])   . Neuropathy   . Prostate cancer ([place])   . REM sleep behavior disorder   . RLS (restless legs syndrome)   . Sleep apnea     Past Surgical History:  Procedure Laterality Date  . APPENDECTOMY    . CARPAL TUNNEL RELEASE    . CATARACT EXTRACTION  03/2011   bilateral   . CHOLECYSTECTOMY  2001  . COLONOSCOPY W/ BIOPSIES  04/27/2008   diverticulosis, duodenitis  . FOOT SURGERY    . HERNIA REPAIR  1994   bilateral  . KNEE SURGERY     x 2  . LUNG SURGERY  2001  . PROSTATECTOMY  2002  . UPPER GASTROINTESTINAL ENDOSCOPY  04/27/2008   celiac disease, gastric polyps    There were no vitals filed for this visit.  Subjective Assessment - 11/18/17 1036    Subjective  Pt stated he is feeling good today, no  reports of pain or recent fall.    Patient Stated Goals  hopefully be able to walk and go to store and drive and all of those.     Currently in Pain?  No/denies                      [facility] Adult PT Treatment/Exercise - 11/18/17 0001      Knee/Hip Exercises: Aerobic   [place]  Gait trainer range from .6--.8 mph with max cueing to increase stride length 2x 1 minute      Knee/Hip Exercises: Standing   Forward Step Up  Both;15 reps;Hand Hold: 1;Step Height: 6"          Balance Exercises - 11/18/17 1048      Balance Exercises: Standing   Tandem Stance  Eyes open;2 reps;10 secs    SLS  5 reps cone taps with 1 HHA    Tandem Gait  1 rep;Forward;Retro with RW    Retro Gait  1 rep with RW    Sidestepping  3 reps;Theraband front of mat with GTB    Sit to Stand Time  10x with HHA, min A    Other Standing Exercises  picking up cones from floor 5x           PT Short Term Goals - 11/16/17 1449      PT SHORT TERM GOAL #1   Title  Patient will be independent with HEP and be able to demonstrate exercises to improve balance and strength with greater independence.    Baseline  11/16/17 - at least 1x/day    Time  2    Period  Weeks    Status  Achieved      PT SHORT TERM GOAL #2   Title  Patient will improve 3MWT by 100 feet to improve activity tolerance and demonstrate safe gait velocity above 1.2 m/s to indicate reduced fall risk.    Baseline  11/16/17 - 352 feet today (0.6 m/s) patient ambulated 490 feet at eval    Time  4    Period  Weeks    Status  On-going      PT SHORT TERM GOAL #3   Title  Patient will be able to state 4 modifiable risk factors for stroke and report efforts to incorporate risk reducation at home to decrease recurrent stroke risk.    Baseline  11/16/17 - patient requires review and further education    Time  4    Period  Weeks    Status  On-going        PT Long Term Goals - 11/16/17 1627      PT LONG TERM GOAL #1   Title  Patient will  improve distance with 3MWT by 150 feet and ambulate with LRAD at 1.2 m/s or faster to demonstrate safe gait velocity for community ambulation and reduced fall risk.    Baseline  11/16/17 - 352 feet today (0.6 m/s) patient ambulated 490 feet at eval    Time  8    Period  Weeks    Status  On-going      PT LONG TERM GOAL #2   Title  Patient will improve TUG to less than or equal to 13 seconds to demosntrate reduced fall risk.    Baseline  11/16/17 - 18.04 seconds with RW    Time  8    Period  Weeks    Status  On-going      PT LONG TERM GOAL #3   Title  Patient will demosntrate Five Time Sit to Stand in less than or equal to 13 seconds to demosntrate BLE strength improvements and reduce fall risk.    Baseline  11/16/17 - patient requires cues for safe sequencing    Time  8    Period  Weeks    Status  On-going            Plan - 11/18/17 1213    Clinical Impression Statement  Continued session focus with balance training and functional strengthening to improve gait mechanics.  Began gait trainor with constant cueing to advanced LE to reduce risk of falls, pt limited by fatigue and SOB with new activity and required rest breaks every minute.  Continues to require min A with balance activities to reduce risk of fall especially with SLS activities.  Pt was able to pick up cones from floor but prolonged period of time and decreased confidence with task.  EOS pt limited by fatigue, no reports of pain through session.    Rehab Potential  Good    PT Frequency  2x / week  PT Duration  8 weeks    PT Treatment/Interventions  ADLs/Self Care Home Management;Electrical Stimulation;DME Instruction;Gait training;Stair training;Functional mobility training;Therapeutic activities;Therapeutic exercise;Balance training;Neuromuscular re-education;Patient/family education;Manual techniques;Passive range of motion;Energy conservation;Visual/perceptual remediation/compensation    PT Next Visit Plan  Continue  heel/toe raises (try with seated theraband next session) and abduction, STS and SLS activities (cone taps) and static balance activities.  Initiate dynamic gait training with dual task and balance activities for SLS, and tandem. Initiate gait trainer and balance master/biodex next session.    PT Home Exercise Plan  marching, tandem stance and sit to stand; ankle movements wiht theraband       Patient will benefit from skilled therapeutic intervention in order to improve the following deficits and impairments:  Abnormal gait, Decreased knowledge of use of DME, Impaired sensation, Improper body mechanics, Decreased mobility, Decreased activity tolerance, Decreased endurance, Decreased strength, Decreased balance, Difficulty walking, Impaired vision/preception  Visit Diagnosis: Left middle cerebral artery stroke (HCC)  Other abnormalities of gait and mobility  Unsteadiness on feet  History of falling     Problem List Patient Active Problem List   Diagnosis Date Noted  . Gait disturbance, post-stroke 11/02/2017  . Aphasia as late effect of cerebrovascular accident 11/02/2017  . Left middle cerebral artery stroke ([place]) 10/07/2017  . Aphasia   . PAF (paroxysmal atrial fibrillation) ([place])   . Diabetes mellitus type 2 in obese ([place])   . Benign essential HTN   . Hypothyroidism   . Hyperlipidemia   . Stroke (cerebrum) ([place]) 10/02/2017  . Afib ([place]) 01/10/2017  . SOB (shortness of breath) 01/10/2017  . Acute diastolic CHF (congestive heart failure) ([place]) 01/10/2017  . Atrial fibrillation with RVR ([place]) 01/10/2017  . Claudication ([place]) 07/25/2016  . Paroxysmal atrial fibrillation ([place]) 07/25/2016  . Odynophagia 04/10/2014  . Allergic rhinitis 03/23/2014  . Chronic rhinitis 12/22/2013  . 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 Constipation 08/22/2011  . Obesity 09/20/2009  . G E R D 11/29/2007  . Celiac disease 11/29/2007   Ihor Austin, [place]; [place]  Aldona Lento 11/18/2017, 12:18 PM  [place] [address] [place], [place], [address] Phone: [phone]   Fax:  [phone]  Name: ZEPPELIN COMMISSO MRN: [id number] Date of Birth: 03-07-1929

## 2017-11-18 NOTE — Patient Outreach (Signed)
[facility]) Care Management  11/18/2017  LUPE BONNER 1929-07-25 [id number]   No response from patient after 3 telephone calls and outreach letter attempt.  PLAN:  RNCM will refer patient to care management assistant to close patient due to being unable to reach. RNCM will send notification to patients primary MD of closure.   Quinn Plowman RN,BSN,CCM [facility] Telephonic  [phone]

## 2017-11-18 NOTE — Therapy (Signed)
[place], [place], [address] Phone: [phone]   Fax:  [phone]  Speech Language Pathology Treatment  Patient Details  Name: [name] MRN: [id number] Date of Birth: 1929-03-15 Referring Provider: Alysia Penna, MD   Encounter Date: 11/18/2017  End of Session - 11/18/17 1424    Visit Number  6    Number of Visits  9    Date for SLP Re-Evaluation  11/19/17    Authorization Type  [employer]    SLP Start Time  1117    SLP Stop Time   1206    SLP Time Calculation (min)  49 min    Activity Tolerance  Patient tolerated treatment well       Past Medical History:  Diagnosis Date  . Allergic rhinitis   . Asthmatic bronchitis   . Atrial fibrillation ([place])   . Celiac disease   . Cellulitis   . Diverticulosis   . DM (diabetes mellitus) ([place])   . Fx. left wrist 1987  . Gastric polyps   . GERD (gastroesophageal reflux disease)   . History of pneumonia    w/pleural effusion  . Hypothyroidism   . Iron deficiency anemia   . Melanoma ([place])   . Neuropathy   . Prostate cancer ([place])   . REM sleep behavior disorder   . RLS (restless legs syndrome)   . Sleep apnea     Past Surgical History:  Procedure Laterality Date  . APPENDECTOMY    . CARPAL TUNNEL RELEASE    . CATARACT EXTRACTION  03/2011   bilateral   . CHOLECYSTECTOMY  2001  . COLONOSCOPY W/ BIOPSIES  04/27/2008   diverticulosis, duodenitis  . FOOT SURGERY    . HERNIA REPAIR  1994   bilateral  . KNEE SURGERY     x 2  . LUNG SURGERY  2001  . PROSTATECTOMY  2002  . UPPER GASTROINTESTINAL ENDOSCOPY  04/27/2008   celiac disease, gastric polyps    There were no vitals filed for this visit.  Subjective Assessment - 11/18/17 1422    Subjective  "I am doing pretty well."    Currently in Pain?  No/denies       ADULT SLP TREATMENT - 11/18/17 0001      General Information   Behavior/Cognition  Alert;Cooperative;Pleasant mood    Patient Positioning  Upright in chair    Oral care provided  N/A    HPI  [name] is an 84 yo male who presented to [facility] with stroke symptoms/aphasia. He was given tPA after head CT and transferred to [facility]. MRI showed acute left MCA territory infarct (temporal lobe). He eventually transferred to inpatient rehab before discharging home on 10/16/2017. He is referred to outpatient for ongoing SLP therapy to address cognitive communication changes with aphasia present. He is accompanied to today's evaluation by his wife, [name]. 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 He was driving and handled all finances at home. His wife is currently helping with both at this time. [name] hopes to improve his speech and be able to communicate better with people since his stroke.       Treatment Provided   Treatment provided  Cognitive-Linquistic      Pain Assessment   Pain Assessment  No/denies pain      Cognitive-Linquistic Treatment   Treatment focused on  Aphasia;Patient/family/caregiver education  Skilled Treatment  word finding strategies, word deduction, problem solving, oral reading and reading comprehension,       Assessment / Recommendations / Plan   Plan  Continue with current plan of care       SLP Education - 11/18/17 1423    Education provided  Yes    Education Details  Provided with additional HEP (synonyms) and reviewed with Pt    Person(s) Educated  Patient    Methods  Explanation;Handout    Comprehension  Verbalized understanding       SLP Short Term Goals - 11/18/17 1426      SLP SHORT TERM GOAL #1   Title  Pt will implement word finding strategies with 90% accuracy when unable to verbalize desired word in conversation/functional tasks with mi/mod assist.    Baseline  75%    Time  4    Period  Weeks    Status  On-going      SLP SHORT TERM GOAL #2   Title  Pt will complete functional check  writing and balancing tasks with 100% acc with min assist and use of compensatory strategies as needed.    Baseline  Pt previously independent and not completing at this time since stroke    Time  4    Period  Weeks    Status  On-going      SLP SHORT TERM GOAL #3   Title  Pt will self-correct paraphasic errors 90% of the time during conversational speech with min cues.    Baseline  ~75% of the time    Time  4    Period  Weeks    Status  On-going      SLP SHORT TERM GOAL #4   Title  Pt will complete moderate-level thought organization and planning activities with 90% acc and min assist.    Baseline  mod assist    Time  4    Period  Weeks    Status  On-going      SLP SHORT TERM GOAL #5   Title  Pt will increase moderately complex divergent naming tasks to 7+ items with min assist and use of strategies as needed.     Baseline  ~5-8 concrete    Time  4    Period  Weeks    Status  On-going       SLP Long Term Goals - 11/18/17 1426      SLP LONG TERM GOAL #1   Title  Pt will express complex wants/needs to [facility] with use of compensatory strategies as needed.    Baseline  mi/mod impairment    Time  8    Period  Weeks    Status  On-going       Plan - 11/18/17 1424    Clinical Impression Statement  Pt alert and engaged during treatment session and making good progress toward goals. Pt utilized word finding strategies in conversation with min support and it was effective (SLP able to then cue and have him state the word) 90% of the time. He was unable to remember the name of one of his grandsons so this was turned into a divergent naming task where he was asked to select one male name for each letter of the alphabet. He completed with 84% acc independently and 100% with min cues. Synonym activity was introduced today and he required mod assist to complete and was given the rest for homework. Continue POC.    Speech Therapy  Frequency  2x / week    Duration  4 weeks     Treatment/Interventions  SLP instruction and feedback;Internal/external aids;Compensatory strategies;Compensatory techniques;Patient/family education;Functional tasks;Cueing hierarchy;Multimodal communcation approach    Potential to Achieve Goals  Good    SLP Home Exercise Plan  Pt will be independent with HEP as assigned to facilitate carryover of treatment strategies and techniques in home environment with assist from caregiver as needed.     Consulted and Agree with Plan of Care  Patient;Family member/caregiver    Family Member Consulted  Spouse, [name]       Patient will benefit from skilled therapeutic intervention in order to improve the following deficits and impairments:   Aphasia  Cognitive communication deficit    Problem List Patient Active Problem List   Diagnosis Date Noted  . Gait disturbance, post-stroke 11/02/2017  . Aphasia as late effect of cerebrovascular accident 11/02/2017  . Left middle cerebral artery stroke ([place]) 10/07/2017  . Aphasia   . PAF (paroxysmal atrial fibrillation) ([place])   . Diabetes mellitus type 2 in obese ([place])   . Benign essential HTN   . Hypothyroidism   . Hyperlipidemia   . Stroke (cerebrum) ([place]) 10/02/2017  . Afib ([place]) 01/10/2017  . SOB (shortness of breath) 01/10/2017  . Acute diastolic CHF (congestive heart failure) ([place]) 01/10/2017  . Atrial fibrillation with RVR ([place]) 01/10/2017  . Claudication ([place]) 07/25/2016  . Paroxysmal atrial fibrillation ([place]) 07/25/2016  . Odynophagia 04/10/2014  . Allergic rhinitis 03/23/2014  . Chronic rhinitis 12/22/2013  . 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 Constipation 08/22/2011  . Obesity 09/20/2009  . G E R D 11/29/2007  . Celiac disease 11/29/2007   Thank you,  Genene Churn, [place]  Genene Churn 11/18/2017, 2:27 PM  [facility] [address] [place], [place], [address] Phone: [phone]   Fax:  [phone]   Name: DEQUAN KINDRED MRN: [id number] Date of Birth: July 12, 1929

## 2017-11-23 ENCOUNTER — Other Ambulatory Visit: Payer: Self-pay

## 2017-11-23 ENCOUNTER — Ambulatory Visit (HOSPITAL_COMMUNITY): Payer: PPO

## 2017-11-23 ENCOUNTER — Encounter (HOSPITAL_COMMUNITY): Payer: Self-pay | Admitting: Speech Pathology

## 2017-11-23 ENCOUNTER — Encounter (HOSPITAL_COMMUNITY): Payer: Self-pay

## 2017-11-23 ENCOUNTER — Ambulatory Visit (HOSPITAL_COMMUNITY): Payer: PPO | Admitting: Speech Pathology

## 2017-11-23 DIAGNOSIS — Z9181 History of falling: Secondary | ICD-10-CM

## 2017-11-23 DIAGNOSIS — R2681 Unsteadiness on feet: Secondary | ICD-10-CM

## 2017-11-23 DIAGNOSIS — R4701 Aphasia: Secondary | ICD-10-CM

## 2017-11-23 DIAGNOSIS — R41841 Cognitive communication deficit: Secondary | ICD-10-CM | POA: Diagnosis not present

## 2017-11-23 DIAGNOSIS — I63512 Cerebral infarction due to unspecified occlusion or stenosis of left middle cerebral artery: Secondary | ICD-10-CM

## 2017-11-23 DIAGNOSIS — R2689 Other abnormalities of gait and mobility: Secondary | ICD-10-CM

## 2017-11-23 NOTE — Therapy (Signed)
[place], [place], [address] Phone: [phone]   Fax:  [phone]  Speech Language Pathology Treatment  Patient Details  Name: [name] MRN: [id number] Date of Birth: Feb 26, 1929 Referring Provider: Alysia Penna, MD   Encounter Date: 11/23/2017  End of Session - 11/23/17 1601    Visit Number  7    Number of Visits  9    Date for SLP Re-Evaluation  11/19/17    Authorization Type  [employer]    SLP Start Time  6962    SLP Stop Time   1601    SLP Time Calculation (min)  46 min    Activity Tolerance  Patient tolerated treatment well       Past Medical History:  Diagnosis Date  . Allergic rhinitis   . Asthmatic bronchitis   . Atrial fibrillation ([place])   . Celiac disease   . Cellulitis   . Diverticulosis   . DM (diabetes mellitus) ([place])   . Fx. left wrist 1987  . Gastric polyps   . GERD (gastroesophageal reflux disease)   . History of pneumonia    w/pleural effusion  . Hypothyroidism   . Iron deficiency anemia   . Melanoma ([place])   . Neuropathy   . Prostate cancer ([place])   . REM sleep behavior disorder   . RLS (restless legs syndrome)   . Sleep apnea     Past Surgical History:  Procedure Laterality Date  . APPENDECTOMY    . CARPAL TUNNEL RELEASE    . CATARACT EXTRACTION  03/2011   bilateral   . CHOLECYSTECTOMY  2001  . COLONOSCOPY W/ BIOPSIES  04/27/2008   diverticulosis, duodenitis  . FOOT SURGERY    . HERNIA REPAIR  1994   bilateral  . KNEE SURGERY     x 2  . LUNG SURGERY  2001  . PROSTATECTOMY  2002  . UPPER GASTROINTESTINAL ENDOSCOPY  04/27/2008   celiac disease, gastric polyps    There were no vitals filed for this visit.  Subjective Assessment - 11/23/17 1553    Subjective  I went to [facility] Tuesday."    Currently in Pain?  No/denies       ADULT SLP TREATMENT - 11/23/17 0001      General Information   Behavior/Cognition  Alert;Cooperative;Pleasant mood     Patient Positioning  Upright in chair    Oral care provided  N/A    HPI  [name] is an 84 yo male who presented to [facility] ED with stroke symptoms/aphasia. He was given tPA after head CT and transferred to [facility]. MRI showed acute left MCA territory infarct (temporal lobe). He eventually transferred to inpatient rehab before discharging home on 10/16/2017. He is referred to outpatient for ongoing SLP therapy to address cognitive communication changes with aphasia present. He is accompanied to today's evaluation by his wife, [name]. 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 He was driving and handled all finances at home. His wife is currently helping with both at this time. Mr. Craine hopes to improve his speech and be able to communicate better with people since his stroke.       Treatment Provided   Treatment provided  Cognitive-Linquistic      Pain Assessment   Pain Assessment  No/denies pain      Cognitive-Linquistic Treatment   Treatment focused on  Aphasia;Patient/family/caregiver education  Skilled Treatment  word finding strategies, word deduction, problem solving, oral reading and reading comprehension,       Assessment / Recommendations / Plan   Plan  Continue with current plan of care         SLP Short Term Goals - 11/23/17 1601      SLP SHORT TERM GOAL #1   Title  Pt will implement word finding strategies with 90% accuracy when unable to verbalize desired word in conversation/functional tasks with mi/mod assist.    Baseline  75%    Time  4    Period  Weeks    Status  On-going      SLP SHORT TERM GOAL #2   Title  Pt will complete functional check writing and balancing tasks with 100% acc with min assist and use of compensatory strategies as needed.    Baseline  Pt previously independent and not completing at this time since stroke    Time  4    Period  Weeks    Status  On-going      SLP SHORT  TERM GOAL #3   Title  Pt will self-correct paraphasic errors 90% of the time during conversational speech with min cues.    Baseline  ~75% of the time    Time  4    Period  Weeks    Status  On-going      SLP SHORT TERM GOAL #4   Title  Pt will complete moderate-level thought organization and planning activities with 90% acc and min assist.    Baseline  mod assist    Time  4    Period  Weeks    Status  On-going      SLP SHORT TERM GOAL #5   Title  Pt will increase moderately complex divergent naming tasks to 7+ items with min assist and use of strategies as needed.     Baseline  ~5-8 concrete    Time  4    Period  Weeks    Status  On-going       SLP Long Term Goals - 11/23/17 1602      SLP LONG TERM GOAL #1   Title  Pt will express complex wants/needs to [facility] with use of compensatory strategies as needed.    Baseline  mi/mod impairment    Time  8    Period  Weeks    Status  On-going       Plan - 11/23/17 1601    Clinical Impression Statement Pt alert and engaged in treatment session. All goals targeted this date with an emphasis on verbal expression. Homework was reviewed and corrected with assist from SLP. Mr. Istre had difficulty identifying errors even when read aloud by SLP. He was asked to name a synonym for a give word and then use the word in a sentence. He required mod verbal cues to identify a synonym or even explain a given word's meaning. He continues to utilize word finding strategies in conversation with indirect cues ("the place in [place] with music"). He demonstrated increased paraphasic errors when orally reading a short paragraph, however errors decreased when cued to read material silently first (rehearsal). Pt continues to make excellent progress toward goals. Session reviewed with spouse.    Speech Therapy Frequency  2x / week    Duration  4 weeks    Treatment/Interventions  SLP instruction and feedback;Internal/external aids;Compensatory  strategies;Compensatory techniques;Patient/family education;Functional tasks;Cueing hierarchy;Multimodal communcation approach    Potential to Achieve  Goals  Good    SLP Home Exercise Plan  Pt will be independent with HEP as assigned to facilitate carryover of treatment strategies and techniques in home environment with assist from caregiver as needed.     Consulted and Agree with Plan of Care  Patient;Family member/caregiver    Family Member Consulted  Spouse, [name]       Patient will benefit from skilled therapeutic intervention in order to improve the following deficits and impairments:   Cognitive communication deficit  Aphasia    Problem List Patient Active Problem List   Diagnosis Date Noted  . Gait disturbance, post-stroke 11/02/2017  . Aphasia as late effect of cerebrovascular accident 11/02/2017  . Left middle cerebral artery stroke ([place]) 10/07/2017  . Aphasia   . PAF (paroxysmal atrial fibrillation) ([place])   . Diabetes mellitus type 2 in obese ([place])   . Benign essential HTN   . Hypothyroidism   . Hyperlipidemia   . Stroke (cerebrum) ([place]) 10/02/2017  . Afib ([place]) 01/10/2017  . SOB (shortness of breath) 01/10/2017  . Acute diastolic CHF (congestive heart failure) ([place]) 01/10/2017  . Atrial fibrillation with RVR ([place]) 01/10/2017  . Claudication ([place]) 07/25/2016  . Paroxysmal atrial fibrillation ([place]) 07/25/2016  . Odynophagia 04/10/2014  . Allergic rhinitis 03/23/2014  . Chronic rhinitis 12/22/2013  . 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 Constipation 08/22/2011  . Obesity 09/20/2009  . G E R D 11/29/2007  . Celiac disease 11/29/2007   Thank you,  Genene Churn, [place]  [facility] 11/23/2017, 4:02 PM  [facility] [address] [place], [place], [address] Phone: [phone]   Fax:  [phone]   Name: [name] MRN: [id number] Date of Birth: 04-Dec-1928

## 2017-11-23 NOTE — Therapy (Signed)
[place], [place], [address] Phone: [phone]   Fax:  [phone]  Physical Therapy Treatment  Patient Details  Name: [name] MRN: [id number] Date of Birth: [birth year] Referring Provider: Alysia Penna, MD   Encounter Date: 11/23/2017  PT End of Session - 11/23/17 1524    Visit Number  9    Number of Visits  17    Date for PT Re-Evaluation  12/16/17    Authorization Type  [employer]    Authorization Time Period  10/21/17 - 12/16/17    PT Start Time  1435    PT Stop Time  1516    PT Time Calculation (min)  41 min    Equipment Utilized During Treatment  Gait belt    Activity Tolerance  Patient tolerated treatment well;No increased pain    Behavior During Therapy  WFL for tasks assessed/performed       Past Medical History:  Diagnosis Date  . Allergic rhinitis   . Asthmatic bronchitis   . Atrial fibrillation ([place])   . Celiac disease   . Cellulitis   . Diverticulosis   . DM (diabetes mellitus) ([place])   . Fx. left wrist 1987  . Gastric polyps   . GERD (gastroesophageal reflux disease)   . History of pneumonia    w/pleural effusion  . Hypothyroidism   . Iron deficiency anemia   . Melanoma ([place])   . Neuropathy   . Prostate cancer ([place])   . REM sleep behavior disorder   . RLS (restless legs syndrome)   . Sleep apnea     Past Surgical History:  Procedure Laterality Date  . APPENDECTOMY    . CARPAL TUNNEL RELEASE    . CATARACT EXTRACTION  03/2011   bilateral   . CHOLECYSTECTOMY  2001  . COLONOSCOPY W/ BIOPSIES  04/27/2008   diverticulosis, duodenitis  . FOOT SURGERY    . HERNIA REPAIR  1994   bilateral  . KNEE SURGERY     x 2  . LUNG SURGERY  2001  . PROSTATECTOMY  2002  . UPPER GASTROINTESTINAL ENDOSCOPY  04/27/2008   celiac disease, gastric polyps    There were no vitals filed for this visit.  Subjective Assessment - 11/23/17 1520    Subjective  Patient states he is  feeling good today, he is aving some pain in both of his knee, Left>Right. He states last Thursday he went out for the first time for something other than a doctor's appointments. He went to the pharmacy and out to [employer] in [place] for lunch. He states he was not too tired afterwards and did't need a recovery day.    Limitations  Standing;Walking;House hold activities    Patient Stated Goals  hopefully be able to walk and go to store and drive and all of those.     Currently in Pain?  Yes    Pain Score  5  3/10 on right; 5/10 on left    Pain Location  Knee    Pain Orientation  Right;Left    Pain Descriptors / Indicators  Aching;Sore    Pain Type  Chronic pain    Pain Onset  More than a month ago    Pain Frequency  Constant       [facility] Adult PT Treatment/Exercise - 11/23/17 0001      Knee/Hip Exercises: Aerobic   Tread Mill  Gait trainer range from 1.0 mph; 3x 1 minute with  standing rest breaks between; simple cues throughotu for longer steps on left and to bring chest up towards ceilign for good posture      Knee/Hip Exercises: Standing   Forward Step Up  Both;15 reps;Hand Hold: 1;Step Height: 6"      Knee/Hip Exercises: Seated   Sit to Sand  2 sets;5 reps;10 reps;without UE support cues for trunkl lean and slow descent       Balance Exercises - 11/23/17 1457      Balance Exercises: Standing   Tandem Stance  Eyes open;Foam/compliant surface;Intermittent upper extremity support;3 reps;15 secs    Tandem Gait  Forward;Retro;3 reps 15 feet, solid surface    Other Standing Exercises  --        PT Education - 11/23/17 1523    Education provided  Yes    Person(s) Educated  Patient    Methods  Explanation;Handout    Comprehension  Verbalized understanding       PT Short Term Goals - 11/16/17 1449      PT SHORT TERM GOAL #1   Title  Patient will be independent with HEP and be able to demonstrate exercises to improve balance and strength with greater independence.     Baseline  11/16/17 - at least 1x/day    Time  2    Period  Weeks    Status  Achieved      PT SHORT TERM GOAL #2   Title  Patient will improve 3MWT by 100 feet to improve activity tolerance and demonstrate safe gait velocity above 1.2 m/s to indicate reduced fall risk.    Baseline  11/16/17 - 352 feet today (0.6 m/s) patient ambulated 490 feet at eval    Time  4    Period  Weeks    Status  On-going      PT SHORT TERM GOAL #3   Title  Patient will be able to state 4 modifiable risk factors for stroke and report efforts to incorporate risk reducation at home to decrease recurrent stroke risk.    Baseline  11/16/17 - patient requires review and further education    Time  4    Period  Weeks    Status  On-going        PT Long Term Goals - 11/16/17 1627      PT LONG TERM GOAL #1   Title  Patient will improve distance with 3MWT by 150 feet and ambulate with LRAD at 1.2 m/s or faster to demonstrate safe gait velocity for community ambulation and reduced fall risk.    Baseline  11/16/17 - 352 feet today (0.6 m/s) patient ambulated 490 feet at eval    Time  8    Period  Weeks    Status  On-going      PT LONG TERM GOAL #2   Title  Patient will improve TUG to less than or equal to 13 seconds to demosntrate reduced fall risk.    Baseline  11/16/17 - 18.04 seconds with RW    Time  8    Period  Weeks    Status  On-going      PT LONG TERM GOAL #3   Title  Patient will demosntrate Five Time Sit to Stand in less than or equal to 13 seconds to demosntrate BLE strength improvements and reduce fall risk.    Baseline  11/16/17 - patient requires cues for safe sequencing    Time  8    Period  Weeks  Status  On-going        Plan - 11/23/17 1525    Clinical Impression Statement  Patient is progressing in therapy and performed the gait trainer today to improve reciprocal step pattern to encourage greater stance time on Rt LE. Patient continues to have right knee flexed during stance due to muscle  weakness/fatigue and will continue to benefit from quad strengthening. He remains limited by poor activity tolerance and impaired sequencing/timing of stepping with gait and step ups/downs. He demonstrated improved carryover from prior sessions with sit to stand transfers requiring fewer cues for forward trunk lean. Mr. Ende will continue to benefit from skilled PT services to address current impairments, decrease fall risk, and improve independence with functional mobility    Rehab Potential  Good    PT Frequency  2x / week    PT Duration  8 weeks    PT Treatment/Interventions  ADLs/Self Care Home Management;Electrical Stimulation;DME Instruction;Gait training;Stair training;Functional mobility training;Therapeutic activities;Therapeutic exercise;Balance training;Neuromuscular re-education;Patient/family education;Manual techniques;Passive range of motion;Energy conservation;Visual/perceptual remediation/compensation    PT Next Visit Plan  Continue with gait trainer and initiate balance master/biodex next session. Perform LAQ and SLR for isolated quad strengthening. Continue heel/toe raises (try with seated theraband next session) and abduction, STS and SLS activities (cone taps) and static balance activities.  Continue with dynamic gait training with dual task and balance activities for SLS, and tandem. Work on sequencing/timing of stepping with resisted step up to faciliate greater proprioception.    PT Home Exercise Plan  marching, tandem stance and sit to stand; ankle movements with theraband    Consulted and Agree with Plan of Care  Patient       Patient will benefit from skilled therapeutic intervention in order to improve the following deficits and impairments:  Abnormal gait, Decreased knowledge of use of DME, Impaired sensation, Improper body mechanics, Decreased mobility, Decreased activity tolerance, Decreased endurance, Decreased strength, Decreased balance, Difficulty walking, Impaired  vision/preception  Visit Diagnosis: Left middle cerebral artery stroke (HCC)  Other abnormalities of gait and mobility  Unsteadiness on feet  History of falling     Problem List Patient Active Problem List   Diagnosis Date Noted  . Gait disturbance, post-stroke 11/02/2017  . Aphasia as late effect of cerebrovascular accident 11/02/2017  . Left middle cerebral artery stroke ([place]) 10/07/2017  . Aphasia   . PAF (paroxysmal atrial fibrillation) ([place])   . Diabetes mellitus type 2 in obese ()   . Benign essential HTN   . Hypothyroidism   . Hyperlipidemia   . Stroke (cerebrum) ([place]) 10/02/2017  . Afib ([place]) 01/10/2017  . SOB (shortness of breath) 01/10/2017  . Acute diastolic CHF (congestive heart failure) ([place]) 01/10/2017  . Atrial fibrillation with RVR ([place]) 01/10/2017  . Claudication ([place]) 07/25/2016  . Paroxysmal atrial fibrillation ([place]) 07/25/2016  . Odynophagia 04/10/2014  . Allergic rhinitis 03/23/2014  . Chronic rhinitis 12/22/2013  . 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 Constipation 08/22/2011  . Obesity 09/20/2009  . G E R D 11/29/2007  . Celiac disease 11/29/2007    Kipp Brood, PT, DPT Physical Therapist with [facility]  11/23/2017 3:40 PM    [place], [place], [address] Phone: [phone]   Fax:  [phone]  Name: DONTREZ PETTIS MRN: [id number] Date of Birth: [birth year]

## 2017-11-25 ENCOUNTER — Telehealth (HOSPITAL_COMMUNITY): Payer: Self-pay

## 2017-11-25 ENCOUNTER — Ambulatory Visit (HOSPITAL_COMMUNITY): Payer: PPO | Admitting: Speech Pathology

## 2017-11-25 ENCOUNTER — Ambulatory Visit (HOSPITAL_COMMUNITY): Payer: PPO

## 2017-11-25 NOTE — Telephone Encounter (Signed)
Pt has pull a muscle and will not be here today

## 2017-11-30 ENCOUNTER — Encounter (HOSPITAL_COMMUNITY): Payer: Self-pay | Admitting: Speech Pathology

## 2017-11-30 ENCOUNTER — Other Ambulatory Visit: Payer: Self-pay

## 2017-11-30 ENCOUNTER — Encounter (HOSPITAL_COMMUNITY): Payer: Self-pay

## 2017-11-30 ENCOUNTER — Ambulatory Visit (HOSPITAL_COMMUNITY): Payer: PPO

## 2017-11-30 ENCOUNTER — Ambulatory Visit (HOSPITAL_COMMUNITY): Payer: PPO | Admitting: Speech Pathology

## 2017-11-30 DIAGNOSIS — R2689 Other abnormalities of gait and mobility: Secondary | ICD-10-CM

## 2017-11-30 DIAGNOSIS — Z9181 History of falling: Secondary | ICD-10-CM

## 2017-11-30 DIAGNOSIS — R41841 Cognitive communication deficit: Secondary | ICD-10-CM

## 2017-11-30 DIAGNOSIS — R2681 Unsteadiness on feet: Secondary | ICD-10-CM

## 2017-11-30 DIAGNOSIS — R4701 Aphasia: Secondary | ICD-10-CM

## 2017-11-30 DIAGNOSIS — I63512 Cerebral infarction due to unspecified occlusion or stenosis of left middle cerebral artery: Secondary | ICD-10-CM

## 2017-11-30 NOTE — Therapy (Signed)
[place], [place], [address] Phone: [phone]   Fax:  [phone]  Physical Therapy Treatment  Patient Details  Name: [name] MRN: [id number] Date of Birth: 1929-08-15 Referring Provider: Alysia Penna, MD   Encounter Date: 11/30/2017  PT End of Session - 11/30/17 1747    Visit Number  10    Number of Visits  17    Date for PT Re-Evaluation  12/16/17    Authorization Type  [employer]    Authorization Time Period  10/21/17 - 12/16/17    PT Start Time  1434    PT Stop Time  1518    PT Time Calculation (min)  44 min    Equipment Utilized During Treatment  Gait belt    Activity Tolerance  Patient tolerated treatment well;No increased pain    Behavior During Therapy  WFL for tasks assessed/performed       Past Medical History:  Diagnosis Date  . Allergic rhinitis   . Asthmatic bronchitis   . Atrial fibrillation ([place])   . Celiac disease   . Cellulitis   . Diverticulosis   . DM (diabetes mellitus) ([place])   . Fx. left wrist 1987  . Gastric polyps   . GERD (gastroesophageal reflux disease)   . History of pneumonia    w/pleural effusion  . Hypothyroidism   . Iron deficiency anemia   . Melanoma ([place])   . Neuropathy   . Prostate cancer ([place])   . REM sleep behavior disorder   . RLS (restless legs syndrome)   . Sleep apnea     Past Surgical History:  Procedure Laterality Date  . APPENDECTOMY    . CARPAL TUNNEL RELEASE    . CATARACT EXTRACTION  03/2011   bilateral   . CHOLECYSTECTOMY  2001  . COLONOSCOPY W/ BIOPSIES  04/27/2008   diverticulosis, duodenitis  . FOOT SURGERY    . HERNIA REPAIR  1994   bilateral  . KNEE SURGERY     x 2  . LUNG SURGERY  2001  . PROSTATECTOMY  2002  . UPPER GASTROINTESTINAL ENDOSCOPY  04/27/2008   celiac disease, gastric polyps    There were no vitals filed for this visit.  Subjective Assessment - 11/30/17 1441    Subjective  He reports being in a  lot of pain after his last session and states he thinks it was all the walking on the treadmill that aggravated his hips and that he believes he has "bad hips". He states he was still able to keep up with some of his HEP but not all of them.     Limitations  Standing;Walking;House hold activities    Patient Stated Goals  hopefully be able to walk and go to store and drive and all of those.     Currently in Pain?  Yes    Pain Score  5     Pain Location  Hip    Pain Orientation  Right;Left    Pain Descriptors / Indicators  Aching;Sore    Pain Type  Acute pain    Pain Onset  In the past 7 days    Pain Frequency  Constant    Aggravating Factors   walking, standing    Pain Relieving Factors  rest    Effect of Pain on Daily Activities  has to push through it        [facility] Adult PT Treatment/Exercise - 11/30/17 0001  Ambulation/Gait   Ambulation/Gait  Yes    Ambulation Distance (Feet)  226 Feet    Assistive device  Rolling walker    Gait Pattern  Step-through pattern;Decreased stride length;Decreased hip/knee flexion - left;Decreased hip/knee flexion - right;Decreased dorsiflexion - left;Decreased dorsiflexion - right;Left foot flat;Right foot flat;Trunk flexed;Wide base of support;Trendelenburg    Gait Comments  Patient with left LE externally rotated using adductors to propell Lt LE forward      Knee/Hip Exercises: Standing   Forward Step Up  Both;Step Height: 4";15 reps cues for sequencing step pattern      Knee/Hip Exercises: Seated   Sit to Sand  3 sets;5 reps no UE support, cues for sequencing trunk lean       Balance Exercises - 11/30/17 1506      Balance Exercises: Standing   Tandem Stance  Eyes open;Foam/compliant surface;Intermittent upper extremity support;15 secs;4 reps alternating foot position    Balance Master: Limits for Stability  2:24 to complete easy level on second trial (did not complete on first trial), stability level 7    Balance Master: Dynamic  2x 1:30;  1st attempt patient unable to remain within line D; 2nd attempt patient remained within line C primarily wtih a small portion between lines D and C. He used Rt UE support throughout        PT Education - 11/30/17 1747    Education provided  Yes    Education Details  Educated one xercsie form throughout, educated on proper weight shifting strategies for balance activities    Person(s) Educated  Patient    Methods  Explanation    Comprehension  Verbalized understanding       PT Short Term Goals - 11/16/17 1449      PT SHORT TERM GOAL #1   Title  Patient will be independent with HEP and be able to demonstrate exercises to improve balance and strength with greater independence.    Baseline  11/16/17 - at least 1x/day    Time  2    Period  Weeks    Status  Achieved      PT SHORT TERM GOAL #2   Title  Patient will improve 3MWT by 100 feet to improve activity tolerance and demonstrate safe gait velocity above 1.2 m/s to indicate reduced fall risk.    Baseline  11/16/17 - 352 feet today (0.6 m/s) patient ambulated 490 feet at eval    Time  4    Period  Weeks    Status  On-going      PT SHORT TERM GOAL #3   Title  Patient will be able to state 4 modifiable risk factors for stroke and report efforts to incorporate risk reducation at home to decrease recurrent stroke risk.    Baseline  11/16/17 - patient requires review and further education    Time  4    Period  Weeks    Status  On-going        PT Long Term Goals - 11/16/17 1627      PT LONG TERM GOAL #1   Title  Patient will improve distance with 3MWT by 150 feet and ambulate with LRAD at 1.2 m/s or faster to demonstrate safe gait velocity for community ambulation and reduced fall risk.    Baseline  11/16/17 - 352 feet today (0.6 m/s) patient ambulated 490 feet at eval    Time  8    Period  Weeks    Status  On-going  PT LONG TERM GOAL #2   Title  Patient will improve TUG to less than or equal to 13 seconds to demosntrate reduced  fall risk.    Baseline  11/16/17 - 18.04 seconds with RW    Time  8    Period  Weeks    Status  On-going      PT LONG TERM GOAL #3   Title  Patient will demosntrate Five Time Sit to Stand in less than or equal to 13 seconds to demosntrate BLE strength improvements and reduce fall risk.    Baseline  11/16/17 - patient requires cues for safe sequencing    Time  8    Period  Weeks    Status  On-going        Plan - 11/30/17 1748    Clinical Impression Statement  Patient is progressing well in therapy and initiated biodex/balance master training today. He required verbal/tactile cues to weight shift and maintain upright posture on unstable surface as he has the tendency to lean posteriorly. Gait trainer was withheld today as patient believes it aggravated and caused pain in his hips. He demonstrated improved step sequencing for forward step-ups and has improved awareness to correct his mistakes.  He will benefit from isolated quad strengthening next session. He continues to require frequent and extended rest breaks throughout due to fatigue. Mr. Achord will continue to benefit from skilled PT services to address current impairments, decrease fall risk, and improve independence with functional mobility.    Rehab Potential  Good    PT Frequency  2x / week    PT Duration  8 weeks    PT Treatment/Interventions  ADLs/Self Care Home Management;Electrical Stimulation;DME Instruction;Gait training;Stair training;Functional mobility training;Therapeutic activities;Therapeutic exercise;Balance training;Neuromuscular re-education;Patient/family education;Manual techniques;Passive range of motion;Energy conservation;Visual/perceptual remediation/compensation    PT Next Visit Plan  Continue with balance master/biodex next session and perform LAQ and SLR for isolated quad strengthening. Continue heel/toe raises in sitting during rest breaks. Single limb balance activities and dynamic gait will be beneficial.      PT  Home Exercise Plan  marching, tandem stance and sit to stand; ankle movements with theraband    Consulted and Agree with Plan of Care  Patient       Patient will benefit from skilled therapeutic intervention in order to improve the following deficits and impairments:  Abnormal gait, Decreased knowledge of use of DME, Impaired sensation, Improper body mechanics, Decreased mobility, Decreased activity tolerance, Decreased endurance, Decreased strength, Decreased balance, Difficulty walking, Impaired vision/preception  Visit Diagnosis: Left middle cerebral artery stroke (HCC)  Other abnormalities of gait and mobility  Unsteadiness on feet  History of falling     Problem List Patient Active Problem List   Diagnosis Date Noted  . Gait disturbance, post-stroke 11/02/2017  . Aphasia as late effect of cerebrovascular accident 11/02/2017  . Left middle cerebral artery stroke ([place]) 10/07/2017  . Aphasia   . PAF (paroxysmal atrial fibrillation) ([place])   . Diabetes mellitus type 2 in obese ([place])   . Benign essential HTN   . Hypothyroidism   . Hyperlipidemia   . Stroke (cerebrum) ([place]) 10/02/2017  . Afib ([place]) 01/10/2017  . SOB (shortness of breath) 01/10/2017  . Acute diastolic CHF (congestive heart failure) ([place]) 01/10/2017  . Atrial fibrillation with RVR ([place]) 01/10/2017  . Claudication ([place]) 07/25/2016  . Paroxysmal atrial fibrillation ([place]) 07/25/2016  . Odynophagia 04/10/2014  . Allergic rhinitis 03/23/2014  . Chronic rhinitis 12/22/2013  . 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  Constipation 08/22/2011  . Obesity 09/20/2009  . G E R D 11/29/2007  . Celiac disease 11/29/2007    Kipp Brood, PT, DPT Physical Therapist with [facility]  11/30/2017 6:06 PM    [place], [place], [address] Phone: [phone]   Fax:  [phone]  Name: ARY LAVINE MRN: [id number] Date of Birth:  1929/03/20

## 2017-11-30 NOTE — Therapy (Signed)
[place], [place], [address] Phone: [phone]   Fax:  [phone]  Speech Language Pathology Treatment  Patient Details  Name: [name] [name] Date of Birth: [birth year] Referring Provider: Alysia Penna, MD   Encounter Date: 11/30/2017  End of Session - 11/30/17 1638    Visit Number  8    Number of Visits  9    Date for SLP Re-Evaluation  11/19/17    Authorization Type  [employer]    SLP Start Time  1522    SLP Stop Time   1610    SLP Time Calculation (min)  48 min    Activity Tolerance  Patient tolerated treatment well       Past Medical History:  Diagnosis Date  . Allergic rhinitis   . Asthmatic bronchitis   . Atrial fibrillation ([place])   . Celiac disease   . Cellulitis   . Diverticulosis   . DM (diabetes mellitus) ([place])   . Fx. left wrist 1987  . Gastric polyps   . GERD (gastroesophageal reflux disease)   . History of pneumonia    w/pleural effusion  . Hypothyroidism   . Iron deficiency anemia   . Melanoma ([place])   . Neuropathy   . Prostate cancer ([place])   . REM sleep behavior disorder   . RLS (restless legs syndrome)   . Sleep apnea     Past Surgical History:  Procedure Laterality Date  . APPENDECTOMY    . CARPAL TUNNEL RELEASE    . CATARACT EXTRACTION  03/2011   bilateral   . CHOLECYSTECTOMY  2001  . COLONOSCOPY W/ BIOPSIES  04/27/2008   diverticulosis, duodenitis  . FOOT SURGERY    . HERNIA REPAIR  1994   bilateral  . KNEE SURGERY     x 2  . LUNG SURGERY  2001  . PROSTATECTOMY  2002  . UPPER GASTROINTESTINAL ENDOSCOPY  04/27/2008   celiac disease, gastric polyps    There were no vitals filed for this visit.  Subjective Assessment - 11/30/17 1636    Subjective  "I was really sore after my PT session last week."    Currently in Pain?  No/denies       ADULT SLP TREATMENT - 11/30/17 0001      General Information   Behavior/Cognition   Alert;Cooperative;Pleasant mood    Patient Positioning  Upright in chair    Oral care provided  N/A    HPI  [name] is an 84 yo male who presented to [facility] ED with stroke symptoms/aphasia. He was given tPA after head CT and transferred to [facility]. MRI showed acute left MCA territory infarct (temporal lobe). He eventually transferred to inpatient rehab before discharging home on 10/16/2017. He is referred to outpatient for ongoing SLP therapy to address cognitive communication changes with aphasia present. He is accompanied to today's evaluation by his wife, [name]. 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 He was driving and handled all finances at home. His wife is currently helping with both at this time. [name] hopes to improve his speech and be able to communicate better with people since his stroke.       Treatment Provided   Treatment provided  Cognitive-Linquistic      Pain Assessment   Pain Assessment  No/denies pain      Cognitive-Linquistic Treatment   Treatment  focused on  Aphasia;Patient/family/caregiver education    Skilled Treatment  word finding strategies, word deduction, problem solving, oral reading and reading comprehension,       Assessment / Recommendations / Plan   Plan  Continue with current plan of care       SLP Education - 11/30/17 1637    Education provided  Yes    Education Details  Review of session, HEP, and plan for additional SLP therapy    Person(s) Educated  Patient;Spouse;Child(ren)    Methods  Explanation;Handout    Comprehension  Verbalized understanding       SLP Short Term Goals - 11/30/17 1638      SLP SHORT TERM GOAL #1   Title  Pt will implement word finding strategies with 90% accuracy when unable to verbalize desired word in conversation/functional tasks with mi/mod assist.    Baseline  75%    Time  4    Period  Weeks    Status  On-going      SLP SHORT TERM  GOAL #2   Title  Pt will complete functional check writing and balancing tasks with 100% acc with min assist and use of compensatory strategies as needed.    Baseline  Pt previously independent and not completing at this time since stroke    Time  4    Period  Weeks    Status  On-going      SLP SHORT TERM GOAL #3   Title  Pt will self-correct paraphasic errors 90% of the time during conversational speech with min cues.    Baseline  ~75% of the time    Time  4    Period  Weeks    Status  On-going      SLP SHORT TERM GOAL #4   Title  Pt will complete moderate-level thought organization and planning activities with 90% acc and min assist.    Baseline  mod assist    Time  4    Period  Weeks    Status  On-going      SLP SHORT TERM GOAL #5   Title  Pt will increase moderately complex divergent naming tasks to 7+ items with min assist and use of strategies as needed.     Baseline  ~5-8 concrete    Time  4    Period  Weeks    Status  On-going       SLP Long Term Goals - 11/30/17 1639      SLP LONG TERM GOAL #1   Title  Pt will express complex wants/needs to [facility] with use of compensatory strategies as needed.    Baseline  mi/mod impairment    Time  8    Period  Weeks    Status  On-going       Plan - 11/30/17 1638    Clinical Impression Statement Pt alert and engaged throughout SLP session. He reported some frustration with homework and SLP encouraged him to take breaks and enlist the help from his wife as needed. He continues to read novels at home, but is aware that he is likely missing some details. He was encouraged to read one "light/easy read" novel and also shorter/daily devotional readings which can completed in one sitting. He completed divergent and convergent naming tasks in session with 100% acc with indirect cues. Pt asked to write to dictation and also to verbalize a sentence with a given word and then write. He benefited from cues to utilize verbal  mediation strategy and  needed continued reminders to implement. Plan to re-assess goals after next session and plan for 2-4 more weeks of treatment to address deficits. Pt is making excellent progress toward goals. Pt/spouse in agreement with plan of care.    Speech Therapy Frequency  2x / week    Duration  4 weeks    Treatment/Interventions  SLP instruction and feedback;Internal/external aids;Compensatory strategies;Compensatory techniques;Patient/family education;Functional tasks;Cueing hierarchy;Multimodal communcation approach    Potential to Achieve Goals  Good    SLP Home Exercise Plan  Pt will be independent with HEP as assigned to facilitate carryover of treatment strategies and techniques in home environment with assist from caregiver as needed.     Consulted and Agree with Plan of Care  Patient;Family member/caregiver    Family Member Consulted  Spouse, [name]       Patient will benefit from skilled therapeutic intervention in order to improve the following deficits and impairments:   Cognitive communication deficit  Aphasia    Problem List Patient Active Problem List   Diagnosis Date Noted  . Gait disturbance, post-stroke 11/02/2017  . Aphasia as late effect of cerebrovascular accident 11/02/2017  . Left middle cerebral artery stroke ([place]) 10/07/2017  . Aphasia   . PAF (paroxysmal atrial fibrillation) ([place])   . Diabetes mellitus type 2 in obese ([place])   . Benign essential HTN   . Hypothyroidism   . Hyperlipidemia   . Stroke (cerebrum) ([place]) 10/02/2017  . Afib ([place]) 01/10/2017  . SOB (shortness of breath) 01/10/2017  . Acute diastolic CHF (congestive heart failure) ([place]) 01/10/2017  . Atrial fibrillation with RVR ([place]) 01/10/2017  . Claudication ([place]) 07/25/2016  . Paroxysmal atrial fibrillation ([place]) 07/25/2016  . Odynophagia 04/10/2014  . Allergic rhinitis 03/23/2014  . Chronic rhinitis 12/22/2013  . 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 Constipation 08/22/2011  . Obesity 09/20/2009  . G E  R D 11/29/2007  . Celiac disease 11/29/2007   Thank you,  Genene Churn, [place]  [facility] 11/30/2017, 4:40 PM  [place] [address] [place], [place], [address] Phone: [phone]   Fax:  [phone]   Name: [name] [name] Date of Birth: Jun 29, 1929

## 2017-12-01 ENCOUNTER — Ambulatory Visit: Payer: PPO | Admitting: Diagnostic Neuroimaging

## 2017-12-01 ENCOUNTER — Encounter: Payer: Self-pay | Admitting: Diagnostic Neuroimaging

## 2017-12-01 VITALS — BP 137/60 | HR 69 | Ht 70.0 in | Wt 230.8 lb

## 2017-12-01 DIAGNOSIS — I63512 Cerebral infarction due to unspecified occlusion or stenosis of left middle cerebral artery: Secondary | ICD-10-CM | POA: Diagnosis not present

## 2017-12-01 NOTE — Progress Notes (Signed)
[facility]  PATIENT: [name] DOB: 1929-01-09  REFERRING CLINICIAN: stroke follow up HISTORY FROM: patient, wife, daughter, grand-daughter REASON FOR VISIT: new consult    HISTORICAL  CHIEF COMPLAINT:  Chief Complaint  Patient presents with  . Stroke    rm 6, New Pt , hospital FU, grand dgtr- Elmyra Ricks, wife- Larena Glassman, dgtr- Cheryl    HISTORY OF PRESENT ILLNESS:   84 year old male here for evaluation of stroke.  Patient was admitted to the hospital in December 2018 for left MCA ischemic infarction, secondary to atrial fibrillation, patient was not on anticoagulation at that time due to history of GI bleeding and fall risk.  Patient had resultant mixed aphasia.  Patient doing better now.  Patient tolerating medications.  Patient having some muscle aches and pains, and is concerned that Lipitor may be causing this.  Patient was previously on red yeast rice prior to stroke.  Patient's blood pressure has been fairly stable but slightly elevated.  One of his blood pressure medication was held during his acute stroke, and family is asking about restarting this.  Patient also asking about safety for driving.  Patient's family is concerned about his ability to drive safely.   REVIEW OF SYSTEMS: Full 14 system review of systems performed and negative with exception of: Fatigue memory loss confusion sleepiness feeling cold aching muscles decreased energy.  ALLERGIES: Allergies  Allergen Reactions  . Clindamycin Diarrhea  . Gluten Meal Diarrhea    No rye; no barley = HAS CELIAC DISEASE  . Hydrocodone Other (See Comments)    Caused mental status changes and suffered withdrawals when he stopped it  . Penicillins Other (See Comments)    From childhood: Has patient had a PCN reaction causing immediate rash, facial/tongue/throat swelling, SOB or lightheadedness with hypotension: Unk Has patient had a PCN reaction causing severe rash involving mucus membranes or  skin necrosis: Unk Has patient had a PCN reaction that required hospitalization: Unk Has patient had a PCN reaction occurring within the last 10 years: No If all of the above answers are "NO", then may proceed with Cephalosporin use.   . Procaine Other (See Comments)    "Passes out"  . Sulfonamide Derivatives Hives  . Tape Other (See Comments)    PLEASE USE AN ALTERNATIVE; LIKE COBAN WRAP; TAPE TEARS THE SKIN AND CAUSES BLISTERS!!  . Wheat Bran Diarrhea    Pt family states pt has celiac disease  . Cephalexin Diarrhea and Nausea And Vomiting    Dry hives, "violent" diarhea  . Mold Extract [Trichophyton] Other (See Comments)    Stuffiness, post-nasal drip    HOME MEDICATIONS: Outpatient Medications Prior to Visit  Medication Sig Dispense Refill  . amitriptyline (ELAVIL) 75 MG tablet Take 75 mg by mouth at bedtime.    Marland Kitchen apixaban (ELIQUIS) 5 MG TABS tablet Take 1 tablet (5 mg total) by mouth 2 (two) times daily. 60 tablet 1  . Ascorbic Acid (VITAMIN C) 1000 MG tablet Take 1,000 mg by mouth 2 (two) times daily.      Marland Kitchen atorvastatin (LIPITOR) 40 MG tablet Take 1 tablet (40 mg total) by mouth daily at 6 PM. 30 tablet 0  . azelastine (ASTELIN) 0.1 % nasal spray Place 1 spray into both nostrils at bedtime.     . Cholecalciferol (VITAMIN D3) 1000 UNITS CAPS Take 3,000 Units by mouth 3 (three) times daily.     . diclofenac sodium (VOLTAREN) 1 % GEL Apply 2 g topically 4 (four) times daily.  1 Tube 0  . fluticasone (FLONASE) 50 MCG/ACT nasal spray Place 1 spray into both nostrils daily.     . furosemide (LASIX) 20 MG tablet Take 1 tablet (20 mg total) by mouth daily. 60 tablet 3  . glipiZIDE (GLUCOTROL XL) 2.5 MG 24 hr tablet Take 2.5 mg by mouth daily with breakfast.    . JANUVIA 100 MG tablet 100 mg daily. morning    . levocetirizine (XYZAL) 5 MG tablet Take 5 mg by mouth at bedtime.     Marland Kitchen levothyroxine (SYNTHROID, LEVOTHROID) 150 MCG tablet Take 1 tablet (150 mcg total) by mouth daily before  breakfast. 30 tablet 0  . magnesium gluconate (MAGONATE) 500 MG tablet Take 500 mg by mouth daily.      . Melatonin 3 MG TABS Take 3 mg by mouth at bedtime.     . Metoprolol Tartrate 75 MG TABS Take 75 mg by mouth 2 (two) times daily. 60 tablet 0  . Multiple Vitamin (MULTIVITAMIN) tablet Take 1 tablet by mouth daily.      . Omega-3 Fatty Acids (FISH OIL) 1000 MG CAPS Take 1 capsule by mouth daily.    . pantoprazole (PROTONIX) 40 MG tablet Take 1 tablet (40 mg total) by mouth daily. 30 tablet 0   No facility-administered medications prior to visit.     PAST MEDICAL HISTORY: Past Medical History:  Diagnosis Date  . Allergic rhinitis   . Asthmatic bronchitis   . Atrial fibrillation ([place])   . Celiac disease   . Cellulitis   . Diverticulosis   . DM (diabetes mellitus) ([place])   . Fx. left wrist 1987  . Gastric polyps   . GERD (gastroesophageal reflux disease)   . History of pneumonia    w/pleural effusion  . Hypothyroidism   . Iron deficiency anemia   . Melanoma ([place])    both feet  . Neuropathy   . Prostate cancer ([place])   . REM sleep behavior disorder   . RLS (restless legs syndrome)   . Sleep apnea   . Stroke [facility]) 09/2017    PAST SURGICAL HISTORY: Past Surgical History:  Procedure Laterality Date  . APPENDECTOMY  1978  . CARPAL TUNNEL RELEASE Left   . CATARACT EXTRACTION  03/2011   bilateral   . CHOLECYSTECTOMY  2001  . COLONOSCOPY W/ BIOPSIES  04/27/2008   diverticulosis, duodenitis  . FOOT SURGERY    . HERNIA REPAIR  1994   bilateral  . KNEE SURGERY Bilateral    x 2  . LUNG SURGERY  2001   for infection  . PROSTATECTOMY  2002  . UPPER GASTROINTESTINAL ENDOSCOPY  04/27/2008   celiac disease, gastric polyps    FAMILY HISTORY: Family History  Problem Relation Age of Onset  . Breast cancer Mother   . Kidney failure Father   . Heart disease Father   . Rheumatic fever Father   . Colon cancer Unknown     SOCIAL HISTORY:  Social History   Socioeconomic  History  . Marital status: Married    Spouse name: [name]  . Number of children: 3  . Years of education: college degree  . Highest education level: Not on file  Social Needs  . Financial resource strain: Not on file  . Food insecurity - worry: Not on file  . Food insecurity - inability: Not on file  . Transportation needs - medical: Not on file  . Transportation needs - non-medical: Not on file  Occupational History  .  Occupation: Retired    Comment: Theme park manager  Tobacco Use  . Smoking status: Never Smoker  . Smokeless tobacco: Never Used  Substance and Sexual Activity  . Alcohol use: No  . Drug use: No  . Sexual activity: No    Birth control/protection: Abstinence  Other Topics Concern  . 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  Visual Acuity Screening   Right eye Left eye Both eyes  Without correction:     With correction: 20/40 20/40      Patient is in no distress; well developed, nourished and groomed; neck is supple  CARDIOVASCULAR:  Examination of carotid arteries is normal; no carotid bruits  Regular rate and rhythm, no murmurs  Examination of peripheral vascular system by observation and palpation is normal  EYES:  Ophthalmoscopic exam of optic discs and posterior segments is normal; no papilledema or hemorrhages  MUSCULOSKELETAL:  Gait, strength, tone, movements noted in Neurologic exam below  NEUROLOGIC: MENTAL STATUS:  No flowsheet data found.  awake, alert, oriented to person, place and time  recent and remote memory intact  normal attention and concentration  language fluent, comprehension intact, naming intact,   fund of knowledge  appropriate  CRANIAL NERVE:   2nd - no papilledema on fundoscopic exam  2nd, 3rd, 4th, 6th - pupils equal and reactive to light, visual fields full to confrontation, extraocular muscles intact, no nystagmus  5th - facial sensation symmetric  7th - facial strength symmetric  8th - hearing intact  9th - palate elevates symmetrically, uvula midline  11th - shoulder shrug symmetric  12th - tongue protrusion midline  MOTOR:   normal bulk and tone, full strength in the BUE, BLE EXCEPT BILATERAL FOOT DORSIFLEXION 3  SENSORY:   normal and symmetric to light touch; ABSENT VIB AT ANKLES AND TOES  COORDINATION:   finger-nose-finger, fine finger movements normal  REFLEXES:   deep tendon reflexes TRACE and symmetric; ABSENT IN BLE  GAIT/STATION:   WIDE BASED GAIT; USING WALKER; UNSTEADY    DIAGNOSTIC DATA (LABS, IMAGING, TESTING) - I reviewed patient records, labs, notes, testing and imaging myself where available.  Lab Results  Component Value Date   WBC 9.9 10/08/2017   HGB 12.6 (L) 10/08/2017   HCT 38.2 (L) 10/08/2017   MCV 96.7 10/08/2017   PLT 309 10/08/2017      Component Value Date/Time   NA 137 10/08/2017 0848   NA 141 05/15/2017 1506   K 3.5 10/08/2017 0848   CL 102 10/08/2017 0848   CO2 24 10/08/2017 0848   GLUCOSE 202 (H) 10/08/2017 0848   BUN 20 10/08/2017 0848   BUN 37 (H) 05/15/2017 1506   CREATININE 1.21 10/08/2017 0848   CREATININE 1.54 (H) 03/12/2017 1148   CALCIUM 8.8 (L) 10/08/2017 0848   PROT 7.0 10/08/2017 0848   ALBUMIN 3.3 (L) 10/08/2017 0848   AST 37 10/08/2017 0848   ALT 33 10/08/2017 0848   ALKPHOS 87 10/08/2017 0848   BILITOT 0.8 10/08/2017 0848   GFRNONAA 52 (L) 10/08/2017 0848   GFRNONAA 41 (L) 03/12/2017 1148   GFRAA >60 10/08/2017 0848  GFRAA 47 (L) 03/12/2017 1148   Lab Results  Component Value Date   CHOL 160 10/03/2017   HDL 37 (L) 10/03/2017   LDLCALC 90 10/03/2017   TRIG 164 (H) 10/03/2017   CHOLHDL 4.3  10/03/2017   Lab Results  Component Value Date   HGBA1C 7.8 (H) 10/03/2017   Lab Results  Component Value Date   HLKTGYBW38 937 (H) 03/24/2008   Lab Results  Component Value Date   TSH 10.604 (H) 10/07/2017    10/03/17 MRI brain [I reviewed images myself and agree with interpretation. -VRP]  - Acute LEFT MCA territory infarct, primarily temporal lobe involvement, likely a single LEFT M3 branch, cannot be visualized even in retrospect on yesterday's imaging studies. - Atrophy and small vessel disease. - Consistent with history of atrial fibrillation, this is likely an embolic stroke, with evidence for reperfusion on gradient sequence today. Some coalescence posteriorly. Baseline noncontrast CT of the head recommended for follow-up. A call has been placed to the ordering provider.     ASSESSMENT AND PLAN  84 y.o. year old male here with atrial fibrillation and left MCA stroke, now on anticoagulation.  Patient doing well.  Dx:  1. Left middle cerebral artery stroke (HCC)      PLAN: - continue eliquis - continue statin, BP control, diabetes control - no driving (due to muscle weakness in feet, neuropathy)  Return if symptoms worsen or fail to improve, for return to PCP.    Penni Bombard, MD 3/42/8768, 1:15 PM Certified in Neurology, Neurophysiology and Neuroimaging  [facility] [address], [place], [place] [address] [phone]

## 2017-12-01 NOTE — Patient Instructions (Signed)
-   continue current medications  - may consider restarting micardis (discuss with Dr. Nevada Crane)  - may consider switching lipitor to red yeast rice (discuss with Dr. Nevada Crane)  - no driving

## 2017-12-02 ENCOUNTER — Ambulatory Visit (HOSPITAL_COMMUNITY): Payer: PPO | Attending: Internal Medicine

## 2017-12-02 ENCOUNTER — Encounter (HOSPITAL_COMMUNITY): Payer: Self-pay | Admitting: Speech Pathology

## 2017-12-02 ENCOUNTER — Encounter (HOSPITAL_COMMUNITY): Payer: Self-pay

## 2017-12-02 ENCOUNTER — Other Ambulatory Visit: Payer: Self-pay

## 2017-12-02 ENCOUNTER — Ambulatory Visit (HOSPITAL_COMMUNITY): Payer: PPO | Admitting: Speech Pathology

## 2017-12-02 DIAGNOSIS — R2689 Other abnormalities of gait and mobility: Secondary | ICD-10-CM | POA: Insufficient documentation

## 2017-12-02 DIAGNOSIS — R41841 Cognitive communication deficit: Secondary | ICD-10-CM | POA: Diagnosis not present

## 2017-12-02 DIAGNOSIS — R2681 Unsteadiness on feet: Secondary | ICD-10-CM | POA: Diagnosis not present

## 2017-12-02 DIAGNOSIS — Z9181 History of falling: Secondary | ICD-10-CM | POA: Diagnosis not present

## 2017-12-02 DIAGNOSIS — I63512 Cerebral infarction due to unspecified occlusion or stenosis of left middle cerebral artery: Secondary | ICD-10-CM | POA: Insufficient documentation

## 2017-12-02 DIAGNOSIS — R4701 Aphasia: Secondary | ICD-10-CM

## 2017-12-02 NOTE — Therapy (Signed)
[place], [place], [address] Phone: [phone]   Fax:  [phone]  Speech Language Pathology Treatment  Patient Details  Name: [name] MRN: [id number] Date of Birth: [birth year] Referring Provider: Alysia Penna, MD   Encounter Date: 12/02/2017  End of Session - 12/02/17 1308    Visit Number  9    Number of Visits  9    Date for SLP Re-Evaluation  11/19/17    Authorization Type  [employer]    SLP Start Time  1115    SLP Stop Time   1200    SLP Time Calculation (min)  45 min    Activity Tolerance  Patient tolerated treatment well       Past Medical History:  Diagnosis Date  . Allergic rhinitis   . Asthmatic bronchitis   . Atrial fibrillation ([place])   . Celiac disease   . Cellulitis   . Diverticulosis   . DM (diabetes mellitus) ([place])   . Fx. left wrist 1987  . Gastric polyps   . GERD (gastroesophageal reflux disease)   . History of pneumonia    w/pleural effusion  . Hypothyroidism   . Iron deficiency anemia   . Melanoma ([place])    both feet  . Neuropathy   . Prostate cancer ([place])   . REM sleep behavior disorder   . RLS (restless legs syndrome)   . Sleep apnea   . Stroke [facility]) 09/2017    Past Surgical History:  Procedure Laterality Date  . APPENDECTOMY  1978  . CARPAL TUNNEL RELEASE Left   . CATARACT EXTRACTION  03/2011   bilateral   . CHOLECYSTECTOMY  2001  . COLONOSCOPY W/ BIOPSIES  04/27/2008   diverticulosis, duodenitis  . FOOT SURGERY    . HERNIA REPAIR  1994   bilateral  . KNEE SURGERY Bilateral    x 2  . LUNG SURGERY  2001   for infection  . PROSTATECTOMY  2002  . UPPER GASTROINTESTINAL ENDOSCOPY  04/27/2008   celiac disease, gastric polyps    There were no vitals filed for this visit.  Subjective Assessment - 12/02/17 1308    Subjective  "The neurologist doesn't want me to drive."    Currently in Pain?  No/denies            ADULT SLP TREATMENT -  12/02/17 0001      General Information   Behavior/Cognition  Alert;Cooperative;Pleasant mood    Patient Positioning  Upright in chair    Oral care provided  N/A    HPI  [name] is an 84 yo male who presented to [facility] ED with stroke symptoms/aphasia. He was given tPA after head CT and transferred to [facility]. MRI showed acute left MCA territory infarct (temporal lobe). He eventually transferred to inpatient rehab before discharging home on 10/16/2017. He is referred to outpatient for ongoing SLP therapy to address cognitive communication changes with aphasia present. He is accompanied to today's evaluation by his wife, [name]. 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 He was driving and handled all finances at home. His wife is currently helping with both at this time. [name] hopes to improve his speech and be able to communicate better with people since his stroke.       Treatment Provided   Treatment provided  Cognitive-Linquistic      Pain Assessment  Pain Assessment  No/denies pain      Cognitive-Linquistic Treatment   Treatment focused on  Aphasia;Patient/family/caregiver education    Skilled Treatment  word finding strategies, word deduction, problem solving, oral reading and reading comprehension,       Assessment / Recommendations / Plan   Plan  Continue with current plan of care         SLP Short Term Goals - 12/02/17 1309      SLP SHORT TERM GOAL #1   Title  Pt will implement word finding strategies with 90% accuracy when unable to verbalize desired word in conversation/functional tasks with mi/mod assist. change to indirect cues    Baseline  75%    Time  4    Period  Weeks    Status  Achieved      SLP SHORT TERM GOAL #2   Title  Pt will complete functional check writing and balancing tasks with 100% acc with min assist and use of compensatory strategies as needed.    Baseline  Pt previously  independent and not completing at this time since stroke    Time  4    Period  Weeks    Status  Achieved      SLP SHORT TERM GOAL #3   Title  Pt will self-correct paraphasic errors 90% of the time during conversational speech with min cues.    Baseline  ~75% of the time    Time  4    Period  Weeks    Status  Achieved      SLP SHORT TERM GOAL #4   Title  Pt will complete moderate-level thought organization and planning activities with 90% acc and min assist.    Baseline  mod assist    Time  4    Period  Weeks    Status  Partially Met      SLP SHORT TERM GOAL #5   Title  Pt will increase moderately complex divergent naming tasks to 7+ items with min assist and use of strategies as needed.     Baseline  ~5-8 concrete    Time  4    Period  Weeks    Status  Achieved      Additional Short Term Goals   Additional Short Term Goals  Yes      SLP SHORT TERM GOAL #6   Title  Pt will verbally describe items by salient features (3+) with 100% acc with min assist from SLP.     Baseline  90% with mi/mod cues    Time  4    Period  Weeks    Status  New    Target Date  12/30/17      SLP SHORT TERM GOAL #7   Title  Pt will orally read paragraph length material with 90% acc with cues for error awareness in order for Pt to self correct.     Baseline  80% acc mi/mod assist    Time  4    Period  Weeks    Status  New    Target Date  12/30/17       SLP Long Term Goals - 12/02/17 1314      SLP LONG TERM GOAL #1   Title  Pt will express complex wants/needs to [facility] with use of compensatory strategies as needed.    Baseline  mi/mod impairment    Time  8    Period  Weeks    Status  On-going  Plan - 12/02/17 1309    Clinical Impression Statement  Pt alert and engaged throughout SLP session. He was able to report about his neurology appointment yesterday and stated that he was not cleared for driving, but unable to state why. SLP reviewed note with Pt and informed that the MD stated  that he advised "no driving due to neuropathy". Pt completed confrontation naming tasks with 100% acc with mi/mod assist. Pt's errors were related and close to accurate responses ("pig back for piggy bank and footprints for foot tracks"), with reduced awareness of error. Pt benefited from written prompts to increase error awareness. He was able to describe items with 85% acc with mi/mod prompts. Pt met 4/5 short term goals and partially met the fifth goal. Goals have been updated for a recommendation of another 4 weeks of therapy. Pt is making excellent progress toward goals. Pt/spouse in agreement with plan of care.     Speech Therapy Frequency  2x / week    Duration  4 weeks    Treatment/Interventions  SLP instruction and feedback;Internal/external aids;Compensatory strategies;Compensatory techniques;Patient/family education;Functional tasks;Cueing hierarchy;Multimodal communcation approach    Potential to Achieve Goals  Good    SLP Home Exercise Plan  Pt will be independent with HEP as assigned to facilitate carryover of treatment strategies and techniques in home environment with assist from caregiver as needed.     Consulted and Agree with Plan of Care  Patient;Family member/caregiver    Family Member Consulted  Spouse, [name]       Patient will benefit from skilled therapeutic intervention in order to improve the following deficits and impairments:   Cognitive communication deficit  Aphasia    Problem List Patient Active Problem List   Diagnosis Date Noted  . Gait disturbance, post-stroke 11/02/2017  . Aphasia as late effect of cerebrovascular accident 11/02/2017  . 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 Aphasia   . PAF (paroxysmal atrial fibrillation) ([place])   . Diabetes mellitus type 2 in obese ([place])   . Benign essential HTN   . Hypothyroidism   . Hyperlipidemia   . Stroke (cerebrum) ([place]) 10/02/2017  . Afib ([place]) 01/10/2017  . SOB (shortness of breath)  01/10/2017  . Acute diastolic CHF (congestive heart failure) ([place]) 01/10/2017  . Atrial fibrillation with RVR ([place]) 01/10/2017  . Claudication ([place]) 07/25/2016  . Paroxysmal atrial fibrillation ([place]) 07/25/2016  . Odynophagia 04/10/2014  . Allergic rhinitis 03/23/2014  . Chronic rhinitis 12/22/2013  . 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 Constipation 08/22/2011  . Obesity 09/20/2009  . G E R D 11/29/2007  . Celiac disease 11/29/2007   Thank you,  Genene Churn, [place]  [facility] 12/02/2017, 1:17 PM  [place] [address] [place], [place], [address] Phone: [phone]   Fax:  [phone]   Name: [name] MRN: [id number] Date of Birth: September 19, 1929

## 2017-12-02 NOTE — Therapy (Signed)
[place], [place], [address] Phone: [phone]   Fax:  [phone]  Physical Therapy Treatment  Patient Details  Name: [name] MRN: [id number] Date of Birth: 11-11-1928 Referring Provider: Alysia Penna, MD   Encounter Date: 12/02/2017  PT End of Session - 12/02/17 1057    Visit Number  11    Number of Visits  17    Date for PT Re-Evaluation  12/16/17    Authorization Type  [employer]    Authorization Time Period  10/21/17 - 12/16/17    PT Start Time  1036    PT Stop Time  1115    PT Time Calculation (min)  39 min    Equipment Utilized During Treatment  Gait belt    Activity Tolerance  Patient limited by pain;No increased pain;Patient tolerated treatment well;Patient limited by fatigue    Behavior During Therapy  [facility] for tasks assessed/performed       Past Medical History:  Diagnosis Date  . Allergic rhinitis   . Asthmatic bronchitis   . Atrial fibrillation ([place])   . Celiac disease   . Cellulitis   . Diverticulosis   . DM (diabetes mellitus) ([place])   . Fx. left wrist 1987  . Gastric polyps   . GERD (gastroesophageal reflux disease)   . History of pneumonia    w/pleural effusion  . Hypothyroidism   . Iron deficiency anemia   . Melanoma ([place])    both feet  . Neuropathy   . Prostate cancer ([place])   . REM sleep behavior disorder   . RLS (restless legs syndrome)   . Sleep apnea   . Stroke [facility]) 09/2017    Past Surgical History:  Procedure Laterality Date  . APPENDECTOMY  1978  . CARPAL TUNNEL RELEASE Left   . CATARACT EXTRACTION  03/2011   bilateral   . CHOLECYSTECTOMY  2001  . COLONOSCOPY W/ BIOPSIES  04/27/2008   diverticulosis, duodenitis  . FOOT SURGERY    . HERNIA REPAIR  1994   bilateral  . KNEE SURGERY Bilateral    x 2  . LUNG SURGERY  2001   for infection  . PROSTATECTOMY  2002  . UPPER GASTROINTESTINAL ENDOSCOPY  04/27/2008   celiac disease, gastric polyps     There were no vitals filed for this visit.  Subjective Assessment - 12/02/17 1039    Subjective  Pt reports he had some pain in Bil LE, pain scale 5/10.  Reports a fall last week while walkingat home with RW.    Patient Stated Goals  hopefully be able to walk and go to store and drive and all of those.     Currently in Pain?  Yes    Pain Score  5     Pain Location  Hip    Pain Orientation  Right;Left    Pain Descriptors / Indicators  Aching;Sore    Pain Type  Acute pain    Pain Onset  In the past 7 days    Pain Frequency  Constant    Aggravating Factors   walking, standing     Pain Relieving Factors  rest    Effect of Pain on Daily Activities  has to push through it                      [facility] Adult PT Treatment/Exercise - 12/02/17 0001      Knee/Hip Exercises: Standing   Forward  Step Up  Both;15 reps;Hand Hold: 1;Step Height: 4" cueing for sequence      Knee/Hip Exercises: Seated   Long Arc Quad  Both;15 reps 2# Lt LE 15x; Rt 0# 4 reps limited by pain    Sit to Sand  2 sets 2 sets x 7 reps       Knee/Hip Exercises: Supine   Short Arc Quad Sets  15 reps;Both 2#    Straight Leg Raises  Both;15 reps Lt 2#; Rt limited by pain with activity = 7 reps          Balance Exercises - 12/02/17 1116      Balance Exercises: Standing   Balance Master: Limits for Stability  2:31 L7 1 HHA    Balance Master: Dynamic  2x 1:30          PT Short Term Goals - 11/16/17 1449      PT SHORT TERM GOAL #1   Title  Patient will be independent with HEP and be able to demonstrate exercises to improve balance and strength with greater independence.    Baseline  11/16/17 - at least 1x/day    Time  2    Period  Weeks    Status  Achieved      PT SHORT TERM GOAL #2   Title  Patient will improve 3MWT by 100 feet to improve activity tolerance and demonstrate safe gait velocity above 1.2 m/s to indicate reduced fall risk.    Baseline  11/16/17 - 352 feet today (0.6 m/s) patient  ambulated 490 feet at eval    Time  4    Period  Weeks    Status  On-going      PT SHORT TERM GOAL #3   Title  Patient will be able to state 4 modifiable risk factors for stroke and report efforts to incorporate risk reducation at home to decrease recurrent stroke risk.    Baseline  11/16/17 - patient requires review and further education    Time  4    Period  Weeks    Status  On-going        PT Long Term Goals - 11/16/17 1627      PT LONG TERM GOAL #1   Title  Patient will improve distance with 3MWT by 150 feet and ambulate with LRAD at 1.2 m/s or faster to demonstrate safe gait velocity for community ambulation and reduced fall risk.    Baseline  11/16/17 - 352 feet today (0.6 m/s) patient ambulated 490 feet at eval    Time  8    Period  Weeks    Status  On-going      PT LONG TERM GOAL #2   Title  Patient will improve TUG to less than or equal to 13 seconds to demosntrate reduced fall risk.    Baseline  11/16/17 - 18.04 seconds with RW    Time  8    Period  Weeks    Status  On-going      PT LONG TERM GOAL #3   Title  Patient will demosntrate Five Time Sit to Stand in less than or equal to 13 seconds to demosntrate BLE strength improvements and reduce fall risk.    Baseline  11/16/17 - patient requires cues for safe sequencing    Time  8    Period  Weeks    Status  On-going            Plan - 12/02/17 1245  Clinical Impression Statement  Pt c/o increased BLE pain, feels may be related to weather today, monitored pain through session.  Session focus with functional strengthening primarly quad and gluteal strengthening.  Increased difficulty/pain wiht Rt LE this session.  Pt continues to require cueing to improve weight shifting and maintaining upright posture and requires assistance for safety as tendency to lean posteriorly.  No reports of increased pain through session, was limited by fatigue requiring seated rest breaks.      Rehab Potential  Good    PT Frequency  2x /  week    PT Duration  8 weeks    PT Treatment/Interventions  ADLs/Self Care Home Management;Electrical Stimulation;DME Instruction;Gait training;Stair training;Functional mobility training;Therapeutic activities;Therapeutic exercise;Balance training;Neuromuscular re-education;Patient/family education;Manual techniques;Passive range of motion;Energy conservation;Visual/perceptual remediation/compensation    PT Next Visit Plan  Continue with balance master/biodex next session and perform LAQ and SLR for isolated quad strengthening. Continue heel/toe raises in sitting during rest breaks. Single limb balance activities and dynamic gait will be beneficial.      PT Home Exercise Plan  marching, tandem stance and sit to stand; ankle movements with theraband       Patient will benefit from skilled therapeutic intervention in order to improve the following deficits and impairments:  Abnormal gait, Decreased knowledge of use of DME, Impaired sensation, Improper body mechanics, Decreased mobility, Decreased activity tolerance, Decreased endurance, Decreased strength, Decreased balance, Difficulty walking, Impaired vision/preception  Visit Diagnosis: Left middle cerebral artery stroke (HCC)  Other abnormalities of gait and mobility  Unsteadiness on feet  History of falling     Problem List Patient Active Problem List   Diagnosis Date Noted  . Gait disturbance, post-stroke 11/02/2017  . Aphasia as late effect of cerebrovascular accident 11/02/2017  . 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 Aphasia   . PAF (paroxysmal atrial fibrillation) ([place])   . Diabetes mellitus type 2 in obese ([place])   . Benign essential HTN   . Hypothyroidism   . Hyperlipidemia   . Stroke (cerebrum) ([place]) 10/02/2017  . Afib ([place]) 01/10/2017  . SOB (shortness of breath) 01/10/2017  . Acute diastolic CHF (congestive heart failure) ([place]) 01/10/2017  . Atrial fibrillation with RVR ([place]) 01/10/2017  .  Claudication ([place]) 07/25/2016  . Paroxysmal atrial fibrillation ([place]) 07/25/2016  . Odynophagia 04/10/2014  . Allergic rhinitis 03/23/2014  . Chronic rhinitis 12/22/2013  . 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 Constipation 08/22/2011  . Obesity 09/20/2009  . G E R D 11/29/2007  . Celiac disease 11/29/2007   Ihor Austin, [place]; [place]  Aldona Lento 12/02/2017, 12:50 PM  [place], [place], [address] Phone: [phone]   Fax:  [phone]  Name: [name] MRN: [id number] Date of Birth: 03/04/1929

## 2017-12-04 DIAGNOSIS — E782 Mixed hyperlipidemia: Secondary | ICD-10-CM | POA: Diagnosis not present

## 2017-12-04 DIAGNOSIS — E1142 Type 2 diabetes mellitus with diabetic polyneuropathy: Secondary | ICD-10-CM | POA: Diagnosis not present

## 2017-12-04 DIAGNOSIS — I10 Essential (primary) hypertension: Secondary | ICD-10-CM | POA: Diagnosis not present

## 2017-12-04 DIAGNOSIS — E559 Vitamin D deficiency, unspecified: Secondary | ICD-10-CM | POA: Diagnosis not present

## 2017-12-07 ENCOUNTER — Ambulatory Visit: Payer: PPO | Admitting: Podiatry

## 2017-12-08 ENCOUNTER — Encounter (HOSPITAL_COMMUNITY): Payer: Self-pay | Admitting: Speech Pathology

## 2017-12-08 ENCOUNTER — Ambulatory Visit (HOSPITAL_COMMUNITY): Payer: PPO

## 2017-12-08 ENCOUNTER — Ambulatory Visit (HOSPITAL_COMMUNITY): Payer: PPO | Admitting: Speech Pathology

## 2017-12-08 ENCOUNTER — Other Ambulatory Visit: Payer: Self-pay

## 2017-12-08 ENCOUNTER — Encounter (HOSPITAL_COMMUNITY): Payer: Self-pay

## 2017-12-08 DIAGNOSIS — R41841 Cognitive communication deficit: Secondary | ICD-10-CM | POA: Diagnosis not present

## 2017-12-08 DIAGNOSIS — Z9181 History of falling: Secondary | ICD-10-CM

## 2017-12-08 DIAGNOSIS — I63512 Cerebral infarction due to unspecified occlusion or stenosis of left middle cerebral artery: Secondary | ICD-10-CM

## 2017-12-08 DIAGNOSIS — R2681 Unsteadiness on feet: Secondary | ICD-10-CM

## 2017-12-08 DIAGNOSIS — R4701 Aphasia: Secondary | ICD-10-CM

## 2017-12-08 DIAGNOSIS — R2689 Other abnormalities of gait and mobility: Secondary | ICD-10-CM

## 2017-12-08 NOTE — Therapy (Signed)
[place], [place], [address] Phone: [phone]   Fax:  [phone]  Physical Therapy Treatment  Patient Details  Name: [name] MRN: [id number] Date of Birth: 1928-12-27 Referring Provider: Alysia Penna, MD   Encounter Date: 12/08/2017  PT End of Session - 12/08/17 1519    Visit Number  12    Number of Visits  17    Date for PT Re-Evaluation  12/16/17    Authorization Type  [employer]    Authorization Time Period  10/21/17 - 12/16/17    PT Start Time  1435    PT Stop Time  1518    PT Time Calculation (min)  43 min    Equipment Utilized During Treatment  Gait belt    Activity Tolerance  Patient limited by pain;No increased pain;Patient tolerated treatment well;Patient limited by fatigue    Behavior During Therapy  [facility] for tasks assessed/performed       Past Medical History:  Diagnosis Date  . Allergic rhinitis   . Asthmatic bronchitis   . Atrial fibrillation ([place])   . Celiac disease   . Cellulitis   . Diverticulosis   . DM (diabetes mellitus) ([place])   . Fx. left wrist 1987  . Gastric polyps   . GERD (gastroesophageal reflux disease)   . History of pneumonia    w/pleural effusion  . Hypothyroidism   . Iron deficiency anemia   . Melanoma ([place])    both feet  . Neuropathy   . Prostate cancer ([place])   . REM sleep behavior disorder   . RLS (restless legs syndrome)   . Sleep apnea   . Stroke [facility]) 09/2017    Past Surgical History:  Procedure Laterality Date  . APPENDECTOMY  1978  . CARPAL TUNNEL RELEASE Left   . CATARACT EXTRACTION  03/2011   bilateral   . CHOLECYSTECTOMY  2001  . COLONOSCOPY W/ BIOPSIES  04/27/2008   diverticulosis, duodenitis  . FOOT SURGERY    . HERNIA REPAIR  1994   bilateral  . KNEE SURGERY Bilateral    x 2  . LUNG SURGERY  2001   for infection  . PROSTATECTOMY  2002  . UPPER GASTROINTESTINAL ENDOSCOPY  04/27/2008   celiac disease, gastric polyps     There were no vitals filed for this visit.  Subjective Assessment - 12/08/17 1446    Subjective  Patient reports feeling better today than he did last session, he denies pain in his hips. He states he has fallen 3 times in February and that he always falls backwards when it happens. He reports he is typically standing still when he falls and states "I don't know what happens I just lose my balance".    Limitations  Standing;Walking;House hold activities    How long can you sit comfortably?  unlimited    How long can you stand comfortably?  needs RW, 25 minutes easy    How long can you walk comfortably?  household distance    Patient Stated Goals  hopefully be able to walk and go to store and drive and all of those.     Currently in Pain?  No/denies         [facility] Adult PT Treatment/Exercise - 12/08/17 0001      Knee/Hip Exercises: Seated   Long Arc Quad  Strengthening;Right;10 reps no weight, limited by pain in right thigh and hip    Sit to Sand  2 sets;without  UE support 2x 8 reps; cues for trunk lean and posture        Balance Exercises - 12/08/17 1455      Balance Exercises: Standing   Standing Eyes Opened  Narrow base of support (BOS);Solid surface;5 reps;30 secs;Limitations multiplanar perturbations for postural reaction training    Balance Master: Limits for Stability  Level 7 1 HHA. 2 trials: 1st = 2:14 to complete, 2nd = 1:37 to complete. right fingertip assist for balance    Balance Master: Dynamic  Level 7 1 HHA. 2x 3 minutes, patietn with tendency to lean left wtih majority of tracing in left anterior and posterior quadrant. verbal cues needed to weigth shift into rigth leg. right fingertips for support    Tandem Gait  Forward;Limitations;4 reps no UE support in // bars    Retro Gait  4 reps;Limitations in // bars, no UE support        PT Education - 12/08/17 1632    Education provided  Yes    Education Details  Educated on weight shift for postural righting  reactions during therapy. Educated on delayed muscle pain and muscle soreness from exercise. Educated on proper weight shifting for sit to stand transfers.    Person(s) Educated  Patient    Methods  Explanation    Comprehension  Verbalized understanding       PT Short Term Goals - 11/16/17 1449      PT SHORT TERM GOAL #1   Title  Patient will be independent with HEP and be able to demonstrate exercises to improve balance and strength with greater independence.    Baseline  11/16/17 - at least 1x/day    Time  2    Period  Weeks    Status  Achieved      PT SHORT TERM GOAL #2   Title  Patient will improve 3MWT by 100 feet to improve activity tolerance and demonstrate safe gait velocity above 1.2 m/s to indicate reduced fall risk.    Baseline  11/16/17 - 352 feet today (0.6 m/s) patient ambulated 490 feet at eval    Time  4    Period  Weeks    Status  On-going      PT SHORT TERM GOAL #3   Title  Patient will be able to state 4 modifiable risk factors for stroke and report efforts to incorporate risk reducation at home to decrease recurrent stroke risk.    Baseline  11/16/17 - patient requires review and further education    Time  4    Period  Weeks    Status  On-going        PT Long Term Goals - 11/16/17 1627      PT LONG TERM GOAL #1   Title  Patient will improve distance with 3MWT by 150 feet and ambulate with LRAD at 1.2 m/s or faster to demonstrate safe gait velocity for community ambulation and reduced fall risk.    Baseline  11/16/17 - 352 feet today (0.6 m/s) patient ambulated 490 feet at eval    Time  8    Period  Weeks    Status  On-going      PT LONG TERM GOAL #2   Title  Patient will improve TUG to less than or equal to 13 seconds to demosntrate reduced fall risk.    Baseline  11/16/17 - 18.04 seconds with RW    Time  8    Period  Weeks    Status  On-going      PT LONG TERM GOAL #3   Title  Patient will demosntrate Five Time Sit to Stand in less than or equal to 13  seconds to demosntrate BLE strength improvements and reduce fall risk.    Baseline  11/16/17 - patient requires cues for safe sequencing    Time  8    Period  Weeks    Status  On-going        Plan - 12/08/17 1633    Clinical Impression Statement  Patient is progressing in therapy but continues to require verbal/tactile cues to weight shift and maintain upright posture and has the tendency to lean posteriorly. He improved his ability to weight shift during biodex/balance master training however continues to have a tendency to lean left and posteriorly. He continues to require frequent and extended rest breaks throughout due to fatigue. He reported pain with LAQ for Rt LE and will benefit from SAQ strengthening to achieve full right quad activation to facilitate knee extension during gait. Mr. Zweber will continue to benefit from skilled PT services to address current impairments, decrease fall risk, and improve independence with functional mobility.    Rehab Potential  Good    PT Frequency  2x / week    PT Duration  8 weeks    PT Treatment/Interventions  ADLs/Self Care Home Management;Electrical Stimulation;DME Instruction;Gait training;Stair training;Functional mobility training;Therapeutic activities;Therapeutic exercise;Balance training;Neuromuscular re-education;Patient/family education;Manual techniques;Passive range of motion;Energy conservation;Visual/perceptual remediation/compensation    PT Next Visit Plan  Continue with balance master/biodex next session and perform SAQ (hold on LAQ due to pain in right LE) and SLR for isolated quad strengthening. Continue heel/toe raises in sitting during rest breaks. Single limb balance activities and dynamic gait will be beneficial and retro gait as well as tandem gait. Continue traingin stepping strategies to prevent falls and postural reactions for safe weights shifting to prevent LOB/falls.    PT Home Exercise Plan  marching, tandem stance and sit to  stand; ankle movements with theraband    Consulted and Agree with Plan of Care  Patient       Patient will benefit from skilled therapeutic intervention in order to improve the following deficits and impairments:  Abnormal gait, Decreased knowledge of use of DME, Impaired sensation, Improper body mechanics, Decreased mobility, Decreased activity tolerance, Decreased endurance, Decreased strength, Decreased balance, Difficulty walking, Impaired vision/preception  Visit Diagnosis: Left middle cerebral artery stroke (HCC)  Other abnormalities of gait and mobility  Unsteadiness on feet  History of falling     Problem List Patient Active Problem List   Diagnosis Date Noted  . Gait disturbance, post-stroke 11/02/2017  . Aphasia as late effect of cerebrovascular accident 11/02/2017  . 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 Aphasia   . PAF (paroxysmal atrial fibrillation) ([place])   . Diabetes mellitus type 2 in obese ([place])   . Benign essential HTN   . Hypothyroidism   . Hyperlipidemia   . Stroke (cerebrum) ([place]) 10/02/2017  . Afib ([place]) 01/10/2017  . SOB (shortness of breath) 01/10/2017  . Acute diastolic CHF (congestive heart failure) ([place]) 01/10/2017  . Atrial fibrillation with RVR ([place]) 01/10/2017  . Claudication ([place]) 07/25/2016  . Paroxysmal atrial fibrillation ([place]) 07/25/2016  . Odynophagia 04/10/2014  . Allergic rhinitis 03/23/2014  . Chronic rhinitis 12/22/2013  . 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 Constipation 08/22/2011  . Obesity 09/20/2009  . G E R D 11/29/2007  . Celiac disease 11/29/2007    Kipp Brood,  PT, DPT Physical Therapist with [facility]  12/08/2017 4:37 PM    [facility] [address] [place], [place], [address] Phone: [phone]   Fax:  [phone]  Name: [name] MRN: [id number] Date of Birth: 1929-06-18

## 2017-12-08 NOTE — Therapy (Signed)
[place], [place], [address] Phone: [phone]   Fax:  [phone]  Speech Language Pathology Treatment  Patient Details  Name: [name] MRN: [id number] Date of Birth: 1929-08-27 Referring Provider: Alysia Penna, MD   Encounter Date: 12/08/2017  End of Session - 12/08/17 1641    Visit Number  10    Number of Visits  17   Date for SLP Re-Evaluation  01/17/18    Authorization Type  [employer]    SLP Start Time  9798    SLP Stop Time   1605    SLP Time Calculation (min)  50 min    Activity Tolerance  Patient tolerated treatment well       Past Medical History:  Diagnosis Date  . Allergic rhinitis   . Asthmatic bronchitis   . Atrial fibrillation ([place])   . Celiac disease   . Cellulitis   . Diverticulosis   . DM (diabetes mellitus) ([place])   . Fx. left wrist 1987  . Gastric polyps   . GERD (gastroesophageal reflux disease)   . History of pneumonia    w/pleural effusion  . Hypothyroidism   . Iron deficiency anemia   . Melanoma ([place])    both feet  . Neuropathy   . Prostate cancer ([place])   . REM sleep behavior disorder   . RLS (restless legs syndrome)   . Sleep apnea   . Stroke [facility]) 09/2017    Past Surgical History:  Procedure Laterality Date  . APPENDECTOMY  1978  . CARPAL TUNNEL RELEASE Left   . CATARACT EXTRACTION  03/2011   bilateral   . CHOLECYSTECTOMY  2001  . COLONOSCOPY W/ BIOPSIES  04/27/2008   diverticulosis, duodenitis  . FOOT SURGERY    . HERNIA REPAIR  1994   bilateral  . KNEE SURGERY Bilateral    x 2  . LUNG SURGERY  2001   for infection  . PROSTATECTOMY  2002  . UPPER GASTROINTESTINAL ENDOSCOPY  04/27/2008   celiac disease, gastric polyps    There were no vitals filed for this visit.  Subjective Assessment - 12/08/17 1640    Subjective  "I am reading another book."    Currently in Pain?  No/denies       ADULT SLP TREATMENT - 12/08/17 0001      General Information   Behavior/Cognition  Alert;Cooperative;Pleasant mood    Patient Positioning  Upright in chair    Oral care provided  N/A    HPI  [name] is an 84 yo male who presented to [facility] ED with stroke symptoms/aphasia. He was given tPA after head CT and transferred to [facility]. MRI showed acute left MCA territory infarct (temporal lobe). He eventually transferred to inpatient rehab before discharging home on 10/16/2017. He is referred to outpatient for ongoing SLP therapy to address cognitive communication changes with aphasia present. He is accompanied to today's evaluation by his wife, [name]. 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 He was driving and handled all finances at home. His wife is currently helping with both at this time. Mr. Sortino hopes to improve his speech and be able to communicate better with people since his stroke.       Treatment Provided   Treatment provided  Cognitive-Linquistic      Pain Assessment   Pain Assessment  No/denies pain  Cognitive-Linquistic Treatment   Treatment focused on  Aphasia;Patient/family/caregiver education    Skilled Treatment  word finding strategies, word deduction, problem solving, oral reading and reading comprehension,       Assessment / Recommendations / Plan   Plan  Continue with current plan of care         SLP Short Term Goals - 12/08/17 1641      SLP SHORT TERM GOAL #1   Title  Pt will implement word finding strategies with 90% accuracy when unable to verbalize desired word in conversation/functional tasks with indirect cues. change to indirect cues    Baseline  75%    Time  4    Period  Weeks    Status  On-going      SLP SHORT TERM GOAL #2   Title  Pt will complete functional check writing and balancing tasks with 100% acc with min assist and use of compensatory strategies as needed.    Baseline  Pt previously independent and not  completing at this time since stroke    Time  4    Period  Weeks    Status  Achieved      SLP SHORT TERM GOAL #3   Title  Pt will self-correct paraphasic errors 90% of the time during conversational speech with min cues.    Baseline  ~75% of the time    Time  4    Period  Weeks    Status  Achieved      SLP SHORT TERM GOAL #4   Title  Pt will complete moderate-level thought organization and planning activities with 90% acc and min assist.    Baseline  mod assist    Time  4    Period  Weeks    Status  On-going      SLP SHORT TERM GOAL #5   Title  Pt will increase moderately complex divergent naming tasks to 7+ items with min assist and use of strategies as needed.     Baseline  ~5-8 concrete    Time  4    Period  Weeks    Status  On-going      SLP SHORT TERM GOAL #6   Title  Pt will verbally describe items by salient features (3+) with 100% acc with min assist from SLP.     Baseline  90% with mi/mod cues    Time  4    Period  Weeks    Status  On-going      SLP SHORT TERM GOAL #7   Title  Pt will orally read paragraph length material with 90% acc with cues for error awareness in order for Pt to self correct.     Baseline  80% acc mi/mod assist    Time  4    Period  Weeks    Status  On-going       SLP Long Term Goals - 12/08/17 1642      SLP LONG TERM GOAL #1   Title  Pt will express complex wants/needs to [facility] with use of compensatory strategies as needed.    Baseline  mi/mod impairment    Time  8    Period  Weeks    Status  On-going       Plan - 12/08/17 1641    Clinical Impression Statement Pt alert and engaged throughout SLP session. Pt met 4/5 short term goals and partially met the fifth goal. Goals have been updated for a recommendation of  another 4 weeks of therapy. Pt is making excellent progress toward goals. Goals updated last session and targeted today. Mr. Lucier orally read 15+ sentences and only required one cue for error awareness. He self corrected his  minimal errors 90% of the time. He spoke at length today explaining certain concepts and relaying information about what he is reading at home. He successfully implemented word finding strategies without assist from SLP ("when you sell things and people say what they will pay" /auction). Pt/spouse in agreement with plan of care.      Speech Therapy Frequency  2x / week    Duration  4 weeks    Treatment/Interventions  SLP instruction and feedback;Internal/external aids;Compensatory strategies;Compensatory techniques;Patient/family education;Functional tasks;Cueing hierarchy;Multimodal communcation approach    Potential to Achieve Goals  Good    SLP Home Exercise Plan  Pt will be independent with HEP as assigned to facilitate carryover of treatment strategies and techniques in home environment with assist from caregiver as needed.     Consulted and Agree with Plan of Care  Patient;Family member/caregiver    Family Member Consulted  Spouse, [name]       Patient will benefit from skilled therapeutic intervention in order to improve the following deficits and impairments:   Aphasia  Cognitive communication deficit    Problem List Patient Active Problem List   Diagnosis Date Noted  . Gait disturbance, post-stroke 11/02/2017  . Aphasia as late effect of cerebrovascular accident 11/02/2017  . Left middle cerebral artery stroke ([place]) 10/07/2017  . Aphasia   . PAF (paroxysmal atrial fibrillation) ([place])   . Diabetes mellitus type 2 in obese ([place])   . Benign essential HTN   . Hypothyroidism   . Hyperlipidemia   . Stroke (cerebrum) ([place]) 10/02/2017  . Afib ([place]) 01/10/2017  . SOB (shortness of breath) 01/10/2017  . Acute diastolic CHF (congestive heart failure) ([place]) 01/10/2017  . Atrial fibrillation with RVR ([place]) 01/10/2017  . Claudication ([place]) 07/25/2016  . Paroxysmal atrial fibrillation ([place]) 07/25/2016  . Odynophagia 04/10/2014  . Allergic rhinitis 03/23/2014  . Chronic rhinitis  12/22/2013  . 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 Constipation 08/22/2011  . Obesity 09/20/2009  . G E R D 11/29/2007  . Celiac disease 11/29/2007   Thank you,  Genene Churn, [place]  [facility] 12/08/2017, 4:43 PM  [facility] [address] [place], [place], [address] Phone: [phone]   Fax:  [phone]   Name: JASHAD DEPAULA MRN: [id number] Date of Birth: 04-Jan-1929

## 2017-12-10 ENCOUNTER — Ambulatory Visit (HOSPITAL_COMMUNITY): Payer: PPO | Admitting: Speech Pathology

## 2017-12-10 ENCOUNTER — Other Ambulatory Visit: Payer: Self-pay

## 2017-12-10 ENCOUNTER — Ambulatory Visit (HOSPITAL_COMMUNITY): Payer: PPO

## 2017-12-10 ENCOUNTER — Encounter (HOSPITAL_COMMUNITY): Payer: Self-pay

## 2017-12-10 ENCOUNTER — Encounter (HOSPITAL_COMMUNITY): Payer: Self-pay | Admitting: Speech Pathology

## 2017-12-10 ENCOUNTER — Other Ambulatory Visit: Payer: Self-pay | Admitting: Physical Medicine & Rehabilitation

## 2017-12-10 DIAGNOSIS — R2689 Other abnormalities of gait and mobility: Secondary | ICD-10-CM

## 2017-12-10 DIAGNOSIS — R41841 Cognitive communication deficit: Secondary | ICD-10-CM

## 2017-12-10 DIAGNOSIS — Z9181 History of falling: Secondary | ICD-10-CM

## 2017-12-10 DIAGNOSIS — R4701 Aphasia: Secondary | ICD-10-CM

## 2017-12-10 DIAGNOSIS — R2681 Unsteadiness on feet: Secondary | ICD-10-CM

## 2017-12-10 DIAGNOSIS — I63512 Cerebral infarction due to unspecified occlusion or stenosis of left middle cerebral artery: Secondary | ICD-10-CM

## 2017-12-10 NOTE — Therapy (Signed)
[facility] [address] [place], [place], [address] Phone: [phone]   Fax:  [phone]  Physical Therapy Treatment  Patient Details  Name: [name] MRN: [id number] Date of Birth: 1929-01-18 Referring Provider: Alysia Penna, MD   Encounter Date: 12/10/2017  PT End of Session - 12/10/17 1429    Visit Number  13    Number of Visits  17    Date for PT Re-Evaluation  12/16/17    Authorization Type  [employer]    Authorization Time Period  10/21/17 - 12/16/17    PT Start Time  1431    Equipment Utilized During Treatment  Gait belt    Activity Tolerance  Patient limited by pain;No increased pain;Patient tolerated treatment well;Patient limited by fatigue    Behavior During Therapy  [facility] for tasks assessed/performed       Past Medical History:  Diagnosis Date  . Allergic rhinitis   . Asthmatic bronchitis   . Atrial fibrillation ([place])   . Celiac disease   . Cellulitis   . Diverticulosis   . DM (diabetes mellitus) ([place])   . Fx. left wrist 1987  . Gastric polyps   . GERD (gastroesophageal reflux disease)   . History of pneumonia    w/pleural effusion  . Hypothyroidism   . Iron deficiency anemia   . Melanoma ([place])    both feet  . Neuropathy   . Prostate cancer ([place])   . REM sleep behavior disorder   . RLS (restless legs syndrome)   . Sleep apnea   . Stroke [facility]) 09/2017    Past Surgical History:  Procedure Laterality Date  . APPENDECTOMY  1978  . CARPAL TUNNEL RELEASE Left   . CATARACT EXTRACTION  03/2011   bilateral   . CHOLECYSTECTOMY  2001  . COLONOSCOPY W/ BIOPSIES  04/27/2008   diverticulosis, duodenitis  . FOOT SURGERY    . HERNIA REPAIR  1994   bilateral  . KNEE SURGERY Bilateral    x 2  . LUNG SURGERY  2001   for infection  . PROSTATECTOMY  2002  . UPPER GASTROINTESTINAL ENDOSCOPY  04/27/2008   celiac disease, gastric polyps    There were no vitals filed for this visit.  Subjective  Assessment - 12/10/17 1430    Subjective  Patient is feeling alright today and states he did not get too worn out last session. He denies pain today and states he has performed his HEP regularly.     Limitations  Standing;Walking;House hold activities    How long can you sit comfortably?  unlimited    How long can you stand comfortably?  needs RW, 25 minutes easy    How long can you walk comfortably?  household distance    Patient Stated Goals  hopefully be able to walk and go to store and drive and all of those.     Currently in Pain?  No/denies       [facility] Adult PT Treatment/Exercise - 12/10/17 0001      Knee/Hip Exercises: Standing   Forward Step Up  Step Height: 2";Hand Hold: 0;10 reps;Both      Knee/Hip Exercises: Supine   Short Arc Quad Sets  Both;2 sets;10 reps    Straight Leg Raises  Both;1 set;10 reps       Balance Exercises - 12/10/17 1455      Balance Exercises: Standing   Balance Master: Limits for Stability  Level 7 no UE support, patient unable  to complete easy mode pattern in 4 minutes and session ended    Balance Master: Dynamic  Level 7 1 HHA. 2x 1:30 minutes, patietn with tendency to lean left wtih majority of tracing in left anterior and posterior quadrant. verbal cues needed to weigth shift into rigth leg. right fingertips for support    Tandem Gait  Forward;Retro;2 reps;Limitations        PT Education - 12/10/17 1430    Education provided  Yes    Education Details  Educated on weight shifting for balance with exercises and decreased verbal cues to facilitate increased reliance on intrinsic feedback fro learning. Instructed on exercise form throughout.    Person(s) Educated  Patient    Methods  Explanation;Verbal cues;Tactile cues    Comprehension  Verbalized understanding;Returned demonstration       PT Short Term Goals - 11/16/17 1449      PT SHORT TERM GOAL #1   Title  Patient will be independent with HEP and be able to demonstrate exercises to  improve balance and strength with greater independence.    Baseline  11/16/17 - at least 1x/day    Time  2    Period  Weeks    Status  Achieved      PT SHORT TERM GOAL #2   Title  Patient will improve 3MWT by 100 feet to improve activity tolerance and demonstrate safe gait velocity above 1.2 m/s to indicate reduced fall risk.    Baseline  11/16/17 - 352 feet today (0.6 m/s) patient ambulated 490 feet at eval    Time  4    Period  Weeks    Status  On-going      PT SHORT TERM GOAL #3   Title  Patient will be able to state 4 modifiable risk factors for stroke and report efforts to incorporate risk reducation at home to decrease recurrent stroke risk.    Baseline  11/16/17 - patient requires review and further education    Time  4    Period  Weeks    Status  On-going        PT Long Term Goals - 11/16/17 1627      PT LONG TERM GOAL #1   Title  Patient will improve distance with 3MWT by 150 feet and ambulate with LRAD at 1.2 m/s or faster to demonstrate safe gait velocity for community ambulation and reduced fall risk.    Baseline  11/16/17 - 352 feet today (0.6 m/s) patient ambulated 490 feet at eval    Time  8    Period  Weeks    Status  On-going      PT LONG TERM GOAL #2   Title  Patient will improve TUG to less than or equal to 13 seconds to demosntrate reduced fall risk.    Baseline  11/16/17 - 18.04 seconds with RW    Time  8    Period  Weeks    Status  On-going      PT LONG TERM GOAL #3   Title  Patient will demosntrate Five Time Sit to Stand in less than or equal to 13 seconds to demosntrate BLE strength improvements and reduce fall risk.    Baseline  11/16/17 - patient requires cues for safe sequencing    Time  8    Period  Weeks    Status  On-going        Plan - 12/10/17 1429    Clinical Impression Statement  Patient  is progressing in therapy and requires fewer verbal/tactile cues to weight shift and maintain upright posture during balance/biodex activity. He progressed this  training with no UE support compared to previously requiring 1 HHA. He demonstrated much improved postural control however continues to have a tendency to lean posteriorly. He required fewer rest breaks rest breaks throughout this session. His greatest limitation to exercise at this time is his right hip pain. [name] will continue to benefit from skilled PT services to address current impairments, decrease fall risk, and improve independence with functional mobility. Plan to re-evaluate next session.    Rehab Potential  Good    PT Frequency  2x / week    PT Duration  8 weeks    PT Treatment/Interventions  ADLs/Self Care Home Management;Electrical Stimulation;DME Instruction;Gait training;Stair training;Functional mobility training;Therapeutic activities;Therapeutic exercise;Balance training;Neuromuscular re-education;Patient/family education;Manual techniques;Passive range of motion;Energy conservation;Visual/perceptual remediation/compensation    PT Next Visit Plan  Re-assess next session and send certification for more visists. Continue with balance master/biodex next session and perform SAQ (hold on LAQ due to pain in right LE) and SLR for isolated quad strengthening. Single limb balance activities and dynamic gait will be beneficial and retro gait as well as tandem gait. Continue traingingstepping strategies to prevent falls and postural reactions for safe weights shifting to prevent LOB/falls.    PT Home Exercise Plan  marching, tandem stance and sit to stand; ankle movements with theraband    Consulted and Agree with Plan of Care  Patient       Patient will benefit from skilled therapeutic intervention in order to improve the following deficits and impairments:  Abnormal gait, Decreased knowledge of use of DME, Impaired sensation, Improper body mechanics, Decreased mobility, Decreased activity tolerance, Decreased endurance, Decreased strength, Decreased balance, Difficulty walking, Impaired  vision/preception  Visit Diagnosis: Left middle cerebral artery stroke (HCC)  Other abnormalities of gait and mobility  Unsteadiness on feet  History of falling     Problem List Patient Active Problem List   Diagnosis Date Noted  . Gait disturbance, post-stroke 11/02/2017  . Aphasia as late effect of cerebrovascular accident 11/02/2017  . 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 Aphasia   . PAF (paroxysmal atrial fibrillation) ([place])   . Diabetes mellitus type 2 in obese ([place])   . Benign essential HTN   . Hypothyroidism   . Hyperlipidemia   . Stroke (cerebrum) ([place]) 10/02/2017  . Afib ([place]) 01/10/2017  . SOB (shortness of breath) 01/10/2017  . Acute diastolic CHF (congestive heart failure) ([place]) 01/10/2017  . Atrial fibrillation with RVR ([place]) 01/10/2017  . Claudication ([place]) 07/25/2016  . Paroxysmal atrial fibrillation ([place]) 07/25/2016  . Odynophagia 04/10/2014  . Allergic rhinitis 03/23/2014  . Chronic rhinitis 12/22/2013  . 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 Constipation 08/22/2011  . Obesity 09/20/2009  . G E R D 11/29/2007  . Celiac disease 11/29/2007    Kipp Brood, PT, DPT Physical Therapist with [facility]  12/10/2017 3:41 PM    [place], [place], [address] Phone: [phone]   Fax:  [phone]  Name: [name] MRN: [id number] Date of Birth: August 11, 1929

## 2017-12-10 NOTE — Therapy (Signed)
[place], [place], [address] Phone: [phone]   Fax:  [phone]  Speech Language Pathology Treatment  Patient Details  Name: [name] MRN: [id number] Date of Birth: Mar 21, 1929 Referring Provider: Alysia Penna, MD   Encounter Date: 12/10/2017  End of Session - 12/10/17 1628    Visit Number  11    Number of Visits  16    Date for SLP Re-Evaluation  11/19/17    Authorization Type  [employer]    SLP Start Time  1520    SLP Stop Time   1615    SLP Time Calculation (min)  55 min    Activity Tolerance  Patient tolerated treatment well       Past Medical History:  Diagnosis Date  . Allergic rhinitis   . Asthmatic bronchitis   . Atrial fibrillation ([place])   . Celiac disease   . Cellulitis   . Diverticulosis   . DM (diabetes mellitus) ([place])   . Fx. left wrist 1987  . Gastric polyps   . GERD (gastroesophageal reflux disease)   . History of pneumonia    w/pleural effusion  . Hypothyroidism   . Iron deficiency anemia   . Melanoma ([place])    both feet  . Neuropathy   . Prostate cancer ([place])   . REM sleep behavior disorder   . RLS (restless legs syndrome)   . Sleep apnea   . Stroke [facility]) 09/2017    Past Surgical History:  Procedure Laterality Date  . APPENDECTOMY  1978  . CARPAL TUNNEL RELEASE Left   . CATARACT EXTRACTION  03/2011   bilateral   . CHOLECYSTECTOMY  2001  . COLONOSCOPY W/ BIOPSIES  04/27/2008   diverticulosis, duodenitis  . FOOT SURGERY    . HERNIA REPAIR  1994   bilateral  . KNEE SURGERY Bilateral    x 2  . LUNG SURGERY  2001   for infection  . PROSTATECTOMY  2002  . UPPER GASTROINTESTINAL ENDOSCOPY  04/27/2008   celiac disease, gastric polyps    There were no vitals filed for this visit.  Subjective Assessment - 12/10/17 1626    Subjective  "I think the second book is better than the first."    Currently in Pain?  No/denies            ADULT SLP  TREATMENT - 12/10/17 0001      General Information   Behavior/Cognition  Alert;Cooperative;Pleasant mood    Patient Positioning  Upright in chair    Oral care provided  N/A    HPI  [name] is an 84 yo male who presented to [facility] ED with stroke symptoms/aphasia. He was given tPA after head CT and transferred to [facility]. MRI showed acute left MCA territory infarct (temporal lobe). He eventually transferred to inpatient rehab before discharging home on 10/16/2017. He is referred to outpatient for ongoing SLP therapy to address cognitive communication changes with aphasia present. He is accompanied to today's evaluation by his wife, [name]. 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 He was driving and handled all finances at home. His wife is currently helping with both at this time. Mr. Poulson hopes to improve his speech and be able to communicate better with people since his stroke.       Treatment Provided   Treatment provided  Cognitive-Linquistic      Pain  Assessment   Pain Assessment  No/denies pain      Cognitive-Linquistic Treatment   Treatment focused on  Aphasia;Patient/family/caregiver education    Skilled Treatment  word finding strategies, word deduction, problem solving, oral reading and reading comprehension,       Assessment / Recommendations / Plan   Plan  Continue with current plan of care       SLP Education - 12/10/17 1627    Education provided  Yes    Education Details  Adjusted HEP to make more challenging and reviewed with his wife    Person(s) Educated  Patient;Spouse    Methods  Explanation;Handout;Verbal cues    Comprehension  Verbalized understanding       SLP Short Term Goals - 12/10/17 1629      SLP SHORT TERM GOAL #1   Title  Pt will implement word finding strategies with 90% accuracy when unable to verbalize desired word in conversation/functional tasks with indirect cues. change to  indirect cues    Baseline  75%    Time  4    Period  Weeks    Status  On-going      SLP SHORT TERM GOAL #2   Title  Pt will complete functional check writing and balancing tasks with 100% acc with min assist and use of compensatory strategies as needed.    Baseline  Pt previously independent and not completing at this time since stroke    Time  4    Period  Weeks    Status  Achieved      SLP SHORT TERM GOAL #3   Title  Pt will self-correct paraphasic errors 90% of the time during conversational speech with min cues.    Baseline  ~75% of the time    Time  4    Period  Weeks    Status  Achieved      SLP SHORT TERM GOAL #4   Title  Pt will complete moderate-level thought organization and planning activities with 90% acc and min assist.    Baseline  mod assist    Time  4    Period  Weeks    Status  On-going      SLP SHORT TERM GOAL #5   Title  Pt will increase moderately complex divergent naming tasks to 7+ items with min assist and use of strategies as needed.     Baseline  ~5-8 concrete    Time  4    Period  Weeks    Status  Achieved      SLP SHORT TERM GOAL #6   Title  Pt will verbally describe items by salient features (3+) with 100% acc with min assist from SLP.     Baseline  90% with mi/mod cues    Time  4    Period  Weeks    Status  On-going      SLP SHORT TERM GOAL #7   Title  Pt will orally read paragraph length material with 90% acc with cues for error awareness in order for Pt to self correct.     Baseline  80% acc mi/mod assist    Time  4    Period  Weeks    Status  On-going       SLP Long Term Goals - 12/10/17 1630      SLP LONG TERM GOAL #1   Title  Pt will express complex wants/needs to [id number] with use of compensatory strategies as needed.  Baseline  mi/mod impairment    Time  8    Period  Weeks    Status  On-going       Plan - 12/10/17 1628    Clinical Impression Statement Pt completed homework with 100% acc with relative ease. Homework for  tonight adjusted to make more challenging for patient and reviewed with wife. Pt completed moderately complex naming task with 95% acc with min cues (name brand name to product). He orally read sentences with 100% acc with allowance for self correction as needed (3x). He continues to implement circumlocution strategies in conversation and needs encouragement from SLP to take his time and avoid moving on in the conversation until he attempts to fully describe the word. Session reviewed with his spouse. Continue plan of care.    Speech Therapy Frequency  2x / week    Duration  4 weeks    Treatment/Interventions  SLP instruction and feedback;Internal/external aids;Compensatory strategies;Compensatory techniques;Patient/family education;Functional tasks;Cueing hierarchy;Multimodal communcation approach    Potential to Achieve Goals  Good    SLP Home Exercise Plan  Pt will be independent with HEP as assigned to facilitate carryover of treatment strategies and techniques in home environment with assist from caregiver as needed.     Consulted and Agree with Plan of Care  Patient;Family member/caregiver    Family Member Consulted  Spouse, [name]       Patient will benefit from skilled therapeutic intervention in order to improve the following deficits and impairments:   Aphasia  Cognitive communication deficit    Problem List Patient Active Problem List   Diagnosis Date Noted  . Gait disturbance, post-stroke 11/02/2017  . Aphasia as late effect of cerebrovascular accident 11/02/2017  . Left middle cerebral artery stroke ([place]) 10/07/2017  . Aphasia   . PAF (paroxysmal atrial fibrillation) ([place])   . Diabetes mellitus type 2 in obese ([place])   . Benign essential HTN   . Hypothyroidism   . Hyperlipidemia   . Stroke (cerebrum) ([place]) 10/02/2017  . Afib ([place]) 01/10/2017  . SOB (shortness of breath) 01/10/2017  . Acute diastolic CHF (congestive heart failure) ([place]) 01/10/2017  . Atrial  fibrillation with RVR ([place]) 01/10/2017  . Claudication ([place]) 07/25/2016  . Paroxysmal atrial fibrillation ([place]) 07/25/2016  . Odynophagia 04/10/2014  . Allergic rhinitis 03/23/2014  . Chronic rhinitis 12/22/2013  . 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 Constipation 08/22/2011  . Obesity 09/20/2009  . G E R D 11/29/2007  . Celiac disease 11/29/2007   Thank you,  Genene Churn, [place]  [facility] 12/10/2017, 4:31 PM  [place] [address] [place], [place], [address] Phone: [phone]   Fax:  [phone]   Name: CAHLIL SATTAR MRN: [id number] Date of Birth: 16-Jul-1929

## 2017-12-11 ENCOUNTER — Ambulatory Visit: Payer: PPO | Admitting: Podiatry

## 2017-12-11 DIAGNOSIS — Z6830 Body mass index (BMI) 30.0-30.9, adult: Secondary | ICD-10-CM | POA: Diagnosis not present

## 2017-12-11 DIAGNOSIS — L97921 Non-pressure chronic ulcer of unspecified part of left lower leg limited to breakdown of skin: Secondary | ICD-10-CM | POA: Diagnosis not present

## 2017-12-11 DIAGNOSIS — E782 Mixed hyperlipidemia: Secondary | ICD-10-CM | POA: Diagnosis not present

## 2017-12-11 DIAGNOSIS — D509 Iron deficiency anemia, unspecified: Secondary | ICD-10-CM | POA: Diagnosis not present

## 2017-12-11 DIAGNOSIS — Z712 Person consulting for explanation of examination or test findings: Secondary | ICD-10-CM | POA: Diagnosis not present

## 2017-12-11 DIAGNOSIS — E1142 Type 2 diabetes mellitus with diabetic polyneuropathy: Secondary | ICD-10-CM | POA: Diagnosis not present

## 2017-12-11 DIAGNOSIS — M21379 Foot drop, unspecified foot: Secondary | ICD-10-CM | POA: Diagnosis not present

## 2017-12-11 DIAGNOSIS — I69320 Aphasia following cerebral infarction: Secondary | ICD-10-CM | POA: Diagnosis not present

## 2017-12-11 DIAGNOSIS — I69351 Hemiplegia and hemiparesis following cerebral infarction affecting right dominant side: Secondary | ICD-10-CM | POA: Diagnosis not present

## 2017-12-11 DIAGNOSIS — R5383 Other fatigue: Secondary | ICD-10-CM | POA: Diagnosis not present

## 2017-12-11 DIAGNOSIS — I10 Essential (primary) hypertension: Secondary | ICD-10-CM | POA: Diagnosis not present

## 2017-12-11 DIAGNOSIS — L97811 Non-pressure chronic ulcer of other part of right lower leg limited to breakdown of skin: Secondary | ICD-10-CM | POA: Diagnosis not present

## 2017-12-17 ENCOUNTER — Ambulatory Visit (HOSPITAL_COMMUNITY): Payer: PPO | Admitting: Speech Pathology

## 2017-12-17 ENCOUNTER — Encounter (HOSPITAL_COMMUNITY): Payer: Self-pay | Admitting: Speech Pathology

## 2017-12-17 ENCOUNTER — Ambulatory Visit (HOSPITAL_COMMUNITY): Payer: PPO | Attending: Physical Medicine & Rehabilitation

## 2017-12-17 ENCOUNTER — Other Ambulatory Visit: Payer: Self-pay

## 2017-12-17 ENCOUNTER — Encounter (HOSPITAL_COMMUNITY): Payer: Self-pay

## 2017-12-17 DIAGNOSIS — Z9181 History of falling: Secondary | ICD-10-CM | POA: Diagnosis not present

## 2017-12-17 DIAGNOSIS — R2689 Other abnormalities of gait and mobility: Secondary | ICD-10-CM | POA: Insufficient documentation

## 2017-12-17 DIAGNOSIS — M25551 Pain in right hip: Secondary | ICD-10-CM | POA: Diagnosis not present

## 2017-12-17 DIAGNOSIS — M25552 Pain in left hip: Secondary | ICD-10-CM | POA: Insufficient documentation

## 2017-12-17 DIAGNOSIS — I63512 Cerebral infarction due to unspecified occlusion or stenosis of left middle cerebral artery: Secondary | ICD-10-CM | POA: Insufficient documentation

## 2017-12-17 DIAGNOSIS — R262 Difficulty in walking, not elsewhere classified: Secondary | ICD-10-CM | POA: Diagnosis not present

## 2017-12-17 DIAGNOSIS — R2681 Unsteadiness on feet: Secondary | ICD-10-CM | POA: Insufficient documentation

## 2017-12-17 DIAGNOSIS — R41841 Cognitive communication deficit: Secondary | ICD-10-CM | POA: Diagnosis not present

## 2017-12-17 DIAGNOSIS — R4701 Aphasia: Secondary | ICD-10-CM

## 2017-12-17 NOTE — Therapy (Signed)
[facility] [address] [place], [place], [address] Phone: [phone]   Fax:  [phone]  Physical Therapy Treatment/Discharge Summary  Patient Details  Name: [name] MRN: [id number] Date of Birth: 1929-02-13 Referring Provider: Alysia Penna, MD   Encounter Date: 12/17/2017   PHYSICAL THERAPY DISCHARGE SUMMARY  Visits from Start of Care: 14  Current functional level related to goals / functional outcomes: Re-Assessment performed today and patient has met/partially met 2/3 short term and no long-term goals. He has demonstrated improvements in muscle strength and made a significant increase in his Berg Balance scale score with an improvement form 25/56 to 32/56. He continues to remain a fall risk and had 1 episode of LOB today. His greatest limitation in therapy with functional strengthening and balance training remains his right hip pain. [name] will benefit from skilled PT services to address current impairments, but requires assessment and treatment of his right hip pain to progress in therapy. Therefore, he will be discharged from this episode and    Remaining deficits: See below details.    Education / Equipment: Educated remaining deficits and need for ongoing therapy. He was educated on discharging from this episode of care and returning for PT to address his right hip and continue to address his weakness and balance deficits. Patient agreeable to this plan.    Plan: Patient agrees to discharge.  Patient goals were partially met. Patient is being discharged due to the patient's request.  ?????      PT End of Session - 12/17/17 1359    Visit Number  14    Number of Visits  17    Date for PT Re-Evaluation  12/16/17    Authorization Type  [employer]    Authorization Time Period  10/21/17 - 12/16/17    PT Start Time  1345    PT Stop Time  1429    PT Time Calculation (min)  44 min    Equipment Utilized  During Treatment  Gait belt    Activity Tolerance  Patient tolerated treatment well;Patient limited by fatigue    Behavior During Therapy  WFL for tasks assessed/performed       Past Medical History:  Diagnosis Date  . Allergic rhinitis   . Asthmatic bronchitis   . Atrial fibrillation ([place])   . Celiac disease   . Cellulitis   . Diverticulosis   . DM (diabetes mellitus) ([place])   . Fx. left wrist 1987  . Gastric polyps   . GERD (gastroesophageal reflux disease)   . History of pneumonia    w/pleural effusion  . Hypothyroidism   . Iron deficiency anemia   . Melanoma ([place])    both feet  . Neuropathy   . Prostate cancer ([place])   . REM sleep behavior disorder   . RLS (restless legs syndrome)   . Sleep apnea   . Stroke [facility]) 09/2017    Past Surgical History:  Procedure Laterality Date  . APPENDECTOMY  1978  . CARPAL TUNNEL RELEASE Left   . CATARACT EXTRACTION  03/2011   bilateral   . CHOLECYSTECTOMY  2001  . COLONOSCOPY W/ BIOPSIES  04/27/2008   diverticulosis, duodenitis  . FOOT SURGERY    . HERNIA REPAIR  1994   bilateral  . KNEE SURGERY Bilateral    x 2  . LUNG SURGERY  2001   for infection  . PROSTATECTOMY  2002  . UPPER GASTROINTESTINAL ENDOSCOPY  04/27/2008   celiac  disease, gastric polyps    There were no vitals filed for this visit.  Subjective Assessment - 12/17/17 1354    Subjective  Patietn reports he has some pain in his right hip again today and that he is still tired from teh last session. He reports he has been doing his HEP.    Limitations  Standing;Walking;House hold activities    How long can you sit comfortably?  unlimited    How long can you stand comfortably?  needs RW, 25 minutes easy    How long can you walk comfortably?  household distance    Patient Stated Goals  hopefully be able to walk and go to store and drive and all of those.     Currently in Pain?  Yes    Pain Score  3     Pain Location  Hip    Pain Orientation  Right    Pain  Descriptors / Indicators  Aching;Sore    Pain Type  Chronic pain    Pain Onset  More than a month ago    Pain Frequency  Constant         [facility] PT Assessment - 12/17/17 0001      Assessment   Medical Diagnosis  L MCA CVA    Referring Provider  Alysia Penna, MD    Onset Date/Surgical Date  10/02/17    Hand Dominance  Right    Next MD Visit  --    Prior Therapy  [facility] PT, OT, ST      Precautions   Precautions  Fall      Observation/Other Assessments   Focus on Therapeutic Outcomes (FOTO)   48% limited (55% limited on 10/21/17)      Strength   Overall Strength Comments  BUE strength is 5/5    Right Hip Flexion  2+/5 was 4-/5    Left Hip Flexion  5/5    Right Knee Flexion  5/5    Right Knee Extension  5/5    Left Knee Flexion  5/5    Left Knee Extension  5/5    Right Ankle Dorsiflexion  3/5    Right Ankle Plantar Flexion  3/5    Left Ankle Dorsiflexion  3/5    Left Ankle Plantar Flexion  3/5      Transfers   Five time sit to stand comments   40 with Bil UE support and use of back of legs against chair for balance      Ambulation/Gait   Ambulation/Gait  Yes    Ambulation Distance (Feet)  366 Feet    Assistive device  Rolling walker    Gait Pattern  Step-through pattern;Decreased stride length;Decreased hip/knee flexion - left;Decreased hip/knee flexion - right;Decreased dorsiflexion - left;Decreased dorsiflexion - right;Left foot flat;Right foot flat;Trunk flexed;Wide base of support;Trendelenburg    Ambulation Surface  Level    Gait velocity  0.62 m/s    Stairs  Yes    Stairs Assistance  5: Supervision    Stairs Assistance Details (indicate cue type and reason)  min asssist to pervent LOB    Stair Management Technique  One rail Right;Alternating pattern;Forwards;Two rails    Number of Stairs  8    Height of Stairs  6    Gait Comments  Patient ascend/descend with step over step pattern and decreased safety with 1 hand rial instead of 2. Patient expeirienced left knee  buckeling while descendign stairs. No fall occured.      Standardized Balance  Assessment   Standardized Balance Assessment  Berg Balance Test      Berg Balance Test   Sit to Stand  Able to stand using hands after several tries    Standing Unsupported  Able to stand 2 minutes with supervision    Sitting with Back Unsupported but Feet Supported on Floor or Stool  Able to sit safely and securely 2 minutes    Stand to Sit  Uses backs of legs against chair to control descent    Transfers  Able to transfer safely, definite need of hands    Standing Unsupported with Eyes Closed  Able to stand 10 seconds with supervision    Standing Ubsupported with Feet Together  Able to place feet together independently and stand for 1 minute with supervision    From Standing, Reach Forward with Outstretched Arm  Can reach forward >12 cm safely (5")    From Standing Position, Pick up Object from Floor  Able to pick up shoe, needs supervision    From Standing Position, Turn to Look Behind Over each Shoulder  Turn sideways only but maintains balance    Turn 360 Degrees  Able to turn 360 degrees safely but slowly    Standing Unsupported, Alternately Place Feet on Step/Stool  Able to complete >2 steps/needs minimal assist    Standing Unsupported, One Foot in [employer] balance while stepping or standing    Standing on One Leg  Tries to lift leg/unable to hold 3 seconds but remains standing independently    Total Score  32    Berg comment:  was 25/56 on 10/21/17. Patient remains at severe risk of falling      Timed Up and Go Test   TUG  Normal TUG    Normal TUG (seconds)  18    TUG Comments  time of 18.04 seconds with RW is indicitive of fall risk        PT Education - 12/17/17 1555    Education provided  Yes    Education Details  Educated on discharging from this episode of carea nd returnign for PT to address Right Hip and balance deficits. Patient agreeable to this plan.     Person(s) Educated  Patient     Methods  Explanation    Comprehension  Verbalized understanding       PT Short Term Goals - 12/17/17 1403      PT SHORT TERM GOAL #1   Title  Patient will be independent with HEP and be able to demonstrate exercises to improve balance and strength with greater independence.    Baseline  11/16/17 - at least 1x/day    Time  2    Period  Weeks    Status  Achieved      PT SHORT TERM GOAL #2   Title  Patient will improve 3MWT by 100 feet to improve activity tolerance and demonstrate safe gait velocity above 1.2 m/s to indicate reduced fall risk.    Baseline  12/17/17 - 366 feet today (0.62 m/s) patient ambulated 490 feet at eval    Time  4    Period  Weeks    Status  On-going      PT SHORT TERM GOAL #3   Title  Patient will be able to state 4 modifiable risk factors for stroke and report efforts to incorporate risk reducation at home to decrease recurrent stroke risk.    Baseline  12/16/17 - patient understands exericise, and healthy diet reduce  risk of secodn stroke, patient is not a smoker    Time  4    Period  Weeks    Status  Partially Met        PT Long Term Goals - 12/17/17 1728      PT LONG TERM GOAL #1   Title  Patient will improve distance with 3MWT by 150 feet and ambulate with LRAD at 1.2 m/s or faster to demonstrate safe gait velocity for community ambulation and reduced fall risk.    Baseline  12/17/17 - 366 feet today (0.62 m/s) patient ambulated 490 feet at eval    Time  8    Period  Weeks    Status  On-going      PT LONG TERM GOAL #2   Title  Patient will improve TUG to less than or equal to 13 seconds to demosntrate reduced fall risk.    Baseline  12/17/17 - 18 seconds with RW    Time  8    Period  Weeks    Status  On-going      PT LONG TERM GOAL #3   Title  Patient will demosntrate Five Time Sit to Stand in less than or equal to 13 seconds to demosntrate BLE strength improvements and reduce fall risk.    Baseline  12/17/17 - patient requires cues for safe sequencing,  40 seconds, this is not an appropriate goal at this time    Time  8    Period  Weeks    Status  Revised        Plan - 12/17/17 1559    Clinical Impression Statement  Re-Assessment performed today and patient has met/partially met 2/3 short term and no long-term goals. He has demonstrated improvements in muscle strength and made a significant increase in his Berg Balance scale score with an improvement form 25/56 to 32/56. He continues to remain a fall risk and had 1 episode of LOB today. His greatest limitation in therapy with functional strengthening and balance training remains his right hip pain. Mr. Moss will benefit from skilled PT services to address current impairments, but requires assessment and treatment of his right hip pain to progress in therapy. Therefore, he will be discharged from this episode and     Rehab Potential  Good    PT Frequency  2x / week    PT Duration  8 weeks    PT Treatment/Interventions  ADLs/Self Care Home Management;Electrical Stimulation;DME Instruction;Gait training;Stair training;Functional mobility training;Therapeutic activities;Therapeutic exercise;Balance training;Neuromuscular re-education;Patient/family education;Manual techniques;Passive range of motion;Energy conservation;Visual/perceptual remediation/compensation    PT Next Visit Plan  Discharging patient from this episode of care and requesting a referral to evaluate and treat right hip pain, LE weakness and balance.    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 Gait disturbance, post-stroke 11/02/2017  . Aphasia  as late effect of cerebrovascular accident 11/02/2017  . 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 Aphasia   . PAF (paroxysmal atrial fibrillation) ([place])   . Diabetes mellitus type 2 in obese ([place])   . Benign essential HTN   . Hypothyroidism   . Hyperlipidemia   . Stroke (cerebrum) ([place]) 10/02/2017  . Afib ([place]) 01/10/2017  . SOB (shortness of breath) 01/10/2017  . Acute diastolic CHF (congestive heart failure) ([place]) 01/10/2017  . Atrial fibrillation with RVR ([place]) 01/10/2017  . Claudication ([place]) 07/25/2016  . Paroxysmal atrial fibrillation ([place]) 07/25/2016  . Odynophagia 04/10/2014  . Allergic rhinitis 03/23/2014  . Chronic rhinitis 12/22/2013  . 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 Constipation 08/22/2011  . Obesity 09/20/2009  . G E R D 11/29/2007  . Celiac disease 11/29/2007    Kipp Brood, PT, DPT Physical Therapist with [facility]  12/17/2017 5:46 PM    [facility] [address] [place], [place], [address] Phone: [phone]   Fax:  [phone]  Name: CORDERA STINEMAN MRN: [id number] Date of Birth: [birth year]

## 2017-12-17 NOTE — Therapy (Signed)
[facility] [place], [place], [address] Phone: [phone]   Fax:  [phone]  Speech Language Pathology Treatment  Patient Details  Name: [name] MRN: [id number] Date of Birth: 1929-08-20 Referring Provider: Alysia Penna, MD   Encounter Date: 12/17/2017  End of Session - 12/17/17 1756    Visit Number  12    Number of Visits  16    Date for SLP Re-Evaluation  11/19/17    Authorization Type  [employer]    SLP Start Time  1440    SLP Stop Time   1530    SLP Time Calculation (min)  50 min    Activity Tolerance  Patient tolerated treatment well       Past Medical History:  Diagnosis Date  . Allergic rhinitis   . Asthmatic bronchitis   . Atrial fibrillation ([place])   . Celiac disease   . Cellulitis   . Diverticulosis   . DM (diabetes mellitus) ([place])   . Fx. left wrist 1987  . Gastric polyps   . GERD (gastroesophageal reflux disease)   . History of pneumonia    w/pleural effusion  . Hypothyroidism   . Iron deficiency anemia   . Melanoma ([place])    both feet  . Neuropathy   . Prostate cancer ([place])   . REM sleep behavior disorder   . RLS (restless legs syndrome)   . Sleep apnea   . Stroke [facility]) 09/2017    Past Surgical History:  Procedure Laterality Date  . APPENDECTOMY  1978  . CARPAL TUNNEL RELEASE Left   . CATARACT EXTRACTION  03/2011   bilateral   . CHOLECYSTECTOMY  2001  . COLONOSCOPY W/ BIOPSIES  04/27/2008   diverticulosis, duodenitis  . FOOT SURGERY    . HERNIA REPAIR  1994   bilateral  . KNEE SURGERY Bilateral    x 2  . LUNG SURGERY  2001   for infection  . PROSTATECTOMY  2002  . UPPER GASTROINTESTINAL ENDOSCOPY  04/27/2008   celiac disease, gastric polyps    There were no vitals filed for this visit.  Subjective Assessment - 12/17/17 1755    Subjective  "I went to church on Sunday."    Currently in Pain?  No/denies       ADULT SLP TREATMENT - 12/17/17 0001      General Information   Behavior/Cognition  Alert;Cooperative;Pleasant mood    Patient Positioning  Upright in chair    Oral care provided  N/A    HPI  [name] is an 84 yo male who presented to [facility] ED with stroke symptoms/aphasia. He was given tPA after head CT and transferred to [facility]. MRI showed acute left MCA territory infarct (temporal lobe). He eventually transferred to inpatient rehab before discharging home on 10/16/2017. He is referred to outpatient for ongoing SLP therapy to address cognitive communication changes with aphasia present. He is accompanied to today's evaluation by his wife, [name]. 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 He was driving and handled all finances at home. His wife is currently helping with both at this time. [name] hopes to improve his speech and be able to communicate better with people since his stroke.       Treatment Provided   Treatment provided  Cognitive-Linquistic      Pain Assessment   Pain Assessment  No/denies pain  Cognitive-Linquistic Treatment   Treatment focused on  Aphasia;Patient/family/caregiver education    Skilled Treatment  word finding strategies, word deduction, problem solving, oral reading and reading comprehension,       Assessment / Recommendations / Plan   Plan  Continue with current plan of care         SLP Short Term Goals - 12/17/17 1757      SLP SHORT TERM GOAL #1   Title  Pt will implement word finding strategies with 90% accuracy when unable to verbalize desired word in conversation/functional tasks with indirect cues. changed to indirect cues    Baseline  75%    Time  4    Period  Weeks    Status  On-going    Target Date  01/17/18      SLP SHORT TERM GOAL #2   Title  Pt will complete functional check writing and balancing tasks with 100% acc with min assist and use of compensatory strategies as needed.    Baseline  Pt  previously independent and not completing at this time since stroke    Time  4    Period  Weeks    Status  Achieved      SLP SHORT TERM GOAL #3   Title  Pt will self-correct paraphasic errors 90% of the time during conversational speech with min cues.    Baseline  ~75% of the time    Time  4    Period  Weeks    Status  Achieved      SLP SHORT TERM GOAL #4   Title  Pt will complete moderate-level thought organization and planning activities with 90% acc and min assist.    Baseline  mod assist    Time  4    Period  Weeks    Status  On-going    Target Date  01/17/18      SLP SHORT TERM GOAL #5   Title  Pt will increase moderately complex divergent naming tasks to 7+ items with min assist and use of strategies as needed.     Baseline  ~5-8 concrete    Time  4    Period  Weeks    Status  Achieved      SLP SHORT TERM GOAL #6   Title  Pt will verbally describe items by salient features (3+) with 100% acc with min assist from SLP.     Baseline  90% with mi/mod cues    Time  4    Period  Weeks    Status  On-going    Target Date  01/17/18      SLP SHORT TERM GOAL #7   Title  Pt will orally read paragraph length material with 90% acc with cues for error awareness in order for Pt to self correct.     Baseline  80% acc mi/mod assist    Time  4    Period  Weeks    Status  On-going    Target Date  01/17/18       SLP Long Term Goals - 12/17/17 1801      SLP LONG TERM GOAL #1   Title  Pt will express complex wants/needs to [facility] with use of compensatory strategies as needed.    Baseline  mi/mod impairment    Time  8    Period  Weeks    Status  On-going    Target Date  01/17/18       Plan -  12/17/17 1757    Clinical Impression Statement [name] was alert and engaged throughout treatment session. He completed word description task with 100% acc with rare min cue to provide three salient features when describing a pictured object. SLP made activity more challenging by adding a  timed feature to task and Pt reported feeling flustered. Once task was timed, more paraphasic errors occurred. SLP and Pt discussed ways to reduce the "stress" he feels when task becomes more challenging. The activity was reversed and Pt asked to name a described object (low frequency words) and he completed with 80% acc and required written cues for errors. Oral reading continues to improve and Pt read aloud two paragraphs with 90% acc and one cue for error awareness. He was able to self correct the other paraphasic errors. Continue POC.   Speech Therapy Frequency  2x / week    Duration  4 weeks    Treatment/Interventions  SLP instruction and feedback;Internal/external aids;Compensatory strategies;Compensatory techniques;Patient/family education;Functional tasks;Cueing hierarchy;Multimodal communcation approach    Potential to Achieve Goals  Good    SLP Home Exercise Plan  Pt will be independent with HEP as assigned to facilitate carryover of treatment strategies and techniques in home environment with assist from caregiver as needed.     Consulted and Agree with Plan of Care  Patient;Family member/caregiver    Family Member Consulted  Spouse, [name]       Patient will benefit from skilled therapeutic intervention in order to improve the following deficits and impairments:   Aphasia  Cognitive communication deficit    Problem List Patient Active Problem List   Diagnosis Date Noted  . Gait disturbance, post-stroke 11/02/2017  . Aphasia as late effect of cerebrovascular accident 11/02/2017  . 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 Aphasia   . PAF (paroxysmal atrial fibrillation) ([place])   . Diabetes mellitus type 2 in obese ([place])   . Benign essential HTN   . Hypothyroidism   . Hyperlipidemia   . Stroke (cerebrum) ([place]) 10/02/2017  . Afib ([place]) 01/10/2017  . SOB (shortness of breath) 01/10/2017  . Acute diastolic CHF (congestive heart failure) ([place]) 01/10/2017  .  Atrial fibrillation with RVR ([place]) 01/10/2017  . Claudication ([place]) 07/25/2016  . Paroxysmal atrial fibrillation ([place]) 07/25/2016  . Odynophagia 04/10/2014  . Allergic rhinitis 03/23/2014  . Chronic rhinitis 12/22/2013  . 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 Constipation 08/22/2011  . Obesity 09/20/2009  . G E R D 11/29/2007  . Celiac disease 11/29/2007   Thank you,  Genene Churn, [place]  [facility] 12/17/2017, 6:03 PM  [facility] [address] [place], [place], [address] Phone: [phone]   Fax:  [phone]   Name: [name] MRN: [id number] Date of Birth: 08/29/1929

## 2017-12-22 ENCOUNTER — Ambulatory Visit (HOSPITAL_COMMUNITY): Payer: PPO

## 2017-12-22 ENCOUNTER — Telehealth (HOSPITAL_COMMUNITY): Payer: Self-pay | Admitting: Internal Medicine

## 2017-12-22 ENCOUNTER — Encounter (HOSPITAL_COMMUNITY): Payer: Self-pay | Admitting: Speech Pathology

## 2017-12-22 ENCOUNTER — Ambulatory Visit (HOSPITAL_COMMUNITY): Payer: PPO | Admitting: Speech Pathology

## 2017-12-22 DIAGNOSIS — I63512 Cerebral infarction due to unspecified occlusion or stenosis of left middle cerebral artery: Secondary | ICD-10-CM | POA: Diagnosis not present

## 2017-12-22 DIAGNOSIS — R41841 Cognitive communication deficit: Secondary | ICD-10-CM

## 2017-12-22 DIAGNOSIS — R4701 Aphasia: Secondary | ICD-10-CM

## 2017-12-22 NOTE — Therapy (Signed)
[place], [place], [address] Phone: [phone]   Fax:  [phone]  Speech Language Pathology Treatment  Patient Details  Name: [name] MRN: [id number] Date of Birth: [birth year] Referring Provider: Alysia Penna, MD   Encounter Date: 12/22/2017  End of Session - 12/22/17 1841    Visit Number  13    Number of Visits  16    Date for SLP Re-Evaluation  01/17/18    Authorization Type  [employer]    SLP Start Time  1430    SLP Stop Time   1515    SLP Time Calculation (min)  45 min    Activity Tolerance  Patient tolerated treatment well       Past Medical History:  Diagnosis Date  . Allergic rhinitis   . Asthmatic bronchitis   . Atrial fibrillation ([place])   . Celiac disease   . Cellulitis   . Diverticulosis   . DM (diabetes mellitus) ([place])   . Fx. left wrist 1987  . Gastric polyps   . GERD (gastroesophageal reflux disease)   . History of pneumonia    w/pleural effusion  . Hypothyroidism   . Iron deficiency anemia   . Melanoma ([place])    both feet  . Neuropathy   . Prostate cancer ([place])   . REM sleep behavior disorder   . RLS (restless legs syndrome)   . Sleep apnea   . Stroke [facility]) 09/2017    Past Surgical History:  Procedure Laterality Date  . APPENDECTOMY  1978  . CARPAL TUNNEL RELEASE Left   . CATARACT EXTRACTION  03/2011   bilateral   . CHOLECYSTECTOMY  2001  . COLONOSCOPY W/ BIOPSIES  04/27/2008   diverticulosis, duodenitis  . FOOT SURGERY    . HERNIA REPAIR  1994   bilateral  . KNEE SURGERY Bilateral    x 2  . LUNG SURGERY  2001   for infection  . PROSTATECTOMY  2002  . UPPER GASTROINTESTINAL ENDOSCOPY  04/27/2008   celiac disease, gastric polyps    There were no vitals filed for this visit.  Subjective Assessment - 12/22/17 1829    Subjective  "I gave the closing remarks at church on Sunday."    Currently in Pain?  No/denies      ADULT SLP TREATMENT -  12/22/17 0001      General Information   Behavior/Cognition  Alert;Cooperative;Pleasant mood    Patient Positioning  Upright in chair    Oral care provided  N/A    HPI  [name] is an 84 yo male who presented to [facility] ED with stroke symptoms/aphasia. He was given tPA after head CT and transferred to [facility]. MRI showed acute left MCA territory infarct (temporal lobe). He eventually transferred to inpatient rehab before discharging home on 10/16/2017. He is referred to outpatient for ongoing SLP therapy to address cognitive communication changes with aphasia present. He is accompanied to today's evaluation by his wife, [name]. 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 He was driving and handled all finances at home. His wife is currently helping with both at this time. [name] hopes to improve his speech and be able to communicate better with people since his stroke.       Treatment Provided   Treatment provided  Cognitive-Linquistic      Pain Assessment   Pain Assessment  No/denies  pain      Cognitive-Linquistic Treatment   Treatment focused on  Aphasia;Patient/family/caregiver education    Skilled Treatment  Skilled intervention targeting word finding skills in conversation and in structured tasks completed this session. [name] required min cues for error awareness during oral reading of paragraph length material for correct emphasis on multisyllabic words. He was able to provide word descriptions with 95% acc with min cues for expansion of responses and he named described and pictured objects with 100% acc with mi/mod initial phoneme and written cues.      Assessment / Recommendations / Plan   Plan  Continue with current plan of care         SLP Short Term Goals - 12/22/17 1844      SLP SHORT TERM GOAL #1   Title  Pt will implement word finding strategies with 90% accuracy when unable to verbalize desired  word in conversation/functional tasks with indirect cues. changed to indirect cues    Baseline  75%    Time  4    Period  Weeks    Status  On-going      SLP SHORT TERM GOAL #2   Title  Pt will complete functional check writing and balancing tasks with 100% acc with min assist and use of compensatory strategies as needed.    Baseline  Pt previously independent and not completing at this time since stroke    Time  4    Period  Weeks    Status  Achieved      SLP SHORT TERM GOAL #3   Title  Pt will self-correct paraphasic errors 90% of the time during conversational speech with min cues.    Baseline  ~75% of the time    Time  4    Period  Weeks    Status  Achieved      SLP SHORT TERM GOAL #4   Title  Pt will complete moderate-level thought organization and planning activities with 90% acc and min assist.    Baseline  mod assist    Time  4    Period  Weeks    Status  On-going      SLP SHORT TERM GOAL #5   Title  Pt will increase moderately complex divergent naming tasks to 7+ items with min assist and use of strategies as needed.     Baseline  ~5-8 concrete    Time  4    Period  Weeks    Status  Achieved      SLP SHORT TERM GOAL #6   Title  Pt will verbally describe items by salient features (3+) with 100% acc with min assist from SLP.     Baseline  90% with mi/mod cues    Time  4    Period  Weeks    Status  On-going      SLP SHORT TERM GOAL #7   Title  Pt will orally read paragraph length material with 90% acc with cues for error awareness in order for Pt to self correct.     Baseline  80% acc mi/mod assist    Time  4    Period  Weeks    Status  On-going       SLP Long Term Goals - 12/22/17 1844      SLP LONG TERM GOAL #1   Title  Pt will express complex wants/needs to [facility] with use of compensatory strategies as needed.    Baseline  mi/mod impairment    Time  8    Period  Weeks    Status  On-going       Plan - 12/22/17 1842    Clinical Impression Statement   [name] was alert and engaged throughout the session. He continues to make excellent progress toward goals. Paraphasic errors persist during spontaneous conversations with substitutions of he for she and vice versa. Continued SLP therapy is recommendend to target goals and increase independence with communication.     Speech Therapy Frequency  2x / week    Duration  4 weeks    Treatment/Interventions  SLP instruction and feedback;Internal/external aids;Compensatory strategies;Compensatory techniques;Patient/family education;Functional tasks;Cueing hierarchy;Multimodal communcation approach    Potential to Achieve Goals  Good    SLP Home Exercise Plan  Pt will be independent with HEP as assigned to facilitate carryover of treatment strategies and techniques in home environment with assist from caregiver as needed.     Consulted and Agree with Plan of Care  Patient;Family member/caregiver    Family Member Consulted  Spouse, [name]       Patient will benefit from skilled therapeutic intervention in order to improve the following deficits and impairments:   Aphasia  Cognitive communication deficit    Problem List Patient Active Problem List   Diagnosis Date Noted  . Gait disturbance, post-stroke 11/02/2017  . Aphasia as late effect of cerebrovascular accident 11/02/2017  . 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 Aphasia   . PAF (paroxysmal atrial fibrillation) ([place])   . Diabetes mellitus type 2 in obese ([place])   . Benign essential HTN   . Hypothyroidism   . Hyperlipidemia   . Stroke (cerebrum) ([place]) 10/02/2017  . Afib ([place]) 01/10/2017  . SOB (shortness of breath) 01/10/2017  . Acute diastolic CHF (congestive heart failure) ([place]) 01/10/2017  . Atrial fibrillation with RVR ([place]) 01/10/2017  . Claudication ([place]) 07/25/2016  . Paroxysmal atrial fibrillation ([place]) 07/25/2016  . Odynophagia 04/10/2014  . Allergic rhinitis 03/23/2014  . Chronic rhinitis 12/22/2013  .  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 Constipation 08/22/2011  . Obesity 09/20/2009  . G E R D 11/29/2007  . Celiac disease 11/29/2007   Thank you,  Genene Churn, [place]  [facility] 12/22/2017, 6:45 PM  [place] [address] [place], [place], [address] Phone: [phone]   Fax:  [phone]   Name: [name] MRN: [id number] Date of Birth: May 28, 1929

## 2017-12-22 NOTE — Telephone Encounter (Signed)
12/22/17  I spoke with Mrs. Congrove to see if they would want to come in today at 1:45 and begin PT on his hip.  She said that the therapist told him last week for him to take a week off before starting with that.  Mrs. Kau said she would talk with [place] today when they came and schedule something for next week.

## 2017-12-23 ENCOUNTER — Encounter: Payer: Self-pay | Admitting: Podiatry

## 2017-12-23 ENCOUNTER — Ambulatory Visit: Payer: PPO | Admitting: Podiatry

## 2017-12-23 DIAGNOSIS — L84 Corns and callosities: Secondary | ICD-10-CM | POA: Diagnosis not present

## 2017-12-23 DIAGNOSIS — D689 Coagulation defect, unspecified: Secondary | ICD-10-CM | POA: Diagnosis not present

## 2017-12-23 DIAGNOSIS — M79609 Pain in unspecified limb: Secondary | ICD-10-CM

## 2017-12-23 DIAGNOSIS — B351 Tinea unguium: Secondary | ICD-10-CM | POA: Diagnosis not present

## 2017-12-24 ENCOUNTER — Other Ambulatory Visit: Payer: Self-pay

## 2017-12-24 ENCOUNTER — Ambulatory Visit (HOSPITAL_COMMUNITY): Payer: PPO | Admitting: Speech Pathology

## 2017-12-24 ENCOUNTER — Encounter (HOSPITAL_COMMUNITY): Payer: Self-pay | Admitting: Speech Pathology

## 2017-12-24 DIAGNOSIS — I63512 Cerebral infarction due to unspecified occlusion or stenosis of left middle cerebral artery: Secondary | ICD-10-CM | POA: Diagnosis not present

## 2017-12-24 DIAGNOSIS — R41841 Cognitive communication deficit: Secondary | ICD-10-CM

## 2017-12-24 DIAGNOSIS — R4701 Aphasia: Secondary | ICD-10-CM

## 2017-12-24 NOTE — Therapy (Signed)
[name], [place], [address] Phone: [phone]   Fax:  [phone]  Speech Language Pathology Treatment  Patient Details  Name: [name] MRN: [id number] Date of Birth: Nov 08, 1928 Referring Provider: Alysia Penna, MD   Encounter Date: 12/24/2017  End of Session - 12/24/17 1645    Visit Number  14    Number of Visits  16    Date for SLP Re-Evaluation  01/17/18    Authorization Type  [employer]    SLP Start Time  7001    SLP Stop Time   1524    SLP Time Calculation (min)  47 min    Activity Tolerance  Patient tolerated treatment well       Past Medical History:  Diagnosis Date  . Allergic rhinitis   . Asthmatic bronchitis   . Atrial fibrillation ([place])   . Celiac disease   . Cellulitis   . Diverticulosis   . DM (diabetes mellitus) ([place])   . Fx. left wrist 1987  . Gastric polyps   . GERD (gastroesophageal reflux disease)   . History of pneumonia    w/pleural effusion  . Hypothyroidism   . Iron deficiency anemia   . Melanoma ([place])    both feet  . Neuropathy   . Prostate cancer ([place])   . REM sleep behavior disorder   . RLS (restless legs syndrome)   . Sleep apnea   . Stroke [name]) 09/2017    Past Surgical History:  Procedure Laterality Date  . APPENDECTOMY  1978  . CARPAL TUNNEL RELEASE Left   . CATARACT EXTRACTION  03/2011   bilateral   . CHOLECYSTECTOMY  2001  . COLONOSCOPY W/ BIOPSIES  04/27/2008   diverticulosis, duodenitis  . FOOT SURGERY    . HERNIA REPAIR  1994   bilateral  . KNEE SURGERY Bilateral    x 2  . LUNG SURGERY  2001   for infection  . PROSTATECTOMY  2002  . UPPER GASTROINTESTINAL ENDOSCOPY  04/27/2008   celiac disease, gastric polyps    There were no vitals filed for this visit.  Subjective Assessment - 12/24/17 1621    Subjective  "I had some trouble with homework."    Currently in Pain?  No/denies       ADULT SLP TREATMENT - 12/24/17 0001       General Information   Behavior/Cognition  Alert;Cooperative;Pleasant mood    Patient Positioning  Upright in chair    Oral care provided  N/A    HPI  [name] is an 84 yo male who presented to [facility] ED with stroke symptoms/aphasia. He was given tPA after head CT and transferred to [name]. MRI showed acute left MCA territory infarct (temporal lobe). He eventually transferred to inpatient rehab before discharging home on 10/16/2017. He is referred to outpatient for ongoing SLP therapy to address cognitive communication changes with aphasia present. He is accompanied to today's evaluation by his wife, [name]. 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 He was driving and handled all finances at home. His wife is currently helping with both at this time. [name] hopes to improve his speech and be able to communicate better with people since his stroke.       Treatment Provided   Treatment provided  Cognitive-Linquistic      Pain Assessment   Pain Assessment  No/denies pain  Cognitive-Linquistic Treatment   Treatment focused on  Aphasia;Patient/family/caregiver education    Skilled Treatment  Skilled treatment session focused on targeting expressive communication goals. SLP facilitated session by providing occasional phonemic and written cues when Pt experienced word finding difficulties in conversation. SLP further encouraged Pt to use association and salient features to provide addtional descriptors during word finding tasks. He orally read short paragraph with 90% acc independently and completed moderate level thought organization task with 88% acc with mi/mod cues. HEP assigned (following written directions) and session reviewed with spouse.      Assessment / Recommendations / Plan   Plan  Continue with current plan of care         SLP Short Term Goals - 12/24/17 1649      SLP SHORT TERM GOAL #1   Title  Pt  will implement word finding strategies with 90% accuracy when unable to verbalize desired word in conversation/functional tasks with indirect cues. changed to indirect cues    Baseline  75%    Time  4    Period  Weeks    Status  On-going      SLP SHORT TERM GOAL #2   Title  Pt will complete functional check writing and balancing tasks with 100% acc with min assist and use of compensatory strategies as needed.    Baseline  Pt previously independent and not completing at this time since stroke    Time  4    Period  Weeks    Status  Achieved      SLP SHORT TERM GOAL #3   Title  Pt will self-correct paraphasic errors 90% of the time during conversational speech with min cues.    Baseline  ~75% of the time    Time  4    Period  Weeks    Status  Achieved      SLP SHORT TERM GOAL #4   Title  Pt will complete moderate-level thought organization and planning activities with 90% acc and min assist.    Baseline  mod assist    Time  4    Period  Weeks    Status  On-going      SLP SHORT TERM GOAL #5   Title  Pt will increase moderately complex divergent naming tasks to 7+ items with min assist and use of strategies as needed.     Baseline  ~5-8 concrete    Time  4    Period  Weeks    Status  Achieved      SLP SHORT TERM GOAL #6   Title  Pt will verbally describe items by salient features (3+) with 100% acc with min assist from SLP.     Baseline  90% with mi/mod cues    Time  4    Period  Weeks    Status  On-going      SLP SHORT TERM GOAL #7   Title  Pt will orally read paragraph length material with 90% acc with cues for error awareness in order for Pt to self correct.     Baseline  80% acc mi/mod assist    Time  4    Period  Weeks    Status  On-going       SLP Long Term Goals - 12/24/17 1649      SLP LONG TERM GOAL #1   Title  Pt will express complex wants/needs to [name] with use of compensatory strategies as needed.    Baseline  mi/mod impairment    Time  8    Period  Weeks     Status  On-going       Plan - 12/24/17 1645    Clinical Impression Statement  [name] continues to make excellent progress toward goals and always completes HEP as assigned. He found that the most recent assignment was very challenging and needed assist from his wife to complete. He typically remembers to use word finding strategies in conversation and in structured tasks. Next session focus on additional thought organization tasks.    Speech Therapy Frequency  2x / week    Duration  4 weeks    Treatment/Interventions  SLP instruction and feedback;Internal/external aids;Compensatory strategies;Compensatory techniques;Patient/family education;Functional tasks;Cueing hierarchy;Multimodal communcation approach    Potential to Achieve Goals  Good    SLP Home Exercise Plan  Pt will be independent with HEP as assigned to facilitate carryover of treatment strategies and techniques in home environment with assist from caregiver as needed.     Consulted and Agree with Plan of Care  Patient;Family member/caregiver    Family Member Consulted  Spouse, [name]       Patient will benefit from skilled therapeutic intervention in order to improve the following deficits and impairments:   Cognitive communication deficit  Aphasia    Problem List Patient Active Problem List   Diagnosis Date Noted  . Gait disturbance, post-stroke 11/02/2017  . Aphasia as late effect of cerebrovascular accident 11/02/2017  . 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 Aphasia   . PAF (paroxysmal atrial fibrillation) ([place])   . Diabetes mellitus type 2 in obese ([place])   . Benign essential HTN   . Hypothyroidism   . Hyperlipidemia   . Stroke (cerebrum) ([place]) 10/02/2017  . Afib ([place]) 01/10/2017  . SOB (shortness of breath) 01/10/2017  . Acute diastolic CHF (congestive heart failure) ([place]) 01/10/2017  . Atrial fibrillation with RVR ([place]) 01/10/2017  . Claudication ([place]) 07/25/2016  . Paroxysmal  atrial fibrillation ([place]) 07/25/2016  . Odynophagia 04/10/2014  . Allergic rhinitis 03/23/2014  . Chronic rhinitis 12/22/2013  . 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 Constipation 08/22/2011  . Obesity 09/20/2009  . G E R D 11/29/2007  . Celiac disease 11/29/2007   Thank you,  Genene Churn, [place]  [facility] 12/24/2017, 4:50 PM  [place] [address] [place], [place], [address] Phone: [phone]   Fax:  [phone]   Name: [name] MRN: [id number] Date of Birth: 09/25/1929

## 2017-12-24 NOTE — Progress Notes (Signed)
Subjective:   Patient ID: [name], male   DOB: 84 y.o.   MRN: [id number]   HPI Patient presents with nail disease 1-5 both feet with long-term diabetes history of just having had a stroke on blood thinners and also has lesions on the big toes of both feet which are sore   ROS      Objective:  Physical Exam  Neurovascular status unchanged with thick yellow brittle nailbeds 1-5 both feet that are sore keratotic lesions on the hallux of both feet with patient currently on blood thinner secondary to having had a stroke earlier this year     Assessment:  Mycotic nail infection 1-5 both feet with thickness and moderate discomfort and lesion formation hallux bilateral with blood thinner history     Plan:  H&P conditions reviewed and debrided nailbeds 1-5 both feet with no iatrogenic bleeding and lesions on the hallux bilateral with no iatrogenic bleeding

## 2017-12-29 ENCOUNTER — Encounter (HOSPITAL_COMMUNITY): Payer: Self-pay | Admitting: Speech Pathology

## 2017-12-29 ENCOUNTER — Encounter (HOSPITAL_COMMUNITY): Payer: Self-pay | Admitting: Physical Therapy

## 2017-12-29 ENCOUNTER — Other Ambulatory Visit: Payer: Self-pay

## 2017-12-29 ENCOUNTER — Ambulatory Visit (HOSPITAL_COMMUNITY): Payer: PPO | Admitting: Physical Therapy

## 2017-12-29 ENCOUNTER — Ambulatory Visit (HOSPITAL_COMMUNITY): Payer: PPO | Admitting: Speech Pathology

## 2017-12-29 DIAGNOSIS — R262 Difficulty in walking, not elsewhere classified: Secondary | ICD-10-CM

## 2017-12-29 DIAGNOSIS — R4701 Aphasia: Secondary | ICD-10-CM

## 2017-12-29 DIAGNOSIS — R41841 Cognitive communication deficit: Secondary | ICD-10-CM

## 2017-12-29 DIAGNOSIS — I63512 Cerebral infarction due to unspecified occlusion or stenosis of left middle cerebral artery: Secondary | ICD-10-CM | POA: Diagnosis not present

## 2017-12-29 DIAGNOSIS — Z9181 History of falling: Secondary | ICD-10-CM

## 2017-12-29 DIAGNOSIS — M25552 Pain in left hip: Secondary | ICD-10-CM

## 2017-12-29 DIAGNOSIS — R2681 Unsteadiness on feet: Secondary | ICD-10-CM

## 2017-12-29 DIAGNOSIS — M25551 Pain in right hip: Secondary | ICD-10-CM

## 2017-12-29 NOTE — Therapy (Signed)
[facility] [place], [place], [address] Phone: [phone]   Fax:  [phone]  Physical Therapy Evaluation  Patient Details  Name: [name] MRN: [id number] Date of Birth: 1929/06/15 Referring Provider: Wende Neighbors    Encounter Date: 12/29/2017  PT End of Session - 12/29/17 1622    Visit Number  1    Number of Visits  12    Date for PT Re-Evaluation  02/09/18 Mini reasses:  01/19/2018    PT Start Time  1520    PT Stop Time  1610    PT Time Calculation (min)  50 min    Activity Tolerance  Patient tolerated treatment well       Past Medical History:  Diagnosis Date  . Allergic rhinitis   . Asthmatic bronchitis   . Atrial fibrillation ([place])   . Celiac disease   . Cellulitis   . Diverticulosis   . DM (diabetes mellitus) ([place])   . Fx. left wrist 1987  . Gastric polyps   . GERD (gastroesophageal reflux disease)   . History of pneumonia    w/pleural effusion  . Hypothyroidism   . Iron deficiency anemia   . Melanoma ([place])    both feet  . Neuropathy   . Prostate cancer ([place])   . REM sleep behavior disorder   . RLS (restless legs syndrome)   . Sleep apnea   . Stroke [facility]) 09/2017    Past Surgical History:  Procedure Laterality Date  . APPENDECTOMY  1978  . CARPAL TUNNEL RELEASE Left   . CATARACT EXTRACTION  03/2011   bilateral   . CHOLECYSTECTOMY  2001  . COLONOSCOPY W/ BIOPSIES  04/27/2008   diverticulosis, duodenitis  . FOOT SURGERY    . HERNIA REPAIR  1994   bilateral  . KNEE SURGERY Bilateral    x 2  . LUNG SURGERY  2001   for infection  . PROSTATECTOMY  2002  . UPPER GASTROINTESTINAL ENDOSCOPY  04/27/2008   celiac disease, gastric polyps    There were no vitals filed for this visit.   Subjective Assessment - 12/29/17 1523    Subjective  [name] states that he has had hip pain for years.  Both hips bother him, currently the right hip is bothering him more than his left.  When he lifts his  leg to get over the tub there is extreme pain.  He has been told that both hips need to be replaced. He has been sent to skilled physical therapy to attempt to improve his functional ability.     Pertinent History  CVA, OA     Limitations  Sitting;Lifting;Standing;Walking    How long can you sit comfortably?  two hours.     How long can you stand comfortably?  Pt has increased pain after 30 minutes if he leans against something .     How long can you walk comfortably?  PT states he has significant increased pain in his legs when he goes to walk.  He is not able to walk any greater than five minutes with his walker .      Patient Stated Goals  to improve whatever he can.       Currently in Pain?  Yes    Pain Score  5     Pain Location  Hip    Pain Orientation  Right    Pain Descriptors / Indicators  Aching;Throbbing    Pain Type  Chronic pain    Pain Onset  More than a month ago    Pain Frequency  Intermittent    Aggravating Factors   activity;  Pt has just finished physcial therapy for CVA and after therapy he was unable to sleep at night due to the pain.     Pain Relieving Factors  rest; heat     Effect of Pain on Daily Activities  limits          [facility] PT Assessment - 12/29/17 0001      Assessment   Medical Diagnosis  B hip pain with radiation     Referring Provider  Zack Hall     Onset Date/Surgical Date  11/30/17    Prior Therapy  not for this diagnosis for CVA       Precautions   Precautions  Fall      Restrictions   Weight Bearing Restrictions  No      Balance Screen   Has the patient fallen in the past 6 months  Yes    How many times?  6    Has the patient had a decrease in activity level because of a fear of falling?   Yes    Is the patient reluctant to leave their home because of a fear of falling?   Yes      Home Environment   Living Environment  Private residence    Living Arrangements  Spouse/significant other    Available Help at Discharge  Family    Type of  Bee to enter    Entrance Stairs-Number of Steps  -- [place]  One level      Prior Function   Level of Independence  Independent      Cognition   Overall Cognitive Status  Within Functional Limits for tasks assessed      Observation/Other Assessments   Focus on Therapeutic Outcomes (FOTO)   36 (64% limited)      Functional Tests   Functional tests  Sit to Stand      Single Leg Stance   Comments  unable       Sit to Stand   Comments  Needs B UE        ROM / Strength   AROM / PROM / Strength  Strength      Strength   Strength Assessment Site  Hip    Right/Left Hip  Right;Left    Right Hip Flexion  4-/5    Right Hip Extension  3-/5    Right Hip External Rotation   4-/5    Right Hip Internal Rotation  3/5    Right Hip ABduction  2/5    Left Hip Flexion  2+/5    Left Hip Extension  3-/5    Left Hip External Rotation  4/5    Left Hip Internal Rotation  2+/5    Left Hip ABduction  2/5    Right Knee Extension  5/5    Left Knee Extension  5/5    Right Ankle Dorsiflexion  2-/5    Left Ankle Dorsiflexion  1/5      Flexibility   Soft Tissue Assessment /Muscle Length  yes    Hamstrings  Rt 145; Lt 145    Piriformis  RT: 45; LT 45    Obturator Internus  IR of hip RT :0 ; LT -8  Objective measurements completed on examination: See above findings.      [facility] Adult PT Treatment/Exercise - 12/29/17 0001      Exercises   Exercises  Knee/Hip      Knee/Hip Exercises: Stretches   Passive Hamstring Stretch  Both;2 reps;30 seconds    Piriformis Stretch  Both;2 reps;30 seconds    Piriformis Stretch Limitations  PROM for IR as well       Knee/Hip Exercises: Supine   Hip Adduction Isometric  10 reps    Other Supine Knee/Hip Exercises  B HIp abduction x 10     Other Supine Knee/Hip Exercises  clam B x 5 rep              PT Education - 12/29/17 1619    Education provided  Yes    Education Details  HEP ;  The  importance of sitting and standing straight B     Person(s) Educated  Patient    Methods  Explanation;Handout       PT Short Term Goals - 12/29/17 1633      PT SHORT TERM GOAL #1   Title  PT hip B hip ROM to improve 6 degrees or more to ease donning and doffing of socks and shoes.     Time  3    Period  Weeks    Status  New    Target Date  01/19/18      PT SHORT TERM GOAL #2   Title  Pt pain to be decreased in both hips to no greater than a 6/10 to allow pt to be able to walk with his walker for 10 mintues     Time  3    Period  Weeks    Status  New      PT SHORT TERM GOAL #3   Title  PT Strength in B hips to increase one grade to allow pt to be able to come from sit to stand with one UE assist.     Time  3    Period  Weeks    Status  New      PT SHORT TERM GOAL #4   Title  PT to be able to single leg stance for 5 seconds bilaterally for reduce risk of falls.     Time  3    Period  Weeks        PT Long Term Goals - 12/29/17 1637      PT LONG TERM GOAL #1   Title  PT B hip mobility to be improved to allow pt to lift legs out of the bath with minimal difficulty     Time  6    Period  Weeks    Status  New    Target Date  02/09/18      PT LONG TERM GOAL #2   Title  Pt hip pain  B to be no greater than a 4/10 to allow pt to walk with his walker for 20 mintues.     Time  6    Period  Weeks    Status  New      PT LONG TERM GOAL #3   Title  PT hip strength B to be increased one grade to allow pt to go up and down 4 steps without fear of LE giving way.     Time  6    Period  Weeks      PT LONG TERM GOAL #4  Title  PT to be able to single leg stance on both legs for 10seconds to decrease risk of falling .     Time  6    Period  Weeks    Status  New             Plan - 12/29/17 1624    Clinical Impression Statement  Mr.  Siegert is an 84 yo male who has had chronic B hip pain for years.  He has had a recent stroke which has increased his symptoms.  He is now  being referred to skilled physical therapy to address his hip pain and attempt to improve his functioning mobiliity.  Evaluation demonstrates decreased power and strength of B hip, decreased ROM with decreased mobility increased pain and difficulty walking.  Mr. Witham will benefit from skilled physical therapy to address these issues and maximize his functional ability     History and Personal Factors relevant to plan of care:  HTN, DM, CVA, peripheral neuropathy.     Clinical Presentation  Stable    Clinical Decision Making  Moderate    Rehab Potential  Fair    PT Frequency  2x / week    PT Duration  6 weeks    PT Treatment/Interventions  ADLs/Self Care Home Management;Functional mobility training;Therapeutic activities;Therapeutic exercise;Patient/family education;Balance training;Manual techniques;Passive range of motion    PT Next Visit Plan  begin bridges, gentle contract relax to improve B hip ER and IR, begin bent knee raises as well as decommpression 1-5     PT Home Exercise Plan  EVAL:  Hip adduction isometric; supine hip abduction B, clam B     Consulted and Agree with Plan of Care  Patient       Patient will benefit from skilled therapeutic intervention in order to improve the following deficits and impairments:  Abnormal gait, Decreased activity tolerance, Decreased balance, Decreased mobility, Decreased range of motion, Difficulty walking, Increased fascial restricitons, Pain  Visit Diagnosis: Pain in right hip - Plan: PT plan of care cert/re-cert  Pain in left hip - Plan: PT plan of care cert/re-cert  Unsteadiness on feet - Plan: PT plan of care cert/re-cert  History of falling - Plan: PT plan of care cert/re-cert  Difficulty in walking, not elsewhere classified - Plan: PT plan of care cert/re-cert     Problem List Patient Active Problem List   Diagnosis Date Noted  . Gait disturbance, post-stroke 11/02/2017  . Aphasia as late effect of cerebrovascular accident  11/02/2017  . 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 Aphasia   . PAF (paroxysmal atrial fibrillation) ([place])   . Diabetes mellitus type 2 in obese ([place])   . Benign essential HTN   . Hypothyroidism   . Hyperlipidemia   . Stroke (cerebrum) ([place]) 10/02/2017  . Afib ([place]) 01/10/2017  . SOB (shortness of breath) 01/10/2017  . Acute diastolic CHF (congestive heart failure) ([place]) 01/10/2017  . Atrial fibrillation with RVR ([place]) 01/10/2017  . Claudication ([place]) 07/25/2016  . Paroxysmal atrial fibrillation ([place]) 07/25/2016  . Odynophagia 04/10/2014  . Allergic rhinitis 03/23/2014  . Chronic rhinitis 12/22/2013  . 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 Constipation 08/22/2011  . Obesity 09/20/2009  . G E R D 11/29/2007  . Celiac disease 11/29/2007    Rayetta Humphrey, PT CLT [phone] 12/29/2017, 4:44 PM  [facility] [address] [place], [place], [address] Phone: [phone]   Fax:  [phone]  Name: LADARIOUS KRESSE  MRN: [id number] Date of Birth: 1929/08/10

## 2017-12-29 NOTE — Patient Instructions (Addendum)
Hip Abduction / Adduction: with Knee Flexion (Supine)    With right knee bent, gently lower knee to side and return. Repeat __10__ times per set. Do ___1_ sets per session. Do 2____ sessions per day. Repeat with the left  http://orth.exer.us/682   Copyright  VHI. All rights reserved.  Hip Abduction / Adduction: with Extended Knee (Supine)    Bring left leg out to side and return. Keep knee straight. Repeat _10___ times per set. Do __1__ sets per session. Do _2___ sessions per day. Repeat with the left  http://orth.exer.us/680   Copyright  VHI. All rights reserved.  Strengthening: Hip Adduction - Isometric   Do lying down  With ball or folded pillow between knees, squeeze knees together. Hold __5_ seconds. Repeat _10 ___ times per set. Do __1__ sets per session. Do __2__ sessions per day.  http://orth.exer.us/612   Copyright  VHI. All rights reserved.

## 2017-12-29 NOTE — Therapy (Signed)
[facility] [place], [place], [address] Phone: [phone]   Fax:  [phone]  Speech Language Pathology Treatment  Patient Details  Name: [name] MRN: [id number] Date of Birth: 10/27/1928 Referring Provider: Alysia Penna, MD   Encounter Date: 12/29/2017  End of Session - 12/29/17 2047    Visit Number  15    Number of Visits  17    Date for SLP Re-Evaluation  01/17/18    Authorization Type  [employer]    SLP Start Time  1884    SLP Stop Time   1518    SLP Time Calculation (min)  44 min    Activity Tolerance  Patient tolerated treatment well       Past Medical History:  Diagnosis Date  . Allergic rhinitis   . Asthmatic bronchitis   . Atrial fibrillation ([place])   . Celiac disease   . Cellulitis   . Diverticulosis   . DM (diabetes mellitus) ([place])   . Fx. left wrist 1987  . Gastric polyps   . GERD (gastroesophageal reflux disease)   . History of pneumonia    w/pleural effusion  . Hypothyroidism   . Iron deficiency anemia   . Melanoma ([place])    both feet  . Neuropathy   . Prostate cancer ([place])   . REM sleep behavior disorder   . RLS (restless legs syndrome)   . Sleep apnea   . Stroke [facility]) 09/2017    Past Surgical History:  Procedure Laterality Date  . APPENDECTOMY  1978  . CARPAL TUNNEL RELEASE Left   . CATARACT EXTRACTION  03/2011   bilateral   . CHOLECYSTECTOMY  2001  . COLONOSCOPY W/ BIOPSIES  04/27/2008   diverticulosis, duodenitis  . FOOT SURGERY    . HERNIA REPAIR  1994   bilateral  . KNEE SURGERY Bilateral    x 2  . LUNG SURGERY  2001   for infection  . PROSTATECTOMY  2002  . UPPER GASTROINTESTINAL ENDOSCOPY  04/27/2008   celiac disease, gastric polyps    There were no vitals filed for this visit.  Subjective Assessment - 12/29/17 2046    Subjective  "I am doing fine."    Currently in Pain?  No/denies       ADULT SLP TREATMENT - 12/29/17 0001      General  Information   Behavior/Cognition  Alert;Cooperative;Pleasant mood    Patient Positioning  Upright in chair    Oral care provided  N/A    HPI  [name] is an 84 yo male who presented to [facility] ED with stroke symptoms/aphasia. He was given tPA after head CT and transferred to [facility]. MRI showed acute left MCA territory infarct (temporal lobe). He eventually transferred to inpatient rehab before discharging home on 10/16/2017. He is referred to outpatient for ongoing SLP therapy to address cognitive communication changes with aphasia present. He is accompanied to today's evaluation by his wife, [name]. 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 He was driving and handled all finances at home. His wife is currently helping with both at this time. [name] hopes to improve his speech and be able to communicate better with people since his stroke.       Treatment Provided   Treatment provided  Cognitive-Linquistic      Pain Assessment   Pain Assessment  No/denies pain  Cognitive-Linquistic Treatment   Treatment focused on  Aphasia;Patient/family/caregiver education    Skilled Treatment  Skilled treatment session focused on targeting expressive communication goals. SLP facilitated session by providing occasional phonemic and written cues when Pt experienced word finding difficulties in conversation. He orally read short paragraph with 95% acc independently. Previous session's assigned homework. Pt completed basic level reading comprehension/following written directions with 100% acc, but had difficulty when directions included "if/then" directives.      Assessment / Recommendations / Plan   Plan  Continue with current plan of care         SLP Short Term Goals - 12/29/17 2047      SLP SHORT TERM GOAL #1   Title  Pt will implement word finding strategies with 90% accuracy when unable to verbalize desired word in  conversation/functional tasks with indirect cues. changed to indirect cues    Baseline  75%    Time  4    Period  Weeks    Status  On-going      SLP SHORT TERM GOAL #2   Title  Pt will complete functional check writing and balancing tasks with 100% acc with min assist and use of compensatory strategies as needed.    Baseline  Pt previously independent and not completing at this time since stroke    Time  4    Period  Weeks    Status  Achieved      SLP SHORT TERM GOAL #3   Title  Pt will self-correct paraphasic errors 90% of the time during conversational speech with min cues.    Baseline  ~75% of the time    Time  4    Period  Weeks    Status  Achieved      SLP SHORT TERM GOAL #4   Title  Pt will complete moderate-level thought organization and planning activities with 90% acc and min assist.    Baseline  mod assist    Time  4    Period  Weeks    Status  On-going      SLP SHORT TERM GOAL #5   Title  Pt will increase moderately complex divergent naming tasks to 7+ items with min assist and use of strategies as needed.     Baseline  ~5-8 concrete    Time  4    Period  Weeks    Status  Achieved      SLP SHORT TERM GOAL #6   Title  Pt will verbally describe items by salient features (3+) with 100% acc with min assist from SLP.     Baseline  90% with mi/mod cues    Time  4    Period  Weeks    Status  On-going      SLP SHORT TERM GOAL #7   Title  Pt will orally read paragraph length material with 90% acc with cues for error awareness in order for Pt to self correct.     Baseline  80% acc mi/mod assist    Time  4    Period  Weeks    Status  On-going       SLP Long Term Goals - 12/24/17 1649      SLP LONG TERM GOAL #1   Title  Pt will express complex wants/needs to [facility] with use of compensatory strategies as needed.    Baseline  mi/mod impairment    Time  8    Period  Weeks  Status  On-going       Plan - 12/29/17 2047    Clinical Impression Statement [name]  remains motivated in sessions and continues to implement strategies to facilitate word finding in conversation. SLP discussed plan for discharge from therapy in the next 1-2 visits with plans to continue with home program. Pt pleased with his progress.    Speech Therapy Frequency  2x / week    Duration  4 weeks    Treatment/Interventions  SLP instruction and feedback;Internal/external aids;Compensatory strategies;Compensatory techniques;Patient/family education;Functional tasks;Cueing hierarchy;Multimodal communcation approach    Potential to Achieve Goals  Good    SLP Home Exercise Plan  Pt will be independent with HEP as assigned to facilitate carryover of treatment strategies and techniques in home environment with assist from caregiver as needed.     Consulted and Agree with Plan of Care  Patient;Family member/caregiver    Family Member Consulted  Spouse, [name]       Patient will benefit from skilled therapeutic intervention in order to improve the following deficits and impairments:   Cognitive communication deficit  Aphasia    Problem List Patient Active Problem List   Diagnosis Date Noted  . Gait disturbance, post-stroke 11/02/2017  . Aphasia as late effect of cerebrovascular accident 11/02/2017  . 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 Aphasia   . PAF (paroxysmal atrial fibrillation) ([place])   . Diabetes mellitus type 2 in obese ([place])   . Benign essential HTN   . Hypothyroidism   . Hyperlipidemia   . Stroke (cerebrum) ([place]) 10/02/2017  . Afib ([place]) 01/10/2017  . SOB (shortness of breath) 01/10/2017  . Acute diastolic CHF (congestive heart failure) ([place]) 01/10/2017  . Atrial fibrillation with RVR ([place]) 01/10/2017  . Claudication ([place]) 07/25/2016  . Paroxysmal atrial fibrillation ([place]) 07/25/2016  . Odynophagia 04/10/2014  . Allergic rhinitis 03/23/2014  . Chronic rhinitis 12/22/2013  . 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 Constipation 08/22/2011  . Obesity  09/20/2009  . G E R D 11/29/2007  . Celiac disease 11/29/2007   Thank you,  [name], [place]  [facility] 12/29/2017, 8:48 PM  [facility] [address] [place], [place], [address] Phone: [phone]   Fax:  [phone]   Name: [name] MRN: [id number] Date of Birth: 13-Sep-1929

## 2017-12-31 ENCOUNTER — Ambulatory Visit (HOSPITAL_COMMUNITY): Payer: PPO

## 2017-12-31 ENCOUNTER — Encounter (HOSPITAL_COMMUNITY): Payer: Self-pay | Admitting: Speech Pathology

## 2017-12-31 ENCOUNTER — Ambulatory Visit (HOSPITAL_COMMUNITY): Payer: PPO | Admitting: Speech Pathology

## 2017-12-31 ENCOUNTER — Encounter (HOSPITAL_COMMUNITY): Payer: Self-pay

## 2017-12-31 DIAGNOSIS — M25551 Pain in right hip: Secondary | ICD-10-CM

## 2017-12-31 DIAGNOSIS — Z9181 History of falling: Secondary | ICD-10-CM

## 2017-12-31 DIAGNOSIS — M25552 Pain in left hip: Secondary | ICD-10-CM

## 2017-12-31 DIAGNOSIS — I63512 Cerebral infarction due to unspecified occlusion or stenosis of left middle cerebral artery: Secondary | ICD-10-CM | POA: Diagnosis not present

## 2017-12-31 DIAGNOSIS — R2681 Unsteadiness on feet: Secondary | ICD-10-CM

## 2017-12-31 DIAGNOSIS — R4701 Aphasia: Secondary | ICD-10-CM

## 2017-12-31 DIAGNOSIS — R41841 Cognitive communication deficit: Secondary | ICD-10-CM

## 2017-12-31 DIAGNOSIS — R262 Difficulty in walking, not elsewhere classified: Secondary | ICD-10-CM

## 2017-12-31 NOTE — Therapy (Signed)
[facility] [place], [place], [address] Phone: [phone]   Fax:  [phone]  Speech Language Pathology Treatment  Patient Details  Name: [name] MRN: [id number] Date of Birth: August 16, 1929 Referring Provider: Alysia Penna, MD   Encounter Date: 12/31/2017  End of Session - 12/31/17 1846    Visit Number  16    Number of Visits  17    Date for SLP Re-Evaluation  01/17/18    Authorization Type  [employer]    SLP Start Time  1430    SLP Stop Time   1515    SLP Time Calculation (min)  45 min    Activity Tolerance  Patient tolerated treatment well       Past Medical History:  Diagnosis Date  . Allergic rhinitis   . Asthmatic bronchitis   . Atrial fibrillation ([place])   . Celiac disease   . Cellulitis   . Diverticulosis   . DM (diabetes mellitus) ([place])   . Fx. left wrist 1987  . Gastric polyps   . GERD (gastroesophageal reflux disease)   . History of pneumonia    w/pleural effusion  . Hypothyroidism   . Iron deficiency anemia   . Melanoma ([place])    both feet  . Neuropathy   . Prostate cancer ([place])   . REM sleep behavior disorder   . RLS (restless legs syndrome)   . Sleep apnea   . Stroke [facility]) 09/2017    Past Surgical History:  Procedure Laterality Date  . APPENDECTOMY  1978  . CARPAL TUNNEL RELEASE Left   . CATARACT EXTRACTION  03/2011   bilateral   . CHOLECYSTECTOMY  2001  . COLONOSCOPY W/ BIOPSIES  04/27/2008   diverticulosis, duodenitis  . FOOT SURGERY    . HERNIA REPAIR  1994   bilateral  . KNEE SURGERY Bilateral    x 2  . LUNG SURGERY  2001   for infection  . PROSTATECTOMY  2002  . UPPER GASTROINTESTINAL ENDOSCOPY  04/27/2008   celiac disease, gastric polyps    There were no vitals filed for this visit.  Subjective Assessment - 12/31/17 1837    Subjective  "I did work on my homework."    Currently in Pain?  No/denies        ADULT SLP TREATMENT - 12/31/17 0001      General Information   Behavior/Cognition  Alert;Cooperative;Pleasant mood    Patient Positioning  Upright in chair    Oral care provided  N/A    HPI  [name] is an 84 yo male who presented to [facility] with stroke symptoms/aphasia. He was given tPA after head CT and transferred to [facility]. MRI showed acute left MCA territory infarct (temporal lobe). He eventually transferred to inpatient rehab before discharging home on 10/16/2017. He is referred to outpatient for ongoing SLP therapy to address cognitive communication changes with aphasia present. He is accompanied to today's evaluation by his wife, [name]. 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 He was driving and handled all finances at home. His wife is currently helping with both at this time. [name] hopes to improve his speech and be able to communicate better with people since his stroke.       Treatment Provided   Treatment provided  Cognitive-Linquistic      Pain Assessment   Pain Assessment  No/denies pain  Cognitive-Linquistic Treatment   Treatment focused on  Aphasia;Patient/family/caregiver education    Skilled Treatment  Skilled treatment session focused on targeting expressive communication goals. Mr. Husak completed analogies with 95% acc with occasional verbal cues from SLP. He orally read sentences with 100% acc with one cue for correct stress on a specific word. During conversation, SLP supplied probing questions to encourage Pt to expand upon utterances. He utilized word finding strategies independently, but did require an occasional cue to select the accurate word.       Assessment / Recommendations / Plan   Plan  Continue with current plan of care         SLP Short Term Goals - 12/31/17 1849      SLP SHORT TERM GOAL #1   Title  Pt will implement word finding strategies with 90% accuracy when unable to verbalize desired word in  conversation/functional tasks with indirect cues. changed to indirect cues    Baseline  75%    Time  4    Period  Weeks    Status  On-going      SLP SHORT TERM GOAL #2   Title  Pt will complete functional check writing and balancing tasks with 100% acc with min assist and use of compensatory strategies as needed.    Baseline  Pt previously independent and not completing at this time since stroke    Time  4    Period  Weeks    Status  Achieved      SLP SHORT TERM GOAL #3   Title  Pt will self-correct paraphasic errors 90% of the time during conversational speech with min cues.    Baseline  ~75% of the time    Time  4    Period  Weeks    Status  Achieved      SLP SHORT TERM GOAL #4   Title  Pt will complete moderate-level thought organization and planning activities with 90% acc and min assist.    Baseline  mod assist    Time  4    Period  Weeks    Status  On-going      SLP SHORT TERM GOAL #5   Title  Pt will increase moderately complex divergent naming tasks to 7+ items with min assist and use of strategies as needed.     Baseline  ~5-8 concrete    Time  4    Period  Weeks    Status  Achieved      SLP SHORT TERM GOAL #6   Title  Pt will verbally describe items by salient features (3+) with 100% acc with min assist from SLP.     Baseline  90% with mi/mod cues    Time  4    Period  Weeks    Status  On-going      SLP SHORT TERM GOAL #7   Title  Pt will orally read paragraph length material with 90% acc with cues for error awareness in order for Pt to self correct.     Baseline  80% acc mi/mod assist    Time  4    Period  Weeks    Status  On-going       SLP Long Term Goals - 12/31/17 1849      SLP LONG TERM GOAL #1   Title  Pt will express complex wants/needs to [facility] with use of compensatory strategies as needed.    Baseline  mi/mod impairment    Time  8    Period  Weeks    Status  On-going       Plan - 12/31/17 1847    Clinical Impression Statement  [name]  continues to make excellent progress toward goals. He presents with mild expressive language deficits characterized by occasional dysnomia and paraphasic errors. He consistently uses compensatory strategies and now with indirect cues. Recommend one more treatment session and d/c to home program. Pt and spouse in agreement with plan of care.     Speech Therapy Frequency  2x / week    Duration  4 weeks    Treatment/Interventions  SLP instruction and feedback;Internal/external aids;Compensatory strategies;Compensatory techniques;Patient/family education;Functional tasks;Cueing hierarchy;Multimodal communcation approach    Potential to Achieve Goals  Good    SLP Home Exercise Plan  Pt will be independent with HEP as assigned to facilitate carryover of treatment strategies and techniques in home environment with assist from caregiver as needed.     Consulted and Agree with Plan of Care  Patient;Family member/caregiver    Family Member Consulted  Spouse, [name]       Patient will benefit from skilled therapeutic intervention in order to improve the following deficits and impairments:   Aphasia  Cognitive communication deficit    Problem List Patient Active Problem List   Diagnosis Date Noted  . Gait disturbance, post-stroke 11/02/2017  . Aphasia as late effect of cerebrovascular accident 11/02/2017  . 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 Aphasia   . PAF (paroxysmal atrial fibrillation) ([place])   . Diabetes mellitus type 2 in obese ([place])   . Benign essential HTN   . Hypothyroidism   . Hyperlipidemia   . Stroke (cerebrum) ([place]) 10/02/2017  . Afib ([place]) 01/10/2017  . SOB (shortness of breath) 01/10/2017  . Acute diastolic CHF (congestive heart failure) ([place]) 01/10/2017  . Atrial fibrillation with RVR ([place]) 01/10/2017  . Claudication ([place]) 07/25/2016  . Paroxysmal atrial fibrillation ([place]) 07/25/2016  . Odynophagia 04/10/2014  . Allergic rhinitis 03/23/2014  . Chronic  rhinitis 12/22/2013  . 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 Constipation 08/22/2011  . Obesity 09/20/2009  . G E R D 11/29/2007  . Celiac disease 11/29/2007   Thank you,  Genene Churn, [place]  [facility] 12/31/2017, 6:50 PM  [facility] [address] [place], [place], [address] Phone: [phone]   Fax:  [phone]   Name: [name] MRN: [id number] Date of Birth: 11/04/1928

## 2017-12-31 NOTE — Therapy (Signed)
[place], [place], [address] Phone: [phone]   Fax:  [phone]  Physical Therapy Treatment  Patient Details  Name: [name] MRN: [id number] Date of Birth: 15-Aug-1929 Referring Provider: Wende Neighbors    Encounter Date: 12/31/2017  PT End of Session - 12/31/17 1341    Visit Number  2    Number of Visits  12    Date for PT Re-Evaluation  02/09/18 Mini reasses:  01/19/2018    PT Start Time  1342    PT Stop Time  1424    PT Time Calculation (min)  42 min    Activity Tolerance  Patient tolerated treatment well       Past Medical History:  Diagnosis Date  . Allergic rhinitis   . Asthmatic bronchitis   . Atrial fibrillation ([place])   . Celiac disease   . Cellulitis   . Diverticulosis   . DM (diabetes mellitus) ([place])   . Fx. left wrist 1987  . Gastric polyps   . GERD (gastroesophageal reflux disease)   . History of pneumonia    w/pleural effusion  . Hypothyroidism   . Iron deficiency anemia   . Melanoma ([place])    both feet  . Neuropathy   . Prostate cancer ([place])   . REM sleep behavior disorder   . RLS (restless legs syndrome)   . Sleep apnea   . Stroke [facility]) 09/2017    Past Surgical History:  Procedure Laterality Date  . APPENDECTOMY  1978  . CARPAL TUNNEL RELEASE Left   . CATARACT EXTRACTION  03/2011   bilateral   . CHOLECYSTECTOMY  2001  . COLONOSCOPY W/ BIOPSIES  04/27/2008   diverticulosis, duodenitis  . FOOT SURGERY    . HERNIA REPAIR  1994   bilateral  . KNEE SURGERY Bilateral    x 2  . LUNG SURGERY  2001   for infection  . PROSTATECTOMY  2002  . UPPER GASTROINTESTINAL ENDOSCOPY  04/27/2008   celiac disease, gastric polyps    There were no vitals filed for this visit.  Subjective Assessment - 12/31/17 1341    Subjective  Pt reports he is feeling fair. He states that right now his hips aren't too bad becuase they have gotten to rest.    Pertinent History  CVA, OA     Limitations  Sitting;Lifting;Standing;Walking    How long can you sit comfortably?  two hours.     How long can you stand comfortably?  Pt has increased pain after 30 minutes if he leans against something .     How long can you walk comfortably?  PT states he has significant increased pain in his legs when he goes to walk.  He is not able to walk any greater than five minutes with his walker .      Patient Stated Goals  to improve whatever he can.       Currently in Pain?  Yes    Pain Score  2     Pain Location  Hip    Pain Orientation  Right    Pain Descriptors / Indicators  Aching;Throbbing    Pain Type  Chronic pain    Pain Onset  More than a month ago    Pain Frequency  Intermittent    Aggravating Factors   activity, pt has just finished physical therapy for CVA and after therapy he was unable to sleep at night due to the pain  Pain Relieving Factors  rest; heat    Effect of Pain on Daily Activities  limits           [facility] Adult PT Treatment/Exercise - 12/31/17 0001      Knee/Hip Exercises: Stretches   Passive Hamstring Stretch  Both;2 reps;30 seconds;Limitations    Passive Hamstring Stretch Limitations  supine with rope    Piriformis Stretch  Both;2 reps;30 seconds    Piriformis Stretch Limitations  PROM for IR as well     Other Knee/Hip Stretches  LTRs for hip IR 5x5" holds each      Knee/Hip Exercises: Seated   Marching  Both;10 reps      Knee/Hip Exercises: Supine   Terminal Knee Extension  --    Terminal Knee Extension Limitations  Bent knee raise 2x10 reps each    Bridges  Both;2 sets;10 reps    Other Supine Knee/Hip Exercises  decompression 1-5  x10 reps each            PT Education - 12/31/17 1347    Education provided  Yes    Education Details  reveiwed goals; exercise technique    Person(s) Educated  Patient    Methods  Explanation;Demonstration;Handout    Comprehension  Verbalized understanding;Returned demonstration       PT Short Term Goals -  12/29/17 1633      PT SHORT TERM GOAL #1   Title  PT hip B hip ROM to improve 6 degrees or more to ease donning and doffing of socks and shoes.     Time  3    Period  Weeks    Status  New    Target Date  01/19/18      PT SHORT TERM GOAL #2   Title  Pt pain to be decreased in both hips to no greater than a 6/10 to allow pt to be able to walk with his walker for 10 mintues     Time  3    Period  Weeks    Status  New      PT SHORT TERM GOAL #3   Title  PT Strength in B hips to increase one grade to allow pt to be able to come from sit to stand with one UE assist.     Time  3    Period  Weeks    Status  New      PT SHORT TERM GOAL #4   Title  PT to be able to single leg stance for 5 seconds bilaterally for reduce risk of falls.     Time  3    Period  Weeks        PT Long Term Goals - 12/29/17 1637      PT LONG TERM GOAL #1   Title  PT B hip mobility to be improved to allow pt to lift legs out of the bath with minimal difficulty     Time  6    Period  Weeks    Status  New    Target Date  02/09/18      PT LONG TERM GOAL #2   Title  Pt hip pain  B to be no greater than a 4/10 to allow pt to walk with his walker for 20 mintues.     Time  6    Period  Weeks    Status  New      PT LONG TERM GOAL #3   Title  PT hip  strength B to be increased one grade to allow pt to go up and down 4 steps without fear of LE giving way.     Time  6    Period  Weeks      PT LONG TERM GOAL #4   Title  PT to be able to single leg stance on both legs for 10seconds to decrease risk of falling .     Time  6    Period  Weeks    Status  New            Plan - 12/31/17 1425    Clinical Impression Statement  Began session by reviewing goals and issuing copy of evaluation with no f/u questions. He reports compliance with HEP. Today's session focused on improving flexibility and overall strength. Pt required min cues for proper exercise technique. Added LTRs for hip IR ROM. He was noted to have  increased stiffness in LLE throughout. he was also noted to have very tight calves so PT recommending adding calf stretching to POC. Continue as planned, progressing as able.    Rehab Potential  Fair    PT Frequency  2x / week    PT Duration  6 weeks    PT Treatment/Interventions  ADLs/Self Care Home Management;Functional mobility training;Therapeutic activities;Therapeutic exercise;Patient/family education;Balance training;Manual techniques;Passive range of motion    PT Next Visit Plan  begin gentle contract relax to improve B hip ER and IR; continue LTRs and add to HEP PRN; continue to improve flexibility and strengthening; add calf stretch (supine or standing, whichever pt can tolerate best)     PT Home Exercise Plan  EVAL:  Hip adduction isometric; supine hip abduction B, clam B     Consulted and Agree with Plan of Care  Patient       Patient will benefit from skilled therapeutic intervention in order to improve the following deficits and impairments:  Abnormal gait, Decreased activity tolerance, Decreased balance, Decreased mobility, Decreased range of motion, Difficulty walking, Increased fascial restricitons, Pain  Visit Diagnosis: Pain in right hip  Pain in left hip  Unsteadiness on feet  History of falling  Difficulty in walking, not elsewhere classified     Problem List Patient Active Problem List   Diagnosis Date Noted  . Gait disturbance, post-stroke 11/02/2017  . Aphasia as late effect of cerebrovascular accident 11/02/2017  . 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 Aphasia   . PAF (paroxysmal atrial fibrillation) ([place])   . Diabetes mellitus type 2 in obese ([place])   . Benign essential HTN   . Hypothyroidism   . Hyperlipidemia   . Stroke (cerebrum) ([place]) 10/02/2017  . Afib ([place]) 01/10/2017  . SOB (shortness of breath) 01/10/2017  . Acute diastolic CHF (congestive heart failure) ([place]) 01/10/2017  . Atrial fibrillation with RVR ([place]) 01/10/2017  .  Claudication ([place]) 07/25/2016  . Paroxysmal atrial fibrillation ([place]) 07/25/2016  . Odynophagia 04/10/2014  . Allergic rhinitis 03/23/2014  . Chronic rhinitis 12/22/2013  . 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 Constipation 08/22/2011  . Obesity 09/20/2009  . G E R D 11/29/2007  . Celiac disease 11/29/2007       Geraldine Solar PT, DPT  [facility] [address] [place], [place], [address] Phone: [phone]   Fax:  [phone]  Name: [name] MRN: [id number] Date of Birth: 09-11-1929

## 2018-01-05 ENCOUNTER — Encounter (HOSPITAL_COMMUNITY): Payer: PPO | Admitting: Speech Pathology

## 2018-01-05 DIAGNOSIS — L821 Other seborrheic keratosis: Secondary | ICD-10-CM | POA: Diagnosis not present

## 2018-01-05 DIAGNOSIS — D229 Melanocytic nevi, unspecified: Secondary | ICD-10-CM | POA: Diagnosis not present

## 2018-01-05 DIAGNOSIS — L570 Actinic keratosis: Secondary | ICD-10-CM | POA: Diagnosis not present

## 2018-01-07 ENCOUNTER — Encounter (HOSPITAL_COMMUNITY): Payer: Self-pay | Admitting: Speech Pathology

## 2018-01-07 ENCOUNTER — Encounter (HOSPITAL_COMMUNITY): Payer: Self-pay

## 2018-01-07 ENCOUNTER — Ambulatory Visit (HOSPITAL_COMMUNITY): Payer: PPO | Admitting: Speech Pathology

## 2018-01-07 ENCOUNTER — Ambulatory Visit (HOSPITAL_COMMUNITY): Payer: PPO

## 2018-01-07 ENCOUNTER — Other Ambulatory Visit: Payer: Self-pay

## 2018-01-07 DIAGNOSIS — Z9181 History of falling: Secondary | ICD-10-CM

## 2018-01-07 DIAGNOSIS — I63512 Cerebral infarction due to unspecified occlusion or stenosis of left middle cerebral artery: Secondary | ICD-10-CM | POA: Diagnosis not present

## 2018-01-07 DIAGNOSIS — M25552 Pain in left hip: Secondary | ICD-10-CM

## 2018-01-07 DIAGNOSIS — R2681 Unsteadiness on feet: Secondary | ICD-10-CM

## 2018-01-07 DIAGNOSIS — M25551 Pain in right hip: Secondary | ICD-10-CM

## 2018-01-07 DIAGNOSIS — R262 Difficulty in walking, not elsewhere classified: Secondary | ICD-10-CM

## 2018-01-07 DIAGNOSIS — R41841 Cognitive communication deficit: Secondary | ICD-10-CM

## 2018-01-07 DIAGNOSIS — R4701 Aphasia: Secondary | ICD-10-CM

## 2018-01-07 NOTE — Therapy (Signed)
[facility] [address] [place], [place], [address] Phone: [phone]   Fax:  [phone]  Physical Therapy Treatment  Patient Details  Name: [name] MRN: [id number] Date of Birth: 28-Nov-1928 Referring Provider: Wende Neighbors    Encounter Date: 01/07/2018  PT End of Session - 01/07/18 1444    Visit Number  3    Number of Visits  12    Date for PT Re-Evaluation  02/09/18 Mini reasses:  01/19/2018    Authorization Type  [employer]    Authorization Time Period  10/21/17 - 02/09/18    PT Start Time  1353    PT Stop Time  1436    PT Time Calculation (min)  43 min    Equipment Utilized During Treatment  Gait belt    Activity Tolerance  Patient tolerated treatment well    Behavior During Therapy  [facility] for tasks assessed/performed       Past Medical History:  Diagnosis Date  . Allergic rhinitis   . Asthmatic bronchitis   . Atrial fibrillation ([place])   . Celiac disease   . Cellulitis   . Diverticulosis   . DM (diabetes mellitus) ([place])   . Fx. left wrist 1987  . Gastric polyps   . GERD (gastroesophageal reflux disease)   . History of pneumonia    w/pleural effusion  . Hypothyroidism   . Iron deficiency anemia   . Melanoma ([place])    both feet  . Neuropathy   . Prostate cancer ([place])   . REM sleep behavior disorder   . RLS (restless legs syndrome)   . Sleep apnea   . Stroke [facility]) 09/2017    Past Surgical History:  Procedure Laterality Date  . APPENDECTOMY  1978  . CARPAL TUNNEL RELEASE Left   . CATARACT EXTRACTION  03/2011   bilateral   . CHOLECYSTECTOMY  2001  . COLONOSCOPY W/ BIOPSIES  04/27/2008   diverticulosis, duodenitis  . FOOT SURGERY    . HERNIA REPAIR  1994   bilateral  . KNEE SURGERY Bilateral    x 2  . LUNG SURGERY  2001   for infection  . PROSTATECTOMY  2002  . UPPER GASTROINTESTINAL ENDOSCOPY  04/27/2008   celiac disease, gastric polyps    There were no vitals filed for this  visit.  Subjective Assessment - 01/07/18 1443    Subjective  Pt does not have any rt hip to speak of but did have pain upon waking this am.     Pertinent History  CVA, OA     Pain Score  0-No pain                No data recorded       [facility] Adult PT Treatment/Exercise - 01/07/18 0001      Knee/Hip Exercises: Stretches   Passive Hamstring Stretch  Both;2 reps;30 seconds;Limitations    Passive Hamstring Stretch Limitations  supine with rope    Piriformis Stretch  Both;2 reps;30 seconds    Gastroc Stretch  Both;2 reps;30 seconds slantboard      Knee/Hip Exercises: Standing   Hip Abduction  Stengthening;Both;2 sets;10 reps    Hip Extension  Stengthening;Both;2 sets;10 reps      Knee/Hip Exercises: Seated   Marching  Both;10 reps      Knee/Hip Exercises: Supine   Bridges  Both;2 sets;10 reps      Knee/Hip Exercises: Sidelying   Clams  2x10  PT Education - 01/07/18 1444    Education provided  Yes    Education Details  exercise technique    Person(s) Educated  Patient    Methods  Explanation    Comprehension  Verbalized understanding       PT Short Term Goals - 12/29/17 1633      PT SHORT TERM GOAL #1   Title  PT hip B hip ROM to improve 6 degrees or more to ease donning and doffing of socks and shoes.     Time  3    Period  Weeks    Status  New    Target Date  01/19/18      PT SHORT TERM GOAL #2   Title  Pt pain to be decreased in both hips to no greater than a 6/10 to allow pt to be able to walk with his walker for 10 mintues     Time  3    Period  Weeks    Status  New      PT SHORT TERM GOAL #3   Title  PT Strength in B hips to increase one grade to allow pt to be able to come from sit to stand with one UE assist.     Time  3    Period  Weeks    Status  New      PT SHORT TERM GOAL #4   Title  PT to be able to single leg stance for 5 seconds bilaterally for reduce risk of falls.     Time  3    Period  Weeks        PT  Long Term Goals - 12/29/17 1637      PT LONG TERM GOAL #1   Title  PT B hip mobility to be improved to allow pt to lift legs out of the bath with minimal difficulty     Time  6    Period  Weeks    Status  New    Target Date  02/09/18      PT LONG TERM GOAL #2   Title  Pt hip pain  B to be no greater than a 4/10 to allow pt to walk with his walker for 20 mintues.     Time  6    Period  Weeks    Status  New      PT LONG TERM GOAL #3   Title  PT hip strength B to be increased one grade to allow pt to go up and down 4 steps without fear of LE giving way.     Time  6    Period  Weeks      PT LONG TERM GOAL #4   Title  PT to be able to single leg stance on both legs for 10seconds to decrease risk of falling .     Time  6    Period  Weeks    Status  New            Plan - 01/07/18 1447    Clinical Impression Statement  Patient tolerated treatment fairly well. Did complain of rt hip pain with side-lying clams into full AROM. Attempted side-lying hip abduction with verbal and tactile cues but patient was unable to assume correct positioning and motion so performed in standing instead. Patient continues to benefit from skilled PT to reduce pain, increase strength and functional ability and decrease risk of falls.     Rehab Potential  Fair    PT Frequency  2x / week    PT Duration  6 weeks    PT Treatment/Interventions  ADLs/Self Care Home Management;Functional mobility training;Therapeutic activities;Therapeutic exercise;Patient/family education;Balance training;Manual techniques;Passive range of motion    PT Next Visit Plan  begin gentle contract relax to improve B hip ER and IR; continue LTRs and add to HEP PRN; continue to improve flexibility and strengthening; add calf stretch (supine or standing, whichever pt can tolerate best)     PT Home Exercise Plan  EVAL:  Hip adduction isometric; supine hip abduction B, clam B     Consulted and Agree with Plan of Care  Patient        Patient will benefit from skilled therapeutic intervention in order to improve the following deficits and impairments:  Abnormal gait, Decreased activity tolerance, Decreased balance, Decreased mobility, Decreased range of motion, Difficulty walking, Increased fascial restricitons, Pain  Visit Diagnosis: Pain in right hip  Pain in left hip  Unsteadiness on feet  History of falling  Difficulty in walking, not elsewhere classified     Problem List Patient Active Problem List   Diagnosis Date Noted  . Gait disturbance, post-stroke 11/02/2017  . Aphasia as late effect of cerebrovascular accident 11/02/2017  . 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 Aphasia   . PAF (paroxysmal atrial fibrillation) ([place])   . Diabetes mellitus type 2 in obese ([place])   . Benign essential HTN   . Hypothyroidism   . Hyperlipidemia   . Stroke (cerebrum) ([place]) 10/02/2017  . Afib ([place]) 01/10/2017  . SOB (shortness of breath) 01/10/2017  . Acute diastolic CHF (congestive heart failure) ([place]) 01/10/2017  . Atrial fibrillation with RVR ([place]) 01/10/2017  . Claudication ([place]) 07/25/2016  . Paroxysmal atrial fibrillation ([place]) 07/25/2016  . Odynophagia 04/10/2014  . Allergic rhinitis 03/23/2014  . Chronic rhinitis 12/22/2013  . 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 Constipation 08/22/2011  . Obesity 09/20/2009  . G E R D 11/29/2007  . Celiac disease 11/29/2007    Floria Raveling. Hartnett-Rands, MS, PT [facility] #[id number] 01/07/2018, 2:53 PM  [facility] [name] [place], [place], [address] Phone: [phone]   Fax:  [phone]  Name: ASLAN HIMES MRN: [id number] Date of Birth: [birth year]

## 2018-01-07 NOTE — Therapy (Signed)
[place], [place], [address] Phone: [phone]   Fax:  [phone]  Speech Language Pathology Treatment  Patient Details  Name: [name] MRN: [id number] Date of Birth: Feb 15, 1929 Referring Provider: Alysia Penna, MD   Encounter Date: 01/07/2018  End of Session - 01/07/18 1803    Visit Number  17    Number of Visits  17    Date for SLP Re-Evaluation  01/17/18    Authorization Type  [employer]    SLP Start Time  1430    SLP Stop Time   1515    SLP Time Calculation (min)  45 min    Activity Tolerance  Patient tolerated treatment well       Past Medical History:  Diagnosis Date  . Allergic rhinitis   . Asthmatic bronchitis   . Atrial fibrillation ([place])   . Celiac disease   . Cellulitis   . Diverticulosis   . DM (diabetes mellitus) ([place])   . Fx. left wrist 1987  . Gastric polyps   . GERD (gastroesophageal reflux disease)   . History of pneumonia    w/pleural effusion  . Hypothyroidism   . Iron deficiency anemia   . Melanoma ([place])    both feet  . Neuropathy   . Prostate cancer ([place])   . REM sleep behavior disorder   . RLS (restless legs syndrome)   . Sleep apnea   . Stroke [facility]) 09/2017    Past Surgical History:  Procedure Laterality Date  . APPENDECTOMY  1978  . CARPAL TUNNEL RELEASE Left   . CATARACT EXTRACTION  03/2011   bilateral   . CHOLECYSTECTOMY  2001  . COLONOSCOPY W/ BIOPSIES  04/27/2008   diverticulosis, duodenitis  . FOOT SURGERY    . HERNIA REPAIR  1994   bilateral  . KNEE SURGERY Bilateral    x 2  . LUNG SURGERY  2001   for infection  . PROSTATECTOMY  2002  . UPPER GASTROINTESTINAL ENDOSCOPY  04/27/2008   celiac disease, gastric polyps    There were no vitals filed for this visit.  Subjective Assessment - 01/07/18 1752    Subjective  "I will miss coming here."    Currently in Pain?  No/denies       ADULT SLP TREATMENT - 01/07/18 0001       General Information   Behavior/Cognition  Alert;Cooperative;Pleasant mood    Patient Positioning  Upright in chair    Oral care provided  N/A    HPI  Mr. Collie Wernick is an 84 yo male who presented to [facility] with stroke symptoms/aphasia. He was given tPA after head CT and transferred to [facility]. MRI showed acute left MCA territory infarct (temporal lobe). He eventually transferred to inpatient rehab before discharging home on 10/16/2017. He is referred to outpatient for ongoing SLP therapy to address cognitive communication changes with aphasia present. He is accompanied to today's evaluation by his wife, Mervil Wacker. 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 He was driving and handled all finances at home. His wife is currently helping with both at this time. Mr. Hinners hopes to improve his speech and be able to communicate better with people since his stroke.       Treatment Provided   Treatment provided  Cognitive-Linquistic      Pain Assessment   Pain Assessment  No/denies pain  Cognitive-Linquistic Treatment   Treatment focused on  Aphasia;Patient/family/caregiver education    Skilled Treatment  SLP provided skilled treatment targeting expressive language goals in preparation for discharge from services. Pt benefited from additional time to complete complex word finging tasks (Taboo). He was encouraged to provide synonyms for words that could not be used in the activity. Pt with occasional paraphasic errors in conversation, which he independently self corrected.      Assessment / Recommendations / Plan   Plan  Discharge SLP treatment due to (comment)       SLP Education - 01/07/18 1802    Education provided  Yes    Education Details  Review of HEP in preparation for discharge from skilled SLP services    Person(s) Educated  Patient;Spouse    Methods  Explanation;Handout    Comprehension  Verbalized understanding        SLP Short Term Goals - 01/07/18 1808      SLP SHORT TERM GOAL #1   Title  Pt will implement word finding strategies with 90% accuracy when unable to verbalize desired word in conversation/functional tasks with indirect cues. changed to indirect cues    Baseline  75%    Time  4    Period  Weeks    Status  Achieved      SLP SHORT TERM GOAL #2   Title  Pt will complete functional check writing and balancing tasks with 100% acc with min assist and use of compensatory strategies as needed.    Baseline  Pt previously independent and not completing at this time since stroke    Time  4    Period  Weeks    Status  Achieved      SLP SHORT TERM GOAL #3   Title  Pt will self-correct paraphasic errors 90% of the time during conversational speech with min cues.    Baseline  ~75% of the time    Time  4    Period  Weeks    Status  Achieved      SLP SHORT TERM GOAL #4   Title  Pt will complete moderate-level thought organization and planning activities with 90% acc and min assist.    Baseline  mod assist    Time  4    Period  Weeks    Status  Achieved      SLP SHORT TERM GOAL #5   Title  Pt will increase moderately complex divergent naming tasks to 7+ items with min assist and use of strategies as needed.     Baseline  ~5-8 concrete    Time  4    Period  Weeks    Status  Achieved      SLP SHORT TERM GOAL #6   Title  Pt will verbally describe items by salient features (3+) with 100% acc with min assist from SLP.     Baseline  90% with mi/mod cues    Time  4    Period  Weeks    Status  Achieved      SLP SHORT TERM GOAL #7   Title  Pt will orally read paragraph length material with 90% acc with cues for error awareness in order for Pt to self correct.     Baseline  80% acc mi/mod assist    Time  4    Period  Weeks    Status  Achieved       SLP Long Term Goals - 01/07/18 1813  SLP LONG TERM GOAL #1   Title  Pt will express complex wants/needs to [facility] with use of  compensatory strategies as needed.    Baseline  mi/mod impairment    Time  8    Period  Weeks    Status  Achieved       Plan - 01/07/18 1803    Clinical Impression Statement  Mr. Jeansonne met all goals for this treatment period and feels ready for discharge from therapy. Mild expressive language deficits persist, however he is able to use strategies learned in therapy to communicate complex thoughts. Occasional paraphasic errors occurr in conversations, but he attempts to self correct. Pt was given additional home exercises to target expressive language skills and reading comprehension. It is also recommended that Pt continue to read books of interest at home. Pt and caregiver in agreement with discharge from SLP services at this time.     Speech Therapy Frequency  2x / week    Duration  4 weeks    Treatment/Interventions  SLP instruction and feedback;Internal/external aids;Compensatory strategies;Compensatory techniques;Patient/family education;Functional tasks;Cueing hierarchy;Multimodal communcation approach    Potential to Achieve Goals  Good    SLP Home Exercise Plan  Pt will be independent with HEP as assigned to facilitate carryover of treatment strategies and techniques in home environment with assist from caregiver as needed.     Consulted and Agree with Plan of Care  Patient;Family member/caregiver    Family Member Consulted  Spouse, [name]       Patient will benefit from skilled therapeutic intervention in order to improve the following deficits and impairments:   Aphasia  Cognitive communication deficit    Problem List Patient Active Problem List   Diagnosis Date Noted  . Gait disturbance, post-stroke 11/02/2017  . Aphasia as late effect of cerebrovascular accident 11/02/2017  . 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 Aphasia   . PAF (paroxysmal atrial fibrillation) ([place])   . Diabetes mellitus type 2 in obese ([place])   . Benign essential HTN   .  Hypothyroidism   . Hyperlipidemia   . Stroke (cerebrum) ([place]) 10/02/2017  . Afib ([place]) 01/10/2017  . SOB (shortness of breath) 01/10/2017  . Acute diastolic CHF (congestive heart failure) ([place]) 01/10/2017  . Atrial fibrillation with RVR ([place]) 01/10/2017  . Claudication ([place]) 07/25/2016  . Paroxysmal atrial fibrillation ([place]) 07/25/2016  . Odynophagia 04/10/2014  . Allergic rhinitis 03/23/2014  . Chronic rhinitis 12/22/2013  . 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 Constipation 08/22/2011  . Obesity 09/20/2009  . G E R D 11/29/2007  . Celiac disease 11/29/2007  SPEECH THERAPY DISCHARGE SUMMARY  Visits from Start of Care: 17  Current functional level related to goals / functional outcomes: Goals met   Remaining deficits: Mild expressive language deficits; mild reading comprehension deficits   Education / Equipment: Pt has notebook with HEP  Plan: Patient agrees to discharge.  Patient goals were met. Patient is being discharged due to meeting the stated rehab goals.  ?????             Thank you,  Genene Churn, [place]  [facility] 01/07/2018, [place] [address] [place], [place], [address] Phone: [phone]   Fax:  [phone]   Name: SERAFINO BURCIAGA MRN: [id number] Date of Birth: Jul 03, 1929

## 2018-01-12 ENCOUNTER — Ambulatory Visit (HOSPITAL_COMMUNITY): Payer: PPO | Attending: Physical Medicine & Rehabilitation | Admitting: Physical Therapy

## 2018-01-12 ENCOUNTER — Encounter (HOSPITAL_COMMUNITY): Payer: Self-pay | Admitting: Physical Therapy

## 2018-01-12 DIAGNOSIS — M25551 Pain in right hip: Secondary | ICD-10-CM

## 2018-01-12 DIAGNOSIS — Z9181 History of falling: Secondary | ICD-10-CM | POA: Insufficient documentation

## 2018-01-12 DIAGNOSIS — R2681 Unsteadiness on feet: Secondary | ICD-10-CM | POA: Diagnosis not present

## 2018-01-12 DIAGNOSIS — M25552 Pain in left hip: Secondary | ICD-10-CM | POA: Insufficient documentation

## 2018-01-12 DIAGNOSIS — R262 Difficulty in walking, not elsewhere classified: Secondary | ICD-10-CM | POA: Insufficient documentation

## 2018-01-12 NOTE — Therapy (Signed)
[name], [place], [address] Phone: [phone]   Fax:  [phone]  Physical Therapy Treatment  Patient Details  Name: [name] MRN: [id number] Date of Birth: June 07, 1929 Referring Provider: Wende Neighbors    Encounter Date: 01/12/2018  PT End of Session - 01/12/18 1516    Visit Number  4    Number of Visits  12    Date for PT Re-Evaluation  02/09/18 Mini reasses:  01/19/2018    Authorization Type  [employer]    Authorization Time Period  10/21/17 - 02/09/18    PT Start Time  1435    PT Stop Time  1515    PT Time Calculation (min)  40 min    Equipment Utilized During Treatment  Gait belt    Activity Tolerance  Patient tolerated treatment well    Behavior During Therapy  [facility] for tasks assessed/performed       Past Medical History:  Diagnosis Date  . Allergic rhinitis   . Asthmatic bronchitis   . Atrial fibrillation ([place])   . Celiac disease   . Cellulitis   . Diverticulosis   . DM (diabetes mellitus) ([place])   . Fx. left wrist 1987  . Gastric polyps   . GERD (gastroesophageal reflux disease)   . History of pneumonia    w/pleural effusion  . Hypothyroidism   . Iron deficiency anemia   . Melanoma ([place])    both feet  . Neuropathy   . Prostate cancer ([place])   . REM sleep behavior disorder   . RLS (restless legs syndrome)   . Sleep apnea   . Stroke [facility]) 09/2017    Past Surgical History:  Procedure Laterality Date  . APPENDECTOMY  1978  . CARPAL TUNNEL RELEASE Left   . CATARACT EXTRACTION  03/2011   bilateral   . CHOLECYSTECTOMY  2001  . COLONOSCOPY W/ BIOPSIES  04/27/2008   diverticulosis, duodenitis  . FOOT SURGERY    . HERNIA REPAIR  1994   bilateral  . KNEE SURGERY Bilateral    x 2  . LUNG SURGERY  2001   for infection  . PROSTATECTOMY  2002  . UPPER GASTROINTESTINAL ENDOSCOPY  04/27/2008   celiac disease, gastric polyps    There were no vitals filed for this  visit.  Subjective Assessment - 01/12/18 1431    Subjective  PT states that therapy is helping; he is the best he has been for awhile.      Pertinent History  CVA, OA     Limitations  Sitting;Lifting;Standing;Walking    How long can you sit comfortably?  two hours.     How long can you stand comfortably?  Pt has increased pain after 30 minutes if he leans against something .     How long can you walk comfortably?  PT states he has significant increased pain in his legs when he goes to walk.  He is not able to walk any greater than five minutes with his walker .      Patient Stated Goals  to improve whatever he can.       Currently in Pain?  Yes    Pain Score  3     Pain Location  Hip    Pain Orientation  Right    Pain Descriptors / Indicators  Aching    Pain Onset  More than a month ago    Pain Frequency  Intermittent  Aggravating Factors   weight bearing     Effect of Pain on Daily Activities  limit                        [facility] Adult PT Treatment/Exercise - 01/12/18 0001      Exercises   Exercises  Lumbar;Knee/Hip      Lumbar Exercises: Stretches   Standing Side Bend  Right;Left;5 reps    Standing Extension  5 reps      Knee/Hip Exercises: Stretches   Passive Hamstring Stretch  Both;2 reps;30 seconds;Limitations    Piriformis Stretch  Both;2 reps;30 seconds PROM with contract relax to improve motion IR as well     Gastroc Stretch  --    Other Knee/Hip Stretches  hip flexor stretch off table1'      Knee/Hip Exercises: Standing   Hip Abduction  Stengthening;Both;2 sets;10 reps    Hip Extension  Stengthening;Both;2 sets;10 reps      Knee/Hip Exercises: Seated   Marching  --      Knee/Hip Exercises: Supine   Terminal Knee Extension Limitations  Bent knee raise 2x10 reps each    Bridges  Both;2 sets;10 reps    Straight Leg Raises  Strengthening;Both;10 reps;Limitations    Straight Leg Raises Limitations  with therapist manuallykeeping leg in neutral        Knee/Hip Exercises: Sidelying   Hip ABduction  Both;15 reps    Clams  2x10               PT Short Term Goals - 12/29/17 1633      PT SHORT TERM GOAL #1   Title  PT hip B hip ROM to improve 6 degrees or more to ease donning and doffing of socks and shoes.     Time  3    Period  Weeks    Status  New    Target Date  01/19/18      PT SHORT TERM GOAL #2   Title  Pt pain to be decreased in both hips to no greater than a 6/10 to allow pt to be able to walk with his walker for 10 mintues     Time  3    Period  Weeks    Status  New      PT SHORT TERM GOAL #3   Title  PT Strength in B hips to increase one grade to allow pt to be able to come from sit to stand with one UE assist.     Time  3    Period  Weeks    Status  New      PT SHORT TERM GOAL #4   Title  PT to be able to single leg stance for 5 seconds bilaterally for reduce risk of falls.     Time  3    Period  Weeks        PT Long Term Goals - 12/29/17 1637      PT LONG TERM GOAL #1   Title  PT B hip mobility to be improved to allow pt to lift legs out of the bath with minimal difficulty     Time  6    Period  Weeks    Status  New    Target Date  02/09/18      PT LONG TERM GOAL #2   Title  Pt hip pain  B to be no greater than a 4/10 to allow pt to  walk with his walker for 20 mintues.     Time  6    Period  Weeks    Status  New      PT LONG TERM GOAL #3   Title  PT hip strength B to be increased one grade to allow pt to go up and down 4 steps without fear of LE giving way.     Time  6    Period  Weeks      PT LONG TERM GOAL #4   Title  PT to be able to single leg stance on both legs for 10seconds to decrease risk of falling .     Time  6    Period  Weeks    Status  New            Plan - 01/12/18 1517    Clinical Impression Statement  Added standing lumbar extension/SB as well as sidelying hip abduction which was able to be completed with manual blocking for correct technique.  Added contract  relax to IR/ER stretching to improve range as well as hip flexor stretch .     Rehab Potential  Fair    PT Frequency  2x / week    PT Duration  6 weeks    PT Treatment/Interventions  ADLs/Self Care Home Management;Functional mobility training;Therapeutic activities;Therapeutic exercise;Patient/family education;Balance training;Manual techniques;Passive range of motion    PT Next Visit Plan  update HEP  continue to improve flexibility and strengthening; add calf stretch (supine or standing, whichever pt can tolerate best)     PT Home Exercise Plan  EVAL:  Hip adduction isometric; supine hip abduction B, clam B     Consulted and Agree with Plan of Care  Patient       Patient will benefit from skilled therapeutic intervention in order to improve the following deficits and impairments:  Abnormal gait, Decreased activity tolerance, Decreased balance, Decreased mobility, Decreased range of motion, Difficulty walking, Increased fascial restricitons, Pain  Visit Diagnosis: Pain in right hip  Pain in left hip  Unsteadiness on feet  History of falling  Difficulty in walking, not elsewhere classified     Problem List Patient Active Problem List   Diagnosis Date Noted  . Gait disturbance, post-stroke 11/02/2017  . Aphasia as late effect of cerebrovascular accident 11/02/2017  . 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 Aphasia   . PAF (paroxysmal atrial fibrillation) ([place])   . Diabetes mellitus type 2 in obese ([place])   . Benign essential HTN   . Hypothyroidism   . Hyperlipidemia   . Stroke (cerebrum) ([place]) 10/02/2017  . Afib ([place]) 01/10/2017  . SOB (shortness of breath) 01/10/2017  . Acute diastolic CHF (congestive heart failure) ([place]) 01/10/2017  . Atrial fibrillation with RVR ([place]) 01/10/2017  . Claudication ([place]) 07/25/2016  . Paroxysmal atrial fibrillation ([place]) 07/25/2016  . Odynophagia 04/10/2014  . Allergic rhinitis 03/23/2014  . Chronic rhinitis 12/22/2013  .  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 Constipation 08/22/2011  . Obesity 09/20/2009  . G E R D 11/29/2007  . Celiac disease 11/29/2007   Rayetta Humphrey, PT CLT [phone] 01/12/2018, 3:21 PM  [place] [address] [place], [place], [address] Phone: [phone]   Fax:  [phone]  Name: [name] MRN: [id number] Date of Birth: 1929-01-29

## 2018-01-14 ENCOUNTER — Ambulatory Visit (HOSPITAL_COMMUNITY): Payer: PPO

## 2018-01-14 ENCOUNTER — Encounter (HOSPITAL_COMMUNITY): Payer: Self-pay

## 2018-01-14 DIAGNOSIS — R2681 Unsteadiness on feet: Secondary | ICD-10-CM

## 2018-01-14 DIAGNOSIS — M25551 Pain in right hip: Secondary | ICD-10-CM | POA: Diagnosis not present

## 2018-01-14 DIAGNOSIS — R262 Difficulty in walking, not elsewhere classified: Secondary | ICD-10-CM

## 2018-01-14 DIAGNOSIS — Z9181 History of falling: Secondary | ICD-10-CM

## 2018-01-14 DIAGNOSIS — M25552 Pain in left hip: Secondary | ICD-10-CM

## 2018-01-14 NOTE — Therapy (Signed)
[place], [place], [address] Phone: [phone]   Fax:  [phone]  Physical Therapy Treatment  Patient Details  Name: [name] MRN: [id number] Date of Birth: 06/13/1929 Referring Provider: Wende Neighbors    Encounter Date: 01/14/2018  PT End of Session - 01/14/18 1405    Visit Number  5    Number of Visits  12    Date for PT Re-Evaluation  02/09/18 Minireassess 01/19/18    Authorization Type  [employer]    Authorization Time Period  10/21/17 - 02/09/18    PT Start Time  1348    PT Stop Time  1432    PT Time Calculation (min)  44 min    Equipment Utilized During Treatment  Gait belt    Activity Tolerance  Patient tolerated treatment well    Behavior During Therapy  [facility] for tasks assessed/performed       Past Medical History:  Diagnosis Date  . Allergic rhinitis   . Asthmatic bronchitis   . Atrial fibrillation ([place])   . Celiac disease   . Cellulitis   . Diverticulosis   . DM (diabetes mellitus) ([place])   . Fx. left wrist 1987  . Gastric polyps   . GERD (gastroesophageal reflux disease)   . History of pneumonia    w/pleural effusion  . Hypothyroidism   . Iron deficiency anemia   . Melanoma ([place])    both feet  . Neuropathy   . Prostate cancer ([place])   . REM sleep behavior disorder   . RLS (restless legs syndrome)   . Sleep apnea   . Stroke [facility]) 09/2017    Past Surgical History:  Procedure Laterality Date  . APPENDECTOMY  1978  . CARPAL TUNNEL RELEASE Left   . CATARACT EXTRACTION  03/2011   bilateral   . CHOLECYSTECTOMY  2001  . COLONOSCOPY W/ BIOPSIES  04/27/2008   diverticulosis, duodenitis  . FOOT SURGERY    . HERNIA REPAIR  1994   bilateral  . KNEE SURGERY Bilateral    x 2  . LUNG SURGERY  2001   for infection  . PROSTATECTOMY  2002  . UPPER GASTROINTESTINAL ENDOSCOPY  04/27/2008   celiac disease, gastric polyps    There were no vitals filed for this visit.  Subjective  Assessment - 01/14/18 1357    Subjective  Pt stated he is feeling good today.    Patient Stated Goals  to improve whatever he can.       Currently in Pain?  Yes    Pain Score  3     Pain Location  Knee    Pain Orientation  Left    Pain Descriptors / Indicators  Sore    Pain Type  Chronic pain    Pain Onset  More than a month ago    Pain Frequency  Intermittent    Aggravating Factors   weight bearing    Pain Relieving Factors  rest; heat                       [facility] Adult PT Treatment/Exercise - 01/14/18 0001      Lumbar Exercises: Stretches   Lower Trunk Rotation  5 reps;10 seconds    Standing Side Bend  Right;Left;5 reps    Standing Extension  5 reps    Gastroc Stretch  2 reps;30 seconds    Other Lumbar Stretch Exercise  standing hip rotation 5x  5" each direction      Knee/Hip Exercises: Stretches   Passive Hamstring Stretch  Both;2 reps;30 seconds;Limitations    Passive Hamstring Stretch Limitations  supine with rope    Piriformis Stretch  Both;2 reps;30 seconds    Other Knee/Hip Stretches  LTRs for hip IR 5x5" holds each    Other Knee/Hip Stretches  hip flexor stretch off table1'      Knee/Hip Exercises: Standing   Hip Abduction  Stengthening;Both;2 sets;10 reps    Hip Extension  Stengthening;Both;2 sets;10 reps      Knee/Hip Exercises: Supine   Terminal Knee Extension Limitations  Bent knee raise 2x10 reps each    Bridges with Ball Squeeze  Both;2 sets;10 reps ball between knees    Straight Leg Raises  Strengthening;Both;10 reps;Limitations    Straight Leg Raises Limitations  with therapist manuallykeeping leg in neutral       Knee/Hip Exercises: Sidelying   Hip ABduction  Both;15 reps               PT Short Term Goals - 12/29/17 1633      PT SHORT TERM GOAL #1   Title  PT hip B hip ROM to improve 6 degrees or more to ease donning and doffing of socks and shoes.     Time  3    Period  Weeks    Status  New    Target Date  01/19/18       PT SHORT TERM GOAL #2   Title  Pt pain to be decreased in both hips to no greater than a 6/10 to allow pt to be able to walk with his walker for 10 mintues     Time  3    Period  Weeks    Status  New      PT SHORT TERM GOAL #3   Title  PT Strength in B hips to increase one grade to allow pt to be able to come from sit to stand with one UE assist.     Time  3    Period  Weeks    Status  New      PT SHORT TERM GOAL #4   Title  PT to be able to single leg stance for 5 seconds bilaterally for reduce risk of falls.     Time  3    Period  Weeks        PT Long Term Goals - 12/29/17 1637      PT LONG TERM GOAL #1   Title  PT B hip mobility to be improved to allow pt to lift legs out of the bath with minimal difficulty     Time  6    Period  Weeks    Status  New    Target Date  02/09/18      PT LONG TERM GOAL #2   Title  Pt hip pain  B to be no greater than a 4/10 to allow pt to walk with his walker for 20 mintues.     Time  6    Period  Weeks    Status  New      PT LONG TERM GOAL #3   Title  PT hip strength B to be increased one grade to allow pt to go up and down 4 steps without fear of LE giving way.     Time  6    Period  Weeks      PT LONG TERM  GOAL #4   Title  PT to be able to single leg stance on both legs for 10seconds to decrease risk of falling .     Time  6    Period  Weeks    Status  New            Plan - 01/14/18 1536    Clinical Impression Statement  Continued session focus with hip mobiltiy and gluteal strengthening.  Added supine LTR, standing hip rotation and supine gastroc stretches to improve mobiltiy.  Noted improvements following contract relax to improve IR BLE.  No reoprts of increased pain at EOS.      Rehab Potential  Fair    PT Frequency  2x / week    PT Duration  6 weeks    PT Treatment/Interventions  ADLs/Self Care Home Management;Functional mobility training;Therapeutic activities;Therapeutic exercise;Patient/family education;Balance  training;Manual techniques;Passive range of motion    PT Next Visit Plan  update HEP  continue to improve flexibility and strengthening    PT Home Exercise Plan  EVAL:  Hip adduction isometric; supine hip abduction B, clam B        Patient will benefit from skilled therapeutic intervention in order to improve the following deficits and impairments:  Abnormal gait, Decreased activity tolerance, Decreased balance, Decreased mobility, Decreased range of motion, Difficulty walking, Increased fascial restricitons, Pain  Visit Diagnosis: Pain in right hip  Pain in left hip  Unsteadiness on feet  History of falling  Difficulty in walking, not elsewhere classified     Problem List Patient Active Problem List   Diagnosis Date Noted  . Gait disturbance, post-stroke 11/02/2017  . Aphasia as late effect of cerebrovascular accident 11/02/2017  . 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 Aphasia   . PAF (paroxysmal atrial fibrillation) ([place])   . Diabetes mellitus type 2 in obese ([place])   . Benign essential HTN   . Hypothyroidism   . Hyperlipidemia   . Stroke (cerebrum) ([place]) 10/02/2017  . Afib ([place]) 01/10/2017  . SOB (shortness of breath) 01/10/2017  . Acute diastolic CHF (congestive heart failure) ([place]) 01/10/2017  . Atrial fibrillation with RVR ([place]) 01/10/2017  . Claudication ([place]) 07/25/2016  . Paroxysmal atrial fibrillation ([place]) 07/25/2016  . Odynophagia 04/10/2014  . Allergic rhinitis 03/23/2014  . Chronic rhinitis 12/22/2013  . 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 Constipation 08/22/2011  . Obesity 09/20/2009  . G E R D 11/29/2007  . Celiac disease 11/29/2007   Ihor Austin, [place]; [place]  Aldona Lento 01/14/2018, 3:47 PM  [facility] [address] [place], [place], [address] Phone: [phone]   Fax:  [phone]  Name: [name] MRN: [id number] Date of Birth: 19-May-1929

## 2018-01-15 ENCOUNTER — Other Ambulatory Visit: Payer: Self-pay

## 2018-01-15 NOTE — Patient Outreach (Signed)
Telephone outreach to patient to obtain mRS was successfully completed. mRS = 3

## 2018-01-19 ENCOUNTER — Encounter (HOSPITAL_COMMUNITY): Payer: Self-pay | Admitting: Physical Therapy

## 2018-01-19 ENCOUNTER — Ambulatory Visit (HOSPITAL_COMMUNITY): Payer: PPO | Admitting: Physical Therapy

## 2018-01-19 DIAGNOSIS — M25551 Pain in right hip: Secondary | ICD-10-CM

## 2018-01-19 DIAGNOSIS — R262 Difficulty in walking, not elsewhere classified: Secondary | ICD-10-CM

## 2018-01-19 DIAGNOSIS — R2681 Unsteadiness on feet: Secondary | ICD-10-CM

## 2018-01-19 DIAGNOSIS — Z9181 History of falling: Secondary | ICD-10-CM

## 2018-01-19 DIAGNOSIS — M25552 Pain in left hip: Secondary | ICD-10-CM

## 2018-01-19 NOTE — Therapy (Signed)
[place], [place], [address] Phone: [phone]   Fax:  [phone]  Physical Therapy Treatment/Reassessment  Patient Details  Name: [name] MRN: [id number] Date of Birth: [birth year] Referring Provider: Wende Neighbors   Encounter Date: 01/19/2018   Progress Note Reporting Period 3/19//2019 to 01/19/2018  See note below for Objective Data and Assessment of Progress/Goals.       PT End of Session - 01/19/18 1628    Visit Number  6    Number of Visits  12    Date for PT Re-Evaluation  02/09/18 Minireassess 01/19/18    Authorization Type  [employer]    Authorization Time Period  10/21/17 - 02/09/18    PT Start Time  1430    PT Stop Time  1515    PT Time Calculation (min)  45 min    Equipment Utilized During Treatment  Gait belt    Activity Tolerance  Patient tolerated treatment well    Behavior During Therapy  WFL for tasks assessed/performed       Past Medical History:  Diagnosis Date  . Allergic rhinitis   . Asthmatic bronchitis   . Atrial fibrillation ([place])   . Celiac disease   . Cellulitis   . Diverticulosis   . DM (diabetes mellitus) ([place])   . Fx. left wrist 1987  . Gastric polyps   . GERD (gastroesophageal reflux disease)   . History of pneumonia    w/pleural effusion  . Hypothyroidism   . Iron deficiency anemia   . Melanoma ([place])    both feet  . Neuropathy   . Prostate cancer ([place])   . REM sleep behavior disorder   . RLS (restless legs syndrome)   . Sleep apnea   . Stroke [facility]) 09/2017    Past Surgical History:  Procedure Laterality Date  . APPENDECTOMY  1978  . CARPAL TUNNEL RELEASE Left   . CATARACT EXTRACTION  03/2011   bilateral   . CHOLECYSTECTOMY  2001  . COLONOSCOPY W/ BIOPSIES  04/27/2008   diverticulosis, duodenitis  . FOOT SURGERY    . HERNIA REPAIR  1994   bilateral  . KNEE SURGERY Bilateral    x 2  . LUNG SURGERY  2001   for infection  . PROSTATECTOMY   2002  . UPPER GASTROINTESTINAL ENDOSCOPY  04/27/2008   celiac disease, gastric polyps    There were no vitals filed for this visit.      [facility] PT Assessment - 01/19/18 0001      Assessment   Medical Diagnosis  B hip pain with radiation     Referring Provider  Zack Hall    Onset Date/Surgical Date  11/30/17    Prior Therapy  not for this diagnosis for CVA       Precautions   Precautions  Fall      Restrictions   Weight Bearing Restrictions  No      Balance Screen   Has the patient fallen in the past 6 months  Yes    How many times?  6 no falls since therapy started     Has the patient had a decrease in activity level because of a fear of falling?   Yes    Is the patient reluctant to leave their home because of a fear of falling?   Yes      [place]  Spouse/significant other    Available Help at Discharge  Family    Type of [place] to enter    Entrance Stairs-Number of Steps  -- [place]  One level      Prior Function   Level of Independence  Independent      Cognition   Overall Cognitive Status  Within Functional Limits for tasks assessed      Observation/Other Assessments   Focus on Therapeutic Outcomes (FOTO)   40 was 36.      Functional Tests   Functional tests  Sit to Stand      Single Leg Stance   Comments  Rt 7 seconds left 1;  unable at initial eval      Sit to Stand   Comments  Takes several attempts but able to with significant difficulty  at eval pt needed B UE        Strength   Right Hip Flexion  4+/5 was a 4-    Right Hip Extension  3-/5 was 3-    Right Hip External Rotation   4/5 was 4-    Right Hip Internal Rotation  3/5 was 3    Right Hip ABduction  3+/5 was 2/5    Left Hip Flexion  4+/5 was 2+    Left Hip Extension  3-/5    Left Hip External Rotation  4/5 was 4    Left Hip Internal Rotation  2+/5 was 2+    Left Hip ABduction  3+/5  was 2/5     Right Knee Extension  5/5    Left Knee Extension  5/5    Right Ankle Dorsiflexion  2/5 was a 2-    Left Ankle Dorsiflexion  2-/5 was 1/5       Flexibility   Soft Tissue Assessment /Muscle Length  yes    Hamstrings  Rt 155 was145; Lt 142 was 145    Piriformis  RT:60 was  45; LT 50 was 45    Obturator Internus  IR of hip RT :3 was0 ; LT 3 was -8                   [facility] Adult PT Treatment/Exercise - 01/19/18 0001      Exercises   Exercises  Knee/Hip      Knee/Hip Exercises: Stretches   Passive Hamstring Stretch  Both;2 reps;30 seconds;Limitations    Piriformis Stretch  Both;2 reps;30 seconds    Other Knee/Hip Stretches  LTRs for hip IR 5x5" holds each    Other Knee/Hip Stretches  hip flexor stretch off table1'      Knee/Hip Exercises: Standing   SLS  x5 each      Knee/Hip Exercises: Seated   Sit to Sand  10 reps;without UE support;Other (comment) elevated mat       Knee/Hip Exercises: Supine   Bridges  10 reps;Limitations    Bridges Limitations  holding for 10"      Knee/Hip Exercises: Sidelying   Other Sidelying Knee/Hip Exercises  resisted hip extension B x 10 each                PT Short Term Goals - 01/19/18 1506      PT SHORT TERM GOAL #1   Title  PT hip B hip ROM to improve 6 degrees or more to ease donning and doffing of socks and shoes.     Time  3    Period  Weeks    Status  Achieved      PT SHORT TERM GOAL #2   Title  Pt pain to be decreased in both hips to no greater than a 6/10 to allow pt to be able to walk with his walker for 10 mintues     Baseline  pain is decreased pt has not tried walking     Time  3    Period  Weeks    Status  Partially Met      PT SHORT TERM GOAL #3   Title  PT Strength in B hips to increase one grade to allow pt to be able to come from sit to stand with one UE assist.     Time  3    Period  Weeks    Status  On-going      PT SHORT TERM GOAL #4   Title  PT to be able to single leg stance for  5 seconds bilaterally for reduce risk of falls.     Time  3    Period  Weeks    Status  Partially Met        PT Long Term Goals - 01/19/18 1508      PT LONG TERM GOAL #1   Title  PT B hip mobility to be improved to allow pt to lift legs out of the bath with minimal difficulty     Time  6    Period  Weeks    Status  Achieved      PT LONG TERM GOAL #2   Title  Pt hip pain  B to be no greater than a 4/10 to allow pt to walk with his walker for 20 mintues.     Time  6    Period  Weeks    Status  On-going      PT LONG TERM GOAL #3   Title  PT hip strength B to be increased one grade to allow pt to go up and down 4 steps without fear of LE giving way.     Time  6    Period  Weeks    Status  On-going      PT LONG TERM GOAL #4   Title  PT to be able to single leg stance on both legs for 10seconds to decrease risk of falling .     Time  6    Period  Weeks    Status  Not Met            Plan - 01/19/18 1629    Clinical Impression Statement  PT reassessed this treatment with noted improvement in almost all aspects.  Mr. Debarr continues to have limitation in hip ROM, balance and LE strength especially B gluteal maximus which is decreasing his functional ability.  Mr. Quebedeaux will continue to benefit from skilled physical therapy to address his deficits to maximize his functional ability.  Please see goals above to see current progress.      Rehab Potential  Fair    PT Frequency  2x / week    PT Duration  6 weeks    PT Treatment/Interventions  ADLs/Self Care Home Management;Functional mobility training;Therapeutic activities;Therapeutic exercise;Patient/family education;Balance training;Manual techniques;Passive range of motion    PT Next Visit Plan  Please give bridging holding for 10 seconds, side lying abduction , single leg stance and heel raising for pt new HEP     PT Home  Exercise Plan  EVAL:  Hip adduction isometric; supine hip abduction B, clam B        Patient will  benefit from skilled therapeutic intervention in order to improve the following deficits and impairments:  Abnormal gait, Decreased activity tolerance, Decreased balance, Decreased mobility, Decreased range of motion, Difficulty walking, Increased fascial restricitons, Pain  Visit Diagnosis: Pain in right hip  Pain in left hip  Unsteadiness on feet  History of falling  Difficulty in walking, not elsewhere classified     Problem List Patient Active Problem List   Diagnosis Date Noted  . Gait disturbance, post-stroke 11/02/2017  . Aphasia as late effect of cerebrovascular accident 11/02/2017  . 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 Aphasia   . PAF (paroxysmal atrial fibrillation) ([place])   . Diabetes mellitus type 2 in obese ([place])   . Benign essential HTN   . Hypothyroidism   . Hyperlipidemia   . Stroke (cerebrum) ([place]) 10/02/2017  . Afib ([place]) 01/10/2017  . SOB (shortness of breath) 01/10/2017  . Acute diastolic CHF (congestive heart failure) ([place]) 01/10/2017  . Atrial fibrillation with RVR ([place]) 01/10/2017  . Claudication ([place]) 07/25/2016  . Paroxysmal atrial fibrillation ([place]) 07/25/2016  . Odynophagia 04/10/2014  . Allergic rhinitis 03/23/2014  . Chronic rhinitis 12/22/2013  . 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 Constipation 08/22/2011  . Obesity 09/20/2009  . G E R D 11/29/2007  . Celiac disease 11/29/2007    Rayetta Humphrey, PT CLT [phone] 01/19/2018, 4:36 PM  [facility] [address] [place], [place], [address] Phone: [phone]   Fax:  [phone]  Name: WINFERD WEASE MRN: [id number] Date of Birth: [birth year]

## 2018-01-20 DIAGNOSIS — E119 Type 2 diabetes mellitus without complications: Secondary | ICD-10-CM | POA: Diagnosis not present

## 2018-01-21 ENCOUNTER — Ambulatory Visit (HOSPITAL_COMMUNITY): Payer: PPO

## 2018-01-21 ENCOUNTER — Encounter (HOSPITAL_COMMUNITY): Payer: Self-pay

## 2018-01-21 DIAGNOSIS — M25551 Pain in right hip: Secondary | ICD-10-CM

## 2018-01-21 DIAGNOSIS — R2681 Unsteadiness on feet: Secondary | ICD-10-CM

## 2018-01-21 DIAGNOSIS — Z9181 History of falling: Secondary | ICD-10-CM

## 2018-01-21 DIAGNOSIS — R262 Difficulty in walking, not elsewhere classified: Secondary | ICD-10-CM

## 2018-01-21 DIAGNOSIS — M25552 Pain in left hip: Secondary | ICD-10-CM

## 2018-01-21 NOTE — Patient Instructions (Signed)
Bridging    Slowly raise buttocks from floor, keeping stomach tight and hold for 10 seconds. Repeat 10 times per set. Do 2 sets per session.  http://orth.exer.us/1096   Copyright  VHI. All rights reserved.  Abduction: Side Leg Lift (Eccentric) - Side-Lying    Lie on side. Lift top leg slightly higher than shoulder level. Keep top leg straight with body, toes pointing forward. Slowly lower for 3-5 seconds. 10 reps per set, 2 sets per day, 4 days per week. http://ecce.exer.us/62   Copyright  VHI. All rights reserved.   SINGLE LIMB STANCE    Stance: single leg on floor. Raise leg. Hold 10 seconds. Repeat with other leg. 5 reps per set, 2 sets per day, 6 days per week  Copyright  VHI. All rights reserved.   Heel Raise (Calf Strength / Balance)    Stand with support.. Breathe in. Rise up on tiptoes, breathing out through pursed lips. Hold position to count of 3". Return slowly, breathing in. Repeat 10 times per session. Do 2 sessions per day.  Copyright  VHI. All rights reserved.

## 2018-01-21 NOTE — Therapy (Signed)
[place], [place], [address] Phone: [phone]   Fax:  [phone]  Physical Therapy Treatment  Patient Details  Name: [name] MRN: [id number] Date of Birth: [birth year] Referring Provider: Wende Neighbors   Encounter Date: 01/21/2018  PT End of Session - 01/21/18 1517    Visit Number  7    Number of Visits  12    Date for PT Re-Evaluation  02/09/18 minireassess 01/19/18    Authorization Type  [employer]    Authorization Time Period  10/21/17 - 02/09/18    PT Start Time  1437    PT Stop Time  1517    PT Time Calculation (min)  40 min    Equipment Utilized During Treatment  Gait belt    Activity Tolerance  Patient tolerated treatment well    Behavior During Therapy  [facility] for tasks assessed/performed       Past Medical History:  Diagnosis Date  . Allergic rhinitis   . Asthmatic bronchitis   . Atrial fibrillation ([place])   . Celiac disease   . Cellulitis   . Diverticulosis   . DM (diabetes mellitus) ([place])   . Fx. left wrist 1987  . Gastric polyps   . GERD (gastroesophageal reflux disease)   . History of pneumonia    w/pleural effusion  . Hypothyroidism   . Iron deficiency anemia   . Melanoma ([employer])    both feet  . Neuropathy   . Prostate cancer ([place])   . REM sleep behavior disorder   . RLS (restless legs syndrome)   . Sleep apnea   . Stroke [facility]) 09/2017    Past Surgical History:  Procedure Laterality Date  . APPENDECTOMY  1978  . CARPAL TUNNEL RELEASE Left   . CATARACT EXTRACTION  03/2011   bilateral   . CHOLECYSTECTOMY  2001  . COLONOSCOPY W/ BIOPSIES  04/27/2008   diverticulosis, duodenitis  . FOOT SURGERY    . HERNIA REPAIR  1994   bilateral  . KNEE SURGERY Bilateral    x 2  . LUNG SURGERY  2001   for infection  . PROSTATECTOMY  2002  . UPPER GASTROINTESTINAL ENDOSCOPY  04/27/2008   celiac disease, gastric polyps    There were no vitals filed for this visit.  Subjective  Assessment - 01/21/18 1438    Subjective  Pt stated he is feeling good today, minimal pain mainly in Lt knee.    Patient Stated Goals  to improve whatever he can.       Currently in Pain?  Yes    Pain Score  3     Pain Location  Knee    Pain Orientation  Left    Pain Descriptors / Indicators  Aching    Pain Type  Chronic pain    Pain Onset  More than a month ago    Pain Frequency  Constant    Aggravating Factors   weight bearing     Pain Relieving Factors  rest, heat         [facility] Adult PT Treatment/Exercise - 01/21/18 0001      Knee/Hip Exercises: Stretches   Piriformis Stretch  Both;2 reps;30 seconds    Piriformis Stretch Limitations  PROM for IR as well     Other Knee/Hip Stretches  LTRs for hip IR 10x 10" holds each    Other Knee/Hip Stretches  hip flexor stretch off table1' x2      Knee/Hip  Exercises: Standing   Heel Raises  10 reps    Heel Raises Limitations  Toe raises; lacking full range    SLS  5x 10" holds wiht 2 finger tips      Knee/Hip Exercises: Supine   Bridges Limitations  holding for 10"    Bridges with [employer]  Both;2 sets;10 reps feet wide (manual aide) with ball between knees      Knee/Hip Exercises: Sidelying   Hip ABduction  Both;15 reps    Other Sidelying Knee/Hip Exercises  resisted hip extension B x 10 each       Manual Therapy   Manual Therapy  Passive ROM    Manual therapy comments  Manual complete separate than rest of tx    Passive ROM  Bil hip IR/ER and prolonged hip flexor stretch               PT Short Term Goals - 01/19/18 1506      PT SHORT TERM GOAL #1   Title  PT hip B hip ROM to improve 6 degrees or more to ease donning and doffing of socks and shoes.     Time  3    Period  Weeks    Status  Achieved      PT SHORT TERM GOAL #2   Title  Pt pain to be decreased in both hips to no greater than a 6/10 to allow pt to be able to walk with his walker for 10 mintues     Baseline  pain is decreased pt has not tried walking      Time  3    Period  Weeks    Status  Partially Met      PT SHORT TERM GOAL #3   Title  PT Strength in B hips to increase one grade to allow pt to be able to come from sit to stand with one UE assist.     Time  3    Period  Weeks    Status  On-going      PT SHORT TERM GOAL #4   Title  PT to be able to single leg stance for 5 seconds bilaterally for reduce risk of falls.     Time  3    Period  Weeks    Status  Partially Met        PT Long Term Goals - 01/19/18 1508      PT LONG TERM GOAL #1   Title  PT B hip mobility to be improved to allow pt to lift legs out of the bath with minimal difficulty     Time  6    Period  Weeks    Status  Achieved      PT LONG TERM GOAL #2   Title  Pt hip pain  B to be no greater than a 4/10 to allow pt to walk with his walker for 20 mintues.     Time  6    Period  Weeks    Status  On-going      PT LONG TERM GOAL #3   Title  PT hip strength B to be increased one grade to allow pt to go up and down 4 steps without fear of LE giving way.     Time  6    Period  Weeks    Status  On-going      PT LONG TERM GOAL #4   Title  PT to be able  to single leg stance on both legs for 10seconds to decrease risk of falling .     Time  6    Period  Weeks    Status  Not Met            Plan - 01/21/18 1556    Clinical Impression Statement  Updated HEP for hip strengthening and balance training, pt able to demonstrate and verbalize appropriate mechanics with all exercises.  Continues session focus with proximal strengthening and hip mobiltiy to improve gait mechanics.  Min verbal and tactile cueing for proper form and correct muscle activation with activities this session.  Pt reports no pain at EOS.      Rehab Potential  Fair    PT Frequency  2x / week    PT Duration  6 weeks    PT Treatment/Interventions  ADLs/Self Care Home Management;Functional mobility training;Therapeutic activities;Therapeutic exercise;Patient/family education;Balance  training;Manual techniques;Passive range of motion    PT Next Visit Plan  Continue hip mobility, gluteal strengthening and balance training.      PT Home Exercise Plan  EVAL:  Hip adduction isometric; supine hip abduction B, clam B; 4/11: bridge 10" holds, S/L abduction, SLS and heel raises       Patient will benefit from skilled therapeutic intervention in order to improve the following deficits and impairments:  Abnormal gait, Decreased activity tolerance, Decreased balance, Decreased mobility, Decreased range of motion, Difficulty walking, Increased fascial restricitons, Pain  Visit Diagnosis: Pain in right hip  Pain in left hip  Unsteadiness on feet  History of falling  Difficulty in walking, not elsewhere classified     Problem List Patient Active Problem List   Diagnosis Date Noted  . Gait disturbance, post-stroke 11/02/2017  . Aphasia as late effect of cerebrovascular accident 11/02/2017  . 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 Aphasia   . PAF (paroxysmal atrial fibrillation) ([place])   . Diabetes mellitus type 2 in obese ([place])   . Benign essential HTN   . Hypothyroidism   . Hyperlipidemia   . Stroke (cerebrum) ([place]) 10/02/2017  . Afib ([place]) 01/10/2017  . SOB (shortness of breath) 01/10/2017  . Acute diastolic CHF (congestive heart failure) ([place]) 01/10/2017  . Atrial fibrillation with RVR ([place]) 01/10/2017  . Claudication ([place]) 07/25/2016  . Paroxysmal atrial fibrillation ([place]) 07/25/2016  . Odynophagia 04/10/2014  . Allergic rhinitis 03/23/2014  . Chronic rhinitis 12/22/2013  . 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 Constipation 08/22/2011  . Obesity 09/20/2009  . G E R D 11/29/2007  . Celiac disease 11/29/2007   Ihor Austin, [place]; [place]  Aldona Lento 01/21/2018, 4:04 PM  [place], [place], [address] Phone: [phone]   Fax:  [phone]  Name: STOY FENN MRN: [id number] Date of Birth: 1929/05/27

## 2018-01-25 ENCOUNTER — Other Ambulatory Visit: Payer: Self-pay | Admitting: Physical Medicine & Rehabilitation

## 2018-01-26 ENCOUNTER — Encounter (HOSPITAL_COMMUNITY): Payer: Self-pay

## 2018-01-26 ENCOUNTER — Ambulatory Visit (HOSPITAL_COMMUNITY): Payer: PPO

## 2018-01-26 DIAGNOSIS — M25551 Pain in right hip: Secondary | ICD-10-CM

## 2018-01-26 DIAGNOSIS — R262 Difficulty in walking, not elsewhere classified: Secondary | ICD-10-CM

## 2018-01-26 DIAGNOSIS — M25552 Pain in left hip: Secondary | ICD-10-CM

## 2018-01-26 DIAGNOSIS — Z9181 History of falling: Secondary | ICD-10-CM

## 2018-01-26 DIAGNOSIS — R2681 Unsteadiness on feet: Secondary | ICD-10-CM

## 2018-01-26 NOTE — Therapy (Signed)
[place], [place], [address] Phone: [phone]   Fax:  [phone]  Physical Therapy Treatment  Patient Details  Name: [name] MRN: [id number] Date of Birth: 1929/07/06 Referring Provider: Wende Neighbors   Encounter Date: 01/26/2018  PT End of Session - 01/26/18 1445    Visit Number  8    Number of Visits  12    Date for PT Re-Evaluation  02/09/18 minireassess 01/19/18    Authorization Type  [employer]    Authorization Time Period  10/21/17 - 02/09/18    PT Start Time  1437    PT Stop Time  1515    PT Time Calculation (min)  38 min    Equipment Utilized During Treatment  Gait belt    Activity Tolerance  Patient tolerated treatment well    Behavior During Therapy  [facility] for tasks assessed/performed       Past Medical History:  Diagnosis Date  . Allergic rhinitis   . Asthmatic bronchitis   . Atrial fibrillation ([place])   . Celiac disease   . Cellulitis   . Diverticulosis   . DM (diabetes mellitus) ([place])   . Fx. left wrist 1987  . Gastric polyps   . GERD (gastroesophageal reflux disease)   . History of pneumonia    w/pleural effusion  . Hypothyroidism   . Iron deficiency anemia   . Melanoma ([place])    both feet  . Neuropathy   . Prostate cancer ([place])   . REM sleep behavior disorder   . RLS (restless legs syndrome)   . Sleep apnea   . Stroke [facility]) 09/2017    Past Surgical History:  Procedure Laterality Date  . APPENDECTOMY  1978  . CARPAL TUNNEL RELEASE Left   . CATARACT EXTRACTION  03/2011   bilateral   . CHOLECYSTECTOMY  2001  . COLONOSCOPY W/ BIOPSIES  04/27/2008   diverticulosis, duodenitis  . FOOT SURGERY    . HERNIA REPAIR  1994   bilateral  . KNEE SURGERY Bilateral    x 2  . LUNG SURGERY  2001   for infection  . PROSTATECTOMY  2002  . UPPER GASTROINTESTINAL ENDOSCOPY  04/27/2008   celiac disease, gastric polyps    There were no vitals filed for this visit.  Subjective  Assessment - 01/26/18 1440    Subjective  Pt stated he is feeling good today, continues to have some pain Lt knee    Pertinent History  CVA, OA     Patient Stated Goals  to improve whatever he can.       Currently in Pain?  Yes    Pain Score  4     Pain Location  Knee    Pain Orientation  Left    Pain Descriptors / Indicators  Aching    Pain Type  Chronic pain    Pain Onset  More than a month ago    Pain Frequency  Intermittent    Aggravating Factors   weight bearing     Pain Relieving Factors  rest, heat    Effect of Pain on Daily Activities  limit                       [facility] Adult PT Treatment/Exercise - 01/26/18 0001      Lumbar Exercises: Stretches   Lower Trunk Rotation  5 reps;10 seconds ball between knees and feet apart    Standing Side  Bend  -- 10x (3D hip excursion    Standing Extension  10 reps 3D hip excusion    Gastroc Stretch  2 reps;30 seconds      Lumbar Exercises: Standing   Heel Raises  10 reps    Functional Squats  10 reps;Limitations    Functional Squats Limitations  3D hip excursion infront of mat for mechanics with squats, HHA with rotation for balance      Knee/Hip Exercises: Standing   Heel Raises  10 reps    Heel Raises Limitations  Toe raises; lacking full range    SLS  3x 20" with 2 finger tips    Other Standing Knee Exercises  hip abduction 15x 3" each    Other Standing Knee Exercises  hip extension with knee flexed 10x each      Knee/Hip Exercises: Seated   Other Seated Knee/Hip Exercises  toe raises 15x 5" holds      Knee/Hip Exercises: Supine   Bridges Limitations  holding for 10"    Bridges with [employer]  Both;2 sets;10 reps      Manual Therapy   Manual Therapy  Passive ROM    Manual therapy comments  Manual complete separate than rest of tx    Passive ROM  Bil hip IR/ER and prolonged hip flexor stretch               PT Short Term Goals - 01/19/18 1506      PT SHORT TERM GOAL #1   Title  PT hip B hip ROM  to improve 6 degrees or more to ease donning and doffing of socks and shoes.     Time  3    Period  Weeks    Status  Achieved      PT SHORT TERM GOAL #2   Title  Pt pain to be decreased in both hips to no greater than a 6/10 to allow pt to be able to walk with his walker for 10 mintues     Baseline  pain is decreased pt has not tried walking     Time  3    Period  Weeks    Status  Partially Met      PT SHORT TERM GOAL #3   Title  PT Strength in B hips to increase one grade to allow pt to be able to come from sit to stand with one UE assist.     Time  3    Period  Weeks    Status  On-going      PT SHORT TERM GOAL #4   Title  PT to be able to single leg stance for 5 seconds bilaterally for reduce risk of falls.     Time  3    Period  Weeks    Status  Partially Met        PT Long Term Goals - 01/19/18 1508      PT LONG TERM GOAL #1   Title  PT B hip mobility to be improved to allow pt to lift legs out of the bath with minimal difficulty     Time  6    Period  Weeks    Status  Achieved      PT LONG TERM GOAL #2   Title  Pt hip pain  B to be no greater than a 4/10 to allow pt to walk with his walker for 20 mintues.     Time  6  Period  Weeks    Status  On-going      PT LONG TERM GOAL #3   Title  PT hip strength B to be increased one grade to allow pt to go up and down 4 steps without fear of LE giving way.     Time  6    Period  Weeks    Status  On-going      PT LONG TERM GOAL #4   Title  PT to be able to single leg stance on both legs for 10seconds to decrease risk of falling .     Time  6    Period  Weeks    Status  Not Met            Plan - 01/26/18 1720    Clinical Impression Statement  Continued session focus wiht proximal strengtheing, hip mobility and progressed balance activities.  Added hip strengthening exercises in closed chain to improve gluteal strengthening, hip mobiltiy and increase SLS, HHA was required for LOB episodes.  Positive results with  contract/relax to improve Bil IR.  No reoprts of pain at EOS.      Rehab Potential  Fair    PT Frequency  2x / week    PT Duration  6 weeks    PT Treatment/Interventions  ADLs/Self Care Home Management;Functional mobility training;Therapeutic activities;Therapeutic exercise;Patient/family education;Balance training;Manual techniques;Passive range of motion    PT Next Visit Plan  Continue hip mobility, gluteal strengthening and balance training.  Continue with 3D hip excursion with mat behind to improve form with squats    PT Home Exercise Plan  EVAL:  Hip adduction isometric; supine hip abduction B, clam B; 4/11: bridge 10" holds, S/L abduction, SLS and heel raises       Patient will benefit from skilled therapeutic intervention in order to improve the following deficits and impairments:  Abnormal gait, Decreased activity tolerance, Decreased balance, Decreased mobility, Decreased range of motion, Difficulty walking, Increased fascial restricitons, Pain  Visit Diagnosis: Pain in right hip  Pain in left hip  Unsteadiness on feet  History of falling  Difficulty in walking, not elsewhere classified     Problem List Patient Active Problem List   Diagnosis Date Noted  . Gait disturbance, post-stroke 11/02/2017  . Aphasia as late effect of cerebrovascular accident 11/02/2017  . 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 Aphasia   . PAF (paroxysmal atrial fibrillation) ([place])   . Diabetes mellitus type 2 in obese ([place])   . Benign essential HTN   . Hypothyroidism   . Hyperlipidemia   . Stroke (cerebrum) ([place]) 10/02/2017  . Afib ([place]) 01/10/2017  . SOB (shortness of breath) 01/10/2017  . Acute diastolic CHF (congestive heart failure) ([place]) 01/10/2017  . Atrial fibrillation with RVR ([place]) 01/10/2017  . Claudication ([place]) 07/25/2016  . Paroxysmal atrial fibrillation ([place]) 07/25/2016  . Odynophagia 04/10/2014  . Allergic rhinitis 03/23/2014  . Chronic rhinitis 12/22/2013   . 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 Constipation 08/22/2011  . Obesity 09/20/2009  . G E R D 11/29/2007  . Celiac disease 11/29/2007   Ihor Austin, [place]; [place]  Aldona Lento 01/26/2018, 5:26 PM  [facility] [address] [place], [place], [address] Phone: [phone]   Fax:  [phone]  Name: [name] MRN: [id number] Date of Birth: October 20, 1928

## 2018-01-28 ENCOUNTER — Other Ambulatory Visit: Payer: Self-pay | Admitting: Neurology

## 2018-01-28 ENCOUNTER — Ambulatory Visit (HOSPITAL_COMMUNITY): Payer: PPO | Admitting: Physical Therapy

## 2018-01-28 DIAGNOSIS — R262 Difficulty in walking, not elsewhere classified: Secondary | ICD-10-CM

## 2018-01-28 DIAGNOSIS — M25551 Pain in right hip: Secondary | ICD-10-CM

## 2018-01-28 DIAGNOSIS — Z9181 History of falling: Secondary | ICD-10-CM

## 2018-01-28 DIAGNOSIS — M25552 Pain in left hip: Secondary | ICD-10-CM

## 2018-01-28 DIAGNOSIS — R2681 Unsteadiness on feet: Secondary | ICD-10-CM

## 2018-01-28 MED ORDER — APIXABAN 5 MG PO TABS: 5.0000 mg | ORAL_TABLET | Freq: Two times a day (BID) | ORAL | 10 refills | Status: DC

## 2018-01-28 MED ORDER — APIXABAN 5 MG PO TABS: 5.0000 mg | ORAL_TABLET | Freq: Two times a day (BID) | ORAL | 0 refills | Status: DC

## 2018-01-28 NOTE — Therapy (Signed)
[place], [place], [address] Phone: [phone]   Fax:  [phone]  Physical Therapy Treatment  Patient Details  Name: [name] MRN: [id number] Date of Birth: 1929-06-28 Referring Provider: Wende Neighbors   Encounter Date: 01/28/2018  PT End of Session - 01/28/18 1517    Visit Number  9    Number of Visits  12    Date for PT Re-Evaluation  02/09/18 minireassess 01/19/18    Authorization Type  [employer]    Authorization Time Period  10/21/17 - 02/09/18    PT Start Time  1515    PT Stop Time  1600    PT Time Calculation (min)  45 min    Equipment Utilized During Treatment  Gait belt    Activity Tolerance  Patient tolerated treatment well    Behavior During Therapy  [facility] for tasks assessed/performed       Past Medical History:  Diagnosis Date  . Allergic rhinitis   . Asthmatic bronchitis   . Atrial fibrillation ([place])   . Celiac disease   . Cellulitis   . Diverticulosis   . DM (diabetes mellitus) ([place])   . Fx. left wrist 1987  . Gastric polyps   . GERD (gastroesophageal reflux disease)   . History of pneumonia    w/pleural effusion  . Hypothyroidism   . Iron deficiency anemia   . Melanoma ([place])    both feet  . Neuropathy   . Prostate cancer ([place])   . REM sleep behavior disorder   . RLS (restless legs syndrome)   . Sleep apnea   . Stroke [facility]) 09/2017    Past Surgical History:  Procedure Laterality Date  . APPENDECTOMY  1978  . CARPAL TUNNEL RELEASE Left   . CATARACT EXTRACTION  03/2011   bilateral   . CHOLECYSTECTOMY  2001  . COLONOSCOPY W/ BIOPSIES  04/27/2008   diverticulosis, duodenitis  . FOOT SURGERY    . HERNIA REPAIR  1994   bilateral  . KNEE SURGERY Bilateral    x 2  . LUNG SURGERY  2001   for infection  . PROSTATECTOMY  2002  . UPPER GASTROINTESTINAL ENDOSCOPY  04/27/2008   celiac disease, gastric polyps    There were no vitals filed for this visit.  Subjective  Assessment - 01/28/18 1518    Subjective  PT states that therapy is helping; he is the best he has been for awhile.      Pertinent History  CVA, OA     Limitations  Sitting;Lifting;Standing;Walking    How long can you sit comfortably?  two hours.     How long can you stand comfortably?  Pt has increased pain after 30 minutes if he leans against something .     How long can you walk comfortably?  PT states he has significant increased pain in his legs when he goes to walk.  He is not able to walk any greater than five minutes with his walker .      Patient Stated Goals  to improve whatever he can.       Currently in Pain?  Yes    Pain Score  3     Pain Location  Hip    Pain Orientation  Right;Left    Pain Descriptors / Indicators  Aching    Pain Type  Chronic pain    Pain Onset  More than a month ago    Aggravating Factors  weight bearing     Pain Relieving Factors  rest                       [facility] Adult PT Treatment/Exercise - 01/28/18 0001      Knee/Hip Exercises: Stretches   Passive Hamstring Stretch  Both;2 reps;30 seconds;Limitations    Quad Stretch  -- sidelying x 1 minute B    Hip Flexor Stretch  Both;2 reps;30 seconds    Piriformis Stretch  Both;2 reps;30 seconds    Piriformis Stretch Limitations  PROM for IR as well     Other Knee/Hip Stretches  Passive knee to chest stretch B       Knee/Hip Exercises: Standing   Heel Raises  10 reps    Heel Raises Limitations  Toe raises; lacking full range    Other Standing Knee Exercises  hip extension, abduction 15x 3" each    Other Standing Knee Exercises  tandem stance x2      Knee/Hip Exercises: Seated   Sit to Sand  15 reps      Knee/Hip Exercises: Supine   Bridges  15 reps      Knee/Hip Exercises: Sidelying   Hip ABduction  Both;10 reps    Other Sidelying Knee/Hip Exercises  resisted hip extension B x 10 each                PT Short Term Goals - 01/19/18 1506      PT SHORT TERM GOAL #1    Title  PT hip B hip ROM to improve 6 degrees or more to ease donning and doffing of socks and shoes.     Time  3    Period  Weeks    Status  Achieved      PT SHORT TERM GOAL #2   Title  Pt pain to be decreased in both hips to no greater than a 6/10 to allow pt to be able to walk with his walker for 10 mintues     Baseline  pain is decreased pt has not tried walking     Time  3    Period  Weeks    Status  Partially Met      PT SHORT TERM GOAL #3   Title  PT Strength in B hips to increase one grade to allow pt to be able to come from sit to stand with one UE assist.     Time  3    Period  Weeks    Status  On-going      PT SHORT TERM GOAL #4   Title  PT to be able to single leg stance for 5 seconds bilaterally for reduce risk of falls.     Time  3    Period  Weeks    Status  Partially Met        PT Long Term Goals - 01/19/18 1508      PT LONG TERM GOAL #1   Title  PT B hip mobility to be improved to allow pt to lift legs out of the bath with minimal difficulty     Time  6    Period  Weeks    Status  Achieved      PT LONG TERM GOAL #2   Title  Pt hip pain  B to be no greater than a 4/10 to allow pt to walk with his walker for 20 mintues.     Time  6  Period  Weeks    Status  On-going      PT LONG TERM GOAL #3   Title  PT hip strength B to be increased one grade to allow pt to go up and down 4 steps without fear of LE giving way.     Time  6    Period  Weeks    Status  On-going      PT LONG TERM GOAL #4   Title  PT to be able to single leg stance on both legs for 10seconds to decrease risk of falling .     Time  6    Period  Weeks    Status  Not Met            Plan - 01/28/18 1604    Clinical Impression Statement  Added quadricep stretches to program with significant restriction noted.  Added sit to stand without UE assist from higher levels.   Pt continues to have significant weakness in gluteal mm B.      Rehab Potential  Fair    PT Frequency  2x / week     PT Duration  6 weeks    PT Treatment/Interventions  ADLs/Self Care Home Management;Functional mobility training;Therapeutic activities;Therapeutic exercise;Patient/family education;Balance training;Manual techniques;Passive range of motion    PT Next Visit Plan  Continue hip mobility, gluteal strengthening and balance training.      PT Home Exercise Plan  EVAL:  Hip adduction isometric; supine hip abduction B, clam B; 4/11: bridge 10" holds, S/L abduction, SLS and heel raises       Patient will benefit from skilled therapeutic intervention in order to improve the following deficits and impairments:  Abnormal gait, Decreased activity tolerance, Decreased balance, Decreased mobility, Decreased range of motion, Difficulty walking, Increased fascial restricitons, Pain  Visit Diagnosis: Pain in right hip  Pain in left hip  Unsteadiness on feet  History of falling  Difficulty in walking, not elsewhere classified     Problem List Patient Active Problem List   Diagnosis Date Noted  . Gait disturbance, post-stroke 11/02/2017  . Aphasia as late effect of cerebrovascular accident 11/02/2017  . 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 Aphasia   . PAF (paroxysmal atrial fibrillation) ([place])   . Diabetes mellitus type 2 in obese ([place])   . Benign essential HTN   . Hypothyroidism   . Hyperlipidemia   . Stroke (cerebrum) ([place]) 10/02/2017  . Afib ([place]) 01/10/2017  . SOB (shortness of breath) 01/10/2017  . Acute diastolic CHF (congestive heart failure) ([place]) 01/10/2017  . Atrial fibrillation with RVR ([place]) 01/10/2017  . Claudication ([place]) 07/25/2016  . Paroxysmal atrial fibrillation ([place]) 07/25/2016  . Odynophagia 04/10/2014  . Allergic rhinitis 03/23/2014  . Chronic rhinitis 12/22/2013  . 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 Constipation 08/22/2011  . Obesity 09/20/2009  . G E R D 11/29/2007  . Celiac disease 11/29/2007    [name], PT CLT [phone] 01/28/2018,  4:09 PM  [facility] [address] [place], [place], [address] Phone: [phone]   Fax:  [phone]  Name: [name] MRN: [id number] Date of Birth: 29-Sep-1929

## 2018-01-29 NOTE — Telephone Encounter (Signed)
Received fax confirmation [employer] [id number].

## 2018-02-01 IMAGING — US US EXTREM LOW VENOUS*R*
1 series · 13 of 24 positions shown · non-contrast
Comparison: Right lower extremity venous Doppler ultrasound
-02/09/2009

CLINICAL DATA: Right lower extremity pain and edema following fall
approximately 2 months ago. History of left lower extremity DVT many
years ago. History of malignancy. Evaluate for acute or chronic DVT
versus a Baker's cyst.



[Series 1: us extrem low venous*right* · 0.09mm/px · 13 of 65 slices shown]
[im 1/65]
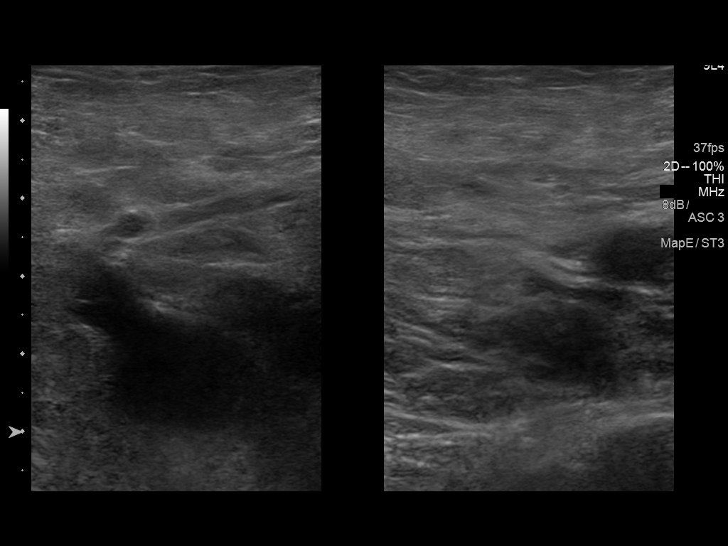
[im 6/65]
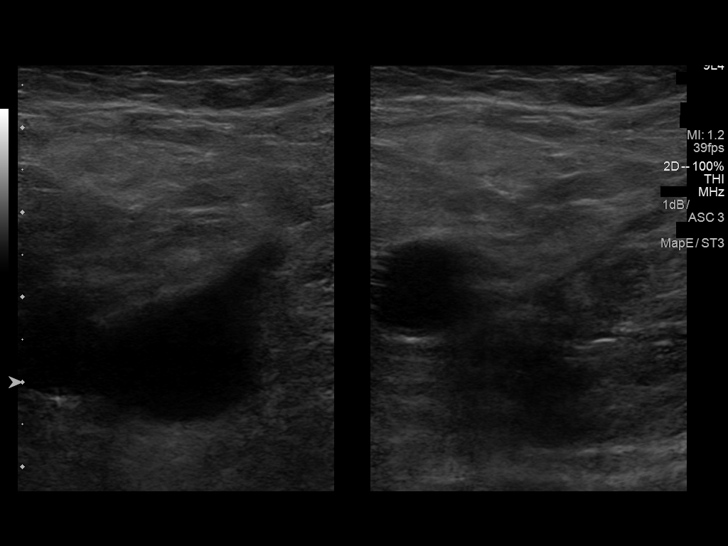
[im 12/65]
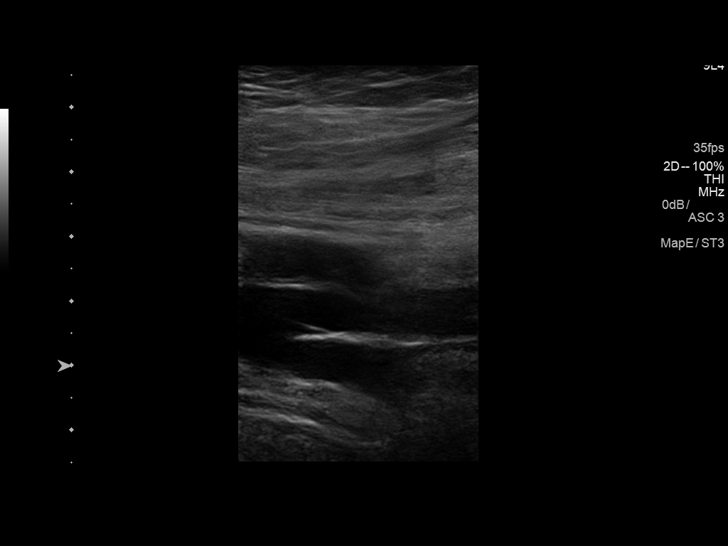
[im 17/65]
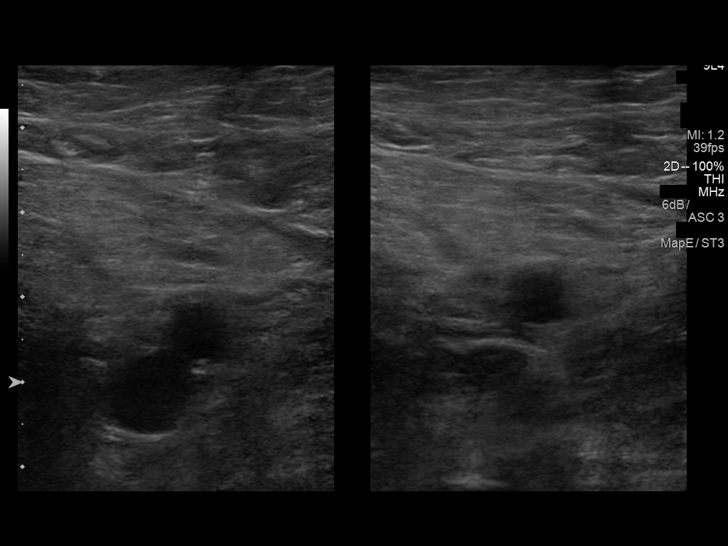
[im 23/65]
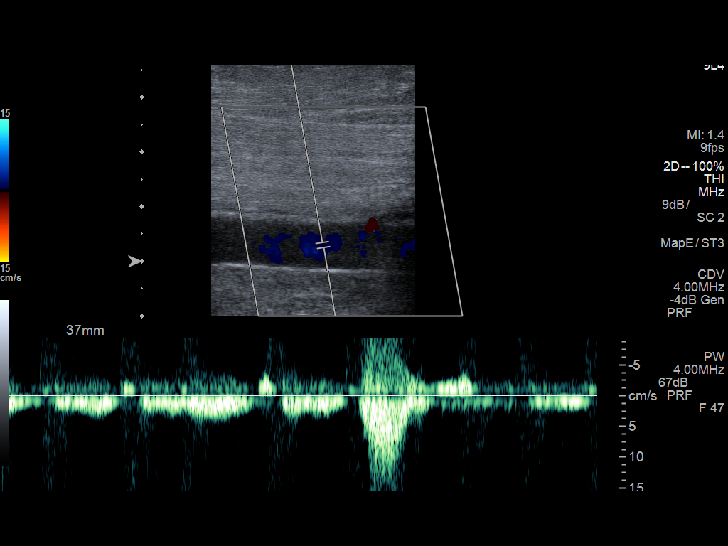
[im 28/65]
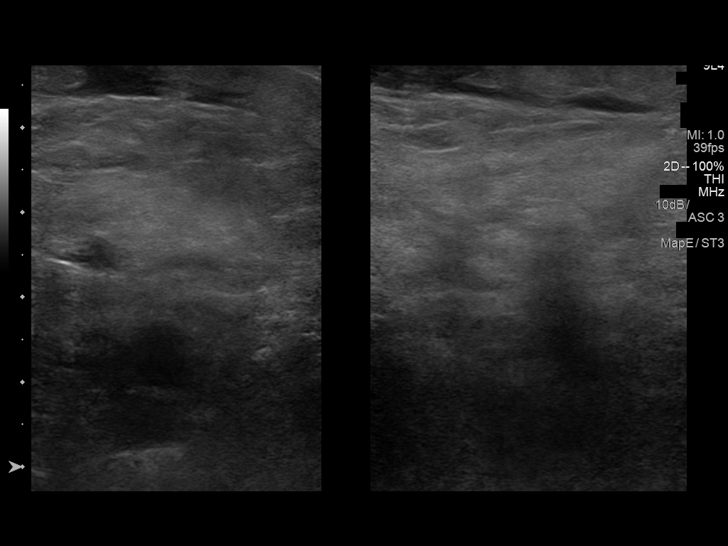
[im 34/65]
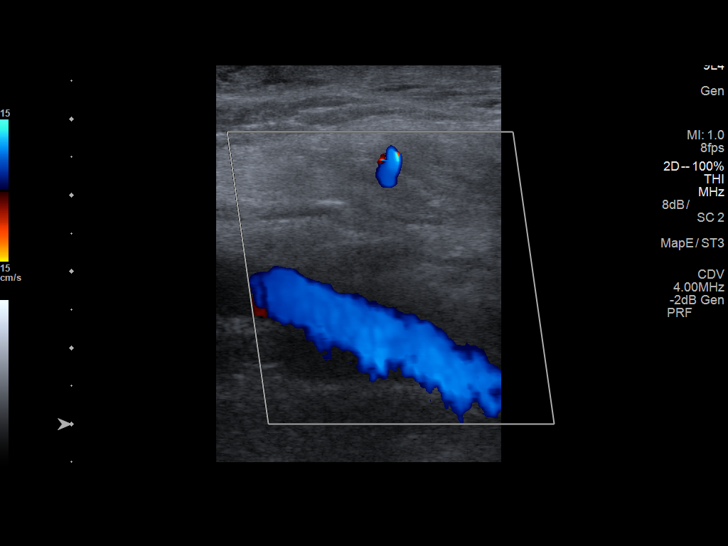
[im 37/65]
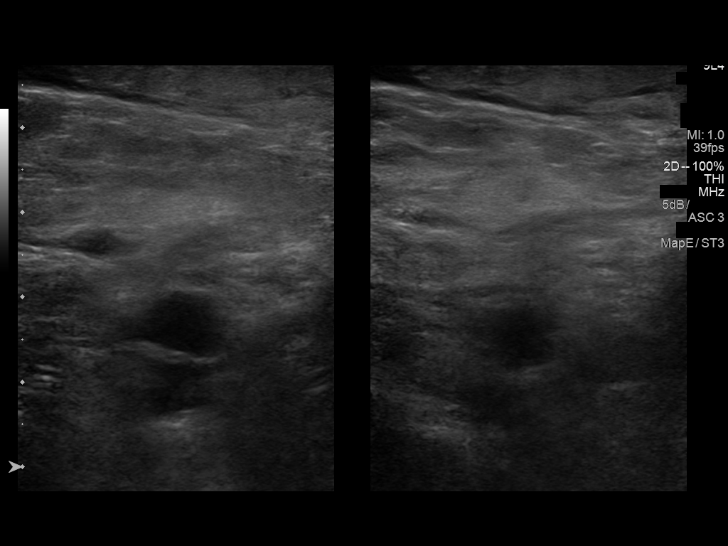
[im 42/65]
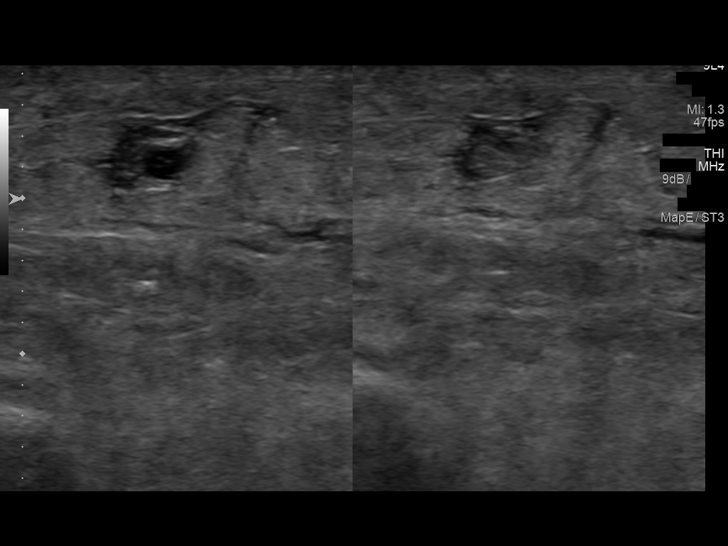
[im 48/65]
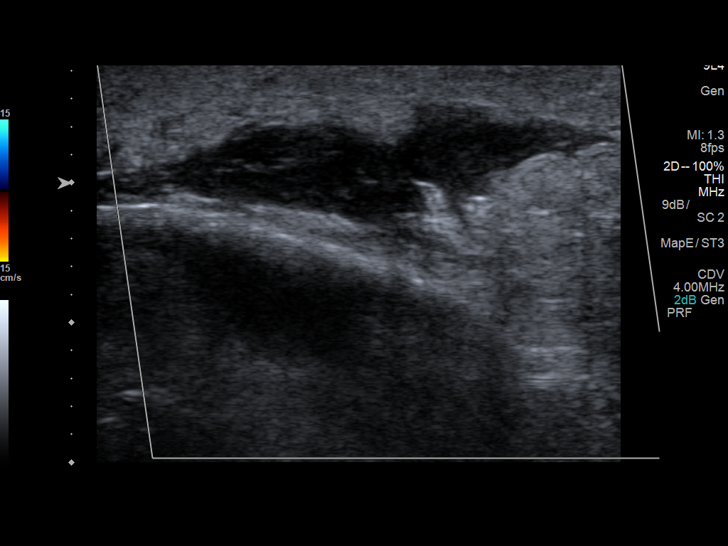
[im 53/65]
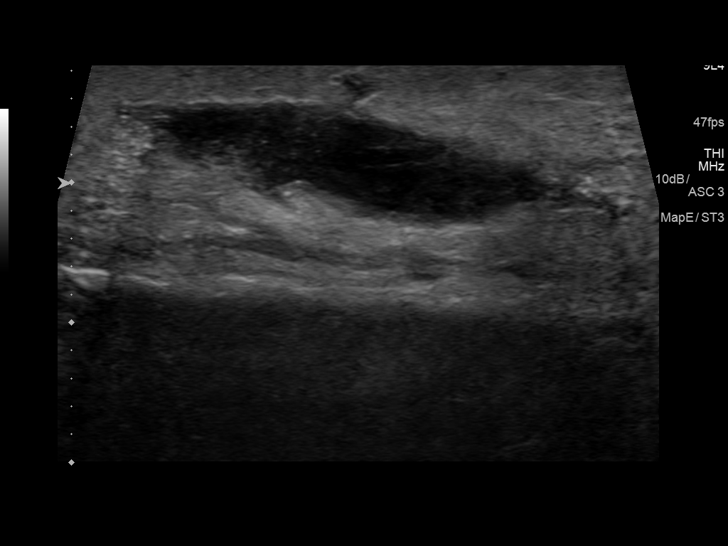
[im 59/65]
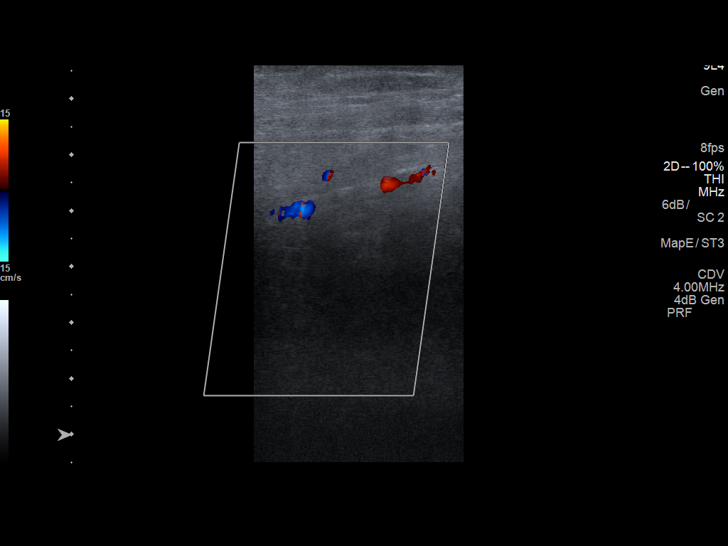
[im 65/65]
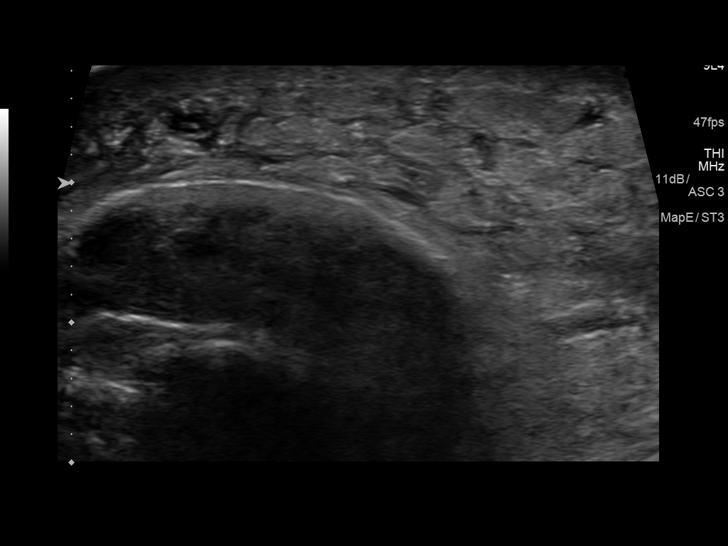

[13 of 24 positions shown; findings below may reference images not displayed]

FINDINGS: Contralateral Common Femoral Vein: Respiratory phasicity is normal
and symmetric with the symptomatic side. No evidence of thrombus.
Normal compressibility.

Common Femoral Vein: No evidence of thrombus. Normal
compressibility, respiratory phasicity and response to augmentation.

Saphenofemoral Junction: No evidence of thrombus. Normal
compressibility and flow on color Doppler imaging.

Profunda Femoral Vein: No evidence of thrombus. Normal
compressibility and flow on color Doppler imaging.

Femoral Vein: No evidence of thrombus. Normal compressibility,
respiratory phasicity and response to augmentation.

Popliteal Vein: No evidence of thrombus. Normal compressibility,
respiratory phasicity and response to augmentation.

Calf Veins: No evidence of thrombus. Normal compressibility and flow
on color Doppler imaging.

Superficial Great Saphenous Vein: No evidence of thrombus. Normal
compressibility and flow on color Doppler imaging.

Venous Reflux:  None.

Other Findings: A minimal to moderate amount of subcutaneous edema
is noted at the level of the right popliteal fossa (representative
images 42 through 45), tracking to involve the superior aspect of
the calf where a more organized collection is seen measuring
approximately 3.5 x 3.4 x 0.7 cm in diameter (representative images
49 through 57).
IMPRESSION: 1. No evidence acute or chronic DVT within the right lower
extremity.
2. Subcutaneous edema within the popliteal fossa tracks into a more
organized approximately 3.5 cm collection within the subcutaneous
tissues of the right upper calf - nonspecific though could be seen
in the setting of a leaking Baker cyst. Further evaluation with knee
MRI could be performed as indicated.

## 2018-02-02 ENCOUNTER — Ambulatory Visit (HOSPITAL_COMMUNITY): Payer: PPO | Admitting: Physical Therapy

## 2018-02-02 ENCOUNTER — Encounter (HOSPITAL_COMMUNITY): Payer: Self-pay | Admitting: Physical Therapy

## 2018-02-02 DIAGNOSIS — M25551 Pain in right hip: Secondary | ICD-10-CM

## 2018-02-02 DIAGNOSIS — R2681 Unsteadiness on feet: Secondary | ICD-10-CM

## 2018-02-02 DIAGNOSIS — Z9181 History of falling: Secondary | ICD-10-CM

## 2018-02-02 DIAGNOSIS — M25552 Pain in left hip: Secondary | ICD-10-CM

## 2018-02-02 NOTE — Therapy (Signed)
[facility] [address] [place], [place], [address] Phone: [phone]   Fax:  [phone]  Physical Therapy Treatment  Patient Details  Name: [name] MRN: [id number] Date of Birth: 11/02/1928 Referring Provider: Wende Morgan   Encounter Date: 02/02/2018  PT End of Session - 02/02/18 1527    Visit Number  10    Number of Visits  12    Date for PT Re-Evaluation  02/09/18 minireassess 01/19/18    Authorization Type  [employer]    Authorization Time Period  10/21/17 - 02/09/18    PT Start Time  1435    PT Stop Time  1515    PT Time Calculation (min)  40 min    Equipment Utilized During Treatment  Gait belt    Activity Tolerance  Patient tolerated treatment well    Behavior During Therapy  [facility] for tasks assessed/performed       Past Medical History:  Diagnosis Date  . Allergic rhinitis   . Asthmatic bronchitis   . Atrial fibrillation ([place])   . Celiac disease   . Cellulitis   . Diverticulosis   . DM (diabetes mellitus) ([place])   . Fx. left wrist 1987  . Gastric polyps   . GERD (gastroesophageal reflux disease)   . History of pneumonia    w/pleural effusion  . Hypothyroidism   . Iron deficiency anemia   . Melanoma ([place])    both feet  . Neuropathy   . Prostate cancer ([place])   . REM sleep behavior disorder   . RLS (restless legs syndrome)   . Sleep apnea   . Stroke [facility]) 09/2017    Past Surgical History:  Procedure Laterality Date  . APPENDECTOMY  1978  . CARPAL TUNNEL RELEASE Left   . CATARACT EXTRACTION  03/2011   bilateral   . CHOLECYSTECTOMY  2001  . COLONOSCOPY W/ BIOPSIES  04/27/2008   diverticulosis, duodenitis  . FOOT SURGERY    . HERNIA REPAIR  1994   bilateral  . KNEE SURGERY Bilateral    x 2  . LUNG SURGERY  2001   for infection  . PROSTATECTOMY  2002  . UPPER GASTROINTESTINAL ENDOSCOPY  04/27/2008   celiac disease, gastric polyps    There were no vitals filed for this visit.  Subjective  Assessment - 02/02/18 1434    Subjective  [name] states that he is walking for exercise.  He is walking at least 15 minutes at a time.     Pertinent History  CVA, OA     Limitations  Sitting;Lifting;Standing;Walking    How long can you sit comfortably?  two hours.     How long can you stand comfortably?  Pt has increased pain after 30 minutes if he leans against something .     How long can you walk comfortably?  PT states he has significant increased pain in his legs when he goes to walk.  He is not able to walk any greater than five minutes with his walker .      Patient Stated Goals  to improve whatever he can.       Currently in Pain?  Yes    Pain Score  2     Pain Location  Hip    Pain Orientation  Right    Pain Descriptors / Indicators  Aching    Pain Type  Chronic pain    Pain Onset  More than a month ago  Pain Frequency  Intermittent    Aggravating Factors   activity    Pain Relieving Factors  rest    Effect of Pain on Daily Activities  limits activity                       [facility] Adult PT Treatment/Exercise - 02/02/18 0001      Exercises   Exercises  Knee/Hip      Lumbar Exercises: Stretches   Passive Hamstring Stretch  Right;Left;1 rep;60 seconds    Lower Trunk Rotation  5 reps;10 seconds ball between knees and feet apart    Standing Extension  10 reps 3D hip excusion    Other Lumbar Stretch Exercise   B hip IR/ER PROM x 1'       Lumbar Exercises: Standing   Other Standing Lumbar Exercises  wall arch with heel raises x 10     Other Standing Lumbar Exercises  hip abduction/extension Rt w/ BTB x 10; Tandem stance x 2       Knee/Hip Exercises: Seated   Other Seated Knee/Hip Exercises  toe raises 15x 5" holds      Knee/Hip Exercises: Supine   Bridges  15 reps               PT Short Term Goals - 01/19/18 1506      PT SHORT TERM GOAL #1   Title  PT hip B hip ROM to improve 6 degrees or more to ease donning and doffing of socks and shoes.      Time  3    Period  Weeks    Status  Achieved      PT SHORT TERM GOAL #2   Title  Pt pain to be decreased in both hips to no greater than a 6/10 to allow pt to be able to walk with his walker for 10 mintues     Baseline  pain is decreased pt has not tried walking     Time  3    Period  Weeks    Status  Partially Met      PT SHORT TERM GOAL #3   Title  PT Strength in B hips to increase one grade to allow pt to be able to come from sit to stand with one UE assist.     Time  3    Period  Weeks    Status  On-going      PT SHORT TERM GOAL #4   Title  PT to be able to single leg stance for 5 seconds bilaterally for reduce risk of falls.     Time  3    Period  Weeks    Status  Partially Met        PT Long Term Goals - 01/19/18 1508      PT LONG TERM GOAL #1   Title  PT B hip mobility to be improved to allow pt to lift legs out of the bath with minimal difficulty     Time  6    Period  Weeks    Status  Achieved      PT LONG TERM GOAL #2   Title  Pt hip pain  B to be no greater than a 4/10 to allow pt to walk with his walker for 20 mintues.     Time  6    Period  Weeks    Status  On-going      PT LONG TERM  GOAL #3   Title  PT hip strength B to be increased one grade to allow pt to go up and down 4 steps without fear of LE giving way.     Time  6    Period  Weeks    Status  On-going      PT LONG TERM GOAL #4   Title  PT to be able to single leg stance on both legs for 10seconds to decrease risk of falling .     Time  6    Period  Weeks    Status  Not Met            Plan - 02/02/18 1528    Clinical Impression Statement  Added tandem stance and wall arch to program.  Pt needed min assist for tandem stance due to balance instability.      Rehab Potential  Fair    PT Frequency  2x / week    PT Duration  6 weeks    PT Treatment/Interventions  ADLs/Self Care Home Management;Functional mobility training;Therapeutic activities;Therapeutic exercise;Patient/family  education;Balance training;Manual techniques;Passive range of motion    PT Next Visit Plan  Reassess next visit.     PT Home Exercise Plan  EVAL:  Hip adduction isometric; supine hip abduction B, clam B; 4/11: bridge 10" holds, S/L abduction, SLS and heel raises       Patient will benefit from skilled therapeutic intervention in order to improve the following deficits and impairments:  Abnormal gait, Decreased activity tolerance, Decreased balance, Decreased mobility, Decreased range of motion, Difficulty walking, Increased fascial restricitons, Pain  Visit Diagnosis: Pain in right hip  Pain in left hip  Unsteadiness on feet  History of falling     Problem List Patient Active Problem List   Diagnosis Date Noted  . Gait disturbance, post-stroke 11/02/2017  . Aphasia as late effect of cerebrovascular accident 11/02/2017  . 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 Aphasia   . PAF (paroxysmal atrial fibrillation) ([place])   . Diabetes mellitus type 2 in obese ([place])   . Benign essential HTN   . Hypothyroidism   . Hyperlipidemia   . Stroke (cerebrum) ([place]) 10/02/2017  . Afib ([place]) 01/10/2017  . SOB (shortness of breath) 01/10/2017  . Acute diastolic CHF (congestive heart failure) ([place]) 01/10/2017  . Atrial fibrillation with RVR ([place]) 01/10/2017  . Claudication ([place]) 07/25/2016  . Paroxysmal atrial fibrillation ([place]) 07/25/2016  . Odynophagia 04/10/2014  . Allergic rhinitis 03/23/2014  . Chronic rhinitis 12/22/2013  . 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 Constipation 08/22/2011  . Obesity 09/20/2009  . G E R D 11/29/2007  . Celiac disease 11/29/2007    Rayetta Humphrey, PT CLT [phone] 02/02/2018, 3:29 PM  [facility] [address] [place], [place], [address] Phone: [phone]   Fax:  [phone]  Name: [name] MRN: [id number] Date of Birth: 11-Apr-1929

## 2018-02-04 ENCOUNTER — Ambulatory Visit (HOSPITAL_COMMUNITY): Payer: PPO | Admitting: Physical Therapy

## 2018-02-04 ENCOUNTER — Encounter (HOSPITAL_COMMUNITY): Payer: Self-pay | Admitting: Physical Therapy

## 2018-02-04 DIAGNOSIS — M25551 Pain in right hip: Secondary | ICD-10-CM | POA: Diagnosis not present

## 2018-02-04 DIAGNOSIS — M25552 Pain in left hip: Secondary | ICD-10-CM

## 2018-02-04 DIAGNOSIS — Z9181 History of falling: Secondary | ICD-10-CM

## 2018-02-04 DIAGNOSIS — R262 Difficulty in walking, not elsewhere classified: Secondary | ICD-10-CM

## 2018-02-04 DIAGNOSIS — R2681 Unsteadiness on feet: Secondary | ICD-10-CM

## 2018-02-04 NOTE — Patient Instructions (Signed)
Marching in Place: Varied Surfaces   Holding onto the kitchen counter  March in place, slowly lifting knees toward ceiling. Repeat __10__ times per session. Do ___2_ sessions per day. Copyright  VHI. All rights reserved.   Functional Quadriceps: Sit to Stand   With chair against the wall and a table or counter at your side  Sit on edge of chair, feet flat on floor. Stand upright, extending knees fully. Repeat _3-__5_ times per set. Do1____ sets per session. Do __2__ sessions per day.  http://orth.exer.us/734   Copyright  VHI. All rights reserved.  Functional Quadriceps: Chair Squat    Keeping feet flat on floor, shoulder width apart, squat as low as is comfortable. Use support as necessary.1 Repeat __10__ times per set. Do ___1_ sets per session. Do __2__ sessions per day.  http://orth.exer.us/736   Copyright  VHI. All rights reserved.

## 2018-02-04 NOTE — Therapy (Signed)
[facility] [place], [place], [address] Phone: [phone]   Fax:  [phone]  Physical Therapy Treatment  Patient Details  Name: [name] MRN: [id number] Date of Birth: 03-01-1929 Referring Provider: Wende Neighbors   Progress Note Reporting Period 12/29/2017  to 02/04/2018  See note below for Objective Data and Assessment of Progress/Goals.      Encounter Date: 02/04/2018  PT End of Session - 02/04/18 1533    Visit Number  11    Number of Visits  11    Date for PT Re-Evaluation  02/09/18 minireassess 01/19/18    Authorization Type  [employer]    Authorization Time Period  10/21/17 - 02/09/18    Authorization - Visit Number  11    Authorization - Number of Visits  11    PT Start Time  1430    PT Stop Time  1520    PT Time Calculation (min)  50 min    Equipment Utilized During Treatment  Gait belt    Activity Tolerance  Patient tolerated treatment well    Behavior During Therapy  WFL for tasks assessed/performed       Past Medical History:  Diagnosis Date  . Allergic rhinitis   . Asthmatic bronchitis   . Atrial fibrillation ([place])   . Celiac disease   . Cellulitis   . Diverticulosis   . DM (diabetes mellitus) ([place])   . Fx. left wrist 1987  . Gastric polyps   . GERD (gastroesophageal reflux disease)   . History of pneumonia    w/pleural effusion  . Hypothyroidism   . Iron deficiency anemia   . Melanoma ([place])    both feet  . Neuropathy   . Prostate cancer ([place])   . REM sleep behavior disorder   . RLS (restless legs syndrome)   . Sleep apnea   . Stroke [facility]) 09/2017    Past Surgical History:  Procedure Laterality Date  . APPENDECTOMY  1978  . CARPAL TUNNEL RELEASE Left   . CATARACT EXTRACTION  03/2011   bilateral   . CHOLECYSTECTOMY  2001  . COLONOSCOPY W/ BIOPSIES  04/27/2008   diverticulosis, duodenitis  . FOOT SURGERY    . HERNIA REPAIR  1994   bilateral  . KNEE SURGERY Bilateral    x 2  . LUNG SURGERY  2001   for infection  . PROSTATECTOMY  2002  . UPPER GASTROINTESTINAL ENDOSCOPY  04/27/2008   celiac disease, gastric polyps    There were no vitals filed for this visit.  Subjective Assessment - 02/04/18 1507    Subjective  Pt states in the past week his pain in his hips has been no more than a 3/10 .   Today they are at a 1.     Pertinent History  CVA, OA     Limitations  Sitting;Lifting;Standing;Walking    How long can you sit comfortably?  two hours.     How long can you stand comfortably?  Pt has increased pain after 30 minutes if he leans against something .     How long can you walk comfortably?  PT states he can walk for 10 minutes now with his walker was less than five.  .      Patient Stated Goals  to improve whatever he can.       Currently in Pain?  Yes    Pain Score  1     Pain Location  Hip  Pain Orientation  Right    Pain Descriptors / Indicators  Aching    Pain Type  Acute pain    Pain Onset  More than a month ago         [facility] PT Assessment - 02/04/18 0001      Assessment   Medical Diagnosis  B hip pain with radiation     Referring Provider  Zack Hall     Onset Date/Surgical Date  11/30/17    Prior Therapy  not for this diagnosis for CVA       Precautions   Precautions  Fall      Restrictions   Weight Bearing Restrictions  No      Balance Screen   Has the patient fallen in the past 6 months  Yes    How many times?  6    Has the patient had a decrease in activity level because of a fear of falling?   Yes    Is the patient reluctant to leave their home because of a fear of falling?   Yes      [place]  Private residence    Living Arrangements  Spouse/significant other    Available Help at Discharge  Family    Type of [place] to enter    Entrance Stairs-Number of Steps  -- [place]  One level      Prior Function   Level of [place]   Overall Cognitive Status  Within Functional Limits for tasks assessed      Observation/Other Assessments   Focus on Therapeutic Outcomes (FOTO)   40 was 36.      Functional Tests   Functional tests  Sit to Stand      Single Leg Stance   Comments  Rt 4 seconds left 5;  unable at initial eval      Sit to Stand   Comments  5 x 1:07       ROM / Strength   AROM / PROM / Strength  Strength      Strength   Right Hip Flexion  5/5 was a 4- at eval then 4+ on 4/9    Right Hip Extension  3+/5 was 3-    Right Hip External Rotation   4/5 was 4-    Right Hip Internal Rotation  3+/5 was 3    Right Hip ABduction  5/5 was 2/5 at eval; 3+ on 4/9 and now 5/5     Left Hip Flexion  5/5 was 2+ at eval; on 4/9 was 4/5     Left Hip Extension  3+/5 was 3-    Left Hip External Rotation  4/5 was 4    Left Hip Internal Rotation  3/5 was 2+    Left Hip ABduction  5/5 was 2/5 at eval; 3+/5 on 4/9     Right Knee Flexion  4/5    Right Knee Extension  5/5    Left Knee Flexion  5/5    Left Knee Extension  5/5    Right Ankle Dorsiflexion  2/5 was a 2    Left Ankle Dorsiflexion  2-/5 was 2-/5       Flexibility   Soft Tissue Assessment /Muscle Length  yes    Hamstrings  Rt 155 was145; Lt 142 was 145    Piriformis  RT:60  was  30; LT 50 was 45    Obturator Internus  IR of hip RT :3 was0 ; LT 3 was -8      Ambulation/Gait   Ambulation Distance (Feet)  380 Feet    Assistive device  Rolling walker    Gait Comments  3 '                    [facility] Adult PT Treatment/Exercise - 02/04/18 0001      Lumbar Exercises: Standing   Functional Squats  10 reps    Other Standing Lumbar Exercises  marching in place x 5       Lumbar Exercises: Seated   Sit to Stand  5 reps      Lumbar Exercises: Sidelying   Hip Abduction  Both;5 reps      Knee/Hip Exercises: Stretches   Active Hamstring Stretch  Both;2 reps;20 seconds               PT Short Term Goals - 02/04/18 1507      PT  SHORT TERM GOAL #1   Title  PT hip B hip ROM to improve 6 degrees or more to ease donning and doffing of socks and shoes.     Time  3    Period  Weeks    Status  Achieved      PT SHORT TERM GOAL #2   Title  Pt pain to be decreased in both hips to no greater than a 6/10 to allow pt to be able to walk with his walker for 10 mintues     Baseline  pain is decreased pt has not tried walking     Time  3    Period  Weeks    Status  Achieved      PT SHORT TERM GOAL #3   Title  PT Strength in B hips to increase one grade to allow pt to be able to come from sit to stand with one UE assist.     Time  3    Period  Weeks    Status  Achieved      PT SHORT TERM GOAL #4   Title  PT to be able to single leg stance for 5 seconds bilaterally for reduce risk of falls.     Time  3    Period  Weeks    Status  Partially Met        PT Long Term Goals - 02/04/18 1509      PT LONG TERM GOAL #1   Title  PT B hip mobility to be improved to allow pt to lift legs out of the bath with minimal difficulty     Time  6    Period  Weeks    Status  Achieved      PT LONG TERM GOAL #2   Title  Pt hip pain  B to be no greater than a 4/10 to allow pt to walk with his walker for 20 mintues.     Time  6    Period  Weeks    Status  Achieved      PT LONG TERM GOAL #3   Title  PT hip strength B to be increased one grade to allow pt to go up and down 4 steps without fear of LE giving way.     Time  6    Period  Weeks    Status  Achieved  PT LONG TERM GOAL #4   Title  PT to be able to single leg stance on both legs for 10seconds to decrease risk of falling .     Time  6    Period  Weeks    Status  Not Met            Plan - 02/04/18 1534    Clinical Impression Statement  PT reassessed.  He has made good progress towards all goals except for single leg stance.  Pt balance has been off since his stroke in December.  He is ready to be discharge to a HEP at this time.     Rehab Potential  Fair    PT  Frequency  2x / week    PT Duration  6 weeks    PT Treatment/Interventions  ADLs/Self Care Home Management;Functional mobility training;Therapeutic activities;Therapeutic exercise;Patient/family education;Balance training;Manual techniques;Passive range of motion    PT Next Visit Plan  Discharge.     PT Home Exercise Plan  EVAL:  Hip adduction isometric; supine hip abduction B, clam B; 4/11: bridge 10" holds, S/L abduction, SLS and heel raises ' 4/25:  Added standing marching , sit to stand and functional squat       Patient will benefit from skilled therapeutic intervention in order to improve the following deficits and impairments:  Abnormal gait, Decreased activity tolerance, Decreased balance, Decreased mobility, Decreased range of motion, Difficulty walking, Increased fascial restricitons, Pain  Visit Diagnosis: Pain in right hip  Pain in left hip  Unsteadiness on feet  History of falling  Difficulty in walking, not elsewhere classified     Problem List Patient Active Problem List   Diagnosis Date Noted  . Gait disturbance, post-stroke 11/02/2017  . Aphasia as late effect of cerebrovascular accident 11/02/2017  . 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 Aphasia   . PAF (paroxysmal atrial fibrillation) ([place])   . Diabetes mellitus type 2 in obese ([place])   . Benign essential HTN   . Hypothyroidism   . Hyperlipidemia   . Stroke (cerebrum) ([place]) 10/02/2017  . Afib ([place]) 01/10/2017  . SOB (shortness of breath) 01/10/2017  . Acute diastolic CHF (congestive heart failure) ([place]) 01/10/2017  . Atrial fibrillation with RVR ([place]) 01/10/2017  . Claudication ([place]) 07/25/2016  . Paroxysmal atrial fibrillation ([place]) 07/25/2016  . Odynophagia 04/10/2014  . Allergic rhinitis 03/23/2014  . Chronic rhinitis 12/22/2013  . 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 Constipation 08/22/2011  . Obesity 09/20/2009  . G E R D 11/29/2007  . Celiac disease 11/29/2007   Rayetta Humphrey,  PT CLT [phone] 02/04/2018, 3:37 PM  [facility] [place], [place], [address] Phone: [phone]   Fax:  [phone]  Name: [name] MRN: [id number] Date of Birth: [birth year] PHYSICAL THERAPY DISCHARGE SUMMARY  Visits from Start of Care: 11  Current functional level related to goals / functional outcomes: See above   Remaining deficits: See above    Education / Equipment: HEP Plan: Patient agrees to discharge.  Patient goals were met. Patient is being discharged due to meeting the stated rehab goals.  ?????      Rayetta Humphrey, [place] CLT [phone]

## 2018-02-09 ENCOUNTER — Encounter (HOSPITAL_COMMUNITY): Payer: PPO | Admitting: Physical Therapy

## 2018-02-11 ENCOUNTER — Encounter (HOSPITAL_COMMUNITY): Payer: PPO

## 2018-02-16 ENCOUNTER — Encounter (HOSPITAL_COMMUNITY): Payer: PPO | Admitting: Physical Therapy

## 2018-02-18 ENCOUNTER — Encounter (HOSPITAL_COMMUNITY): Payer: PPO | Admitting: Physical Therapy

## 2018-02-22 DIAGNOSIS — R609 Edema, unspecified: Secondary | ICD-10-CM | POA: Diagnosis not present

## 2018-02-22 DIAGNOSIS — I890 Lymphedema, not elsewhere classified: Secondary | ICD-10-CM | POA: Diagnosis not present

## 2018-03-02 ENCOUNTER — Ambulatory Visit: Payer: PPO | Admitting: Physician Assistant

## 2018-03-02 ENCOUNTER — Encounter: Payer: Self-pay | Admitting: Physician Assistant

## 2018-03-02 VITALS — BP 134/68 | HR 59 | Ht 70.0 in | Wt 226.2 lb

## 2018-03-02 DIAGNOSIS — Z8673 Personal history of transient ischemic attack (TIA), and cerebral infarction without residual deficits: Secondary | ICD-10-CM

## 2018-03-02 DIAGNOSIS — E119 Type 2 diabetes mellitus without complications: Secondary | ICD-10-CM

## 2018-03-02 DIAGNOSIS — I480 Paroxysmal atrial fibrillation: Secondary | ICD-10-CM | POA: Diagnosis not present

## 2018-03-02 DIAGNOSIS — E039 Hypothyroidism, unspecified: Secondary | ICD-10-CM | POA: Diagnosis not present

## 2018-03-02 MED ORDER — ATORVASTATIN CALCIUM 20 MG PO TABS: 20.0000 mg | ORAL_TABLET | Freq: Every day | ORAL | 3 refills | Status: DC

## 2018-03-02 NOTE — Patient Instructions (Signed)
Medication Instructions:  Your physician recommends that you continue on your current medications as directed. Please refer to the Current Medication list given to you today.  Labwork: None   Testing/Procedures: None   Follow-Up: Your physician recommends that you schedule a follow-up appointment in: 4 months with Dr Domenic Polite  Any Other Special Instructions Will Be Listed Below (If Applicable).  If you need a refill on your cardiac medications before your next appointment, please call your pharmacy.

## 2018-03-02 NOTE — Progress Notes (Signed)
Cardiology Office Note    Date:  03/02/2018   ID:  [name], [name] 09-Mar-1929, MRN [id number]  PCP:  Calvin Squibb, MD  Cardiologist:  Dr. Gwenlyn Morgan  Chief Complaint  Patient presents with  . Follow-up    seen for Dr. Gwenlyn Morgan.    History of Present Illness:  [name] is a 84 y.o. male with PMH of DM, hypothyroidism, RLS, OSA, h/o CVA in 09/2017 and chronic atrial fibrillation. She has been followed by Dr. Gwenlyn Morgan for PVD evaluation. He did have lower extremity arterial Doppler which revealed normal ABIs bilaterally. Patient he had an event monitor that showed atrial fibrillation with RVR. He was not anticoagulated due to fall risk. Otherwise he never had any heart issue. He was admitted to the hospital in March 6720 with diastolic heart failure and discharged later after diuresing 3L. He was started on Zaroxolyn by his primary care provider prior to his a.m. Lasix dose. Because of increasing weight and lower extremity edema, he was placed on Lasix twice a day by Dr. Gwenlyn Morgan on 02/13/2017. Lasix was later cut back to once daily after renal function worsened, repeat basic metabolic panel obtained on 03/12/2017 showed renal function has improved.  I last saw patient in August 2018, he was in atrial fibrillation at the time, heart rate was controlled.  He was not on systemic anticoagulation given history of bleed and fall. Patient was seen by Dr. Gwenlyn Morgan in November, he was stable at the time.  Unfortunately in December 2018, he was admitted with left MCA stroke.  Echocardiogram obtained at the time showed mild LVH, EF 60 to 65%, mild aortic regurgitation.  Since then, he has went through rehab.  Majority of his neurological deficit has resolved, the only thing left is slight weakness in the right lower extremity.  Otherwise, he was started on Eliquis 5 mg twice daily after his stroke since the cause of stroke was felt to be related to atrial fibrillation.  Since his stroke, he has been maintaining  sinus rhythm.  We discussed again the risk of fall and bleeding versus the risk of stroke.  He did have 3-4 episode of fall after being released from the hospital in December, however since he started on physical therapy, he has been walking around with a walker to make sure he has something to lean on.  He has not had any further falling episodes since January.  He also has not noticed any bleeding issues.  Last blood work was checked 3 months ago at his PCPs office.  He will has been instructed not to drive after the stroke, his wife has been driving him around however she cannot drive very far either.  From the transportation perspective, patient wished to be established with water cardiologist in [place], he is wife has been looking online and a wish to have him established with Dr. Domenic Morgan.  I will check with both Dr. Alvester Morgan and Dr. Domenic Morgan prior to setting up a 25-month visit.     Past Medical History:  Diagnosis Date  . Allergic rhinitis   . Asthmatic bronchitis   . Atrial fibrillation ([place])   . Celiac disease   . Cellulitis   . Diverticulosis   . DM (diabetes mellitus) ([place])   . Fx. left wrist 1987  . Gastric polyps   . GERD (gastroesophageal reflux disease)   . History of pneumonia    w/pleural effusion  . Hypothyroidism   . Iron deficiency anemia   .  Melanoma ([place])    both feet  . Neuropathy   . Prostate cancer ([place])   . REM sleep behavior disorder   . RLS (restless legs syndrome)   . Sleep apnea   . Stroke [facility]) 09/2017    Past Surgical History:  Procedure Laterality Date  . APPENDECTOMY  1978  . CARPAL TUNNEL RELEASE Left   . CATARACT EXTRACTION  03/2011   bilateral   . CHOLECYSTECTOMY  2001  . COLONOSCOPY W/ BIOPSIES  04/27/2008   diverticulosis, duodenitis  . FOOT SURGERY    . HERNIA REPAIR  1994   bilateral  . KNEE SURGERY Bilateral    x 2  . LUNG SURGERY  2001   for infection  . PROSTATECTOMY  2002  . UPPER GASTROINTESTINAL ENDOSCOPY  04/27/2008    celiac disease, gastric polyps    Current Medications: Outpatient Medications Prior to Visit  Medication Sig Dispense Refill  . amitriptyline (ELAVIL) 75 MG tablet Take 75 mg by mouth at bedtime.    Marland Kitchen apixaban (ELIQUIS) 5 MG TABS tablet Take 1 tablet (5 mg total) by mouth 2 (two) times daily. 60 tablet 0  . Ascorbic Acid (VITAMIN C) 1000 MG tablet Take 1,000 mg by mouth 2 (two) times daily.      Marland Kitchen azelastine (ASTELIN) 0.1 % nasal spray Place 1 spray into both nostrils at bedtime.     . Cholecalciferol (VITAMIN D3) 1000 UNITS CAPS Take 3,000 Units by mouth 3 (three) times daily.     . diclofenac sodium (VOLTAREN) 1 % GEL Apply 2 g topically 4 (four) times daily. 1 Tube 0  . fluticasone (FLONASE) 50 MCG/ACT nasal spray Place 1 spray into both nostrils daily.     . furosemide (LASIX) 20 MG tablet Take 1 tablet (20 mg total) by mouth daily. 60 tablet 3  . glipiZIDE (GLUCOTROL XL) 2.5 MG 24 hr tablet Take 5 mg by mouth daily with breakfast.     . JANUVIA 100 MG tablet 100 mg daily. morning    . levocetirizine (XYZAL) 5 MG tablet Take 5 mg by mouth at bedtime.     Marland Kitchen levothyroxine (SYNTHROID, LEVOTHROID) 150 MCG tablet Take 1 tablet (150 mcg total) by mouth daily before breakfast. 30 tablet 0  . magnesium gluconate (MAGONATE) 500 MG tablet Take 500 mg by mouth daily.      Marland Kitchen MAGNESIUM MALATE PO Take 500 mg by mouth daily.    . Melatonin 3 MG TABS Take 3 mg by mouth at bedtime.     . metoprolol tartrate (LOPRESSOR) 25 MG tablet Take 75 mg by mouth 2 (two) times daily.    . Multiple Vitamin (MULTIVITAMIN) tablet Take 1 tablet by mouth daily.      . Omega-3 Fatty Acids (FISH OIL) 1000 MG CAPS Take 1 capsule by mouth daily.    . pantoprazole (PROTONIX) 40 MG tablet Take 1 tablet (40 mg total) by mouth daily. 30 tablet 0  . atorvastatin (LIPITOR) 20 MG tablet Take 20 mg by mouth daily.    Marland Kitchen atorvastatin (LIPITOR) 40 MG tablet Take 1 tablet (40 mg total) by mouth daily at 6 PM. (Patient taking  differently: Take 20 mg by mouth daily at 6 PM. ) 30 tablet 0  . Metoprolol Tartrate 75 MG TABS Take 75 mg by mouth 2 (two) times daily. 60 tablet 0   No facility-administered medications prior to visit.      Allergies:   Clindamycin; Gluten meal; Hydrocodone; Penicillins; Procaine; Sulfonamide derivatives; Tape;  Wheat bran; Cephalexin; and Mold extract [trichophyton]   Social History   Socioeconomic History  . Marital status: Married    Spouse name: [name]  . Number of children: 3  . Years of education: college degree  . Highest education level: Not on file  Occupational History  . Occupation: Retired    Comment: Careers adviser  . Financial resource strain: Not on file  . Food insecurity:    Worry: Not on file    Inability: Not on file  . Transportation needs:    Medical: Not on file    Non-medical: Not on file  Tobacco Use  . Smoking status: Never Smoker  . Smokeless tobacco: Never Used  Substance and Sexual Activity  . Alcohol use: No  . Drug use: No  . Sexual activity: Never    Birth control/protection: Abstinence  Lifestyle  . Physical activity:    Days per week: Not on file    Minutes per session: Not on file  . Stress: Not on file  Relationships  . Social connections:    Talks on phone: Not on file    Gets together: Not on file    Attends religious service: Not on file    Active member of club or organization: Not on file    Attends meetings of clubs or organizations: Not on file    Relationship status: Not on file  Other Topics Concern  . 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   ROS:   Please see the history of present illness.    ROS  All other systems reviewed and are negative.   PHYSICAL EXAM:   VS:  BP 134/68   Pulse (!) 59   Ht 5\' 10"  (1.778 m)   Wt 226 lb 3.2 oz (102.6 kg)   BMI 32.46 kg/m    GEN: Well nourished, well developed, in no acute distress  HEENT: normal  Neck: no JVD, carotid bruits, or masses Cardiac: RRR; no rubs, or gallops,no edema  1/6 murmur at RUSB Respiratory:  clear to auscultation bilaterally, normal work of breathing GI: soft, nontender, nondistended, + BS MS: no deformity or atrophy  Skin: warm and dry, no rash Neuro:  Alert and Oriented x 3, Strength and sensation are intact Psych: euthymic mood, full affect  Wt Readings from Last 3 Encounters:  03/02/18 226 lb 3.2 oz (102.6 kg)  12/01/17 230 lb 12.8 oz (104.7 kg)  10/16/17 214 lb 1.1 oz (97.1 kg)      Studies/Labs Reviewed:   EKG:  EKG is ordered today.  The ekg ordered today demonstrates normal sinus rhythm, right bundle branch block  Recent Labs: 10/07/2017: Magnesium 1.8; TSH 10.604 10/08/2017: ALT 33; BUN 20; Creatinine, Ser 1.21; Hemoglobin 12.6; Platelets 309; Potassium 3.5; Sodium 137   Lipid Panel    Component Value Date/Time   CHOL 160 10/03/2017 0833   TRIG 164 (H) 10/03/2017 0833   HDL 37 (L) 10/03/2017 0833   CHOLHDL 4.3 10/03/2017 0833   VLDL 33 10/03/2017 0833   LDLCALC 90 10/03/2017 0833    Additional studies/ records that were reviewed today include:   Echo 10/03/2017 LV  EF: 60% -   65%  ------------------------------------------------------------------- Indications:      CVA 55.  ------------------------------------------------------------------- History:   PMH:  GERD. Sleep Apnea.  Atrial fibrillation.  Risk factors:  Diabetes mellitus.  ------------------------------------------------------------------- Study Conclusions  - Left ventricle: The cavity size was normal. Wall thickness was   increased in a pattern of mild LVH. Systolic function was normal.   The estimated ejection  fraction was in the range of 60% to 65%. - Aortic valve: AV is thickened, calcified Difficult to see well   Peak and mean gradients through the valve are 35 and 19 mm Hg   respectively consistent with mild AS. There was mild   regurgitation.    ASSESSMENT:    1. Paroxysmal atrial fibrillation (HCC)   2. H/O: CVA (cerebrovascular accident)   3. Hypothyroidism, unspecified type   4. Controlled type 2 diabetes mellitus without complication, without long-term current use of insulin (HCC)      PLAN:  In order of problems listed above:  1. Paroxysmal atrial fibrillation: CHA2DS2-Vasc score 5 (age, CVA, DM II), currently maintaining sinus rhythm on metoprolol 75 mg twice daily.  Continue Eliquis, no obvious bleeding.  He did have several episode of fall right after discharge with stroke, however after his physical therapy and with the help of his rolling walker, he has not had a fall in the past 5 months.   -Due to transportation issues, patient wished to be set up in [place] office.  I will contact both Dr. Domenic Morgan and Dr. Gwenlyn Morgan to obtain consent for follow-up in [place] in 4 month  2. History of CVA: Felt to be related to atrial fibrillation.  He was not placed on Eliquis before due to concern of bleeding and frequent fall.  CVA occurred in December 2018, since then he has been placed on Eliquis.  3. Hypothyroidism: Managed by primary care provider  4. DM2: Managed by primary care provider    Medication Adjustments/Labs and Tests Ordered: Current medicines are reviewed at length with the patient today.  Concerns regarding medicines are outlined above.  Medication changes, Labs and Tests ordered today are listed in the Patient Instructions below. Patient Instructions  Medication Instructions:  Your physician recommends that you continue on your current medications as directed. Please refer to the Current Medication list given to you today.  Labwork: None    Testing/Procedures: None   Follow-Up: Your physician recommends that you schedule a follow-up appointment in: 4 months with Dr [name]  Any Other Special Instructions Will Be Listed Below (If Applicable).  If you need a refill on your cardiac medications before your next appointment, please call your pharmacy.     Hilbert Corrigan, [name]  03/02/2018 6:10 PM    [facility] [place], [place], [place]  [address] Phone: [phone]; Fax: [phone]

## 2018-03-11 DIAGNOSIS — E119 Type 2 diabetes mellitus without complications: Secondary | ICD-10-CM | POA: Diagnosis not present

## 2018-03-12 DIAGNOSIS — Z961 Presence of intraocular lens: Secondary | ICD-10-CM | POA: Diagnosis not present

## 2018-03-12 DIAGNOSIS — H35363 Drusen (degenerative) of macula, bilateral: Secondary | ICD-10-CM | POA: Diagnosis not present

## 2018-03-12 DIAGNOSIS — E119 Type 2 diabetes mellitus without complications: Secondary | ICD-10-CM | POA: Diagnosis not present

## 2018-03-12 DIAGNOSIS — H524 Presbyopia: Secondary | ICD-10-CM | POA: Diagnosis not present

## 2018-03-16 DIAGNOSIS — E559 Vitamin D deficiency, unspecified: Secondary | ICD-10-CM | POA: Diagnosis not present

## 2018-03-16 DIAGNOSIS — E785 Hyperlipidemia, unspecified: Secondary | ICD-10-CM | POA: Diagnosis not present

## 2018-03-16 DIAGNOSIS — E119 Type 2 diabetes mellitus without complications: Secondary | ICD-10-CM | POA: Diagnosis not present

## 2018-03-24 DIAGNOSIS — K900 Celiac disease: Secondary | ICD-10-CM | POA: Diagnosis not present

## 2018-03-24 DIAGNOSIS — I482 Chronic atrial fibrillation: Secondary | ICD-10-CM | POA: Diagnosis not present

## 2018-03-24 DIAGNOSIS — E1140 Type 2 diabetes mellitus with diabetic neuropathy, unspecified: Secondary | ICD-10-CM | POA: Diagnosis not present

## 2018-03-24 DIAGNOSIS — I5031 Acute diastolic (congestive) heart failure: Secondary | ICD-10-CM | POA: Diagnosis not present

## 2018-03-24 DIAGNOSIS — I639 Cerebral infarction, unspecified: Secondary | ICD-10-CM | POA: Diagnosis not present

## 2018-03-24 DIAGNOSIS — I10 Essential (primary) hypertension: Secondary | ICD-10-CM | POA: Diagnosis not present

## 2018-03-24 DIAGNOSIS — D509 Iron deficiency anemia, unspecified: Secondary | ICD-10-CM | POA: Diagnosis not present

## 2018-03-24 DIAGNOSIS — E782 Mixed hyperlipidemia: Secondary | ICD-10-CM | POA: Diagnosis not present

## 2018-03-24 DIAGNOSIS — R5383 Other fatigue: Secondary | ICD-10-CM | POA: Diagnosis not present

## 2018-03-24 DIAGNOSIS — E875 Hyperkalemia: Secondary | ICD-10-CM | POA: Diagnosis not present

## 2018-03-24 DIAGNOSIS — R944 Abnormal results of kidney function studies: Secondary | ICD-10-CM | POA: Diagnosis not present

## 2018-03-24 DIAGNOSIS — R05 Cough: Secondary | ICD-10-CM | POA: Diagnosis not present

## 2018-03-25 ENCOUNTER — Other Ambulatory Visit: Payer: Self-pay | Admitting: Neurology

## 2018-03-31 ENCOUNTER — Encounter: Payer: Self-pay | Admitting: Podiatry

## 2018-03-31 ENCOUNTER — Ambulatory Visit: Payer: PPO | Admitting: Podiatry

## 2018-03-31 DIAGNOSIS — B351 Tinea unguium: Secondary | ICD-10-CM | POA: Diagnosis not present

## 2018-03-31 DIAGNOSIS — M79609 Pain in unspecified limb: Secondary | ICD-10-CM

## 2018-03-31 DIAGNOSIS — D689 Coagulation defect, unspecified: Secondary | ICD-10-CM

## 2018-04-01 NOTE — Progress Notes (Signed)
Subjective:   Patient ID: [name], male   DOB: 84 y.o.   MRN: [id number]   HPI Patient presents with thick yellow brittle nailbeds 1-5 both feet that are bothersome   ROS      Objective:  Physical Exam  Thick yellow brittle nailbeds 1-5 both feet that are painful when palpated     Assessment:  Mycotic nail infection 1-5 both feet     Plan:  Debride painful nailbeds 1-5 both feet with no iatrogenic bleeding noted

## 2018-04-23 DIAGNOSIS — E1142 Type 2 diabetes mellitus with diabetic polyneuropathy: Secondary | ICD-10-CM | POA: Diagnosis not present

## 2018-05-05 DIAGNOSIS — I10 Essential (primary) hypertension: Secondary | ICD-10-CM | POA: Diagnosis not present

## 2018-05-05 DIAGNOSIS — E1142 Type 2 diabetes mellitus with diabetic polyneuropathy: Secondary | ICD-10-CM | POA: Diagnosis not present

## 2018-05-05 DIAGNOSIS — E119 Type 2 diabetes mellitus without complications: Secondary | ICD-10-CM | POA: Diagnosis not present

## 2018-05-05 DIAGNOSIS — I480 Paroxysmal atrial fibrillation: Secondary | ICD-10-CM | POA: Diagnosis not present

## 2018-05-05 DIAGNOSIS — E1140 Type 2 diabetes mellitus with diabetic neuropathy, unspecified: Secondary | ICD-10-CM | POA: Diagnosis not present

## 2018-05-05 DIAGNOSIS — I482 Chronic atrial fibrillation: Secondary | ICD-10-CM | POA: Diagnosis not present

## 2018-05-05 DIAGNOSIS — R600 Localized edema: Secondary | ICD-10-CM | POA: Diagnosis not present

## 2018-05-05 DIAGNOSIS — I5031 Acute diastolic (congestive) heart failure: Secondary | ICD-10-CM | POA: Diagnosis not present

## 2018-05-05 DIAGNOSIS — E782 Mixed hyperlipidemia: Secondary | ICD-10-CM | POA: Diagnosis not present

## 2018-05-05 DIAGNOSIS — K900 Celiac disease: Secondary | ICD-10-CM | POA: Diagnosis not present

## 2018-06-03 DIAGNOSIS — I10 Essential (primary) hypertension: Secondary | ICD-10-CM | POA: Diagnosis not present

## 2018-06-03 DIAGNOSIS — Z6832 Body mass index (BMI) 32.0-32.9, adult: Secondary | ICD-10-CM | POA: Diagnosis not present

## 2018-06-03 DIAGNOSIS — E782 Mixed hyperlipidemia: Secondary | ICD-10-CM | POA: Diagnosis not present

## 2018-06-03 DIAGNOSIS — E1140 Type 2 diabetes mellitus with diabetic neuropathy, unspecified: Secondary | ICD-10-CM | POA: Diagnosis not present

## 2018-06-03 DIAGNOSIS — G9009 Other idiopathic peripheral autonomic neuropathy: Secondary | ICD-10-CM | POA: Diagnosis not present

## 2018-06-03 DIAGNOSIS — I5031 Acute diastolic (congestive) heart failure: Secondary | ICD-10-CM | POA: Diagnosis not present

## 2018-06-03 DIAGNOSIS — I482 Chronic atrial fibrillation: Secondary | ICD-10-CM | POA: Diagnosis not present

## 2018-06-03 DIAGNOSIS — M79643 Pain in unspecified hand: Secondary | ICD-10-CM | POA: Diagnosis not present

## 2018-06-03 DIAGNOSIS — R600 Localized edema: Secondary | ICD-10-CM | POA: Diagnosis not present

## 2018-06-03 DIAGNOSIS — L859 Epidermal thickening, unspecified: Secondary | ICD-10-CM | POA: Diagnosis not present

## 2018-06-09 DIAGNOSIS — C44729 Squamous cell carcinoma of skin of left lower limb, including hip: Secondary | ICD-10-CM | POA: Diagnosis not present

## 2018-06-23 DIAGNOSIS — E119 Type 2 diabetes mellitus without complications: Secondary | ICD-10-CM | POA: Diagnosis not present

## 2018-06-25 DIAGNOSIS — E559 Vitamin D deficiency, unspecified: Secondary | ICD-10-CM | POA: Diagnosis not present

## 2018-06-25 DIAGNOSIS — I10 Essential (primary) hypertension: Secondary | ICD-10-CM | POA: Diagnosis not present

## 2018-06-25 DIAGNOSIS — E782 Mixed hyperlipidemia: Secondary | ICD-10-CM | POA: Diagnosis not present

## 2018-06-25 DIAGNOSIS — E1140 Type 2 diabetes mellitus with diabetic neuropathy, unspecified: Secondary | ICD-10-CM | POA: Diagnosis not present

## 2018-06-25 DIAGNOSIS — R944 Abnormal results of kidney function studies: Secondary | ICD-10-CM | POA: Diagnosis not present

## 2018-06-25 DIAGNOSIS — I482 Chronic atrial fibrillation: Secondary | ICD-10-CM | POA: Diagnosis not present

## 2018-06-25 DIAGNOSIS — I5031 Acute diastolic (congestive) heart failure: Secondary | ICD-10-CM | POA: Diagnosis not present

## 2018-06-30 DIAGNOSIS — Z85828 Personal history of other malignant neoplasm of skin: Secondary | ICD-10-CM | POA: Diagnosis not present

## 2018-06-30 DIAGNOSIS — Z08 Encounter for follow-up examination after completed treatment for malignant neoplasm: Secondary | ICD-10-CM | POA: Diagnosis not present

## 2018-07-01 ENCOUNTER — Ambulatory Visit: Payer: PPO | Admitting: Podiatry

## 2018-07-01 DIAGNOSIS — L97921 Non-pressure chronic ulcer of unspecified part of left lower leg limited to breakdown of skin: Secondary | ICD-10-CM | POA: Diagnosis not present

## 2018-07-01 DIAGNOSIS — I69320 Aphasia following cerebral infarction: Secondary | ICD-10-CM | POA: Diagnosis not present

## 2018-07-01 DIAGNOSIS — I69351 Hemiplegia and hemiparesis following cerebral infarction affecting right dominant side: Secondary | ICD-10-CM | POA: Diagnosis not present

## 2018-07-01 DIAGNOSIS — M21379 Foot drop, unspecified foot: Secondary | ICD-10-CM | POA: Diagnosis not present

## 2018-07-01 DIAGNOSIS — I63412 Cerebral infarction due to embolism of left middle cerebral artery: Secondary | ICD-10-CM | POA: Diagnosis not present

## 2018-07-01 DIAGNOSIS — L0213 Carbuncle of neck: Secondary | ICD-10-CM | POA: Diagnosis not present

## 2018-07-01 DIAGNOSIS — R5383 Other fatigue: Secondary | ICD-10-CM | POA: Diagnosis not present

## 2018-07-01 DIAGNOSIS — Z6831 Body mass index (BMI) 31.0-31.9, adult: Secondary | ICD-10-CM | POA: Diagnosis not present

## 2018-07-01 DIAGNOSIS — L97811 Non-pressure chronic ulcer of other part of right lower leg limited to breakdown of skin: Secondary | ICD-10-CM | POA: Diagnosis not present

## 2018-07-01 DIAGNOSIS — Z6833 Body mass index (BMI) 33.0-33.9, adult: Secondary | ICD-10-CM | POA: Diagnosis not present

## 2018-07-01 DIAGNOSIS — Z6830 Body mass index (BMI) 30.0-30.9, adult: Secondary | ICD-10-CM | POA: Diagnosis not present

## 2018-07-01 DIAGNOSIS — E782 Mixed hyperlipidemia: Secondary | ICD-10-CM | POA: Diagnosis not present

## 2018-07-05 ENCOUNTER — Ambulatory Visit: Payer: PPO | Admitting: Cardiology

## 2018-07-05 ENCOUNTER — Encounter: Payer: Self-pay | Admitting: Cardiology

## 2018-07-05 VITALS — BP 142/68 | HR 74 | Ht 70.0 in | Wt 224.0 lb

## 2018-07-05 DIAGNOSIS — I480 Paroxysmal atrial fibrillation: Secondary | ICD-10-CM | POA: Diagnosis not present

## 2018-07-05 DIAGNOSIS — E118 Type 2 diabetes mellitus with unspecified complications: Secondary | ICD-10-CM | POA: Diagnosis not present

## 2018-07-05 DIAGNOSIS — Z8673 Personal history of transient ischemic attack (TIA), and cerebral infarction without residual deficits: Secondary | ICD-10-CM

## 2018-07-05 DIAGNOSIS — I350 Nonrheumatic aortic (valve) stenosis: Secondary | ICD-10-CM

## 2018-07-05 NOTE — Patient Instructions (Addendum)
Your physician wants you to follow-up in:6 months with Dr.McDowell You will receive a reminder letter in the mail two months in advance. If you don't receive a letter, please call our office to schedule the follow-up appointment.   Your physician recommends that you continue on your current medications as directed. Please refer to the Current Medication list given to you today.   If you need a refill on your cardiac medications before your next appointment, please call your pharmacy.    No lab work or tests ordered today.      Thank you for choosing [facility] !         

## 2018-07-05 NOTE — Progress Notes (Signed)
Cardiology Office Note  Date: 07/05/2018   ID: [name], [name] 1928-11-13, MRN [id number]  PCP: Celene Squibb, MD  Evaluating Cardiologist: Rozann Lesches, MD   Chief Complaint  Patient presents with  . PAF    History of Present Illness: [name] is an 84 y.o. male that I am meeting for the first time today.  He is a former patient of Dr. Gwenlyn Found, transitioning to the [place] office.  Most recent office visit was in May of this year with [name].  I reviewed his records and updated the chart.  He is here today with his wife.  He does not report any palpitations or chest pain, has had no recent falls or syncope.  He suffered a stroke in December 2018, uses a walker.  Still has trouble finding words, sometimes intermittent visual changes and headaches.  Nothing has been progressive however.  CHADSVASC score is 5.  He continues on Eliquis along with Lopressor.  He does not report any obvious bleeding problems.  I did review his last set of lab work.  He will be seeing Dr. Nevada Crane the next few weeks for repeat assessment.  Echocardiogram and carotid Dopplers from December 2018 are outlined below.  Past Medical History:  Diagnosis Date  . Allergic rhinitis   . Asthmatic bronchitis   . Celiac disease   . Cellulitis   . Diverticulosis   . Fx. left wrist 1987  . Gastric polyps   . GERD (gastroesophageal reflux disease)   . History of pneumonia   . Hypothyroidism   . Iron deficiency anemia   . Melanoma ([place])   . Neuropathy   . PAF (paroxysmal atrial fibrillation) ([place])   . Prostate cancer ([place])   . REM sleep behavior disorder   . RLS (restless legs syndrome)   . Sleep apnea   . Stroke ([place]) 09/2017  . Type 2 diabetes mellitus ([place])     Past Surgical History:  Procedure Laterality Date  . APPENDECTOMY  1978  . CARPAL TUNNEL RELEASE Left   . CATARACT EXTRACTION  03/2011   bilateral   . CHOLECYSTECTOMY  2001  . COLONOSCOPY W/ BIOPSIES  04/27/2008   diverticulosis, duodenitis  . FOOT SURGERY    . HERNIA REPAIR  1994   bilateral  . KNEE SURGERY Bilateral    x 2  . LUNG SURGERY  2001   for infection  . PROSTATECTOMY  2002  . UPPER GASTROINTESTINAL ENDOSCOPY  04/27/2008   celiac disease, gastric polyps    Current Outpatient Medications  Medication Sig Dispense Refill  . amitriptyline (ELAVIL) 50 MG tablet Take 50 mg by mouth at bedtime.    . Ascorbic Acid (VITAMIN C) 1000 MG tablet Take 1,000 mg by mouth 2 (two) times daily.      Marland Kitchen atorvastatin (LIPITOR) 20 MG tablet Take 1 tablet (20 mg total) by mouth daily. 90 tablet 3  . azelastine (ASTELIN) 0.1 % nasal spray Place 1 spray into both nostrils at bedtime.     . Cholecalciferol (VITAMIN D3) 1000 UNITS CAPS Take 3,000 Units by mouth 3 (three) times daily.     . diclofenac sodium (VOLTAREN) 1 % GEL Apply 2 g topically 4 (four) times daily. 1 Tube 0  . ELIQUIS 5 MG TABS tablet TAKE (1) TABLET TWICE DAILY. 60 tablet 1  . fluticasone (FLONASE) 50 MCG/ACT nasal spray Place 1 spray into both nostrils daily.     . furosemide (LASIX) 20 MG tablet  Take 1 tablet (20 mg total) by mouth daily. 60 tablet 3  . glipiZIDE (GLUCOTROL XL) 2.5 MG 24 hr tablet Take 5 mg by mouth daily with breakfast.     . JANUVIA 100 MG tablet 100 mg daily. morning    . levocetirizine (XYZAL) 5 MG tablet Take 5 mg by mouth at bedtime.     Marland Kitchen levothyroxine (SYNTHROID, LEVOTHROID) 150 MCG tablet Take 1 tablet (150 mcg total) by mouth daily before breakfast. 30 tablet 0  . magnesium gluconate (MAGONATE) 500 MG tablet Take 500 mg by mouth daily.      Marland Kitchen MAGNESIUM MALATE PO Take 500 mg by mouth daily.    . Melatonin 3 MG TABS Take 3 mg by mouth at bedtime.     . metoprolol tartrate (LOPRESSOR) 25 MG tablet Take 75 mg by mouth 2 (two) times daily.    . Multiple Vitamin (MULTIVITAMIN) tablet Take 1 tablet by mouth daily.      . Omega-3 Fatty Acids (FISH OIL) 1000 MG CAPS Take 1 capsule by mouth daily.    . pantoprazole  (PROTONIX) 40 MG tablet Take 1 tablet (40 mg total) by mouth daily. 30 tablet 0   No current facility-administered medications for this visit.    Allergies:  Clindamycin; Gluten meal; Hydrocodone; Penicillins; Procaine; Sulfonamide derivatives; Tape; Wheat bran; Cephalexin; and Mold extract [trichophyton]   Social History: The patient  reports that he has never smoked. He has never used smokeless tobacco. He reports that he does not drink alcohol or use drugs.   ROS:  Please see the history of present illness. Otherwise, complete review of systems is positive for difficulty word finding intermittent weakness.  All other systems are reviewed and negative.   Physical Exam: VS:  BP (!) 142/68 (BP Location: Right Arm)   Pulse 74   Ht 5\' 10"  (1.778 m)   Wt 224 lb (101.6 kg)   SpO2 97%   BMI 32.14 kg/m , BMI Body mass index is 32.14 kg/m.  Wt Readings from Last 3 Encounters:  07/05/18 224 lb (101.6 kg)  03/02/18 226 lb 3.2 oz (102.6 kg)  12/01/17 230 lb 12.8 oz (104.7 kg)    General: Elderly male using a walker. HEENT: Conjunctiva and lids normal, oropharynx clear. Neck: Supple, no elevated JVP or carotid bruits, no thyromegaly. Lungs: Clear to auscultation, nonlabored breathing at rest. Cardiac: Regular rate and rhythm, no S3, 2/6 systolic murmur. Abdomen: Soft, nontender, bowel sounds present. Extremities: Mild ankle edema, distal pulses 2+. Skin: Warm and dry. Musculoskeletal: No kyphosis. Neuropsychiatric: Alert and oriented x3, affect grossly appropriate.  ECG: I personally reviewed the tracing from 03/02/2018 which showed sinus rhythm with right bundle branch block.  Recent Labwork: 10/07/2017: Magnesium 1.8; TSH 10.604 10/08/2017: ALT 33; AST 37; BUN 20; Creatinine, Ser 1.21; Hemoglobin 12.6; Platelets 309; Potassium 3.5; Sodium 137     Component Value Date/Time   CHOL 160 10/03/2017 0833   TRIG 164 (H) 10/03/2017 0833   HDL 37 (L) 10/03/2017 0833   CHOLHDL 4.3  10/03/2017 0833   VLDL 33 10/03/2017 0833   LDLCALC 90 10/03/2017 0833    Other Studies Reviewed Today:  Echocardiogram 10/03/2017: Study Conclusions  - Left ventricle: The cavity size was normal. Wall thickness was   increased in a pattern of mild LVH. Systolic function was normal.   The estimated ejection fraction was in the range of 60% to 65%. - Aortic valve: AV is thickened, calcified Difficult to see well   Peak  and mean gradients through the valve are 35 and 19 mm Hg   respectively consistent with mild AS. There was mild   regurgitation.  Carotid Dopplers 10/05/2017: Final Interpretation: Right Carotid: The extracranial vessels were near-normal with only minimal wall        thickening or plaque.  Left Carotid: The extracranial vessels were near-normal with only minimal wall       thickening or plaque.  Vertebrals: Both vertebral arteries were patent with antegrade flow. Subclavians: Normal flow hemodynamics were seen in bilateral subclavian       arteries.  Assessment and Plan:  1.  Paroxysmal atrial fibrillation with chads Vascor 5.  He is doing well without palpitations and heart rate is regular today.  I reviewed his echocardiogram and last ECG from within the last 6 months.  Plan to continue Eliquis and Lopressor.  Follow-up lab work with Dr. Nevada Crane as scheduled.  2.  History of stroke, no significant obstructive ICA disease by carotid Dopplers.  Continue Eliquis.  3.  Type 2 diabetes mellitus, on Januvia and Glucotrol XL.  Keep follow-up with Dr. Nevada Crane.  4.  Mild aortic stenosis, asymptomatic.  Current medicines were reviewed with the patient today.  Disposition: Follow-up in 6 months.  Signed, Satira Sark, MD, [facility] 07/05/2018 3:16 PM    [facility] [address] [place], [place] [address] Phone: [phone]; Fax: [phone]

## 2018-07-07 DIAGNOSIS — R05 Cough: Secondary | ICD-10-CM | POA: Diagnosis not present

## 2018-07-07 DIAGNOSIS — K900 Celiac disease: Secondary | ICD-10-CM | POA: Diagnosis not present

## 2018-07-07 DIAGNOSIS — E782 Mixed hyperlipidemia: Secondary | ICD-10-CM | POA: Diagnosis not present

## 2018-07-07 DIAGNOSIS — I482 Chronic atrial fibrillation: Secondary | ICD-10-CM | POA: Diagnosis not present

## 2018-07-07 DIAGNOSIS — R5383 Other fatigue: Secondary | ICD-10-CM | POA: Diagnosis not present

## 2018-07-07 DIAGNOSIS — E1140 Type 2 diabetes mellitus with diabetic neuropathy, unspecified: Secondary | ICD-10-CM | POA: Diagnosis not present

## 2018-07-07 DIAGNOSIS — I63412 Cerebral infarction due to embolism of left middle cerebral artery: Secondary | ICD-10-CM | POA: Diagnosis not present

## 2018-07-07 DIAGNOSIS — I5031 Acute diastolic (congestive) heart failure: Secondary | ICD-10-CM | POA: Diagnosis not present

## 2018-07-07 DIAGNOSIS — E875 Hyperkalemia: Secondary | ICD-10-CM | POA: Diagnosis not present

## 2018-07-07 DIAGNOSIS — I10 Essential (primary) hypertension: Secondary | ICD-10-CM | POA: Diagnosis not present

## 2018-07-07 DIAGNOSIS — D649 Anemia, unspecified: Secondary | ICD-10-CM | POA: Diagnosis not present

## 2018-07-07 DIAGNOSIS — R944 Abnormal results of kidney function studies: Secondary | ICD-10-CM | POA: Diagnosis not present

## 2018-07-13 DIAGNOSIS — E1142 Type 2 diabetes mellitus with diabetic polyneuropathy: Secondary | ICD-10-CM | POA: Diagnosis not present

## 2018-07-13 DIAGNOSIS — B351 Tinea unguium: Secondary | ICD-10-CM | POA: Diagnosis not present

## 2018-07-20 ENCOUNTER — Encounter (HOSPITAL_COMMUNITY): Payer: Self-pay | Admitting: Physical Therapy

## 2018-08-04 ENCOUNTER — Other Ambulatory Visit: Payer: Self-pay | Admitting: Diagnostic Neuroimaging

## 2018-08-04 NOTE — Telephone Encounter (Signed)
Refilled x 1 month with note to pharmacy: future refills through PCP.

## 2018-08-06 DIAGNOSIS — I10 Essential (primary) hypertension: Secondary | ICD-10-CM | POA: Diagnosis not present

## 2018-08-06 DIAGNOSIS — E782 Mixed hyperlipidemia: Secondary | ICD-10-CM | POA: Diagnosis not present

## 2018-08-12 DIAGNOSIS — E119 Type 2 diabetes mellitus without complications: Secondary | ICD-10-CM | POA: Diagnosis not present

## 2018-08-31 DIAGNOSIS — Z08 Encounter for follow-up examination after completed treatment for malignant neoplasm: Secondary | ICD-10-CM | POA: Diagnosis not present

## 2018-08-31 DIAGNOSIS — Z85828 Personal history of other malignant neoplasm of skin: Secondary | ICD-10-CM | POA: Diagnosis not present

## 2018-08-31 DIAGNOSIS — Z85820 Personal history of malignant melanoma of skin: Secondary | ICD-10-CM | POA: Diagnosis not present

## 2018-09-03 DIAGNOSIS — E1140 Type 2 diabetes mellitus with diabetic neuropathy, unspecified: Secondary | ICD-10-CM | POA: Diagnosis not present

## 2018-09-03 DIAGNOSIS — E782 Mixed hyperlipidemia: Secondary | ICD-10-CM | POA: Diagnosis not present

## 2018-09-03 DIAGNOSIS — I480 Paroxysmal atrial fibrillation: Secondary | ICD-10-CM | POA: Diagnosis not present

## 2018-09-03 DIAGNOSIS — I10 Essential (primary) hypertension: Secondary | ICD-10-CM | POA: Diagnosis not present

## 2018-09-14 ENCOUNTER — Other Ambulatory Visit: Payer: Self-pay

## 2018-09-14 DIAGNOSIS — E119 Type 2 diabetes mellitus without complications: Secondary | ICD-10-CM | POA: Diagnosis not present

## 2018-09-14 NOTE — Patient Outreach (Signed)
[facility]) Care Management  09/14/2018  [name] June 22, 1929 [id number]   Telephone Screen  Referral Date:09/14/18 Referral Source: Nurse Call Center Referral Reason: "09/13/18-10:18pm- caller states her husband took his blood pressure about an hour ago and it was 195/58 with a pulse of 60, this is high for him, pt has A-fib, patient is having no symptoms" Insurance: H[employer]   Outreach attempt #1 to patient. Spoke with spouse(DPR on file). She requested call be completed with her as patient does not like tot alk on the phone due to a stroke a few years ago. Spouse voices that patient is doing well. She voices that they rechecked patient's BP last night shortly after speaking with nurse at call center and BP was already back down to normal range for patient. They have checked Bop this am and voices BP within normal range. Patient is having no symptoms or issues at present. They are aware of when and how to seek medical attention if needed. Spouse denies needing any further services or assistance at this time. They are aware that they can call nurse line 24/7 for any future needs or concerns. Spouse voiced understanding and was appreciative of call.     Plan: RN CM will close case as no further interventions needed at this time.    Enzo Montgomery, RN,BSN,CCM [place] Management Telephonic Care Management Coordinator Direct Phone: [phone] Toll Free: [phone] Fax: [phone]

## 2018-09-15 DIAGNOSIS — Z6832 Body mass index (BMI) 32.0-32.9, adult: Secondary | ICD-10-CM | POA: Diagnosis not present

## 2018-09-15 DIAGNOSIS — E669 Obesity, unspecified: Secondary | ICD-10-CM | POA: Diagnosis not present

## 2018-09-15 DIAGNOSIS — I10 Essential (primary) hypertension: Secondary | ICD-10-CM | POA: Diagnosis not present

## 2018-09-15 DIAGNOSIS — I350 Nonrheumatic aortic (valve) stenosis: Secondary | ICD-10-CM | POA: Diagnosis not present

## 2018-09-15 DIAGNOSIS — I480 Paroxysmal atrial fibrillation: Secondary | ICD-10-CM | POA: Diagnosis not present

## 2018-09-18 DIAGNOSIS — I10 Essential (primary) hypertension: Secondary | ICD-10-CM | POA: Diagnosis not present

## 2018-09-18 DIAGNOSIS — E782 Mixed hyperlipidemia: Secondary | ICD-10-CM | POA: Diagnosis not present

## 2018-09-21 DIAGNOSIS — E1142 Type 2 diabetes mellitus with diabetic polyneuropathy: Secondary | ICD-10-CM | POA: Diagnosis not present

## 2018-09-21 DIAGNOSIS — B351 Tinea unguium: Secondary | ICD-10-CM | POA: Diagnosis not present

## 2018-09-30 DIAGNOSIS — E119 Type 2 diabetes mellitus without complications: Secondary | ICD-10-CM | POA: Diagnosis not present

## 2018-10-14 DIAGNOSIS — E782 Mixed hyperlipidemia: Secondary | ICD-10-CM | POA: Diagnosis not present

## 2018-10-14 DIAGNOSIS — E1140 Type 2 diabetes mellitus with diabetic neuropathy, unspecified: Secondary | ICD-10-CM | POA: Diagnosis not present

## 2018-10-14 DIAGNOSIS — E1142 Type 2 diabetes mellitus with diabetic polyneuropathy: Secondary | ICD-10-CM | POA: Diagnosis not present

## 2018-10-14 DIAGNOSIS — E669 Obesity, unspecified: Secondary | ICD-10-CM | POA: Diagnosis not present

## 2018-10-14 DIAGNOSIS — D649 Anemia, unspecified: Secondary | ICD-10-CM | POA: Diagnosis not present

## 2018-10-14 DIAGNOSIS — D509 Iron deficiency anemia, unspecified: Secondary | ICD-10-CM | POA: Diagnosis not present

## 2018-10-19 DIAGNOSIS — K900 Celiac disease: Secondary | ICD-10-CM | POA: Diagnosis not present

## 2018-10-19 DIAGNOSIS — E782 Mixed hyperlipidemia: Secondary | ICD-10-CM | POA: Diagnosis not present

## 2018-10-19 DIAGNOSIS — D649 Anemia, unspecified: Secondary | ICD-10-CM | POA: Diagnosis not present

## 2018-10-19 DIAGNOSIS — E669 Obesity, unspecified: Secondary | ICD-10-CM | POA: Diagnosis not present

## 2018-10-19 DIAGNOSIS — G9009 Other idiopathic peripheral autonomic neuropathy: Secondary | ICD-10-CM | POA: Diagnosis not present

## 2018-10-19 DIAGNOSIS — E1122 Type 2 diabetes mellitus with diabetic chronic kidney disease: Secondary | ICD-10-CM | POA: Diagnosis not present

## 2018-10-19 DIAGNOSIS — N183 Chronic kidney disease, stage 3 (moderate): Secondary | ICD-10-CM | POA: Diagnosis not present

## 2018-10-19 DIAGNOSIS — E875 Hyperkalemia: Secondary | ICD-10-CM | POA: Diagnosis not present

## 2018-10-19 DIAGNOSIS — I5031 Acute diastolic (congestive) heart failure: Secondary | ICD-10-CM | POA: Diagnosis not present

## 2018-10-19 DIAGNOSIS — I10 Essential (primary) hypertension: Secondary | ICD-10-CM | POA: Diagnosis not present

## 2018-10-19 DIAGNOSIS — I482 Chronic atrial fibrillation, unspecified: Secondary | ICD-10-CM | POA: Diagnosis not present

## 2018-10-19 DIAGNOSIS — I69351 Hemiplegia and hemiparesis following cerebral infarction affecting right dominant side: Secondary | ICD-10-CM | POA: Diagnosis not present

## 2018-11-04 DIAGNOSIS — I482 Chronic atrial fibrillation, unspecified: Secondary | ICD-10-CM | POA: Diagnosis not present

## 2018-11-04 DIAGNOSIS — E782 Mixed hyperlipidemia: Secondary | ICD-10-CM | POA: Diagnosis not present

## 2018-11-04 DIAGNOSIS — K900 Celiac disease: Secondary | ICD-10-CM | POA: Diagnosis not present

## 2018-11-04 DIAGNOSIS — N183 Chronic kidney disease, stage 3 (moderate): Secondary | ICD-10-CM | POA: Diagnosis not present

## 2018-11-04 DIAGNOSIS — E1122 Type 2 diabetes mellitus with diabetic chronic kidney disease: Secondary | ICD-10-CM | POA: Diagnosis not present

## 2018-11-04 DIAGNOSIS — E875 Hyperkalemia: Secondary | ICD-10-CM | POA: Diagnosis not present

## 2018-11-04 DIAGNOSIS — I10 Essential (primary) hypertension: Secondary | ICD-10-CM | POA: Diagnosis not present

## 2018-11-19 DIAGNOSIS — E119 Type 2 diabetes mellitus without complications: Secondary | ICD-10-CM | POA: Diagnosis not present

## 2018-11-30 DIAGNOSIS — B351 Tinea unguium: Secondary | ICD-10-CM | POA: Diagnosis not present

## 2018-11-30 DIAGNOSIS — E1142 Type 2 diabetes mellitus with diabetic polyneuropathy: Secondary | ICD-10-CM | POA: Diagnosis not present

## 2018-12-24 DIAGNOSIS — E875 Hyperkalemia: Secondary | ICD-10-CM | POA: Diagnosis not present

## 2018-12-24 DIAGNOSIS — I482 Chronic atrial fibrillation, unspecified: Secondary | ICD-10-CM | POA: Diagnosis not present

## 2018-12-24 DIAGNOSIS — N183 Chronic kidney disease, stage 3 (moderate): Secondary | ICD-10-CM | POA: Diagnosis not present

## 2018-12-24 DIAGNOSIS — E1122 Type 2 diabetes mellitus with diabetic chronic kidney disease: Secondary | ICD-10-CM | POA: Diagnosis not present

## 2018-12-24 DIAGNOSIS — K900 Celiac disease: Secondary | ICD-10-CM | POA: Diagnosis not present

## 2018-12-24 DIAGNOSIS — I10 Essential (primary) hypertension: Secondary | ICD-10-CM | POA: Diagnosis not present

## 2018-12-24 DIAGNOSIS — E782 Mixed hyperlipidemia: Secondary | ICD-10-CM | POA: Diagnosis not present

## 2019-01-04 ENCOUNTER — Ambulatory Visit: Payer: PPO | Admitting: Cardiology

## 2019-01-05 DIAGNOSIS — M542 Cervicalgia: Secondary | ICD-10-CM | POA: Diagnosis not present

## 2019-01-05 DIAGNOSIS — M9901 Segmental and somatic dysfunction of cervical region: Secondary | ICD-10-CM | POA: Diagnosis not present

## 2019-01-07 DIAGNOSIS — M9901 Segmental and somatic dysfunction of cervical region: Secondary | ICD-10-CM | POA: Diagnosis not present

## 2019-01-07 DIAGNOSIS — M542 Cervicalgia: Secondary | ICD-10-CM | POA: Diagnosis not present

## 2019-01-07 DIAGNOSIS — E119 Type 2 diabetes mellitus without complications: Secondary | ICD-10-CM | POA: Diagnosis not present

## 2019-01-10 DIAGNOSIS — M542 Cervicalgia: Secondary | ICD-10-CM | POA: Diagnosis not present

## 2019-01-10 DIAGNOSIS — M9901 Segmental and somatic dysfunction of cervical region: Secondary | ICD-10-CM | POA: Diagnosis not present

## 2019-01-12 DIAGNOSIS — M9901 Segmental and somatic dysfunction of cervical region: Secondary | ICD-10-CM | POA: Diagnosis not present

## 2019-01-12 DIAGNOSIS — M542 Cervicalgia: Secondary | ICD-10-CM | POA: Diagnosis not present

## 2019-01-17 DIAGNOSIS — E785 Hyperlipidemia, unspecified: Secondary | ICD-10-CM | POA: Diagnosis not present

## 2019-01-17 DIAGNOSIS — I2510 Atherosclerotic heart disease of native coronary artery without angina pectoris: Secondary | ICD-10-CM | POA: Diagnosis not present

## 2019-01-17 DIAGNOSIS — R0789 Other chest pain: Secondary | ICD-10-CM | POA: Diagnosis not present

## 2019-01-17 DIAGNOSIS — I679 Cerebrovascular disease, unspecified: Secondary | ICD-10-CM | POA: Diagnosis not present

## 2019-01-17 DIAGNOSIS — G43909 Migraine, unspecified, not intractable, without status migrainosus: Secondary | ICD-10-CM | POA: Diagnosis not present

## 2019-01-17 DIAGNOSIS — M542 Cervicalgia: Secondary | ICD-10-CM | POA: Diagnosis not present

## 2019-01-17 DIAGNOSIS — Z955 Presence of coronary angioplasty implant and graft: Secondary | ICD-10-CM | POA: Diagnosis not present

## 2019-01-17 DIAGNOSIS — R0602 Shortness of breath: Secondary | ICD-10-CM | POA: Diagnosis not present

## 2019-01-17 DIAGNOSIS — I10 Essential (primary) hypertension: Secondary | ICD-10-CM | POA: Diagnosis not present

## 2019-01-17 DIAGNOSIS — H538 Other visual disturbances: Secondary | ICD-10-CM | POA: Diagnosis not present

## 2019-01-17 DIAGNOSIS — M9901 Segmental and somatic dysfunction of cervical region: Secondary | ICD-10-CM | POA: Diagnosis not present

## 2019-01-17 DIAGNOSIS — R413 Other amnesia: Secondary | ICD-10-CM | POA: Diagnosis not present

## 2019-01-19 DIAGNOSIS — G8929 Other chronic pain: Secondary | ICD-10-CM | POA: Diagnosis not present

## 2019-01-19 DIAGNOSIS — M25561 Pain in right knee: Secondary | ICD-10-CM | POA: Diagnosis not present

## 2019-01-19 DIAGNOSIS — Z0001 Encounter for general adult medical examination with abnormal findings: Secondary | ICD-10-CM | POA: Diagnosis not present

## 2019-01-20 DIAGNOSIS — Z20828 Contact with and (suspected) exposure to other viral communicable diseases: Secondary | ICD-10-CM | POA: Diagnosis not present

## 2019-01-20 DIAGNOSIS — Z9889 Other specified postprocedural states: Secondary | ICD-10-CM | POA: Diagnosis not present

## 2019-01-20 DIAGNOSIS — M542 Cervicalgia: Secondary | ICD-10-CM | POA: Diagnosis not present

## 2019-01-20 DIAGNOSIS — E1122 Type 2 diabetes mellitus with diabetic chronic kidney disease: Secondary | ICD-10-CM | POA: Diagnosis not present

## 2019-01-20 DIAGNOSIS — I482 Chronic atrial fibrillation, unspecified: Secondary | ICD-10-CM | POA: Diagnosis not present

## 2019-01-20 DIAGNOSIS — R5383 Other fatigue: Secondary | ICD-10-CM | POA: Diagnosis not present

## 2019-01-20 DIAGNOSIS — N184 Chronic kidney disease, stage 4 (severe): Secondary | ICD-10-CM | POA: Diagnosis not present

## 2019-01-20 DIAGNOSIS — E1140 Type 2 diabetes mellitus with diabetic neuropathy, unspecified: Secondary | ICD-10-CM | POA: Diagnosis not present

## 2019-01-20 DIAGNOSIS — D509 Iron deficiency anemia, unspecified: Secondary | ICD-10-CM | POA: Diagnosis not present

## 2019-01-20 DIAGNOSIS — M1712 Unilateral primary osteoarthritis, left knee: Secondary | ICD-10-CM | POA: Diagnosis not present

## 2019-01-20 DIAGNOSIS — M48062 Spinal stenosis, lumbar region with neurogenic claudication: Secondary | ICD-10-CM | POA: Diagnosis not present

## 2019-01-20 DIAGNOSIS — E11649 Type 2 diabetes mellitus with hypoglycemia without coma: Secondary | ICD-10-CM | POA: Diagnosis not present

## 2019-01-20 DIAGNOSIS — M9901 Segmental and somatic dysfunction of cervical region: Secondary | ICD-10-CM | POA: Diagnosis not present

## 2019-01-20 DIAGNOSIS — I10 Essential (primary) hypertension: Secondary | ICD-10-CM | POA: Diagnosis not present

## 2019-01-20 DIAGNOSIS — N183 Chronic kidney disease, stage 3 (moderate): Secondary | ICD-10-CM | POA: Diagnosis not present

## 2019-01-20 DIAGNOSIS — E782 Mixed hyperlipidemia: Secondary | ICD-10-CM | POA: Diagnosis not present

## 2019-01-20 DIAGNOSIS — Z6833 Body mass index (BMI) 33.0-33.9, adult: Secondary | ICD-10-CM | POA: Diagnosis not present

## 2019-01-24 DIAGNOSIS — M9901 Segmental and somatic dysfunction of cervical region: Secondary | ICD-10-CM | POA: Diagnosis not present

## 2019-01-24 DIAGNOSIS — M542 Cervicalgia: Secondary | ICD-10-CM | POA: Diagnosis not present

## 2019-01-25 DIAGNOSIS — N183 Chronic kidney disease, stage 3 (moderate): Secondary | ICD-10-CM | POA: Diagnosis not present

## 2019-01-25 DIAGNOSIS — K900 Celiac disease: Secondary | ICD-10-CM | POA: Diagnosis not present

## 2019-01-25 DIAGNOSIS — I10 Essential (primary) hypertension: Secondary | ICD-10-CM | POA: Diagnosis not present

## 2019-01-25 DIAGNOSIS — E1122 Type 2 diabetes mellitus with diabetic chronic kidney disease: Secondary | ICD-10-CM | POA: Diagnosis not present

## 2019-01-25 DIAGNOSIS — I639 Cerebral infarction, unspecified: Secondary | ICD-10-CM | POA: Diagnosis not present

## 2019-01-25 DIAGNOSIS — E1140 Type 2 diabetes mellitus with diabetic neuropathy, unspecified: Secondary | ICD-10-CM | POA: Diagnosis not present

## 2019-01-25 DIAGNOSIS — D631 Anemia in chronic kidney disease: Secondary | ICD-10-CM | POA: Diagnosis not present

## 2019-01-25 DIAGNOSIS — I4891 Unspecified atrial fibrillation: Secondary | ICD-10-CM | POA: Diagnosis not present

## 2019-01-25 DIAGNOSIS — E782 Mixed hyperlipidemia: Secondary | ICD-10-CM | POA: Diagnosis not present

## 2019-01-25 DIAGNOSIS — E875 Hyperkalemia: Secondary | ICD-10-CM | POA: Diagnosis not present

## 2019-01-25 DIAGNOSIS — I69351 Hemiplegia and hemiparesis following cerebral infarction affecting right dominant side: Secondary | ICD-10-CM | POA: Diagnosis not present

## 2019-01-25 DIAGNOSIS — I5031 Acute diastolic (congestive) heart failure: Secondary | ICD-10-CM | POA: Diagnosis not present

## 2019-02-01 DIAGNOSIS — L97921 Non-pressure chronic ulcer of unspecified part of left lower leg limited to breakdown of skin: Secondary | ICD-10-CM | POA: Diagnosis not present

## 2019-02-01 DIAGNOSIS — E669 Obesity, unspecified: Secondary | ICD-10-CM | POA: Diagnosis not present

## 2019-02-01 DIAGNOSIS — D649 Anemia, unspecified: Secondary | ICD-10-CM | POA: Diagnosis not present

## 2019-02-01 DIAGNOSIS — L0213 Carbuncle of neck: Secondary | ICD-10-CM | POA: Diagnosis not present

## 2019-02-01 DIAGNOSIS — I69351 Hemiplegia and hemiparesis following cerebral infarction affecting right dominant side: Secondary | ICD-10-CM | POA: Diagnosis not present

## 2019-02-01 DIAGNOSIS — I63412 Cerebral infarction due to embolism of left middle cerebral artery: Secondary | ICD-10-CM | POA: Diagnosis not present

## 2019-02-01 DIAGNOSIS — L97811 Non-pressure chronic ulcer of other part of right lower leg limited to breakdown of skin: Secondary | ICD-10-CM | POA: Diagnosis not present

## 2019-02-01 DIAGNOSIS — Z0001 Encounter for general adult medical examination with abnormal findings: Secondary | ICD-10-CM | POA: Diagnosis not present

## 2019-02-01 DIAGNOSIS — M21379 Foot drop, unspecified foot: Secondary | ICD-10-CM | POA: Diagnosis not present

## 2019-02-01 DIAGNOSIS — Z6832 Body mass index (BMI) 32.0-32.9, adult: Secondary | ICD-10-CM | POA: Diagnosis not present

## 2019-02-01 DIAGNOSIS — I350 Nonrheumatic aortic (valve) stenosis: Secondary | ICD-10-CM | POA: Diagnosis not present

## 2019-02-01 DIAGNOSIS — I480 Paroxysmal atrial fibrillation: Secondary | ICD-10-CM | POA: Diagnosis not present

## 2019-02-11 DIAGNOSIS — E875 Hyperkalemia: Secondary | ICD-10-CM | POA: Diagnosis not present

## 2019-02-17 ENCOUNTER — Telehealth: Payer: Self-pay | Admitting: Cardiology

## 2019-02-17 NOTE — Telephone Encounter (Signed)
Returned pt call. No answer, no voicemail. I will try again later.

## 2019-02-17 NOTE — Telephone Encounter (Signed)
Thank you for the update.  I have actually only seen him on one occasion in September 2019, he is a former patient of Dr. Gwenlyn Found.  Please get the recent lab work and notes from Dr. Juel Burrow office so that I can better understand what has been going on in terms of his electrolyte abnormalities.  I would suggest that we schedule an echocardiogram soon to follow-up LVEF, last study was in 2018.  If they would like to have an office visit after the echocardiogram with me, this would have to occur on a day that I am in [place], and during an open slot.  Please do not overbook this with a full virtual schedule.

## 2019-02-17 NOTE — Telephone Encounter (Signed)
Returned pt's wifes call. Pt came to phone and asked me to speak to his wife as he has trouble with his speech from a previous stroke. Pt's wife states that the pt has not been seen in an office since Jan. 2020. She stated that he has had lab work that shows py's potassium was too high. He is on a medication for that. Pt's wife noticed that his legs (backs of calf muscle) is swollen and pt stated it is painful. Pt had been taking lasix 20 mg daily until Tuesday of this week and he was increased to 40 mg daily by Dr. Juel Burrow office due to the swelling He has went from 220 lbs to 229 lbs in a 2 week period. Pt does have afib, but he does not know when his heart is beating irregular. He does not complain of any chest pain nor SOB. She states thay take his blood pressure on a regular basis and it is normal overall. She requests an office visit as they have done several virtual visits with PCP and she thinks they are not very helpful. Please advise. (I have requested recent labs and office notes from Dr. Juel Burrow office.)

## 2019-02-17 NOTE — Telephone Encounter (Signed)
Patient has been having problems lately and his wife is requesting that he be seen in office by Domenic Polite.  Wanted to speak with a nurse to about this and his issues  High Potasium and medication that is causes Edema in his legs. He has been complaining about his legs swelling and hurting. He fears he is headed back to being in heart failiure

## 2019-02-22 ENCOUNTER — Telehealth: Payer: Self-pay

## 2019-02-22 DIAGNOSIS — L821 Other seborrheic keratosis: Secondary | ICD-10-CM | POA: Diagnosis not present

## 2019-02-22 DIAGNOSIS — I5031 Acute diastolic (congestive) heart failure: Secondary | ICD-10-CM

## 2019-02-22 DIAGNOSIS — D225 Melanocytic nevi of trunk: Secondary | ICD-10-CM | POA: Diagnosis not present

## 2019-02-22 DIAGNOSIS — Z1283 Encounter for screening for malignant neoplasm of skin: Secondary | ICD-10-CM | POA: Diagnosis not present

## 2019-02-22 DIAGNOSIS — Z85820 Personal history of malignant melanoma of skin: Secondary | ICD-10-CM | POA: Diagnosis not present

## 2019-02-22 DIAGNOSIS — Z08 Encounter for follow-up examination after completed treatment for malignant neoplasm: Secondary | ICD-10-CM | POA: Diagnosis not present

## 2019-02-22 DIAGNOSIS — L570 Actinic keratosis: Secondary | ICD-10-CM | POA: Diagnosis not present

## 2019-02-22 DIAGNOSIS — X32XXXD Exposure to sunlight, subsequent encounter: Secondary | ICD-10-CM | POA: Diagnosis not present

## 2019-02-22 DIAGNOSIS — L02821 Furuncle of head [any part, except face]: Secondary | ICD-10-CM | POA: Diagnosis not present

## 2019-02-22 DIAGNOSIS — B9689 Other specified bacterial agents as the cause of diseases classified elsewhere: Secondary | ICD-10-CM | POA: Diagnosis not present

## 2019-02-22 MED ORDER — APIXABAN 2.5 MG PO TABS: 2.5000 mg | ORAL_TABLET | Freq: Two times a day (BID) | ORAL | 6 refills | Status: AC

## 2019-02-22 NOTE — Telephone Encounter (Signed)
Notes recorded by Satira Sark, MD on 02/22/2019 at 2:17 PM EDT Results reviewed. Agree, his potassium levels have been high, he also has renal insufficiency with creatinine hovering around 1.5. He has been on Lasix which could affect both, but presumably the laboratory abnormalities started before the diuretic. He is not on any other obvious offending medications and may need to be referred by Dr. Nevada Crane to see a nephrologist ultimately. Based on his most recent creatinine levels I would suggest cutting his Eliquis back to 2.5 mg twice daily. He will also get a follow-up echocardiogram as already mentioned.   Echo 5/15 at 1015 at [facility]

## 2019-02-22 NOTE — Telephone Encounter (Signed)
Called pt. No answer. Left message for pt to return call.  

## 2019-02-24 ENCOUNTER — Telehealth: Payer: Self-pay | Admitting: Cardiology

## 2019-02-24 NOTE — Telephone Encounter (Signed)

## 2019-02-24 NOTE — Telephone Encounter (Signed)
Called pt. No answer. Left message for pt to return call.  

## 2019-02-25 ENCOUNTER — Other Ambulatory Visit: Payer: Self-pay

## 2019-02-25 ENCOUNTER — Ambulatory Visit (HOSPITAL_COMMUNITY)
Admission: RE | Admit: 2019-02-25 | Discharge: 2019-02-25 | Disposition: A | Discharge status: Home / Self Care | Payer: PPO | Source: Ambulatory Visit | Attending: Cardiology | Admitting: Cardiology

## 2019-02-25 DIAGNOSIS — I5031 Acute diastolic (congestive) heart failure: Secondary | ICD-10-CM | POA: Diagnosis not present

## 2019-02-25 NOTE — Progress Notes (Signed)
Virtual Visit via Telephone Note   This visit type was conducted due to national recommendations for restrictions regarding the COVID-19 Pandemic (e.g. social distancing) in an effort to limit this patient's exposure and mitigate transmission in our community.  Due to his co-morbid illnesses, this patient is at least at moderate risk for complications without adequate follow up.  This format is felt to be most appropriate for this patient at this time.  The patient did not have access to video technology/had technical difficulties with video requiring transitioning to audio format only (telephone).  All issues noted in this document were discussed and addressed.  No physical exam could be performed with this format.  Please refer to the patient's chart for his  consent to telehealth for [facility].   Date:  03/08/2019   ID:  [name], DOB [birth year], MRN [id number]  Patient Location: Home Provider Location: Home  PCP:  Celene Squibb, MD  Cardiologist:  Rozann Lesches, MD   Evaluation Performed:  Follow-Up Visit  Chief Complaint:   Cardiac follow-up  History of Present Illness:    [name] is a 85 y.o. male last seen in September 2019.  He did not have video access and we spoke by phone today.  I also spoke with his wife on the phone.  I reviewed records from Dr. Nevada Crane.  He has had trouble with hyperkalemia in the setting of renal insufficiency.  He has not been taken off telmisartan as yet, but was started on Lokelma.  Most recent potassium was 5.6.    He has had trouble with leg swelling and weeping recently. No orthopnea or PND.  Follow-up echocardiogram was obtained revealing LVEF 60 to 65% range with overall moderate aortic stenosis.  Present Lasix dose is 40 mg daily.  Eliquis was recently reduced to 2.5 mg daily in light of creatinine 1.5 and age 34.  On average systolic blood pressure has been in the 140-180 range at home.  The patient does not have symptoms  concerning for COVID-19 infection (fever, chills, cough, or new shortness of breath).    Past Medical History:  Diagnosis Date  . Allergic rhinitis   . Asthmatic bronchitis   . Celiac disease   . Cellulitis   . Diverticulosis   . Fx. left wrist 1987  . Gastric polyps   . GERD (gastroesophageal reflux disease)   . History of pneumonia   . Hypothyroidism   . Iron deficiency anemia   . Melanoma ([place])   . Neuropathy   . PAF (paroxysmal atrial fibrillation) ([place])   . Prostate cancer ([place])   . REM sleep behavior disorder   . RLS (restless legs syndrome)   . Sleep apnea   . Stroke ([place]) 09/2017  . Type 2 diabetes mellitus ([place])    Past Surgical History:  Procedure Laterality Date  . APPENDECTOMY  1978  . CARPAL TUNNEL RELEASE Left   . CATARACT EXTRACTION  03/2011   bilateral   . CHOLECYSTECTOMY  2001  . COLONOSCOPY W/ BIOPSIES  04/27/2008   diverticulosis, duodenitis  . FOOT SURGERY    . HERNIA REPAIR  1994   bilateral  . KNEE SURGERY Bilateral    x 2  . LUNG SURGERY  2001   for infection  . PROSTATECTOMY  2002  . UPPER GASTROINTESTINAL ENDOSCOPY  04/27/2008   celiac disease, gastric polyps     Current Meds  Medication Sig  . amitriptyline (ELAVIL) 50 MG tablet Take 50 mg  by mouth at bedtime.  Marland Kitchen apixaban (ELIQUIS) 2.5 MG TABS tablet Take 1 tablet (2.5 mg total) by mouth 2 (two) times daily.  . Ascorbic Acid (VITAMIN C) 1000 MG tablet Take 1,000 mg by mouth 2 (two) times daily.    Marland Kitchen atorvastatin (LIPITOR) 20 MG tablet Take 1 tablet (20 mg total) by mouth daily.  Marland Kitchen azelastine (ASTELIN) 0.1 % nasal spray Place 1 spray into both nostrils at bedtime.   . Cholecalciferol (VITAMIN D3) 1000 UNITS CAPS Take 3,000 Units by mouth 3 (three) times daily.   . diclofenac sodium (VOLTAREN) 1 % GEL Apply 2 g topically 4 (four) times daily.  . fluticasone (FLONASE) 50 MCG/ACT nasal spray Place 1 spray into both nostrils daily.   . furosemide (LASIX) 20 MG tablet Take 40 mg by mouth  daily.  Marland Kitchen glipiZIDE (GLUCOTROL XL) 2.5 MG 24 hr tablet Take 5 mg by mouth daily with breakfast.   . JANUVIA 100 MG tablet 100 mg daily. morning  . levocetirizine (XYZAL) 5 MG tablet Take 5 mg by mouth at bedtime.   Marland Kitchen levothyroxine (SYNTHROID, LEVOTHROID) 150 MCG tablet Take 1 tablet (150 mcg total) by mouth daily before breakfast.  . LOKELMA 10 g PACK packet Take 10 g by mouth daily.   Marland Kitchen MAGNESIUM MALATE PO Take 500 mg by mouth daily.  . Melatonin 3 MG TABS Take 3 mg by mouth at bedtime.   . metoprolol tartrate (LOPRESSOR) 25 MG tablet Take 75 mg by mouth 2 (two) times daily.  . Multiple Vitamin (MULTIVITAMIN) tablet Take 1 tablet by mouth daily.    . Omega-3 Fatty Acids (FISH OIL) 1000 MG CAPS Take 1 capsule by mouth daily.  . pantoprazole (PROTONIX) 40 MG tablet Take 1 tablet (40 mg total) by mouth daily.  . [DISCONTINUED] magnesium gluconate (MAGONATE) 500 MG tablet Take 500 mg by mouth daily.    . [DISCONTINUED] telmisartan (MICARDIS) 40 MG tablet Take 40 mg by mouth daily.      Allergies:   Clindamycin; Gluten meal; Hydrocodone; Penicillins; Procaine; Sulfonamide derivatives; Tape; Wheat bran; Cephalexin; and Mold extract [trichophyton]   Social History   Tobacco Use  . Smoking status: Never Smoker  . Smokeless tobacco: Never Used  Substance Use Topics  . Alcohol use: No  . Drug use: No     Family Hx: The patient's family history includes Breast cancer in his mother; Colon cancer in his unknown relative; Heart disease in his father; Kidney failure in his father; Rheumatic fever in his father.  ROS:   Please see the history of present illness.    All other systems reviewed and are negative.   Prior CV studies:   The following studies were reviewed today:  Echocardiogram 02/25/2019:  1. The left ventricle has normal systolic function with an ejection fraction of 60-65%. The cavity size was normal. There is mild concentric left ventricular hypertrophy. Left ventricular  diastolic Doppler parameters are consistent with impaired  relaxation. Indeterminate filling pressures No evidence of left ventricular regional wall motion abnormalities.  2. The right ventricle has normal systolic function. The cavity was normal. There is no increase in right ventricular wall thickness.  3. Left atrial size was mildly dilated.  4. The mitral valve is grossly normal.  5. The tricuspid valve is grossly normal.  6. The aortic valve is tricuspid. Moderate thickening of the aortic valve. Moderate calcification of the aortic valve. Aortic valve regurgitation is trivial by color flow Doppler. Moderate stenosis of the aortic valve.  7. Moderate aortic annular calcification.  8. The aortic root is normal in size and structure.  Labs/Other Tests and Data Reviewed:    EKG:  An ECG dated 03/02/2018 was personally reviewed today and demonstrated:  Sinus rhythm with right bundle branch block.  Recent Labs:  April 2020: Hemoglobin A1c 7.2%, TSH 0.794, magnesium 2.3, hemoglobin 11.4, platelets 312, cholesterol 119, triglycerides 130, HDL 41, LDL 52 May 2020: BUN 33, creatinine 1.51, GFR 41, potassium 5.6  Wt Readings from Last 3 Encounters:  03/08/19 224 lb (101.6 kg)  07/05/18 224 lb (101.6 kg)  03/02/18 226 lb 3.2 oz (102.6 kg)     Objective:    Vital Signs:  BP (!) 178/57   Pulse (!) 57   Ht 5\' 11"  (1.803 m)   Wt 224 lb (101.6 kg)   BMI 31.24 kg/m    Patient spoke in full sentences on the phone, not short of breath. No audible wheezing. Normal speech pattern.  ASSESSMENT & PLAN:    1.  Atrial fibrillation with prior history of stroke. CHADSVASC score is 5.  Plan is to continue Eliquis at 2.5 mg twice daily for now along with Lopressor.  No active palpitations.  2.  Renal insufficiency, likely CKD stage III with recent hyperkalemia.  I have recommended that he stop telmisartan, continue on Lokelma for now but repeat BMET in 2 weeks.  If the situation does not improve, he  will proceed with referral to nephrology.  3.  Essential hypertension, starting hydralazine 25 mg twice daily in the absence of telmisartan.  Continue to track blood pressure at home.  4.  Aortic stenosis, progressed to moderate range by recent echocardiogram.  LVEF remains normal.  COVID-19 Education: The signs and symptoms of COVID-19 were discussed with the patient and how to seek care for testing (follow up with PCP or arrange E-visit).  The importance of social distancing was discussed today.  Time:   Today, I have spent 19 minutes with the patient with telehealth technology discussing the above problems.     Medication Adjustments/Labs and Tests Ordered: Current medicines are reviewed at length with the patient today.  Concerns regarding medicines are outlined above.   Tests Ordered: No orders of the defined types were placed in this encounter.   Medication Changes: Meds ordered this encounter  Medications  . hydrALAZINE (APRESOLINE) 25 MG tablet    Sig: Take 1 tablet (25 mg total) by mouth 2 (two) times a day.    Dispense:  180 tablet    Refill:  3    5/26 telmisartan d/c'd    Disposition:  Follow up lab work and determine follow-up.  Signed, Rozann Lesches, MD  03/08/2019 2:01 PM    [facility]

## 2019-02-25 NOTE — Progress Notes (Signed)
*  PRELIMINARY RESULTS* Echocardiogram 2D Echocardiogram has been performed.  [name] 02/25/2019, 11:30 AM

## 2019-02-28 DIAGNOSIS — L97921 Non-pressure chronic ulcer of unspecified part of left lower leg limited to breakdown of skin: Secondary | ICD-10-CM | POA: Diagnosis not present

## 2019-02-28 DIAGNOSIS — I480 Paroxysmal atrial fibrillation: Secondary | ICD-10-CM | POA: Diagnosis not present

## 2019-02-28 DIAGNOSIS — E669 Obesity, unspecified: Secondary | ICD-10-CM | POA: Diagnosis not present

## 2019-02-28 DIAGNOSIS — I63412 Cerebral infarction due to embolism of left middle cerebral artery: Secondary | ICD-10-CM | POA: Diagnosis not present

## 2019-02-28 DIAGNOSIS — L97811 Non-pressure chronic ulcer of other part of right lower leg limited to breakdown of skin: Secondary | ICD-10-CM | POA: Diagnosis not present

## 2019-02-28 DIAGNOSIS — L0213 Carbuncle of neck: Secondary | ICD-10-CM | POA: Diagnosis not present

## 2019-02-28 DIAGNOSIS — I350 Nonrheumatic aortic (valve) stenosis: Secondary | ICD-10-CM | POA: Diagnosis not present

## 2019-02-28 DIAGNOSIS — D649 Anemia, unspecified: Secondary | ICD-10-CM | POA: Diagnosis not present

## 2019-02-28 DIAGNOSIS — I4891 Unspecified atrial fibrillation: Secondary | ICD-10-CM | POA: Diagnosis not present

## 2019-02-28 DIAGNOSIS — Z6832 Body mass index (BMI) 32.0-32.9, adult: Secondary | ICD-10-CM | POA: Diagnosis not present

## 2019-02-28 DIAGNOSIS — Z0001 Encounter for general adult medical examination with abnormal findings: Secondary | ICD-10-CM | POA: Diagnosis not present

## 2019-02-28 DIAGNOSIS — D631 Anemia in chronic kidney disease: Secondary | ICD-10-CM | POA: Diagnosis not present

## 2019-03-01 DIAGNOSIS — E119 Type 2 diabetes mellitus without complications: Secondary | ICD-10-CM | POA: Diagnosis not present

## 2019-03-03 DIAGNOSIS — I4819 Other persistent atrial fibrillation: Secondary | ICD-10-CM | POA: Diagnosis not present

## 2019-03-03 DIAGNOSIS — I5032 Chronic diastolic (congestive) heart failure: Secondary | ICD-10-CM | POA: Diagnosis not present

## 2019-03-03 DIAGNOSIS — E875 Hyperkalemia: Secondary | ICD-10-CM | POA: Diagnosis not present

## 2019-03-03 DIAGNOSIS — N183 Chronic kidney disease, stage 3 (moderate): Secondary | ICD-10-CM | POA: Diagnosis not present

## 2019-03-03 DIAGNOSIS — I129 Hypertensive chronic kidney disease with stage 1 through stage 4 chronic kidney disease, or unspecified chronic kidney disease: Secondary | ICD-10-CM | POA: Diagnosis not present

## 2019-03-03 DIAGNOSIS — R600 Localized edema: Secondary | ICD-10-CM | POA: Diagnosis not present

## 2019-03-08 ENCOUNTER — Encounter: Payer: Self-pay | Admitting: Cardiology

## 2019-03-08 ENCOUNTER — Telehealth (INDEPENDENT_AMBULATORY_CARE_PROVIDER_SITE_OTHER): Payer: PPO | Admitting: Cardiology

## 2019-03-08 VITALS — BP 178/57 | HR 57 | Ht 71.0 in | Wt 224.0 lb

## 2019-03-08 DIAGNOSIS — N183 Chronic kidney disease, stage 3 unspecified: Secondary | ICD-10-CM

## 2019-03-08 DIAGNOSIS — I480 Paroxysmal atrial fibrillation: Secondary | ICD-10-CM | POA: Diagnosis not present

## 2019-03-08 DIAGNOSIS — Z8673 Personal history of transient ischemic attack (TIA), and cerebral infarction without residual deficits: Secondary | ICD-10-CM

## 2019-03-08 DIAGNOSIS — I350 Nonrheumatic aortic (valve) stenosis: Secondary | ICD-10-CM

## 2019-03-08 DIAGNOSIS — I10 Essential (primary) hypertension: Secondary | ICD-10-CM | POA: Diagnosis not present

## 2019-03-08 DIAGNOSIS — E875 Hyperkalemia: Secondary | ICD-10-CM

## 2019-03-08 DIAGNOSIS — I482 Chronic atrial fibrillation, unspecified: Secondary | ICD-10-CM

## 2019-03-08 MED ORDER — HYDRALAZINE HCL 25 MG PO TABS: 25.0000 mg | ORAL_TABLET | Freq: Two times a day (BID) | ORAL | 3 refills | Status: DC

## 2019-03-08 NOTE — Patient Instructions (Addendum)
    Medication Instructions: STOP Telmisartan   START Hydralazine 25 mg twice a day   Labwork: We will await the labs from [place] office in 2 weeks  Procedures/Testing: None today  Follow-Up: to be determined after lab work   Any Additional Special Instructions Will Be Listed Below (If Applicable).   Check blood pressure and record daily  If you need a refill on your cardiac medications before your next appointment, please call your pharmacy.

## 2019-03-09 ENCOUNTER — Other Ambulatory Visit: Payer: Self-pay

## 2019-03-09 DIAGNOSIS — Z79899 Other long term (current) drug therapy: Secondary | ICD-10-CM

## 2019-03-09 DIAGNOSIS — I10 Essential (primary) hypertension: Secondary | ICD-10-CM | POA: Diagnosis not present

## 2019-03-09 DIAGNOSIS — E1122 Type 2 diabetes mellitus with diabetic chronic kidney disease: Secondary | ICD-10-CM | POA: Diagnosis not present

## 2019-03-09 DIAGNOSIS — I5031 Acute diastolic (congestive) heart failure: Secondary | ICD-10-CM

## 2019-03-09 DIAGNOSIS — N183 Chronic kidney disease, stage 3 (moderate): Secondary | ICD-10-CM | POA: Diagnosis not present

## 2019-03-09 DIAGNOSIS — E782 Mixed hyperlipidemia: Secondary | ICD-10-CM | POA: Diagnosis not present

## 2019-03-14 ENCOUNTER — Telehealth: Payer: Self-pay | Admitting: Cardiology

## 2019-03-14 DIAGNOSIS — Z79899 Other long term (current) drug therapy: Secondary | ICD-10-CM

## 2019-03-14 MED ORDER — HYDRALAZINE HCL 25 MG PO TABS: 25.0000 mg | ORAL_TABLET | Freq: Three times a day (TID) | ORAL | 3 refills | Status: DC

## 2019-03-14 NOTE — Telephone Encounter (Signed)
Patient's Furosemide dosage was increased. Patient's swelling is better but patient's BP has started running high. BP - 174/54           152/59            178/57            158/55          115/56

## 2019-03-14 NOTE — Telephone Encounter (Signed)
We also recently stopped his telmisartan due to renal insufficiency, this is probably why blood pressure is edging up.  Increase hydralazine to 25 mg 3 times daily.

## 2019-03-14 NOTE — Telephone Encounter (Deleted)
LM for patient to call back.

## 2019-03-14 NOTE — Telephone Encounter (Signed)
I spoke with wife, they will increase hydralazine to TID dosing from BID

## 2019-03-14 NOTE — Telephone Encounter (Signed)
Phone call from [place] with [facility]. Attempted to reach, had to leave message    I will forward to Dr.McDowell

## 2019-03-15 DIAGNOSIS — H5203 Hypermetropia, bilateral: Secondary | ICD-10-CM | POA: Diagnosis not present

## 2019-03-15 DIAGNOSIS — E119 Type 2 diabetes mellitus without complications: Secondary | ICD-10-CM | POA: Diagnosis not present

## 2019-03-15 DIAGNOSIS — H52203 Unspecified astigmatism, bilateral: Secondary | ICD-10-CM | POA: Diagnosis not present

## 2019-03-15 DIAGNOSIS — H35363 Drusen (degenerative) of macula, bilateral: Secondary | ICD-10-CM | POA: Diagnosis not present

## 2019-03-15 DIAGNOSIS — Z961 Presence of intraocular lens: Secondary | ICD-10-CM | POA: Diagnosis not present

## 2019-03-15 DIAGNOSIS — H524 Presbyopia: Secondary | ICD-10-CM | POA: Diagnosis not present

## 2019-03-16 ENCOUNTER — Telehealth: Payer: Self-pay | Admitting: Cardiology

## 2019-03-16 DIAGNOSIS — E1122 Type 2 diabetes mellitus with diabetic chronic kidney disease: Secondary | ICD-10-CM | POA: Diagnosis not present

## 2019-03-16 DIAGNOSIS — E782 Mixed hyperlipidemia: Secondary | ICD-10-CM | POA: Diagnosis not present

## 2019-03-16 DIAGNOSIS — I10 Essential (primary) hypertension: Secondary | ICD-10-CM | POA: Diagnosis not present

## 2019-03-16 DIAGNOSIS — K900 Celiac disease: Secondary | ICD-10-CM | POA: Diagnosis not present

## 2019-03-16 DIAGNOSIS — I482 Chronic atrial fibrillation, unspecified: Secondary | ICD-10-CM | POA: Diagnosis not present

## 2019-03-16 DIAGNOSIS — N183 Chronic kidney disease, stage 3 (moderate): Secondary | ICD-10-CM | POA: Diagnosis not present

## 2019-03-16 DIAGNOSIS — E875 Hyperkalemia: Secondary | ICD-10-CM | POA: Diagnosis not present

## 2019-03-16 NOTE — Telephone Encounter (Signed)
[name] with Dr. Josue Hector office is requesting to speak with Dr. [facility] nurse .

## 2019-03-16 NOTE — Telephone Encounter (Signed)
Called [phone] and left detailed msg for Izora Gala regarding [facility] telephone note on 6/1.

## 2019-03-22 DIAGNOSIS — Z79899 Other long term (current) drug therapy: Secondary | ICD-10-CM | POA: Diagnosis not present

## 2019-03-29 DIAGNOSIS — B351 Tinea unguium: Secondary | ICD-10-CM | POA: Diagnosis not present

## 2019-03-29 DIAGNOSIS — E1142 Type 2 diabetes mellitus with diabetic polyneuropathy: Secondary | ICD-10-CM | POA: Diagnosis not present

## 2019-03-30 DIAGNOSIS — X32XXXD Exposure to sunlight, subsequent encounter: Secondary | ICD-10-CM | POA: Diagnosis not present

## 2019-03-30 DIAGNOSIS — L570 Actinic keratosis: Secondary | ICD-10-CM | POA: Diagnosis not present

## 2019-04-13 DIAGNOSIS — E1122 Type 2 diabetes mellitus with diabetic chronic kidney disease: Secondary | ICD-10-CM | POA: Diagnosis not present

## 2019-04-13 DIAGNOSIS — K900 Celiac disease: Secondary | ICD-10-CM | POA: Diagnosis not present

## 2019-04-13 DIAGNOSIS — E782 Mixed hyperlipidemia: Secondary | ICD-10-CM | POA: Diagnosis not present

## 2019-04-13 DIAGNOSIS — E875 Hyperkalemia: Secondary | ICD-10-CM | POA: Diagnosis not present

## 2019-04-13 DIAGNOSIS — N183 Chronic kidney disease, stage 3 (moderate): Secondary | ICD-10-CM | POA: Diagnosis not present

## 2019-04-13 DIAGNOSIS — I10 Essential (primary) hypertension: Secondary | ICD-10-CM | POA: Diagnosis not present

## 2019-04-13 DIAGNOSIS — I482 Chronic atrial fibrillation, unspecified: Secondary | ICD-10-CM | POA: Diagnosis not present

## 2019-04-19 ENCOUNTER — Ambulatory Visit (INDEPENDENT_AMBULATORY_CARE_PROVIDER_SITE_OTHER): Payer: PPO | Admitting: Student

## 2019-04-19 ENCOUNTER — Other Ambulatory Visit: Payer: Self-pay

## 2019-04-19 ENCOUNTER — Encounter: Payer: Self-pay | Admitting: Student

## 2019-04-19 VITALS — BP 160/64 | HR 69 | Temp 98.5°F | Ht 70.0 in | Wt 228.0 lb

## 2019-04-19 DIAGNOSIS — I350 Nonrheumatic aortic (valve) stenosis: Secondary | ICD-10-CM

## 2019-04-19 DIAGNOSIS — E875 Hyperkalemia: Secondary | ICD-10-CM

## 2019-04-19 DIAGNOSIS — I480 Paroxysmal atrial fibrillation: Secondary | ICD-10-CM | POA: Diagnosis not present

## 2019-04-19 DIAGNOSIS — E785 Hyperlipidemia, unspecified: Secondary | ICD-10-CM

## 2019-04-19 DIAGNOSIS — I10 Essential (primary) hypertension: Secondary | ICD-10-CM | POA: Diagnosis not present

## 2019-04-19 MED ORDER — HYDRALAZINE HCL 50 MG PO TABS: 50.0000 mg | ORAL_TABLET | Freq: Three times a day (TID) | ORAL | 3 refills | Status: DC

## 2019-04-19 NOTE — Patient Instructions (Addendum)
Medication Instructions:  Increase HYDRALAZINE TO 50 MG- THREE TIMES DAILY   Labwork: NONE  Testing/Procedures: NONE  Follow-Up: Your physician recommends that you schedule a follow-up appointment in: 3 MONTHS    Any Other Special Instructions Will Be Listed Below (If Applicable).  PLEASE CALL IN 2-3 WEEKS WITH BLOOD PRESSURES    If you need a refill on your cardiac medications before your next appointment, please call your pharmacy.

## 2019-04-19 NOTE — Progress Notes (Signed)
Cardiology Office Note    Date:  04/19/2019   ID:  [name], [name] 04/28/1929, MRN [id number]  PCP:  Celene Squibb, MD  Cardiologist: Rozann Lesches, MD    Chief Complaint  Patient presents with  . Follow-up    Elevated BP    History of Present Illness:    [name] is a 85 y.o. male with past medical history of paroxysmal atrial fibrillation, HTN, HLD, aortic stenosis, and prior CVA who presents for a 22-month follow-up visit.   He most recently had a telehealth visit with Dr. Domenic Polite on 03/08/2019 and reported worsening lower extremity edema. Recent echo had shown a preserved EF of 60-65% with moderate AS. Telmisartan had recently been discontinued by his PCP due to worsening renal function and hyperkalemia, therefore he was started on Hydralazine 25mg  BID. He called with BP readings the following week and Hydralazine was further titrated to 25mg  TID.   In talking with the patient and his wife today, he reports BP has remained elevated with SBP typically in the 140's to 160's. BP is at 160/64 during today's visit with a similar result of 154/72 on recheck.   He denies any recent chest pain or palpitations. No recent orthopnea, PND or dyspnea on exertion. He ambulates with a cane around his home and when out and about. The patient and his wife have been staying at home as much as possible due to COVID-19.   He reports his lower extremity edema has improved since his last visit. Symptoms initially started around the time he was initiated on [facility] and he thinks this might have played a role. Due for repeat labs with his PCP later this month.    Past Medical History:  Diagnosis Date  . Allergic rhinitis   . Asthmatic bronchitis   . Celiac disease   . Cellulitis   . Diverticulosis   . Fx. left wrist 1987  . Gastric polyps   . GERD (gastroesophageal reflux disease)   . History of pneumonia   . Hypothyroidism   . Iron deficiency anemia   . Melanoma ([place])   .  Neuropathy   . PAF (paroxysmal atrial fibrillation) ([place])   . Prostate cancer ([place])   . REM sleep behavior disorder   . RLS (restless legs syndrome)   . Sleep apnea   . Stroke ([place]) 09/2017  . Type 2 diabetes mellitus ([place])     Past Surgical History:  Procedure Laterality Date  . APPENDECTOMY  1978  . CARPAL TUNNEL RELEASE Left   . CATARACT EXTRACTION  03/2011   bilateral   . CHOLECYSTECTOMY  2001  . COLONOSCOPY W/ BIOPSIES  04/27/2008   diverticulosis, duodenitis  . FOOT SURGERY    . HERNIA REPAIR  1994   bilateral  . KNEE SURGERY Bilateral    x 2  . LUNG SURGERY  2001   for infection  . PROSTATECTOMY  2002  . UPPER GASTROINTESTINAL ENDOSCOPY  04/27/2008   celiac disease, gastric polyps    Current Medications: Outpatient Medications Prior to Visit  Medication Sig Dispense Refill  . amitriptyline (ELAVIL) 50 MG tablet Take 50 mg by mouth at bedtime.    Marland Kitchen apixaban (ELIQUIS) 2.5 MG TABS tablet Take 1 tablet (2.5 mg total) by mouth 2 (two) times daily. 60 tablet 6  . Ascorbic Acid (VITAMIN C) 1000 MG tablet Take 1,000 mg by mouth 2 (two) times daily.      Marland Kitchen atorvastatin (LIPITOR) 20 MG tablet Take  1 tablet (20 mg total) by mouth daily. 90 tablet 3  . azelastine (ASTELIN) 0.1 % nasal spray Place 1 spray into both nostrils at bedtime.     . Cholecalciferol (VITAMIN D3) 1000 UNITS CAPS Take 3,000 Units by mouth 3 (three) times daily.     . diclofenac sodium (VOLTAREN) 1 % GEL Apply 2 g topically 4 (four) times daily. 1 Tube 0  . fluticasone (FLONASE) 50 MCG/ACT nasal spray Place 1 spray into both nostrils daily.     . furosemide (LASIX) 20 MG tablet Take 40 mg by mouth daily.    Marland Kitchen glipiZIDE (GLUCOTROL XL) 2.5 MG 24 hr tablet Take 5 mg by mouth daily with breakfast.     . JANUVIA 100 MG tablet 100 mg daily. morning    . levocetirizine (XYZAL) 5 MG tablet Take 5 mg by mouth at bedtime.     Marland Kitchen levothyroxine (SYNTHROID, LEVOTHROID) 150 MCG tablet Take 1 tablet (150 mcg total) by mouth  daily before breakfast. 30 tablet 0  . LOKELMA 10 g PACK packet Take 10 g by mouth daily.     Marland Kitchen MAGNESIUM MALATE PO Take 500 mg by mouth daily.    . Melatonin 3 MG TABS Take 3 mg by mouth at bedtime.     . metoprolol tartrate (LOPRESSOR) 25 MG tablet Take 75 mg by mouth 2 (two) times daily.    . Multiple Vitamin (MULTIVITAMIN) tablet Take 1 tablet by mouth daily.      . Omega-3 Fatty Acids (FISH OIL) 1000 MG CAPS Take 1 capsule by mouth daily.    . pantoprazole (PROTONIX) 40 MG tablet Take 1 tablet (40 mg total) by mouth daily. 30 tablet 0  . hydrALAZINE (APRESOLINE) 25 MG tablet Take 1 tablet (25 mg total) by mouth 3 (three) times daily. 270 tablet 3   No facility-administered medications prior to visit.      Allergies:   Clindamycin, Gluten meal, Hydrocodone, Penicillins, Procaine, Sulfonamide derivatives, Tape, Wheat bran, Cephalexin, and Mold extract [trichophyton]   Social History   Socioeconomic History  . Marital status: Married    Spouse name: [name]  . Number of children: 3  . Years of education: college degree  . Highest education level: Not on file  Occupational History  . Occupation: Retired    Comment: Careers adviser  . Financial resource strain: Not on file  . Food insecurity    Worry: Not on file    Inability: Not on file  . Transportation needs    Medical: Not on file    Non-medical: Not on file  Tobacco Use  . Smoking status: Never Smoker  . Smokeless tobacco: Never Used  Substance and Sexual Activity  . Alcohol use: No  . Drug use: No  . Sexual activity: Never    Birth control/protection: Abstinence  Lifestyle  . Physical activity    Days per week: Not on file    Minutes per session: Not on file  . Stress: Not on file  Relationships  . Social Herbalist on phone: Not on file    Gets together: Not on file    Attends religious service: Not on file    Active member of club or organization: Not on file    Attends meetings of clubs  or organizations: Not on file    Relationship status: Not on file  Other Topics Concern  . 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   Review of Systems:   Please see the history of present illness.     General:  No chills, fever, night sweats or weight changes.  Cardiovascular:  No chest pain, dyspnea on exertion, orthopnea, palpitations, paroxysmal nocturnal dyspnea. Positive for edema.  Dermatological: No rash, lesions/masses Respiratory: No cough, dyspnea Urologic: No hematuria, dysuria Abdominal:   No nausea, vomiting, diarrhea, bright red blood per rectum, melena, or hematemesis Neurologic:  No visual changes, wkns, changes in mental status. All other systems reviewed and are otherwise negative except as noted above.   Physical Exam:    VS:  BP (!) 160/64 (BP Location: Left Arm)   Pulse 69   Temp 98.5 F (36.9 C)   Ht 5\' 10"  (1.778 m)   Wt 228 lb (103.4 kg)   SpO2 96%   BMI 32.71 kg/m    General: Well developed, well nourished,male appearing in no acute distress. Head: Normocephalic, atraumatic, sclera non-icteric, no xanthomas, nares are without discharge.  Neck: No carotid bruits. JVD not elevated.  Lungs: Respirations regular and unlabored, without wheezes or rales.  Heart: Regular rate and rhythm. No S3 or S4.  No rubs or gallops appreciated. 2/6 SEM along RUSB.  Abdomen: Soft, non-tender, non-distended with normoactive bowel sounds. No hepatomegaly. No rebound/guarding. No obvious abdominal masses. Msk:  Strength and tone appear normal for age. No joint deformities or effusions. Extremities: No clubbing or cyanosis. 1+ pitting edema up to mid-shins.  Compression stockings in place.  Distal pedal pulses are 2+ bilaterally. Neuro: Alert and oriented X 3. Moves all extremities spontaneously. No focal deficits noted. Psych:  Responds to questions appropriately with a normal affect. Skin: No rashes or lesions noted  Wt Readings from Last 3 Encounters:  04/19/19 228 lb (103.4 kg)  03/08/19 224 lb (101.6 kg)  07/05/18 224 lb (101.6 kg)     Studies/Labs Reviewed:   EKG:  EKG is not ordered today.  Recent Labs: No results found for requested labs within last 8760 hours.   Lipid Panel    Component Value Date/Time   CHOL 160 10/03/2017 0833   TRIG 164 (H) 10/03/2017 0833   HDL 37 (L) 10/03/2017 0833   CHOLHDL 4.3 10/03/2017 0833   VLDL 33 10/03/2017 0833   LDLCALC 90 10/03/2017 0833    Additional studies/ records that were reviewed today include:   Echocardiogram: 02/2019 IMPRESSIONS   1. The left ventricle has normal systolic function with an ejection fraction of 60-65%. The cavity size was normal. There is mild concentric left ventricular hypertrophy. Left ventricular diastolic Doppler parameters are consistent with impaired  relaxation. Indeterminate filling pressures No evidence of left ventricular regional wall motion abnormalities.  2. The right ventricle has normal systolic function. The cavity was normal. There is no increase in right ventricular wall thickness.  3. Left atrial size was mildly dilated.  4. The mitral valve is grossly normal.  5. The tricuspid valve is grossly normal.  6. The aortic valve is tricuspid. Moderate thickening of the aortic valve. Moderate calcification of the aortic valve. Aortic valve regurgitation is trivial by color flow Doppler. Moderate stenosis of the aortic valve.  7. Moderate aortic annular calcification.  8. The aortic root is normal in size and structure.  Assessment:    1. Paroxysmal atrial fibrillation (HCC)   2. Essential hypertension   3. Hyperlipidemia LDL goal <70   4.  Nonrheumatic aortic valve stenosis   5. Hyperkalemia      Plan:   In order of problems listed above:  1. Paroxysmal Atrial Fibrillation - he denies any recent palpitations and HR is well-controlled in the 60's during today's visit. Continue Lopressor 75mg  BID. - he denies any evidence of active bleeding. Remains on Eliquis 2.5mg  BID (given age greater than 71 and creatinine at 1.4 - 1.6 by most recent labs). Will need to follow dosing closely given his variable renal function to ensure he remains on the correct dose.   2. HTN - BP remains elevated as outlined above. He is currently on Lopressor 75mg  BID and Hydralazine 25mg  TID. Will plan to titrate Hydralazine to 50mg  TID. I have asked them to call with his BP readings in 2-3 weeks as this may require further titration. Avoid restarting ACE-I or ARB given hyperkalemia.   3. HLD - followed by PCP. Goal LDL is less than 70 in the setting of prior CVA. Remains on Atorvastatin 20mg  daily.   4. Aortic Stenosis - moderate by echo in 02/2019. Continue to follow.   5. Hyperkalemia - ARB therapy was discontinued approximately 2 months ago. K+ 5.2 on 03/22/2019. He remains on [facility] and wishes to discontinue this if possible. I recommended he keep scheduled lab work with his PCP later this month. If K+ has improved, could consider stopping for a trial period with close follow-up lab work. If still elevated off of ARB therapy and with him already being on Lasix, may need to continue and I reviewed this with the patient and his wife today. He has already been referred to Nephrology by his PCP but has not yet arranged an appointment due to COVID-19.      Medication Adjustments/Labs and Tests Ordered: Current medicines are reviewed at length with the patient today.  Concerns regarding medicines are outlined above.  Medication changes, Labs and Tests ordered today are listed in the Patient Instructions below. Patient Instructions  Medication  Instructions:  Increase HYDRALAZINE TO 50 MG- THREE TIMES DAILY   Labwork: NONE  Testing/Procedures: NONE  Follow-Up: Your physician recommends that you schedule a follow-up appointment in: 3 MONTHS    Any Other Special Instructions Will Be Listed Below (If Applicable).  PLEASE CALL IN 2-3 WEEKS WITH BLOOD PRESSURES    If you need a refill on your cardiac medications before your next appointment, please call your pharmacy.    Signed, Erma Heritage, PA-C  04/19/2019 6:59 PM    [place] [address] [address] [place], [place] [address] Phone: [phone] Fax: [phone]

## 2019-04-20 DIAGNOSIS — E119 Type 2 diabetes mellitus without complications: Secondary | ICD-10-CM | POA: Diagnosis not present

## 2019-05-09 DIAGNOSIS — E782 Mixed hyperlipidemia: Secondary | ICD-10-CM | POA: Diagnosis not present

## 2019-05-09 DIAGNOSIS — D649 Anemia, unspecified: Secondary | ICD-10-CM | POA: Diagnosis not present

## 2019-05-09 DIAGNOSIS — D509 Iron deficiency anemia, unspecified: Secondary | ICD-10-CM | POA: Diagnosis not present

## 2019-05-09 DIAGNOSIS — E1142 Type 2 diabetes mellitus with diabetic polyneuropathy: Secondary | ICD-10-CM | POA: Diagnosis not present

## 2019-05-09 DIAGNOSIS — N183 Chronic kidney disease, stage 3 (moderate): Secondary | ICD-10-CM | POA: Diagnosis not present

## 2019-05-09 DIAGNOSIS — D631 Anemia in chronic kidney disease: Secondary | ICD-10-CM | POA: Diagnosis not present

## 2019-05-09 DIAGNOSIS — E1140 Type 2 diabetes mellitus with diabetic neuropathy, unspecified: Secondary | ICD-10-CM | POA: Diagnosis not present

## 2019-05-09 DIAGNOSIS — E1122 Type 2 diabetes mellitus with diabetic chronic kidney disease: Secondary | ICD-10-CM | POA: Diagnosis not present

## 2019-05-10 ENCOUNTER — Telehealth: Payer: Self-pay | Admitting: Student

## 2019-05-10 MED ORDER — HYDRALAZINE HCL 25 MG PO TABS: 75.0000 mg | ORAL_TABLET | Freq: Three times a day (TID) | ORAL | 3 refills | Status: DC

## 2019-05-10 NOTE — Telephone Encounter (Signed)
    Documented that he is currently taking Lopressor 75mg  BID and Hydralazine 50mg  TID (titrated from 25mg  TID to 50mg  TID at his lat visit). Not on ACE-I or ARB given hyperkalemia. Readings overall variable but we would prefer for him to not have SBP's in the 170's. Would recommend further titration to 75mg  TID and continue to follow numbers. Please have him keep track of when his elevated readings occur (AM or PM).   Thanks,  Erma Heritage, PA-C 05/10/2019, 11:47 AM Pager: [phone]

## 2019-05-10 NOTE — Telephone Encounter (Signed)
[id number] 

## 2019-05-10 NOTE — Telephone Encounter (Signed)
Per wife, patient is ok, he doesn't want to say he is feeling fine, he won't elaborate with comment. Wife doesn't know why, BP 149/51, 178/60,163/58, 141/53, 131/51, 144/57, 141/51, 173/51, 114/52, 147/53

## 2019-05-10 NOTE — Telephone Encounter (Signed)
Follow up call   Pt c/o BP issue: STAT if pt c/o blurred vision, one-sided weakness or slurred speech  1. What are your last 5 BP readings? Systolic [id number] range this is consistent for the last 3 weeks   2. Are you having any other symptoms (ex. Dizziness, headache, blurred vision, passed out)?no  3. What is your BP issue? [place] had raised his bp medicine 3 weeks ago and she said to keep track and let her know how its going since changing his medication

## 2019-05-10 NOTE — Telephone Encounter (Signed)
I spoke w ith wife, they will increase hydralazine to 75 mg TID and keep BP log

## 2019-05-10 NOTE — Telephone Encounter (Signed)
[id number]-cc 

## 2019-05-12 DIAGNOSIS — K900 Celiac disease: Secondary | ICD-10-CM | POA: Diagnosis not present

## 2019-05-12 DIAGNOSIS — D631 Anemia in chronic kidney disease: Secondary | ICD-10-CM | POA: Diagnosis not present

## 2019-05-12 DIAGNOSIS — I69351 Hemiplegia and hemiparesis following cerebral infarction affecting right dominant side: Secondary | ICD-10-CM | POA: Diagnosis not present

## 2019-05-12 DIAGNOSIS — E875 Hyperkalemia: Secondary | ICD-10-CM | POA: Diagnosis not present

## 2019-05-12 DIAGNOSIS — I10 Essential (primary) hypertension: Secondary | ICD-10-CM | POA: Diagnosis not present

## 2019-05-12 DIAGNOSIS — I4891 Unspecified atrial fibrillation: Secondary | ICD-10-CM | POA: Diagnosis not present

## 2019-05-12 DIAGNOSIS — I639 Cerebral infarction, unspecified: Secondary | ICD-10-CM | POA: Diagnosis not present

## 2019-05-12 DIAGNOSIS — E1140 Type 2 diabetes mellitus with diabetic neuropathy, unspecified: Secondary | ICD-10-CM | POA: Diagnosis not present

## 2019-05-12 DIAGNOSIS — E782 Mixed hyperlipidemia: Secondary | ICD-10-CM | POA: Diagnosis not present

## 2019-05-12 DIAGNOSIS — N183 Chronic kidney disease, stage 3 (moderate): Secondary | ICD-10-CM | POA: Diagnosis not present

## 2019-05-12 DIAGNOSIS — E669 Obesity, unspecified: Secondary | ICD-10-CM | POA: Diagnosis not present

## 2019-05-12 DIAGNOSIS — I5031 Acute diastolic (congestive) heart failure: Secondary | ICD-10-CM | POA: Diagnosis not present

## 2019-05-31 ENCOUNTER — Telehealth: Payer: Self-pay | Admitting: Cardiology

## 2019-05-31 NOTE — Telephone Encounter (Signed)
Please give pt's wife a call @ [phone] concerning pt's BP readings

## 2019-05-31 NOTE — Telephone Encounter (Signed)
Returned call. No answer, no voicemail.

## 2019-05-31 NOTE — Telephone Encounter (Signed)
Wife notified. They will continue to monitor and report and 2 weeks.

## 2019-05-31 NOTE — Telephone Encounter (Signed)
Wife called to report pt's BP log   8/18   145/52 HR 57 AM  8/17   163/54 HR 56 AM           148/51 HR 64  8/16   164/55 HR 65 AM           143/52 HR 59  8/15   119/51 HR 64 AM           139/49 HR 64  8/14   120/49 HR 63 AM           179/53       59  8/13   165/56       59 AM           169/56       61 8/12   155/53       60 AM           180/57       62  8/11   183/59       58  AM           164/54       58 8/10   123/56       65  AM           156/47       62 8/9     172/57       54  AM 8/8     154/58       55  AM           154/56       63

## 2019-05-31 NOTE — Telephone Encounter (Signed)
    Overall readings remain variable but have improved. Given his low diastolic readings in the 70'W to 60's and SBP variable from 110's to 170's for the past week, would continue to monitor for now as he is 3 weeks out from the most recent dose change of Hydralazine so we are now seeing the full effect of the medication. Please make sure he is spreading the dose out by every 8 hours as much as possible. If SBP continues to peak into the 160's-170's over the next 2 weeks, we can titrate Hydralzine from 75mg  TID to 100mg  TID. Would not further increase Lopressor given his low-normal HR.   Signed, Erma Heritage, PA-C 05/31/2019, 12:21 PM Pager: [phone]

## 2019-06-07 DIAGNOSIS — E1142 Type 2 diabetes mellitus with diabetic polyneuropathy: Secondary | ICD-10-CM | POA: Diagnosis not present

## 2019-06-07 DIAGNOSIS — B351 Tinea unguium: Secondary | ICD-10-CM | POA: Diagnosis not present

## 2019-06-10 DIAGNOSIS — E119 Type 2 diabetes mellitus without complications: Secondary | ICD-10-CM | POA: Diagnosis not present

## 2019-06-14 ENCOUNTER — Telehealth: Payer: Self-pay | Admitting: Cardiology

## 2019-06-14 NOTE — Telephone Encounter (Signed)
Left message wanting to give BP readings to nurse   Please call [phone]

## 2019-06-15 NOTE — Telephone Encounter (Signed)
Blood pressure readings are as follows: 140/52, 176/57, 160/52, 153/58, 173/57, 160/55, 167/57, 149/54, 162/55, 157/54, 165/55, 158/50 139/50, 167/54, 173/64, 160/52, 165/57, 163/57, 162/55, 177/53, 153/56, 164/57    Pulse ranges from 58-62 bpm   Pt's wife mentioned that he has been having some shortness of breath recently.  He is complaining of some congestion when he wakes up in the morning, is able to cough it up and as the day goes is feeling better. His wife wonders if that is heart related or an issue to discuss with PCP.

## 2019-06-15 NOTE — Telephone Encounter (Signed)
    Would recommend titration of Hydralazine to 100 mg three times daily (can send in new tablets as he only has 25mg  tablets at home).  He can use Mucinex but make sure this is plain Mucinex and not Mucinex DM as this can influence his blood pressure. If no improvement, would recommend follow-up with his PCP given the productive cough.   Please make sure he does have upcoming follow-up with Cardiology given his shortness of breath and continuously elevated BP despite multiple medication adjustments.   Signed, Erma Heritage, PA-C 06/15/2019, 4:38 PM Pager: [phone]

## 2019-06-16 MED ORDER — HYDRALAZINE HCL 100 MG PO TABS: 100.0000 mg | ORAL_TABLET | Freq: Three times a day (TID) | ORAL | 3 refills | Status: DC

## 2019-06-16 NOTE — Telephone Encounter (Signed)
Pt's wife made aware, voiced understanding of plan. New RX sent to [facility]. She will call in a few weeks to update bp.

## 2019-07-06 DIAGNOSIS — N183 Chronic kidney disease, stage 3 (moderate): Secondary | ICD-10-CM | POA: Diagnosis not present

## 2019-07-06 DIAGNOSIS — E875 Hyperkalemia: Secondary | ICD-10-CM | POA: Diagnosis not present

## 2019-07-06 DIAGNOSIS — I10 Essential (primary) hypertension: Secondary | ICD-10-CM | POA: Diagnosis not present

## 2019-07-06 DIAGNOSIS — I482 Chronic atrial fibrillation, unspecified: Secondary | ICD-10-CM | POA: Diagnosis not present

## 2019-07-06 DIAGNOSIS — K900 Celiac disease: Secondary | ICD-10-CM | POA: Diagnosis not present

## 2019-07-06 DIAGNOSIS — E782 Mixed hyperlipidemia: Secondary | ICD-10-CM | POA: Diagnosis not present

## 2019-07-06 DIAGNOSIS — E1122 Type 2 diabetes mellitus with diabetic chronic kidney disease: Secondary | ICD-10-CM | POA: Diagnosis not present

## 2019-07-11 ENCOUNTER — Telehealth: Payer: Self-pay | Admitting: Cardiology

## 2019-07-11 NOTE — Telephone Encounter (Signed)
Returned pt call. Wife states that she has pt's past month of blood pressures charted. I asked her to email them to me. As she cannot drop off. She voiced understanding.

## 2019-07-11 NOTE — Telephone Encounter (Signed)
Please give pt a call  °

## 2019-07-25 DIAGNOSIS — D649 Anemia, unspecified: Secondary | ICD-10-CM | POA: Diagnosis not present

## 2019-07-25 DIAGNOSIS — I4891 Unspecified atrial fibrillation: Secondary | ICD-10-CM | POA: Diagnosis not present

## 2019-07-25 DIAGNOSIS — I129 Hypertensive chronic kidney disease with stage 1 through stage 4 chronic kidney disease, or unspecified chronic kidney disease: Secondary | ICD-10-CM | POA: Diagnosis not present

## 2019-07-25 DIAGNOSIS — I5032 Chronic diastolic (congestive) heart failure: Secondary | ICD-10-CM | POA: Diagnosis not present

## 2019-07-25 DIAGNOSIS — M62838 Other muscle spasm: Secondary | ICD-10-CM | POA: Diagnosis not present

## 2019-07-25 DIAGNOSIS — L97921 Non-pressure chronic ulcer of unspecified part of left lower leg limited to breakdown of skin: Secondary | ICD-10-CM | POA: Diagnosis not present

## 2019-07-25 DIAGNOSIS — L0213 Carbuncle of neck: Secondary | ICD-10-CM | POA: Diagnosis not present

## 2019-07-25 DIAGNOSIS — Z0001 Encounter for general adult medical examination with abnormal findings: Secondary | ICD-10-CM | POA: Diagnosis not present

## 2019-07-25 DIAGNOSIS — L97811 Non-pressure chronic ulcer of other part of right lower leg limited to breakdown of skin: Secondary | ICD-10-CM | POA: Diagnosis not present

## 2019-07-25 DIAGNOSIS — R251 Tremor, unspecified: Secondary | ICD-10-CM | POA: Diagnosis not present

## 2019-07-25 DIAGNOSIS — I480 Paroxysmal atrial fibrillation: Secondary | ICD-10-CM | POA: Diagnosis not present

## 2019-07-25 DIAGNOSIS — I4819 Other persistent atrial fibrillation: Secondary | ICD-10-CM | POA: Diagnosis not present

## 2019-07-25 DIAGNOSIS — I350 Nonrheumatic aortic (valve) stenosis: Secondary | ICD-10-CM | POA: Diagnosis not present

## 2019-07-25 DIAGNOSIS — D631 Anemia in chronic kidney disease: Secondary | ICD-10-CM | POA: Diagnosis not present

## 2019-07-26 DIAGNOSIS — I482 Chronic atrial fibrillation, unspecified: Secondary | ICD-10-CM | POA: Diagnosis not present

## 2019-07-26 DIAGNOSIS — E782 Mixed hyperlipidemia: Secondary | ICD-10-CM | POA: Diagnosis not present

## 2019-07-26 DIAGNOSIS — E875 Hyperkalemia: Secondary | ICD-10-CM | POA: Diagnosis not present

## 2019-07-26 DIAGNOSIS — K900 Celiac disease: Secondary | ICD-10-CM | POA: Diagnosis not present

## 2019-07-26 DIAGNOSIS — E1122 Type 2 diabetes mellitus with diabetic chronic kidney disease: Secondary | ICD-10-CM | POA: Diagnosis not present

## 2019-07-26 DIAGNOSIS — I10 Essential (primary) hypertension: Secondary | ICD-10-CM | POA: Diagnosis not present

## 2019-07-27 ENCOUNTER — Other Ambulatory Visit: Payer: Self-pay

## 2019-07-27 ENCOUNTER — Other Ambulatory Visit (HOSPITAL_COMMUNITY)
Admission: RE | Admit: 2019-07-27 | Discharge: 2019-07-27 | Disposition: A | Discharge status: Home / Self Care | Payer: PPO | Source: Ambulatory Visit | Attending: Cardiology | Admitting: Cardiology

## 2019-07-27 ENCOUNTER — Ambulatory Visit: Payer: PPO | Admitting: Cardiology

## 2019-07-27 ENCOUNTER — Encounter: Payer: Self-pay | Admitting: Cardiology

## 2019-07-27 VITALS — BP 178/55 | HR 59 | Ht 70.0 in | Wt 226.0 lb

## 2019-07-27 DIAGNOSIS — I10 Essential (primary) hypertension: Secondary | ICD-10-CM | POA: Diagnosis not present

## 2019-07-27 DIAGNOSIS — E875 Hyperkalemia: Secondary | ICD-10-CM

## 2019-07-27 DIAGNOSIS — I350 Nonrheumatic aortic (valve) stenosis: Secondary | ICD-10-CM | POA: Diagnosis not present

## 2019-07-27 DIAGNOSIS — I480 Paroxysmal atrial fibrillation: Secondary | ICD-10-CM | POA: Diagnosis not present

## 2019-07-27 LAB — BASIC METABOLIC PANEL
Anion gap: 8 (ref 5–15)
BUN: 37 mg/dL — ABNORMAL HIGH (ref 8–23)
CO2: 27 mmol/L (ref 22–32)
Calcium: 8.9 mg/dL (ref 8.9–10.3)
Chloride: 103 mmol/L (ref 98–111)
Creatinine, Ser: 1.52 mg/dL — ABNORMAL HIGH (ref 0.61–1.24)
GFR calc Af Amer: 47 mL/min — ABNORMAL LOW (ref >60)
GFR calc non Af Amer: 40 mL/min — ABNORMAL LOW (ref >60)
Glucose, Bld: 179 mg/dL — ABNORMAL HIGH (ref 70–99)
Potassium: 4.8 mmol/L (ref 3.5–5.1)
Sodium: 138 mmol/L (ref 135–145)

## 2019-07-27 IMAGING — CT CT HEAD W/O CM
4 series · 17 of 47 positions shown, 19 images · non-contrast
Comparison: Brain MRI 10/03/2017

CLINICAL DATA: Stroke follow-up

EXAM:
CT HEAD WITHOUT CONTRAST
TECHNIQUE: Contiguous axial images were obtained from the base of the skull
through the vertex without intravenous contrast.

[Series 3: head without · axial · non-contrast · 0.49mm/px · z∈[-135,+10]mm · 7 of 39 slices shown, 9 images]
[im 5/39  brain]
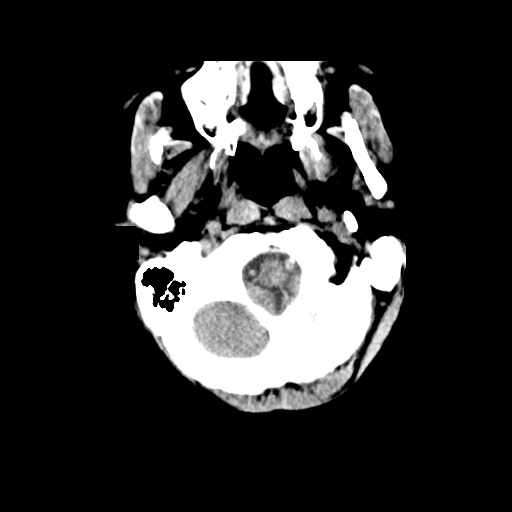
[im 5/39  bone]
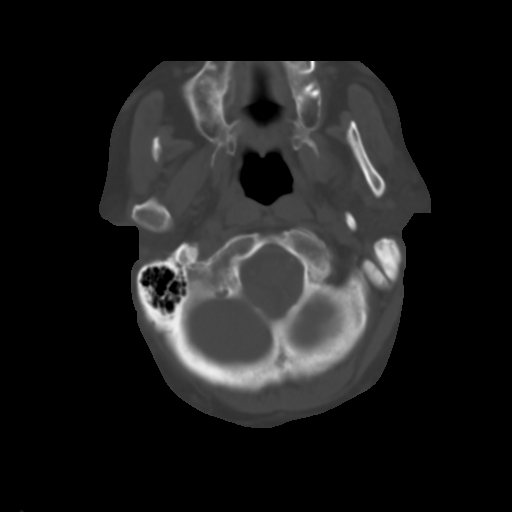
[im 10/39  brain]
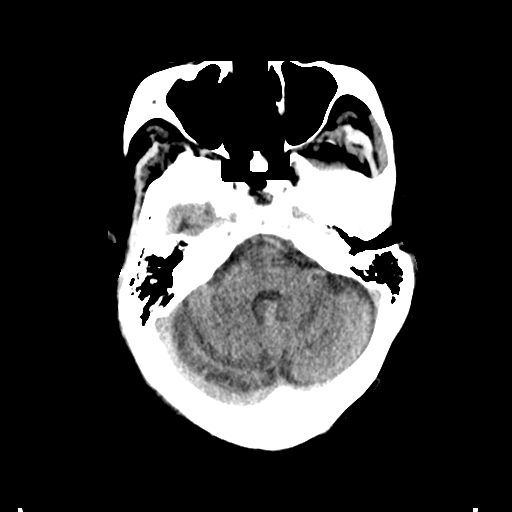
[im 15/39  brain]
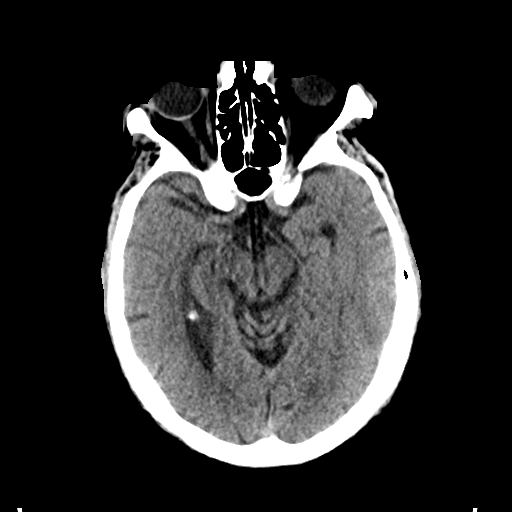
[im 20/39  brain]
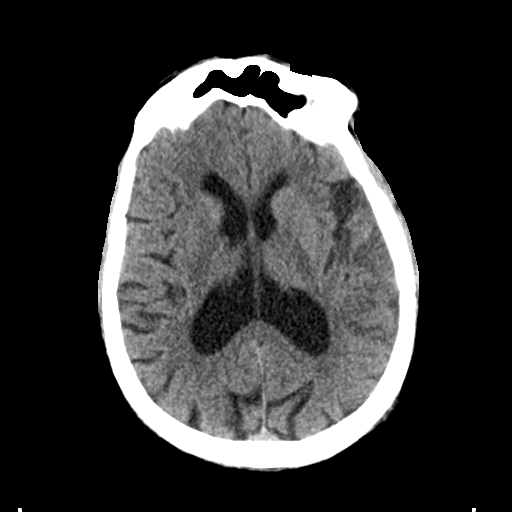
[im 24/39  brain]
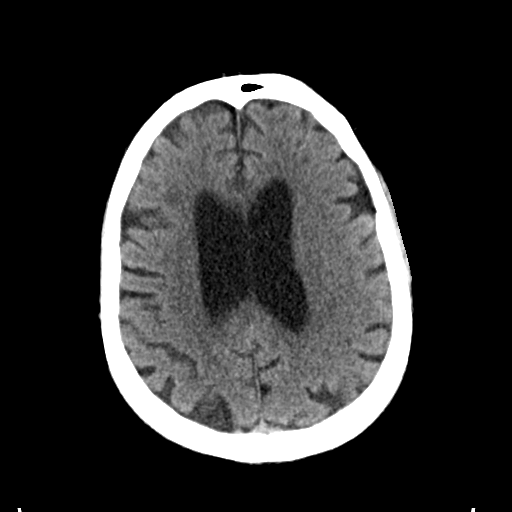
[im 24/39  bone]
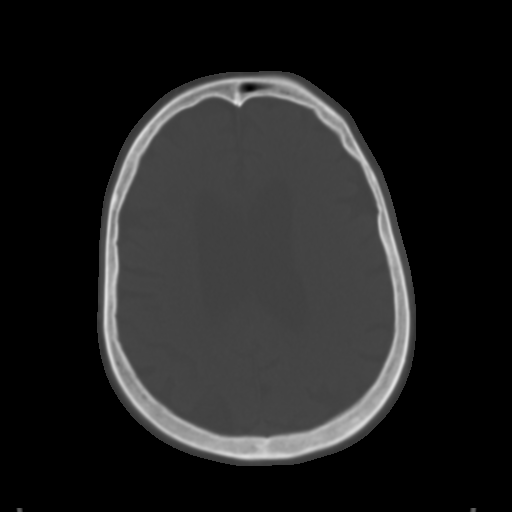
[im 29/39  brain]
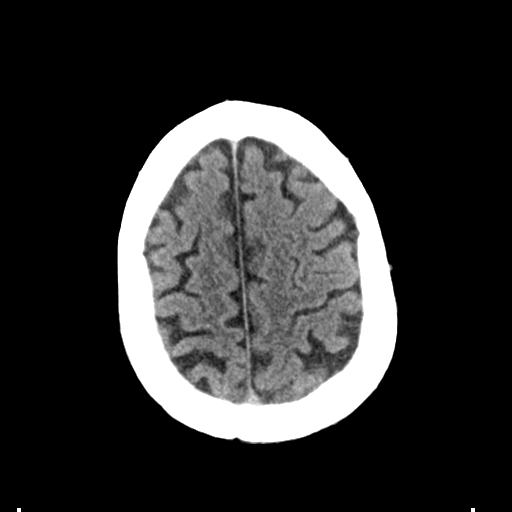
[im 34/39  brain]
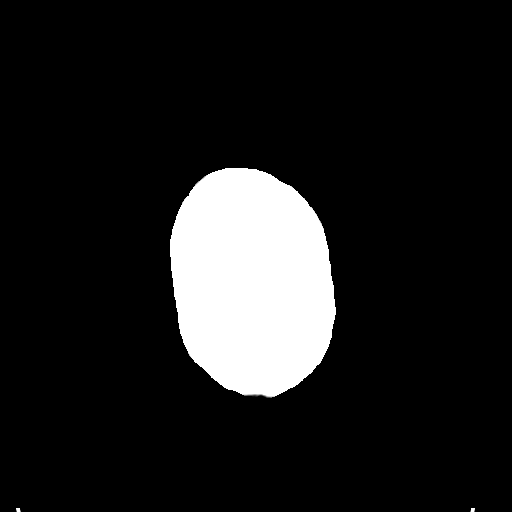

[Series 4: head bone · axial · 0.49mm/px · z∈[-137,-69]mm · 4 of 97 slices shown]
[im 10/97  bone]
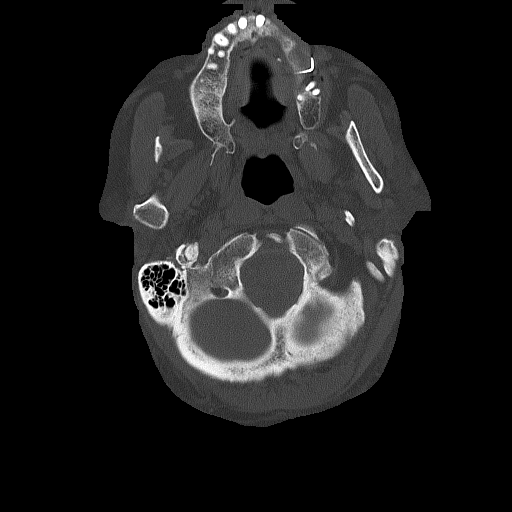
[im 20/97  bone]
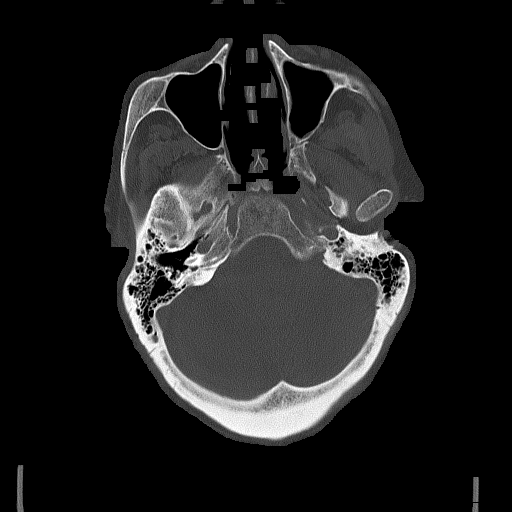
[im 29/97  bone]
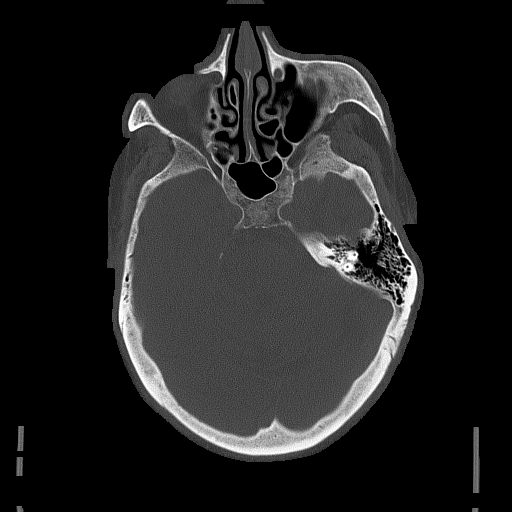
[im 44/97  bone]
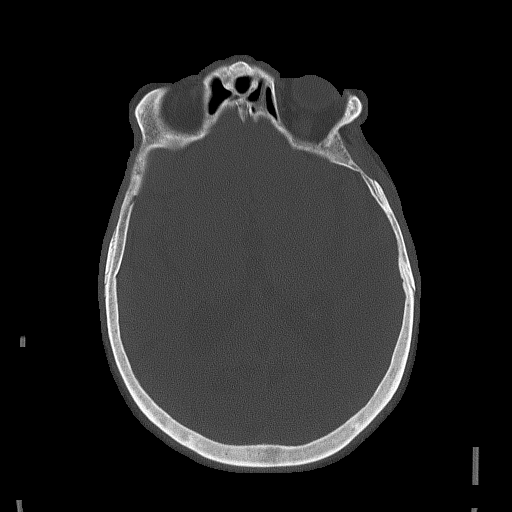

[Series 5: head without cor · coronal · non-contrast · 0.38mm/px · 3 of 80 slices shown]
[im 27/80  brain]
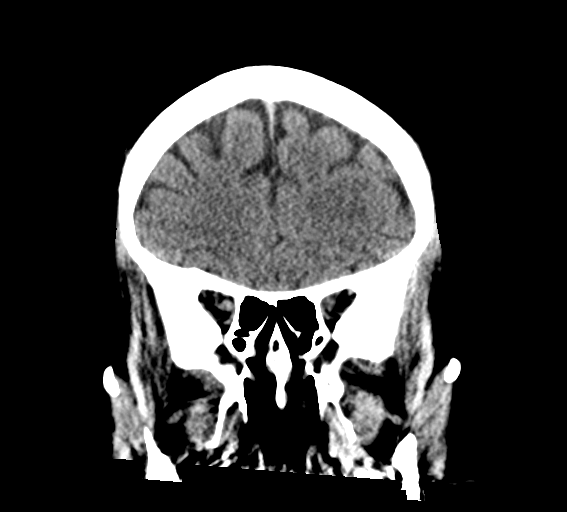
[im 36/80  brain]
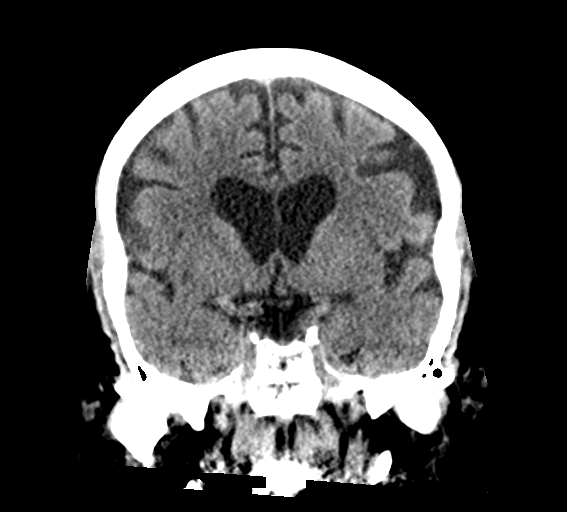
[im 44/80  brain]
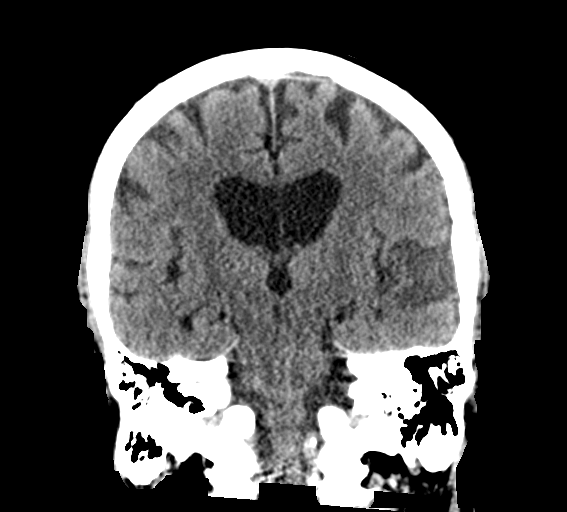

[Series 6: head without sag · sagittal · non-contrast · 0.42mm/px · 3 of 67 slices shown]
[im 23/67  brain]
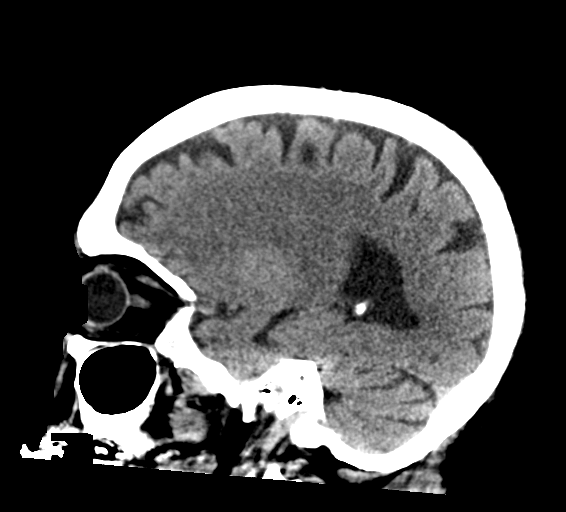
[im 34/67  brain]
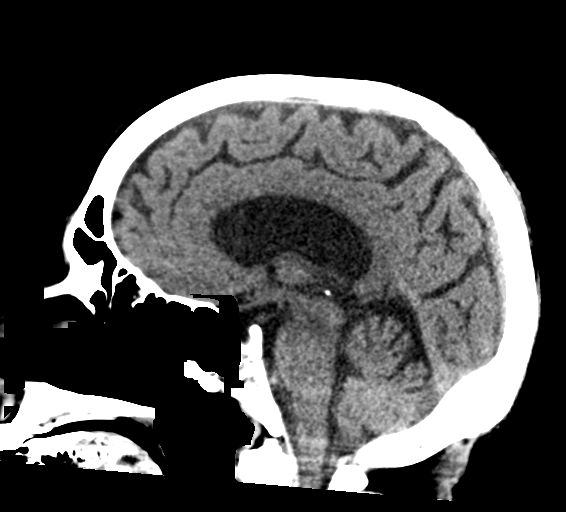
[im 45/67  brain]
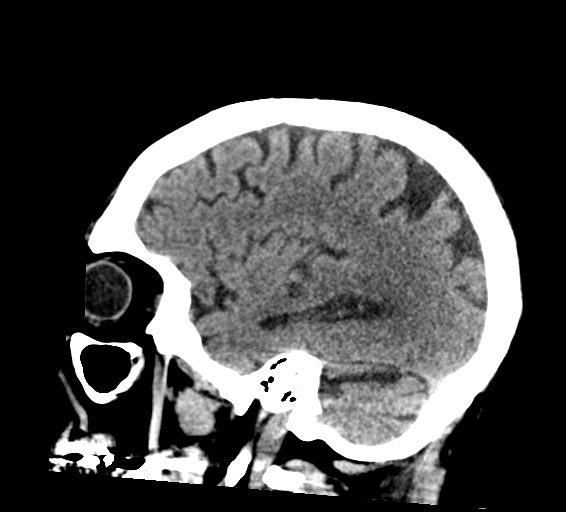

[17 of 47 positions shown; findings below may reference images not displayed]

FINDINGS: Brain: There is cytotoxic edema within the left temporal lobe at the
site of known infarct. No acute hemorrhage. No midline shift or
other mass effect. Size and configuration of the ventricles is
unchanged.

Vascular: No hyperdense vessel or unexpected calcification.

Skull: Normal skullbase.  No calvarial fracture.

Sinuses/Orbits: The paranasal sinuses are clear. No mastoid or
middle ear effusion. Normal orbits.

Other: None
IMPRESSION: Cytotoxic edema at the site of known left temporal lobe infarct. No
hemorrhage or mass effect.

## 2019-07-27 IMAGING — MR MR HEAD W/O CM
9 of 10 series · 36 of 48 positions shown · non-contrast
Comparison: CT head and CTA head neck 10/02/2017

CLINICAL DATA: Expressive and receptive aphasia which began
yesterday. tPA was given.

EXAM:
MRI HEAD WITHOUT CONTRAST
TECHNIQUE: Multiplanar, multiecho pulse sequences of the brain and surrounding
structures were obtained without intravenous contrast.

[Series 3: DWI · axial · 3.0mm · 0.94mm/px · z∈[-100,+61]mm · 11 of 112 slices shown (1 of 2)]
[im 1/112]
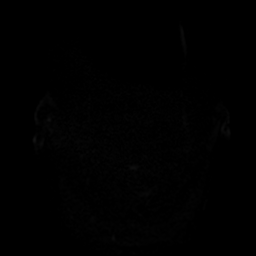
[im 12/112]
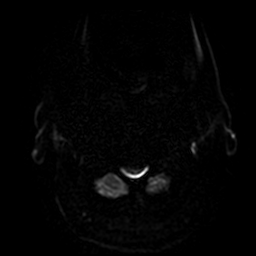
[im 23/112]
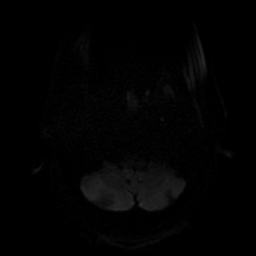
[im 34/112]
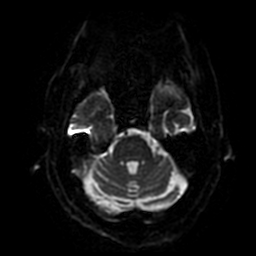
[im 45/112]
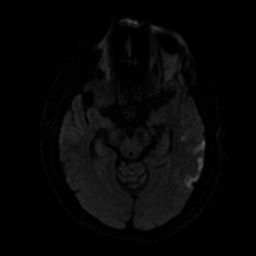
[im 56/112]
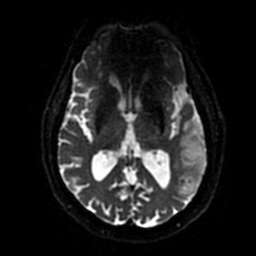
[im 67/112]
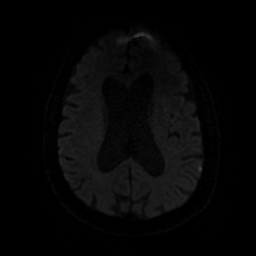
[im 78/112]
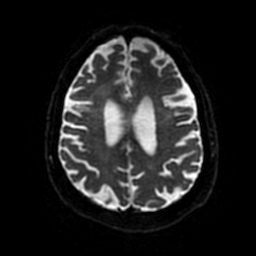
[im 89/112]
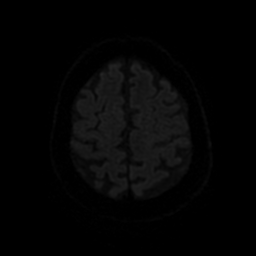
[im 100/112]
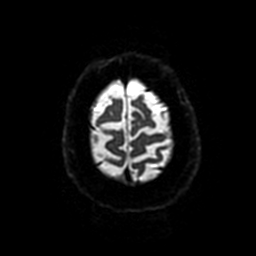
[im 112/112]
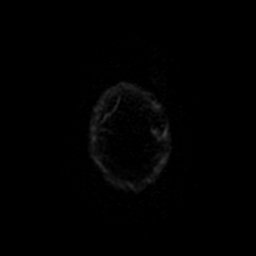

[Series 4: DWI · coronal · 4.0mm · 0.94mm/px · 6 of 72 slices shown (2 of 2)]
[im 1/72]
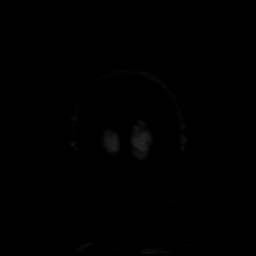
[im 15/72]
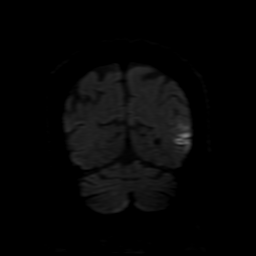
[im 29/72]
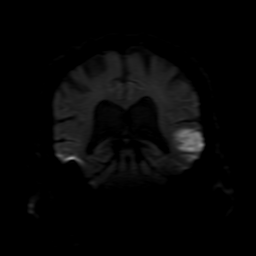
[im 43/72]
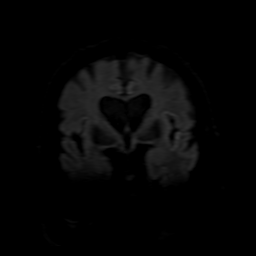
[im 57/72]
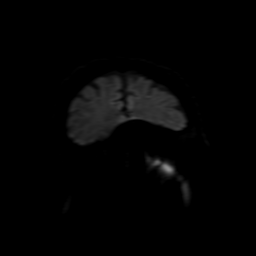
[im 72/72]
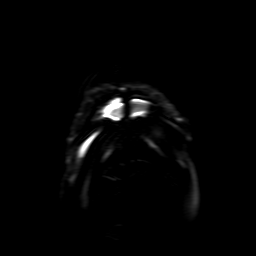

[Series 5: FLAIR · sagittal · 5.0mm · 0.47mm/px · 2 of 25 slices shown (1 of 2)]
[im 1/25]
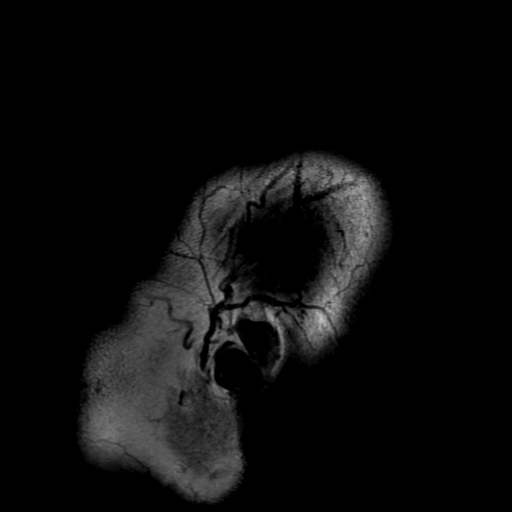
[im 25/25]
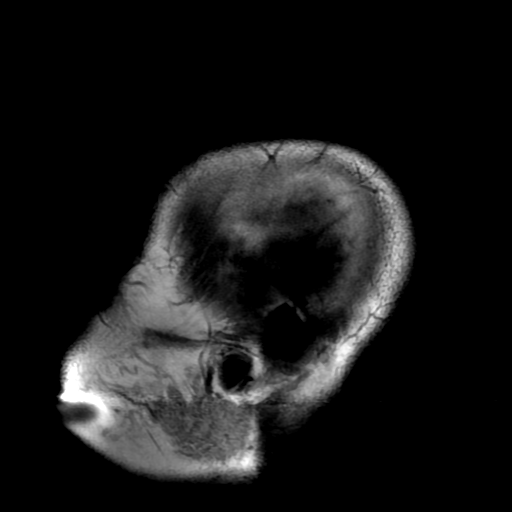

[Series 7: T2 · axial · 5.0mm · 0.47mm/px · z∈[-111,+53]mm · 2 of 29 slices shown (1 of 2)]
[im 1/29]
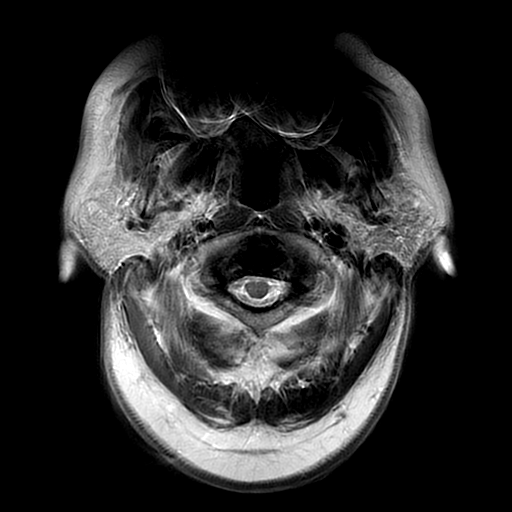
[im 29/29]
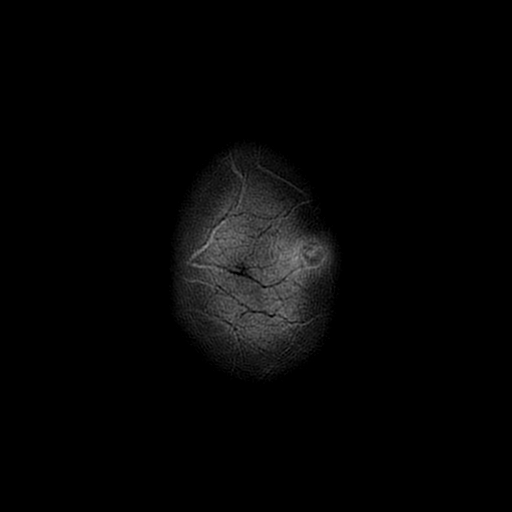

[Series 8: FLAIR · axial · 3.0mm · 0.43mm/px · z∈[-111,+54]mm · 2 of 29 slices shown (2 of 2)]
[im 1/29]
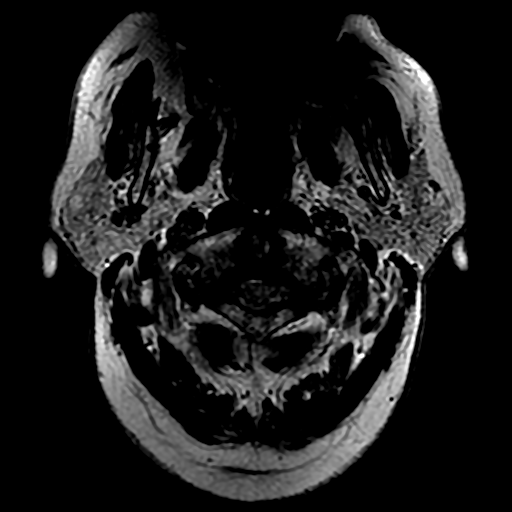
[im 29/29]
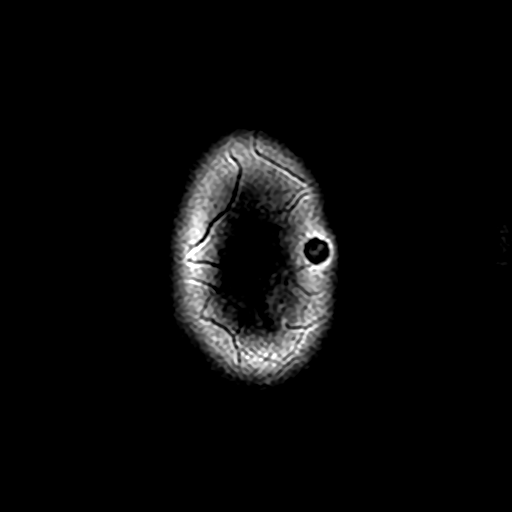

[Series 9: (person_name) · axial · 3.0mm · 0.47mm/px · z∈[-108,-89]mm · 2 of 108 slices shown]
[im 1/108]
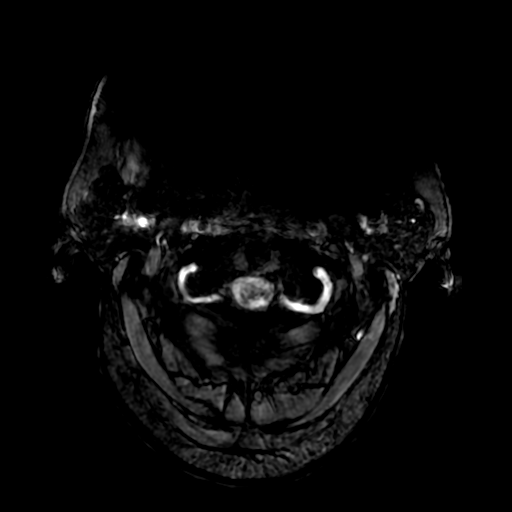
[im 14/108]
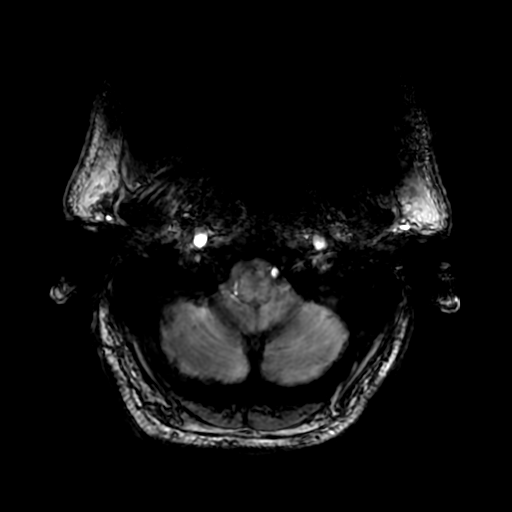

[Series 11: T2 · coronal · 5.0mm · 0.39mm/px · 3 of 32 slices shown (2 of 2)]
[im 1/32]
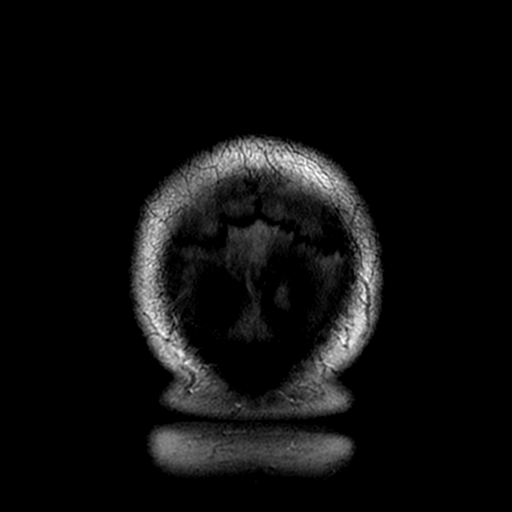
[im 16/32]
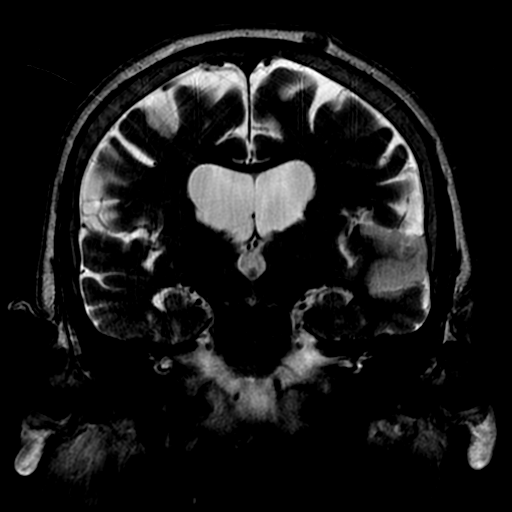
[im 32/32]
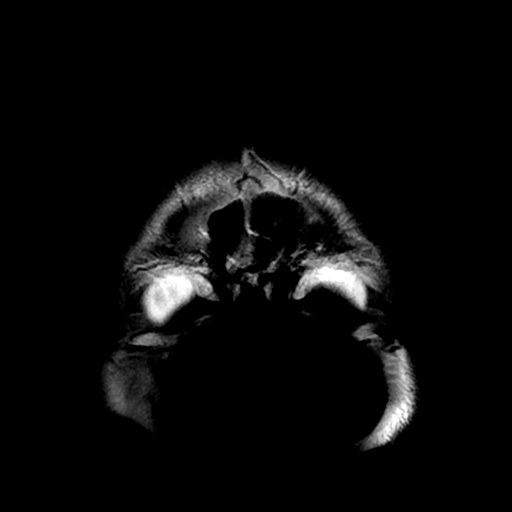

[Series 350: ADC · axial · 3.0mm · 0.94mm/px · z∈[-100,+61]mm · 5 of 56 slices shown (1 of 2)]
[im 1/56]
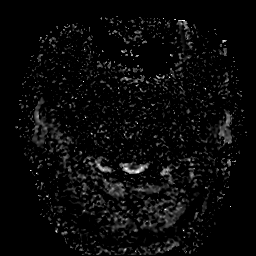
[im 14/56]
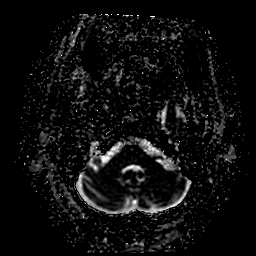
[im 28/56]
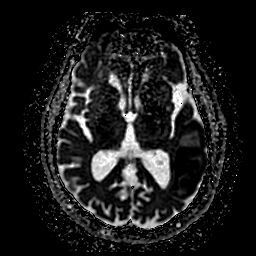
[im 42/56]
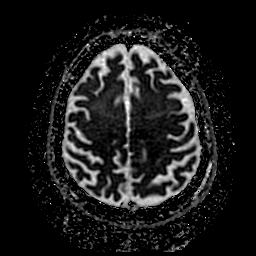
[im 56/56]
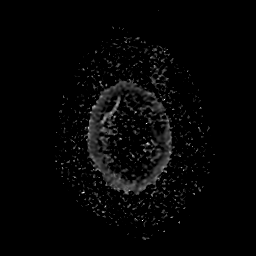

[Series 450: ADC · coronal · 4.0mm · 0.94mm/px · 3 of 36 slices shown (2 of 2)]
[im 1/36]
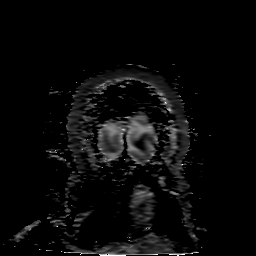
[im 18/36]
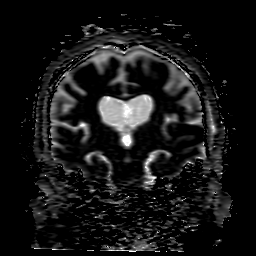
[im 36/36]
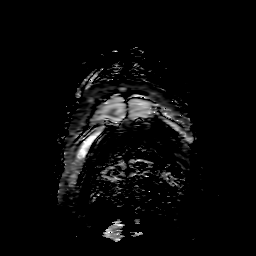

[36 of 48 positions shown; findings below may reference images not displayed]

FINDINGS: Brain: Moderate-sized area of acute infarction (restricted
diffusion, low ADC), affects the LEFT temporal lobe operculum,
extending deep into the sylvian fissure to involve much of the
superior temporal gyrus, also slight involvement of the LEFT insula.
Posterior involvement likely affects Fraser area, particularly
with adjacent white matter ischemia. Other than a punctate focus of
acute cortical infarction affecting the LEFT posterior frontal lobe,
no other areas of involvement are seen.

Atrophy with chronic microvascular ischemic change. Ventriculomegaly
likely hydrocephalus ex vacuo. Mild subcortical and periventricular
T2 and FLAIR hyperintensities, likely chronic microvascular ischemic
change. An

Gradient sequence demonstrates reperfusion petechial type hemorrhage
throughout the infarct. There is some coalescence posteriorly.

Vascular: Flow voids are maintained.

Skull and upper cervical spine: Normal marrow signal.

Sinuses/Orbits: Negative.

Other: None.
IMPRESSION: Acute LEFT MCA territory infarct, primarily temporal lobe
involvement, likely a single LEFT M3 branch, cannot be visualized
even in retrospect on yesterday's imaging studies.

Atrophy and small vessel disease.

Consistent with history of atrial fibrillation, this is likely an
embolic stroke, with evidence for reperfusion on gradient sequence
today. Some coalescence posteriorly. Baseline noncontrast CT of the
head recommended for follow-up. A call has been placed to the
ordering provider.

## 2019-07-27 MED ORDER — AMLODIPINE BESYLATE 2.5 MG PO TABS: 2.5000 mg | ORAL_TABLET | Freq: Every day | ORAL | 3 refills | Status: DC

## 2019-07-27 NOTE — Patient Instructions (Signed)
Medication Instructions:  Your physician recommends that you continue on your current medications as directed. Please refer to the Current Medication list given to you today.  Start Norvasc 2.5 mg Daily   If you need a refill on your cardiac medications before your next appointment, please call your pharmacy.   Lab work: Your physician recommends that you return for lab work in: Today   If you have labs (blood work) drawn today and your tests are completely normal, you will receive your results only by: Marland Kitchen MyChart Message (if you have MyChart) OR . A paper copy in the mail If you have any lab test that is abnormal or we need to change your treatment, we will call you to review the results.  Testing/Procedures: NONE   Follow-Up: At [facility], you and your health needs are our priority.  As part of our continuing mission to provide you with exceptional heart care, we have created designated Provider Care Teams.  These Care Teams include your primary Cardiologist (physician) and Advanced Practice Providers (APPs -  Physician Assistants and Nurse Practitioners) who all work together to provide you with the care you need, when you need it. You will need a follow up appointment in 4 months.  Please call our office 2 months in advance to schedule this appointment.  You may see Rozann Lesches, MD or one of the following Advanced Practice Providers on your designated Care Team:   Bernerd Pho, PA-C [facility]) . Ermalinda Barrios, PA-C ([place])  Any Other Special Instructions Will Be Listed Below (If Applicable). Thank you for choosing [facility]!

## 2019-07-27 NOTE — Progress Notes (Signed)
Cardiology Office Note  Date: 07/27/2019   ID: [name], [name] February 05, 1929, MRN [id number]  PCP:  Calvin Squibb, MD  Cardiologist:  Calvin Lesches, MD Electrophysiologist:  None   Chief Complaint  Patient presents with  . Cardiac follow-up    History of Present Illness: [name] is an 85 y.o. male last seen in July by Ms. Strader PA-C.  He is here today with his wife for a follow-up visit.  I reviewed home blood pressure, heart rate, and weight checks.  Still does not have optimal blood pressure control on average with outliers like today's measurement noted.  He reports compliance with both Lopressor and hydralazine which is now at 100 mg 3 times a day.  This has been steadily increased.  He reports seeing his PCP earlier in the week due to intermittent "jerks".  Elavil dose was cut back.  He was also referred to neurology.  I reviewed his remaining medications.  He continues on [facility], has not seen a nephrologist as yet.  We discussed getting a BMET today.  Weight has fluctuated up and down by 2 pounds or so.  We discussed using an additional Lasix if his weight goes up 2 pounds in 24 hours.  Echocardiogram from May is outlined below.  I reviewed his lab work from July.  Past Medical History:  Diagnosis Date  . Allergic rhinitis   . Asthmatic bronchitis   . Celiac disease   . Cellulitis   . Diverticulosis   . Fx. left wrist 1987  . Gastric polyps   . GERD (gastroesophageal reflux disease)   . History of pneumonia   . Hypothyroidism   . Iron deficiency anemia   . Melanoma ([place])   . Neuropathy   . PAF (paroxysmal atrial fibrillation) ([place])   . Prostate cancer ([place])   . REM sleep behavior disorder   . RLS (restless legs syndrome)   . Sleep apnea   . Stroke ([place]) 09/2017  . Type 2 diabetes mellitus ([place])     Past Surgical History:  Procedure Laterality Date  . APPENDECTOMY  1978  . CARPAL TUNNEL RELEASE Left   . CATARACT EXTRACTION  03/2011   bilateral   . CHOLECYSTECTOMY  2001  . COLONOSCOPY W/ BIOPSIES  04/27/2008   diverticulosis, duodenitis  . FOOT SURGERY    . HERNIA REPAIR  1994   bilateral  . KNEE SURGERY Bilateral    x 2  . LUNG SURGERY  2001   for infection  . PROSTATECTOMY  2002  . UPPER GASTROINTESTINAL ENDOSCOPY  04/27/2008   celiac disease, gastric polyps    Current Outpatient Medications  Medication Sig Dispense Refill  . amitriptyline (ELAVIL) 50 MG tablet Take 50 mg by mouth at bedtime.    Marland Kitchen apixaban (ELIQUIS) 2.5 MG TABS tablet Take 1 tablet (2.5 mg total) by mouth 2 (two) times daily. 60 tablet 6  . Ascorbic Acid (VITAMIN C) 1000 MG tablet Take 1,000 mg by mouth 2 (two) times daily.      Marland Kitchen atorvastatin (LIPITOR) 20 MG tablet Take 1 tablet (20 mg total) by mouth daily. 90 tablet 3  . azelastine (ASTELIN) 0.1 % nasal spray Place 1 spray into both nostrils at bedtime.     . Cholecalciferol (VITAMIN D3) 1000 UNITS CAPS Take 3,000 Units by mouth 3 (three) times daily.     . diclofenac sodium (VOLTAREN) 1 % GEL Apply 2 g topically 4 (four) times daily. 1 Tube 0  .  fluticasone (FLONASE) 50 MCG/ACT nasal spray Place 1 spray into both nostrils daily.     . furosemide (LASIX) 20 MG tablet Take 40 mg by mouth daily.    Marland Kitchen glipiZIDE (GLUCOTROL XL) 2.5 MG 24 hr tablet Take 5 mg by mouth daily with breakfast.     . hydrALAZINE (APRESOLINE) 100 MG tablet Take 1 tablet (100 mg total) by mouth 3 (three) times daily. 180 tablet 3  . JANUVIA 100 MG tablet 100 mg daily. morning    . levocetirizine (XYZAL) 5 MG tablet Take 5 mg by mouth at bedtime.     Marland Kitchen levothyroxine (SYNTHROID, LEVOTHROID) 150 MCG tablet Take 1 tablet (150 mcg total) by mouth daily before breakfast. 30 tablet 0  . LOKELMA 10 g PACK packet Take 10 g by mouth daily.     Marland Kitchen MAGNESIUM MALATE PO Take 500 mg by mouth daily.    . Melatonin 3 MG TABS Take 3 mg by mouth at bedtime.     . metoprolol tartrate (LOPRESSOR) 25 MG tablet Take 75 mg by mouth 2 (two) times  daily.    . Multiple Vitamin (MULTIVITAMIN) tablet Take 1 tablet by mouth daily.      . Omega-3 Fatty Acids (FISH OIL) 1000 MG CAPS Take 1 capsule by mouth daily.    . pantoprazole (PROTONIX) 40 MG tablet Take 1 tablet (40 mg total) by mouth daily. 30 tablet 0  . amLODipine (NORVASC) 2.5 MG tablet Take 1 tablet (2.5 mg total) by mouth daily. 90 tablet 3   No current facility-administered medications for this visit.    Allergies:  Clindamycin, Gluten meal, Hydrocodone, Penicillins, Procaine, Sulfonamide derivatives, Tape, Wheat bran, Cephalexin, and Mold extract [trichophyton]   Social History: The patient  reports that he has never smoked. He has never used smokeless tobacco. He reports that he does not drink alcohol or use drugs.   ROS:  Please see the history of present illness. Otherwise, complete review of systems is positive for hearing loss, dyspnea on exertion, uses a walker.  All other systems are reviewed and negative.   Physical Exam: VS:  BP (!) 178/55   Pulse (!) 59   Ht 5\' 10"  (1.778 m)   Wt 226 lb (102.5 kg)   SpO2 96%   BMI 32.43 kg/m , BMI Body mass index is 32.43 kg/m.  Wt Readings from Last 3 Encounters:  07/27/19 226 lb (102.5 kg)  04/19/19 228 lb (103.4 kg)  03/08/19 224 lb (101.6 kg)    General: Elderly male, using a walker. HEENT: Conjunctiva and lids normal, wearing a mask. Neck: Supple, no elevated JVP or carotid bruits, no thyromegaly. Lungs: Clear to auscultation, nonlabored breathing at rest. Cardiac: Regular rate and rhythm, no S3, 1-5/1 systolic murmur. Abdomen: Soft, nontender, bowel sounds present. Extremities: Mild ankle edema, distal pulses 2+. Skin: Warm and dry. Musculoskeletal: No kyphosis. Neuropsychiatric: Alert and oriented x3, affect grossly appropriate.  ECG:  An ECG dated 03/02/2018 was personally reviewed today and demonstrated:  Sinus rhythm with right bundle branch block.  Recent Labwork:  July 2020: Hemoglobin 11.0, platelets  309, BUN 29, creatinine 1.22, potassium 4.9, AST 22, ALT 32, cholesterol 95, triglycerides 69, HDL 43, LDL 38, hemoglobin A1c 7.6%  Other Studies Reviewed Today:  Echocardiogram 02/25/2019:  1. The left ventricle has normal systolic function with an ejection fraction of 60-65%. The cavity size was normal. There is mild concentric left ventricular hypertrophy. Left ventricular diastolic Doppler parameters are consistent with impaired  relaxation. Indeterminate  filling pressures No evidence of left ventricular regional wall motion abnormalities.  2. The right ventricle has normal systolic function. The cavity was normal. There is no increase in right ventricular wall thickness.  3. Left atrial size was mildly dilated.  4. The mitral valve is grossly normal.  5. The tricuspid valve is grossly normal.  6. The aortic valve is tricuspid. Moderate thickening of the aortic valve. Moderate calcification of the aortic valve. Aortic valve regurgitation is trivial by color flow Doppler. Moderate stenosis of the aortic valve.  7. Moderate aortic annular calcification.  8. The aortic root is normal in size and structure.  Assessment and Plan:  1.  Essential hypertension, not yet optimally controlled despite escalating dose of hydralazine and consistent use of Lopressor.  Plan to add Norvasc 2.5 mg daily.  2.  History of hyperkalemia on ARB.  He has continued on [facility], although has not seen a nephrologist as yet.  Rechecking BMET today.  3.  Paroxysmal atrial fibrillation, no reported palpitations.  He continues on beta-blocker for heart rate control and Eliquis most recently at 2.5 mg twice daily in light of fluctuating renal function and age.   Medication Adjustments/Labs and Tests Ordered: Current medicines are reviewed at length with the patient today.  Concerns regarding medicines are outlined above.   Tests Ordered: Orders Placed This Encounter  Procedures  . Basic Metabolic Panel (BMET)     Medication Changes: Meds ordered this encounter  Medications  . amLODipine (NORVASC) 2.5 MG tablet    Sig: Take 1 tablet (2.5 mg total) by mouth daily.    Dispense:  90 tablet    Refill:  3    Disposition:  Follow up 4 weeks in the [place] office with me or [place].  Signed, Satira Sark, MD, [facility] 07/27/2019 2:16 PM    [facility] at [facility] [address] [place], [place] [address] Phone: [phone]; Fax: [phone]

## 2019-07-28 ENCOUNTER — Telehealth: Payer: Self-pay | Admitting: *Deleted

## 2019-07-28 DIAGNOSIS — E119 Type 2 diabetes mellitus without complications: Secondary | ICD-10-CM | POA: Diagnosis not present

## 2019-07-28 NOTE — Telephone Encounter (Signed)
Called patient with test results. No answer. Left message to call back.  

## 2019-07-28 NOTE — Telephone Encounter (Signed)
-----   Message from Satira Sark, MD sent at 07/27/2019  3:17 PM EDT ----- Results reviewed. Potassium looks better at 4.8 but creatinine up to 1.52. I would suggest he go ahead and arrange a visit with nephrology.

## 2019-08-02 ENCOUNTER — Telehealth: Payer: Self-pay | Admitting: Diagnostic Neuroimaging

## 2019-08-02 NOTE — Telephone Encounter (Signed)
Pt is being referred back to our office for spasticity. He is a returning patient of yours and was last seen 2019. Please advise how you would like him to be scheduled. Thank you

## 2019-08-02 NOTE — Telephone Encounter (Signed)
Ok to setup follow up appt. -VRP

## 2019-08-10 DIAGNOSIS — R809 Proteinuria, unspecified: Secondary | ICD-10-CM | POA: Diagnosis not present

## 2019-08-10 DIAGNOSIS — D638 Anemia in other chronic diseases classified elsewhere: Secondary | ICD-10-CM | POA: Diagnosis not present

## 2019-08-10 DIAGNOSIS — I129 Hypertensive chronic kidney disease with stage 1 through stage 4 chronic kidney disease, or unspecified chronic kidney disease: Secondary | ICD-10-CM | POA: Diagnosis not present

## 2019-08-10 DIAGNOSIS — E1129 Type 2 diabetes mellitus with other diabetic kidney complication: Secondary | ICD-10-CM | POA: Diagnosis not present

## 2019-08-10 DIAGNOSIS — E875 Hyperkalemia: Secondary | ICD-10-CM | POA: Diagnosis not present

## 2019-08-10 DIAGNOSIS — N189 Chronic kidney disease, unspecified: Secondary | ICD-10-CM | POA: Diagnosis not present

## 2019-08-10 DIAGNOSIS — E1122 Type 2 diabetes mellitus with diabetic chronic kidney disease: Secondary | ICD-10-CM | POA: Diagnosis not present

## 2019-08-10 DIAGNOSIS — R600 Localized edema: Secondary | ICD-10-CM | POA: Diagnosis not present

## 2019-08-15 ENCOUNTER — Other Ambulatory Visit (HOSPITAL_COMMUNITY): Payer: Self-pay | Admitting: Nephrology

## 2019-08-15 ENCOUNTER — Other Ambulatory Visit: Payer: Self-pay | Admitting: Nephrology

## 2019-08-15 DIAGNOSIS — E1122 Type 2 diabetes mellitus with diabetic chronic kidney disease: Secondary | ICD-10-CM

## 2019-08-15 DIAGNOSIS — N189 Chronic kidney disease, unspecified: Secondary | ICD-10-CM

## 2019-08-16 DIAGNOSIS — E1142 Type 2 diabetes mellitus with diabetic polyneuropathy: Secondary | ICD-10-CM | POA: Diagnosis not present

## 2019-08-16 DIAGNOSIS — B351 Tinea unguium: Secondary | ICD-10-CM | POA: Diagnosis not present

## 2019-08-17 DIAGNOSIS — I482 Chronic atrial fibrillation, unspecified: Secondary | ICD-10-CM | POA: Diagnosis not present

## 2019-08-17 DIAGNOSIS — I10 Essential (primary) hypertension: Secondary | ICD-10-CM | POA: Diagnosis not present

## 2019-08-17 DIAGNOSIS — E1122 Type 2 diabetes mellitus with diabetic chronic kidney disease: Secondary | ICD-10-CM | POA: Diagnosis not present

## 2019-08-17 DIAGNOSIS — E782 Mixed hyperlipidemia: Secondary | ICD-10-CM | POA: Diagnosis not present

## 2019-08-17 DIAGNOSIS — E875 Hyperkalemia: Secondary | ICD-10-CM | POA: Diagnosis not present

## 2019-08-17 DIAGNOSIS — K900 Celiac disease: Secondary | ICD-10-CM | POA: Diagnosis not present

## 2019-08-19 ENCOUNTER — Ambulatory Visit (HOSPITAL_COMMUNITY)
Admission: RE | Admit: 2019-08-19 | Discharge: 2019-08-19 | Disposition: A | Discharge status: Home / Self Care | Payer: PPO | Source: Ambulatory Visit | Attending: Nephrology | Admitting: Nephrology

## 2019-08-19 ENCOUNTER — Other Ambulatory Visit: Payer: Self-pay

## 2019-08-19 DIAGNOSIS — N189 Chronic kidney disease, unspecified: Secondary | ICD-10-CM | POA: Insufficient documentation

## 2019-08-19 DIAGNOSIS — E1122 Type 2 diabetes mellitus with diabetic chronic kidney disease: Secondary | ICD-10-CM | POA: Diagnosis not present

## 2019-08-19 DIAGNOSIS — N181 Chronic kidney disease, stage 1: Secondary | ICD-10-CM | POA: Insufficient documentation

## 2019-08-22 DIAGNOSIS — E1140 Type 2 diabetes mellitus with diabetic neuropathy, unspecified: Secondary | ICD-10-CM | POA: Diagnosis not present

## 2019-08-22 DIAGNOSIS — D649 Anemia, unspecified: Secondary | ICD-10-CM | POA: Diagnosis not present

## 2019-08-22 DIAGNOSIS — E782 Mixed hyperlipidemia: Secondary | ICD-10-CM | POA: Diagnosis not present

## 2019-08-22 DIAGNOSIS — N183 Chronic kidney disease, stage 3 unspecified: Secondary | ICD-10-CM | POA: Diagnosis not present

## 2019-08-22 DIAGNOSIS — E1122 Type 2 diabetes mellitus with diabetic chronic kidney disease: Secondary | ICD-10-CM | POA: Diagnosis not present

## 2019-08-22 DIAGNOSIS — D631 Anemia in chronic kidney disease: Secondary | ICD-10-CM | POA: Diagnosis not present

## 2019-08-22 DIAGNOSIS — D509 Iron deficiency anemia, unspecified: Secondary | ICD-10-CM | POA: Diagnosis not present

## 2019-08-22 DIAGNOSIS — E1142 Type 2 diabetes mellitus with diabetic polyneuropathy: Secondary | ICD-10-CM | POA: Diagnosis not present

## 2019-08-24 DIAGNOSIS — E1122 Type 2 diabetes mellitus with diabetic chronic kidney disease: Secondary | ICD-10-CM | POA: Diagnosis not present

## 2019-08-24 DIAGNOSIS — E1129 Type 2 diabetes mellitus with other diabetic kidney complication: Secondary | ICD-10-CM | POA: Diagnosis not present

## 2019-08-24 DIAGNOSIS — R809 Proteinuria, unspecified: Secondary | ICD-10-CM | POA: Diagnosis not present

## 2019-08-24 DIAGNOSIS — I129 Hypertensive chronic kidney disease with stage 1 through stage 4 chronic kidney disease, or unspecified chronic kidney disease: Secondary | ICD-10-CM | POA: Diagnosis not present

## 2019-08-24 DIAGNOSIS — E875 Hyperkalemia: Secondary | ICD-10-CM | POA: Diagnosis not present

## 2019-08-24 DIAGNOSIS — D638 Anemia in other chronic diseases classified elsewhere: Secondary | ICD-10-CM | POA: Diagnosis not present

## 2019-08-25 DIAGNOSIS — E875 Hyperkalemia: Secondary | ICD-10-CM | POA: Diagnosis not present

## 2019-08-25 DIAGNOSIS — D631 Anemia in chronic kidney disease: Secondary | ICD-10-CM | POA: Diagnosis not present

## 2019-08-25 DIAGNOSIS — I69351 Hemiplegia and hemiparesis following cerebral infarction affecting right dominant side: Secondary | ICD-10-CM | POA: Diagnosis not present

## 2019-08-25 DIAGNOSIS — I10 Essential (primary) hypertension: Secondary | ICD-10-CM | POA: Diagnosis not present

## 2019-08-25 DIAGNOSIS — I639 Cerebral infarction, unspecified: Secondary | ICD-10-CM | POA: Diagnosis not present

## 2019-08-25 DIAGNOSIS — E039 Hypothyroidism, unspecified: Secondary | ICD-10-CM | POA: Diagnosis not present

## 2019-08-25 DIAGNOSIS — I129 Hypertensive chronic kidney disease with stage 1 through stage 4 chronic kidney disease, or unspecified chronic kidney disease: Secondary | ICD-10-CM | POA: Diagnosis not present

## 2019-08-25 DIAGNOSIS — E1140 Type 2 diabetes mellitus with diabetic neuropathy, unspecified: Secondary | ICD-10-CM | POA: Diagnosis not present

## 2019-08-25 DIAGNOSIS — I4891 Unspecified atrial fibrillation: Secondary | ICD-10-CM | POA: Diagnosis not present

## 2019-08-25 DIAGNOSIS — F5101 Primary insomnia: Secondary | ICD-10-CM | POA: Diagnosis not present

## 2019-08-25 DIAGNOSIS — K900 Celiac disease: Secondary | ICD-10-CM | POA: Diagnosis not present

## 2019-08-25 DIAGNOSIS — E782 Mixed hyperlipidemia: Secondary | ICD-10-CM | POA: Diagnosis not present

## 2019-09-01 DIAGNOSIS — I129 Hypertensive chronic kidney disease with stage 1 through stage 4 chronic kidney disease, or unspecified chronic kidney disease: Secondary | ICD-10-CM | POA: Diagnosis not present

## 2019-09-01 DIAGNOSIS — R809 Proteinuria, unspecified: Secondary | ICD-10-CM | POA: Diagnosis not present

## 2019-09-01 DIAGNOSIS — N189 Chronic kidney disease, unspecified: Secondary | ICD-10-CM | POA: Diagnosis not present

## 2019-09-01 DIAGNOSIS — E1129 Type 2 diabetes mellitus with other diabetic kidney complication: Secondary | ICD-10-CM | POA: Diagnosis not present

## 2019-09-01 DIAGNOSIS — E211 Secondary hyperparathyroidism, not elsewhere classified: Secondary | ICD-10-CM | POA: Diagnosis not present

## 2019-09-01 DIAGNOSIS — E1122 Type 2 diabetes mellitus with diabetic chronic kidney disease: Secondary | ICD-10-CM | POA: Diagnosis not present

## 2019-09-01 DIAGNOSIS — D638 Anemia in other chronic diseases classified elsewhere: Secondary | ICD-10-CM | POA: Diagnosis not present

## 2019-09-01 DIAGNOSIS — R600 Localized edema: Secondary | ICD-10-CM | POA: Diagnosis not present

## 2019-09-01 DIAGNOSIS — E875 Hyperkalemia: Secondary | ICD-10-CM | POA: Diagnosis not present

## 2019-09-01 DIAGNOSIS — N1832 Chronic kidney disease, stage 3b: Secondary | ICD-10-CM | POA: Diagnosis not present

## 2019-09-07 ENCOUNTER — Other Ambulatory Visit: Payer: Self-pay

## 2019-09-14 ENCOUNTER — Institutional Professional Consult (permissible substitution): Payer: PPO | Admitting: Diagnostic Neuroimaging

## 2019-09-15 DIAGNOSIS — E119 Type 2 diabetes mellitus without complications: Secondary | ICD-10-CM | POA: Diagnosis not present

## 2019-09-22 DIAGNOSIS — R809 Proteinuria, unspecified: Secondary | ICD-10-CM | POA: Diagnosis not present

## 2019-09-22 DIAGNOSIS — N183 Chronic kidney disease, stage 3 unspecified: Secondary | ICD-10-CM | POA: Diagnosis not present

## 2019-09-22 DIAGNOSIS — K900 Celiac disease: Secondary | ICD-10-CM | POA: Diagnosis not present

## 2019-09-22 DIAGNOSIS — N189 Chronic kidney disease, unspecified: Secondary | ICD-10-CM | POA: Diagnosis not present

## 2019-09-22 DIAGNOSIS — F5101 Primary insomnia: Secondary | ICD-10-CM | POA: Diagnosis not present

## 2019-09-22 DIAGNOSIS — E211 Secondary hyperparathyroidism, not elsewhere classified: Secondary | ICD-10-CM | POA: Diagnosis not present

## 2019-09-22 DIAGNOSIS — I10 Essential (primary) hypertension: Secondary | ICD-10-CM | POA: Diagnosis not present

## 2019-09-22 DIAGNOSIS — I639 Cerebral infarction, unspecified: Secondary | ICD-10-CM | POA: Diagnosis not present

## 2019-09-22 DIAGNOSIS — E039 Hypothyroidism, unspecified: Secondary | ICD-10-CM | POA: Diagnosis not present

## 2019-09-22 DIAGNOSIS — E1140 Type 2 diabetes mellitus with diabetic neuropathy, unspecified: Secondary | ICD-10-CM | POA: Diagnosis not present

## 2019-09-22 DIAGNOSIS — N1832 Chronic kidney disease, stage 3b: Secondary | ICD-10-CM | POA: Diagnosis not present

## 2019-09-22 DIAGNOSIS — I129 Hypertensive chronic kidney disease with stage 1 through stage 4 chronic kidney disease, or unspecified chronic kidney disease: Secondary | ICD-10-CM | POA: Diagnosis not present

## 2019-09-22 DIAGNOSIS — E875 Hyperkalemia: Secondary | ICD-10-CM | POA: Diagnosis not present

## 2019-09-22 DIAGNOSIS — R600 Localized edema: Secondary | ICD-10-CM | POA: Diagnosis not present

## 2019-09-22 DIAGNOSIS — I4891 Unspecified atrial fibrillation: Secondary | ICD-10-CM | POA: Diagnosis not present

## 2019-09-22 DIAGNOSIS — E1122 Type 2 diabetes mellitus with diabetic chronic kidney disease: Secondary | ICD-10-CM | POA: Diagnosis not present

## 2019-09-22 DIAGNOSIS — E782 Mixed hyperlipidemia: Secondary | ICD-10-CM | POA: Diagnosis not present

## 2019-09-22 DIAGNOSIS — D631 Anemia in chronic kidney disease: Secondary | ICD-10-CM | POA: Diagnosis not present

## 2019-09-22 DIAGNOSIS — E1129 Type 2 diabetes mellitus with other diabetic kidney complication: Secondary | ICD-10-CM | POA: Diagnosis not present

## 2019-09-22 DIAGNOSIS — D638 Anemia in other chronic diseases classified elsewhere: Secondary | ICD-10-CM | POA: Diagnosis not present

## 2019-09-29 DIAGNOSIS — D638 Anemia in other chronic diseases classified elsewhere: Secondary | ICD-10-CM | POA: Diagnosis not present

## 2019-09-29 DIAGNOSIS — I129 Hypertensive chronic kidney disease with stage 1 through stage 4 chronic kidney disease, or unspecified chronic kidney disease: Secondary | ICD-10-CM | POA: Diagnosis not present

## 2019-09-29 DIAGNOSIS — E1129 Type 2 diabetes mellitus with other diabetic kidney complication: Secondary | ICD-10-CM | POA: Diagnosis not present

## 2019-09-29 DIAGNOSIS — E211 Secondary hyperparathyroidism, not elsewhere classified: Secondary | ICD-10-CM | POA: Diagnosis not present

## 2019-09-29 DIAGNOSIS — R600 Localized edema: Secondary | ICD-10-CM | POA: Diagnosis not present

## 2019-09-29 DIAGNOSIS — N189 Chronic kidney disease, unspecified: Secondary | ICD-10-CM | POA: Diagnosis not present

## 2019-09-29 DIAGNOSIS — E1122 Type 2 diabetes mellitus with diabetic chronic kidney disease: Secondary | ICD-10-CM | POA: Diagnosis not present

## 2019-09-29 DIAGNOSIS — E875 Hyperkalemia: Secondary | ICD-10-CM | POA: Diagnosis not present

## 2019-09-29 DIAGNOSIS — R809 Proteinuria, unspecified: Secondary | ICD-10-CM | POA: Diagnosis not present

## 2019-10-03 DIAGNOSIS — R600 Localized edema: Secondary | ICD-10-CM | POA: Diagnosis not present

## 2019-10-03 DIAGNOSIS — E1129 Type 2 diabetes mellitus with other diabetic kidney complication: Secondary | ICD-10-CM | POA: Diagnosis not present

## 2019-10-03 DIAGNOSIS — E875 Hyperkalemia: Secondary | ICD-10-CM | POA: Diagnosis not present

## 2019-10-03 DIAGNOSIS — E211 Secondary hyperparathyroidism, not elsewhere classified: Secondary | ICD-10-CM | POA: Diagnosis not present

## 2019-10-03 DIAGNOSIS — N189 Chronic kidney disease, unspecified: Secondary | ICD-10-CM | POA: Diagnosis not present

## 2019-10-03 DIAGNOSIS — I129 Hypertensive chronic kidney disease with stage 1 through stage 4 chronic kidney disease, or unspecified chronic kidney disease: Secondary | ICD-10-CM | POA: Diagnosis not present

## 2019-10-03 DIAGNOSIS — R809 Proteinuria, unspecified: Secondary | ICD-10-CM | POA: Diagnosis not present

## 2019-10-03 DIAGNOSIS — D638 Anemia in other chronic diseases classified elsewhere: Secondary | ICD-10-CM | POA: Diagnosis not present

## 2019-10-03 DIAGNOSIS — E1122 Type 2 diabetes mellitus with diabetic chronic kidney disease: Secondary | ICD-10-CM | POA: Diagnosis not present

## 2019-10-04 DIAGNOSIS — N189 Chronic kidney disease, unspecified: Secondary | ICD-10-CM | POA: Diagnosis not present

## 2019-10-04 DIAGNOSIS — R809 Proteinuria, unspecified: Secondary | ICD-10-CM | POA: Diagnosis not present

## 2019-10-04 DIAGNOSIS — E1129 Type 2 diabetes mellitus with other diabetic kidney complication: Secondary | ICD-10-CM | POA: Diagnosis not present

## 2019-10-04 DIAGNOSIS — E875 Hyperkalemia: Secondary | ICD-10-CM | POA: Diagnosis not present

## 2019-10-04 DIAGNOSIS — R600 Localized edema: Secondary | ICD-10-CM | POA: Diagnosis not present

## 2019-10-04 DIAGNOSIS — E211 Secondary hyperparathyroidism, not elsewhere classified: Secondary | ICD-10-CM | POA: Diagnosis not present

## 2019-10-04 DIAGNOSIS — E1122 Type 2 diabetes mellitus with diabetic chronic kidney disease: Secondary | ICD-10-CM | POA: Diagnosis not present

## 2019-10-17 DIAGNOSIS — R809 Proteinuria, unspecified: Secondary | ICD-10-CM | POA: Diagnosis not present

## 2019-10-17 DIAGNOSIS — E1129 Type 2 diabetes mellitus with other diabetic kidney complication: Secondary | ICD-10-CM | POA: Diagnosis not present

## 2019-10-17 DIAGNOSIS — R600 Localized edema: Secondary | ICD-10-CM | POA: Diagnosis not present

## 2019-10-17 DIAGNOSIS — E211 Secondary hyperparathyroidism, not elsewhere classified: Secondary | ICD-10-CM | POA: Diagnosis not present

## 2019-10-17 DIAGNOSIS — N189 Chronic kidney disease, unspecified: Secondary | ICD-10-CM | POA: Diagnosis not present

## 2019-10-17 DIAGNOSIS — E1122 Type 2 diabetes mellitus with diabetic chronic kidney disease: Secondary | ICD-10-CM | POA: Diagnosis not present

## 2019-10-17 DIAGNOSIS — E875 Hyperkalemia: Secondary | ICD-10-CM | POA: Diagnosis not present

## 2019-10-19 DIAGNOSIS — D638 Anemia in other chronic diseases classified elsewhere: Secondary | ICD-10-CM | POA: Diagnosis not present

## 2019-10-19 DIAGNOSIS — N189 Chronic kidney disease, unspecified: Secondary | ICD-10-CM | POA: Diagnosis not present

## 2019-10-19 DIAGNOSIS — I129 Hypertensive chronic kidney disease with stage 1 through stage 4 chronic kidney disease, or unspecified chronic kidney disease: Secondary | ICD-10-CM | POA: Diagnosis not present

## 2019-10-19 DIAGNOSIS — E1129 Type 2 diabetes mellitus with other diabetic kidney complication: Secondary | ICD-10-CM | POA: Diagnosis not present

## 2019-10-19 DIAGNOSIS — R600 Localized edema: Secondary | ICD-10-CM | POA: Diagnosis not present

## 2019-10-19 DIAGNOSIS — E1122 Type 2 diabetes mellitus with diabetic chronic kidney disease: Secondary | ICD-10-CM | POA: Diagnosis not present

## 2019-10-19 DIAGNOSIS — E870 Hyperosmolality and hypernatremia: Secondary | ICD-10-CM | POA: Diagnosis not present

## 2019-10-19 DIAGNOSIS — E211 Secondary hyperparathyroidism, not elsewhere classified: Secondary | ICD-10-CM | POA: Diagnosis not present

## 2019-10-19 DIAGNOSIS — E875 Hyperkalemia: Secondary | ICD-10-CM | POA: Diagnosis not present

## 2019-10-19 DIAGNOSIS — R809 Proteinuria, unspecified: Secondary | ICD-10-CM | POA: Diagnosis not present

## 2019-11-01 ENCOUNTER — Encounter: Payer: Self-pay | Admitting: *Deleted

## 2019-11-01 ENCOUNTER — Ambulatory Visit: Payer: PPO | Admitting: Diagnostic Neuroimaging

## 2019-11-01 ENCOUNTER — Other Ambulatory Visit: Payer: Self-pay

## 2019-11-01 ENCOUNTER — Encounter: Payer: Self-pay | Admitting: Diagnostic Neuroimaging

## 2019-11-01 ENCOUNTER — Other Ambulatory Visit: Payer: Self-pay | Admitting: *Deleted

## 2019-11-01 VITALS — BP 149/54 | HR 70 | Temp 98.0°F | Ht 70.0 in | Wt 218.6 lb

## 2019-11-01 DIAGNOSIS — G609 Hereditary and idiopathic neuropathy, unspecified: Secondary | ICD-10-CM | POA: Diagnosis not present

## 2019-11-01 DIAGNOSIS — I63512 Cerebral infarction due to unspecified occlusion or stenosis of left middle cerebral artery: Secondary | ICD-10-CM | POA: Diagnosis not present

## 2019-11-01 NOTE — Progress Notes (Signed)
[facility]  PATIENT: [name] DOB: 30-Dec-1928  REFERRING CLINICIAN: stroke follow up HISTORY FROM: patient, son REASON FOR VISIT: new consult    HISTORICAL  CHIEF COMPLAINT:  Chief Complaint  Patient presents with  . Spasticity    rm 9 son- [name] "having muscle spasms primarily during the night or early am; began middle of Oct"    HISTORY OF PRESENT ILLNESS:   UPDATE (11/01/19, VRP): Since last visit, now having muscle spasms in arms and legs, mainly at nighttime; since Oct 2020. No LOC or confusion. Affects whole body. No alleviating or aggravating factors. Tolerating meds. Now on tizanidine at 10pm which helps a little bit.   PRIOR HPI (12/01/17): 86 year old male here for evaluation of stroke.  Patient was admitted to the hospital in December 2018 for left MCA ischemic infarction, secondary to atrial fibrillation, patient was not on anticoagulation at that time due to history of GI bleeding and fall risk.  Patient had resultant mixed aphasia.  Patient doing better now.  Patient tolerating medications.  Patient having some muscle aches and pains, and is concerned that Lipitor may be causing this.  Patient was previously on red yeast rice prior to stroke.  Patient's blood pressure has been fairly stable but slightly elevated.  One of his blood pressure medication was held during his acute stroke, and family is asking about restarting this.  Patient also asking about safety for driving.  Patient's family is concerned about his ability to drive safely.   REVIEW OF SYSTEMS: Full 14 system review of systems performed and negative with exception of: Fatigue memory loss confusion sleepiness feeling cold aching muscles decreased energy.  ALLERGIES: Allergies  Allergen Reactions  . Clindamycin Diarrhea  . Gluten Meal Diarrhea    No rye; no barley = HAS CELIAC DISEASE  . Hydrocodone Other (See Comments)    Caused mental status changes and suffered  withdrawals when he stopped it  . Penicillins Other (See Comments)    From childhood: Has patient had a PCN reaction causing immediate rash, facial/tongue/throat swelling, SOB or lightheadedness with hypotension: Unk Has patient had a PCN reaction causing severe rash involving mucus membranes or skin necrosis: Unk Has patient had a PCN reaction that required hospitalization: Unk Has patient had a PCN reaction occurring within the last 10 years: No If all of the above answers are "NO", then may proceed with Cephalosporin use.   . Procaine Other (See Comments)    "Passes out"  . Sulfonamide Derivatives Hives  . Tape Other (See Comments)    PLEASE USE AN ALTERNATIVE; LIKE COBAN WRAP; TAPE TEARS THE SKIN AND CAUSES BLISTERS!! PLEASE USE AN ALTERNATIVE; LIKE COBAN WRAP; TAPE TEARS THE SKIN AND CAUSES BLISTERS!!  . Wheat Bran Diarrhea    Pt family states pt has celiac disease  . Cephalexin Diarrhea and Nausea And Vomiting    Dry hives, "violent" diarhea  . Mold Extract [Trichophyton] Other (See Comments)    Stuffiness, post-nasal drip    HOME MEDICATIONS: Outpatient Medications Prior to Visit  Medication Sig Dispense Refill  . acetaminophen (TYLENOL) 325 MG tablet Take 650 mg by mouth every 6 (six) hours as needed. Takes 2 once daily    . amitriptyline (ELAVIL) 50 MG tablet Take 50 mg by mouth at bedtime.    Marland Kitchen apixaban (ELIQUIS) 2.5 MG TABS tablet Take 1 tablet (2.5 mg total) by mouth 2 (two) times daily. 60 tablet 6  . Ascorbic Acid (VITAMIN C) 1000 MG tablet Take  1,000 mg by mouth 2 (two) times daily.      Marland Kitchen atorvastatin (LIPITOR) 40 MG tablet Take 40 mg by mouth daily.    Marland Kitchen azelastine (ASTELIN) 0.1 % nasal spray Place 1 spray into both nostrils at bedtime.     . Biotin 10 MG CAPS Take 10 mg by mouth daily.    . Cholecalciferol (VITAMIN D3) 1000 UNITS CAPS Take 3,000 Units by mouth 3 (three) times daily.     . diclofenac sodium (VOLTAREN) 1 % GEL Apply 2 g topically 4 (four) times  daily. 1 Tube 0  . Fexofenadine HCl (MUCINEX ALLERGY PO) Take 1 tablet by mouth daily.    . fluticasone (FLONASE) 50 MCG/ACT nasal spray Place 1 spray into both nostrils daily.     . furosemide (LASIX) 20 MG tablet Take 20 mg by mouth 2 (two) times daily.    . Ginger, Zingiber officinalis, (GINGER ROOT) 550 MG CAPS Take by mouth. 2 daily    . glipiZIDE (GLUCOTROL XL) 10 MG 24 hr tablet 10 mg.    . JANUVIA 100 MG tablet 100 mg daily. morning    . levocetirizine (XYZAL) 5 MG tablet Take 5 mg by mouth at bedtime.     Marland Kitchen levothyroxine (SYNTHROID, LEVOTHROID) 150 MCG tablet Take 1 tablet (150 mcg total) by mouth daily before breakfast. 30 tablet 0  . MAGNESIUM MALATE PO Take 500 mg by mouth daily.    . Melatonin 3 MG TABS Take 3 mg by mouth at bedtime.     . metoprolol tartrate (LOPRESSOR) 25 MG tablet Take 75 mg by mouth 2 (two) times daily.    . Misc Natural Products (TART CHERRY ADVANCED PO) Take 425 mg by mouth. 2 daily    . pantoprazole (PROTONIX) 40 MG tablet Take 1 tablet (40 mg total) by mouth daily. 30 tablet 0  . polyethylene glycol (MIRALAX / GLYCOLAX) 17 g packet Take 17 g by mouth as needed.    Marland Kitchen tiZANidine (ZANAFLEX) 2 MG tablet     . LOKELMA 10 g PACK packet Take 10 g by mouth daily.     Marland Kitchen amLODipine (NORVASC) 2.5 MG tablet Take 1 tablet (2.5 mg total) by mouth daily. 90 tablet 3  . hydrALAZINE (APRESOLINE) 100 MG tablet Take 1 tablet (100 mg total) by mouth 3 (three) times daily. (Patient not taking: Reported on 11/01/2019) 180 tablet 3  . Multiple Vitamin (MULTIVITAMIN) tablet Take 1 tablet by mouth daily.      . Omega-3 Fatty Acids (FISH OIL) 1000 MG CAPS Take 1 capsule by mouth daily.     No facility-administered medications prior to visit.    PAST MEDICAL HISTORY: Past Medical History:  Diagnosis Date  . Allergic rhinitis   . Asthmatic bronchitis   . Atrial fibrillation ([place])   . Celiac disease   . Cellulitis   . Diabetes ([place])   . Diverticulosis   . Fx. left wrist  1987  . Gastric polyps   . GERD (gastroesophageal reflux disease)   . History of pneumonia   . Hypertension   . Hypothyroidism   . Iron deficiency anemia   . Melanoma ([place])   . Neuropathy   . PAF (paroxysmal atrial fibrillation) ([place])   . Prostate cancer ([place])   . REM sleep behavior disorder   . RLS (restless legs syndrome)   . Sleep apnea   . Spasticity   . Stroke ([place]) 09/2017  . Tremor   . Type 2 diabetes mellitus ([place])  PAST SURGICAL HISTORY: Past Surgical History:  Procedure Laterality Date  . APPENDECTOMY  1978  . CARPAL TUNNEL RELEASE Left   . CATARACT EXTRACTION  03/2011   bilateral   . CHOLECYSTECTOMY  2001  . COLONOSCOPY W/ BIOPSIES  04/27/2008   diverticulosis, duodenitis  . FOOT SURGERY    . HERNIA REPAIR  1994   bilateral  . KNEE SURGERY Bilateral    x 2  . LUNG SURGERY  2001   for infection  . PROSTATECTOMY  2002  . UPPER GASTROINTESTINAL ENDOSCOPY  04/27/2008   celiac disease, gastric polyps    FAMILY HISTORY: Family History  Problem Relation Age of Onset  . Breast cancer Mother   . Kidney failure Father   . Heart disease Father   . Rheumatic fever Father   . Colon cancer Other     SOCIAL HISTORY:  Social History   Socioeconomic History  . Marital status: Married    Spouse name: [name]  . Number of children: 3  . Years of education: college degree  . Highest education level: Bachelor's degree (e.g., BA, AB, BS)  Occupational History  . Occupation: Retired    Comment: Theme park manager  Tobacco Use  . Smoking status: Never Smoker  . Smokeless tobacco: Never Used  Substance and Sexual Activity  . Alcohol use: Never  . Drug use: Never  . Sexual activity: Never    Birth control/protection: Abstinence  Other Topics Concern  . 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 Difficulty of  Paying Living Expenses: Not on file  Food Insecurity:   . Worried About Charity fundraiser in the Last Year: Not on file  . Ran Out of Food in the Last Year: Not on file  Transportation Needs:   . Lack of Transportation (Medical): Not on file  . Lack of Transportation (Non-Medical): Not on file  Physical Activity:   . Days of Exercise per Week: Not on file  . Minutes of Exercise per Session: Not on file  Stress:   . Feeling of Stress : Not on file  Social Connections:   . Frequency of Communication with Friends and Family: Not on file  . Frequency of Social Gatherings with Friends and Family: Not on file  . Attends Religious Services: Not on file  . Active Member of Clubs or Organizations: Not on file  . Attends Archivist Meetings: Not on file  . Marital Status: Not on file  Intimate Partner Violence:   . Fear of Current or Ex-Partner: Not on file  . Emotionally Abused: Not on file  . Physically Abused: Not on file  . Sexually Abused: Not on file     PHYSICAL EXAM  GENERAL EXAM/CONSTITUTIONAL: Vitals:  Vitals:   11/01/19 1507  BP: (!) 149/54  Pulse: 70  Temp: 98 F (36.7 C)  Weight: 218 lb 9.6 oz (99.2 kg)  Height: 5\' 10"  (1.778 m)   Body mass index is 31.37 kg/m. No exam data present  Patient is in no distress; well developed, nourished and groomed; neck is supple  CARDIOVASCULAR:  Examination of carotid arteries is normal; no carotid bruits  Regular rate and rhythm, no murmurs  Examination of peripheral vascular system by observation and palpation is normal  EYES:  Ophthalmoscopic exam of optic discs and posterior  segments is normal; no papilledema or hemorrhages  MUSCULOSKELETAL:  Gait, strength, tone, movements noted in Neurologic exam below  NEUROLOGIC: MENTAL STATUS:  No flowsheet data found.  awake, alert, oriented to person, place and time  recent and remote memory intact  normal attention and concentration  language fluent,  comprehension intact, naming intact,   fund of knowledge appropriate  CRANIAL NERVE:   2nd - no papilledema on fundoscopic exam  2nd, 3rd, 4th, 6th - pupils equal and reactive to light, visual fields full to confrontation, extraocular muscles intact, no nystagmus  5th - facial sensation symmetric  7th - facial strength symmetric  8th - hearing intact  9th - palate elevates symmetrically, uvula midline  11th - shoulder shrug symmetric  12th - tongue protrusion midline  MOTOR:   normal bulk and tone, full strength in the BUE, BLE EXCEPT BILATERAL FOOT DROP 2-3  SENSORY:   normal and symmetric to light touch; ABSENT VIB AT ANKLES AND TOES  COORDINATION:   finger-nose-finger, fine finger movements normal  REFLEXES:   deep tendon reflexes TRACE and symmetric; ABSENT IN BLE  GAIT/STATION:   WIDE BASED GAIT; USING WALKER; UNSTEADY    DIAGNOSTIC DATA (LABS, IMAGING, TESTING) - I reviewed patient records, labs, notes, testing and imaging myself where available.  Lab Results  Component Value Date   WBC 9.9 10/08/2017   HGB 12.6 (L) 10/08/2017   HCT 38.2 (L) 10/08/2017   MCV 96.7 10/08/2017   PLT 309 10/08/2017      Component Value Date/Time   NA 138 07/27/2019 1430   NA 141 05/15/2017 1506   K 4.8 07/27/2019 1430   CL 103 07/27/2019 1430   CO2 27 07/27/2019 1430   GLUCOSE 179 (H) 07/27/2019 1430   BUN 37 (H) 07/27/2019 1430   BUN 37 (H) 05/15/2017 1506   CREATININE 1.52 (H) 07/27/2019 1430   CREATININE 1.54 (H) 03/12/2017 1148   CALCIUM 8.9 07/27/2019 1430   PROT 7.0 10/08/2017 0848   ALBUMIN 3.3 (L) 10/08/2017 0848   AST 37 10/08/2017 0848   ALT 33 10/08/2017 0848   ALKPHOS 87 10/08/2017 0848   BILITOT 0.8 10/08/2017 0848   GFRNONAA 40 (L) 07/27/2019 1430   GFRNONAA 41 (L) 03/12/2017 1148   GFRAA 47 (L) 07/27/2019 1430   GFRAA 47 (L) 03/12/2017 1148   Lab Results  Component Value Date   CHOL 160 10/03/2017   HDL 37 (L) 10/03/2017   LDLCALC 90  10/03/2017   TRIG 164 (H) 10/03/2017   CHOLHDL 4.3 10/03/2017   Lab Results  Component Value Date   HGBA1C 7.8 (H) 10/03/2017   Lab Results  Component Value Date   IONGEXBM84 132 (H) 03/24/2008   Lab Results  Component Value Date   TSH 10.604 (H) 10/07/2017    10/03/17 MRI brain [I reviewed images myself and agree with interpretation. -VRP]  - Acute LEFT MCA territory infarct, primarily temporal lobe involvement, likely a single LEFT M3 branch, cannot be visualized even in retrospect on yesterday's imaging studies. - Atrophy and small vessel disease. - Consistent with history of atrial fibrillation, this is likely an embolic stroke, with evidence for reperfusion on gradient sequence today. Some coalescence posteriorly. Baseline noncontrast CT of the head recommended for follow-up. A call has been placed to the ordering provider.     ASSESSMENT AND PLAN  86 y.o. year old male here with atrial fibrillation and left MCA stroke, now on anticoagulation.  Patient doing well.  Dx:  1. Idiopathic  neuropathy   2. Left middle cerebral artery stroke (HCC)      PLAN:  IDIOPATHIC NEUROPATHY (with restless leg syndrome and muscle spasm) - continue tizanidine 2mg  at bedtime; may increase to 4mg  at bedtime - may consider gabapentin 100-300mg  at bedtime  LEFT MCA STROKE - continue eliquis - continue statin, BP control, diabetes control - no driving (due to muscle weakness in feet, neuropathy)  Return for pending if symptoms worsen or fail to improve, return to PCP.    Penni Bombard, MD 2/66/6648, 6:16 PM Certified in Neurology, Neurophysiology and Neuroimaging  [facility] [address], [place], [place] [address] [phone]

## 2019-11-01 NOTE — Patient Instructions (Signed)
IDIOPATHIC NEUROPATHY (with restless leg syndrome and muscle spasm) - continue tizanidine 2mg  at bedtime; may increase to 4mg  at bedtime - may consider gabapentin 100-300mg  at bedtime

## 2019-11-07 DIAGNOSIS — I129 Hypertensive chronic kidney disease with stage 1 through stage 4 chronic kidney disease, or unspecified chronic kidney disease: Secondary | ICD-10-CM | POA: Diagnosis not present

## 2019-11-07 DIAGNOSIS — D638 Anemia in other chronic diseases classified elsewhere: Secondary | ICD-10-CM | POA: Diagnosis not present

## 2019-11-07 DIAGNOSIS — E1129 Type 2 diabetes mellitus with other diabetic kidney complication: Secondary | ICD-10-CM | POA: Diagnosis not present

## 2019-11-07 DIAGNOSIS — R809 Proteinuria, unspecified: Secondary | ICD-10-CM | POA: Diagnosis not present

## 2019-11-07 DIAGNOSIS — E1122 Type 2 diabetes mellitus with diabetic chronic kidney disease: Secondary | ICD-10-CM | POA: Diagnosis not present

## 2019-11-07 DIAGNOSIS — E211 Secondary hyperparathyroidism, not elsewhere classified: Secondary | ICD-10-CM | POA: Diagnosis not present

## 2019-11-07 DIAGNOSIS — R600 Localized edema: Secondary | ICD-10-CM | POA: Diagnosis not present

## 2019-11-07 DIAGNOSIS — E875 Hyperkalemia: Secondary | ICD-10-CM | POA: Diagnosis not present

## 2019-11-07 DIAGNOSIS — N189 Chronic kidney disease, unspecified: Secondary | ICD-10-CM | POA: Diagnosis not present

## 2019-11-09 DIAGNOSIS — D638 Anemia in other chronic diseases classified elsewhere: Secondary | ICD-10-CM | POA: Diagnosis not present

## 2019-11-09 DIAGNOSIS — N183 Chronic kidney disease, stage 3 unspecified: Secondary | ICD-10-CM | POA: Diagnosis not present

## 2019-11-09 DIAGNOSIS — E039 Hypothyroidism, unspecified: Secondary | ICD-10-CM | POA: Diagnosis not present

## 2019-11-09 DIAGNOSIS — I129 Hypertensive chronic kidney disease with stage 1 through stage 4 chronic kidney disease, or unspecified chronic kidney disease: Secondary | ICD-10-CM | POA: Diagnosis not present

## 2019-11-09 DIAGNOSIS — E211 Secondary hyperparathyroidism, not elsewhere classified: Secondary | ICD-10-CM | POA: Diagnosis not present

## 2019-11-09 DIAGNOSIS — K900 Celiac disease: Secondary | ICD-10-CM | POA: Diagnosis not present

## 2019-11-09 DIAGNOSIS — D631 Anemia in chronic kidney disease: Secondary | ICD-10-CM | POA: Diagnosis not present

## 2019-11-09 DIAGNOSIS — I4891 Unspecified atrial fibrillation: Secondary | ICD-10-CM | POA: Diagnosis not present

## 2019-11-09 DIAGNOSIS — E1122 Type 2 diabetes mellitus with diabetic chronic kidney disease: Secondary | ICD-10-CM | POA: Diagnosis not present

## 2019-11-09 DIAGNOSIS — E1129 Type 2 diabetes mellitus with other diabetic kidney complication: Secondary | ICD-10-CM | POA: Diagnosis not present

## 2019-11-09 DIAGNOSIS — R600 Localized edema: Secondary | ICD-10-CM | POA: Diagnosis not present

## 2019-11-09 DIAGNOSIS — I10 Essential (primary) hypertension: Secondary | ICD-10-CM | POA: Diagnosis not present

## 2019-11-09 DIAGNOSIS — E782 Mixed hyperlipidemia: Secondary | ICD-10-CM | POA: Diagnosis not present

## 2019-11-09 DIAGNOSIS — R809 Proteinuria, unspecified: Secondary | ICD-10-CM | POA: Diagnosis not present

## 2019-11-09 DIAGNOSIS — E1140 Type 2 diabetes mellitus with diabetic neuropathy, unspecified: Secondary | ICD-10-CM | POA: Diagnosis not present

## 2019-11-09 DIAGNOSIS — N189 Chronic kidney disease, unspecified: Secondary | ICD-10-CM | POA: Diagnosis not present

## 2019-11-09 DIAGNOSIS — E875 Hyperkalemia: Secondary | ICD-10-CM | POA: Diagnosis not present

## 2019-11-09 DIAGNOSIS — E119 Type 2 diabetes mellitus without complications: Secondary | ICD-10-CM | POA: Diagnosis not present

## 2019-11-09 DIAGNOSIS — I639 Cerebral infarction, unspecified: Secondary | ICD-10-CM | POA: Diagnosis not present

## 2019-11-28 DIAGNOSIS — N189 Chronic kidney disease, unspecified: Secondary | ICD-10-CM | POA: Diagnosis not present

## 2019-11-28 DIAGNOSIS — E211 Secondary hyperparathyroidism, not elsewhere classified: Secondary | ICD-10-CM | POA: Diagnosis not present

## 2019-11-28 DIAGNOSIS — R809 Proteinuria, unspecified: Secondary | ICD-10-CM | POA: Diagnosis not present

## 2019-11-28 DIAGNOSIS — E1122 Type 2 diabetes mellitus with diabetic chronic kidney disease: Secondary | ICD-10-CM | POA: Diagnosis not present

## 2019-11-28 DIAGNOSIS — E1129 Type 2 diabetes mellitus with other diabetic kidney complication: Secondary | ICD-10-CM | POA: Diagnosis not present

## 2019-11-28 DIAGNOSIS — E875 Hyperkalemia: Secondary | ICD-10-CM | POA: Diagnosis not present

## 2019-11-28 DIAGNOSIS — I129 Hypertensive chronic kidney disease with stage 1 through stage 4 chronic kidney disease, or unspecified chronic kidney disease: Secondary | ICD-10-CM | POA: Diagnosis not present

## 2019-11-28 DIAGNOSIS — R600 Localized edema: Secondary | ICD-10-CM | POA: Diagnosis not present

## 2019-11-28 DIAGNOSIS — D638 Anemia in other chronic diseases classified elsewhere: Secondary | ICD-10-CM | POA: Diagnosis not present

## 2019-11-30 DIAGNOSIS — E1122 Type 2 diabetes mellitus with diabetic chronic kidney disease: Secondary | ICD-10-CM | POA: Diagnosis not present

## 2019-11-30 DIAGNOSIS — E1129 Type 2 diabetes mellitus with other diabetic kidney complication: Secondary | ICD-10-CM | POA: Diagnosis not present

## 2019-11-30 DIAGNOSIS — N189 Chronic kidney disease, unspecified: Secondary | ICD-10-CM | POA: Diagnosis not present

## 2019-11-30 DIAGNOSIS — D638 Anemia in other chronic diseases classified elsewhere: Secondary | ICD-10-CM | POA: Diagnosis not present

## 2019-11-30 DIAGNOSIS — R600 Localized edema: Secondary | ICD-10-CM | POA: Diagnosis not present

## 2019-11-30 DIAGNOSIS — Z79899 Other long term (current) drug therapy: Secondary | ICD-10-CM | POA: Diagnosis not present

## 2019-11-30 DIAGNOSIS — E211 Secondary hyperparathyroidism, not elsewhere classified: Secondary | ICD-10-CM | POA: Diagnosis not present

## 2019-11-30 DIAGNOSIS — R809 Proteinuria, unspecified: Secondary | ICD-10-CM | POA: Diagnosis not present

## 2019-11-30 DIAGNOSIS — I129 Hypertensive chronic kidney disease with stage 1 through stage 4 chronic kidney disease, or unspecified chronic kidney disease: Secondary | ICD-10-CM | POA: Diagnosis not present

## 2019-12-06 DIAGNOSIS — I4891 Unspecified atrial fibrillation: Secondary | ICD-10-CM | POA: Diagnosis not present

## 2019-12-06 DIAGNOSIS — I350 Nonrheumatic aortic (valve) stenosis: Secondary | ICD-10-CM | POA: Diagnosis not present

## 2019-12-06 DIAGNOSIS — N1832 Chronic kidney disease, stage 3b: Secondary | ICD-10-CM | POA: Diagnosis not present

## 2019-12-06 DIAGNOSIS — I480 Paroxysmal atrial fibrillation: Secondary | ICD-10-CM | POA: Diagnosis not present

## 2019-12-06 DIAGNOSIS — D649 Anemia, unspecified: Secondary | ICD-10-CM | POA: Diagnosis not present

## 2019-12-06 DIAGNOSIS — L97811 Non-pressure chronic ulcer of other part of right lower leg limited to breakdown of skin: Secondary | ICD-10-CM | POA: Diagnosis not present

## 2019-12-06 DIAGNOSIS — Z0001 Encounter for general adult medical examination with abnormal findings: Secondary | ICD-10-CM | POA: Diagnosis not present

## 2019-12-06 DIAGNOSIS — I129 Hypertensive chronic kidney disease with stage 1 through stage 4 chronic kidney disease, or unspecified chronic kidney disease: Secondary | ICD-10-CM | POA: Diagnosis not present

## 2019-12-06 DIAGNOSIS — D631 Anemia in chronic kidney disease: Secondary | ICD-10-CM | POA: Diagnosis not present

## 2019-12-06 DIAGNOSIS — I5032 Chronic diastolic (congestive) heart failure: Secondary | ICD-10-CM | POA: Diagnosis not present

## 2019-12-06 DIAGNOSIS — L97911 Non-pressure chronic ulcer of unspecified part of right lower leg limited to breakdown of skin: Secondary | ICD-10-CM | POA: Diagnosis not present

## 2019-12-06 DIAGNOSIS — L97921 Non-pressure chronic ulcer of unspecified part of left lower leg limited to breakdown of skin: Secondary | ICD-10-CM | POA: Diagnosis not present

## 2019-12-08 DIAGNOSIS — K900 Celiac disease: Secondary | ICD-10-CM | POA: Diagnosis not present

## 2019-12-08 DIAGNOSIS — F5101 Primary insomnia: Secondary | ICD-10-CM | POA: Diagnosis not present

## 2019-12-08 DIAGNOSIS — D631 Anemia in chronic kidney disease: Secondary | ICD-10-CM | POA: Diagnosis not present

## 2019-12-08 DIAGNOSIS — E1140 Type 2 diabetes mellitus with diabetic neuropathy, unspecified: Secondary | ICD-10-CM | POA: Diagnosis not present

## 2019-12-08 DIAGNOSIS — I69351 Hemiplegia and hemiparesis following cerebral infarction affecting right dominant side: Secondary | ICD-10-CM | POA: Diagnosis not present

## 2019-12-08 DIAGNOSIS — E039 Hypothyroidism, unspecified: Secondary | ICD-10-CM | POA: Diagnosis not present

## 2019-12-08 DIAGNOSIS — I129 Hypertensive chronic kidney disease with stage 1 through stage 4 chronic kidney disease, or unspecified chronic kidney disease: Secondary | ICD-10-CM | POA: Diagnosis not present

## 2019-12-08 DIAGNOSIS — I639 Cerebral infarction, unspecified: Secondary | ICD-10-CM | POA: Diagnosis not present

## 2019-12-08 DIAGNOSIS — I4891 Unspecified atrial fibrillation: Secondary | ICD-10-CM | POA: Diagnosis not present

## 2019-12-08 DIAGNOSIS — I10 Essential (primary) hypertension: Secondary | ICD-10-CM | POA: Diagnosis not present

## 2019-12-08 DIAGNOSIS — E782 Mixed hyperlipidemia: Secondary | ICD-10-CM | POA: Diagnosis not present

## 2019-12-08 DIAGNOSIS — E1122 Type 2 diabetes mellitus with diabetic chronic kidney disease: Secondary | ICD-10-CM | POA: Diagnosis not present

## 2019-12-08 DIAGNOSIS — E875 Hyperkalemia: Secondary | ICD-10-CM | POA: Diagnosis not present

## 2019-12-08 DIAGNOSIS — N1831 Chronic kidney disease, stage 3a: Secondary | ICD-10-CM | POA: Diagnosis not present

## 2019-12-08 DIAGNOSIS — I482 Chronic atrial fibrillation, unspecified: Secondary | ICD-10-CM | POA: Diagnosis not present

## 2019-12-13 DIAGNOSIS — B351 Tinea unguium: Secondary | ICD-10-CM | POA: Diagnosis not present

## 2019-12-13 DIAGNOSIS — E1142 Type 2 diabetes mellitus with diabetic polyneuropathy: Secondary | ICD-10-CM | POA: Diagnosis not present

## 2019-12-20 DIAGNOSIS — D638 Anemia in other chronic diseases classified elsewhere: Secondary | ICD-10-CM | POA: Diagnosis not present

## 2019-12-20 DIAGNOSIS — I129 Hypertensive chronic kidney disease with stage 1 through stage 4 chronic kidney disease, or unspecified chronic kidney disease: Secondary | ICD-10-CM | POA: Diagnosis not present

## 2019-12-20 DIAGNOSIS — N189 Chronic kidney disease, unspecified: Secondary | ICD-10-CM | POA: Diagnosis not present

## 2019-12-20 DIAGNOSIS — R809 Proteinuria, unspecified: Secondary | ICD-10-CM | POA: Diagnosis not present

## 2019-12-20 DIAGNOSIS — E1122 Type 2 diabetes mellitus with diabetic chronic kidney disease: Secondary | ICD-10-CM | POA: Diagnosis not present

## 2019-12-20 DIAGNOSIS — Z79899 Other long term (current) drug therapy: Secondary | ICD-10-CM | POA: Diagnosis not present

## 2019-12-20 DIAGNOSIS — E211 Secondary hyperparathyroidism, not elsewhere classified: Secondary | ICD-10-CM | POA: Diagnosis not present

## 2019-12-20 DIAGNOSIS — E1129 Type 2 diabetes mellitus with other diabetic kidney complication: Secondary | ICD-10-CM | POA: Diagnosis not present

## 2019-12-20 DIAGNOSIS — R600 Localized edema: Secondary | ICD-10-CM | POA: Diagnosis not present

## 2019-12-23 DIAGNOSIS — N189 Chronic kidney disease, unspecified: Secondary | ICD-10-CM | POA: Diagnosis not present

## 2019-12-23 DIAGNOSIS — R600 Localized edema: Secondary | ICD-10-CM | POA: Diagnosis not present

## 2019-12-23 DIAGNOSIS — E211 Secondary hyperparathyroidism, not elsewhere classified: Secondary | ICD-10-CM | POA: Diagnosis not present

## 2019-12-23 DIAGNOSIS — Z79899 Other long term (current) drug therapy: Secondary | ICD-10-CM | POA: Diagnosis not present

## 2019-12-23 DIAGNOSIS — D638 Anemia in other chronic diseases classified elsewhere: Secondary | ICD-10-CM | POA: Diagnosis not present

## 2019-12-23 DIAGNOSIS — I129 Hypertensive chronic kidney disease with stage 1 through stage 4 chronic kidney disease, or unspecified chronic kidney disease: Secondary | ICD-10-CM | POA: Diagnosis not present

## 2019-12-23 DIAGNOSIS — E1122 Type 2 diabetes mellitus with diabetic chronic kidney disease: Secondary | ICD-10-CM | POA: Diagnosis not present

## 2019-12-23 DIAGNOSIS — R809 Proteinuria, unspecified: Secondary | ICD-10-CM | POA: Diagnosis not present

## 2019-12-23 DIAGNOSIS — E1129 Type 2 diabetes mellitus with other diabetic kidney complication: Secondary | ICD-10-CM | POA: Diagnosis not present

## 2019-12-30 DIAGNOSIS — E119 Type 2 diabetes mellitus without complications: Secondary | ICD-10-CM | POA: Diagnosis not present

## 2020-01-02 DIAGNOSIS — K900 Celiac disease: Secondary | ICD-10-CM | POA: Diagnosis not present

## 2020-01-02 DIAGNOSIS — N183 Chronic kidney disease, stage 3 unspecified: Secondary | ICD-10-CM | POA: Diagnosis not present

## 2020-01-02 DIAGNOSIS — I482 Chronic atrial fibrillation, unspecified: Secondary | ICD-10-CM | POA: Diagnosis not present

## 2020-01-02 DIAGNOSIS — E875 Hyperkalemia: Secondary | ICD-10-CM | POA: Diagnosis not present

## 2020-01-02 DIAGNOSIS — E782 Mixed hyperlipidemia: Secondary | ICD-10-CM | POA: Diagnosis not present

## 2020-01-02 DIAGNOSIS — I10 Essential (primary) hypertension: Secondary | ICD-10-CM | POA: Diagnosis not present

## 2020-01-02 DIAGNOSIS — E1122 Type 2 diabetes mellitus with diabetic chronic kidney disease: Secondary | ICD-10-CM | POA: Diagnosis not present

## 2020-01-02 DIAGNOSIS — I129 Hypertensive chronic kidney disease with stage 1 through stage 4 chronic kidney disease, or unspecified chronic kidney disease: Secondary | ICD-10-CM | POA: Diagnosis not present

## 2020-01-05 ENCOUNTER — Encounter: Payer: Self-pay | Admitting: Diagnostic Neuroimaging

## 2020-01-05 ENCOUNTER — Telehealth: Payer: Self-pay | Admitting: Diagnostic Neuroimaging

## 2020-01-05 ENCOUNTER — Ambulatory Visit (INDEPENDENT_AMBULATORY_CARE_PROVIDER_SITE_OTHER): Payer: PPO | Admitting: Physician Assistant

## 2020-01-05 ENCOUNTER — Encounter: Payer: Self-pay | Admitting: Physician Assistant

## 2020-01-05 VITALS — BP 124/60 | HR 78 | Temp 97.8°F | Ht 69.0 in | Wt 215.0 lb

## 2020-01-05 DIAGNOSIS — E785 Hyperlipidemia, unspecified: Secondary | ICD-10-CM | POA: Diagnosis not present

## 2020-01-05 DIAGNOSIS — I350 Nonrheumatic aortic (valve) stenosis: Secondary | ICD-10-CM

## 2020-01-05 DIAGNOSIS — I10 Essential (primary) hypertension: Secondary | ICD-10-CM

## 2020-01-05 DIAGNOSIS — I480 Paroxysmal atrial fibrillation: Secondary | ICD-10-CM | POA: Diagnosis not present

## 2020-01-05 DIAGNOSIS — N1832 Chronic kidney disease, stage 3b: Secondary | ICD-10-CM | POA: Diagnosis not present

## 2020-01-05 NOTE — Patient Instructions (Signed)
Medication Instructions:  Your physician recommends that you continue on your current medications as directed. Please refer to the Current Medication list given to you today.  *If you need a refill on your cardiac medications before your next appointment, please call your pharmacy*   Lab Work: NONE   If you have labs (blood work) drawn today and your tests are completely normal, you will receive your results only by: . MyChart Message (if you have MyChart) OR . A paper copy in the mail If you have any lab test that is abnormal or we need to change your treatment, we will call you to review the results.   Testing/Procedures: NONE    Follow-Up: At [facility], you and your health needs are our priority.  As part of our continuing mission to provide you with exceptional heart care, we have created designated Provider Care Teams.  These Care Teams include your primary Cardiologist (physician) and Advanced Practice Providers (APPs -  Physician Assistants and Nurse Practitioners) who all work together to provide you with the care you need, when you need it.  We recommend signing up for the patient portal called "MyChart".  Sign up information is provided on this After Visit Summary.  MyChart is used to connect with patients for Virtual Visits (Telemedicine).  Patients are able to view lab/test results, encounter notes, upcoming appointments, etc.  Non-urgent messages can be sent to your provider as well.   To learn more about what you can do with MyChart, go to [email or web address].    Your next appointment:   6 month(s)  The format for your next appointment:   In Person  Provider:   Samuel McDowell, MD   Other Instructions Thank you for choosing [facility]!    

## 2020-01-05 NOTE — Telephone Encounter (Signed)
Pt wife LVM stating that the pt would like to try another medication in replace of the tizanidine. States the medication is not helping with spasms and would like to know what other options are available.

## 2020-01-05 NOTE — Telephone Encounter (Signed)
Ok to take both, but can increase the sedation. -VRP

## 2020-01-05 NOTE — Progress Notes (Signed)
Cardiology Office Note   Date:  01/05/2020   ID:  [name], [name] [birth year], MRN [id number]  PCP:  Celene Squibb, MD Cardiologist:  Rozann Lesches, MD 07/27/2019 Electrphysiologist: None Rosaria Ferries, PA-C   No chief complaint on file.   History of Present Illness: [name] is a 86 y.o. male with a history of DM, HTN, Hypothyroid, PAF on Eliquis, asthmatic bronchitis, GERD, celiac dz, prostate CA & melanoma, CVA 09/2017, OSA, hyperkalemia on Lokelma & now off ARB (Cr recently 1.85)  [name] presents for cardiology follow up.  His ambulation is limited by MS issues. His hips cause great pain when he walks any distance. He has been outside walking the driveway when the weather is good. Hopes to do this more.   He gets very tired with exertion, but does not get CP/SOB.   He is never aware of the Afib, but has been told he is in it more. He never gets light-headed or dizzy.  Never thinks he is going to pass out.  Does not miss doses of Eliquis.   He sees Dr Theador Hawthorne regularly, was told his last labs were good.   His BP has been running a little high recently. It was good today, but has generally been in the 120s-140s.  However, the BPs may be taken shortly after his meds.  He is not sure about the timing of the blood pressures in relation to his medications.  He will pay more attention to that.  His wife keeps excellent records.  He has some lower extremity edema chronically, worse during the day.  His dyspnea on exertion has not changed recently.  He denies orthopnea or PND.   Past Medical History:  Diagnosis Date  . Allergic rhinitis   . Asthmatic bronchitis   . Atrial fibrillation ([place])   . Celiac disease   . Cellulitis   . Diabetes ([place])   . Diverticulosis   . Fx. left wrist 1987  . Gastric polyps   . GERD (gastroesophageal reflux disease)   . History of pneumonia   . Hypertension   . Hypothyroidism   . Iron deficiency anemia   . Melanoma  ([place])   . Neuropathy   . PAF (paroxysmal atrial fibrillation) ([place])   . Prostate cancer ([place])   . REM sleep behavior disorder   . RLS (restless legs syndrome)   . Sleep apnea   . Spasticity   . Stroke ([place]) 09/2017  . Tremor   . Type 2 diabetes mellitus ([place])     Past Surgical History:  Procedure Laterality Date  . APPENDECTOMY  1978  . CARPAL TUNNEL RELEASE Left   . CATARACT EXTRACTION  03/2011   bilateral   . CHOLECYSTECTOMY  2001  . COLONOSCOPY W/ BIOPSIES  04/27/2008   diverticulosis, duodenitis  . FOOT SURGERY    . HERNIA REPAIR  1994   bilateral  . KNEE SURGERY Bilateral    x 2  . LUNG SURGERY  2001   for infection  . PROSTATECTOMY  2002  . UPPER GASTROINTESTINAL ENDOSCOPY  04/27/2008   celiac disease, gastric polyps    Current Outpatient Medications  Medication Sig Dispense Refill  . acetaminophen (TYLENOL) 325 MG tablet Take 650 mg by mouth every 6 (six) hours as needed. Takes 2 once daily    . amitriptyline (ELAVIL) 50 MG tablet Take 50 mg by mouth at bedtime.    Marland Kitchen apixaban (ELIQUIS) 2.5 MG TABS tablet Take 1  tablet (2.5 mg total) by mouth 2 (two) times daily. 60 tablet 6  . Ascorbic Acid (VITAMIN C) 1000 MG tablet Take 1,000 mg by mouth 2 (two) times daily.      Marland Kitchen atorvastatin (LIPITOR) 20 MG tablet Take 20 mg by mouth daily.    Marland Kitchen azelastine (ASTELIN) 0.1 % nasal spray Place 1 spray into both nostrils at bedtime.     . Biotin 10 MG CAPS Take 10 mg by mouth daily.    . Cholecalciferol (VITAMIN D3) 1000 UNITS CAPS Take 3,000 Units by mouth 3 (three) times daily.     . diclofenac sodium (VOLTAREN) 1 % GEL Apply 2 g topically 4 (four) times daily. 1 Tube 0  . Fexofenadine HCl (MUCINEX ALLERGY PO) Take 1 tablet by mouth daily.    . fluticasone (FLONASE) 50 MCG/ACT nasal spray Place 1 spray into both nostrils daily.     . furosemide (LASIX) 20 MG tablet Take 20 mg by mouth 2 (two) times daily.    . Ginger, Zingiber officinalis, (GINGER ROOT) 550 MG CAPS Take by mouth.  2 daily    . glipiZIDE (GLUCOTROL XL) 10 MG 24 hr tablet 10 mg.    . hydrALAZINE (APRESOLINE) 100 MG tablet Take 1 tablet (100 mg total) by mouth 3 (three) times daily. 180 tablet 3  . JANUVIA 100 MG tablet 100 mg daily. morning    . levocetirizine (XYZAL) 5 MG tablet Take 5 mg by mouth at bedtime.     Marland Kitchen levothyroxine (SYNTHROID, LEVOTHROID) 150 MCG tablet Take 1 tablet (150 mcg total) by mouth daily before breakfast. 30 tablet 0  . MAGNESIUM MALATE PO Take 500 mg by mouth daily.    . Melatonin 3 MG TABS Take 3 mg by mouth at bedtime.     . metoprolol tartrate (LOPRESSOR) 25 MG tablet Take 75 mg by mouth 2 (two) times daily.    . Misc Natural Products (TART CHERRY ADVANCED PO) Take 425 mg by mouth. 2 daily    . pantoprazole (PROTONIX) 40 MG tablet Take 1 tablet (40 mg total) by mouth daily. 30 tablet 0  . polyethylene glycol (MIRALAX / GLYCOLAX) 17 g packet Take 17 g by mouth as needed.    Marland Kitchen tiZANidine (ZANAFLEX) 2 MG tablet      No current facility-administered medications for this visit.    Allergies:   Clindamycin, Gluten meal, Hydrocodone, Penicillins, Procaine, Sulfonamide derivatives, Tape, Wheat bran, Cephalexin, and Mold extract [trichophyton]    Social History:  The patient  reports that he has never smoked. He has never used smokeless tobacco. He reports that he does not drink alcohol or use drugs.   Family History:  The patient's family history includes Breast cancer in his mother; Colon cancer in an other family member; Heart disease in his father; Kidney failure in his father; Rheumatic fever in his father.  He indicated that his mother is deceased. He indicated that his father is deceased. He indicated that his maternal grandmother is deceased. He indicated that his maternal grandfather is deceased. He indicated that his paternal grandmother is deceased. He indicated that his paternal grandfather is deceased. He indicated that the status of his other is unknown.   ROS:  Please  see the history of present illness. All other systems are reviewed and negative.    PHYSICAL EXAM: VS:  BP 124/60   Pulse 78   Temp 97.8 F (36.6 C)   Ht 5\' 9"  (1.753 m)   Wt 215 lb (  97.5 kg)   SpO2 95%   BMI 31.75 kg/m  , BMI Body mass index is 31.75 kg/m. GEN: Well nourished, well developed, male in no acute distress HEENT: normal for age  Neck: no JVD, no carotid bruit, no masses Cardiac: RRR; 2-3/6 murmur, no rubs, or gallops Respiratory: Decreased breath sounds bases to auscultation bilaterally, normal work of breathing GI: soft, nontender, nondistended, + BS MS: no deformity or atrophy; trace-1+ lower extremity edema; distal pulses are 1-2 + in all 4 extremities  Skin: warm and dry, no rash Neuro:  Strength and sensation are intact Psych: euthymic mood, full affect   EKG:  EKG is not ordered today.  Echocardiogram 02/25/2019: 1. The left ventricle has normal systolic function with an ejection fraction of 60-65%. The cavity size was normal. There is mild concentric left ventricular hypertrophy. Left ventricular diastolic Doppler parameters are consistent with impaired  relaxation. Indeterminate filling pressures No evidence of left ventricular regional wall motion abnormalities. 2. The right ventricle has normal systolic function. The cavity was normal. There is no increase in right ventricular wall thickness. 3. Left atrial size was mildly dilated. 4. The mitral valve is grossly normal. 5. The tricuspid valve is grossly normal. 6. The aortic valve is tricuspid. Moderate thickening of the aortic valve. Moderate calcification of the aortic valve. Aortic valve regurgitation is trivial by color flow Doppler. Moderate stenosis of the aortic valve.  Mean gradient is 22, peak gradient 36 7. Moderate aortic annular calcification. 8. The aortic root is normal in size and structure.   Recent Labs: 07/27/2019: BUN 37; Creatinine, Ser 1.52; Potassium 4.8; Sodium 138  CBC     Component Value Date/Time   WBC 9.9 10/08/2017 0848   RBC 3.95 (L) 10/08/2017 0848   HGB 12.6 (L) 10/08/2017 0848   HCT 38.2 (L) 10/08/2017 0848   PLT 309 10/08/2017 0848   MCV 96.7 10/08/2017 0848   MCH 31.9 10/08/2017 0848   MCHC 33.0 10/08/2017 0848   RDW 13.3 10/08/2017 0848   LYMPHSABS 1.7 10/08/2017 0848   MONOABS 1.0 10/08/2017 0848   EOSABS 0.3 10/08/2017 0848   BASOSABS 0.0 10/08/2017 0848   CMP Latest Ref Rng & Units 07/27/2019 10/08/2017 10/07/2017  Glucose 70 - 99 mg/dL 179(H) 202(H) 144(H)  BUN 8 - 23 mg/dL 37(H) 20 17  Creatinine 0.61 - 1.24 mg/dL 1.52(H) 1.21 1.13  Sodium 135 - 145 mmol/L 138 137 138  Potassium 3.5 - 5.1 mmol/L 4.8 3.5 3.8  Chloride 98 - 111 mmol/L 103 102 104  CO2 22 - 32 mmol/L 27 24 25   Calcium 8.9 - 10.3 mg/dL 8.9 8.8(L) 8.6(L)  Total Protein 6.5 - 8.1 g/dL - 7.0 -  Total Bilirubin 0.3 - 1.2 mg/dL - 0.8 -  Alkaline Phos 38 - 126 U/L - 87 -  AST 15 - 41 U/L - 37 -  ALT 17 - 63 U/L - 33 -     Lipid Panel Lab Results  Component Value Date   CHOL 160 10/03/2017   HDL 37 (L) 10/03/2017   LDLCALC 90 10/03/2017   TRIG 164 (H) 10/03/2017   CHOLHDL 4.3 10/03/2017      Wt Readings from Last 3 Encounters:  01/05/20 215 lb (97.5 kg)  11/01/19 218 lb 9.6 oz (99.2 kg)  07/27/19 226 lb (102.5 kg)     Other studies Reviewed: Additional studies/ records that were reviewed today include: Office notes, hospital records and testing.  ASSESSMENT AND PLAN:  1.  PAF:  -  His heart rate is regular today, do not believe he is in PAF at this time -His family physician and Dr. Theador Hawthorne have detected it, he has been told he is in it more than previously -However, there are no symptoms obviously attributable to this and he does not miss doses of his beta-blocker or Eliquis -No further evaluation or work-up is indicated at this time  2.  Moderate aortic stenosis - he is having no presyncope or syncope. - he is not having any chest pain. -He is not  having any dyspnea on exertion -Continue to follow for symptoms, do not see need to recheck an echocardiogram right now  3. CKD 3B -Dr. Theador Hawthorne follows him closely for this. -We will leave volume management to him  4.  Hypertension: -His blood pressure is elevated at times.  His wife is encouraged to make sure that she checks his blood pressure at least 2 hours after he takes his morning medications. -Continue to record these as she is doing -Will leave blood pressure management to Dr. Theador Hawthorne as his Lasix is a significant component of this -Because of his aortic stenosis, wish to avoid hypotension  5.  Hyperlipidemia: -Continue Lipitor 20 mg daily -Lab report in [place] shows total cholesterol 109 and HDL of 40, but LDL is not listed -Mr. Gentzler is encouraged to stick to a low-cholesterol diet.   Current medicines are reviewed at length with the patient today.  The patient does not have concerns regarding medicines.  The following changes have been made:  no change  Labs/ tests ordered today include:  No orders of the defined types were placed in this encounter.    Disposition:   FU with Rozann Lesches, MD  Signed, Rosaria Ferries, PA-C  01/05/2020 2:25 PM    [place] Phone: [phone]; Fax: [phone]

## 2020-01-05 NOTE — Telephone Encounter (Signed)
Called wife and asked how much tizanidine he's taking. He's taking  4 mg at night. He did take an extra dose on one night but it wasn't helpful. He wakes in middle of night with spasms. She noted his AVS mentioned gabapentin. He took gabapentin when he had shingles, and it was helpful. She asked if he can take both medications and would gabapentin affect his kidneys. He sees nephrologist for CKD, takes furosemide daily. I advised will send to MD and call her back. She verbalized understanding, appreciation.

## 2020-01-09 MED ORDER — GABAPENTIN 100 MG PO CAPS: 100.0000 mg | ORAL_CAPSULE | Freq: Three times a day (TID) | ORAL | 6 refills | Status: DC

## 2020-01-09 NOTE — Telephone Encounter (Signed)
Start gabapentin 100mg  at bedtime. May gradually increase to 200 or 300 mg at bedtime. Ok to take with tizanidine. Caution with over sedation.   Meds ordered this encounter  Medications  . gabapentin (NEURONTIN) 100 MG capsule    Sig: Take 1 capsule (100 mg total) by mouth 3 (three) times daily.    Dispense:  90 capsule    Refill:  [place], MD 7/61/6073, 7:10 PM Certified in Neurology, Neurophysiology and Neuroimaging  [facility] [address], [place], [place] [address] [phone]

## 2020-01-09 NOTE — Telephone Encounter (Signed)
Called wife and informed her of Dr Gladstone Lighter reply and caution with increased sedation.  She asked if it'll affect his kidneys. I advised she can call nephrologist to discuss, however Dr Leta Baptist didn't indicate an issue. She stated that the gabapentin he has is 84 years old. She's requesting new Rx for low dose of gabapentin to [facility]. He's currently taking tizanidine 2 mg 2 tabs at bedtime.  She asked if he can repeat one tab in night. I advised his Rx states 1 tab every 6 hours as needed, so if he does it'll need to be at least 6 hours later. Cautioned again about sedation effects. . Will discuss with MD and only call her back if MD states he may not take additional tab  in night. She verbalized understanding, appreciation.

## 2020-01-10 NOTE — Telephone Encounter (Signed)
Called wife and informed her Dr Leta Baptist prescribed gabapentin 100 mg at bedtime, may increase to 200 mg- 300 mg if needed. She asked about taking with tizanidine; I advised he may but be cautious about sedation. She verbalized understanding, appreciation.

## 2020-01-12 DIAGNOSIS — E1122 Type 2 diabetes mellitus with diabetic chronic kidney disease: Secondary | ICD-10-CM | POA: Diagnosis not present

## 2020-01-12 DIAGNOSIS — K900 Celiac disease: Secondary | ICD-10-CM | POA: Diagnosis not present

## 2020-01-12 DIAGNOSIS — I10 Essential (primary) hypertension: Secondary | ICD-10-CM | POA: Diagnosis not present

## 2020-01-12 DIAGNOSIS — N1831 Chronic kidney disease, stage 3a: Secondary | ICD-10-CM | POA: Diagnosis not present

## 2020-01-12 DIAGNOSIS — I482 Chronic atrial fibrillation, unspecified: Secondary | ICD-10-CM | POA: Diagnosis not present

## 2020-01-12 DIAGNOSIS — I129 Hypertensive chronic kidney disease with stage 1 through stage 4 chronic kidney disease, or unspecified chronic kidney disease: Secondary | ICD-10-CM | POA: Diagnosis not present

## 2020-01-12 DIAGNOSIS — E875 Hyperkalemia: Secondary | ICD-10-CM | POA: Diagnosis not present

## 2020-01-12 DIAGNOSIS — E782 Mixed hyperlipidemia: Secondary | ICD-10-CM | POA: Diagnosis not present

## 2020-01-23 DIAGNOSIS — N189 Chronic kidney disease, unspecified: Secondary | ICD-10-CM | POA: Diagnosis not present

## 2020-01-23 DIAGNOSIS — D638 Anemia in other chronic diseases classified elsewhere: Secondary | ICD-10-CM | POA: Diagnosis not present

## 2020-01-23 DIAGNOSIS — E1122 Type 2 diabetes mellitus with diabetic chronic kidney disease: Secondary | ICD-10-CM | POA: Diagnosis not present

## 2020-01-23 DIAGNOSIS — Z79899 Other long term (current) drug therapy: Secondary | ICD-10-CM | POA: Diagnosis not present

## 2020-01-23 DIAGNOSIS — I129 Hypertensive chronic kidney disease with stage 1 through stage 4 chronic kidney disease, or unspecified chronic kidney disease: Secondary | ICD-10-CM | POA: Diagnosis not present

## 2020-01-23 DIAGNOSIS — R809 Proteinuria, unspecified: Secondary | ICD-10-CM | POA: Diagnosis not present

## 2020-01-23 DIAGNOSIS — E1129 Type 2 diabetes mellitus with other diabetic kidney complication: Secondary | ICD-10-CM | POA: Diagnosis not present

## 2020-01-23 DIAGNOSIS — R600 Localized edema: Secondary | ICD-10-CM | POA: Diagnosis not present

## 2020-01-23 DIAGNOSIS — E211 Secondary hyperparathyroidism, not elsewhere classified: Secondary | ICD-10-CM | POA: Diagnosis not present

## 2020-01-25 DIAGNOSIS — R600 Localized edema: Secondary | ICD-10-CM | POA: Diagnosis not present

## 2020-01-25 DIAGNOSIS — D638 Anemia in other chronic diseases classified elsewhere: Secondary | ICD-10-CM | POA: Diagnosis not present

## 2020-01-25 DIAGNOSIS — E211 Secondary hyperparathyroidism, not elsewhere classified: Secondary | ICD-10-CM | POA: Diagnosis not present

## 2020-01-25 DIAGNOSIS — E875 Hyperkalemia: Secondary | ICD-10-CM | POA: Diagnosis not present

## 2020-01-25 DIAGNOSIS — N189 Chronic kidney disease, unspecified: Secondary | ICD-10-CM | POA: Diagnosis not present

## 2020-01-25 DIAGNOSIS — I129 Hypertensive chronic kidney disease with stage 1 through stage 4 chronic kidney disease, or unspecified chronic kidney disease: Secondary | ICD-10-CM | POA: Diagnosis not present

## 2020-01-25 DIAGNOSIS — E1122 Type 2 diabetes mellitus with diabetic chronic kidney disease: Secondary | ICD-10-CM | POA: Diagnosis not present

## 2020-02-14 DIAGNOSIS — I482 Chronic atrial fibrillation, unspecified: Secondary | ICD-10-CM | POA: Diagnosis not present

## 2020-02-14 DIAGNOSIS — E875 Hyperkalemia: Secondary | ICD-10-CM | POA: Diagnosis not present

## 2020-02-14 DIAGNOSIS — N1831 Chronic kidney disease, stage 3a: Secondary | ICD-10-CM | POA: Diagnosis not present

## 2020-02-14 DIAGNOSIS — I129 Hypertensive chronic kidney disease with stage 1 through stage 4 chronic kidney disease, or unspecified chronic kidney disease: Secondary | ICD-10-CM | POA: Diagnosis not present

## 2020-02-14 DIAGNOSIS — E782 Mixed hyperlipidemia: Secondary | ICD-10-CM | POA: Diagnosis not present

## 2020-02-14 DIAGNOSIS — I10 Essential (primary) hypertension: Secondary | ICD-10-CM | POA: Diagnosis not present

## 2020-02-14 DIAGNOSIS — E1122 Type 2 diabetes mellitus with diabetic chronic kidney disease: Secondary | ICD-10-CM | POA: Diagnosis not present

## 2020-02-14 DIAGNOSIS — K900 Celiac disease: Secondary | ICD-10-CM | POA: Diagnosis not present

## 2020-02-18 DIAGNOSIS — E119 Type 2 diabetes mellitus without complications: Secondary | ICD-10-CM | POA: Diagnosis not present

## 2020-02-24 DIAGNOSIS — E1122 Type 2 diabetes mellitus with diabetic chronic kidney disease: Secondary | ICD-10-CM | POA: Diagnosis not present

## 2020-02-24 DIAGNOSIS — R600 Localized edema: Secondary | ICD-10-CM | POA: Diagnosis not present

## 2020-02-24 DIAGNOSIS — E875 Hyperkalemia: Secondary | ICD-10-CM | POA: Diagnosis not present

## 2020-02-24 DIAGNOSIS — I129 Hypertensive chronic kidney disease with stage 1 through stage 4 chronic kidney disease, or unspecified chronic kidney disease: Secondary | ICD-10-CM | POA: Diagnosis not present

## 2020-02-24 DIAGNOSIS — D638 Anemia in other chronic diseases classified elsewhere: Secondary | ICD-10-CM | POA: Diagnosis not present

## 2020-02-24 DIAGNOSIS — E211 Secondary hyperparathyroidism, not elsewhere classified: Secondary | ICD-10-CM | POA: Diagnosis not present

## 2020-02-24 DIAGNOSIS — N189 Chronic kidney disease, unspecified: Secondary | ICD-10-CM | POA: Diagnosis not present

## 2020-02-28 DIAGNOSIS — E1142 Type 2 diabetes mellitus with diabetic polyneuropathy: Secondary | ICD-10-CM | POA: Diagnosis not present

## 2020-02-28 DIAGNOSIS — B351 Tinea unguium: Secondary | ICD-10-CM | POA: Diagnosis not present

## 2020-02-29 DIAGNOSIS — E1122 Type 2 diabetes mellitus with diabetic chronic kidney disease: Secondary | ICD-10-CM | POA: Diagnosis not present

## 2020-02-29 DIAGNOSIS — D638 Anemia in other chronic diseases classified elsewhere: Secondary | ICD-10-CM | POA: Diagnosis not present

## 2020-02-29 DIAGNOSIS — E211 Secondary hyperparathyroidism, not elsewhere classified: Secondary | ICD-10-CM | POA: Diagnosis not present

## 2020-02-29 DIAGNOSIS — N179 Acute kidney failure, unspecified: Secondary | ICD-10-CM | POA: Diagnosis not present

## 2020-02-29 DIAGNOSIS — R809 Proteinuria, unspecified: Secondary | ICD-10-CM | POA: Diagnosis not present

## 2020-02-29 DIAGNOSIS — E1129 Type 2 diabetes mellitus with other diabetic kidney complication: Secondary | ICD-10-CM | POA: Diagnosis not present

## 2020-02-29 DIAGNOSIS — E870 Hyperosmolality and hypernatremia: Secondary | ICD-10-CM | POA: Diagnosis not present

## 2020-02-29 DIAGNOSIS — I10 Essential (primary) hypertension: Secondary | ICD-10-CM | POA: Diagnosis not present

## 2020-02-29 DIAGNOSIS — N189 Chronic kidney disease, unspecified: Secondary | ICD-10-CM | POA: Diagnosis not present

## 2020-02-29 DIAGNOSIS — E875 Hyperkalemia: Secondary | ICD-10-CM | POA: Diagnosis not present

## 2020-03-06 DIAGNOSIS — S80822A Blister (nonthermal), left lower leg, initial encounter: Secondary | ICD-10-CM | POA: Diagnosis not present

## 2020-03-09 DIAGNOSIS — L03116 Cellulitis of left lower limb: Secondary | ICD-10-CM | POA: Diagnosis not present

## 2020-03-13 DIAGNOSIS — E1142 Type 2 diabetes mellitus with diabetic polyneuropathy: Secondary | ICD-10-CM | POA: Diagnosis not present

## 2020-03-13 DIAGNOSIS — E1122 Type 2 diabetes mellitus with diabetic chronic kidney disease: Secondary | ICD-10-CM | POA: Diagnosis not present

## 2020-03-13 DIAGNOSIS — Z85820 Personal history of malignant melanoma of skin: Secondary | ICD-10-CM | POA: Diagnosis not present

## 2020-03-13 DIAGNOSIS — G251 Drug-induced tremor: Secondary | ICD-10-CM | POA: Diagnosis not present

## 2020-03-13 DIAGNOSIS — X32XXXD Exposure to sunlight, subsequent encounter: Secondary | ICD-10-CM | POA: Diagnosis not present

## 2020-03-13 DIAGNOSIS — L03116 Cellulitis of left lower limb: Secondary | ICD-10-CM | POA: Diagnosis not present

## 2020-03-13 DIAGNOSIS — E039 Hypothyroidism, unspecified: Secondary | ICD-10-CM | POA: Diagnosis not present

## 2020-03-13 DIAGNOSIS — D509 Iron deficiency anemia, unspecified: Secondary | ICD-10-CM | POA: Diagnosis not present

## 2020-03-13 DIAGNOSIS — I110 Hypertensive heart disease with heart failure: Secondary | ICD-10-CM | POA: Diagnosis not present

## 2020-03-13 DIAGNOSIS — E669 Obesity, unspecified: Secondary | ICD-10-CM | POA: Diagnosis not present

## 2020-03-13 DIAGNOSIS — E782 Mixed hyperlipidemia: Secondary | ICD-10-CM | POA: Diagnosis not present

## 2020-03-13 DIAGNOSIS — E1140 Type 2 diabetes mellitus with diabetic neuropathy, unspecified: Secondary | ICD-10-CM | POA: Diagnosis not present

## 2020-03-13 DIAGNOSIS — F5101 Primary insomnia: Secondary | ICD-10-CM | POA: Diagnosis not present

## 2020-03-13 DIAGNOSIS — Z1283 Encounter for screening for malignant neoplasm of skin: Secondary | ICD-10-CM | POA: Diagnosis not present

## 2020-03-13 DIAGNOSIS — D631 Anemia in chronic kidney disease: Secondary | ICD-10-CM | POA: Diagnosis not present

## 2020-03-13 DIAGNOSIS — Z08 Encounter for follow-up examination after completed treatment for malignant neoplasm: Secondary | ICD-10-CM | POA: Diagnosis not present

## 2020-03-13 DIAGNOSIS — L570 Actinic keratosis: Secondary | ICD-10-CM | POA: Diagnosis not present

## 2020-03-13 DIAGNOSIS — D649 Anemia, unspecified: Secondary | ICD-10-CM | POA: Diagnosis not present

## 2020-03-16 DIAGNOSIS — E1122 Type 2 diabetes mellitus with diabetic chronic kidney disease: Secondary | ICD-10-CM | POA: Diagnosis not present

## 2020-03-16 DIAGNOSIS — N189 Chronic kidney disease, unspecified: Secondary | ICD-10-CM | POA: Diagnosis not present

## 2020-03-16 DIAGNOSIS — I10 Essential (primary) hypertension: Secondary | ICD-10-CM | POA: Diagnosis not present

## 2020-03-16 DIAGNOSIS — E875 Hyperkalemia: Secondary | ICD-10-CM | POA: Diagnosis not present

## 2020-03-16 DIAGNOSIS — E211 Secondary hyperparathyroidism, not elsewhere classified: Secondary | ICD-10-CM | POA: Diagnosis not present

## 2020-03-16 DIAGNOSIS — D638 Anemia in other chronic diseases classified elsewhere: Secondary | ICD-10-CM | POA: Diagnosis not present

## 2020-03-16 DIAGNOSIS — N179 Acute kidney failure, unspecified: Secondary | ICD-10-CM | POA: Diagnosis not present

## 2020-03-20 DIAGNOSIS — H5203 Hypermetropia, bilateral: Secondary | ICD-10-CM | POA: Diagnosis not present

## 2020-03-20 DIAGNOSIS — E119 Type 2 diabetes mellitus without complications: Secondary | ICD-10-CM | POA: Diagnosis not present

## 2020-03-20 DIAGNOSIS — H52203 Unspecified astigmatism, bilateral: Secondary | ICD-10-CM | POA: Diagnosis not present

## 2020-03-20 DIAGNOSIS — H524 Presbyopia: Secondary | ICD-10-CM | POA: Diagnosis not present

## 2020-03-20 DIAGNOSIS — Z961 Presence of intraocular lens: Secondary | ICD-10-CM | POA: Diagnosis not present

## 2020-03-20 DIAGNOSIS — H353132 Nonexudative age-related macular degeneration, bilateral, intermediate dry stage: Secondary | ICD-10-CM | POA: Diagnosis not present

## 2020-03-22 DIAGNOSIS — I10 Essential (primary) hypertension: Secondary | ICD-10-CM | POA: Diagnosis not present

## 2020-03-22 DIAGNOSIS — Z0001 Encounter for general adult medical examination with abnormal findings: Secondary | ICD-10-CM | POA: Diagnosis not present

## 2020-03-22 DIAGNOSIS — I129 Hypertensive chronic kidney disease with stage 1 through stage 4 chronic kidney disease, or unspecified chronic kidney disease: Secondary | ICD-10-CM | POA: Diagnosis not present

## 2020-03-22 DIAGNOSIS — E875 Hyperkalemia: Secondary | ICD-10-CM | POA: Diagnosis not present

## 2020-03-22 DIAGNOSIS — K900 Celiac disease: Secondary | ICD-10-CM | POA: Diagnosis not present

## 2020-03-22 DIAGNOSIS — E1140 Type 2 diabetes mellitus with diabetic neuropathy, unspecified: Secondary | ICD-10-CM | POA: Diagnosis not present

## 2020-03-22 DIAGNOSIS — I639 Cerebral infarction, unspecified: Secondary | ICD-10-CM | POA: Diagnosis not present

## 2020-03-22 DIAGNOSIS — D631 Anemia in chronic kidney disease: Secondary | ICD-10-CM | POA: Diagnosis not present

## 2020-03-22 DIAGNOSIS — I4891 Unspecified atrial fibrillation: Secondary | ICD-10-CM | POA: Diagnosis not present

## 2020-03-22 DIAGNOSIS — I69351 Hemiplegia and hemiparesis following cerebral infarction affecting right dominant side: Secondary | ICD-10-CM | POA: Diagnosis not present

## 2020-03-22 DIAGNOSIS — E039 Hypothyroidism, unspecified: Secondary | ICD-10-CM | POA: Diagnosis not present

## 2020-03-22 DIAGNOSIS — E782 Mixed hyperlipidemia: Secondary | ICD-10-CM | POA: Diagnosis not present

## 2020-03-28 DIAGNOSIS — M25562 Pain in left knee: Secondary | ICD-10-CM | POA: Diagnosis not present

## 2020-03-28 DIAGNOSIS — M1712 Unilateral primary osteoarthritis, left knee: Secondary | ICD-10-CM | POA: Diagnosis not present

## 2020-04-07 DIAGNOSIS — E1142 Type 2 diabetes mellitus with diabetic polyneuropathy: Secondary | ICD-10-CM | POA: Diagnosis not present

## 2020-04-07 DIAGNOSIS — E119 Type 2 diabetes mellitus without complications: Secondary | ICD-10-CM | POA: Diagnosis not present

## 2020-04-11 DIAGNOSIS — I639 Cerebral infarction, unspecified: Secondary | ICD-10-CM | POA: Diagnosis not present

## 2020-04-11 DIAGNOSIS — I69351 Hemiplegia and hemiparesis following cerebral infarction affecting right dominant side: Secondary | ICD-10-CM | POA: Diagnosis not present

## 2020-04-11 DIAGNOSIS — E782 Mixed hyperlipidemia: Secondary | ICD-10-CM | POA: Diagnosis not present

## 2020-04-11 DIAGNOSIS — F5101 Primary insomnia: Secondary | ICD-10-CM | POA: Diagnosis not present

## 2020-04-11 DIAGNOSIS — D631 Anemia in chronic kidney disease: Secondary | ICD-10-CM | POA: Diagnosis not present

## 2020-04-11 DIAGNOSIS — I10 Essential (primary) hypertension: Secondary | ICD-10-CM | POA: Diagnosis not present

## 2020-04-11 DIAGNOSIS — K900 Celiac disease: Secondary | ICD-10-CM | POA: Diagnosis not present

## 2020-04-11 DIAGNOSIS — E875 Hyperkalemia: Secondary | ICD-10-CM | POA: Diagnosis not present

## 2020-04-11 DIAGNOSIS — E039 Hypothyroidism, unspecified: Secondary | ICD-10-CM | POA: Diagnosis not present

## 2020-04-11 DIAGNOSIS — Z0001 Encounter for general adult medical examination with abnormal findings: Secondary | ICD-10-CM | POA: Diagnosis not present

## 2020-04-11 DIAGNOSIS — E1140 Type 2 diabetes mellitus with diabetic neuropathy, unspecified: Secondary | ICD-10-CM | POA: Diagnosis not present

## 2020-04-11 DIAGNOSIS — I4891 Unspecified atrial fibrillation: Secondary | ICD-10-CM | POA: Diagnosis not present

## 2020-04-12 DIAGNOSIS — K900 Celiac disease: Secondary | ICD-10-CM | POA: Diagnosis not present

## 2020-04-12 DIAGNOSIS — E875 Hyperkalemia: Secondary | ICD-10-CM | POA: Diagnosis not present

## 2020-04-12 DIAGNOSIS — E1140 Type 2 diabetes mellitus with diabetic neuropathy, unspecified: Secondary | ICD-10-CM | POA: Diagnosis not present

## 2020-04-12 DIAGNOSIS — I129 Hypertensive chronic kidney disease with stage 1 through stage 4 chronic kidney disease, or unspecified chronic kidney disease: Secondary | ICD-10-CM | POA: Diagnosis not present

## 2020-04-12 DIAGNOSIS — N189 Chronic kidney disease, unspecified: Secondary | ICD-10-CM | POA: Diagnosis not present

## 2020-04-12 DIAGNOSIS — E1122 Type 2 diabetes mellitus with diabetic chronic kidney disease: Secondary | ICD-10-CM | POA: Diagnosis not present

## 2020-04-12 DIAGNOSIS — I10 Essential (primary) hypertension: Secondary | ICD-10-CM | POA: Diagnosis not present

## 2020-04-12 DIAGNOSIS — I69351 Hemiplegia and hemiparesis following cerebral infarction affecting right dominant side: Secondary | ICD-10-CM | POA: Diagnosis not present

## 2020-04-12 DIAGNOSIS — E782 Mixed hyperlipidemia: Secondary | ICD-10-CM | POA: Diagnosis not present

## 2020-04-12 DIAGNOSIS — Z0001 Encounter for general adult medical examination with abnormal findings: Secondary | ICD-10-CM | POA: Diagnosis not present

## 2020-04-12 DIAGNOSIS — I4891 Unspecified atrial fibrillation: Secondary | ICD-10-CM | POA: Diagnosis not present

## 2020-04-12 DIAGNOSIS — E039 Hypothyroidism, unspecified: Secondary | ICD-10-CM | POA: Diagnosis not present

## 2020-04-18 DIAGNOSIS — L89153 Pressure ulcer of sacral region, stage 3: Secondary | ICD-10-CM | POA: Diagnosis not present

## 2020-04-30 DIAGNOSIS — D638 Anemia in other chronic diseases classified elsewhere: Secondary | ICD-10-CM | POA: Diagnosis not present

## 2020-04-30 DIAGNOSIS — N189 Chronic kidney disease, unspecified: Secondary | ICD-10-CM | POA: Diagnosis not present

## 2020-04-30 DIAGNOSIS — E875 Hyperkalemia: Secondary | ICD-10-CM | POA: Diagnosis not present

## 2020-04-30 DIAGNOSIS — I10 Essential (primary) hypertension: Secondary | ICD-10-CM | POA: Diagnosis not present

## 2020-04-30 DIAGNOSIS — E211 Secondary hyperparathyroidism, not elsewhere classified: Secondary | ICD-10-CM | POA: Diagnosis not present

## 2020-04-30 DIAGNOSIS — E1122 Type 2 diabetes mellitus with diabetic chronic kidney disease: Secondary | ICD-10-CM | POA: Diagnosis not present

## 2020-05-02 DIAGNOSIS — R600 Localized edema: Secondary | ICD-10-CM | POA: Diagnosis not present

## 2020-05-02 DIAGNOSIS — L89152 Pressure ulcer of sacral region, stage 2: Secondary | ICD-10-CM | POA: Diagnosis not present

## 2020-05-02 DIAGNOSIS — I872 Venous insufficiency (chronic) (peripheral): Secondary | ICD-10-CM | POA: Diagnosis not present

## 2020-05-04 DIAGNOSIS — L89152 Pressure ulcer of sacral region, stage 2: Secondary | ICD-10-CM | POA: Diagnosis not present

## 2020-05-04 DIAGNOSIS — R600 Localized edema: Secondary | ICD-10-CM | POA: Diagnosis not present

## 2020-05-04 DIAGNOSIS — I872 Venous insufficiency (chronic) (peripheral): Secondary | ICD-10-CM | POA: Diagnosis not present

## 2020-05-04 DIAGNOSIS — N170 Acute kidney failure with tubular necrosis: Secondary | ICD-10-CM | POA: Diagnosis not present

## 2020-05-04 DIAGNOSIS — I10 Essential (primary) hypertension: Secondary | ICD-10-CM | POA: Diagnosis not present

## 2020-05-04 DIAGNOSIS — D638 Anemia in other chronic diseases classified elsewhere: Secondary | ICD-10-CM | POA: Diagnosis not present

## 2020-05-04 DIAGNOSIS — E875 Hyperkalemia: Secondary | ICD-10-CM | POA: Diagnosis not present

## 2020-05-04 DIAGNOSIS — E211 Secondary hyperparathyroidism, not elsewhere classified: Secondary | ICD-10-CM | POA: Diagnosis not present

## 2020-05-04 DIAGNOSIS — E1122 Type 2 diabetes mellitus with diabetic chronic kidney disease: Secondary | ICD-10-CM | POA: Diagnosis not present

## 2020-05-04 DIAGNOSIS — N189 Chronic kidney disease, unspecified: Secondary | ICD-10-CM | POA: Diagnosis not present

## 2020-05-07 DIAGNOSIS — I872 Venous insufficiency (chronic) (peripheral): Secondary | ICD-10-CM | POA: Diagnosis not present

## 2020-05-07 DIAGNOSIS — R600 Localized edema: Secondary | ICD-10-CM | POA: Diagnosis not present

## 2020-05-07 DIAGNOSIS — L89152 Pressure ulcer of sacral region, stage 2: Secondary | ICD-10-CM | POA: Diagnosis not present

## 2020-05-08 ENCOUNTER — Other Ambulatory Visit: Payer: Self-pay

## 2020-05-09 DIAGNOSIS — L89152 Pressure ulcer of sacral region, stage 2: Secondary | ICD-10-CM | POA: Diagnosis not present

## 2020-05-09 DIAGNOSIS — I872 Venous insufficiency (chronic) (peripheral): Secondary | ICD-10-CM | POA: Diagnosis not present

## 2020-05-09 DIAGNOSIS — R600 Localized edema: Secondary | ICD-10-CM | POA: Diagnosis not present

## 2020-05-11 DIAGNOSIS — M545 Low back pain: Secondary | ICD-10-CM | POA: Diagnosis not present

## 2020-05-14 DIAGNOSIS — I639 Cerebral infarction, unspecified: Secondary | ICD-10-CM | POA: Diagnosis not present

## 2020-05-14 DIAGNOSIS — E039 Hypothyroidism, unspecified: Secondary | ICD-10-CM | POA: Diagnosis not present

## 2020-05-14 DIAGNOSIS — E1140 Type 2 diabetes mellitus with diabetic neuropathy, unspecified: Secondary | ICD-10-CM | POA: Diagnosis not present

## 2020-05-14 DIAGNOSIS — I129 Hypertensive chronic kidney disease with stage 1 through stage 4 chronic kidney disease, or unspecified chronic kidney disease: Secondary | ICD-10-CM | POA: Diagnosis not present

## 2020-05-14 DIAGNOSIS — E875 Hyperkalemia: Secondary | ICD-10-CM | POA: Diagnosis not present

## 2020-05-14 DIAGNOSIS — I4891 Unspecified atrial fibrillation: Secondary | ICD-10-CM | POA: Diagnosis not present

## 2020-05-14 DIAGNOSIS — N189 Chronic kidney disease, unspecified: Secondary | ICD-10-CM | POA: Diagnosis not present

## 2020-05-14 DIAGNOSIS — I69351 Hemiplegia and hemiparesis following cerebral infarction affecting right dominant side: Secondary | ICD-10-CM | POA: Diagnosis not present

## 2020-05-14 DIAGNOSIS — K900 Celiac disease: Secondary | ICD-10-CM | POA: Diagnosis not present

## 2020-05-14 DIAGNOSIS — E1122 Type 2 diabetes mellitus with diabetic chronic kidney disease: Secondary | ICD-10-CM | POA: Diagnosis not present

## 2020-05-14 DIAGNOSIS — I10 Essential (primary) hypertension: Secondary | ICD-10-CM | POA: Diagnosis not present

## 2020-05-14 DIAGNOSIS — E782 Mixed hyperlipidemia: Secondary | ICD-10-CM | POA: Diagnosis not present

## 2020-05-15 DIAGNOSIS — E1142 Type 2 diabetes mellitus with diabetic polyneuropathy: Secondary | ICD-10-CM | POA: Diagnosis not present

## 2020-05-15 DIAGNOSIS — B351 Tinea unguium: Secondary | ICD-10-CM | POA: Diagnosis not present

## 2020-05-28 DIAGNOSIS — E119 Type 2 diabetes mellitus without complications: Secondary | ICD-10-CM | POA: Diagnosis not present

## 2020-06-11 DIAGNOSIS — E875 Hyperkalemia: Secondary | ICD-10-CM | POA: Diagnosis not present

## 2020-06-11 DIAGNOSIS — E211 Secondary hyperparathyroidism, not elsewhere classified: Secondary | ICD-10-CM | POA: Diagnosis not present

## 2020-06-11 DIAGNOSIS — D638 Anemia in other chronic diseases classified elsewhere: Secondary | ICD-10-CM | POA: Diagnosis not present

## 2020-06-11 DIAGNOSIS — I10 Essential (primary) hypertension: Secondary | ICD-10-CM | POA: Diagnosis not present

## 2020-06-11 DIAGNOSIS — N170 Acute kidney failure with tubular necrosis: Secondary | ICD-10-CM | POA: Diagnosis not present

## 2020-06-11 DIAGNOSIS — E1122 Type 2 diabetes mellitus with diabetic chronic kidney disease: Secondary | ICD-10-CM | POA: Diagnosis not present

## 2020-06-11 DIAGNOSIS — N189 Chronic kidney disease, unspecified: Secondary | ICD-10-CM | POA: Diagnosis not present

## 2020-06-15 DIAGNOSIS — E1122 Type 2 diabetes mellitus with diabetic chronic kidney disease: Secondary | ICD-10-CM | POA: Diagnosis not present

## 2020-06-15 DIAGNOSIS — D638 Anemia in other chronic diseases classified elsewhere: Secondary | ICD-10-CM | POA: Diagnosis not present

## 2020-06-15 DIAGNOSIS — N189 Chronic kidney disease, unspecified: Secondary | ICD-10-CM | POA: Diagnosis not present

## 2020-06-15 DIAGNOSIS — E211 Secondary hyperparathyroidism, not elsewhere classified: Secondary | ICD-10-CM | POA: Diagnosis not present

## 2020-06-15 DIAGNOSIS — I10 Essential (primary) hypertension: Secondary | ICD-10-CM | POA: Diagnosis not present

## 2020-06-22 DIAGNOSIS — I350 Nonrheumatic aortic (valve) stenosis: Secondary | ICD-10-CM | POA: Diagnosis not present

## 2020-06-22 DIAGNOSIS — L97921 Non-pressure chronic ulcer of unspecified part of left lower leg limited to breakdown of skin: Secondary | ICD-10-CM | POA: Diagnosis not present

## 2020-06-22 DIAGNOSIS — I4891 Unspecified atrial fibrillation: Secondary | ICD-10-CM | POA: Diagnosis not present

## 2020-06-22 DIAGNOSIS — N1831 Chronic kidney disease, stage 3a: Secondary | ICD-10-CM | POA: Diagnosis not present

## 2020-06-22 DIAGNOSIS — I872 Venous insufficiency (chronic) (peripheral): Secondary | ICD-10-CM | POA: Diagnosis not present

## 2020-06-22 DIAGNOSIS — N1832 Chronic kidney disease, stage 3b: Secondary | ICD-10-CM | POA: Diagnosis not present

## 2020-06-22 DIAGNOSIS — D649 Anemia, unspecified: Secondary | ICD-10-CM | POA: Diagnosis not present

## 2020-06-22 DIAGNOSIS — L97911 Non-pressure chronic ulcer of unspecified part of right lower leg limited to breakdown of skin: Secondary | ICD-10-CM | POA: Diagnosis not present

## 2020-06-22 DIAGNOSIS — D631 Anemia in chronic kidney disease: Secondary | ICD-10-CM | POA: Diagnosis not present

## 2020-06-22 DIAGNOSIS — I480 Paroxysmal atrial fibrillation: Secondary | ICD-10-CM | POA: Diagnosis not present

## 2020-06-22 DIAGNOSIS — L03116 Cellulitis of left lower limb: Secondary | ICD-10-CM | POA: Diagnosis not present

## 2020-06-22 DIAGNOSIS — L97811 Non-pressure chronic ulcer of other part of right lower leg limited to breakdown of skin: Secondary | ICD-10-CM | POA: Diagnosis not present

## 2020-06-28 DIAGNOSIS — I10 Essential (primary) hypertension: Secondary | ICD-10-CM | POA: Diagnosis not present

## 2020-06-28 DIAGNOSIS — I639 Cerebral infarction, unspecified: Secondary | ICD-10-CM | POA: Diagnosis not present

## 2020-06-28 DIAGNOSIS — K900 Celiac disease: Secondary | ICD-10-CM | POA: Diagnosis not present

## 2020-06-28 DIAGNOSIS — D631 Anemia in chronic kidney disease: Secondary | ICD-10-CM | POA: Diagnosis not present

## 2020-06-28 DIAGNOSIS — E1140 Type 2 diabetes mellitus with diabetic neuropathy, unspecified: Secondary | ICD-10-CM | POA: Diagnosis not present

## 2020-06-28 DIAGNOSIS — E782 Mixed hyperlipidemia: Secondary | ICD-10-CM | POA: Diagnosis not present

## 2020-06-28 DIAGNOSIS — G251 Drug-induced tremor: Secondary | ICD-10-CM | POA: Diagnosis not present

## 2020-06-28 DIAGNOSIS — I4891 Unspecified atrial fibrillation: Secondary | ICD-10-CM | POA: Diagnosis not present

## 2020-06-28 DIAGNOSIS — Z23 Encounter for immunization: Secondary | ICD-10-CM | POA: Diagnosis not present

## 2020-06-28 DIAGNOSIS — F5101 Primary insomnia: Secondary | ICD-10-CM | POA: Diagnosis not present

## 2020-06-28 DIAGNOSIS — E875 Hyperkalemia: Secondary | ICD-10-CM | POA: Diagnosis not present

## 2020-06-28 DIAGNOSIS — I129 Hypertensive chronic kidney disease with stage 1 through stage 4 chronic kidney disease, or unspecified chronic kidney disease: Secondary | ICD-10-CM | POA: Diagnosis not present

## 2020-06-28 DIAGNOSIS — E039 Hypothyroidism, unspecified: Secondary | ICD-10-CM | POA: Diagnosis not present

## 2020-06-28 DIAGNOSIS — I69351 Hemiplegia and hemiparesis following cerebral infarction affecting right dominant side: Secondary | ICD-10-CM | POA: Diagnosis not present

## 2020-07-09 DIAGNOSIS — X32XXXD Exposure to sunlight, subsequent encounter: Secondary | ICD-10-CM | POA: Diagnosis not present

## 2020-07-09 DIAGNOSIS — L570 Actinic keratosis: Secondary | ICD-10-CM | POA: Diagnosis not present

## 2020-07-09 DIAGNOSIS — L720 Epidermal cyst: Secondary | ICD-10-CM | POA: Diagnosis not present

## 2020-07-16 DIAGNOSIS — E782 Mixed hyperlipidemia: Secondary | ICD-10-CM | POA: Diagnosis not present

## 2020-07-16 DIAGNOSIS — E875 Hyperkalemia: Secondary | ICD-10-CM | POA: Diagnosis not present

## 2020-07-16 DIAGNOSIS — I69351 Hemiplegia and hemiparesis following cerebral infarction affecting right dominant side: Secondary | ICD-10-CM | POA: Diagnosis not present

## 2020-07-16 DIAGNOSIS — G251 Drug-induced tremor: Secondary | ICD-10-CM | POA: Diagnosis not present

## 2020-07-16 DIAGNOSIS — K900 Celiac disease: Secondary | ICD-10-CM | POA: Diagnosis not present

## 2020-07-16 DIAGNOSIS — I639 Cerebral infarction, unspecified: Secondary | ICD-10-CM | POA: Diagnosis not present

## 2020-07-16 DIAGNOSIS — E039 Hypothyroidism, unspecified: Secondary | ICD-10-CM | POA: Diagnosis not present

## 2020-07-16 DIAGNOSIS — E1140 Type 2 diabetes mellitus with diabetic neuropathy, unspecified: Secondary | ICD-10-CM | POA: Diagnosis not present

## 2020-07-16 DIAGNOSIS — F5101 Primary insomnia: Secondary | ICD-10-CM | POA: Diagnosis not present

## 2020-07-16 DIAGNOSIS — D631 Anemia in chronic kidney disease: Secondary | ICD-10-CM | POA: Diagnosis not present

## 2020-07-16 DIAGNOSIS — I4891 Unspecified atrial fibrillation: Secondary | ICD-10-CM | POA: Diagnosis not present

## 2020-07-16 DIAGNOSIS — I10 Essential (primary) hypertension: Secondary | ICD-10-CM | POA: Diagnosis not present

## 2020-07-20 DIAGNOSIS — E119 Type 2 diabetes mellitus without complications: Secondary | ICD-10-CM | POA: Diagnosis not present

## 2020-07-24 DIAGNOSIS — B351 Tinea unguium: Secondary | ICD-10-CM | POA: Diagnosis not present

## 2020-07-24 DIAGNOSIS — E1142 Type 2 diabetes mellitus with diabetic polyneuropathy: Secondary | ICD-10-CM | POA: Diagnosis not present

## 2020-08-13 DIAGNOSIS — E1122 Type 2 diabetes mellitus with diabetic chronic kidney disease: Secondary | ICD-10-CM | POA: Diagnosis not present

## 2020-08-13 DIAGNOSIS — E211 Secondary hyperparathyroidism, not elsewhere classified: Secondary | ICD-10-CM | POA: Diagnosis not present

## 2020-08-13 DIAGNOSIS — I10 Essential (primary) hypertension: Secondary | ICD-10-CM | POA: Diagnosis not present

## 2020-08-13 DIAGNOSIS — N189 Chronic kidney disease, unspecified: Secondary | ICD-10-CM | POA: Diagnosis not present

## 2020-08-13 DIAGNOSIS — D638 Anemia in other chronic diseases classified elsewhere: Secondary | ICD-10-CM | POA: Diagnosis not present

## 2020-08-16 DIAGNOSIS — N179 Acute kidney failure, unspecified: Secondary | ICD-10-CM | POA: Diagnosis not present

## 2020-08-16 DIAGNOSIS — E211 Secondary hyperparathyroidism, not elsewhere classified: Secondary | ICD-10-CM | POA: Diagnosis not present

## 2020-08-16 DIAGNOSIS — I10 Essential (primary) hypertension: Secondary | ICD-10-CM | POA: Diagnosis not present

## 2020-08-16 DIAGNOSIS — D638 Anemia in other chronic diseases classified elsewhere: Secondary | ICD-10-CM | POA: Diagnosis not present

## 2020-08-16 DIAGNOSIS — R600 Localized edema: Secondary | ICD-10-CM | POA: Diagnosis not present

## 2020-08-16 DIAGNOSIS — E1122 Type 2 diabetes mellitus with diabetic chronic kidney disease: Secondary | ICD-10-CM | POA: Diagnosis not present

## 2020-08-16 DIAGNOSIS — N189 Chronic kidney disease, unspecified: Secondary | ICD-10-CM | POA: Diagnosis not present

## 2020-08-29 NOTE — Progress Notes (Signed)
Cardiology Office Note  Date: 08/30/2020   ID: Mehdi, Gironda 09-05-1929, MRN [id number]  PCP:  Celene Squibb, MD  Cardiologist:  Rozann Lesches, MD Electrophysiologist:  None   Chief Complaint  Patient presents with  . Cardiac follow-up    History of Present Illness: [name] is an 86 y.o. male last seen in March by Ms. Barrett PA-C.  He is here today with his wife for a follow-up visit.  Overall no major change in stamina.  He is using a walker today.  No recent falls.  He does not report any sense of palpitations.  He continues to follow-up with Dr. Theador Hawthorne, I reviewed his most recent lab work.  It was recommended that he come off of Jardiance which had been started by PCP in light of his progressive renal insufficiency.  I reviewed his ECG today which shows normal sinus rhythm with prolonged PR interval, right bundle branch block, and leftward axis.  He does not report any sudden dizziness or syncope.  I reviewed his medications and most recent lab work, Eliquis is appropriately dosed.  He continues on low-dose Lopressor.  We also discussed getting a follow-up echocardiogram in comparison to study from last May at which point he had evidence of moderate aortic valve stenosis.  Past Medical History:  Diagnosis Date  . Allergic rhinitis   . Asthmatic bronchitis   . Atrial fibrillation ([place])   . Celiac disease   . Cellulitis   . CKD (chronic kidney disease)   . Diabetes ([place])   . Diverticulosis   . Fx. left wrist 1987  . Gastric polyps   . GERD (gastroesophageal reflux disease)   . History of pneumonia   . Hypertension   . Hypothyroidism   . Iron deficiency anemia   . Melanoma ([place])   . Neuropathy   . PAF (paroxysmal atrial fibrillation) ([place])   . Prostate cancer ([place])   . REM sleep behavior disorder   . RLS (restless legs syndrome)   . Sleep apnea   . Spasticity   . Stroke ([place]) 09/2017  . Tremor   . Type 2 diabetes mellitus ([place])     Past  Surgical History:  Procedure Laterality Date  . APPENDECTOMY  1978  . CARPAL TUNNEL RELEASE Left   . CATARACT EXTRACTION  03/2011   bilateral   . CHOLECYSTECTOMY  2001  . COLONOSCOPY W/ BIOPSIES  04/27/2008   diverticulosis, duodenitis  . FOOT SURGERY    . HERNIA REPAIR  1994   bilateral  . KNEE SURGERY Bilateral    x 2  . LUNG SURGERY  2001   for infection  . PROSTATECTOMY  2002  . UPPER GASTROINTESTINAL ENDOSCOPY  04/27/2008   celiac disease, gastric polyps    Current Outpatient Medications  Medication Sig Dispense Refill  . acetaminophen (TYLENOL) 325 MG tablet Take 650 mg by mouth every 6 (six) hours as needed. Takes 2 once daily    . amitriptyline (ELAVIL) 50 MG tablet Take 50 mg by mouth at bedtime.    Marland Kitchen apixaban (ELIQUIS) 2.5 MG TABS tablet Take 1 tablet (2.5 mg total) by mouth 2 (two) times daily. 60 tablet 6  . Ascorbic Acid (VITAMIN C) 1000 MG tablet Take 1,000 mg by mouth 2 (two) times daily.      Marland Kitchen atorvastatin (LIPITOR) 20 MG tablet Take 20 mg by mouth daily.    Marland Kitchen azelastine (ASTELIN) 0.1 % nasal spray Place 1 spray into both nostrils  at bedtime.     . Biotin 10 MG CAPS Take 10 mg by mouth daily.    . Cholecalciferol (VITAMIN D3) 1000 UNITS CAPS Take 3,000 Units by mouth 3 (three) times daily.     . diclofenac sodium (VOLTAREN) 1 % GEL Apply 2 g topically 4 (four) times daily. 1 Tube 0  . Fexofenadine HCl (MUCINEX ALLERGY PO) Take 1 tablet by mouth daily.    . fluticasone (FLONASE) 50 MCG/ACT nasal spray Place 1 spray into both nostrils daily.     . furosemide (LASIX) 40 MG tablet Take 80 mg by mouth 2 (two) times daily.    . Ginger, Zingiber officinalis, (GINGER ROOT) 550 MG CAPS Take by mouth. 2 daily    . glipiZIDE (GLUCOTROL XL) 10 MG 24 hr tablet 10 mg.    . hydrALAZINE (APRESOLINE) 100 MG tablet Take 1 tablet (100 mg total) by mouth 3 (three) times daily. 180 tablet 3  . JANUVIA 100 MG tablet 100 mg daily. morning    . levocetirizine (XYZAL) 5 MG tablet Take 5  mg by mouth at bedtime.     Marland Kitchen levothyroxine (SYNTHROID, LEVOTHROID) 150 MCG tablet Take 1 tablet (150 mcg total) by mouth daily before breakfast. 30 tablet 0  . MAGNESIUM MALATE PO Take 500 mg by mouth daily.    . Melatonin 3 MG TABS Take 3 mg by mouth at bedtime.     . metoprolol tartrate (LOPRESSOR) 25 MG tablet Take 75 mg by mouth 2 (two) times daily.    . Misc Natural Products (TART CHERRY ADVANCED PO) Take 425 mg by mouth. 2 daily    . pantoprazole (PROTONIX) 40 MG tablet Take 1 tablet (40 mg total) by mouth daily. 30 tablet 0  . polyethylene glycol (MIRALAX / GLYCOLAX) 17 g packet Take 17 g by mouth as needed.    Marland Kitchen tiZANidine (ZANAFLEX) 2 MG tablet      No current facility-administered medications for this visit.   Allergies:  Clindamycin, Gluten meal, Hydrocodone, Penicillins, Procaine, Sulfonamide derivatives, Tape, Wheat bran, Cephalexin, and Mold extract [trichophyton]   ROS: No syncope.  Physical Exam: VS:  BP (!) 138/58   Pulse 69   Ht 5\' 10"  (1.778 m)   Wt 212 lb (96.2 kg)   SpO2 92%   BMI 30.42 kg/m , BMI Body mass index is 30.42 kg/m.  Wt Readings from Last 3 Encounters:  08/30/20 212 lb (96.2 kg)  01/05/20 215 lb (97.5 kg)  11/01/19 218 lb 9.6 oz (99.2 kg)    General: Elderly male, using a walker. HEENT: Conjunctiva and lids normal, wearing a mask. Neck: Supple, no elevated JVP or carotid bruits, no thyromegaly. Lungs: Clear to auscultation, nonlabored breathing at rest. Cardiac: Regular rate and rhythm, no S3, 3/6 systolic murmur, no pericardial rub. Extremities: Mild ankle edema.  ECG:  An ECG dated 03/02/2018 was personally reviewed today and demonstrated:  Sinus bradycardia with right bundle branch block.  Recent Labwork:  September 2021: Hemoglobin 11.3, platelets 274, BUN 55, creatinine 1.79, potassium 5.0, AST 19, ALT 16, cholesterol 110, triglycerides 89, HDL 40, LDL 53, hemoglobin A1c 8.1%, TSH 0.273  Other Studies Reviewed Today:  Echocardiogram  02/25/2019: 1. The left ventricle has normal systolic function with an ejection  fraction of 60-65%. The cavity size was normal. There is mild concentric  left ventricular hypertrophy. Left ventricular diastolic Doppler  parameters are consistent with impaired  relaxation. Indeterminate filling pressures No evidence of left  ventricular regional wall motion abnormalities.  2. The right ventricle has normal systolic function. The cavity was  normal. There is no increase in right ventricular wall thickness.  3. Left atrial size was mildly dilated.  4. The mitral valve is grossly normal.  5. The tricuspid valve is grossly normal.  6. The aortic valve is tricuspid. Moderate thickening of the aortic  valve. Moderate calcification of the aortic valve. Aortic valve  regurgitation is trivial by color flow Doppler. Moderate stenosis of the  aortic valve.  7. Moderate aortic annular calcification.  8. The aortic root is normal in size and structure.   Assessment and Plan:  1.  Paroxysmal atrial fibrillation.  CHA2DS2-VASc score is 5.  She continues on Eliquis 2.5 mg twice daily along with Lopressor and remains asymptomatic in terms of palpitations.  He is in sinus rhythm today by ECG.  2.  Moderate calcific aortic stenosis by echocardiogram in May of last year.  We will obtain a follow-up echocardiogram.  3.  CKD stage IIIb, he continues to follow with Dr. Theador Hawthorne.  Medication Adjustments/Labs and Tests Ordered: Current medicines are reviewed at length with the patient today.  Concerns regarding medicines are outlined above.   Tests Ordered: Orders Placed This Encounter  Procedures  . EKG 12-Lead  . ECHOCARDIOGRAM COMPLETE    Medication Changes: No orders of the defined types were placed in this encounter.   Disposition:  Follow up 6 months in the [place] office.  Signed, Satira Sark, MD, [facility] 08/30/2020 10:41 AM    [facility] [place], [place], [place] [address] Phone: [phone]; Fax: [phone]

## 2020-08-30 ENCOUNTER — Ambulatory Visit: Payer: PPO | Admitting: Cardiology

## 2020-08-30 ENCOUNTER — Encounter: Payer: Self-pay | Admitting: Cardiology

## 2020-08-30 VITALS — BP 138/58 | HR 69 | Ht 70.0 in | Wt 212.0 lb

## 2020-08-30 DIAGNOSIS — E782 Mixed hyperlipidemia: Secondary | ICD-10-CM | POA: Diagnosis not present

## 2020-08-30 DIAGNOSIS — G251 Drug-induced tremor: Secondary | ICD-10-CM | POA: Diagnosis not present

## 2020-08-30 DIAGNOSIS — I639 Cerebral infarction, unspecified: Secondary | ICD-10-CM | POA: Diagnosis not present

## 2020-08-30 DIAGNOSIS — K900 Celiac disease: Secondary | ICD-10-CM | POA: Diagnosis not present

## 2020-08-30 DIAGNOSIS — I480 Paroxysmal atrial fibrillation: Secondary | ICD-10-CM

## 2020-08-30 DIAGNOSIS — N1832 Chronic kidney disease, stage 3b: Secondary | ICD-10-CM

## 2020-08-30 DIAGNOSIS — E039 Hypothyroidism, unspecified: Secondary | ICD-10-CM | POA: Diagnosis not present

## 2020-08-30 DIAGNOSIS — I350 Nonrheumatic aortic (valve) stenosis: Secondary | ICD-10-CM

## 2020-08-30 DIAGNOSIS — I4891 Unspecified atrial fibrillation: Secondary | ICD-10-CM | POA: Diagnosis not present

## 2020-08-30 DIAGNOSIS — E1140 Type 2 diabetes mellitus with diabetic neuropathy, unspecified: Secondary | ICD-10-CM | POA: Diagnosis not present

## 2020-08-30 DIAGNOSIS — D631 Anemia in chronic kidney disease: Secondary | ICD-10-CM | POA: Diagnosis not present

## 2020-08-30 DIAGNOSIS — E875 Hyperkalemia: Secondary | ICD-10-CM | POA: Diagnosis not present

## 2020-08-30 DIAGNOSIS — I10 Essential (primary) hypertension: Secondary | ICD-10-CM | POA: Diagnosis not present

## 2020-08-30 DIAGNOSIS — I69351 Hemiplegia and hemiparesis following cerebral infarction affecting right dominant side: Secondary | ICD-10-CM | POA: Diagnosis not present

## 2020-08-30 DIAGNOSIS — F5101 Primary insomnia: Secondary | ICD-10-CM | POA: Diagnosis not present

## 2020-08-30 NOTE — Patient Instructions (Addendum)
Medication Instructions:    Your physician recommends that you continue on your current medications as directed. Please refer to the Current Medication list given to you today.  Labwork:  None  Testing/Procedures: Your physician has requested that you have an echocardiogram at [facility]. Echocardiography is a painless test that uses sound waves to create images of your heart. It provides your doctor with information about the size and shape of your heart and how well your heart's chambers and valves are working. This procedure takes approximately one hour. There are no restrictions for this procedure.  Follow-Up:  Your physician recommends that you schedule a follow-up appointment in: 6 months at our [place] office.  Any Other Special Instructions Will Be Listed Below (If Applicable).  If you need a refill on your cardiac medications before your next appointment, please call your pharmacy.

## 2020-09-03 ENCOUNTER — Other Ambulatory Visit (HOSPITAL_COMMUNITY): Payer: PPO

## 2020-09-10 ENCOUNTER — Other Ambulatory Visit (HOSPITAL_COMMUNITY): Payer: PPO

## 2020-09-13 ENCOUNTER — Other Ambulatory Visit: Payer: Self-pay

## 2020-09-13 ENCOUNTER — Ambulatory Visit (HOSPITAL_COMMUNITY)
Admission: RE | Admit: 2020-09-13 | Discharge: 2020-09-13 | Disposition: A | Discharge status: Home / Self Care | Payer: PPO | Source: Ambulatory Visit | Attending: Cardiology | Admitting: Cardiology

## 2020-09-13 DIAGNOSIS — I350 Nonrheumatic aortic (valve) stenosis: Secondary | ICD-10-CM | POA: Insufficient documentation

## 2020-09-13 LAB — ECHOCARDIOGRAM COMPLETE
AR max vel: 0.93 cm2
AV Area VTI: 0.88 cm2
AV Area mean vel: 0.9 cm2
AV Mean grad: 27.7 mmHg
AV Peak grad: 43.1 mmHg
Ao pk vel: 3.28 m/s
Area-P 1/2: 2.03 cm2
P 1/2 time: 275 msec
S' Lateral: 2.16 cm

## 2020-09-13 NOTE — Progress Notes (Signed)
*  PRELIMINARY RESULTS* Echocardiogram 2D Echocardiogram has been performed.  [name] 09/13/2020, 4:12 PM

## 2020-09-19 DIAGNOSIS — E119 Type 2 diabetes mellitus without complications: Secondary | ICD-10-CM | POA: Diagnosis not present

## 2020-09-24 DIAGNOSIS — E211 Secondary hyperparathyroidism, not elsewhere classified: Secondary | ICD-10-CM | POA: Diagnosis not present

## 2020-09-24 DIAGNOSIS — I10 Essential (primary) hypertension: Secondary | ICD-10-CM | POA: Diagnosis not present

## 2020-09-24 DIAGNOSIS — N189 Chronic kidney disease, unspecified: Secondary | ICD-10-CM | POA: Diagnosis not present

## 2020-09-24 DIAGNOSIS — D638 Anemia in other chronic diseases classified elsewhere: Secondary | ICD-10-CM | POA: Diagnosis not present

## 2020-09-24 DIAGNOSIS — R600 Localized edema: Secondary | ICD-10-CM | POA: Diagnosis not present

## 2020-09-24 DIAGNOSIS — E1122 Type 2 diabetes mellitus with diabetic chronic kidney disease: Secondary | ICD-10-CM | POA: Diagnosis not present

## 2020-09-24 DIAGNOSIS — N179 Acute kidney failure, unspecified: Secondary | ICD-10-CM | POA: Diagnosis not present

## 2020-09-28 DIAGNOSIS — N189 Chronic kidney disease, unspecified: Secondary | ICD-10-CM | POA: Diagnosis not present

## 2020-09-28 DIAGNOSIS — D638 Anemia in other chronic diseases classified elsewhere: Secondary | ICD-10-CM | POA: Diagnosis not present

## 2020-09-28 DIAGNOSIS — E211 Secondary hyperparathyroidism, not elsewhere classified: Secondary | ICD-10-CM | POA: Diagnosis not present

## 2020-09-28 DIAGNOSIS — R600 Localized edema: Secondary | ICD-10-CM | POA: Diagnosis not present

## 2020-09-28 DIAGNOSIS — E1122 Type 2 diabetes mellitus with diabetic chronic kidney disease: Secondary | ICD-10-CM | POA: Diagnosis not present

## 2020-09-28 DIAGNOSIS — I10 Essential (primary) hypertension: Secondary | ICD-10-CM | POA: Diagnosis not present

## 2020-10-02 DIAGNOSIS — L97312 Non-pressure chronic ulcer of right ankle with fat layer exposed: Secondary | ICD-10-CM | POA: Diagnosis not present

## 2020-10-02 DIAGNOSIS — E1142 Type 2 diabetes mellitus with diabetic polyneuropathy: Secondary | ICD-10-CM | POA: Diagnosis not present

## 2020-10-02 DIAGNOSIS — B351 Tinea unguium: Secondary | ICD-10-CM | POA: Diagnosis not present

## 2020-10-10 DIAGNOSIS — L97911 Non-pressure chronic ulcer of unspecified part of right lower leg limited to breakdown of skin: Secondary | ICD-10-CM | POA: Diagnosis not present

## 2020-10-10 DIAGNOSIS — N1831 Chronic kidney disease, stage 3a: Secondary | ICD-10-CM | POA: Diagnosis not present

## 2020-10-10 DIAGNOSIS — L03116 Cellulitis of left lower limb: Secondary | ICD-10-CM | POA: Diagnosis not present

## 2020-10-10 DIAGNOSIS — I872 Venous insufficiency (chronic) (peripheral): Secondary | ICD-10-CM | POA: Diagnosis not present

## 2020-10-10 DIAGNOSIS — L97811 Non-pressure chronic ulcer of other part of right lower leg limited to breakdown of skin: Secondary | ICD-10-CM | POA: Diagnosis not present

## 2020-10-10 DIAGNOSIS — L97921 Non-pressure chronic ulcer of unspecified part of left lower leg limited to breakdown of skin: Secondary | ICD-10-CM | POA: Diagnosis not present

## 2020-10-10 DIAGNOSIS — I4891 Unspecified atrial fibrillation: Secondary | ICD-10-CM | POA: Diagnosis not present

## 2020-10-10 DIAGNOSIS — I350 Nonrheumatic aortic (valve) stenosis: Secondary | ICD-10-CM | POA: Diagnosis not present

## 2020-10-10 DIAGNOSIS — D631 Anemia in chronic kidney disease: Secondary | ICD-10-CM | POA: Diagnosis not present

## 2020-10-10 DIAGNOSIS — N1832 Chronic kidney disease, stage 3b: Secondary | ICD-10-CM | POA: Diagnosis not present

## 2020-10-10 DIAGNOSIS — I480 Paroxysmal atrial fibrillation: Secondary | ICD-10-CM | POA: Diagnosis not present

## 2020-10-10 DIAGNOSIS — D649 Anemia, unspecified: Secondary | ICD-10-CM | POA: Diagnosis not present

## 2020-10-12 DIAGNOSIS — I482 Chronic atrial fibrillation, unspecified: Secondary | ICD-10-CM | POA: Diagnosis not present

## 2020-10-12 DIAGNOSIS — E1122 Type 2 diabetes mellitus with diabetic chronic kidney disease: Secondary | ICD-10-CM | POA: Diagnosis not present

## 2020-10-12 DIAGNOSIS — K900 Celiac disease: Secondary | ICD-10-CM | POA: Diagnosis not present

## 2020-10-12 DIAGNOSIS — I10 Essential (primary) hypertension: Secondary | ICD-10-CM | POA: Diagnosis not present

## 2020-10-12 DIAGNOSIS — E875 Hyperkalemia: Secondary | ICD-10-CM | POA: Diagnosis not present

## 2020-10-12 DIAGNOSIS — E782 Mixed hyperlipidemia: Secondary | ICD-10-CM | POA: Diagnosis not present

## 2020-10-18 DIAGNOSIS — I10 Essential (primary) hypertension: Secondary | ICD-10-CM | POA: Diagnosis not present

## 2020-10-18 DIAGNOSIS — E1140 Type 2 diabetes mellitus with diabetic neuropathy, unspecified: Secondary | ICD-10-CM | POA: Diagnosis not present

## 2020-10-18 DIAGNOSIS — E782 Mixed hyperlipidemia: Secondary | ICD-10-CM | POA: Diagnosis not present

## 2020-10-18 DIAGNOSIS — I69351 Hemiplegia and hemiparesis following cerebral infarction affecting right dominant side: Secondary | ICD-10-CM | POA: Diagnosis not present

## 2020-10-18 DIAGNOSIS — E039 Hypothyroidism, unspecified: Secondary | ICD-10-CM | POA: Diagnosis not present

## 2020-10-18 DIAGNOSIS — I4891 Unspecified atrial fibrillation: Secondary | ICD-10-CM | POA: Diagnosis not present

## 2020-10-18 DIAGNOSIS — I129 Hypertensive chronic kidney disease with stage 1 through stage 4 chronic kidney disease, or unspecified chronic kidney disease: Secondary | ICD-10-CM | POA: Diagnosis not present

## 2020-10-18 DIAGNOSIS — K900 Celiac disease: Secondary | ICD-10-CM | POA: Diagnosis not present

## 2020-10-18 DIAGNOSIS — I639 Cerebral infarction, unspecified: Secondary | ICD-10-CM | POA: Diagnosis not present

## 2020-10-18 DIAGNOSIS — D631 Anemia in chronic kidney disease: Secondary | ICD-10-CM | POA: Diagnosis not present

## 2020-10-18 DIAGNOSIS — F5101 Primary insomnia: Secondary | ICD-10-CM | POA: Diagnosis not present

## 2020-10-18 DIAGNOSIS — E875 Hyperkalemia: Secondary | ICD-10-CM | POA: Diagnosis not present

## 2020-10-22 DIAGNOSIS — I10 Essential (primary) hypertension: Secondary | ICD-10-CM | POA: Diagnosis not present

## 2020-10-22 DIAGNOSIS — D638 Anemia in other chronic diseases classified elsewhere: Secondary | ICD-10-CM | POA: Diagnosis not present

## 2020-10-22 DIAGNOSIS — E1122 Type 2 diabetes mellitus with diabetic chronic kidney disease: Secondary | ICD-10-CM | POA: Diagnosis not present

## 2020-10-22 DIAGNOSIS — E211 Secondary hyperparathyroidism, not elsewhere classified: Secondary | ICD-10-CM | POA: Diagnosis not present

## 2020-10-22 DIAGNOSIS — R600 Localized edema: Secondary | ICD-10-CM | POA: Diagnosis not present

## 2020-10-22 DIAGNOSIS — N189 Chronic kidney disease, unspecified: Secondary | ICD-10-CM | POA: Diagnosis not present

## 2020-10-26 DIAGNOSIS — R600 Localized edema: Secondary | ICD-10-CM | POA: Diagnosis not present

## 2020-10-26 DIAGNOSIS — D638 Anemia in other chronic diseases classified elsewhere: Secondary | ICD-10-CM | POA: Diagnosis not present

## 2020-10-26 DIAGNOSIS — N189 Chronic kidney disease, unspecified: Secondary | ICD-10-CM | POA: Diagnosis not present

## 2020-10-26 DIAGNOSIS — E211 Secondary hyperparathyroidism, not elsewhere classified: Secondary | ICD-10-CM | POA: Diagnosis not present

## 2020-10-26 DIAGNOSIS — I10 Essential (primary) hypertension: Secondary | ICD-10-CM | POA: Diagnosis not present

## 2020-10-26 DIAGNOSIS — E1122 Type 2 diabetes mellitus with diabetic chronic kidney disease: Secondary | ICD-10-CM | POA: Diagnosis not present

## 2020-11-08 ENCOUNTER — Encounter (INDEPENDENT_AMBULATORY_CARE_PROVIDER_SITE_OTHER): Payer: PPO | Admitting: Ophthalmology

## 2020-11-13 DIAGNOSIS — I87311 Chronic venous hypertension (idiopathic) with ulcer of right lower extremity: Secondary | ICD-10-CM | POA: Diagnosis not present

## 2020-11-13 DIAGNOSIS — I129 Hypertensive chronic kidney disease with stage 1 through stage 4 chronic kidney disease, or unspecified chronic kidney disease: Secondary | ICD-10-CM | POA: Diagnosis not present

## 2020-11-13 DIAGNOSIS — R600 Localized edema: Secondary | ICD-10-CM | POA: Diagnosis not present

## 2020-11-13 DIAGNOSIS — E1140 Type 2 diabetes mellitus with diabetic neuropathy, unspecified: Secondary | ICD-10-CM | POA: Diagnosis not present

## 2020-11-15 ENCOUNTER — Other Ambulatory Visit: Payer: Self-pay

## 2020-11-15 ENCOUNTER — Encounter (INDEPENDENT_AMBULATORY_CARE_PROVIDER_SITE_OTHER): Payer: Self-pay | Admitting: Ophthalmology

## 2020-11-15 ENCOUNTER — Ambulatory Visit (INDEPENDENT_AMBULATORY_CARE_PROVIDER_SITE_OTHER): Payer: PPO | Admitting: Ophthalmology

## 2020-11-15 DIAGNOSIS — H43813 Vitreous degeneration, bilateral: Secondary | ICD-10-CM | POA: Diagnosis not present

## 2020-11-15 DIAGNOSIS — H353123 Nonexudative age-related macular degeneration, left eye, advanced atrophic without subfoveal involvement: Secondary | ICD-10-CM

## 2020-11-15 DIAGNOSIS — H353113 Nonexudative age-related macular degeneration, right eye, advanced atrophic without subfoveal involvement: Secondary | ICD-10-CM | POA: Diagnosis not present

## 2020-11-15 DIAGNOSIS — E119 Type 2 diabetes mellitus without complications: Secondary | ICD-10-CM | POA: Insufficient documentation

## 2020-11-15 NOTE — Assessment & Plan Note (Signed)

## 2020-11-15 NOTE — Assessment & Plan Note (Addendum)
The nature of dry age related macular degeneration was discussed with the patient as well as its possible conversion to wet. The results of the AREDS 2 study was discussed with the patient. A diet rich in dark leafy green vegetables was advised and specific recommendations were made regarding supplements with AREDS 2 formulation . Control of hypertension and serum cholesterol may slow the disease. Smoking cessation is mandatory to slow the disease and diminish the risk of progressing to wet age related macular degeneration. The patient was instructed in the use of an [place] and was told to return immediately for any changes in the Grid. Stressed to the patient do not rub eyesNo signs of CNVM by FFA or OCT.  Irregular outer layers of OCT suggest micro-CME yet not confirmed by FFA thus no signs of CNVM

## 2020-11-15 NOTE — Assessment & Plan Note (Signed)
No signs of CNVM by FFA or OCT.  Irregular outer layers of OCT suggest micro-CME yet not confirmed by FFA thus no signs of CNVM

## 2020-11-15 NOTE — Progress Notes (Signed)
11/15/2020     CHIEF COMPLAINT Patient presents for Retina Evaluation (NP Decreasing Macular Degeneration OU, ref'd by Dr. Gershon Crane.///Pt reports that when reading the words all run together, and floaters that come and go OU. Pt denies any flashes, pain, or pressure OU.  Symptoms started last summer and recently became more noticeable in the last few months. ///Last A1C: 7.9 10/2020                                         Last BS: 179 this AM )   HISTORY OF PRESENT ILLNESS: [name] is a 87 y.o. male who presents to the clinic today for:   HPI    Retina Evaluation    In both eyes.  This started 8 months ago.  Duration of 8 months.  Associated Symptoms Floaters.  Context:  distance vision and reading. Additional comments: NP Decreasing Macular Degeneration OU, ref'd by Dr. Gershon Crane.   Pt reports that when reading the words all run together, and floaters that come and go OU. Pt denies any flashes, pain, or pressure OU.  Symptoms started last summer and recently became more noticeable in the last few months.    Last A1C: 7.9 10/2020                                         Last BS: 179 this AM        Last edited by Nichola Sizer D on 11/15/2020  2:19 PM. (History)      Referring physician: Celene Squibb, MD [address],  [place] [address]  HISTORICAL INFORMATION:   Selected notes from the MEDICAL RECORD NUMBER    Lab Results  Component Value Date   HGBA1C 7.8 (H) 10/03/2017     CURRENT MEDICATIONS: No current outpatient medications on file. (Ophthalmic Drugs)   No current facility-administered medications for this visit. (Ophthalmic Drugs)   Current Outpatient Medications (Other)  Medication Sig  . acetaminophen (TYLENOL) 325 MG tablet Take 650 mg by mouth every 6 (six) hours as needed. Takes 2 once daily  . amitriptyline (ELAVIL) 50 MG tablet Take 50 mg by mouth at bedtime.  Marland Kitchen apixaban (ELIQUIS) 2.5 MG TABS tablet Take 1 tablet (2.5 mg total) by mouth 2  (two) times daily.  . Ascorbic Acid (VITAMIN C) 1000 MG tablet Take 1,000 mg by mouth 2 (two) times daily.    Marland Kitchen atorvastatin (LIPITOR) 20 MG tablet Take 20 mg by mouth daily.  Marland Kitchen azelastine (ASTELIN) 0.1 % nasal spray Place 1 spray into both nostrils at bedtime.   . Biotin 10 MG CAPS Take 10 mg by mouth daily.  . Cholecalciferol (VITAMIN D3) 1000 UNITS CAPS Take 3,000 Units by mouth 3 (three) times daily.   . diclofenac sodium (VOLTAREN) 1 % GEL Apply 2 g topically 4 (four) times daily.  Marland Kitchen Fexofenadine HCl (MUCINEX ALLERGY PO) Take 1 tablet by mouth daily.  . fluticasone (FLONASE) 50 MCG/ACT nasal spray Place 1 spray into both nostrils daily.   . furosemide (LASIX) 40 MG tablet Take 80 mg by mouth 2 (two) times daily.  . Ginger, Zingiber officinalis, (GINGER ROOT) 550 MG CAPS Take by mouth. 2 daily  . glipiZIDE (GLUCOTROL XL) 10 MG 24 hr tablet 10 mg.  Marland Kitchen  hydrALAZINE (APRESOLINE) 100 MG tablet Take 1 tablet (100 mg total) by mouth 3 (three) times daily.  Marland Kitchen JANUVIA 100 MG tablet 100 mg daily. morning  . levocetirizine (XYZAL) 5 MG tablet Take 5 mg by mouth at bedtime.   Marland Kitchen levothyroxine (SYNTHROID, LEVOTHROID) 150 MCG tablet Take 1 tablet (150 mcg total) by mouth daily before breakfast.  . MAGNESIUM MALATE PO Take 500 mg by mouth daily.  . Melatonin 3 MG TABS Take 3 mg by mouth at bedtime.   . metoprolol tartrate (LOPRESSOR) 25 MG tablet Take 75 mg by mouth 2 (two) times daily.  . Misc Natural Products (TART CHERRY ADVANCED PO) Take 425 mg by mouth. 2 daily  . pantoprazole (PROTONIX) 40 MG tablet Take 1 tablet (40 mg total) by mouth daily.  . polyethylene glycol (MIRALAX / GLYCOLAX) 17 g packet Take 17 g by mouth as needed.  Marland Kitchen tiZANidine (ZANAFLEX) 2 MG tablet    No current facility-administered medications for this visit. (Other)      REVIEW OF SYSTEMS:    ALLERGIES Allergies  Allergen Reactions  . Clindamycin Diarrhea  . Gluten Meal Diarrhea    No rye; no barley = HAS CELIAC  DISEASE  . Hydrocodone Other (See Comments)    Caused mental status changes and suffered withdrawals when he stopped it  . Penicillins Other (See Comments)    From childhood: Has patient had a PCN reaction causing immediate rash, facial/tongue/throat swelling, SOB or lightheadedness with hypotension: Unk Has patient had a PCN reaction causing severe rash involving mucus membranes or skin necrosis: Unk Has patient had a PCN reaction that required hospitalization: Unk Has patient had a PCN reaction occurring within the last 10 years: No If all of the above answers are "NO", then may proceed with Cephalosporin use.   . Procaine Other (See Comments)    "Passes out"  . Sulfonamide Derivatives Hives  . Tape Other (See Comments)    PLEASE USE AN ALTERNATIVE; LIKE COBAN WRAP; TAPE TEARS THE SKIN AND CAUSES BLISTERS!! PLEASE USE AN ALTERNATIVE; LIKE COBAN WRAP; TAPE TEARS THE SKIN AND CAUSES BLISTERS!!  . Wheat Bran Diarrhea    Pt family states pt has celiac disease  . Cephalexin Diarrhea and Nausea And Vomiting    Dry hives, "violent" diarhea  . Mold Extract [Trichophyton] Other (See Comments)    Stuffiness, post-nasal drip    PAST MEDICAL HISTORY Past Medical History:  Diagnosis Date  . Allergic rhinitis   . Asthmatic bronchitis   . Atrial fibrillation ([place])   . Celiac disease   . Cellulitis   . CKD (chronic kidney disease)   . Diabetes ([place])   . Diverticulosis   . Fx. left wrist 1987  . Gastric polyps   . GERD (gastroesophageal reflux disease)   . History of pneumonia   . Hypertension   . Hypothyroidism   . Iron deficiency anemia   . Melanoma ([place])   . Neuropathy   . PAF (paroxysmal atrial fibrillation) ([place])   . Prostate cancer ([place])   . REM sleep behavior disorder   . RLS (restless legs syndrome)   . Sleep apnea   . Spasticity   . Stroke ([place]) 09/2017  . Tremor   . Type 2 diabetes mellitus ([place])    Past Surgical History:  Procedure Laterality Date  . APPENDECTOMY   1978  . CARPAL TUNNEL RELEASE Left   . CATARACT EXTRACTION  03/2011   bilateral   . CHOLECYSTECTOMY  2001  . COLONOSCOPY  W/ BIOPSIES  04/27/2008   diverticulosis, duodenitis  . FOOT SURGERY    . HERNIA REPAIR  1994   bilateral  . KNEE SURGERY Bilateral    x 2  . LUNG SURGERY  2001   for infection  . PROSTATECTOMY  2002  . UPPER GASTROINTESTINAL ENDOSCOPY  04/27/2008   celiac disease, gastric polyps    FAMILY HISTORY Family History  Problem Relation Age of Onset  . Breast cancer Mother   . Kidney failure Father   . Heart disease Father   . Rheumatic fever Father   . Colon cancer Other     SOCIAL HISTORY Social History   Tobacco Use  . Smoking status: Never Smoker  . Smokeless tobacco: Never Used  Vaping Use  . Vaping Use: Never used  Substance Use Topics  . Alcohol use: Never  . Drug use: Never         OPHTHALMIC EXAM:  Base Eye Exam    Visual Acuity (ETDRS)      Right Left   Dist cc 20/60 +1 20/70   Dist ph cc 20/50 -2 20/50 -1   Correction: Glasses       Tonometry (Tonopen, 2:27 PM)      Right Left   Pressure 16 13       Pupils      Pupils Dark Light Shape React APD   Right PERRL 4 3 Round Brisk None   Left PERRL 3 2 Round Brisk None       Visual Fields (Counting fingers)      Left Right    Full    Restrictions  Partial outer superior nasal deficiency       Extraocular Movement      Right Left    Full Full       Neuro/Psych    Oriented x3: Yes       Dilation    Both eyes: 1.0% Mydriacyl, 2.5% Phenylephrine @ 2:27 PM        Slit Lamp and Fundus Exam    External Exam      Right Left   External Normal Normal       Slit Lamp Exam      Right Left   Lids/Lashes Normal Normal   Conjunctiva/Sclera White and quiet White and quiet   Cornea Clear Clear   Anterior Chamber Deep and quiet Deep and quiet   Iris Round and reactive Round and reactive   Lens Centered posterior chamber intraocular lens Centered posterior chamber  intraocular lens   Anterior Vitreous Normal Normal       Fundus Exam      Right Left   Posterior Vitreous Posterior vitreous detachment Posterior vitreous detachment   Disc Normal Normal   C/D Ratio 0.25 0.4   Macula Geographic atrophy, Hard drusen, Retinal pigment epithelial mottling, no hemorrhage Geographic atrophy, splits the FAZ, Hard drusen, Retinal pigment epithelial mottling, no hemorrhage   Vessels no DR no DR   Periphery Normal Normal          IMAGING AND PROCEDURES  Imaging and Procedures for 11/15/20  OCT, Retina - OU - Both Eyes       Right Eye Quality was good. Scan locations included subfoveal. Central Foveal Thickness: 237. Progression has no prior data. Findings include abnormal foveal contour, no IRF, no SRF, outer retinal atrophy, central retinal atrophy.   Left Eye Quality was good. Scan locations included subfoveal. Central Foveal Thickness: 258. Progression has no prior data.  Findings include abnormal foveal contour, no SRF, no IRF, central retinal atrophy, outer retinal atrophy.        Fluorescein Angiography Optos (Transit OD)       Right Eye   Progression has no prior data. Mid/Late phase findings include window defect. Choroidal neovascularization is not present.   Left Eye   Progression has no prior data. Mid/Late phase findings include window defect. Choroidal neovascularization is not present.   Notes No signs of CNVM in the macular region.  Extensive regions of geographic RPE dropout choriocapillaris dropout with RPE window defects throughout the posterior pole.       Color Fundus Photography Optos - OU - Both Eyes       Right Eye Progression has no prior data. Disc findings include normal observations. Macula : geographic atrophy, retinal pigment epithelium abnormalities. Vessels : normal observations. Periphery : normal observations.   Left Eye Progression has no prior data. Disc findings include normal observations. Macula :  geographic atrophy, retinal pigment epithelium abnormalities. Vessels : normal observations.   Notes Geographic atrophy in of foveal and perifoveal location.  No clinical signs of CNVM by color fundus photography                ASSESSMENT/PLAN:  Advanced nonexudative age-related macular degeneration of right eye without subfoveal involvement No signs of CNVM by FFA or OCT.  Irregular outer layers of OCT suggest micro-CME yet not confirmed by FFA thus no signs of CNVM  Advanced nonexudative age-related macular degeneration of left eye without subfoveal involvement The nature of dry age related macular degeneration was discussed with the patient as well as its possible conversion to wet. The results of the AREDS 2 study was discussed with the patient. A diet rich in dark leafy green vegetables was advised and specific recommendations were made regarding supplements with AREDS 2 formulation . Control of hypertension and serum cholesterol may slow the disease. Smoking cessation is mandatory to slow the disease and diminish the risk of progressing to wet age related macular degeneration. The patient was instructed in the use of an [place] and was told to return immediately for any changes in the Grid. Stressed to the patient do not rub eyesNo signs of CNVM by FFA or OCT.  Irregular outer layers of OCT suggest micro-CME yet not confirmed by FFA thus no signs of CNVM  Diabetes mellitus without complication ([place]) The patient has diabetes without any evidence of retinopathy. The patient advised to maintain good blood glucose control, excellent blood pressure control, and favorable levels of cholesterol, low density lipoprotein, and high density lipoproteins. Follow up in 1 year was recommended. Explained that fluctuations in visual acuity , or "out of focus", may result from large variations of blood sugar control.      ICD-10-CM   1. Advanced nonexudative age-related macular degeneration of  right eye without subfoveal involvement  H35.3113 OCT, Retina - OU - Both Eyes    Fluorescein Angiography Optos (Transit OD)    Color Fundus Photography Optos - OU - Both Eyes  2. Advanced nonexudative age-related macular degeneration of left eye without subfoveal involvement  H35.3123 OCT, Retina - OU - Both Eyes    Fluorescein Angiography Optos (Transit OD)    Color Fundus Photography Optos - OU - Both Eyes  3. Diabetes mellitus without complication ([place])  O35.0   4. Posterior vitreous detachment of both eyes  H43.813     1.  2.  3.  Ophthalmic Meds Ordered this  visit:  No orders of the defined types were placed in this encounter.      Return in about 4 months (around 03/15/2021) for DILATE OU, OCT.  There are no Patient Instructions on file for this visit.   Explained the diagnoses, plan, and follow up with the patient and they expressed understanding.  Patient expressed understanding of the importance of proper follow up care.   Clent Demark Wells Mabe M.D. Diseases & Surgery of the Retina and Vitreous [facility] [place] 11/15/20     Abbreviations: M myopia (nearsighted); A astigmatism; H hyperopia (farsighted); P presbyopia; Mrx spectacle prescription;  CTL contact lenses; OD right eye; OS left eye; OU both eyes  XT exotropia; ET esotropia; PEK punctate epithelial keratitis; PEE punctate epithelial erosions; DES dry eye syndrome; MGD meibomian gland dysfunction; ATs artificial tears; PFAT's preservative free artificial tears; [place] nuclear sclerotic cataract; PSC posterior subcapsular cataract; ERM epi-retinal membrane; PVD posterior vitreous detachment; RD retinal detachment; DM diabetes mellitus; DR diabetic retinopathy; NPDR non-proliferative diabetic retinopathy; PDR proliferative diabetic retinopathy; CSME clinically significant macular edema; DME diabetic macular edema; dbh dot blot hemorrhages; CWS cotton wool spot; POAG primary open angle glaucoma; C/D cup-to-disc  ratio; HVF humphrey visual field; GVF goldmann visual field; OCT optical coherence tomography; IOP intraocular pressure; BRVO Branch retinal vein occlusion; CRVO central retinal vein occlusion; CRAO central retinal artery occlusion; BRAO branch retinal artery occlusion; RT retinal tear; SB scleral buckle; PPV pars plana vitrectomy; VH Vitreous hemorrhage; PRP panretinal laser photocoagulation; IVK intravitreal kenalog; VMT vitreomacular traction; MH Macular hole;  NVD neovascularization of the disc; NVE neovascularization elsewhere; AREDS age related eye disease study; ARMD age related macular degeneration; POAG primary open angle glaucoma; EBMD epithelial/anterior basement membrane dystrophy; ACIOL anterior chamber intraocular lens; IOL intraocular lens; PCIOL posterior chamber intraocular lens; Phaco/IOL phacoemulsification with intraocular lens placement; [place] photorefractive keratectomy; LASIK laser assisted in situ keratomileusis; HTN hypertension; DM diabetes mellitus; COPD chronic obstructive pulmonary disease

## 2020-11-16 DIAGNOSIS — E119 Type 2 diabetes mellitus without complications: Secondary | ICD-10-CM | POA: Diagnosis not present

## 2020-11-16 DIAGNOSIS — M1711 Unilateral primary osteoarthritis, right knee: Secondary | ICD-10-CM | POA: Diagnosis not present

## 2020-11-16 DIAGNOSIS — M1712 Unilateral primary osteoarthritis, left knee: Secondary | ICD-10-CM | POA: Diagnosis not present

## 2020-12-03 DIAGNOSIS — I10 Essential (primary) hypertension: Secondary | ICD-10-CM | POA: Diagnosis not present

## 2020-12-03 DIAGNOSIS — R600 Localized edema: Secondary | ICD-10-CM | POA: Diagnosis not present

## 2020-12-03 DIAGNOSIS — N189 Chronic kidney disease, unspecified: Secondary | ICD-10-CM | POA: Diagnosis not present

## 2020-12-03 DIAGNOSIS — E211 Secondary hyperparathyroidism, not elsewhere classified: Secondary | ICD-10-CM | POA: Diagnosis not present

## 2020-12-03 DIAGNOSIS — D638 Anemia in other chronic diseases classified elsewhere: Secondary | ICD-10-CM | POA: Diagnosis not present

## 2020-12-03 DIAGNOSIS — E1122 Type 2 diabetes mellitus with diabetic chronic kidney disease: Secondary | ICD-10-CM | POA: Diagnosis not present

## 2020-12-04 ENCOUNTER — Other Ambulatory Visit: Payer: Self-pay

## 2020-12-04 ENCOUNTER — Encounter (HOSPITAL_COMMUNITY): Payer: Self-pay | Admitting: Physical Therapy

## 2020-12-04 ENCOUNTER — Ambulatory Visit (HOSPITAL_COMMUNITY): Payer: PPO | Attending: Internal Medicine | Admitting: Physical Therapy

## 2020-12-04 DIAGNOSIS — L97811 Non-pressure chronic ulcer of other part of right lower leg limited to breakdown of skin: Secondary | ICD-10-CM | POA: Diagnosis not present

## 2020-12-04 DIAGNOSIS — R2689 Other abnormalities of gait and mobility: Secondary | ICD-10-CM | POA: Insufficient documentation

## 2020-12-04 NOTE — Therapy (Signed)
[place], [place], [address] Phone: [phone]   Fax:  [phone]  Wound Care Evaluation  Patient Details  Name: [name] MRN: [id number] Date of Birth: 20-Oct-1928 Referring Provider (PT): [name]   Encounter Date: 12/04/2020   PT End of Session - 12/04/20 1643    Visit Number 1    Number of Visits 12    Date for PT Re-Evaluation 01/15/21    Authorization Type [employer] (no auth req, no visit limit)    Progress Note Due on Visit 10    PT Start Time 1532    PT Stop Time 1615    PT Time Calculation (min) 43 min    Activity Tolerance Patient tolerated treatment well    Behavior During Therapy [facility] for tasks assessed/performed           Past Medical History:  Diagnosis Date  . Allergic rhinitis   . Asthmatic bronchitis   . Atrial fibrillation ([place])   . Celiac disease   . Cellulitis   . CKD (chronic kidney disease)   . Diabetes ([place])   . Diverticulosis   . Fx. left wrist 1987  . Gastric polyps   . GERD (gastroesophageal reflux disease)   . History of pneumonia   . Hypertension   . Hypothyroidism   . Iron deficiency anemia   . Melanoma ([place])   . Neuropathy   . PAF (paroxysmal atrial fibrillation) ([place])   . Prostate cancer ([place])   . REM sleep behavior disorder   . RLS (restless legs syndrome)   . Sleep apnea   . Spasticity   . Stroke ([place]) 09/2017  . Tremor   . Type 2 diabetes mellitus ([place])     Past Surgical History:  Procedure Laterality Date  . APPENDECTOMY  1978  . CARPAL TUNNEL RELEASE Left   . CATARACT EXTRACTION  03/2011   bilateral   . CHOLECYSTECTOMY  2001  . COLONOSCOPY W/ BIOPSIES  04/27/2008   diverticulosis, duodenitis  . FOOT SURGERY    . HERNIA REPAIR  1994   bilateral  . KNEE SURGERY Bilateral    x 2  . LUNG SURGERY  2001   for infection  . PROSTATECTOMY  2002  . UPPER GASTROINTESTINAL ENDOSCOPY  04/27/2008   celiac disease, gastric polyps     There were no vitals filed for this visit.   Subjective Assessment - 12/04/20 1605    Subjective Pt wife states that her husband has had chronic wounds for years, he has been seen at this clinic years ago.  At that time she was taught how to take care of her husband wounds and she has been doing so for years and they always healed up.  He has had a chronic wound on the outside of his RT leg since Thanksgiving but it keeps getting worse, therefore they have been referred to skiled PT.    Currently in Pain? --   denies pain due to peripheral neuropathy.           [facility] PT Assessment - 12/04/20 0001      Assessment   Medical Diagnosis Chronic venous stasis wound    Referring Provider (PT) [name]    Onset Date/Surgical Date 09/06/20    Next MD Visit unknown    Prior Therapy self care, MD care      Precautions   Precautions --   cellulitis     Balance Screen  Has the patient fallen in the past 6 months Yes    How many times? 1    Has the patient had a decrease in activity level because of a fear of falling?  Yes    Is the patient reluctant to leave their home because of a fear of falling?  No      Home Environment   Living Environment Private residence      Prior Function   Level of Independence Independent with household mobility with device      Cognition   Overall Cognitive Status Within Functional Limits for tasks assessed           Wound Therapy - 12/04/20 0001    Subjective see above    Patient and Family Stated Goals wound to heal    Date of Onset 09/06/20    Prior Treatments MD and self care    Pain Scale 0-10   peripheral neuropathy   Evaluation and Treatment Procedures Explained to Patient/Family Yes    Evaluation and Treatment Procedures agreed to;Other (comment)    Wound Properties Date First Assessed: 12/04/20 Time First Assessed: 1545 Wound Type: Venous stasis ulcer Location: Leg Location Orientation: Right Present on Admission: Yes   Wound Image  View All Images View Images    Dressing Type None    Dressing Changed New    Dressing Status None    Dressing Change Frequency PRN    Site / Wound Assessment Yellow    % Wound base Yellow/Fibrinous Exudate 100%    % Wound base Black/Eschar 100%    % Wound base Other/Granulation Tissue (Comment) 0%    Peri-wound Assessment Edema;Erythema (blanchable)    Wound Length (cm) 3.5 cm    Wound Width (cm) 2.8 cm    Wound Depth (cm) 0.3 cm    Wound Volume (cm^3) 2.94 cm^3    Wound Surface Area (cm^2) 9.8 cm^2    Margins Unattached edges (unapproximated)    Wound Therapy - Clinical Statement Patient with R lateral tibial wound completely covered with very adherent slough. Patient with hemosiderin staining, dry flaky skin, and weeping in RLE. Patient experiencing minimal pain secondary to peripheral edema. Attempted debridement of slough with minimal amounts able to be removed. Completed cross hatching of slough following debridement to loosen remaining. Applied Vaseline to periwound, medihoney on gauze, 4x4 and profore light with extra cotton.  Patient and wife educated on importance of compression for wound healing and to avoid wounds from forming. Submitting referral for pump and for juxtafit due to patient's inability to manage wounds and don compression garments with continued LE edema forming. Patient will benefit from skilled physical therapy in order to establish and maintain a healthy environment for wound healing.    Wound Therapy - Functional Problem List gait and balance    Factors Delaying/Impairing Wound Healing Diabetes Mellitus;Vascular compromise;Altered sensation    Wound Therapy - Frequency 2X / week    Wound Therapy - Current Recommendations --   pumb and juxtafit   Wound Plan continue to debride, change dressings, and reduce LE edema to promote wound healing                 Objective measurements completed on examination: See above findings.             PT  Education - 12/04/20 1642    Education Details educated on slough, debridement, and importance of compression    Person(s) Educated Patient;Spouse    Methods Explanation  Comprehension Verbalized understanding            PT Short Term Goals - 12/04/20 1703      PT SHORT TERM GOAL #1   Title Patient will have at least 50% granualtion tissue.    Time 3    Period Weeks    Status New    Target Date 12/25/20             PT Long Term Goals - 12/04/20 1704      PT LONG TERM GOAL #1   Title Patient's wound will be healed to reduce the risk of infection.    Time 6    Period Weeks    Status New    Target Date 01/15/21      PT LONG TERM GOAL #2   Title Patient will be able to verbalize completion of daily skin checks to reduce the risk of further wound development.    Time 6    Period Weeks    Status New    Target Date 01/15/21                Plan - 12/04/20 1700    Clinical Impression Statement see above    Personal Factors and Comorbidities Age;Comorbidity 3+;Past/Current Experience;Fitness;Time since onset of injury/illness/exacerbation    Comorbidities DM, CKD, neuorpathy, Hx CVA    Examination-Activity Limitations Locomotion Level;Transfers;Squat;Stand;Dressing    Examination-Participation Restrictions Community Activity    Stability/Clinical Decision Making Stable/Uncomplicated    Clinical Decision Making Low    Rehab Potential Good    PT Frequency 2x / week    PT Duration 6 weeks    PT Treatment/Interventions Gait training;Stair training;Functional mobility training;DME Instruction;Therapeutic activities;Therapeutic exercise;Balance training;Neuromuscular re-education;Patient/family education;Compression bandaging;Manual lymph drainage;Manual techniques    PT Next Visit Plan continue to debride PRN and change dressings to promote wound healing    Consulted and Agree with Plan of Care Patient;Family member/caregiver    Family Member Consulted wife            Patient will benefit from skilled therapeutic intervention in order to improve the following deficits and impairments:  Abnormal gait,Decreased skin integrity,Decreased mobility,Impaired sensation  Visit Diagnosis: Other abnormalities of gait and mobility  Ulcer of right pretibial region, limited to breakdown of skin [facility])    Problem List Patient Active Problem List   Diagnosis Date Noted  . Advanced nonexudative age-related macular degeneration of right eye without subfoveal involvement 11/15/2020  . Advanced nonexudative age-related macular degeneration of left eye without subfoveal involvement 11/15/2020  . Diabetes mellitus without complication ([place]) 78/93/8101  . Posterior vitreous detachment of both eyes 11/15/2020  . Gait disturbance, post-stroke 11/02/2017  . Aphasia as late effect of cerebrovascular accident 11/02/2017  . 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 Aphasia   . PAF (paroxysmal atrial fibrillation) ([place])   . Diabetes mellitus type 2 in obese ([place])   . Benign essential HTN   . Hypothyroidism   . Hyperlipidemia   . Stroke (cerebrum) ([place]) 10/02/2017  . Afib ([place]) 01/10/2017  . SOB (shortness of breath) 01/10/2017  . Acute diastolic CHF (congestive heart failure) ([place]) 01/10/2017  . Atrial fibrillation with RVR ([place]) 01/10/2017  . Claudication ([place]) 07/25/2016  . Paroxysmal atrial fibrillation ([place]) 07/25/2016  . Odynophagia 04/10/2014  . Allergic rhinitis 03/23/2014  . Chronic rhinitis 12/22/2013  . 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 Constipation 08/22/2011  . Obesity 09/20/2009  . G E R D 11/29/2007  . Celiac disease 11/29/2007  5:08 PM, 12/04/20 Mearl Latin PT, DPT Physical Therapist at [place], [place], [address] Phone: [phone]   Fax:  [phone]  Name: [name] MRN: [id number] Date of Birth:  1929/10/13

## 2020-12-06 ENCOUNTER — Other Ambulatory Visit: Payer: Self-pay

## 2020-12-06 ENCOUNTER — Ambulatory Visit (HOSPITAL_COMMUNITY): Payer: PPO | Admitting: Physical Therapy

## 2020-12-06 DIAGNOSIS — R2689 Other abnormalities of gait and mobility: Secondary | ICD-10-CM | POA: Diagnosis not present

## 2020-12-06 DIAGNOSIS — L97811 Non-pressure chronic ulcer of other part of right lower leg limited to breakdown of skin: Secondary | ICD-10-CM

## 2020-12-06 NOTE — Therapy (Addendum)
[place], [place], [address] Phone: [phone]   Fax:  [phone]  Wound Care Therapy  Patient Details  Name: [name] MRN: [id number] Date of Birth: [birth year] Referring Provider (PT): [name]   Encounter Date: 12/06/2020   PT End of Session - 12/06/20 1234    Visit Number 2    Number of Visits 12    Date for PT Re-Evaluation 01/15/21    Authorization Type [employer] (no auth req, no visit limit)    Progress Note Due on Visit 10    PT Start Time 1140    PT Stop Time 1220    PT Time Calculation (min) 40 min    Activity Tolerance Patient tolerated treatment well    Behavior During Therapy [facility] for tasks assessed/performed           Past Medical History:  Diagnosis Date  . Allergic rhinitis   . Asthmatic bronchitis   . Atrial fibrillation ([place])   . Celiac disease   . Cellulitis   . CKD (chronic kidney disease)   . Diabetes ([place])   . Diverticulosis   . Fx. left wrist 1987  . Gastric polyps   . GERD (gastroesophageal reflux disease)   . History of pneumonia   . Hypertension   . Hypothyroidism   . Iron deficiency anemia   . Melanoma ([place])   . Neuropathy   . PAF (paroxysmal atrial fibrillation) ([place])   . Prostate cancer ([place])   . REM sleep behavior disorder   . RLS (restless legs syndrome)   . Sleep apnea   . Spasticity   . Stroke ([place]) 09/2017  . Tremor   . Type 2 diabetes mellitus ([place])     Past Surgical History:  Procedure Laterality Date  . APPENDECTOMY  1978  . CARPAL TUNNEL RELEASE Left   . CATARACT EXTRACTION  03/2011   bilateral   . CHOLECYSTECTOMY  2001  . COLONOSCOPY W/ BIOPSIES  04/27/2008   diverticulosis, duodenitis  . FOOT SURGERY    . HERNIA REPAIR  1994   bilateral  . KNEE SURGERY Bilateral    x 2  . LUNG SURGERY  2001   for infection  . PROSTATECTOMY  2002  . UPPER GASTROINTESTINAL ENDOSCOPY  04/27/2008   celiac disease, gastric polyps     There were no vitals filed for this visit.               Wound Therapy - 12/06/20 0001    Subjective PT states that his dressing stayed on and felt fine.    Patient and Family Stated Goals wound to heal    Date of Onset 09/06/20    Prior Treatments MD and self care    Evaluation and Treatment Procedures Explained to Patient/Family Yes    Evaluation and Treatment Procedures agreed to;Other (comment)    Wound Properties Date First Assessed: 12/06/20 Time First Assessed: 1140 Location: Pretibial Location Orientation: Left;Proximal Wound Description (Comments): superfical wound but LE is edematous and red Present on Admission: Yes   Dressing Type None    Dressing Changed New    Dressing Status None    Site / Wound Assessment Clean;Granulation tissue;Yellow    % Wound base Red or Granulating 60%    % Wound base Yellow/Fibrinous Exudate 40%    Peri-wound Assessment Erythema (blanchable);Edema    Wound Length (cm) 1.4 cm    Wound Width (cm) 0.8 cm    Wound  Surface Area (cm^2) 1.12 cm^2    Drainage Amount Scant    Drainage Description Serous    Treatment Cleansed;Other (Comment)   xeroform with profore lite compression bandage applied   Wound Properties Date First Assessed: 12/04/20 Time First Assessed: 1545 Wound Type: Venous stasis ulcer Location: Leg Location Orientation: Right Present on Admission: Yes   Dressing Type Compression wrap;Other (Comment)   medihoney   Dressing Changed Changed    Dressing Status Old drainage    Dressing Change Frequency PRN    Site / Wound Assessment Yellow;Pink    % Wound base Yellow/Fibrinous Exudate 90%    % Wound base Black/Eschar 10%    Peri-wound Assessment Edema;Erythema (blanchable)    Drainage Amount Minimal    Drainage Description Serous    Treatment Cleansed;Debridement (Selective)    Selective Debridement - Location entire wound bed of Rt LE    Selective Debridement - Tools Used Forceps;Scalpel    Selective Debridement -  Tissue Removed slough    Wound Therapy - Clinical Statement Therapist assessed Lt LE as pt states that there is a wound on this leg as well.  This wound will not need debridement but would benefit from profore lite compression dressing to decrease the edema in his LE.  Noted small increase of granulating tissue on RT LE. Wound debrided prior to dressing change.  Pt will continue to benefit from skilled PT to ensure a good healing environment is maintained.    Wound Therapy - Functional Problem List gait and balance    Factors Delaying/Impairing Wound Healing Diabetes Mellitus;Vascular compromise;Altered sensation    Wound Therapy - Frequency 2X / week    Wound Therapy - Current Recommendations --   pump and juxtafit   Wound Plan continue to debride, change dressings, and reduce LE edema to promote wound healing                     PT Short Term Goals - 12/06/20 1235      PT SHORT TERM GOAL #1   Title Patient will have at least 50% granualtion tissue.    Time 3    Period Weeks    Status On-going    Target Date 12/25/20             PT Long Term Goals - 12/06/20 1235      PT LONG TERM GOAL #1   Title Patient's wound will be healed to reduce the risk of infection.    Time 6    Period Weeks    Status On-going      PT LONG TERM GOAL #2   Title Patient will be able to verbalize completion of daily skin checks to reduce the risk of further wound development.    Time 6    Period Weeks    Status On-going                 Plan - 12/06/20 1234    Clinical Impression Statement see above    Personal Factors and Comorbidities Age;Comorbidity 3+;Past/Current Experience;Fitness;Time since onset of injury/illness/exacerbation    Comorbidities DM, CKD, neuorpathy, Hx CVA    Examination-Activity Limitations Locomotion Level;Transfers;Squat;Stand;Dressing    Examination-Participation Restrictions Community Activity    Stability/Clinical Decision Making Stable/Uncomplicated     Rehab Potential Good    PT Frequency 2x / week    PT Duration 6 weeks    PT Treatment/Interventions Gait training;Stair training;Functional mobility training;DME Instruction;Therapeutic activities;Therapeutic exercise;Balance training;Neuromuscular re-education;Patient/family education;Compression bandaging;Manual lymph drainage;Manual  techniques    PT Next Visit Plan continue to debride PRN and change dressings to promote wound healing    Consulted and Agree with Plan of Care Patient;Family member/caregiver    Family Member Consulted wife           Patient will benefit from skilled therapeutic intervention in order to improve the following deficits and impairments:  Abnormal gait,Decreased skin integrity,Decreased mobility,Impaired sensation  Visit Diagnosis: Other abnormalities of gait and mobility  Ulcer of right pretibial region, limited to breakdown of skin [facility])     Problem List Patient Active Problem List   Diagnosis Date Noted  . Advanced nonexudative age-related macular degeneration of right eye without subfoveal involvement 11/15/2020  . Advanced nonexudative age-related macular degeneration of left eye without subfoveal involvement 11/15/2020  . Diabetes mellitus without complication ([place]) 78/58/8502  . Posterior vitreous detachment of both eyes 11/15/2020  . Gait disturbance, post-stroke 11/02/2017  . Aphasia as late effect of cerebrovascular accident 11/02/2017  . 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 Aphasia   . PAF (paroxysmal atrial fibrillation) ([place])   . Diabetes mellitus type 2 in obese ([place])   . Benign essential HTN   . Hypothyroidism   . Hyperlipidemia   . Stroke (cerebrum) ([place]) 10/02/2017  . Afib ([place]) 01/10/2017  . SOB (shortness of breath) 01/10/2017  . Acute diastolic CHF (congestive heart failure) ([place]) 01/10/2017  . Atrial fibrillation with RVR ([place]) 01/10/2017  . Claudication ([place]) 07/25/2016  . Paroxysmal atrial  fibrillation ([place]) 07/25/2016  . Odynophagia 04/10/2014  . Allergic rhinitis 03/23/2014  . Chronic rhinitis 12/22/2013  . 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 Constipation 08/22/2011  . Obesity 09/20/2009  . G E R D 11/29/2007  . Celiac disease 11/29/2007   Rayetta Humphrey, PT CLT [phone] 12/06/2020, 12:37 PM  [place] [address] [place], [place], [address] Phone: [phone]   Fax:  [phone]  Name: [name] MRN: [id number] Date of Birth: Jan 30, 1929

## 2020-12-07 DIAGNOSIS — R600 Localized edema: Secondary | ICD-10-CM | POA: Diagnosis not present

## 2020-12-07 DIAGNOSIS — N189 Chronic kidney disease, unspecified: Secondary | ICD-10-CM | POA: Diagnosis not present

## 2020-12-07 DIAGNOSIS — E211 Secondary hyperparathyroidism, not elsewhere classified: Secondary | ICD-10-CM | POA: Diagnosis not present

## 2020-12-07 DIAGNOSIS — D638 Anemia in other chronic diseases classified elsewhere: Secondary | ICD-10-CM | POA: Diagnosis not present

## 2020-12-07 DIAGNOSIS — E1122 Type 2 diabetes mellitus with diabetic chronic kidney disease: Secondary | ICD-10-CM | POA: Diagnosis not present

## 2020-12-07 DIAGNOSIS — I10 Essential (primary) hypertension: Secondary | ICD-10-CM | POA: Diagnosis not present

## 2020-12-10 DIAGNOSIS — E1140 Type 2 diabetes mellitus with diabetic neuropathy, unspecified: Secondary | ICD-10-CM | POA: Diagnosis not present

## 2020-12-10 DIAGNOSIS — I5032 Chronic diastolic (congestive) heart failure: Secondary | ICD-10-CM | POA: Diagnosis not present

## 2020-12-10 DIAGNOSIS — I129 Hypertensive chronic kidney disease with stage 1 through stage 4 chronic kidney disease, or unspecified chronic kidney disease: Secondary | ICD-10-CM | POA: Diagnosis not present

## 2020-12-10 DIAGNOSIS — R600 Localized edema: Secondary | ICD-10-CM | POA: Diagnosis not present

## 2020-12-10 DIAGNOSIS — I87311 Chronic venous hypertension (idiopathic) with ulcer of right lower extremity: Secondary | ICD-10-CM | POA: Diagnosis not present

## 2020-12-11 ENCOUNTER — Other Ambulatory Visit: Payer: Self-pay

## 2020-12-11 ENCOUNTER — Encounter (HOSPITAL_COMMUNITY): Payer: Self-pay | Admitting: Physical Therapy

## 2020-12-11 ENCOUNTER — Ambulatory Visit (HOSPITAL_COMMUNITY): Payer: PPO | Attending: Internal Medicine | Admitting: Physical Therapy

## 2020-12-11 DIAGNOSIS — E1142 Type 2 diabetes mellitus with diabetic polyneuropathy: Secondary | ICD-10-CM | POA: Diagnosis not present

## 2020-12-11 DIAGNOSIS — R2689 Other abnormalities of gait and mobility: Secondary | ICD-10-CM | POA: Insufficient documentation

## 2020-12-11 DIAGNOSIS — L97811 Non-pressure chronic ulcer of other part of right lower leg limited to breakdown of skin: Secondary | ICD-10-CM | POA: Diagnosis not present

## 2020-12-11 DIAGNOSIS — B351 Tinea unguium: Secondary | ICD-10-CM | POA: Diagnosis not present

## 2020-12-11 NOTE — Therapy (Signed)
[name], [place], [address] Phone: [phone]   Fax:  [phone]  Wound Care Therapy  Patient Details  Name: [name] MRN: [id number] Date of Birth: [birth year] Referring Provider (PT): [name]   Encounter Date: 12/11/2020   PT End of Session - 12/11/20 1222    Visit Number 3    Number of Visits 12    Date for PT Re-Evaluation 01/15/21    Authorization Type [employer] (no auth req, no visit limit)    Progress Note Due on Visit 10    PT Start Time 1040    PT Stop Time 1128    PT Time Calculation (min) 48 min    Activity Tolerance Patient tolerated treatment well    Behavior During Therapy [facility] for tasks assessed/performed           Past Medical History:  Diagnosis Date  . Allergic rhinitis   . Asthmatic bronchitis   . Atrial fibrillation ([place])   . Celiac disease   . Cellulitis   . CKD (chronic kidney disease)   . Diabetes ([place])   . Diverticulosis   . Fx. left wrist 1987  . Gastric polyps   . GERD (gastroesophageal reflux disease)   . History of pneumonia   . Hypertension   . Hypothyroidism   . Iron deficiency anemia   . Melanoma ([place])   . Neuropathy   . PAF (paroxysmal atrial fibrillation) ([place])   . Prostate cancer ([place])   . REM sleep behavior disorder   . RLS (restless legs syndrome)   . Sleep apnea   . Spasticity   . Stroke ([place]) 09/2017  . Tremor   . Type 2 diabetes mellitus ([place])     Past Surgical History:  Procedure Laterality Date  . APPENDECTOMY  1978  . CARPAL TUNNEL RELEASE Left   . CATARACT EXTRACTION  03/2011   bilateral   . CHOLECYSTECTOMY  2001  . COLONOSCOPY W/ BIOPSIES  04/27/2008   diverticulosis, duodenitis  . FOOT SURGERY    . HERNIA REPAIR  1994   bilateral  . KNEE SURGERY Bilateral    x 2  . LUNG SURGERY  2001   for infection  . PROSTATECTOMY  2002  . UPPER GASTROINTESTINAL ENDOSCOPY  04/27/2008   celiac disease, gastric polyps    There  were no vitals filed for this visit.               Wound Therapy - 12/11/20 0001    Subjective PT states that his dressing slid down on the right leg.    Patient and Family Stated Goals wound to heal    Date of Onset 09/06/20    Prior Treatments MD and self care    Evaluation and Treatment Procedures Explained to Patient/Family Yes    Evaluation and Treatment Procedures agreed to;Other (comment)    Wound Properties Date First Assessed: 12/04/20 Time First Assessed: 1545 Wound Type: Venous stasis ulcer Location: Leg Location Orientation: Right Present on Admission: Yes   Dressing Type Compression wrap   medihoney on xeroform   Dressing Changed Changed    Dressing Status Old drainage    Dressing Change Frequency PRN    Site / Wound Assessment Yellow;Pink    % Wound base Red or Granulating 15%    % Wound base Yellow/Fibrinous Exudate 85%    Peri-wound Assessment Edema;Erythema (blanchable)    Drainage Amount None    Treatment Cleansed;Debridement (Selective)  Wound Properties Date First Assessed: 12/06/20 Time First Assessed: 1140 Location: Pretibial Location Orientation: Left;Proximal Wound Description (Comments): superfical wound but LE is edematous and red Present on Admission: Yes Final Assessment Date: 12/11/20 Final Assessment Time: 1045   Selective Debridement - Location entire wound bed of Rt LE    Selective Debridement - Tools Used Forceps;Scalpel    Selective Debridement - Tissue Removed slough    Wound Therapy - Clinical Statement Patient LLE wound is healed upon removal of compression wraps. R compression wrap slid down leg some since last session due to change in limb size with decreased LE edema. Cleaned and debrided RLE wound with focus on removing adherent slough in wound bed which patient tolerates well. Patient with several granulation buds in wound bed following debridement. Continued with medihoney to xeroform, Vaseline surrounding wound, 4x4, added foam to  prevent profore slipping. Patient will continue to benefit from skilled physical therapy in order to promote wound healing.    Wound Therapy - Functional Problem List gait and balance    Factors Delaying/Impairing Wound Healing Diabetes Mellitus;Vascular compromise;Altered sensation    Wound Therapy - Frequency 2X / week    Wound Therapy - Current Recommendations --   pumb and juxtafit   Wound Plan continue to debride, change dressings, and reduce LE edema to promote wound healing                   PT Education - 12/11/20 1221    Education Details obtaining pump and juxtafit    Person(s) Educated Spouse;Patient    Methods Explanation    Comprehension Verbalized understanding            PT Short Term Goals - 12/06/20 1235      PT SHORT TERM GOAL #1   Title Patient will have at least 50% granualtion tissue.    Time 3    Period Weeks    Status On-going    Target Date 12/25/20             PT Long Term Goals - 12/06/20 1235      PT LONG TERM GOAL #1   Title Patient's wound will be healed to reduce the risk of infection.    Time 6    Period Weeks    Status On-going      PT LONG TERM GOAL #2   Title Patient will be able to verbalize completion of daily skin checks to reduce the risk of further wound development.    Time 6    Period Weeks    Status On-going                 Plan - 12/11/20 1223    Clinical Impression Statement see above    Personal Factors and Comorbidities Age;Comorbidity 3+;Past/Current Experience;Fitness;Time since onset of injury/illness/exacerbation    Comorbidities DM, CKD, neuorpathy, Hx CVA    Examination-Activity Limitations Locomotion Level;Transfers;Squat;Stand;Dressing    Examination-Participation Restrictions Community Activity    Stability/Clinical Decision Making Stable/Uncomplicated    Rehab Potential Good    PT Frequency 2x / week    PT Duration 6 weeks    PT Treatment/Interventions Gait training;Stair  training;Functional mobility training;DME Instruction;Therapeutic activities;Therapeutic exercise;Balance training;Neuromuscular re-education;Patient/family education;Compression bandaging;Manual lymph drainage;Manual techniques    PT Next Visit Plan continue to debride PRN and change dressings to promote wound healing    Consulted and Agree with Plan of Care Patient;Family member/caregiver    Family Member Consulted wife  Patient will benefit from skilled therapeutic intervention in order to improve the following deficits and impairments:  Abnormal gait,Decreased skin integrity,Decreased mobility,Impaired sensation  Visit Diagnosis: Other abnormalities of gait and mobility  Ulcer of right pretibial region, limited to breakdown of skin [facility])     Problem List Patient Active Problem List   Diagnosis Date Noted  . Advanced nonexudative age-related macular degeneration of right eye without subfoveal involvement 11/15/2020  . Advanced nonexudative age-related macular degeneration of left eye without subfoveal involvement 11/15/2020  . Diabetes mellitus without complication ([place]) 29/11/1113  . Posterior vitreous detachment of both eyes 11/15/2020  . Gait disturbance, post-stroke 11/02/2017  . Aphasia as late effect of cerebrovascular accident 11/02/2017  . 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 Aphasia   . PAF (paroxysmal atrial fibrillation) ([place])   . Diabetes mellitus type 2 in obese ([place])   . Benign essential HTN   . Hypothyroidism   . Hyperlipidemia   . Stroke (cerebrum) ([place]) 10/02/2017  . Afib ([place]) 01/10/2017  . SOB (shortness of breath) 01/10/2017  . Acute diastolic CHF (congestive heart failure) ([place]) 01/10/2017  . Atrial fibrillation with RVR ([place]) 01/10/2017  . Claudication ([place]) 07/25/2016  . Paroxysmal atrial fibrillation ([place]) 07/25/2016  . Odynophagia 04/10/2014  . Allergic rhinitis 03/23/2014  . Chronic rhinitis 12/22/2013  . Intrinsic  asthma 07/15/2013  . Constipation 08/22/2011  . Obesity 09/20/2009  . G E R D 11/29/2007  . Celiac disease 11/29/2007    1:19 PM, 12/11/20 Mearl Latin PT, DPT Physical Therapist at [place] [employer], [place], [address] Phone: [phone]   Fax:  [phone]  Name: BARRON VANLOAN MRN: [id number] Date of Birth: 07-Sep-1929

## 2020-12-13 ENCOUNTER — Encounter (HOSPITAL_COMMUNITY): Payer: Self-pay

## 2020-12-13 ENCOUNTER — Other Ambulatory Visit: Payer: Self-pay

## 2020-12-13 ENCOUNTER — Ambulatory Visit (HOSPITAL_COMMUNITY): Payer: PPO

## 2020-12-13 DIAGNOSIS — R2689 Other abnormalities of gait and mobility: Secondary | ICD-10-CM

## 2020-12-13 DIAGNOSIS — L97811 Non-pressure chronic ulcer of other part of right lower leg limited to breakdown of skin: Secondary | ICD-10-CM

## 2020-12-13 NOTE — Therapy (Signed)
[name], [place], [address] Phone: [phone]   Fax:  [phone]  Wound Care Therapy  Patient Details  Name: [name] MRN: [id number] Date of Birth: 02-04-1929 Referring Provider (PT): [name]   Encounter Date: 12/13/2020   PT End of Session - 12/13/20 1652    Visit Number 4    Number of Visits 12    Date for PT Re-Evaluation 01/15/21    Authorization Type [employer] (no auth req, no visit limit)    Progress Note Due on Visit 10    PT Start Time 1408    PT Stop Time 1446    PT Time Calculation (min) 38 min    Activity Tolerance Patient tolerated treatment well    Behavior During Therapy [facility] for tasks assessed/performed           Past Medical History:  Diagnosis Date  . Allergic rhinitis   . Asthmatic bronchitis   . Atrial fibrillation ([place])   . Celiac disease   . Cellulitis   . CKD (chronic kidney disease)   . Diabetes ([name])   . Diverticulosis   . Fx. left wrist 1987  . Gastric polyps   . GERD (gastroesophageal reflux disease)   . History of pneumonia   . Hypertension   . Hypothyroidism   . Iron deficiency anemia   . Melanoma ([name])   . Neuropathy   . PAF (paroxysmal atrial fibrillation) ([name])   . Prostate cancer ([name])   . REM sleep behavior disorder   . RLS (restless legs syndrome)   . Sleep apnea   . Spasticity   . Stroke ([name]) 09/2017  . Tremor   . Type 2 diabetes mellitus ([name])     Past Surgical History:  Procedure Laterality Date  . APPENDECTOMY  1978  . CARPAL TUNNEL RELEASE Left   . CATARACT EXTRACTION  03/2011   bilateral   . CHOLECYSTECTOMY  2001  . COLONOSCOPY W/ BIOPSIES  04/27/2008   diverticulosis, duodenitis  . FOOT SURGERY    . HERNIA REPAIR  1994   bilateral  . KNEE SURGERY Bilateral    x 2  . LUNG SURGERY  2001   for infection  . PROSTATECTOMY  2002  . UPPER GASTROINTESTINAL ENDOSCOPY  04/27/2008   celiac disease, gastric polyps    There  were no vitals filed for this visit.    Subjective Assessment - 12/13/20 1647    Subjective No reports of pain.  Has called and waiting for price on Juxtafit from [name].    Currently in Pain? No/denies                     Wound Therapy - 12/13/20 0001    Subjective No reports of pain.  Has called and waiting for price on Juxtafit from [name].    Patient and Family Stated Goals wound to heal    Date of Onset 09/06/20    Prior Treatments MD and self care    Evaluation and Treatment Procedures Explained to Patient/Family Yes    Evaluation and Treatment Procedures agreed to;Other (comment)    Wound Properties Date First Assessed: 12/04/20 Time First Assessed: 1545 Wound Type: Venous stasis ulcer Location: Leg Location Orientation: Right Present on Admission: Yes   Wound Image View All Images View Images    Dressing Type --   medihoney,2x2, xeroform, profore lite with foam   Dressing Changed Changed    Dressing Status  Old drainage    Dressing Change Frequency PRN    Site / Wound Assessment Yellow;Pink    % Wound base Red or Granulating 5%    % Wound base Yellow/Fibrinous Exudate 95%    Peri-wound Assessment Edema;Erythema (blanchable)    Wound Length (cm) 3.2 cm    Wound Width (cm) 2.8 cm    Wound Depth (cm) 0.3 cm    Wound Volume (cm^3) 2.69 cm^3    Wound Surface Area (cm^2) 8.96 cm^2    Margins Unattached edges (unapproximated)    Drainage Amount Scant    Drainage Description Serous    Treatment Cleansed;Debridement (Selective)    Selective Debridement - Location entire wound bed of Rt LE    Selective Debridement - Tools Used Forceps;Scalpel    Selective Debridement - Tissue Removed slough    Wound Therapy - Clinical Statement Adherent slough entire wound bed, selective debridment complete wiht forceps/scalpel with cross hatching to assist with debridment of slough.  Continued with medihoney and xeroform, profore lite with foam.  Reduced amount of cotton of foot as  rolled up in shoe.  Reports of comfort at EOS.    Wound Therapy - Functional Problem List gait and balance    Factors Delaying/Impairing Wound Healing Diabetes Mellitus;Vascular compromise;Altered sensation    Wound Therapy - Frequency 2X / week    Wound Therapy - Current Recommendations --   pump and juxtafit   Wound Plan continue to debride, change dressings, and reduce LE edema to promote wound healing                     PT Short Term Goals - 12/06/20 1235      PT SHORT TERM GOAL #1   Title Patient will have at least 50% granualtion tissue.    Time 3    Period Weeks    Status On-going    Target Date 12/25/20             PT Long Term Goals - 12/06/20 1235      PT LONG TERM GOAL #1   Title Patient's wound will be healed to reduce the risk of infection.    Time 6    Period Weeks    Status On-going      PT LONG TERM GOAL #2   Title Patient will be able to verbalize completion of daily skin checks to reduce the risk of further wound development.    Time 6    Period Weeks    Status On-going                  Patient will benefit from skilled therapeutic intervention in order to improve the following deficits and impairments:     Visit Diagnosis: Ulcer of right pretibial region, limited to breakdown of skin (HCC)  Other abnormalities of gait and mobility     Problem List Patient Active Problem List   Diagnosis Date Noted  . Advanced nonexudative age-related macular degeneration of right eye without subfoveal involvement 11/15/2020  . Advanced nonexudative age-related macular degeneration of left eye without subfoveal involvement 11/15/2020  . Diabetes mellitus without complication ([place]) 19/37/9024  . Posterior vitreous detachment of both eyes 11/15/2020  . Gait disturbance, post-stroke 11/02/2017  . Aphasia as late effect of cerebrovascular accident 11/02/2017  . 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 Aphasia   . PAF  (paroxysmal atrial fibrillation) ([name])   . Diabetes mellitus type 2 in obese ([name])   .  Benign essential HTN   . Hypothyroidism   . Hyperlipidemia   . Stroke (cerebrum) ([name]) 10/02/2017  . Afib ([name]) 01/10/2017  . SOB (shortness of breath) 01/10/2017  . Acute diastolic CHF (congestive heart failure) ([place]) 01/10/2017  . Atrial fibrillation with RVR ([place]) 01/10/2017  . Claudication ([place]) 07/25/2016  . Paroxysmal atrial fibrillation ([place]) 07/25/2016  . Odynophagia 04/10/2014  . Allergic rhinitis 03/23/2014  . Chronic rhinitis 12/22/2013  . 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 Constipation 08/22/2011  . Obesity 09/20/2009  . G E R D 11/29/2007  . Celiac disease 11/29/2007   Ihor Austin, LPTA/CLT; CBIS [phone]  Aldona Lento 12/13/2020, 4:53 PM  [place] [address] [place], [place], [address] Phone: [phone]   Fax:  [phone]  Name: RISHITH SIDDOWAY MRN: [id number] Date of Birth: 05/13/1929

## 2020-12-18 ENCOUNTER — Ambulatory Visit (HOSPITAL_COMMUNITY): Payer: PPO

## 2020-12-18 ENCOUNTER — Other Ambulatory Visit: Payer: Self-pay

## 2020-12-18 DIAGNOSIS — L97811 Non-pressure chronic ulcer of other part of right lower leg limited to breakdown of skin: Secondary | ICD-10-CM

## 2020-12-18 DIAGNOSIS — R2689 Other abnormalities of gait and mobility: Secondary | ICD-10-CM | POA: Diagnosis not present

## 2020-12-18 NOTE — Therapy (Signed)
[place], [place], [address] Phone: [phone]   Fax:  [phone]  Wound Care Therapy  Patient Details  Name: [name] MRN: [id number] Date of Birth: [birth year] Referring Provider (PT): [name]   Encounter Date: 12/18/2020   PT End of Session - 12/18/20 1658    Visit Number 5    Number of Visits 12    Date for PT Re-Evaluation 01/15/21    Authorization Type [employer] (no auth req, no visit limit)    Progress Note Due on Visit 10    PT Start Time 1137    PT Stop Time 1220    PT Time Calculation (min) 43 min    Activity Tolerance Patient tolerated treatment well    Behavior During Therapy [facility] for tasks assessed/performed           Past Medical History:  Diagnosis Date  . Allergic rhinitis   . Asthmatic bronchitis   . Atrial fibrillation ([place])   . Celiac disease   . Cellulitis   . CKD (chronic kidney disease)   . Diabetes ([place])   . Diverticulosis   . Fx. left wrist 1987  . Gastric polyps   . GERD (gastroesophageal reflux disease)   . History of pneumonia   . Hypertension   . Hypothyroidism   . Iron deficiency anemia   . Melanoma ([place])   . Neuropathy   . PAF (paroxysmal atrial fibrillation) ([place])   . Prostate cancer ([place])   . REM sleep behavior disorder   . RLS (restless legs syndrome)   . Sleep apnea   . Spasticity   . Stroke ([place]) 09/2017  . Tremor   . Type 2 diabetes mellitus ([place])     Past Surgical History:  Procedure Laterality Date  . APPENDECTOMY  1978  . CARPAL TUNNEL RELEASE Left   . CATARACT EXTRACTION  03/2011   bilateral   . CHOLECYSTECTOMY  2001  . COLONOSCOPY W/ BIOPSIES  04/27/2008   diverticulosis, duodenitis  . FOOT SURGERY    . HERNIA REPAIR  1994   bilateral  . KNEE SURGERY Bilateral    x 2  . LUNG SURGERY  2001   for infection  . PROSTATECTOMY  2002  . UPPER GASTROINTESTINAL ENDOSCOPY  04/27/2008   celiac disease, gastric polyps    There  were no vitals filed for this visit.    Subjective Assessment - 12/18/20 1537    Subjective No reports of pain.  Pt arrived with dressings intact, stated end rolled up some.    Currently in Pain? No/denies                     Wound Therapy - 12/18/20 0001    Subjective No reports of pain.  Pt arrived with dressings intact, stated end rolled up some.    Patient and Family Stated Goals wound to heal    Date of Onset 09/06/20    Prior Treatments MD and self care    Evaluation and Treatment Procedures Explained to Patient/Family Yes    Evaluation and Treatment Procedures agreed to;Other (comment)    Wound Properties Date First Assessed: 12/04/20 Time First Assessed: 1545 Wound Type: Venous stasis ulcer Location: Leg Location Orientation: Right Present on Admission: Yes   Wound Image View All Images View Images    Dressing Type Compression wrap   medihoney 2x2 with xeroform, tubular netting #6, profore lite wiht additional cotton and foam  Dressing Changed Changed    Dressing Status Old drainage    Dressing Change Frequency PRN    Site / Wound Assessment Yellow;Pink    % Wound base Red or Granulating 10%    % Wound base Yellow/Fibrinous Exudate 90%    Peri-wound Assessment Edema;Erythema (blanchable)    Margins Unattached edges (unapproximated)    Drainage Amount Scant    Drainage Description Serous    Treatment Cleansed;Debridement (Selective)    Selective Debridement - Location entire wound bed of Rt LE    Selective Debridement - Tools Used Forceps;Scalpel    Selective Debridement - Tissue Removed slough    Wound Therapy - Clinical Statement Dressing slid up foot with bottle neck edema distal extremity.  Retrograde massage complete to address edema.  Continued cross hatching to address adherent slough wound bed.  Continued with medihoney to address adherent slough wiht vaseline perimeter and profore lite with foam to assist with sliding.  Encouraged pt to put sock on foot to  reduce sliding up foot while putting shoes on.    Wound Therapy - Functional Problem List gait and balance    Factors Delaying/Impairing Wound Healing Diabetes Mellitus;Vascular compromise;Altered sensation    Wound Therapy - Frequency 2X / week    Wound Therapy - Current Recommendations --   pump and juxtafit   Wound Plan F/U wiht pump and juxtafit purchase,  continue to debride, change dressings, and reduce LE edema to promote wound healing    Dressing  medihoney, xeroform, profore lite with 1/2 foam                     PT Short Term Goals - 12/06/20 1235      PT SHORT TERM GOAL #1   Title Patient will have at least 50% granualtion tissue.    Time 3    Period Weeks    Status On-going    Target Date 12/25/20             PT Long Term Goals - 12/06/20 1235      PT LONG TERM GOAL #1   Title Patient's wound will be healed to reduce the risk of infection.    Time 6    Period Weeks    Status On-going      PT LONG TERM GOAL #2   Title Patient will be able to verbalize completion of daily skin checks to reduce the risk of further wound development.    Time 6    Period Weeks    Status On-going                  Patient will benefit from skilled therapeutic intervention in order to improve the following deficits and impairments:     Visit Diagnosis: Other abnormalities of gait and mobility  Ulcer of right pretibial region, limited to breakdown of skin [facility])     Problem List Patient Active Problem List   Diagnosis Date Noted  . Advanced nonexudative age-related macular degeneration of right eye without subfoveal involvement 11/15/2020  . Advanced nonexudative age-related macular degeneration of left eye without subfoveal involvement 11/15/2020  . Diabetes mellitus without complication ([place]) 52/77/8242  . Posterior vitreous detachment of both eyes 11/15/2020  . Gait disturbance, post-stroke 11/02/2017  . Aphasia as late effect of cerebrovascular  accident 11/02/2017  . 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 Aphasia   . PAF (paroxysmal atrial fibrillation) ([place])   . Diabetes mellitus type 2 in obese (  HCC)   . Benign essential HTN   . Hypothyroidism   . Hyperlipidemia   . Stroke (cerebrum) ([place]) 10/02/2017  . Afib ([place]) 01/10/2017  . SOB (shortness of breath) 01/10/2017  . Acute diastolic CHF (congestive heart failure) ([place]) 01/10/2017  . Atrial fibrillation with RVR ([place]) 01/10/2017  . Claudication ([place]) 07/25/2016  . Paroxysmal atrial fibrillation ([place]) 07/25/2016  . Odynophagia 04/10/2014  . Allergic rhinitis 03/23/2014  . Chronic rhinitis 12/22/2013  . 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 Constipation 08/22/2011  . Obesity 09/20/2009  . G E R D 11/29/2007  . Celiac disease 11/29/2007   Ihor Austin, LPTA/CLT; CBIS [phone]  Aldona Lento 12/18/2020, 4:59 PM  [place], [place], [address] Phone: [phone]   Fax:  [phone]  Name: [name] MRN: [id number] Date of Birth: 1929-09-11

## 2020-12-20 ENCOUNTER — Encounter (HOSPITAL_COMMUNITY): Payer: Self-pay | Admitting: Physical Therapy

## 2020-12-20 ENCOUNTER — Other Ambulatory Visit: Payer: Self-pay

## 2020-12-20 ENCOUNTER — Ambulatory Visit (HOSPITAL_COMMUNITY): Payer: PPO | Admitting: Physical Therapy

## 2020-12-20 DIAGNOSIS — L97811 Non-pressure chronic ulcer of other part of right lower leg limited to breakdown of skin: Secondary | ICD-10-CM

## 2020-12-20 DIAGNOSIS — R2689 Other abnormalities of gait and mobility: Secondary | ICD-10-CM | POA: Diagnosis not present

## 2020-12-20 NOTE — Therapy (Signed)
[place], [place], [address] Phone: [phone]   Fax:  [phone]  Wound Care Therapy  Patient Details  Name: [name] MRN: [id number] Date of Birth: 1929-07-02 Referring Provider (PT): [name]   Encounter Date: 12/20/2020   PT End of Session - 12/20/20 1141    Visit Number 6    Number of Visits 12    Date for PT Re-Evaluation 01/15/21    Authorization Type [employer] (no auth req, no visit limit)    Progress Note Due on Visit 10    PT Start Time 1001    PT Stop Time 1040    PT Time Calculation (min) 39 min    Activity Tolerance Patient tolerated treatment well    Behavior During Therapy [facility] for tasks assessed/performed           Past Medical History:  Diagnosis Date  . Allergic rhinitis   . Asthmatic bronchitis   . Atrial fibrillation ([place])   . Celiac disease   . Cellulitis   . CKD (chronic kidney disease)   . Diabetes ([place])   . Diverticulosis   . Fx. left wrist 1987  . Gastric polyps   . GERD (gastroesophageal reflux disease)   . History of pneumonia   . Hypertension   . Hypothyroidism   . Iron deficiency anemia   . Melanoma ([place])   . Neuropathy   . PAF (paroxysmal atrial fibrillation) ([place])   . Prostate cancer ([place])   . REM sleep behavior disorder   . RLS (restless legs syndrome)   . Sleep apnea   . Spasticity   . Stroke ([place]) 09/2017  . Tremor   . Type 2 diabetes mellitus ([place])     Past Surgical History:  Procedure Laterality Date  . APPENDECTOMY  1978  . CARPAL TUNNEL RELEASE Left   . CATARACT EXTRACTION  03/2011   bilateral   . CHOLECYSTECTOMY  2001  . COLONOSCOPY W/ BIOPSIES  04/27/2008   diverticulosis, duodenitis  . FOOT SURGERY    . HERNIA REPAIR  1994   bilateral  . KNEE SURGERY Bilateral    x 2  . LUNG SURGERY  2001   for infection  . PROSTATECTOMY  2002  . UPPER GASTROINTESTINAL ENDOSCOPY  04/27/2008   celiac disease, gastric polyps     There were no vitals filed for this visit.      [facility] PT Assessment - 12/20/20 0001      Assessment   Medical Diagnosis Chronic venous stasis wound    Referring Provider (PT) [name]    Onset Date/Surgical Date 09/06/20    Prior Therapy self care, MD care                   Wound Therapy - 12/20/20 0001    Subjective Reports no pain and dressing stayed in place better this time    Patient and Family Stated Goals wound to heal    Date of Onset 09/06/20    Prior Treatments MD and self care    Evaluation and Treatment Procedures Explained to Patient/Family Yes    Evaluation and Treatment Procedures agreed to;Other (comment)    Wound Properties Date First Assessed: 12/04/20 Time First Assessed: 1545 Wound Type: Venous stasis ulcer Location: Leg Location Orientation: Right Present on Admission: Yes   Dressing Type Foam - Lift dressing to assess site every shift;Compression wrap    Dressing Changed Changed    Dressing  Status Old drainage    Dressing Change Frequency PRN    Site / Wound Assessment Yellow;Pale;Pink    % Wound base Red or Granulating 5%    % Wound base Yellow/Fibrinous Exudate 95%    Peri-wound Assessment Edema;Erythema (blanchable)    Wound Length (cm) 3.1 cm   was 3.2   Wound Width (cm) 2.9 cm   was 2.8   Wound Depth (cm) 0.3 cm   was .3   Wound Volume (cm^3) 2.7 cm^3    Wound Surface Area (cm^2) 8.99 cm^2    Margins Unattached edges (unapproximated)    Drainage Amount Scant    Drainage Description Serosanguineous    Treatment Cleansed;Debridement (Selective)    Selective Debridement - Location entire wound bed of Rt LE    Selective Debridement - Tools Used Forceps;Scalpel    Selective Debridement - Tissue Removed slough    Wound Therapy - Clinical Statement Wound with continued slough in wound but able to debride and crosshatch across adherent slough. Pain limiting debridement. Continued with medihoney, foam and profore lite after applying  vaseoline to entire leg. Measurements about the same as last time measured but this is to be expected secondary to continued slough in wound.    Wound Therapy - Functional Problem List gait and balance    Factors Delaying/Impairing Wound Healing Diabetes Mellitus;Vascular compromise;Altered sensation    Wound Therapy - Frequency 2X / week    Wound Therapy - Current Recommendations --   pump and juxtafit   Wound Plan continue to debride, change dressings, and reduce LE edema to promote wound healing    Dressing  medihoney, tubular netting #6, profore lite with 1/2 foam                   PT Education - 12/20/20 1142    Education Details on not usign pump on right leg secondary to compression dressing in place.    Person(s) Educated Patient    Methods Explanation    Comprehension Verbalized understanding            PT Short Term Goals - 12/06/20 1235      PT SHORT TERM GOAL #1   Title Patient will have at least 50% granualtion tissue.    Time 3    Period Weeks    Status On-going    Target Date 12/25/20             PT Long Term Goals - 12/06/20 1235      PT LONG TERM GOAL #1   Title Patient's wound will be healed to reduce the risk of infection.    Time 6    Period Weeks    Status On-going      PT LONG TERM GOAL #2   Title Patient will be able to verbalize completion of daily skin checks to reduce the risk of further wound development.    Time 6    Period Weeks    Status On-going                 Plan - 12/20/20 1142    Clinical Impression Statement see above    Personal Factors and Comorbidities Age;Comorbidity 3+;Past/Current Experience;Fitness;Time since onset of injury/illness/exacerbation    Comorbidities DM, CKD, neuorpathy, Hx CVA    Examination-Activity Limitations Locomotion Level;Transfers;Squat;Stand;Dressing    Examination-Participation Restrictions Community Activity    Stability/Clinical Decision Making Stable/Uncomplicated    Rehab  Potential Good    PT Frequency 2x / week  PT Duration 6 weeks    PT Treatment/Interventions Gait training;Stair training;Functional mobility training;DME Instruction;Therapeutic activities;Therapeutic exercise;Balance training;Neuromuscular re-education;Patient/family education;Compression bandaging;Manual lymph drainage;Manual techniques    PT Next Visit Plan continue to debride PRN and change dressings to promote wound healing    Consulted and Agree with Plan of Care Patient;Family member/caregiver    Family Member Consulted wife           Patient will benefit from skilled therapeutic intervention in order to improve the following deficits and impairments:  Abnormal gait,Decreased skin integrity,Decreased mobility,Impaired sensation  Visit Diagnosis: Other abnormalities of gait and mobility  Ulcer of right pretibial region, limited to breakdown of skin [facility])     Problem List Patient Active Problem List   Diagnosis Date Noted  . Advanced nonexudative age-related macular degeneration of right eye without subfoveal involvement 11/15/2020  . Advanced nonexudative age-related macular degeneration of left eye without subfoveal involvement 11/15/2020  . Diabetes mellitus without complication ([place]) 25/36/6440  . Posterior vitreous detachment of both eyes 11/15/2020  . Gait disturbance, post-stroke 11/02/2017  . Aphasia as late effect of cerebrovascular accident 11/02/2017  . 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 Aphasia   . PAF (paroxysmal atrial fibrillation) ([place])   . Diabetes mellitus type 2 in obese ([place])   . Benign essential HTN   . Hypothyroidism   . Hyperlipidemia   . Stroke (cerebrum) ([place]) 10/02/2017  . Afib ([place]) 01/10/2017  . SOB (shortness of breath) 01/10/2017  . Acute diastolic CHF (congestive heart failure) ([place]) 01/10/2017  . Atrial fibrillation with RVR ([place]) 01/10/2017  . Claudication ([place]) 07/25/2016  . Paroxysmal atrial fibrillation ([place])  07/25/2016  . Odynophagia 04/10/2014  . Allergic rhinitis 03/23/2014  . Chronic rhinitis 12/22/2013  . 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 Constipation 08/22/2011  . Obesity 09/20/2009  . G E R D 11/29/2007  . Celiac disease 11/29/2007    11:54 AM, 12/20/20 Jerene Pitch, DPT Physical Therapy with [facility]  [phone] office  [place] [address] [place], [place], [address] Phone: [phone]   Fax:  [phone]  Name: OTHAL KUBITZ MRN: [id number] Date of Birth: 10-Aug-1929

## 2020-12-25 ENCOUNTER — Encounter (HOSPITAL_COMMUNITY): Payer: Self-pay | Admitting: Physical Therapy

## 2020-12-25 ENCOUNTER — Ambulatory Visit (HOSPITAL_COMMUNITY): Payer: PPO | Admitting: Physical Therapy

## 2020-12-25 ENCOUNTER — Other Ambulatory Visit: Payer: Self-pay

## 2020-12-25 DIAGNOSIS — R2689 Other abnormalities of gait and mobility: Secondary | ICD-10-CM | POA: Diagnosis not present

## 2020-12-25 DIAGNOSIS — L97811 Non-pressure chronic ulcer of other part of right lower leg limited to breakdown of skin: Secondary | ICD-10-CM

## 2020-12-25 NOTE — Therapy (Signed)
[place], [place], [address] Phone: [phone]   Fax:  [phone]  Wound Care Therapy  Patient Details  Name: [name] MRN: [id number] Date of Birth: 08-17-1929 Referring Provider (PT): [name]   Encounter Date: 12/25/2020   PT End of Session - 12/25/20 1304    Visit Number 7    Number of Visits 12    Date for PT Re-Evaluation 01/15/21    Authorization Type [employer] (no auth req, no visit limit)    Progress Note Due on Visit 10    PT Start Time 1130    PT Stop Time 1210    PT Time Calculation (min) 40 min    Activity Tolerance Patient tolerated treatment well    Behavior During Therapy [facility] for tasks assessed/performed           Past Medical History:  Diagnosis Date  . Allergic rhinitis   . Asthmatic bronchitis   . Atrial fibrillation ([place])   . Celiac disease   . Cellulitis   . CKD (chronic kidney disease)   . Diabetes ([place])   . Diverticulosis   . Fx. left wrist 1987  . Gastric polyps   . GERD (gastroesophageal reflux disease)   . History of pneumonia   . Hypertension   . Hypothyroidism   . Iron deficiency anemia   . Melanoma ([place])   . Neuropathy   . PAF (paroxysmal atrial fibrillation) ([place])   . Prostate cancer ([place])   . REM sleep behavior disorder   . RLS (restless legs syndrome)   . Sleep apnea   . Spasticity   . Stroke ([place]) 09/2017  . Tremor   . Type 2 diabetes mellitus ([place])     Past Surgical History:  Procedure Laterality Date  . APPENDECTOMY  1978  . CARPAL TUNNEL RELEASE Left   . CATARACT EXTRACTION  03/2011   bilateral   . CHOLECYSTECTOMY  2001  . COLONOSCOPY W/ BIOPSIES  04/27/2008   diverticulosis, duodenitis  . FOOT SURGERY    . HERNIA REPAIR  1994   bilateral  . KNEE SURGERY Bilateral    x 2  . LUNG SURGERY  2001   for infection  . PROSTATECTOMY  2002  . UPPER GASTROINTESTINAL ENDOSCOPY  04/27/2008   celiac disease, gastric polyps     There were no vitals filed for this visit.      [facility] PT Assessment - 12/25/20 0001      Assessment   Medical Diagnosis Chronic venous stasis wound    Referring Provider (PT) [name]    Onset Date/Surgical Date 09/06/20                   Wound Therapy - 12/25/20 0001    Subjective Reports occassional discomfort in the wound bed but no current pain    Patient and Family Stated Goals wound to heal    Date of Onset 09/06/20    Prior Treatments MD and self care    Evaluation and Treatment Procedures Explained to Patient/Family Yes    Evaluation and Treatment Procedures agreed to;Other (comment)    Wound Properties Date First Assessed: 12/04/20 Time First Assessed: 1545 Wound Type: Venous stasis ulcer Location: Leg Location Orientation: Right Present on Admission: Yes   Wound Image View All Images View Images    Dressing Type Foam - Lift dressing to assess site every shift;Compression wrap   medihoney on gauze   Dressing Changed  Changed    Dressing Status Old drainage    Dressing Change Frequency PRN    Site / Wound Assessment Yellow;Pale;Pink    % Wound base Red or Granulating 10%    % Wound base Yellow/Fibrinous Exudate 90%    Peri-wound Assessment Edema;Erythema (blanchable);Purple    Margins Unattached edges (unapproximated)    Drainage Amount Scant    Drainage Description Serosanguineous    Treatment Cleansed;Debridement (Selective)    Selective Debridement - Location entire wound bed of Rt LE    Selective Debridement - Tools Used Forceps;Scalpel    Selective Debridement - Tissue Removed slough    Wound Therapy - Clinical Statement Patient's wound continues to reduce in slough, able to debride most yellow slough but continued fibrous adherent white tissue noted. Continued with crosshatching to adherent tissue. Inferior region of wound between 4 and 6  o'clock slight purple coloring in wound bed noted. Changed to medihoney hydrogel that was cut to fit wound bed to  continue to promote autolytic debridement. Continued with tubular netting and profore lite with foam.    Wound Therapy - Functional Problem List gait and balance    Factors Delaying/Impairing Wound Healing Diabetes Mellitus;Vascular compromise;Altered sensation    Wound Therapy - Frequency 2X / week    Wound Therapy - Current Recommendations --   pump and juxtafit   Wound Plan continue to debride, change dressings, and reduce LE edema to promote wound healing    Dressing  medihoney hydrogel, tubular netting #6, profore lite with 1/2 foam                     PT Short Term Goals - 12/06/20 1235      PT SHORT TERM GOAL #1   Title Patient will have at least 50% granualtion tissue.    Time 3    Period Weeks    Status On-going    Target Date 12/25/20             PT Long Term Goals - 12/06/20 1235      PT LONG TERM GOAL #1   Title Patient's wound will be healed to reduce the risk of infection.    Time 6    Period Weeks    Status On-going      PT LONG TERM GOAL #2   Title Patient will be able to verbalize completion of daily skin checks to reduce the risk of further wound development.    Time 6    Period Weeks    Status On-going                 Plan - 12/25/20 1304    Clinical Impression Statement see above    Personal Factors and Comorbidities Age;Comorbidity 3+;Past/Current Experience;Fitness;Time since onset of injury/illness/exacerbation    Comorbidities DM, CKD, neuorpathy, Hx CVA    Examination-Activity Limitations Locomotion Level;Transfers;Squat;Stand;Dressing    Examination-Participation Restrictions Community Activity    Stability/Clinical Decision Making Stable/Uncomplicated    Rehab Potential Good    PT Frequency 2x / week    PT Duration 6 weeks    PT Treatment/Interventions Gait training;Stair training;Functional mobility training;DME Instruction;Therapeutic activities;Therapeutic exercise;Balance training;Neuromuscular  re-education;Patient/family education;Compression bandaging;Manual lymph drainage;Manual techniques    PT Next Visit Plan continue to debride PRN and change dressings to promote wound healing    Consulted and Agree with Plan of Care Patient;Family member/caregiver    Family Member Consulted wife           Patient will benefit  from skilled therapeutic intervention in order to improve the following deficits and impairments:  Abnormal gait,Decreased skin integrity,Decreased mobility,Impaired sensation  Visit Diagnosis: Other abnormalities of gait and mobility  Ulcer of right pretibial region, limited to breakdown of skin [facility])     Problem List Patient Active Problem List   Diagnosis Date Noted  . Advanced nonexudative age-related macular degeneration of right eye without subfoveal involvement 11/15/2020  . Advanced nonexudative age-related macular degeneration of left eye without subfoveal involvement 11/15/2020  . Diabetes mellitus without complication ([place]) 15/94/5859  . Posterior vitreous detachment of both eyes 11/15/2020  . Gait disturbance, post-stroke 11/02/2017  . Aphasia as late effect of cerebrovascular accident 11/02/2017  . 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 Aphasia   . PAF (paroxysmal atrial fibrillation) ([place])   . Diabetes mellitus type 2 in obese ([place])   . Benign essential HTN   . Hypothyroidism   . Hyperlipidemia   . Stroke (cerebrum) ([place]) 10/02/2017  . Afib ([place]) 01/10/2017  . SOB (shortness of breath) 01/10/2017  . Acute diastolic CHF (congestive heart failure) ([place]) 01/10/2017  . Atrial fibrillation with RVR ([place]) 01/10/2017  . Claudication ([place]) 07/25/2016  . Paroxysmal atrial fibrillation ([place]) 07/25/2016  . Odynophagia 04/10/2014  . Allergic rhinitis 03/23/2014  . Chronic rhinitis 12/22/2013  . 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 Constipation 08/22/2011  . Obesity 09/20/2009  . G E R D 11/29/2007  . Celiac disease 11/29/2007   1:10  PM, 12/25/20 Jerene Pitch, DPT Physical Therapy with [facility]  [phone] office  [place] [address] [place], [place], [address] Phone: [phone]   Fax:  [phone]  Name: [name] MRN: [id number] Date of Birth: [birth year]

## 2020-12-27 ENCOUNTER — Telehealth (HOSPITAL_COMMUNITY): Payer: Self-pay | Admitting: Physical Therapy

## 2020-12-27 ENCOUNTER — Other Ambulatory Visit: Payer: Self-pay

## 2020-12-27 ENCOUNTER — Ambulatory Visit (HOSPITAL_COMMUNITY): Payer: PPO | Admitting: Physical Therapy

## 2020-12-27 DIAGNOSIS — L97811 Non-pressure chronic ulcer of other part of right lower leg limited to breakdown of skin: Secondary | ICD-10-CM

## 2020-12-27 DIAGNOSIS — R2689 Other abnormalities of gait and mobility: Secondary | ICD-10-CM

## 2020-12-27 NOTE — Telephone Encounter (Signed)
Called referring MD, Celene Squibb and left message about increased redness around the wound and rash/second spot on plantar aspect of the foot that looks concerning. Asked MD to call back with questions and if he thinks referral for wound culture or anitibiotics would be beneficial. Images of wound and plantar aspect of foot taken today.   4:20 PM, 12/27/20 Jerene Pitch, DPT Physical Therapy with [facility]  [phone] office

## 2020-12-27 NOTE — Therapy (Signed)
[place], [place], [address] Phone: [phone]   Fax:  [phone]  Wound Care Therapy  Patient Details  Name: [name] MRN: [id number] Date of Birth: March 24, 1929 Referring Provider (PT): [name]   Encounter Date: 12/27/2020   PT End of Session - 12/27/20 1330    Visit Number 8    Number of Visits 12    Date for PT Re-Evaluation 01/15/21    Authorization Type [employer] (no auth req, no visit limit)    Progress Note Due on Visit 10    PT Start Time 1138    PT Stop Time 1220    PT Time Calculation (min) 42 min    Activity Tolerance Patient tolerated treatment well    Behavior During Therapy [facility] for tasks assessed/performed           Past Medical History:  Diagnosis Date  . Allergic rhinitis   . Asthmatic bronchitis   . Atrial fibrillation ([place])   . Celiac disease   . Cellulitis   . CKD (chronic kidney disease)   . Diabetes ([place])   . Diverticulosis   . Fx. left wrist 1987  . Gastric polyps   . GERD (gastroesophageal reflux disease)   . History of pneumonia   . Hypertension   . Hypothyroidism   . Iron deficiency anemia   . Melanoma ([place])   . Neuropathy   . PAF (paroxysmal atrial fibrillation) ([place])   . Prostate cancer ([place])   . REM sleep behavior disorder   . RLS (restless legs syndrome)   . Sleep apnea   . Spasticity   . Stroke ([place]) 09/2017  . Tremor   . Type 2 diabetes mellitus ([place])     Past Surgical History:  Procedure Laterality Date  . APPENDECTOMY  1978  . CARPAL TUNNEL RELEASE Left   . CATARACT EXTRACTION  03/2011   bilateral   . CHOLECYSTECTOMY  2001  . COLONOSCOPY W/ BIOPSIES  04/27/2008   diverticulosis, duodenitis  . FOOT SURGERY    . HERNIA REPAIR  1994   bilateral  . KNEE SURGERY Bilateral    x 2  . LUNG SURGERY  2001   for infection  . PROSTATECTOMY  2002  . UPPER GASTROINTESTINAL ENDOSCOPY  04/27/2008   celiac disease, gastric polyps     There were no vitals filed for this visit.               Wound Therapy - 12/27/20 1317    Subjective pt states he is doing well today without any issues.    Patient and Family Stated Goals wound to heal    Date of Onset 09/06/20    Prior Treatments MD and self care    Evaluation and Treatment Procedures Explained to Patient/Family Yes    Evaluation and Treatment Procedures agreed to;Other (comment)    Wound Properties Date First Assessed: 12/04/20 Time First Assessed: 1545 Wound Type: Venous stasis ulcer Location: Leg Location Orientation: Right Present on Admission: Yes   Wound Image View All Images View Images    Dressing Type Compression wrap   with 1/4" foam and medihoney hydrogel sheet   Dressing Changed Changed    Dressing Status Old drainage    Dressing Change Frequency PRN    Site / Wound Assessment Pink;Red;Yellow    % Wound base Red or Granulating 25%    % Wound base Yellow/Fibrinous Exudate 75%    Peri-wound Assessment Edema;Erythema (blanchable)  Wound Length (cm) 3 cm    Wound Width (cm) 2.5 cm    Wound Depth (cm) 0.3 cm    Wound Volume (cm^3) 2.25 cm^3    Wound Surface Area (cm^2) 7.5 cm^2    Margins Unattached edges (unapproximated)    Drainage Amount Scant    Drainage Description Serosanguineous    Treatment Debridement (Selective);Cleansed    Selective Debridement - Location entire wound bed of Rt LE    Selective Debridement - Tools Used Forceps;Scalpel    Selective Debridement - Tissue Removed slough    Wound Therapy - Clinical Statement Improved granulation of woundbed noted today, however redness persists.  Outlined redness today with marker and had previous therapist inspect wound regarding increased redness.  Therapist to f/u wiht MD regarding possible need for antibiotic.  Also noted a rash like patches on plantar surface of foot (see media) that we will watch for changes. No open areas or drainage present but appears to be pus filled areas.   continued with compression and honey hydrogel.  Moisurized LE well prior to bandaging.    Wound Therapy - Functional Problem List gait and balance    Factors Delaying/Impairing Wound Healing Diabetes Mellitus;Vascular compromise;Altered sensation    Wound Therapy - Frequency 2X / week    Wound Therapy - Current Recommendations --   pump and juxtafit   Wound Plan continue to debride, change dressings, and reduce LE edema to promote wound healing    Dressing  medihoney hydrogel, tubular netting #6, profore lite with 1/4" foam                     PT Short Term Goals - 12/06/20 1235      PT SHORT TERM GOAL #1   Title Patient will have at least 50% granualtion tissue.    Time 3    Period Weeks    Status On-going    Target Date 12/25/20             PT Long Term Goals - 12/06/20 1235      PT LONG TERM GOAL #1   Title Patient's wound will be healed to reduce the risk of infection.    Time 6    Period Weeks    Status On-going      PT LONG TERM GOAL #2   Title Patient will be able to verbalize completion of daily skin checks to reduce the risk of further wound development.    Time 6    Period Weeks    Status On-going                  Patient will benefit from skilled therapeutic intervention in order to improve the following deficits and impairments:     Visit Diagnosis: Ulcer of right pretibial region, limited to breakdown of skin (HCC)  Other abnormalities of gait and mobility     Problem List Patient Active Problem List   Diagnosis Date Noted  . Advanced nonexudative age-related macular degeneration of right eye without subfoveal involvement 11/15/2020  . Advanced nonexudative age-related macular degeneration of left eye without subfoveal involvement 11/15/2020  . Diabetes mellitus without complication ([place]) 03/50/0938  . Posterior vitreous detachment of both eyes 11/15/2020  . Gait disturbance, post-stroke 11/02/2017  . Aphasia as late effect of  cerebrovascular accident 11/02/2017  . 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 Aphasia   . PAF (paroxysmal atrial fibrillation) ([place])   . Diabetes mellitus type 2 in  obese ([place])   . Benign essential HTN   . Hypothyroidism   . Hyperlipidemia   . Stroke (cerebrum) ([place]) 10/02/2017  . Afib ([place]) 01/10/2017  . SOB (shortness of breath) 01/10/2017  . Acute diastolic CHF (congestive heart failure) ([place]) 01/10/2017  . Atrial fibrillation with RVR ([place]) 01/10/2017  . Claudication ([place]) 07/25/2016  . Paroxysmal atrial fibrillation ([place]) 07/25/2016  . Odynophagia 04/10/2014  . Allergic rhinitis 03/23/2014  . Chronic rhinitis 12/22/2013  . 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 Constipation 08/22/2011  . Obesity 09/20/2009  . G E R D 11/29/2007  . Celiac disease 11/29/2007   Teena Irani, PTA/CLT [phone]  Teena Irani 12/27/2020, 1:30 PM  [place], [place], [address] Phone: [phone]   Fax:  [phone]  Name: [name] MRN: [id number] Date of Birth: 1929-08-29

## 2021-01-01 ENCOUNTER — Other Ambulatory Visit: Payer: Self-pay

## 2021-01-01 ENCOUNTER — Ambulatory Visit (HOSPITAL_COMMUNITY): Payer: PPO | Admitting: Physical Therapy

## 2021-01-01 ENCOUNTER — Encounter (HOSPITAL_COMMUNITY): Payer: Self-pay | Admitting: Physical Therapy

## 2021-01-01 DIAGNOSIS — R2689 Other abnormalities of gait and mobility: Secondary | ICD-10-CM

## 2021-01-01 DIAGNOSIS — L97811 Non-pressure chronic ulcer of other part of right lower leg limited to breakdown of skin: Secondary | ICD-10-CM

## 2021-01-01 NOTE — Therapy (Signed)
[place], [place], [address] Phone: [phone]   Fax:  [phone]  Wound Care Therapy  Patient Details  Name: [name] MRN: [id number] Date of Birth: 12-10-1928 Referring Provider (PT): [name]   Encounter Date: 01/01/2021   PT End of Session - 01/01/21 1124    Visit Number 9    Number of Visits 12    Date for PT Re-Evaluation 01/15/21    Authorization Type [employer] (no auth req, no visit limit)    Progress Note Due on Visit 10    PT Start Time 1045    PT Stop Time 1120    PT Time Calculation (min) 35 min    Activity Tolerance Patient tolerated treatment well    Behavior During Therapy [facility] for tasks assessed/performed           Past Medical History:  Diagnosis Date  . Allergic rhinitis   . Asthmatic bronchitis   . Atrial fibrillation ([place])   . Celiac disease   . Cellulitis   . CKD (chronic kidney disease)   . Diabetes ([place])   . Diverticulosis   . Fx. left wrist 1987  . Gastric polyps   . GERD (gastroesophageal reflux disease)   . History of pneumonia   . Hypertension   . Hypothyroidism   . Iron deficiency anemia   . Melanoma ([place])   . Neuropathy   . PAF (paroxysmal atrial fibrillation) ([place])   . Prostate cancer ([place])   . REM sleep behavior disorder   . RLS (restless legs syndrome)   . Sleep apnea   . Spasticity   . Stroke ([place]) 09/2017  . Tremor   . Type 2 diabetes mellitus ([place])     Past Surgical History:  Procedure Laterality Date  . APPENDECTOMY  1978  . CARPAL TUNNEL RELEASE Left   . CATARACT EXTRACTION  03/2011   bilateral   . CHOLECYSTECTOMY  2001  . COLONOSCOPY W/ BIOPSIES  04/27/2008   diverticulosis, duodenitis  . FOOT SURGERY    . HERNIA REPAIR  1994   bilateral  . KNEE SURGERY Bilateral    x 2  . LUNG SURGERY  2001   for infection  . PROSTATECTOMY  2002  . UPPER GASTROINTESTINAL ENDOSCOPY  04/27/2008   celiac disease, gastric polyps     There were no vitals filed for this visit.      [facility] PT Assessment - 01/01/21 0001      Assessment   Medical Diagnosis Chronic venous stasis wound                   Wound Therapy - 01/01/21 0001    Subjective States he started 7 day course of antibiotics on Friday. Reports not discomfort with bandages and has them on    Patient and Family Stated Goals wound to heal    Date of Onset 09/06/20    Prior Treatments MD and self care    Evaluation and Treatment Procedures Explained to Patient/Family Yes    Evaluation and Treatment Procedures agreed to;Other (comment)    Wound Properties Date First Assessed: 12/04/20 Time First Assessed: 1545 Wound Type: Venous stasis ulcer Location: Leg Location Orientation: Right Present on Admission: Yes   Dressing Type Compression wrap   medihoney hydrogel, foam 1/4 inch   Dressing Changed Changed    Dressing Status Old drainage    Dressing Change Frequency PRN    Site / Wound Assessment Pink;Red;Yellow    %  Wound base Red or Granulating 30%    % Wound base Yellow/Fibrinous Exudate 70%    Peri-wound Assessment Edema;Erythema (blanchable)    Margins Unattached edges (unapproximated)    Drainage Amount Moderate    Drainage Description Serosanguineous    Treatment Debridement (Selective);Cleansed    Selective Debridement - Location entire wound bed of Rt LE    Selective Debridement - Tools Used Forceps;Scalpel    Selective Debridement - Tissue Removed slough    Wound Therapy - Clinical Statement Patient's redness reduced based on marked outlines, remarked area and continued with medihoney hydrogel. Spots on plantar aspect drying up in most areas. To note area on lateral side was red and irritated, applied extra cotton to this area. Continue with debridement and will continue to mark redness. Continued with  inch forma and profore lite.    Wound Therapy - Functional Problem List gait and balance    Factors Delaying/Impairing Wound Healing  Diabetes Mellitus;Vascular compromise;Altered sensation    Wound Therapy - Frequency 2X / week    Wound Therapy - Current Recommendations --   pump and juxtafit   Wound Plan continue to debride, change dressings, and reduce LE edema to promote wound healing    Dressing  medihoney hydrogel, tubular netting #6, profore lite with 1/4" foam                     PT Short Term Goals - 12/06/20 1235      PT SHORT TERM GOAL #1   Title Patient will have at least 50% granualtion tissue.    Time 3    Period Weeks    Status On-going    Target Date 12/25/20             PT Long Term Goals - 12/06/20 1235      PT LONG TERM GOAL #1   Title Patient's wound will be healed to reduce the risk of infection.    Time 6    Period Weeks    Status On-going      PT LONG TERM GOAL #2   Title Patient will be able to verbalize completion of daily skin checks to reduce the risk of further wound development.    Time 6    Period Weeks    Status On-going                 Plan - 01/01/21 1125    Clinical Impression Statement see above    Personal Factors and Comorbidities Age;Comorbidity 3+;Past/Current Experience;Fitness;Time since onset of injury/illness/exacerbation    Comorbidities DM, CKD, neuorpathy, Hx CVA    Examination-Activity Limitations Locomotion Level;Transfers;Squat;Stand;Dressing    Examination-Participation Restrictions Community Activity    Stability/Clinical Decision Making Stable/Uncomplicated    Rehab Potential Good    PT Frequency 2x / week    PT Duration 6 weeks    PT Treatment/Interventions Gait training;Stair training;Functional mobility training;DME Instruction;Therapeutic activities;Therapeutic exercise;Balance training;Neuromuscular re-education;Patient/family education;Compression bandaging;Manual lymph drainage;Manual techniques    PT Next Visit Plan continue to debride PRN and change dressings to promote wound healing    Consulted and Agree with Plan of  Care Patient;Family member/caregiver    Family Member Consulted wife           Patient will benefit from skilled therapeutic intervention in order to improve the following deficits and impairments:  Abnormal gait,Decreased skin integrity,Decreased mobility,Impaired sensation  Visit Diagnosis: Ulcer of right pretibial region, limited to breakdown of skin (HCC)  Other abnormalities of gait and  mobility     Problem List Patient Active Problem List   Diagnosis Date Noted  . Advanced nonexudative age-related macular degeneration of right eye without subfoveal involvement 11/15/2020  . Advanced nonexudative age-related macular degeneration of left eye without subfoveal involvement 11/15/2020  . Diabetes mellitus without complication ([place]) 94/04/6807  . Posterior vitreous detachment of both eyes 11/15/2020  . Gait disturbance, post-stroke 11/02/2017  . Aphasia as late effect of cerebrovascular accident 11/02/2017  . 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 Aphasia   . PAF (paroxysmal atrial fibrillation) ([place])   . Diabetes mellitus type 2 in obese ([place])   . Benign essential HTN   . Hypothyroidism   . Hyperlipidemia   . Stroke (cerebrum) ([place]) 10/02/2017  . Afib ([place]) 01/10/2017  . SOB (shortness of breath) 01/10/2017  . Acute diastolic CHF (congestive heart failure) ([employer]) 01/10/2017  . Atrial fibrillation with RVR ([place]) 01/10/2017  . Claudication ([place]) 07/25/2016  . Paroxysmal atrial fibrillation ([place]) 07/25/2016  . Odynophagia 04/10/2014  . Allergic rhinitis 03/23/2014  . Chronic rhinitis 12/22/2013  . 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 Constipation 08/22/2011  . Obesity 09/20/2009  . G E R D 11/29/2007  . Celiac disease 11/29/2007  1:51 PM, 01/01/21 Jerene Pitch, DPT Physical Therapy with [facility]  [phone] office  [place] [address] [place], [place], [address] Phone: [phone]    Fax:  [phone]  Name: LONZA SHIMABUKURO MRN: [id number] Date of Birth: Mar 05, 1929

## 2021-01-03 ENCOUNTER — Other Ambulatory Visit: Payer: Self-pay

## 2021-01-03 ENCOUNTER — Ambulatory Visit (HOSPITAL_COMMUNITY): Payer: PPO | Admitting: Physical Therapy

## 2021-01-03 DIAGNOSIS — R2689 Other abnormalities of gait and mobility: Secondary | ICD-10-CM

## 2021-01-03 DIAGNOSIS — L97811 Non-pressure chronic ulcer of other part of right lower leg limited to breakdown of skin: Secondary | ICD-10-CM

## 2021-01-03 DIAGNOSIS — E119 Type 2 diabetes mellitus without complications: Secondary | ICD-10-CM | POA: Diagnosis not present

## 2021-01-03 NOTE — Therapy (Addendum)
[place] [address] [place], [place], [address] Phone: [phone]   Fax:  [phone]  Wound Care Therapy Progress Note Reporting Period 12/04/2020 to 01/03/2021  See note below for Objective Data and Assessment of Progress/Goals.      Patient Details  Name: [name] MRN: [id number] Date of Birth: [birth year] Referring Provider (PT): [name]   Encounter Date: 01/03/2021   PT End of Session - 01/03/21 1318    Visit Number 10    Number of Visits 12    Date for PT Re-Evaluation 01/15/21    Authorization Type [employer] (no auth req, no visit limit)    Progress Note Due on Visit 20    PT Start Time 1132    PT Stop Time 1210    PT Time Calculation (min) 38 min    Activity Tolerance Patient tolerated treatment well    Behavior During Therapy [facility] for tasks assessed/performed           Past Medical History:  Diagnosis Date  . Allergic rhinitis   . Asthmatic bronchitis   . Atrial fibrillation ([place])   . Celiac disease   . Cellulitis   . CKD (chronic kidney disease)   . Diabetes ([place])   . Diverticulosis   . Fx. left wrist 1987  . Gastric polyps   . GERD (gastroesophageal reflux disease)   . History of pneumonia   . Hypertension   . Hypothyroidism   . Iron deficiency anemia   . Melanoma ([place])   . Neuropathy   . PAF (paroxysmal atrial fibrillation) ([place])   . Prostate cancer ([place])   . REM sleep behavior disorder   . RLS (restless legs syndrome)   . Sleep apnea   . Spasticity   . Stroke ([place]) 09/2017  . Tremor   . Type 2 diabetes mellitus ([employer])     Past Surgical History:  Procedure Laterality Date  . APPENDECTOMY  1978  . CARPAL TUNNEL RELEASE Left   . CATARACT EXTRACTION  03/2011   bilateral   . CHOLECYSTECTOMY  2001  . COLONOSCOPY W/ BIOPSIES  04/27/2008   diverticulosis, duodenitis  . FOOT SURGERY    . HERNIA REPAIR  1994   bilateral  . KNEE SURGERY Bilateral    x 2  . LUNG SURGERY   2001   for infection  . PROSTATECTOMY  2002  . UPPER GASTROINTESTINAL ENDOSCOPY  04/27/2008   celiac disease, gastric polyps    There were no vitals filed for this visit.               Wound Therapy - 01/03/21 1313    Subjective pt reports he is stil taking antibiotics and having no pain.    Patient and Family Stated Goals wound to heal    Date of Onset 09/06/20    Prior Treatments MD and self care    Evaluation and Treatment Procedures Explained to Patient/Family Yes    Evaluation and Treatment Procedures agreed to;Other (comment)    Wound Properties Date First Assessed: 12/04/20 Time First Assessed: 1545 Wound Type: Venous stasis ulcer Location: Leg Location Orientation: Right Present on Admission: Yes   Wound Image View All Images View Images    Dressing Type Compression wrap    Dressing Changed Changed    Dressing Status Old drainage    Dressing Change Frequency PRN    Site / Wound Assessment Pink;Red;Yellow    % Wound base Red or Granulating 30%  was 0%   % Wound base Yellow/Fibrinous Exudate 70%   was 100%   Peri-wound Assessment Edema;Erythema (blanchable)    Wound Length (cm) 3 cm   was 3.5 on 2/22   Wound Width (cm) 2.5 cm   was 2.8cm on 2/22   Wound Depth (cm) 0.3 cm   was 0.3cm on 2/22   Wound Volume (cm^3) 2.25 cm^3    Wound Surface Area (cm^2) 7.5 cm^2    Margins Unattached edges (unapproximated)    Drainage Amount Minimal    Drainage Description Serosanguineous    Treatment Debridement (Selective);Cleansed    Selective Debridement - Location entire wound bed of Rt LE    Selective Debridement - Tools Used Forceps;Scalpel    Selective Debridement - Tissue Removed slough    Wound Therapy - Clinical Statement Redness and size of wound remain unchanged from last visit.  wound has reduced in size, however from initial evaluation on 2/22.  Pt is still progressing toward goals.  If continues in slow pace of healing.  Pt tolerates debridement well, however  remains adherent and not easily removed.    Wound Therapy - Functional Problem List gait and balance    Factors Delaying/Impairing Wound Healing Diabetes Mellitus;Vascular compromise;Altered sensation    Wound Therapy - Frequency 2X / week    Wound Therapy - Current Recommendations --   pump and juxtafit   Wound Plan continue to debride, change dressings, and reduce LE edema to promote wound healing.  Possible ABI if continues to heal slowly or begins to lack in progress.    Dressing  medihoney hydrogel, tubular netting #6, profore lite with 1/4" foam                    PT Short Term Goals - 01/03/21 1344      PT SHORT TERM GOAL #1   Title Patient will have at least 50% granualtion tissue.    Time 3    Period Weeks    Status On-going    Target Date 12/25/20             PT Long Term Goals - 01/03/21 1344      PT LONG TERM GOAL #1   Title Patient's wound will be healed to reduce the risk of infection.    Time 6    Period Weeks    Status On-going      PT LONG TERM GOAL #2   Title Patient will be able to verbalize completion of daily skin checks to reduce the risk of further wound development.    Time 6    Period Weeks    Status On-going                  Patient will benefit from skilled therapeutic intervention in order to improve the following deficits and impairments:     Visit Diagnosis: Other abnormalities of gait and mobility  Ulcer of right pretibial region, limited to breakdown of skin [facility])     Problem List Patient Active Problem List   Diagnosis Date Noted  . Advanced nonexudative age-related macular degeneration of right eye without subfoveal involvement 11/15/2020  . Advanced nonexudative age-related macular degeneration of left eye without subfoveal involvement 11/15/2020  . Diabetes mellitus without complication ([place]) 34/74/2595  . Posterior vitreous detachment of both eyes 11/15/2020  . Gait disturbance, post-stroke 11/02/2017  .  Aphasia as late effect of cerebrovascular accident 11/02/2017  . 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  Aphasia   . PAF (paroxysmal atrial fibrillation) ([place])   . Diabetes mellitus type 2 in obese ([place])   . Benign essential HTN   . Hypothyroidism   . Hyperlipidemia   . Stroke (cerebrum) ([place]) 10/02/2017  . Afib ([place]) 01/10/2017  . SOB (shortness of breath) 01/10/2017  . Acute diastolic CHF (congestive heart failure) ([place]) 01/10/2017  . Atrial fibrillation with RVR ([place]) 01/10/2017  . Claudication ([place]) 07/25/2016  . Paroxysmal atrial fibrillation ([place]) 07/25/2016  . Odynophagia 04/10/2014  . Allergic rhinitis 03/23/2014  . Chronic rhinitis 12/22/2013  . 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 Constipation 08/22/2011  . Obesity 09/20/2009  . G E R D 11/29/2007  . Celiac disease 11/29/2007   I have read and agree with the above information. Patient has not met any goals at this time and is making slow progress with wound healing likely due to multiple co-morbidities affecting wound healing.  2:25 PM, 01/03/21 Mearl Latin PT, DPT Physical Therapist at [facility], PTA/CLT [phone]  Teena Irani 01/03/2021, 1:45 PM  [place] [address] [place], [place], [address] Phone: [phone]   Fax:  [phone]  Name: [name] MRN: [id number] Date of Birth: [birth year]

## 2021-01-08 ENCOUNTER — Ambulatory Visit (HOSPITAL_COMMUNITY): Payer: PPO | Admitting: Physical Therapy

## 2021-01-08 ENCOUNTER — Encounter (HOSPITAL_COMMUNITY): Payer: Self-pay | Admitting: Physical Therapy

## 2021-01-08 ENCOUNTER — Other Ambulatory Visit: Payer: Self-pay

## 2021-01-08 DIAGNOSIS — L97811 Non-pressure chronic ulcer of other part of right lower leg limited to breakdown of skin: Secondary | ICD-10-CM

## 2021-01-08 DIAGNOSIS — R2689 Other abnormalities of gait and mobility: Secondary | ICD-10-CM | POA: Diagnosis not present

## 2021-01-08 NOTE — Therapy (Signed)
[place], [place], [address] Phone: [phone]   Fax:  [phone]  Wound Care Therapy  Patient Details  Name: [name] MRN: [id number] Date of Birth: 01/05/1929 Referring Provider (PT): [name]   Encounter Date: 01/08/2021   PT End of Session - 01/08/21 1121    Visit Number 11    Number of Visits 12    Date for PT Re-Evaluation 01/15/21    Authorization Type [employer] (no auth req, no visit limit)    Progress Note Due on Visit 20    PT Start Time 1045    PT Stop Time 1120    PT Time Calculation (min) 35 min    Activity Tolerance Patient tolerated treatment well    Behavior During Therapy [facility] for tasks assessed/performed           Past Medical History:  Diagnosis Date  . Allergic rhinitis   . Asthmatic bronchitis   . Atrial fibrillation ([place])   . Celiac disease   . Cellulitis   . CKD (chronic kidney disease)   . Diabetes ([place])   . Diverticulosis   . Fx. left wrist 1987  . Gastric polyps   . GERD (gastroesophageal reflux disease)   . History of pneumonia   . Hypertension   . Hypothyroidism   . Iron deficiency anemia   . Melanoma ([place])   . Neuropathy   . PAF (paroxysmal atrial fibrillation) ([place])   . Prostate cancer ([place])   . REM sleep behavior disorder   . RLS (restless legs syndrome)   . Sleep apnea   . Spasticity   . Stroke ([place]) 09/2017  . Tremor   . Type 2 diabetes mellitus ([place])     Past Surgical History:  Procedure Laterality Date  . APPENDECTOMY  1978  . CARPAL TUNNEL RELEASE Left   . CATARACT EXTRACTION  03/2011   bilateral   . CHOLECYSTECTOMY  2001  . COLONOSCOPY W/ BIOPSIES  04/27/2008   diverticulosis, duodenitis  . FOOT SURGERY    . HERNIA REPAIR  1994   bilateral  . KNEE SURGERY Bilateral    x 2  . LUNG SURGERY  2001   for infection  . PROSTATECTOMY  2002  . UPPER GASTROINTESTINAL ENDOSCOPY  04/27/2008   celiac disease, gastric polyps     There were no vitals filed for this visit.      [facility] PT Assessment - 01/08/21 0001      Assessment   Medical Diagnosis Chronic venous stasis wound                   Wound Therapy - 01/08/21 0001    Subjective Reports occasional painin leg bbut no other pain. Wife curious about wound a would like to come back today    Patient and Family Stated Goals wound to heal    Date of Onset 09/06/20    Prior Treatments MD and self care    Evaluation and Treatment Procedures Explained to Patient/Family Yes    Evaluation and Treatment Procedures agreed to;Other (comment)    Wound Properties Date First Assessed: 12/04/20 Time First Assessed: 1545 Wound Type: Venous stasis ulcer Location: Leg Location Orientation: Right Present on Admission: Yes   Dressing Type Compression wrap   medhioney hydrogel   Dressing Changed Changed    Dressing Status Old drainage    Dressing Change Frequency PRN    Site / Wound Assessment Pink;Yellow;Bleeding    %  Wound base Red or Granulating 30%    % Wound base Yellow/Fibrinous Exudate 70%    Peri-wound Assessment Edema;Erythema (blanchable)   dry   Margins Unattached edges (unapproximated)    Drainage Amount Minimal    Drainage Description Serosanguineous    Treatment Cleansed;Debridement (Selective)    Selective Debridement - Location entire wound bed of Rt LE    Selective Debridement - Tools Used Forceps    Selective Debridement - Tissue Removed slough    Wound Therapy - Clinical Statement Patient wound continued to heal slowly but overall thickness of slough is thinning compared to previously, able to see granulation tissue on other side of slough. Continued with debridement and scrubbed feet where previous rash was seen, no spots of rash noted after scrubbing dry skin off the bottom of the foot. Continued mild redness along base of fifth metatarsal so additional cotton was placed here. Continued with medihoney but added moist gauze on top to help  promote autolytic debridement secondary to dry nature of wound. Discussed possible benefits of santyl if wound granulation does not improve in the next few weeks. Continued profore lite and netting #5.    Wound Therapy - Functional Problem List gait and balance    Factors Delaying/Impairing Wound Healing Diabetes Mellitus;Vascular compromise;Altered sensation    Wound Therapy - Frequency 2X / week    Wound Therapy - Current Recommendations --   pump and juxtafit   Wound Plan continue to debride, change dressings, and reduce LE edema to promote wound healing.  Possible ABI if continues to heal slowly or begins to lack in progress.    Dressing  medihoney on gauze with moist gauze on top, tubular netting #6, profore lite with 1/4" foam                     PT Short Term Goals - 01/03/21 1344      PT SHORT TERM GOAL #1   Title Patient will have at least 50% granualtion tissue.    Time 3    Period Weeks    Status On-going    Target Date 12/25/20             PT Long Term Goals - 01/03/21 1344      PT LONG TERM GOAL #1   Title Patient's wound will be healed to reduce the risk of infection.    Time 6    Period Weeks    Status On-going      PT LONG TERM GOAL #2   Title Patient will be able to verbalize completion of daily skin checks to reduce the risk of further wound development.    Time 6    Period Weeks    Status On-going                 Plan - 01/08/21 1121    Clinical Impression Statement see above    Personal Factors and Comorbidities Age;Comorbidity 3+;Past/Current Experience;Fitness;Time since onset of injury/illness/exacerbation    Comorbidities DM, CKD, neuorpathy, Hx CVA    Examination-Activity Limitations Locomotion Level;Transfers;Squat;Stand;Dressing    Examination-Participation Restrictions Community Activity    Stability/Clinical Decision Making Stable/Uncomplicated    Rehab Potential Good    PT Frequency 2x / week    PT Duration 6 weeks     PT Treatment/Interventions Gait training;Stair training;Functional mobility training;DME Instruction;Therapeutic activities;Therapeutic exercise;Balance training;Neuromuscular re-education;Patient/family education;Compression bandaging;Manual lymph drainage;Manual techniques    PT Next Visit Plan continue to debride PRN and change dressings to promote  wound healing    Consulted and Agree with Plan of Care Patient;Family member/caregiver    Family Member Consulted wife           Patient will benefit from skilled therapeutic intervention in order to improve the following deficits and impairments:  Abnormal gait,Decreased skin integrity,Decreased mobility,Impaired sensation  Visit Diagnosis: Other abnormalities of gait and mobility  Ulcer of right pretibial region, limited to breakdown of skin [facility])     Problem List Patient Active Problem List   Diagnosis Date Noted  . Advanced nonexudative age-related macular degeneration of right eye without subfoveal involvement 11/15/2020  . Advanced nonexudative age-related macular degeneration of left eye without subfoveal involvement 11/15/2020  . Diabetes mellitus without complication ([place]) 74/14/2395  . Posterior vitreous detachment of both eyes 11/15/2020  . Gait disturbance, post-stroke 11/02/2017  . Aphasia as late effect of cerebrovascular accident 11/02/2017  . 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 Aphasia   . PAF (paroxysmal atrial fibrillation) ([place])   . Diabetes mellitus type 2 in obese ([place])   . Benign essential HTN   . Hypothyroidism   . Hyperlipidemia   . Stroke (cerebrum) ([place]) 10/02/2017  . Afib ([place]) 01/10/2017  . SOB (shortness of breath) 01/10/2017  . Acute diastolic CHF (congestive heart failure) ([place]) 01/10/2017  . Atrial fibrillation with RVR ([place]) 01/10/2017  . Claudication ([place]) 07/25/2016  . Paroxysmal atrial fibrillation ([place]) 07/25/2016  . Odynophagia 04/10/2014  . Allergic rhinitis 03/23/2014   . Chronic rhinitis 12/22/2013  . 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 Constipation 08/22/2011  . Obesity 09/20/2009  . G E R D 11/29/2007  . Celiac disease 11/29/2007   11:26 AM, 01/08/21 Jerene Pitch, DPT Physical Therapy with [facility]  [phone] office  [place] [address] [place], [place], [address] Phone: [phone]   Fax:  [phone]  Name: JASSEN SARVER MRN: [id number] Date of Birth: Jun 17, 1929

## 2021-01-09 DIAGNOSIS — E1140 Type 2 diabetes mellitus with diabetic neuropathy, unspecified: Secondary | ICD-10-CM | POA: Diagnosis not present

## 2021-01-09 DIAGNOSIS — R600 Localized edema: Secondary | ICD-10-CM | POA: Diagnosis not present

## 2021-01-09 DIAGNOSIS — I5032 Chronic diastolic (congestive) heart failure: Secondary | ICD-10-CM | POA: Diagnosis not present

## 2021-01-09 DIAGNOSIS — I129 Hypertensive chronic kidney disease with stage 1 through stage 4 chronic kidney disease, or unspecified chronic kidney disease: Secondary | ICD-10-CM | POA: Diagnosis not present

## 2021-01-09 DIAGNOSIS — I87311 Chronic venous hypertension (idiopathic) with ulcer of right lower extremity: Secondary | ICD-10-CM | POA: Diagnosis not present

## 2021-01-10 ENCOUNTER — Ambulatory Visit (HOSPITAL_COMMUNITY): Payer: PPO

## 2021-01-11 ENCOUNTER — Encounter (HOSPITAL_COMMUNITY): Payer: Self-pay | Admitting: Physical Therapy

## 2021-01-11 ENCOUNTER — Other Ambulatory Visit: Payer: Self-pay

## 2021-01-11 ENCOUNTER — Ambulatory Visit (HOSPITAL_COMMUNITY): Payer: PPO | Attending: Internal Medicine | Admitting: Physical Therapy

## 2021-01-11 DIAGNOSIS — R2689 Other abnormalities of gait and mobility: Secondary | ICD-10-CM | POA: Diagnosis not present

## 2021-01-11 DIAGNOSIS — L97811 Non-pressure chronic ulcer of other part of right lower leg limited to breakdown of skin: Secondary | ICD-10-CM | POA: Diagnosis not present

## 2021-01-11 NOTE — Therapy (Signed)
[place], [place], [address] Phone: [phone]   Fax:  [phone]  Wound Care Therapy  Patient Details  Name: [name] MRN: [id number] Date of Birth: 04/05/1929 Referring Provider (PT): [name]   Encounter Date: 01/11/2021   PT End of Session - 01/11/21 1656    Visit Number 12    Number of Visits 24    Date for PT Re-Evaluation 01/15/21    Authorization Type [employer] (no auth req, no visit limit)    Progress Note Due on Visit 20    PT Start Time 1615    PT Stop Time 1650    PT Time Calculation (min) 35 min    Activity Tolerance Patient tolerated treatment well    Behavior During Therapy [facility] for tasks assessed/performed           Past Medical History:  Diagnosis Date  . Allergic rhinitis   . Asthmatic bronchitis   . Atrial fibrillation ([place])   . Celiac disease   . Cellulitis   . CKD (chronic kidney disease)   . Diabetes ([place])   . Diverticulosis   . Fx. left wrist 1987  . Gastric polyps   . GERD (gastroesophageal reflux disease)   . History of pneumonia   . Hypertension   . Hypothyroidism   . Iron deficiency anemia   . Melanoma ([place])   . Neuropathy   . PAF (paroxysmal atrial fibrillation) ([place])   . Prostate cancer ([place])   . REM sleep behavior disorder   . RLS (restless legs syndrome)   . Sleep apnea   . Spasticity   . Stroke ([place]) 09/2017  . Tremor   . Type 2 diabetes mellitus ([place])     Past Surgical History:  Procedure Laterality Date  . APPENDECTOMY  1978  . CARPAL TUNNEL RELEASE Left   . CATARACT EXTRACTION  03/2011   bilateral   . CHOLECYSTECTOMY  2001  . COLONOSCOPY W/ BIOPSIES  04/27/2008   diverticulosis, duodenitis  . FOOT SURGERY    . HERNIA REPAIR  1994   bilateral  . KNEE SURGERY Bilateral    x 2  . LUNG SURGERY  2001   for infection  . PROSTATECTOMY  2002  . UPPER GASTROINTESTINAL ENDOSCOPY  04/27/2008   celiac disease, gastric polyps     There were no vitals filed for this visit.               Wound Therapy - 01/11/21 0001    Subjective Pt states no pain    Patient and Family Stated Goals wound to heal    Date of Onset 09/06/20    Prior Treatments MD and self care    Evaluation and Treatment Procedures Explained to Patient/Family Yes    Evaluation and Treatment Procedures agreed to;Other (comment)    Wound Properties Date First Assessed: 12/04/20 Time First Assessed: 1545 Wound Type: Venous stasis ulcer Location: Leg Location Orientation: Right Present on Admission: Yes   Dressing Type Compression wrap   medihoney   Dressing Changed Changed    Dressing Status Old drainage    Dressing Change Frequency PRN    Site / Wound Assessment Pink;Yellow    % Wound base Red or Granulating 30%    % Wound base Yellow/Fibrinous Exudate 70%    Peri-wound Assessment Erythema (blanchable)    Wound Length (cm) 3 cm    Wound Width (cm) 2.3 cm    Wound Depth (cm)  0.3 cm    Wound Volume (cm^3) 2.07 cm^3    Wound Surface Area (cm^2) 6.9 cm^2    Margins Attached edges (approximated)    Drainage Amount Scant    Drainage Description Serous    Treatment Cleansed;Debridement (Selective)    Selective Debridement - Location entire wound bed bladed    Selective Debridement - Tools Used Forceps;Scalpel    Selective Debridement - Tissue Removed slough    Wound Therapy - Clinical Statement Pt redmess has not extended beyond marked area.  Wound continues to be dry, blading wound bed resulted in increased granulation buds noted.    Wound Therapy - Functional Problem List gait and balance    Factors Delaying/Impairing Wound Healing Diabetes Mellitus;Vascular compromise;Altered sensation    Wound Therapy - Frequency 2X / week    Wound Plan continue to debride, change dressings, and reduce LE edema to promote wound healing.  Possible ABI if continues to heal slowly or begins to lack in progress.    Dressing  medihoney, vaseline,  profore lite witn 1/4 " foam used at LE to assist in keeping bandage from slipping down.                     PT Short Term Goals - 01/03/21 1344      PT SHORT TERM GOAL #1   Title Patient will have at least 50% granualtion tissue.    Time 3    Period Weeks    Status On-going    Target Date 12/25/20             PT Long Term Goals - 01/03/21 1344      PT LONG TERM GOAL #1   Title Patient's wound will be healed to reduce the risk of infection.    Time 6    Period Weeks    Status On-going      PT LONG TERM GOAL #2   Title Patient will be able to verbalize completion of daily skin checks to reduce the risk of further wound development.    Time 6    Period Weeks    Status On-going                 Plan - 01/11/21 1657    Clinical Impression Statement see above    Personal Factors and Comorbidities Age;Comorbidity 3+;Past/Current Experience;Fitness;Time since onset of injury/illness/exacerbation    Comorbidities DM, CKD, neuorpathy, Hx CVA    Examination-Activity Limitations Locomotion Level;Transfers;Squat;Stand;Dressing    Examination-Participation Restrictions Community Activity    Stability/Clinical Decision Making Stable/Uncomplicated    Rehab Potential Good    PT Frequency 2x / week    PT Duration 12 weeks   increased time secondary to slow healing wound   PT Treatment/Interventions Gait training;Stair training;Functional mobility training;DME Instruction;Therapeutic activities;Therapeutic exercise;Balance training;Neuromuscular re-education;Patient/family education;Compression bandaging;Manual lymph drainage;Manual techniques    PT Next Visit Plan continue to debride PRN and change dressings to promote wound healing    Consulted and Agree with Plan of Care Patient;Family member/caregiver    Family Member Consulted wife           Patient will benefit from skilled therapeutic intervention in order to improve the following deficits and impairments:   Abnormal gait,Decreased skin integrity,Decreased mobility,Impaired sensation  Visit Diagnosis: Ulcer of right pretibial region, limited to breakdown of skin ([place])  Other abnormalities of gait and mobility     Problem List Patient Active Problem List   Diagnosis Date Noted  . Advanced nonexudative  age-related macular degeneration of right eye without subfoveal involvement 11/15/2020  . Advanced nonexudative age-related macular degeneration of left eye without subfoveal involvement 11/15/2020  . Diabetes mellitus without complication ([place]) 62/44/6950  . Posterior vitreous detachment of both eyes 11/15/2020  . Gait disturbance, post-stroke 11/02/2017  . Aphasia as late effect of cerebrovascular accident 11/02/2017  . 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 Aphasia   . PAF (paroxysmal atrial fibrillation) ([place])   . Diabetes mellitus type 2 in obese ([place])   . Benign essential HTN   . Hypothyroidism   . Hyperlipidemia   . Stroke (cerebrum) ([place]) 10/02/2017  . Afib ([place]) 01/10/2017  . SOB (shortness of breath) 01/10/2017  . Acute diastolic CHF (congestive heart failure) ([place]) 01/10/2017  . Atrial fibrillation with RVR ([place]) 01/10/2017  . Claudication ([place]) 07/25/2016  . Paroxysmal atrial fibrillation ([place]) 07/25/2016  . Odynophagia 04/10/2014  . Allergic rhinitis 03/23/2014  . Chronic rhinitis 12/22/2013  . 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 Constipation 08/22/2011  . Obesity 09/20/2009  . G E R D 11/29/2007  . Celiac disease 11/29/2007    Rayetta Humphrey, PT CLT [phone] 01/11/2021, 4:59 PM  [place] [address] [place], [place], [address] Phone: [phone]   Fax:  [phone]  Name: CADARIUS NEVARES MRN: [id number] Date of Birth: 1929/05/14

## 2021-01-15 ENCOUNTER — Ambulatory Visit (HOSPITAL_COMMUNITY): Payer: PPO | Admitting: Physical Therapy

## 2021-01-15 ENCOUNTER — Encounter (HOSPITAL_COMMUNITY): Payer: Self-pay | Admitting: Physical Therapy

## 2021-01-15 ENCOUNTER — Other Ambulatory Visit: Payer: Self-pay

## 2021-01-15 DIAGNOSIS — R2689 Other abnormalities of gait and mobility: Secondary | ICD-10-CM

## 2021-01-15 DIAGNOSIS — L97811 Non-pressure chronic ulcer of other part of right lower leg limited to breakdown of skin: Secondary | ICD-10-CM | POA: Diagnosis not present

## 2021-01-15 NOTE — Therapy (Signed)
[place], [place], [address] Phone: [phone]   Fax:  [phone]  Wound Care Therapy, Progress Note and RECERT   Patient Details  Name: [name] MRN: [id number] Date of Birth: 1928/10/15 Referring Provider (PT): [name]  Progress Note Reporting Period 12/06/20 to 01/15/21  See note below for Objective Data and Assessment of Progress/Goals.      Encounter Date: 01/15/2021   PT End of Session - 01/15/21 1128    Visit Number 13    Number of Visits 25    Date for PT Re-Evaluation 02/26/21    Authorization Type [employer] (no auth req, no visit limit)    Progress Note Due on Visit 20    PT Start Time 1049    PT Stop Time 1125    PT Time Calculation (min) 36 min    Activity Tolerance Patient tolerated treatment well    Behavior During Therapy [facility] for tasks assessed/performed           Past Medical History:  Diagnosis Date  . Allergic rhinitis   . Asthmatic bronchitis   . Atrial fibrillation ([place])   . Celiac disease   . Cellulitis   . CKD (chronic kidney disease)   . Diabetes ([place])   . Diverticulosis   . Fx. left wrist 1987  . Gastric polyps   . GERD (gastroesophageal reflux disease)   . History of pneumonia   . Hypertension   . Hypothyroidism   . Iron deficiency anemia   . Melanoma ([place])   . Neuropathy   . PAF (paroxysmal atrial fibrillation) ([place])   . Prostate cancer ([place])   . REM sleep behavior disorder   . RLS (restless legs syndrome)   . Sleep apnea   . Spasticity   . Stroke ([place]) 09/2017  . Tremor   . Type 2 diabetes mellitus ([place])     Past Surgical History:  Procedure Laterality Date  . APPENDECTOMY  1978  . CARPAL TUNNEL RELEASE Left   . CATARACT EXTRACTION  03/2011   bilateral   . CHOLECYSTECTOMY  2001  . COLONOSCOPY W/ BIOPSIES  04/27/2008   diverticulosis, duodenitis  . FOOT SURGERY    . HERNIA REPAIR  1994   bilateral  . KNEE SURGERY Bilateral    x 2   . LUNG SURGERY  2001   for infection  . PROSTATECTOMY  2002  . UPPER GASTROINTESTINAL ENDOSCOPY  04/27/2008   celiac disease, gastric polyps    There were no vitals filed for this visit.      [facility] PT Assessment - 01/15/21 0001      Assessment   Medical Diagnosis Chronic venous stasis wound                   Wound Therapy - 01/15/21 0001    Subjective Reports no pain since last session    Patient and Family Stated Goals wound to heal    Date of Onset 09/06/20    Prior Treatments MD and self care    Evaluation and Treatment Procedures Explained to Patient/Family Yes    Evaluation and Treatment Procedures agreed to;Other (comment)    Wound Properties Date First Assessed: 12/04/20 Time First Assessed: 1545 Wound Type: Venous stasis ulcer Location: Leg Location Orientation: Right Present on Admission: Yes   Wound Image View All Images View Images    Dressing Type Compression wrap   medihoney on 2x2   Dressing Changed Changed  Dressing Status Old drainage    Dressing Change Frequency PRN    Site / Wound Assessment Pink;Yellow    % Wound base Red or Granulating 35%    % Wound base Yellow/Fibrinous Exudate 65%    Peri-wound Assessment Erythema (blanchable);Edema    Margins Unattached edges (unapproximated)    Drainage Amount Minimal    Drainage Description Serosanguineous    Treatment Cleansed;Debridement (Selective)    Selective Debridement - Location entire wound bed    Selective Debridement - Tools Used Forceps    Selective Debridement - Tissue Removed slough    Wound Therapy - Clinical Statement No goals met at this time. Patient wound continues to heal slowly. Increased bleeding in wound bed but little change in granulation tissue. Discussed possible benefits of Santyl and ABI to make sure adequate blood flow to the area and to see if enzymatic debridement would be helpful. Will call MD about santyl and ABI suggestion. Extending POC as patient continues to have  open wound with delayed healing. Continued with medihoney and profore lite, did not put  inch foam in dressing but will add back in if dressing falls down between this and next session. Patient would continue to benefit from skilled physical therapy at this time.    Wound Therapy - Functional Problem List gait and balance    Factors Delaying/Impairing Wound Healing Diabetes Mellitus;Vascular compromise;Altered sensation    Wound Therapy - Frequency 2X / week    Wound Plan continue to debride, change dressings, and reduce LE edema to promote wound healing.  Possible ABI if continues to heal slowly or begins to lack in progress.    Dressing  medihoney, vaseline, profore lite witn 1/4 " foam used at LE to assist in keeping bandage from slipping down.                     PT Short Term Goals - 01/03/21 1344      PT SHORT TERM GOAL #1   Title Patient will have at least 50% granualtion tissue.    Time 3    Period Weeks    Status On-going    Target Date 12/25/20             PT Long Term Goals - 01/03/21 1344      PT LONG TERM GOAL #1   Title Patient's wound will be healed to reduce the risk of infection.    Time 6    Period Weeks    Status On-going      PT LONG TERM GOAL #2   Title Patient will be able to verbalize completion of daily skin checks to reduce the risk of further wound development.    Time 6    Period Weeks    Status On-going                 Plan - 01/15/21 1133    Personal Factors and Comorbidities Age;Comorbidity 3+;Past/Current Experience;Fitness;Time since onset of injury/illness/exacerbation    Comorbidities DM, CKD, neuorpathy, Hx CVA    Examination-Activity Limitations Locomotion Level;Transfers;Squat;Stand;Dressing    Examination-Participation Restrictions Community Activity    Stability/Clinical Decision Making Stable/Uncomplicated    Rehab Potential Good    PT Frequency 2x / week    PT Duration 6 weeks   increased time secondary to slow  healing wound   PT Treatment/Interventions Gait training;Stair training;Functional mobility training;DME Instruction;Therapeutic activities;Therapeutic exercise;Balance training;Neuromuscular re-education;Patient/family education;Compression bandaging;Manual lymph drainage;Manual techniques    PT Next Visit Plan continue  to debride PRN and change dressings to promote wound healing    Consulted and Agree with Plan of Care Patient;Family member/caregiver    Family Member Consulted wife           Patient will benefit from skilled therapeutic intervention in order to improve the following deficits and impairments:  Abnormal gait,Decreased skin integrity,Decreased mobility,Impaired sensation  Visit Diagnosis: Ulcer of right pretibial region, limited to breakdown of skin ([place])  Other abnormalities of gait and mobility     Problem List Patient Active Problem List   Diagnosis Date Noted  . Advanced nonexudative age-related macular degeneration of right eye without subfoveal involvement 11/15/2020  . Advanced nonexudative age-related macular degeneration of left eye without subfoveal involvement 11/15/2020  . Diabetes mellitus without complication ([place]) 57/50/5183  . Posterior vitreous detachment of both eyes 11/15/2020  . Gait disturbance, post-stroke 11/02/2017  . Aphasia as late effect of cerebrovascular accident 11/02/2017  . 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 Aphasia   . PAF (paroxysmal atrial fibrillation) ([place])   . Diabetes mellitus type 2 in obese ([place])   . Benign essential HTN   . Hypothyroidism   . Hyperlipidemia   . Stroke (cerebrum) ([place]) 10/02/2017  . Afib ([place]) 01/10/2017  . SOB (shortness of breath) 01/10/2017  . Acute diastolic CHF (congestive heart failure) ([place]) 01/10/2017  . Atrial fibrillation with RVR ([place]) 01/10/2017  . Claudication ([place]) 07/25/2016  . Paroxysmal atrial fibrillation ([place]) 07/25/2016  . Odynophagia 04/10/2014  . Allergic  rhinitis 03/23/2014  . Chronic rhinitis 12/22/2013  . 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 Constipation 08/22/2011  . Obesity 09/20/2009  . G E R D 11/29/2007  . Celiac disease 11/29/2007    11:34 AM, 01/15/21 Jerene Pitch, DPT Physical Therapy with [facility]  [phone] office  [place] [address] [place], [place], [address] Phone: [phone]   Fax:  [phone]  Name: [name] MRN: [id number] Date of Birth: 12-Nov-1928

## 2021-01-17 ENCOUNTER — Other Ambulatory Visit: Payer: Self-pay

## 2021-01-17 ENCOUNTER — Telehealth (HOSPITAL_COMMUNITY): Payer: Self-pay | Admitting: Physical Therapy

## 2021-01-17 ENCOUNTER — Ambulatory Visit (HOSPITAL_COMMUNITY): Payer: PPO | Admitting: Physical Therapy

## 2021-01-17 DIAGNOSIS — L97811 Non-pressure chronic ulcer of other part of right lower leg limited to breakdown of skin: Secondary | ICD-10-CM | POA: Diagnosis not present

## 2021-01-17 DIAGNOSIS — R2689 Other abnormalities of gait and mobility: Secondary | ICD-10-CM

## 2021-01-17 NOTE — Therapy (Signed)
[place], [place], [address] Phone: [phone]   Fax:  [phone]  Wound Care Therapy  Patient Details  Name: [name] MRN: [id number] Date of Birth: Apr 10, 1929 Referring Provider (PT): [name]   Encounter Date: 01/17/2021   PT End of Session - 01/17/21 1143    Visit Number 14    Number of Visits 32    Date for PT Re-Evaluation 02/26/21    Authorization Type [employer] (no auth req, no visit limit)    Progress Note Due on Visit 20    PT Start Time 1048    PT Stop Time 1132    PT Time Calculation (min) 44 min    Activity Tolerance Patient tolerated treatment well    Behavior During Therapy [facility] for tasks assessed/performed           Past Medical History:  Diagnosis Date  . Allergic rhinitis   . Asthmatic bronchitis   . Atrial fibrillation ([place])   . Celiac disease   . Cellulitis   . CKD (chronic kidney disease)   . Diabetes ([place])   . Diverticulosis   . Fx. left wrist 1987  . Gastric polyps   . GERD (gastroesophageal reflux disease)   . History of pneumonia   . Hypertension   . Hypothyroidism   . Iron deficiency anemia   . Melanoma ([place])   . Neuropathy   . PAF (paroxysmal atrial fibrillation) ([place])   . Prostate cancer ([place])   . REM sleep behavior disorder   . RLS (restless legs syndrome)   . Sleep apnea   . Spasticity   . Stroke ([place]) 09/2017  . Tremor   . Type 2 diabetes mellitus ([place])     Past Surgical History:  Procedure Laterality Date  . APPENDECTOMY  1978  . CARPAL TUNNEL RELEASE Left   . CATARACT EXTRACTION  03/2011   bilateral   . CHOLECYSTECTOMY  2001  . COLONOSCOPY W/ BIOPSIES  04/27/2008   diverticulosis, duodenitis  . FOOT SURGERY    . HERNIA REPAIR  1994   bilateral  . KNEE SURGERY Bilateral    x 2  . LUNG SURGERY  2001   for infection  . PROSTATECTOMY  2002  . UPPER GASTROINTESTINAL ENDOSCOPY  04/27/2008   celiac disease, gastric polyps     There were no vitals filed for this visit.      [facility] PT Assessment - 01/17/21 0001      Assessment   Medical Diagnosis Chronic venous stasis wound                   Wound Therapy - 01/17/21 0001    Subjective Reports no change, no real pain noted. States the MD put order in for santyl but he has not heard back about ABI. States that he called them and they did not get the PT message.    Patient and Family Stated Goals wound to heal    Date of Onset 09/06/20    Prior Treatments MD and self care    Evaluation and Treatment Procedures Explained to Patient/Family Yes    Evaluation and Treatment Procedures agreed to;Other (comment)    Wound Properties Date First Assessed: 12/04/20 Time First Assessed: 1545 Wound Type: Venous stasis ulcer Location: Leg Location Orientation: Right Present on Admission: Yes   Wound Image View All Images View Images    Dressing Type Compression wrap   medihoney   Dressing Changed  Changed    Dressing Status Old drainage    Dressing Change Frequency PRN    Site / Wound Assessment Bleeding;Yellow;Red;Painful   dried blood/necrotic tissue   % Wound base Red or Granulating 20%    % Wound base Yellow/Fibrinous Exudate 50%    % Wound base Black/Eschar 30%    Peri-wound Assessment Erythema (blanchable);Edema;Maceration   pain, blisters   Wound Length (cm) 3 cm   was 3   Wound Width (cm) 2.5 cm   was 2.3   Wound Depth (cm) 0.3 cm   was .3   Wound Volume (cm^3) 2.25 cm^3    Wound Surface Area (cm^2) 7.5 cm^2    Margins Unattached edges (unapproximated)    Drainage Amount Minimal    Drainage Description Serosanguineous;No odor    Treatment Cleansed;Debridement (Selective)    Selective Debridement - Location entire wound bed    Selective Debridement - Tools Used Forceps    Selective Debridement - Tissue Removed slough    Wound Therapy - Clinical Statement Patients wound bed drier and darker with dried blood on this date despite slight maceration  to periwound tissue. Redness around wound continues to be the same (not expanding further from wound bed) but was warm and painful today. Blisters also noted along anterior upper shin today (5 total), covered with xeroform. Profore lite slide down slightly so added foam back into dressing. Concerned about abrupt change in wound bed dryness, pain and warmth in periwound region. Educated patient on signs and symptoms progressing if wound infected and what to do if symptoms present. Will call MD again about change in wound presentation and possible benefits for antibiotics. Will follow up with patient next session about Santyl and antibiotics. Changing frequency to 3x/week for anticipated use of Santyl.    Wound Therapy - Functional Problem List gait and balance    Factors Delaying/Impairing Wound Healing Diabetes Mellitus;Vascular compromise;Altered sensation    Wound Therapy - Frequency 3X / week    Wound Plan continue to debride, change dressings, and reduce LE edema to promote wound healing.  Possible ABI if continues to heal slowly or begins to lack in progress.    Dressing  medihoney, vaseline, profore lite witn 1/4 " foam used at LE to assist in keeping bandage from slipping down.                     PT Short Term Goals - 01/03/21 1344      PT SHORT TERM GOAL #1   Title Patient will have at least 50% granualtion tissue.    Time 3    Period Weeks    Status On-going    Target Date 12/25/20             PT Long Term Goals - 01/03/21 1344      PT LONG TERM GOAL #1   Title Patient's wound will be healed to reduce the risk of infection.    Time 6    Period Weeks    Status On-going      PT LONG TERM GOAL #2   Title Patient will be able to verbalize completion of daily skin checks to reduce the risk of further wound development.    Time 6    Period Weeks    Status On-going                 Plan - 01/17/21 1144    Clinical Impression Statement see above    Personal  Factors and Comorbidities Age;Comorbidity 3+;Past/Current Experience;Fitness;Time since onset of injury/illness/exacerbation    Comorbidities DM, CKD, neuorpathy, Hx CVA    Examination-Activity Limitations Locomotion Level;Transfers;Squat;Stand;Dressing    Examination-Participation Restrictions Community Activity    Stability/Clinical Decision Making Stable/Uncomplicated    Rehab Potential Good    PT Frequency 3x / week    PT Duration 6 weeks   increased time secondary to slow healing wound   PT Treatment/Interventions Gait training;Stair training;Functional mobility training;DME Instruction;Therapeutic activities;Therapeutic exercise;Balance training;Neuromuscular re-education;Patient/family education;Compression bandaging;Manual lymph drainage;Manual techniques    PT Next Visit Plan continue to debride PRN and change dressings to promote wound healing    Consulted and Agree with Plan of Care Patient;Family member/caregiver    Family Member Consulted wife           Patient will benefit from skilled therapeutic intervention in order to improve the following deficits and impairments:  Abnormal gait,Decreased skin integrity,Decreased mobility,Impaired sensation  Visit Diagnosis: Ulcer of right pretibial region, limited to breakdown of skin ([place])  Other abnormalities of gait and mobility     Problem List Patient Active Problem List   Diagnosis Date Noted  . Advanced nonexudative age-related macular degeneration of right eye without subfoveal involvement 11/15/2020  . Advanced nonexudative age-related macular degeneration of left eye without subfoveal involvement 11/15/2020  . Diabetes mellitus without complication ([place]) 96/75/9163  . Posterior vitreous detachment of both eyes 11/15/2020  . Gait disturbance, post-stroke 11/02/2017  . Aphasia as late effect of cerebrovascular accident 11/02/2017  . 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 Aphasia   . PAF (paroxysmal  atrial fibrillation) ([place])   . Diabetes mellitus type 2 in obese ([place])   . Benign essential HTN   . Hypothyroidism   . Hyperlipidemia   . Stroke (cerebrum) ([place]) 10/02/2017  . Afib ([place]) 01/10/2017  . SOB (shortness of breath) 01/10/2017  . Acute diastolic CHF (congestive heart failure) ([place]) 01/10/2017  . Atrial fibrillation with RVR ([place]) 01/10/2017  . Claudication ([place]) 07/25/2016  . Paroxysmal atrial fibrillation ([place]) 07/25/2016  . Odynophagia 04/10/2014  . Allergic rhinitis 03/23/2014  . Chronic rhinitis 12/22/2013  . 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 Constipation 08/22/2011  . Obesity 09/20/2009  . G E R D 11/29/2007  . Celiac disease 11/29/2007   12:15 PM, 01/17/21 Jerene Pitch, DPT Physical Therapy with [facility]  [phone] office  [facility] [address] [place], [place], [address] Phone: [phone]   Fax:  [phone]  Name: [name] MRN: [id number] Date of Birth: 1929-08-09

## 2021-01-17 NOTE — Telephone Encounter (Signed)
Called PCP and left message on receptionist and on MD's voicemail about increased warmth and pain surrounding wound and possible benefits for antibiotics and wound culture.  12:31 PM, 01/17/21 Jerene Pitch, DPT Physical Therapy with [facility]  [phone] office

## 2021-01-18 ENCOUNTER — Other Ambulatory Visit (HOSPITAL_COMMUNITY): Payer: Self-pay | Admitting: Internal Medicine

## 2021-01-18 ENCOUNTER — Telehealth (HOSPITAL_COMMUNITY): Payer: Self-pay | Admitting: Physical Therapy

## 2021-01-18 DIAGNOSIS — I87311 Chronic venous hypertension (idiopathic) with ulcer of right lower extremity: Secondary | ICD-10-CM

## 2021-01-18 NOTE — Telephone Encounter (Signed)
[name] from Dr. Juel Burrow office returned VM about Santyl and antibiotics, Dr. Nevada Crane out of office but [name] sent urgent message to MD about possible need for antibiotics and use of Santyl ointment going forward.   11:30 AM, 01/18/21 [name], DPT Physical Therapy with [facility]  [phone] office

## 2021-01-21 ENCOUNTER — Other Ambulatory Visit: Payer: Self-pay

## 2021-01-21 ENCOUNTER — Ambulatory Visit (HOSPITAL_COMMUNITY): Payer: PPO | Admitting: Physical Therapy

## 2021-01-21 DIAGNOSIS — R2689 Other abnormalities of gait and mobility: Secondary | ICD-10-CM

## 2021-01-21 DIAGNOSIS — L97811 Non-pressure chronic ulcer of other part of right lower leg limited to breakdown of skin: Secondary | ICD-10-CM | POA: Diagnosis not present

## 2021-01-21 NOTE — Therapy (Signed)
[place], [place], [address] Phone: [phone]   Fax:  [phone]  Wound Care Therapy  Patient Details  Name: [name] MRN: [id number] Date of Birth: 01/20/1929 Referring Provider (PT): [name]   Encounter Date: 01/21/2021   PT End of Session - 01/21/21 1553    Visit Number 15    Number of Visits 32    Date for PT Re-Evaluation 02/26/21    Authorization Type [employer] (no auth req, no visit limit)    Progress Note Due on Visit 20    PT Start Time 1141    PT Stop Time 1222    PT Time Calculation (min) 41 min    Activity Tolerance Patient tolerated treatment well    Behavior During Therapy [facility] for tasks assessed/performed           Past Medical History:  Diagnosis Date  . Allergic rhinitis   . Asthmatic bronchitis   . Atrial fibrillation ([place])   . Celiac disease   . Cellulitis   . CKD (chronic kidney disease)   . Diabetes ([place])   . Diverticulosis   . Fx. left wrist 1987  . Gastric polyps   . GERD (gastroesophageal reflux disease)   . History of pneumonia   . Hypertension   . Hypothyroidism   . Iron deficiency anemia   . Melanoma ([place])   . Neuropathy   . PAF (paroxysmal atrial fibrillation) ([place])   . Prostate cancer ([place])   . REM sleep behavior disorder   . RLS (restless legs syndrome)   . Sleep apnea   . Spasticity   . Stroke ([place]) 09/2017  . Tremor   . Type 2 diabetes mellitus ([place])     Past Surgical History:  Procedure Laterality Date  . APPENDECTOMY  1978  . CARPAL TUNNEL RELEASE Left   . CATARACT EXTRACTION  03/2011   bilateral   . CHOLECYSTECTOMY  2001  . COLONOSCOPY W/ BIOPSIES  04/27/2008   diverticulosis, duodenitis  . FOOT SURGERY    . HERNIA REPAIR  1994   bilateral  . KNEE SURGERY Bilateral    x 2  . LUNG SURGERY  2001   for infection  . PROSTATECTOMY  2002  . UPPER GASTROINTESTINAL ENDOSCOPY  04/27/2008   celiac disease, gastric polyps     There were no vitals filed for this visit.               Wound Therapy - 01/21/21 1550    Subjective Pt comes today with santyl and began taking an antibiotic on Friday    Patient and Family Stated Goals wound to heal    Date of Onset 09/06/20    Prior Treatments MD and self care    Evaluation and Treatment Procedures Explained to Patient/Family Yes    Evaluation and Treatment Procedures agreed to;Other (comment)    Wound Properties Date First Assessed: 12/04/20 Time First Assessed: 1545 Wound Type: Venous stasis ulcer Location: Leg Location Orientation: Right Present on Admission: Yes   Wound Image View All Images View Images    Dressing Type Compression wrap    Dressing Changed Changed    Dressing Status Old drainage    Dressing Change Frequency PRN    Site / Wound Assessment Dusky;Red;Pink;Yellow    % Wound base Red or Granulating 20%    % Wound base Yellow/Fibrinous Exudate 70%    % Wound base Black/Eschar 10%    Margins Attached  edges (approximated)    Drainage Amount Minimal    Drainage Description Serosanguineous    Treatment Cleansed;Debridement (Selective)    Selective Debridement - Location entire wound bed    Selective Debridement - Tools Used Forceps    Selective Debridement - Tissue Removed slough    Wound Therapy - Clinical Statement Wound overall improved with decrease in duskiness and noted granulating ring around approximating perimeter.  Able to scrape layer of slough from central portion.  Overall reduced erythema perimeter and all small draining areas are now healed.  Cleansed well and moisturized.  Applied santyl to wound and continued with profore lite.    Wound Therapy - Functional Problem List gait and balance    Factors Delaying/Impairing Wound Healing Diabetes Mellitus;Vascular compromise;Altered sensation    Wound Therapy - Frequency 3X / week    Wound Plan continue to debride, change dressings, and reduce LE edema to promote wound healing.   Possible ABI if continues to heal slowly or begins to lack in progress.    Dressing  santyl on gauze, vaseline, profore lite witn 1/4 " foam used at LE to assist in keeping bandage from slipping down.                     PT Short Term Goals - 01/03/21 1344      PT SHORT TERM GOAL #1   Title Patient will have at least 50% granualtion tissue.    Time 3    Period Weeks    Status On-going    Target Date 12/25/20             PT Long Term Goals - 01/03/21 1344      PT LONG TERM GOAL #1   Title Patient's wound will be healed to reduce the risk of infection.    Time 6    Period Weeks    Status On-going      PT LONG TERM GOAL #2   Title Patient will be able to verbalize completion of daily skin checks to reduce the risk of further wound development.    Time 6    Period Weeks    Status On-going                  Patient will benefit from skilled therapeutic intervention in order to improve the following deficits and impairments:     Visit Diagnosis: Other abnormalities of gait and mobility  Ulcer of right pretibial region, limited to breakdown of skin [facility])     Problem List Patient Active Problem List   Diagnosis Date Noted  . Advanced nonexudative age-related macular degeneration of right eye without subfoveal involvement 11/15/2020  . Advanced nonexudative age-related macular degeneration of left eye without subfoveal involvement 11/15/2020  . Diabetes mellitus without complication ([place]) 01/77/9390  . Posterior vitreous detachment of both eyes 11/15/2020  . Gait disturbance, post-stroke 11/02/2017  . Aphasia as late effect of cerebrovascular accident 11/02/2017  . 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 Aphasia   . PAF (paroxysmal atrial fibrillation) ([place])   . Diabetes mellitus type 2 in obese ([place])   . Benign essential HTN   . Hypothyroidism   . Hyperlipidemia   . Stroke (cerebrum) ([place]) 10/02/2017  . Afib ([place]) 01/10/2017  .  SOB (shortness of breath) 01/10/2017  . Acute diastolic CHF (congestive heart failure) ([place]) 01/10/2017  . Atrial fibrillation with RVR ([place]) 01/10/2017  . Claudication ([place]) 07/25/2016  . Paroxysmal atrial fibrillation (H[facility])  07/25/2016  . Odynophagia 04/10/2014  . Allergic rhinitis 03/23/2014  . Chronic rhinitis 12/22/2013  . 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 Constipation 08/22/2011  . Obesity 09/20/2009  . G E R D 11/29/2007  . Celiac disease 11/29/2007   Teena Irani, PTA/CLT [phone]  Teena Irani 01/21/2021, 3:55 PM  [place] [address] [place], [place], [address] Phone: [phone]   Fax:  [phone]  Name: [name] MRN: [id number] Date of Birth: Mar 12, 1929

## 2021-01-22 ENCOUNTER — Ambulatory Visit (HOSPITAL_COMMUNITY): Payer: PPO | Admitting: Physical Therapy

## 2021-01-22 DIAGNOSIS — D509 Iron deficiency anemia, unspecified: Secondary | ICD-10-CM | POA: Diagnosis not present

## 2021-01-22 DIAGNOSIS — L97811 Non-pressure chronic ulcer of other part of right lower leg limited to breakdown of skin: Secondary | ICD-10-CM

## 2021-01-22 DIAGNOSIS — D631 Anemia in chronic kidney disease: Secondary | ICD-10-CM | POA: Diagnosis not present

## 2021-01-22 DIAGNOSIS — R2689 Other abnormalities of gait and mobility: Secondary | ICD-10-CM

## 2021-01-22 DIAGNOSIS — E1122 Type 2 diabetes mellitus with diabetic chronic kidney disease: Secondary | ICD-10-CM | POA: Diagnosis not present

## 2021-01-22 DIAGNOSIS — E782 Mixed hyperlipidemia: Secondary | ICD-10-CM | POA: Diagnosis not present

## 2021-01-22 DIAGNOSIS — I110 Hypertensive heart disease with heart failure: Secondary | ICD-10-CM | POA: Diagnosis not present

## 2021-01-22 DIAGNOSIS — I872 Venous insufficiency (chronic) (peripheral): Secondary | ICD-10-CM | POA: Diagnosis not present

## 2021-01-22 DIAGNOSIS — E1140 Type 2 diabetes mellitus with diabetic neuropathy, unspecified: Secondary | ICD-10-CM | POA: Diagnosis not present

## 2021-01-22 DIAGNOSIS — I5032 Chronic diastolic (congestive) heart failure: Secondary | ICD-10-CM | POA: Diagnosis not present

## 2021-01-22 DIAGNOSIS — N1831 Chronic kidney disease, stage 3a: Secondary | ICD-10-CM | POA: Diagnosis not present

## 2021-01-22 DIAGNOSIS — N1832 Chronic kidney disease, stage 3b: Secondary | ICD-10-CM | POA: Diagnosis not present

## 2021-01-22 DIAGNOSIS — D649 Anemia, unspecified: Secondary | ICD-10-CM | POA: Diagnosis not present

## 2021-01-22 DIAGNOSIS — I129 Hypertensive chronic kidney disease with stage 1 through stage 4 chronic kidney disease, or unspecified chronic kidney disease: Secondary | ICD-10-CM | POA: Diagnosis not present

## 2021-01-22 NOTE — Therapy (Signed)
[place], [place], [address] Phone: [phone]   Fax:  [phone]  Wound Care Therapy  Patient Details  Name: [name] MRN: [id number] Date of Birth: 09-05-1929 Referring Provider (PT): [name]   Encounter Date: 01/22/2021   PT End of Session - 01/22/21 1717    Visit Number 16    Number of Visits 32    Date for PT Re-Evaluation 02/26/21    Authorization Type [employer] (no auth req, no visit limit)    Progress Note Due on Visit 20    PT Start Time 1535    PT Stop Time 1605    PT Time Calculation (min) 30 min    Activity Tolerance Patient tolerated treatment well    Behavior During Therapy [facility] for tasks assessed/performed           Past Medical History:  Diagnosis Date  . Allergic rhinitis   . Asthmatic bronchitis   . Atrial fibrillation ([place])   . Celiac disease   . Cellulitis   . CKD (chronic kidney disease)   . Diabetes ([place])   . Diverticulosis   . Fx. left wrist 1987  . Gastric polyps   . GERD (gastroesophageal reflux disease)   . History of pneumonia   . Hypertension   . Hypothyroidism   . Iron deficiency anemia   . Melanoma ([place])   . Neuropathy   . PAF (paroxysmal atrial fibrillation) ([place])   . Prostate cancer ([place])   . REM sleep behavior disorder   . RLS (restless legs syndrome)   . Sleep apnea   . Spasticity   . Stroke ([place]) 09/2017  . Tremor   . Type 2 diabetes mellitus ([place])     Past Surgical History:  Procedure Laterality Date  . APPENDECTOMY  1978  . CARPAL TUNNEL RELEASE Left   . CATARACT EXTRACTION  03/2011   bilateral   . CHOLECYSTECTOMY  2001  . COLONOSCOPY W/ BIOPSIES  04/27/2008   diverticulosis, duodenitis  . FOOT SURGERY    . HERNIA REPAIR  1994   bilateral  . KNEE SURGERY Bilateral    x 2  . LUNG SURGERY  2001   for infection  . PROSTATECTOMY  2002  . UPPER GASTROINTESTINAL ENDOSCOPY  04/27/2008   celiac disease, gastric polyps     There were no vitals filed for this visit.               Wound Therapy - 01/22/21 1707    Subjective Pt's wife came back to observe session and ask some quesions.  Pt reported no issues with new dressing.    Patient and Family Stated Goals wound to heal    Date of Onset 09/06/20    Prior Treatments MD and self care    Evaluation and Treatment Procedures Explained to Patient/Family Yes    Evaluation and Treatment Procedures agreed to;Other (comment)    Wound Properties Date First Assessed: 12/04/20 Time First Assessed: 1545 Wound Type: Venous stasis ulcer Location: Leg Location Orientation: Right Present on Admission: Yes   Dressing Type Compression wrap    Dressing Changed Changed    Dressing Status Old drainage    Dressing Change Frequency PRN    Site / Wound Assessment Red;Pink;Yellow    % Wound base Red or Granulating 20%    % Wound base Yellow/Fibrinous Exudate 70%    % Wound base Black/Eschar 10%    Peri-wound Assessment Erythema (blanchable)  Margins Attached edges (approximated)    Drainage Amount Minimal    Drainage Description Serosanguineous    Treatment Cleansed;Debridement (Selective)    Selective Debridement - Location entire wound bed    Selective Debridement - Tools Used Forceps    Selective Debridement - Tissue Removed slough    Wound Therapy - Clinical Statement Wife reports patient is having ABI study tomorrow.  Wound with less duskiness as last session.  Cleansed, debrided and redressed with santyl.  Did not use profore this session as he will need it removed for test tomorrow.  Educated to elevate LE as able to keep edema down.  Explained to patient and spouse how the ABI is tested, what it indicates and what to expect.    Wound Therapy - Functional Problem List gait and balance    Factors Delaying/Impairing Wound Healing Diabetes Mellitus;Vascular compromise;Altered sensation    Wound Therapy - Frequency 3X / week    Wound Plan continue to  debride, change dressings, and reduce LE edema to promote wound healing.  Review results of ABI When completed and change/continue compression as indicated.    Dressing  santyl on gauze, vaseline, kerlix around ankle only                     PT Short Term Goals - 01/03/21 1344      PT SHORT TERM GOAL #1   Title Patient will have at least 50% granualtion tissue.    Time 3    Period Weeks    Status On-going    Target Date 12/25/20             PT Long Term Goals - 01/03/21 1344      PT LONG TERM GOAL #1   Title Patient's wound will be healed to reduce the risk of infection.    Time 6    Period Weeks    Status On-going      PT LONG TERM GOAL #2   Title Patient will be able to verbalize completion of daily skin checks to reduce the risk of further wound development.    Time 6    Period Weeks    Status On-going                  Patient will benefit from skilled therapeutic intervention in order to improve the following deficits and impairments:     Visit Diagnosis: Ulcer of right pretibial region, limited to breakdown of skin ([facility])  Other abnormalities of gait and mobility     Problem List Patient Active Problem List   Diagnosis Date Noted  . Advanced nonexudative age-related macular degeneration of right eye without subfoveal involvement 11/15/2020  . Advanced nonexudative age-related macular degeneration of left eye without subfoveal involvement 11/15/2020  . Diabetes mellitus without complication ([place]) 19/50/9326  . Posterior vitreous detachment of both eyes 11/15/2020  . Gait disturbance, post-stroke 11/02/2017  . Aphasia as late effect of cerebrovascular accident 11/02/2017  . 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 Aphasia   . PAF (paroxysmal atrial fibrillation) ([place])   . Diabetes mellitus type 2 in obese ([place])   . Benign essential HTN   . Hypothyroidism   . Hyperlipidemia   . Stroke (cerebrum) ([place]) 10/02/2017  . Afib  ([place]) 01/10/2017  . SOB (shortness of breath) 01/10/2017  . Acute diastolic CHF (congestive heart failure) ([place]) 01/10/2017  . Atrial fibrillation with RVR ([place]) 01/10/2017  . Claudication ([place]) 07/25/2016  . Paroxysmal  atrial fibrillation ([place]) 07/25/2016  . Odynophagia 04/10/2014  . Allergic rhinitis 03/23/2014  . Chronic rhinitis 12/22/2013  . 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 Constipation 08/22/2011  . Obesity 09/20/2009  . G E R D 11/29/2007  . Celiac disease 11/29/2007   Teena Irani, PTA/CLT [phone]  Teena Irani 01/22/2021, 5:18 PM  [facility] [address] [place], [place], [address] Phone: [phone]   Fax:  [phone]  Name: [name] MRN: [id number] Date of Birth: 03-Apr-1929

## 2021-01-23 ENCOUNTER — Other Ambulatory Visit: Payer: Self-pay

## 2021-01-23 ENCOUNTER — Ambulatory Visit (HOSPITAL_COMMUNITY)
Admission: RE | Admit: 2021-01-23 | Discharge: 2021-01-23 | Disposition: A | Discharge status: Home / Self Care | Payer: PPO | Source: Ambulatory Visit | Attending: Internal Medicine | Admitting: Internal Medicine

## 2021-01-23 DIAGNOSIS — L97919 Non-pressure chronic ulcer of unspecified part of right lower leg with unspecified severity: Secondary | ICD-10-CM | POA: Insufficient documentation

## 2021-01-23 DIAGNOSIS — I87311 Chronic venous hypertension (idiopathic) with ulcer of right lower extremity: Secondary | ICD-10-CM | POA: Diagnosis not present

## 2021-01-24 ENCOUNTER — Ambulatory Visit (HOSPITAL_COMMUNITY): Payer: PPO | Admitting: Physical Therapy

## 2021-01-24 DIAGNOSIS — R2689 Other abnormalities of gait and mobility: Secondary | ICD-10-CM

## 2021-01-24 DIAGNOSIS — E875 Hyperkalemia: Secondary | ICD-10-CM | POA: Diagnosis not present

## 2021-01-24 DIAGNOSIS — E039 Hypothyroidism, unspecified: Secondary | ICD-10-CM | POA: Diagnosis not present

## 2021-01-24 DIAGNOSIS — E782 Mixed hyperlipidemia: Secondary | ICD-10-CM | POA: Diagnosis not present

## 2021-01-24 DIAGNOSIS — I4891 Unspecified atrial fibrillation: Secondary | ICD-10-CM | POA: Diagnosis not present

## 2021-01-24 DIAGNOSIS — K900 Celiac disease: Secondary | ICD-10-CM | POA: Diagnosis not present

## 2021-01-24 DIAGNOSIS — I129 Hypertensive chronic kidney disease with stage 1 through stage 4 chronic kidney disease, or unspecified chronic kidney disease: Secondary | ICD-10-CM | POA: Diagnosis not present

## 2021-01-24 DIAGNOSIS — I69351 Hemiplegia and hemiparesis following cerebral infarction affecting right dominant side: Secondary | ICD-10-CM | POA: Diagnosis not present

## 2021-01-24 DIAGNOSIS — D631 Anemia in chronic kidney disease: Secondary | ICD-10-CM | POA: Diagnosis not present

## 2021-01-24 DIAGNOSIS — L97811 Non-pressure chronic ulcer of other part of right lower leg limited to breakdown of skin: Secondary | ICD-10-CM

## 2021-01-24 DIAGNOSIS — F5101 Primary insomnia: Secondary | ICD-10-CM | POA: Diagnosis not present

## 2021-01-24 DIAGNOSIS — E1140 Type 2 diabetes mellitus with diabetic neuropathy, unspecified: Secondary | ICD-10-CM | POA: Diagnosis not present

## 2021-01-24 DIAGNOSIS — I10 Essential (primary) hypertension: Secondary | ICD-10-CM | POA: Diagnosis not present

## 2021-01-24 DIAGNOSIS — I639 Cerebral infarction, unspecified: Secondary | ICD-10-CM | POA: Diagnosis not present

## 2021-01-24 NOTE — Therapy (Signed)
[place], [place], [address] Phone: [phone]   Fax:  [phone]  Wound Care Therapy  Patient Details  Name: [name] MRN: [id number] Date of Birth: 06/15/1929 Referring Provider (PT): [name]   Encounter Date: 01/24/2021   PT End of Session - 01/24/21 1155    Visit Number 17    Number of Visits 32    Date for PT Re-Evaluation 02/26/21    Authorization Type [employer] (no auth req, no visit limit)    Progress Note Due on Visit 20    PT Start Time 1045    PT Stop Time 1120    PT Time Calculation (min) 35 min    Activity Tolerance Patient tolerated treatment well    Behavior During Therapy [facility] for tasks assessed/performed           Past Medical History:  Diagnosis Date  . Allergic rhinitis   . Asthmatic bronchitis   . Atrial fibrillation ([place])   . Celiac disease   . Cellulitis   . CKD (chronic kidney disease)   . Diabetes ([place])   . Diverticulosis   . Fx. left wrist 1987  . Gastric polyps   . GERD (gastroesophageal reflux disease)   . History of pneumonia   . Hypertension   . Hypothyroidism   . Iron deficiency anemia   . Melanoma ([place])   . Neuropathy   . PAF (paroxysmal atrial fibrillation) ([place])   . Prostate cancer ([place])   . REM sleep behavior disorder   . RLS (restless legs syndrome)   . Sleep apnea   . Spasticity   . Stroke ([place]) 09/2017  . Tremor   . Type 2 diabetes mellitus ([place])     Past Surgical History:  Procedure Laterality Date  . APPENDECTOMY  1978  . CARPAL TUNNEL RELEASE Left   . CATARACT EXTRACTION  03/2011   bilateral   . CHOLECYSTECTOMY  2001  . COLONOSCOPY W/ BIOPSIES  04/27/2008   diverticulosis, duodenitis  . FOOT SURGERY    . HERNIA REPAIR  1994   bilateral  . KNEE SURGERY Bilateral    x 2  . LUNG SURGERY  2001   for infection  . PROSTATECTOMY  2002  . UPPER GASTROINTESTINAL ENDOSCOPY  04/27/2008   celiac disease, gastric polyps     There were no vitals filed for this visit.               Wound Therapy - 01/24/21 0001    Subjective Pt with normal ABI test yesterday of Rt 1.07 and Lt 1.12.  Pt states his leg felt good without the wraps but his wife told him it was a little swollen.  Plans on obtaining juxtas from [facility].    Patient and Family Stated Goals wound to heal    Date of Onset 09/06/20    Prior Treatments MD and self care    Evaluation and Treatment Procedures Explained to Patient/Family Yes    Evaluation and Treatment Procedures agreed to;Other (comment)    Wound Properties Date First Assessed: 12/04/20 Time First Assessed: 1545 Wound Type: Venous stasis ulcer Location: Leg Location Orientation: Right Present on Admission: Yes   Wound Image View All Images View Images    Dressing Type Compression wrap    Dressing Changed Changed    Dressing Status Old drainage    Dressing Change Frequency PRN    Site / Wound Assessment Pink;Red;Yellow    %  Wound base Red or Granulating 25%    % Wound base Yellow/Fibrinous Exudate 65%    % Wound base Black/Eschar 10%    Peri-wound Assessment Erythema (blanchable)    Wound Length (cm) 3 cm    Wound Width (cm) 2.4 cm    Wound Depth (cm) 0.3 cm    Wound Volume (cm^3) 2.16 cm^3    Wound Surface Area (cm^2) 7.2 cm^2    Margins Attached edges (approximated)    Drainage Amount Scant    Drainage Description Serosanguineous    Treatment Cleansed;Debridement (Selective)    Selective Debridement - Location entire wound bed    Selective Debridement - Tools Used Forceps;Scissors    Selective Debridement - Tissue Removed slough    Wound Therapy - Clinical Statement Wound dressing adhered into woundbed with more dryness noted today.  Cleansed well and debrided via scapel and foreceps for perimeter dry ring of tissue.  Wound continues to approximate and granualtion present around the borders.  Dark area persists centrally with slough easily scraped away.  Added  hydrogel gauze to wound to help maintain  moisture.  REsumed compression using coban and 1/4" foam.  Kerlix and cotton around ankle also used.    Wound Therapy - Functional Problem List gait and balance    Factors Delaying/Impairing Wound Healing Diabetes Mellitus;Vascular compromise;Altered sensation    Wound Therapy - Frequency 3X / week    Wound Plan continue to debride, change dressings, and reduce LE edema to promote wound healing. Follow up with juxta as he can go ahead and get this and use over his wound dressings.    Dressing  santyl on gauze, hydrogel, vaseline, kerlix, cotton, 1/4" foam, coban and #5 netting                     PT Short Term Goals - 01/03/21 1344      PT SHORT TERM GOAL #1   Title Patient will have at least 50% granualtion tissue.    Time 3    Period Weeks    Status On-going    Target Date 12/25/20             PT Long Term Goals - 01/03/21 1344      PT LONG TERM GOAL #1   Title Patient's wound will be healed to reduce the risk of infection.    Time 6    Period Weeks    Status On-going      PT LONG TERM GOAL #2   Title Patient will be able to verbalize completion of daily skin checks to reduce the risk of further wound development.    Time 6    Period Weeks    Status On-going                  Patient will benefit from skilled therapeutic intervention in order to improve the following deficits and impairments:     Visit Diagnosis: Ulcer of right pretibial region, limited to breakdown of skin (HCC)  Other abnormalities of gait and mobility     Problem List Patient Active Problem List   Diagnosis Date Noted  . Advanced nonexudative age-related macular degeneration of right eye without subfoveal involvement 11/15/2020  . Advanced nonexudative age-related macular degeneration of left eye without subfoveal involvement 11/15/2020  . Diabetes mellitus without complication ([place]) 81/10/7508  . Posterior vitreous detachment of  both eyes 11/15/2020  . Gait disturbance, post-stroke 11/02/2017  . Aphasia as late effect of cerebrovascular accident  11/02/2017  . 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 Aphasia   . PAF (paroxysmal atrial fibrillation) ([place])   . Diabetes mellitus type 2 in obese ([place])   . Benign essential HTN   . Hypothyroidism   . Hyperlipidemia   . Stroke (cerebrum) ([place]) 10/02/2017  . Afib ([place]) 01/10/2017  . SOB (shortness of breath) 01/10/2017  . Acute diastolic CHF (congestive heart failure) ([place]) 01/10/2017  . Atrial fibrillation with RVR ([place]) 01/10/2017  . Claudication ([place]) 07/25/2016  . Paroxysmal atrial fibrillation ([place]) 07/25/2016  . Odynophagia 04/10/2014  . Allergic rhinitis 03/23/2014  . Chronic rhinitis 12/22/2013  . 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 Constipation 08/22/2011  . Obesity 09/20/2009  . G E R D 11/29/2007  . Celiac disease 11/29/2007   Teena Irani, PTA/CLT [phone]  Teena Irani 01/24/2021, 11:56 AM  [facility] [address] [place], [place], [address] Phone: [phone]   Fax:  [phone]  Name: [name] MRN: [id number] Date of Birth: [birth year]

## 2021-01-28 ENCOUNTER — Ambulatory Visit (HOSPITAL_COMMUNITY): Payer: PPO | Admitting: Physical Therapy

## 2021-01-28 ENCOUNTER — Other Ambulatory Visit: Payer: Self-pay

## 2021-01-28 DIAGNOSIS — L97811 Non-pressure chronic ulcer of other part of right lower leg limited to breakdown of skin: Secondary | ICD-10-CM

## 2021-01-28 DIAGNOSIS — R2689 Other abnormalities of gait and mobility: Secondary | ICD-10-CM

## 2021-01-28 NOTE — Therapy (Signed)
[place], [place], [address] Phone: [phone]   Fax:  [phone]  Wound Care Therapy  Patient Details  Name: [name] MRN: [id number] Date of Birth: 1929/07/25 Referring Provider (PT): [name]   Encounter Date: 01/28/2021   PT End of Session - 01/28/21 1539    Visit Number 18    Number of Visits 32    Date for PT Re-Evaluation 02/26/21    Authorization Type [employer] (no auth req, no visit limit)    Progress Note Due on Visit 20    PT Start Time 1450    PT Stop Time 1522    PT Time Calculation (min) 32 min    Activity Tolerance Patient tolerated treatment well    Behavior During Therapy [facility] for tasks assessed/performed           Past Medical History:  Diagnosis Date  . Allergic rhinitis   . Asthmatic bronchitis   . Atrial fibrillation ([place])   . Celiac disease   . Cellulitis   . CKD (chronic kidney disease)   . Diabetes ([place])   . Diverticulosis   . Fx. left wrist 1987  . Gastric polyps   . GERD (gastroesophageal reflux disease)   . History of pneumonia   . Hypertension   . Hypothyroidism   . Iron deficiency anemia   . Melanoma ([place])   . Neuropathy   . PAF (paroxysmal atrial fibrillation) ([place])   . Prostate cancer ([place])   . REM sleep behavior disorder   . RLS (restless legs syndrome)   . Sleep apnea   . Spasticity   . Stroke ([place]) 09/2017  . Tremor   . Type 2 diabetes mellitus ([place])     Past Surgical History:  Procedure Laterality Date  . APPENDECTOMY  1978  . CARPAL TUNNEL RELEASE Left   . CATARACT EXTRACTION  03/2011   bilateral   . CHOLECYSTECTOMY  2001  . COLONOSCOPY W/ BIOPSIES  04/27/2008   diverticulosis, duodenitis  . FOOT SURGERY    . HERNIA REPAIR  1994   bilateral  . KNEE SURGERY Bilateral    x 2  . LUNG SURGERY  2001   for infection  . PROSTATECTOMY  2002  . UPPER GASTROINTESTINAL ENDOSCOPY  04/27/2008   celiac disease, gastric polyps     There were no vitals filed for this visit.               Wound Therapy - 01/28/21 0001    Subjective pt states he is doing well with no pain or issues.    Patient and Family Stated Goals wound to heal    Date of Onset 09/06/20    Prior Treatments MD and self care    Evaluation and Treatment Procedures Explained to Patient/Family Yes    Evaluation and Treatment Procedures agreed to;Other (comment)    Wound Properties Date First Assessed: 12/04/20 Time First Assessed: 1545 Wound Type: Venous stasis ulcer Location: Leg Location Orientation: Right Present on Admission: Yes   Dressing Type Compression wrap;Hydrogel   santyl   Dressing Changed Changed    Dressing Status Old drainage    Dressing Change Frequency PRN    Site / Wound Assessment Calvin;Pink;Yellow    % Wound base Calvin or Granulating 25%    % Wound base Yellow/Fibrinous Exudate 75%    Peri-wound Assessment Erythema (blanchable)    Margins Unattached edges (unapproximated)   50% attached   Drainage Amount  Scant    Drainage Description Serosanguineous    Treatment Cleansed;Debridement (Selective)    Selective Debridement - Location entire wound bed    Selective Debridement - Tools Used Forceps;Scalpel    Selective Debridement - Tissue Removed slough    Wound Therapy - Clinical Statement much improved moisture making slough easier to debride.  No longer with duskiness or darker eschar.  Starting to shallow out the slough via debridement.   Cleansed and moisturized well prior to redressing with compression.  Santyl continued into woundbed along with hydrogel.    Wound Therapy - Functional Problem List gait and balance    Factors Delaying/Impairing Wound Healing Diabetes Mellitus;Vascular compromise;Altered sensation    Wound Therapy - Frequency 3X / week    Wound Plan continue to debride, change dressings, and reduce LE edema to promote wound healing. Follow up with juxta as he can go ahead and get this and use over his  wound dressings.    Dressing  santyl on gauze, hydrogel, vaseline, kerlix, cotton, 1/4" foam, coban and #5 netting                     PT Short Term Goals - 01/03/21 1344      PT SHORT TERM GOAL #1   Title Patient will have at least 50% granualtion tissue.    Time 3    Period Weeks    Status On-going    Target Date 12/25/20             PT Long Term Goals - 01/03/21 1344      PT LONG TERM GOAL #1   Title Patient's wound will be healed to reduce the risk of infection.    Time 6    Period Weeks    Status On-going      PT LONG TERM GOAL #2   Title Patient will be able to verbalize completion of daily skin checks to reduce the risk of further wound development.    Time 6    Period Weeks    Status On-going                  Patient will benefit from skilled therapeutic intervention in order to improve the following deficits and impairments:     Visit Diagnosis: Ulcer of right pretibial region, limited to breakdown of skin (HCC)  Other abnormalities of gait and mobility     Problem List Patient Active Problem List   Diagnosis Date Noted  . Advanced nonexudative age-related macular degeneration of right eye without subfoveal involvement 11/15/2020  . Advanced nonexudative age-related macular degeneration of left eye without subfoveal involvement 11/15/2020  . Diabetes mellitus without complication ([place]) 16/07/9603  . Posterior vitreous detachment of both eyes 11/15/2020  . Gait disturbance, post-stroke 11/02/2017  . Aphasia as late effect of cerebrovascular accident 11/02/2017  . 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 Aphasia   . PAF (paroxysmal atrial fibrillation) ([place])   . Diabetes mellitus type 2 in obese ([place])   . Benign essential HTN   . Hypothyroidism   . Hyperlipidemia   . Stroke (cerebrum) ([place]) 10/02/2017  . Afib ([place]) 01/10/2017  . SOB (shortness of breath) 01/10/2017  . Acute diastolic CHF (congestive heart  failure) ([place]) 01/10/2017  . Atrial fibrillation with RVR ([place]) 01/10/2017  . Claudication ([place]) 07/25/2016  . Paroxysmal atrial fibrillation ([place]) 07/25/2016  . Odynophagia 04/10/2014  . Allergic rhinitis 03/23/2014  . Chronic rhinitis 12/22/2013  . Intrinsic asthma  07/15/2013  . Constipation 08/22/2011  . Obesity 09/20/2009  . G E R D 11/29/2007  . Celiac disease 11/29/2007   Teena Irani, PTA/CLT [phone]  Teena Irani 01/28/2021, 3:39 PM  [facility] [address] [place], [place], [address] Phone: [phone]   Fax:  [phone]  Name: [name] MRN: [id number] Date of Birth: 05/08/1929

## 2021-01-29 ENCOUNTER — Ambulatory Visit (HOSPITAL_COMMUNITY): Payer: PPO | Admitting: Physical Therapy

## 2021-01-30 ENCOUNTER — Encounter (HOSPITAL_COMMUNITY): Payer: Self-pay | Admitting: Physical Therapy

## 2021-01-30 ENCOUNTER — Ambulatory Visit (HOSPITAL_COMMUNITY): Payer: PPO | Admitting: Physical Therapy

## 2021-01-30 ENCOUNTER — Other Ambulatory Visit: Payer: Self-pay

## 2021-01-30 DIAGNOSIS — L97811 Non-pressure chronic ulcer of other part of right lower leg limited to breakdown of skin: Secondary | ICD-10-CM | POA: Diagnosis not present

## 2021-01-30 DIAGNOSIS — R2689 Other abnormalities of gait and mobility: Secondary | ICD-10-CM

## 2021-01-30 NOTE — Therapy (Signed)
[place], [place], [address] Phone: [phone]   Fax:  [phone]  Wound Care Therapy  Patient Details  Name: [name] MRN: [id number] Date of Birth: May 21, 1929 Referring Provider (PT): [name]   Encounter Date: 01/30/2021   PT End of Session - 01/30/21 1213    Visit Number 19    Number of Visits 32    Date for PT Re-Evaluation 02/26/21    Authorization Type [employer] (no auth req, no visit limit)    Progress Note Due on Visit 20    PT Start Time 1130    PT Stop Time 1210    PT Time Calculation (min) 40 min    Activity Tolerance Patient tolerated treatment well    Behavior During Therapy [facility] for tasks assessed/performed           Past Medical History:  Diagnosis Date  . Allergic rhinitis   . Asthmatic bronchitis   . Atrial fibrillation ([place])   . Celiac disease   . Cellulitis   . CKD (chronic kidney disease)   . Diabetes ([place])   . Diverticulosis   . Fx. left wrist 1987  . Gastric polyps   . GERD (gastroesophageal reflux disease)   . History of pneumonia   . Hypertension   . Hypothyroidism   . Iron deficiency anemia   . Melanoma ([place])   . Neuropathy   . PAF (paroxysmal atrial fibrillation) ([place])   . Prostate cancer ([place])   . REM sleep behavior disorder   . RLS (restless legs syndrome)   . Sleep apnea   . Spasticity   . Stroke ([place]) 09/2017  . Tremor   . Type 2 diabetes mellitus ([place])     Past Surgical History:  Procedure Laterality Date  . APPENDECTOMY  1978  . CARPAL TUNNEL RELEASE Left   . CATARACT EXTRACTION  03/2011   bilateral   . CHOLECYSTECTOMY  2001  . COLONOSCOPY W/ BIOPSIES  04/27/2008   diverticulosis, duodenitis  . FOOT SURGERY    . HERNIA REPAIR  1994   bilateral  . KNEE SURGERY Bilateral    x 2  . LUNG SURGERY  2001   for infection  . PROSTATECTOMY  2002  . UPPER GASTROINTESTINAL ENDOSCOPY  04/27/2008   celiac disease, gastric polyps     There were no vitals filed for this visit.               Wound Therapy - 01/30/21 0001    Subjective PT states that his leg is feeling good today    Patient and Family Stated Goals wound to heal    Date of Onset 09/06/20    Prior Treatments MD and self care    Evaluation and Treatment Procedures Explained to Patient/Family Yes    Evaluation and Treatment Procedures agreed to;Other (comment)    Wound Properties Date First Assessed: 12/04/20 Time First Assessed: 1545 Wound Type: Venous stasis ulcer Location: Leg Location Orientation: Right Present on Admission: Yes   Wound Image View All Images --   see under media   Dressing Type Compression wrap    Dressing Changed Changed    Dressing Status Old drainage    Dressing Change Frequency PRN    Site / Wound Assessment Yellow;Pink    % Wound base Red or Granulating 25%    % Wound base Yellow/Fibrinous Exudate 75%   slough is thinning out   Peri-wound Assessment Erythema (blanchable)  Drainage Amount Scant    Drainage Description Serous    Treatment Cleansed;Debridement (Selective)    Selective Debridement - Location entire wound bed    Selective Debridement - Tools Used Forceps;Scalpel;Scissors    Selective Debridement - Tissue Removed slough    Wound Therapy - Clinical Statement PT slough is thinning out but still appears on approximately 75% of wound surface. Santyl appears to be loosening slough, but as this is occuring depth of wound is becoming more apparent.    Wound Therapy - Functional Problem List gait and balance    Factors Delaying/Impairing Wound Healing Diabetes Mellitus;Vascular compromise;Altered sensation    Wound Therapy - Frequency 3X / week    Wound Plan continue to debride, change dressings, and reduce LE edema to promote wound healing. Follow up with juxta as he can go ahead and get this and use over his wound dressings.    Dressing  santyl on gauze, hydrogel, vaseline, kerlix, cotton, 1/4" foam, coban  and #5 netting                     PT Short Term Goals - 01/03/21 1344      PT SHORT TERM GOAL #1   Title Patient will have at least 50% granualtion tissue.    Time 3    Period Weeks    Status On-going    Target Date 12/25/20             PT Long Term Goals - 01/03/21 1344      PT LONG TERM GOAL #1   Title Patient's wound will be healed to reduce the risk of infection.    Time 6    Period Weeks    Status On-going      PT LONG TERM GOAL #2   Title Patient will be able to verbalize completion of daily skin checks to reduce the risk of further wound development.    Time 6    Period Weeks    Status On-going                 Plan - 01/30/21 1214    Clinical Impression Statement see above    Personal Factors and Comorbidities Age;Comorbidity 3+;Past/Current Experience;Fitness;Time since onset of injury/illness/exacerbation    Comorbidities DM, CKD, neuorpathy, Hx CVA    Examination-Activity Limitations Locomotion Level;Transfers;Squat;Stand;Dressing    Examination-Participation Restrictions Community Activity    Stability/Clinical Decision Making Stable/Uncomplicated    Rehab Potential Good    PT Frequency 3x / week    PT Duration 6 weeks   increased time secondary to slow healing wound   PT Treatment/Interventions Gait training;Stair training;Functional mobility training;DME Instruction;Therapeutic activities;Therapeutic exercise;Balance training;Neuromuscular re-education;Patient/family education;Compression bandaging;Manual lymph drainage;Manual techniques    PT Next Visit Plan complete progress note    Consulted and Agree with Plan of Care Patient;Family member/caregiver    Family Member Consulted wife           Patient will benefit from skilled therapeutic intervention in order to improve the following deficits and impairments:  Abnormal gait,Decreased skin integrity,Decreased mobility,Impaired sensation  Visit Diagnosis: Ulcer of right pretibial  region, limited to breakdown of skin ([place])  Other abnormalities of gait and mobility     Problem List Patient Active Problem List   Diagnosis Date Noted  . Advanced nonexudative age-related macular degeneration of right eye without subfoveal involvement 11/15/2020  . Advanced nonexudative age-related macular degeneration of left eye without subfoveal involvement 11/15/2020  . Diabetes mellitus without complication ([place])  11/15/2020  . Posterior vitreous detachment of both eyes 11/15/2020  . Gait disturbance, post-stroke 11/02/2017  . Aphasia as late effect of cerebrovascular accident 11/02/2017  . 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 Aphasia   . PAF (paroxysmal atrial fibrillation) ([place])   . Diabetes mellitus type 2 in obese ([place])   . Benign essential HTN   . Hypothyroidism   . Hyperlipidemia   . Stroke (cerebrum) ([place]) 10/02/2017  . Afib ([place]) 01/10/2017  . SOB (shortness of breath) 01/10/2017  . Acute diastolic CHF (congestive heart failure) ([place]) 01/10/2017  . Atrial fibrillation with RVR ([place]) 01/10/2017  . Claudication ([place]) 07/25/2016  . Paroxysmal atrial fibrillation ([place]) 07/25/2016  . Odynophagia 04/10/2014  . Allergic rhinitis 03/23/2014  . Chronic rhinitis 12/22/2013  . 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 Constipation 08/22/2011  . Obesity 09/20/2009  . G E R D 11/29/2007  . Celiac disease 11/29/2007    Rayetta Humphrey, PT CLT [phone] 01/30/2021, 12:14 PM  [facility] [address] [place], [place], [address] Phone: [phone]   Fax:  [phone]  Name: [name] MRN: [id number] Date of Birth: [birth year]

## 2021-01-31 ENCOUNTER — Ambulatory Visit (HOSPITAL_COMMUNITY): Payer: PPO | Admitting: Physical Therapy

## 2021-02-01 ENCOUNTER — Ambulatory Visit (HOSPITAL_COMMUNITY): Payer: PPO | Admitting: Physical Therapy

## 2021-02-01 ENCOUNTER — Encounter (HOSPITAL_COMMUNITY): Payer: Self-pay | Admitting: Physical Therapy

## 2021-02-01 ENCOUNTER — Other Ambulatory Visit: Payer: Self-pay

## 2021-02-01 DIAGNOSIS — L97811 Non-pressure chronic ulcer of other part of right lower leg limited to breakdown of skin: Secondary | ICD-10-CM

## 2021-02-01 DIAGNOSIS — R2689 Other abnormalities of gait and mobility: Secondary | ICD-10-CM

## 2021-02-01 NOTE — Therapy (Signed)
[place], [place], [address] Phone: [phone]   Fax:  [phone]  Wound Care Therapy  Patient Details  Name: [name] MRN: [id number] Date of Birth: Mar 14, 1929 Referring Provider (PT): [name]  Progress Note Reporting Period 01/03/2021 to 02/01/2021  See note below for Objective Data and Assessment of Progress/Goals.       Encounter Date: 02/01/2021   PT End of Session - 02/01/21 1604    Visit Number 20    Number of Visits 32    Date for PT Re-Evaluation 02/26/21    Authorization Type [employer] (no auth req, no visit limit)    Progress Note Due on Visit 30    PT Start Time 1520    PT Stop Time 1555    PT Time Calculation (min) 35 min    Activity Tolerance Patient tolerated treatment well    Behavior During Therapy WFL for tasks assessed/performed           Past Medical History:  Diagnosis Date  . Allergic rhinitis   . Asthmatic bronchitis   . Atrial fibrillation ([place])   . Celiac disease   . Cellulitis   . CKD (chronic kidney disease)   . Diabetes ([place])   . Diverticulosis   . Fx. left wrist 1987  . Gastric polyps   . GERD (gastroesophageal reflux disease)   . History of pneumonia   . Hypertension   . Hypothyroidism   . Iron deficiency anemia   . Melanoma ([place])   . Neuropathy   . PAF (paroxysmal atrial fibrillation) ([place])   . Prostate cancer ([place])   . REM sleep behavior disorder   . RLS (restless legs syndrome)   . Sleep apnea   . Spasticity   . Stroke ([place]) 09/2017  . Tremor   . Type 2 diabetes mellitus ([place])     Past Surgical History:  Procedure Laterality Date  . APPENDECTOMY  1978  . CARPAL TUNNEL RELEASE Left   . CATARACT EXTRACTION  03/2011   bilateral   . CHOLECYSTECTOMY  2001  . COLONOSCOPY W/ BIOPSIES  04/27/2008   diverticulosis, duodenitis  . FOOT SURGERY    . HERNIA REPAIR  1994   bilateral  . KNEE SURGERY Bilateral    x 2  . LUNG SURGERY   2001   for infection  . PROSTATECTOMY  2002  . UPPER GASTROINTESTINAL ENDOSCOPY  04/27/2008   celiac disease, gastric polyps    There were no vitals filed for this visit.               Wound Therapy - 02/01/21 0001    Subjective Pt states that the only time he has pain is when we are working on it.    Patient and Family Stated Goals wound to heal    Date of Onset 09/06/20    Prior Treatments MD and self care    Evaluation and Treatment Procedures Explained to Patient/Family Yes    Evaluation and Treatment Procedures agreed to;Other (comment)    Wound Properties Date First Assessed: 12/04/20 Time First Assessed: 1545 Wound Type: Venous stasis ulcer Location: Leg Location Orientation: Right Present on Admission: Yes   Dressing Type Compression wrap;Santyl;Gauze (Comment)    Dressing Changed Changed    Dressing Status Old drainage    Dressing Change Frequency PRN    Site / Wound Assessment Yellow;Pink    % Wound base Red or Granulating 30%   was 30   %  Wound base Yellow/Fibrinous Exudate 70%   was 70   Peri-wound Assessment Erythema (blanchable)    Wound Length (cm) 3 cm   was 3   Wound Width (cm) 2.4 cm   was 2.4   Wound Depth (cm) 0.3 cm   was .3   Wound Volume (cm^3) 2.16 cm^3    Wound Surface Area (cm^2) 7.2 cm^2    Drainage Amount Minimal   was scant   Drainage Description Serous    Treatment Cleansed;Debridement (Selective)    Selective Debridement - Location entire wound bed    Selective Debridement - Tools Used Forceps;Scalpel    Selective Debridement - Tissue Removed slough    Wound Therapy - Clinical Statement Slough repains adherent but is thinning  with santyl and mechanical debridement.  Pt wound size and granulation has not changed but there is noted thinning of slough as you can now see the wound bed thru the slough where you were unable to see the wound bed at last progress report. Therapist scored entire wound bed prior to placing santyl in wound bed.  Pt  continues to need skilled PT to remove slough from wound bed to allow wound to heal.    Wound Therapy - Functional Problem List gait and balance    Factors Delaying/Impairing Wound Healing Diabetes Mellitus;Vascular compromise;Altered sensation    Wound Therapy - Frequency 3X / week    Wound Plan continue to debride, change dressings, and reduce LE edema to promote wound healing. Follow up with juxta as he can go ahead and get this and use over his wound dressings.    Dressing  santyl on gauze, hydrogel, vaseline, kerlix, cotton, 1/4" foam, coban and #5 netting                     PT Short Term Goals - 01/03/21 1344      PT SHORT TERM GOAL #1   Title Patient will have at least 50% granualtion tissue.    Time 3    Period Weeks    Status On-going    Target Date 12/25/20             PT Long Term Goals - 01/03/21 1344      PT LONG TERM GOAL #1   Title Patient's wound will be healed to reduce the risk of infection.    Time 6    Period Weeks    Status On-going      PT LONG TERM GOAL #2   Title Patient will be able to verbalize completion of daily skin checks to reduce the risk of further wound development.    Time 6    Period Weeks    Status On-going                 Plan - 02/01/21 1605    Clinical Impression Statement see above    Personal Factors and Comorbidities Age;Comorbidity 3+;Past/Current Experience;Fitness;Time since onset of injury/illness/exacerbation    Comorbidities DM, CKD, neuorpathy, Hx CVA    Examination-Activity Limitations Locomotion Level;Transfers;Squat;Stand;Dressing    Examination-Participation Restrictions Community Activity    Stability/Clinical Decision Making Stable/Uncomplicated    Rehab Potential Good    PT Frequency 3x / week    PT Duration 6 weeks   increased time secondary to slow healing wound   PT Treatment/Interventions Gait training;Stair training;Functional mobility training;DME Instruction;Therapeutic  activities;Therapeutic exercise;Balance training;Neuromuscular re-education;Patient/family education;Compression bandaging;Manual lymph drainage;Manual techniques    PT Next Visit Plan Continue with both chemical and  mechanical debridement to remove adherent eschar to allow healing to occure    Consulted and Agree with Plan of Care Patient;Family member/caregiver    Family Member Consulted wife           Patient will benefit from skilled therapeutic intervention in order to improve the following deficits and impairments:  Abnormal gait,Decreased skin integrity,Decreased mobility,Impaired sensation  Visit Diagnosis: Ulcer of right pretibial region, limited to breakdown of skin ([place])  Other abnormalities of gait and mobility     Problem List Patient Active Problem List   Diagnosis Date Noted  . Advanced nonexudative age-related macular degeneration of right eye without subfoveal involvement 11/15/2020  . Advanced nonexudative age-related macular degeneration of left eye without subfoveal involvement 11/15/2020  . Diabetes mellitus without complication ([place]) 04/05/7627  . Posterior vitreous detachment of both eyes 11/15/2020  . Gait disturbance, post-stroke 11/02/2017  . Aphasia as late effect of cerebrovascular accident 11/02/2017  . 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 Aphasia   . PAF (paroxysmal atrial fibrillation) ([place])   . Diabetes mellitus type 2 in obese ([place])   . Benign essential HTN   . Hypothyroidism   . Hyperlipidemia   . Stroke (cerebrum) ([place]) 10/02/2017  . Afib ([place]) 01/10/2017  . SOB (shortness of breath) 01/10/2017  . Acute diastolic CHF (congestive heart failure) ([place]) 01/10/2017  . Atrial fibrillation with RVR ([place]) 01/10/2017  . Claudication ([place]) 07/25/2016  . Paroxysmal atrial fibrillation ([place]) 07/25/2016  . Odynophagia 04/10/2014  . Allergic rhinitis 03/23/2014  . Chronic rhinitis 12/22/2013  . 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  Constipation 08/22/2011  . Obesity 09/20/2009  . G E R D 11/29/2007  . Celiac disease 11/29/2007    Rayetta Humphrey, PT CLT [phone] 02/01/2021, 4:07 PM  [place] [address] [place], [place], [address] Phone: [phone]   Fax:  [phone]  Name: DIVANTE KOTCH MRN: [id number] Date of Birth: May 28, 1929

## 2021-02-04 ENCOUNTER — Ambulatory Visit (HOSPITAL_COMMUNITY): Payer: PPO | Admitting: Physical Therapy

## 2021-02-04 ENCOUNTER — Other Ambulatory Visit: Payer: Self-pay

## 2021-02-04 DIAGNOSIS — L97811 Non-pressure chronic ulcer of other part of right lower leg limited to breakdown of skin: Secondary | ICD-10-CM

## 2021-02-04 DIAGNOSIS — R600 Localized edema: Secondary | ICD-10-CM | POA: Diagnosis not present

## 2021-02-04 DIAGNOSIS — E1122 Type 2 diabetes mellitus with diabetic chronic kidney disease: Secondary | ICD-10-CM | POA: Diagnosis not present

## 2021-02-04 DIAGNOSIS — I10 Essential (primary) hypertension: Secondary | ICD-10-CM | POA: Diagnosis not present

## 2021-02-04 DIAGNOSIS — N189 Chronic kidney disease, unspecified: Secondary | ICD-10-CM | POA: Diagnosis not present

## 2021-02-04 DIAGNOSIS — R2689 Other abnormalities of gait and mobility: Secondary | ICD-10-CM

## 2021-02-04 DIAGNOSIS — E211 Secondary hyperparathyroidism, not elsewhere classified: Secondary | ICD-10-CM | POA: Diagnosis not present

## 2021-02-04 DIAGNOSIS — D638 Anemia in other chronic diseases classified elsewhere: Secondary | ICD-10-CM | POA: Diagnosis not present

## 2021-02-04 NOTE — Therapy (Signed)
[place], [place], [address] Phone: [phone]   Fax:  [phone]  Wound Care Therapy  Patient Details  Name: [name] MRN: [id number] Date of Birth: Jun 06, 1929 Referring Provider (PT): [name]   Encounter Date: 02/04/2021   PT End of Session - 02/04/21 1607    Visit Number 21    Number of Visits 32    Date for PT Re-Evaluation 02/26/21    Authorization Type [employer] (no auth req, no visit limit)    Progress Note Due on Visit 30    PT Start Time 1530    PT Stop Time 1606    PT Time Calculation (min) 36 min    Activity Tolerance Patient tolerated treatment well    Behavior During Therapy [facility] for tasks assessed/performed           Past Medical History:  Diagnosis Date  . Allergic rhinitis   . Asthmatic bronchitis   . Atrial fibrillation ([place])   . Celiac disease   . Cellulitis   . CKD (chronic kidney disease)   . Diabetes ([place])   . Diverticulosis   . Fx. left wrist 1987  . Gastric polyps   . GERD (gastroesophageal reflux disease)   . History of pneumonia   . Hypertension   . Hypothyroidism   . Iron deficiency anemia   . Melanoma ([place])   . Neuropathy   . PAF (paroxysmal atrial fibrillation) ([place])   . Prostate cancer ([place])   . REM sleep behavior disorder   . RLS (restless legs syndrome)   . Sleep apnea   . Spasticity   . Stroke ([place]) 09/2017  . Tremor   . Type 2 diabetes mellitus ([place])     Past Surgical History:  Procedure Laterality Date  . APPENDECTOMY  1978  . CARPAL TUNNEL RELEASE Left   . CATARACT EXTRACTION  03/2011   bilateral   . CHOLECYSTECTOMY  2001  . COLONOSCOPY W/ BIOPSIES  04/27/2008   diverticulosis, duodenitis  . FOOT SURGERY    . HERNIA REPAIR  1994   bilateral  . KNEE SURGERY Bilateral    x 2  . LUNG SURGERY  2001   for infection  . PROSTATECTOMY  2002  . UPPER GASTROINTESTINAL ENDOSCOPY  04/27/2008   celiac disease, gastric polyps     There were no vitals filed for this visit.      [facility] PT Assessment - 02/04/21 0001      Assessment   Medical Diagnosis Chronic venous stasis wound                   Wound Therapy - 02/04/21 0001    Subjective States he has been having pain 7/10 at night. States that current pain is 3/10.    Patient and Family Stated Goals wound to heal    Date of Onset 09/06/20    Prior Treatments MD and self care    Evaluation and Treatment Procedures Explained to Patient/Family Yes    Evaluation and Treatment Procedures agreed to;Other (comment)    Wound Properties Date First Assessed: 12/04/20 Time First Assessed: 1545 Wound Type: Venous stasis ulcer Location: Leg Location Orientation: Right Present on Admission: Yes   Dressing Type Compression wrap;Hydrogel   santyl on gauze   Dressing Changed Changed    Dressing Status Old drainage    Dressing Change Frequency PRN    Site / Wound Assessment Yellow;Pink    % Wound base  Red or Granulating 25%    % Wound base Yellow/Fibrinous Exudate 75%   after debridment   Peri-wound Assessment Erythema (blanchable);Edema    Drainage Amount Moderate    Drainage Description Serosanguineous    Treatment Cleansed;Debridement (Selective)    Selective Debridement - Location entire wound bed    Selective Debridement - Tools Used Forceps    Selective Debridement - Tissue Removed slough    Wound Therapy - Clinical Statement Wound continues to start with about 95% slough and then able to debride to 75% slough. Pain throughout session. Continued crosshatching of wound bed. No weeping noted during session. Increased edema throughout leg. Continued with santyl and hydrogel, changed back to profore lite secondary to swelling. Redness continues but not expanding. Will continue with current POC as tolerated.    Wound Therapy - Functional Problem List gait and balance    Factors Delaying/Impairing Wound Healing Diabetes Mellitus;Vascular compromise;Altered  sensation    Wound Therapy - Frequency 3X / week    Wound Plan continue to debride, change dressings, and reduce LE edema to promote wound healing. Follow up with juxta as he can go ahead and get this and use over his wound dressings.    Dressing  santyl on gauze, hydrogel, vaseline,profore lite, 1/4" foam, coban and #5 netting                     PT Short Term Goals - 01/03/21 1344      PT SHORT TERM GOAL #1   Title Patient will have at least 50% granualtion tissue.    Time 3    Period Weeks    Status On-going    Target Date 12/25/20             PT Long Term Goals - 01/03/21 1344      PT LONG TERM GOAL #1   Title Patient's wound will be healed to reduce the risk of infection.    Time 6    Period Weeks    Status On-going      PT LONG TERM GOAL #2   Title Patient will be able to verbalize completion of daily skin checks to reduce the risk of further wound development.    Time 6    Period Weeks    Status On-going                 Plan - 02/04/21 1608    Clinical Impression Statement see above    Personal Factors and Comorbidities Age;Comorbidity 3+;Past/Current Experience;Fitness;Time since onset of injury/illness/exacerbation    Comorbidities DM, CKD, neuorpathy, Hx CVA    Examination-Activity Limitations Locomotion Level;Transfers;Squat;Stand;Dressing    Examination-Participation Restrictions Community Activity    Stability/Clinical Decision Making Stable/Uncomplicated    Rehab Potential Good    PT Frequency 3x / week    PT Duration 6 weeks   increased time secondary to slow healing wound   PT Treatment/Interventions Gait training;Stair training;Functional mobility training;DME Instruction;Therapeutic activities;Therapeutic exercise;Balance training;Neuromuscular re-education;Patient/family education;Compression bandaging;Manual lymph drainage;Manual techniques    PT Next Visit Plan Continue with both chemical and mechanical debridement to remove  adherent eschar to allow healing to occure    Consulted and Agree with Plan of Care Patient;Family member/caregiver    Family Member Consulted wife           Patient will benefit from skilled therapeutic intervention in order to improve the following deficits and impairments:  Abnormal gait,Decreased skin integrity,Decreased mobility,Impaired sensation  Visit Diagnosis: Ulcer of right pretibial  region, limited to breakdown of skin ([place])  Other abnormalities of gait and mobility     Problem List Patient Active Problem List   Diagnosis Date Noted  . Advanced nonexudative age-related macular degeneration of right eye without subfoveal involvement 11/15/2020  . Advanced nonexudative age-related macular degeneration of left eye without subfoveal involvement 11/15/2020  . Diabetes mellitus without complication ([place]) 62/44/6950  . Posterior vitreous detachment of both eyes 11/15/2020  . Gait disturbance, post-stroke 11/02/2017  . Aphasia as late effect of cerebrovascular accident 11/02/2017  . 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 Aphasia   . PAF (paroxysmal atrial fibrillation) ([place])   . Diabetes mellitus type 2 in obese ([place])   . Benign essential HTN   . Hypothyroidism   . Hyperlipidemia   . Stroke (cerebrum) ([place]) 10/02/2017  . Afib ([place]) 01/10/2017  . SOB (shortness of breath) 01/10/2017  . Acute diastolic CHF (congestive heart failure) ([place]) 01/10/2017  . Atrial fibrillation with RVR ([place]) 01/10/2017  . Claudication ([place]) 07/25/2016  . Paroxysmal atrial fibrillation ([place]) 07/25/2016  . Odynophagia 04/10/2014  . Allergic rhinitis 03/23/2014  . Chronic rhinitis 12/22/2013  . 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 Constipation 08/22/2011  . Obesity 09/20/2009  . G E R D 11/29/2007  . Celiac disease 11/29/2007    4:13 PM, 02/04/21 Jerene Pitch, DPT Physical Therapy with [facility]  [phone] office  [place] [address] [place], [place], [address] Phone: [phone]   Fax:  [phone]  Name: [name] MRN: [id number] Date of Birth: November 22, 1928

## 2021-02-05 ENCOUNTER — Ambulatory Visit (HOSPITAL_COMMUNITY): Payer: PPO | Admitting: Physical Therapy

## 2021-02-06 ENCOUNTER — Other Ambulatory Visit: Payer: Self-pay

## 2021-02-06 ENCOUNTER — Ambulatory Visit (HOSPITAL_COMMUNITY): Payer: PPO | Admitting: Physical Therapy

## 2021-02-06 ENCOUNTER — Encounter (HOSPITAL_COMMUNITY): Payer: Self-pay | Admitting: Physical Therapy

## 2021-02-06 DIAGNOSIS — R2689 Other abnormalities of gait and mobility: Secondary | ICD-10-CM

## 2021-02-06 DIAGNOSIS — L97811 Non-pressure chronic ulcer of other part of right lower leg limited to breakdown of skin: Secondary | ICD-10-CM | POA: Diagnosis not present

## 2021-02-06 NOTE — Therapy (Signed)
[name], [place], [address] Phone: [phone]   Fax:  [phone]  Wound Care Therapy  Patient Details  Name: [name] MRN: [id number] Date of Birth: October 12, 1929 Referring Provider (PT): [name]   Encounter Date: 02/06/2021   PT End of Session - 02/06/21 1531    Visit Number 22    Number of Visits 32    Date for PT Re-Evaluation 02/26/21    Authorization Type [employer] (no auth req, no visit limit)    Progress Note Due on Visit 30    PT Start Time 1445    PT Stop Time 1525    PT Time Calculation (min) 40 min    Activity Tolerance Patient tolerated treatment well    Behavior During Therapy [facility] for tasks assessed/performed           Past Medical History:  Diagnosis Date  . Allergic rhinitis   . Asthmatic bronchitis   . Atrial fibrillation ([place])   . Celiac disease   . Cellulitis   . CKD (chronic kidney disease)   . Diabetes ([place])   . Diverticulosis   . Fx. left wrist 1987  . Gastric polyps   . GERD (gastroesophageal reflux disease)   . History of pneumonia   . Hypertension   . Hypothyroidism   . Iron deficiency anemia   . Melanoma ([place])   . Neuropathy   . PAF (paroxysmal atrial fibrillation) ([place])   . Prostate cancer ([place])   . REM sleep behavior disorder   . RLS (restless legs syndrome)   . Sleep apnea   . Spasticity   . Stroke ([place]) 09/2017  . Tremor   . Type 2 diabetes mellitus ([place])     Past Surgical History:  Procedure Laterality Date  . APPENDECTOMY  1978  . CARPAL TUNNEL RELEASE Left   . CATARACT EXTRACTION  03/2011   bilateral   . CHOLECYSTECTOMY  2001  . COLONOSCOPY W/ BIOPSIES  04/27/2008   diverticulosis, duodenitis  . FOOT SURGERY    . HERNIA REPAIR  1994   bilateral  . KNEE SURGERY Bilateral    x 2  . LUNG SURGERY  2001   for infection  . PROSTATECTOMY  2002  . UPPER GASTROINTESTINAL ENDOSCOPY  04/27/2008   celiac disease, gastric polyps     There were no vitals filed for this visit.      [facility] PT Assessment - 02/06/21 0001      Assessment   Medical Diagnosis Chronic venous stasis wound                   Wound Therapy - 02/06/21 0001    Subjective States pain is about the same not getting better or worse    Patient and Family Stated Goals wound to heal    Date of Onset 09/06/20    Prior Treatments MD and self care    Evaluation and Treatment Procedures Explained to Patient/Family Yes    Evaluation and Treatment Procedures agreed to;Other (comment)    Wound Properties Date First Assessed: 12/04/20 Time First Assessed: 1545 Wound Type: Venous stasis ulcer Location: Leg Location Orientation: Right Present on Admission: Yes   Wound Image View All Images View Images    Dressing Type Compression wrap   santyl on gauze and hydrogel   Dressing Changed Changed    Dressing Status Old drainage    Dressing Change Frequency PRN    Site / Wound  Assessment Pink;Yellow    % Wound base Red or Granulating 35%    % Wound base Yellow/Fibrinous Exudate 65%   after debridement   Peri-wound Assessment Erythema (blanchable);Edema    Drainage Amount Minimal    Drainage Description Serosanguineous    Treatment Cleansed;Debridement (Selective)    Selective Debridement - Location entire wound bed    Selective Debridement - Tools Used Forceps    Selective Debridement - Tissue Removed slough    Wound Therapy - Clinical Statement Continued with heavy debridement of wound, improved granulation noted after debridement of slough but areas still with very adherent slough. Cross hatched as much of this area as tolerated. Continued with santyl on gauze and hydrogel followed by profore lite and foam. To note, leg color looks less red around shin, took a picture to monitor any changes with this. Will continue with current POC as tolerated.    Wound Therapy - Functional Problem List gait and balance    Factors Delaying/Impairing Wound Healing  Diabetes Mellitus;Vascular compromise;Altered sensation    Wound Therapy - Frequency 3X / week    Wound Plan continue to debride, change dressings, and reduce LE edema to promote wound healing. Follow up with juxta as he can go ahead and get this and use over his wound dressings.    Dressing  santyl on gauze, hydrogel, vaseline,profore lite, 1/4" foam, coban and #5 netting                     PT Short Term Goals - 01/03/21 1344      PT SHORT TERM GOAL #1   Title Patient will have at least 50% granualtion tissue.    Time 3    Period Weeks    Status On-going    Target Date 12/25/20             PT Long Term Goals - 01/03/21 1344      PT LONG TERM GOAL #1   Title Patient's wound will be healed to reduce the risk of infection.    Time 6    Period Weeks    Status On-going      PT LONG TERM GOAL #2   Title Patient will be able to verbalize completion of daily skin checks to reduce the risk of further wound development.    Time 6    Period Weeks    Status On-going                  Patient will benefit from skilled therapeutic intervention in order to improve the following deficits and impairments:     Visit Diagnosis: Ulcer of right pretibial region, limited to breakdown of skin ([facility])  Other abnormalities of gait and mobility     Problem List Patient Active Problem List   Diagnosis Date Noted  . Advanced nonexudative age-related macular degeneration of right eye without subfoveal involvement 11/15/2020  . Advanced nonexudative age-related macular degeneration of left eye without subfoveal involvement 11/15/2020  . Diabetes mellitus without complication ([place]) 79/89/2119  . Posterior vitreous detachment of both eyes 11/15/2020  . Gait disturbance, post-stroke 11/02/2017  . Aphasia as late effect of cerebrovascular accident 11/02/2017  . 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 Aphasia   . PAF (paroxysmal atrial fibrillation) ([place])   .  Diabetes mellitus type 2 in obese ([place])   . Benign essential HTN   . Hypothyroidism   . Hyperlipidemia   . Stroke (cerebrum) ([place]) 10/02/2017  .  Afib ([place]) 01/10/2017  . SOB (shortness of breath) 01/10/2017  . Acute diastolic CHF (congestive heart failure) ([place]) 01/10/2017  . Atrial fibrillation with RVR ([place]) 01/10/2017  . Claudication ([place]) 07/25/2016  . Paroxysmal atrial fibrillation ([place]) 07/25/2016  . Odynophagia 04/10/2014  . Allergic rhinitis 03/23/2014  . Chronic rhinitis 12/22/2013  . 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 Constipation 08/22/2011  . Obesity 09/20/2009  . G E R D 11/29/2007  . Celiac disease 11/29/2007   5:14 PM, 02/06/21 Jerene Pitch, DPT Physical Therapy with [facility]  [phone] office  [place] [address] [place], [place], [address] Phone: [phone]   Fax:  [phone]  Name: MALLIE LINNEMANN MRN: [id number] Date of Birth: [birth year]

## 2021-02-07 ENCOUNTER — Ambulatory Visit (HOSPITAL_COMMUNITY): Payer: PPO | Admitting: Physical Therapy

## 2021-02-07 ENCOUNTER — Encounter (HOSPITAL_COMMUNITY): Payer: Self-pay | Admitting: Physical Therapy

## 2021-02-07 DIAGNOSIS — L97811 Non-pressure chronic ulcer of other part of right lower leg limited to breakdown of skin: Secondary | ICD-10-CM

## 2021-02-07 DIAGNOSIS — R2689 Other abnormalities of gait and mobility: Secondary | ICD-10-CM

## 2021-02-07 NOTE — Therapy (Signed)
[place], [place], [address] Phone: [phone]   Fax:  [phone]  Wound Care Therapy  Patient Details  Name: [name] MRN: [id number] Date of Birth: 1928-12-10 Referring Provider (PT): [name]   Encounter Date: 02/07/2021   PT End of Session - 02/07/21 1407    Visit Number 23    Number of Visits 32    Date for PT Re-Evaluation 02/26/21    Authorization Type [employer] (no auth req, no visit limit)    Progress Note Due on Visit 30    PT Start Time 1316    PT Stop Time 1358    PT Time Calculation (min) 42 min    Activity Tolerance Patient tolerated treatment well    Behavior During Therapy [facility] for tasks assessed/performed           Past Medical History:  Diagnosis Date  . Allergic rhinitis   . Asthmatic bronchitis   . Atrial fibrillation ([place])   . Celiac disease   . Cellulitis   . CKD (chronic kidney disease)   . Diabetes ([place])   . Diverticulosis   . Fx. left wrist 1987  . Gastric polyps   . GERD (gastroesophageal reflux disease)   . History of pneumonia   . Hypertension   . Hypothyroidism   . Iron deficiency anemia   . Melanoma ([place])   . Neuropathy   . PAF (paroxysmal atrial fibrillation) ([place])   . Prostate cancer ([place])   . REM sleep behavior disorder   . RLS (restless legs syndrome)   . Sleep apnea   . Spasticity   . Stroke ([place]) 09/2017  . Tremor   . Type 2 diabetes mellitus ([place])     Past Surgical History:  Procedure Laterality Date  . APPENDECTOMY  1978  . CARPAL TUNNEL RELEASE Left   . CATARACT EXTRACTION  03/2011   bilateral   . CHOLECYSTECTOMY  2001  . COLONOSCOPY W/ BIOPSIES  04/27/2008   diverticulosis, duodenitis  . FOOT SURGERY    . HERNIA REPAIR  1994   bilateral  . KNEE SURGERY Bilateral    x 2  . LUNG SURGERY  2001   for infection  . PROSTATECTOMY  2002  . UPPER GASTROINTESTINAL ENDOSCOPY  04/27/2008   celiac disease, gastric polyps     There were no vitals filed for this visit.      [facility] PT Assessment - 02/07/21 0001      Assessment   Medical Diagnosis Chronic venous stasis wound    Referring Provider (PT) [name]                   Wound Therapy - 02/07/21 0001    Subjective States it has not been hurting as bad    Patient and Family Stated Goals wound to heal    Date of Onset 09/06/20    Prior Treatments MD and self care    Evaluation and Treatment Procedures Explained to Patient/Family Yes    Evaluation and Treatment Procedures agreed to;Other (comment)    Wound Properties Date First Assessed: 12/04/20 Time First Assessed: 1545 Wound Type: Venous stasis ulcer Location: Leg Location Orientation: Right Present on Admission: Yes   Dressing Type Compression wrap   santyl on guuze and hydrogel   Dressing Changed Changed    Dressing Status Old drainage    Dressing Change Frequency PRN    Site / Wound Assessment Pink;Yellow    %  Wound base Red or Granulating 35%    % Wound base Yellow/Fibrinous Exudate 65%    Peri-wound Assessment Erythema (blanchable);Edema;Hemosiderin   rash along shin   Wound Length (cm) 3.2 cm   was 3   Wound Width (cm) 2.6 cm   was 2.4   Wound Depth (cm) 0.3 cm   was .3   Wound Volume (cm^3) 2.5 cm^3    Wound Surface Area (cm^2) 8.32 cm^2    Drainage Amount Minimal    Drainage Description Serosanguineous    Treatment Cleansed;Debridement (Selective)    Selective Debridement - Location entire wound bed    Selective Debridement - Tools Used Forceps    Selective Debridement - Tissue Removed slough    Wound Therapy - Clinical Statement Rash noted upon right anterior shin that looked like skin got irritated by cotton layer. Less pain with palpation around periwound area today. Able to debride but still pain limiting. Granulation tissue budding through adherent slough, continued to crosshatched and scrap at slough which was most effective at removing lose slough. Added lymph  netting #5 to protect skin from cotton and reduce irritation, also applied Vaseline over entire leg. Layered cotton, kerlix and light coban secondary to cold toes and slight bluish color on one toe. Instructed wife to inspect toes daily and if blue increases to remove outermost layer (tan coban). Will trial no coban next session if circulation seems to be issue (despite normal ABI). Continued with Santyl and hydrogel.    Wound Therapy - Functional Problem List gait and balance    Factors Delaying/Impairing Wound Healing Diabetes Mellitus;Vascular compromise;Altered sensation    Wound Therapy - Frequency 3X / week    Wound Plan continue to debride, change dressings, and reduce LE edema to promote wound healing. Follow up with juxta as he can go ahead and get this and use over his wound dressings.    Dressing  santyl on gauze, hydrogel, vaseline,lymph netting #5, cotton, kerlix and light coban and #5 netting                     PT Short Term Goals - 01/03/21 1344      PT SHORT TERM GOAL #1   Title Patient will have at least 50% granualtion tissue.    Time 3    Period Weeks    Status On-going    Target Date 12/25/20             PT Long Term Goals - 01/03/21 1344      PT LONG TERM GOAL #1   Title Patient's wound will be healed to reduce the risk of infection.    Time 6    Period Weeks    Status On-going      PT LONG TERM GOAL #2   Title Patient will be able to verbalize completion of daily skin checks to reduce the risk of further wound development.    Time 6    Period Weeks    Status On-going                 Plan - 02/07/21 1407    Clinical Impression Statement see above    Personal Factors and Comorbidities Age;Comorbidity 3+;Past/Current Experience;Fitness;Time since onset of injury/illness/exacerbation    Comorbidities DM, CKD, neuorpathy, Hx CVA    Examination-Activity Limitations Locomotion Level;Transfers;Squat;Stand;Dressing     Examination-Participation Restrictions Community Activity    Stability/Clinical Decision Making Stable/Uncomplicated    Rehab Potential Good    PT Frequency  3x / week    PT Duration 6 weeks   increased time secondary to slow healing wound   PT Treatment/Interventions Gait training;Stair training;Functional mobility training;DME Instruction;Therapeutic activities;Therapeutic exercise;Balance training;Neuromuscular re-education;Patient/family education;Compression bandaging;Manual lymph drainage;Manual techniques    PT Next Visit Plan Continue with both chemical and mechanical debridement to remove adherent eschar to allow healing to occure    Consulted and Agree with Plan of Care Patient;Family member/caregiver    Family Member Consulted wife           Patient will benefit from skilled therapeutic intervention in order to improve the following deficits and impairments:  Abnormal gait,Decreased skin integrity,Decreased mobility,Impaired sensation  Visit Diagnosis: Ulcer of right pretibial region, limited to breakdown of skin ([place])  Other abnormalities of gait and mobility     Problem List Patient Active Problem List   Diagnosis Date Noted  . Advanced nonexudative age-related macular degeneration of right eye without subfoveal involvement 11/15/2020  . Advanced nonexudative age-related macular degeneration of left eye without subfoveal involvement 11/15/2020  . Diabetes mellitus without complication ([place]) 53/79/4327  . Posterior vitreous detachment of both eyes 11/15/2020  . Gait disturbance, post-stroke 11/02/2017  . Aphasia as late effect of cerebrovascular accident 11/02/2017  . 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 Aphasia   . PAF (paroxysmal atrial fibrillation) ([place])   . Diabetes mellitus type 2 in obese ([place])   . Benign essential HTN   . Hypothyroidism   . Hyperlipidemia   . Stroke (cerebrum) ([place]) 10/02/2017  . Afib ([place]) 01/10/2017  . SOB (shortness of  breath) 01/10/2017  . Acute diastolic CHF (congestive heart failure) ([place]) 01/10/2017  . Atrial fibrillation with RVR ([place]) 01/10/2017  . Claudication ([place]) 07/25/2016  . Paroxysmal atrial fibrillation ([place]) 07/25/2016  . Odynophagia 04/10/2014  . Allergic rhinitis 03/23/2014  . Chronic rhinitis 12/22/2013  . 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 Constipation 08/22/2011  . Obesity 09/20/2009  . G E R D 11/29/2007  . Celiac disease 11/29/2007    2:17 PM, 02/07/21 Jerene Pitch, DPT Physical Therapy with [facility]  [phone] office  [place] [address] [place], [place], [address] Phone: [phone]   Fax:  [phone]  Name: YOSIEL THIEME MRN: [id number] Date of Birth: 07-Feb-1929

## 2021-02-08 ENCOUNTER — Ambulatory Visit (HOSPITAL_COMMUNITY): Payer: PPO

## 2021-02-08 DIAGNOSIS — D638 Anemia in other chronic diseases classified elsewhere: Secondary | ICD-10-CM | POA: Diagnosis not present

## 2021-02-08 DIAGNOSIS — R809 Proteinuria, unspecified: Secondary | ICD-10-CM | POA: Diagnosis not present

## 2021-02-08 DIAGNOSIS — N189 Chronic kidney disease, unspecified: Secondary | ICD-10-CM | POA: Diagnosis not present

## 2021-02-08 DIAGNOSIS — E1122 Type 2 diabetes mellitus with diabetic chronic kidney disease: Secondary | ICD-10-CM | POA: Diagnosis not present

## 2021-02-08 DIAGNOSIS — R600 Localized edema: Secondary | ICD-10-CM | POA: Diagnosis not present

## 2021-02-08 DIAGNOSIS — E1129 Type 2 diabetes mellitus with other diabetic kidney complication: Secondary | ICD-10-CM | POA: Diagnosis not present

## 2021-02-08 DIAGNOSIS — E211 Secondary hyperparathyroidism, not elsewhere classified: Secondary | ICD-10-CM | POA: Diagnosis not present

## 2021-02-08 DIAGNOSIS — I10 Essential (primary) hypertension: Secondary | ICD-10-CM | POA: Diagnosis not present

## 2021-02-09 DIAGNOSIS — I890 Lymphedema, not elsewhere classified: Secondary | ICD-10-CM | POA: Diagnosis not present

## 2021-02-10 DIAGNOSIS — E7800 Pure hypercholesterolemia, unspecified: Secondary | ICD-10-CM | POA: Diagnosis not present

## 2021-02-10 DIAGNOSIS — I10 Essential (primary) hypertension: Secondary | ICD-10-CM | POA: Diagnosis not present

## 2021-02-10 DIAGNOSIS — K219 Gastro-esophageal reflux disease without esophagitis: Secondary | ICD-10-CM | POA: Diagnosis not present

## 2021-02-11 ENCOUNTER — Ambulatory Visit (HOSPITAL_COMMUNITY): Payer: PPO | Attending: Internal Medicine | Admitting: Physical Therapy

## 2021-02-11 ENCOUNTER — Encounter (HOSPITAL_COMMUNITY): Payer: Self-pay | Admitting: Physical Therapy

## 2021-02-11 ENCOUNTER — Other Ambulatory Visit: Payer: Self-pay

## 2021-02-11 DIAGNOSIS — L97811 Non-pressure chronic ulcer of other part of right lower leg limited to breakdown of skin: Secondary | ICD-10-CM | POA: Diagnosis not present

## 2021-02-11 DIAGNOSIS — R2689 Other abnormalities of gait and mobility: Secondary | ICD-10-CM | POA: Insufficient documentation

## 2021-02-11 NOTE — Therapy (Signed)
[place], [place], [address] Phone: [phone]   Fax:  [phone]  Wound Care Therapy  Patient Details  Name: [name] MRN: [id number] Date of Birth: 09-Jan-1929 Referring Provider (PT): [name]   Encounter Date: 02/11/2021   PT End of Session - 02/11/21 1622    Visit Number 24    Number of Visits 32    Date for PT Re-Evaluation 02/26/21    Authorization Type [employer] (no auth req, no visit limit)    Progress Note Due on Visit 30    PT Start Time 1530    PT Stop Time 1615    PT Time Calculation (min) 45 min    Activity Tolerance Patient tolerated treatment well    Behavior During Therapy [facility] for tasks assessed/performed           Past Medical History:  Diagnosis Date  . Allergic rhinitis   . Asthmatic bronchitis   . Atrial fibrillation ([place])   . Celiac disease   . Cellulitis   . CKD (chronic kidney disease)   . Diabetes ([place])   . Diverticulosis   . Fx. left wrist 1987  . Gastric polyps   . GERD (gastroesophageal reflux disease)   . History of pneumonia   . Hypertension   . Hypothyroidism   . Iron deficiency anemia   . Melanoma ([place])   . Neuropathy   . PAF (paroxysmal atrial fibrillation) ([place])   . Prostate cancer ([place])   . REM sleep behavior disorder   . RLS (restless legs syndrome)   . Sleep apnea   . Spasticity   . Stroke ([place]) 09/2017  . Tremor   . Type 2 diabetes mellitus ([place])     Past Surgical History:  Procedure Laterality Date  . APPENDECTOMY  1978  . CARPAL TUNNEL RELEASE Left   . CATARACT EXTRACTION  03/2011   bilateral   . CHOLECYSTECTOMY  2001  . COLONOSCOPY W/ BIOPSIES  04/27/2008   diverticulosis, duodenitis  . FOOT SURGERY    . HERNIA REPAIR  1994   bilateral  . KNEE SURGERY Bilateral    x 2  . LUNG SURGERY  2001   for infection  . PROSTATECTOMY  2002  . UPPER GASTROINTESTINAL ENDOSCOPY  04/27/2008   celiac disease, gastric polyps     There were no vitals filed for this visit.      [facility] PT Assessment - 02/11/21 0001      Assessment   Medical Diagnosis Chronic venous stasis wound                   Wound Therapy - 02/11/21 0001    Subjective States it barely hurts right now, maybe a 1/10.    Patient and Family Stated Goals wound to heal    Date of Onset 09/06/20    Prior Treatments MD and self care    Evaluation and Treatment Procedures Explained to Patient/Family Yes    Evaluation and Treatment Procedures agreed to;Other (comment)    Wound Properties Date First Assessed: 12/04/20 Time First Assessed: 1545 Wound Type: Venous stasis ulcer Location: Leg Location Orientation: Right Present on Admission: Yes   Dressing Type Compression wrap   santyl   Dressing Changed Changed    Dressing Status Old drainage    Dressing Change Frequency PRN    Site / Wound Assessment Pink;Yellow    % Wound base Red or Granulating 40%    %  Wound base Yellow/Fibrinous Exudate 60%    Peri-wound Assessment Erythema (blanchable);Edema    Drainage Amount Minimal    Drainage Description Serosanguineous    Treatment Cleansed;Debridement (Selective)    Selective Debridement - Location entire wound bed    Selective Debridement - Tools Used Scalpel    Selective Debridement - Tissue Removed slough    Wound Therapy - Clinical Statement Improved warmth noted in toes compared to previous session but increased swelling noted in forefoot. Improved granulation in wound bed but still with some very adherent slough along inferior portion of wound. Perimeters of wound bed continues to be most sensitive. Mild maceration noted along wound edges, applied Vaseline to this area. Blisters noted on anterior shin (where rash was previously) 5 total, applied Vaseline and covered will continue to monitor. continued with santyl and reduced compression of cotton, kerlix and light coban secondary to improved healing with decreased compression.    Wound  Therapy - Functional Problem List gait and balance    Factors Delaying/Impairing Wound Healing Diabetes Mellitus;Vascular compromise;Altered sensation    Wound Therapy - Frequency 3X / week    Wound Plan continue to debride, change dressings, and reduce LE edema to promote wound healing. Follow up with juxta as he can go ahead and get this and use over his wound dressings.    Dressing  santyl on gauze, hydrogel, vaseline,lymph netting #6, cotton, kerlix and light coban and #5 netting                     PT Short Term Goals - 01/03/21 1344      PT SHORT TERM GOAL #1   Title Patient will have at least 50% granualtion tissue.    Time 3    Period Weeks    Status On-going    Target Date 12/25/20             PT Long Term Goals - 01/03/21 1344      PT LONG TERM GOAL #1   Title Patient's wound will be healed to reduce the risk of infection.    Time 6    Period Weeks    Status On-going      PT LONG TERM GOAL #2   Title Patient will be able to verbalize completion of daily skin checks to reduce the risk of further wound development.    Time 6    Period Weeks    Status On-going                 Plan - 02/11/21 1622    Clinical Impression Statement see above    Personal Factors and Comorbidities Age;Comorbidity 3+;Past/Current Experience;Fitness;Time since onset of injury/illness/exacerbation    Comorbidities DM, CKD, neuorpathy, Hx CVA    Examination-Activity Limitations Locomotion Level;Transfers;Squat;Stand;Dressing    Examination-Participation Restrictions Community Activity    Stability/Clinical Decision Making Stable/Uncomplicated    Rehab Potential Good    PT Frequency 3x / week    PT Duration 6 weeks   increased time secondary to slow healing wound   PT Treatment/Interventions Gait training;Stair training;Functional mobility training;DME Instruction;Therapeutic activities;Therapeutic exercise;Balance training;Neuromuscular re-education;Patient/family  education;Compression bandaging;Manual lymph drainage;Manual techniques    PT Next Visit Plan Continue with both chemical and mechanical debridement to remove adherent eschar to allow healing to occure    Consulted and Agree with Plan of Care Patient;Family member/caregiver    Family Member Consulted wife           Patient will benefit from skilled therapeutic  intervention in order to improve the following deficits and impairments:  Abnormal gait,Decreased skin integrity,Decreased mobility,Impaired sensation  Visit Diagnosis: Ulcer of right pretibial region, limited to breakdown of skin ([place])  Other abnormalities of gait and mobility     Problem List Patient Active Problem List   Diagnosis Date Noted  . Advanced nonexudative age-related macular degeneration of right eye without subfoveal involvement 11/15/2020  . Advanced nonexudative age-related macular degeneration of left eye without subfoveal involvement 11/15/2020  . Diabetes mellitus without complication ([place]) 65/78/4696  . Posterior vitreous detachment of both eyes 11/15/2020  . Gait disturbance, post-stroke 11/02/2017  . Aphasia as late effect of cerebrovascular accident 11/02/2017  . 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 Aphasia   . PAF (paroxysmal atrial fibrillation) ([place])   . Diabetes mellitus type 2 in obese ([place])   . Benign essential HTN   . Hypothyroidism   . Hyperlipidemia   . Stroke (cerebrum) ([place]) 10/02/2017  . Afib ([place]) 01/10/2017  . SOB (shortness of breath) 01/10/2017  . Acute diastolic CHF (congestive heart failure) ([place]) 01/10/2017  . Atrial fibrillation with RVR ([place]) 01/10/2017  . Claudication ([place]) 07/25/2016  . Paroxysmal atrial fibrillation ([place]) 07/25/2016  . Odynophagia 04/10/2014  . Allergic rhinitis 03/23/2014  . Chronic rhinitis 12/22/2013  . 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 Constipation 08/22/2011  . Obesity 09/20/2009  . G E R D 11/29/2007  . Celiac disease  11/29/2007   4:31 PM, 02/11/21 Jerene Pitch, DPT Physical Therapy with [facility]  [phone] office  [place] [address] [place], [place], [address] Phone: [phone]   Fax:  [phone]  Name: [name] MRN: [id number] Date of Birth: 10-05-1929

## 2021-02-12 ENCOUNTER — Encounter (INDEPENDENT_AMBULATORY_CARE_PROVIDER_SITE_OTHER): Payer: PPO | Admitting: Ophthalmology

## 2021-02-13 ENCOUNTER — Ambulatory Visit (HOSPITAL_COMMUNITY): Payer: PPO | Admitting: Physical Therapy

## 2021-02-13 ENCOUNTER — Encounter (HOSPITAL_COMMUNITY): Payer: Self-pay | Admitting: Physical Therapy

## 2021-02-13 ENCOUNTER — Other Ambulatory Visit: Payer: Self-pay

## 2021-02-13 DIAGNOSIS — L97811 Non-pressure chronic ulcer of other part of right lower leg limited to breakdown of skin: Secondary | ICD-10-CM

## 2021-02-13 DIAGNOSIS — R2689 Other abnormalities of gait and mobility: Secondary | ICD-10-CM

## 2021-02-13 NOTE — Therapy (Signed)
[place], [place], [address] Phone: [phone]   Fax:  [phone]  Wound Care Therapy  Patient Details  Name: [name] MRN: [id number] Date of Birth: October 29, 1928 Referring Provider (PT): [name]   Encounter Date: 02/13/2021   PT End of Session - 02/13/21 1207    Visit Number 25    Number of Visits 32    Date for PT Re-Evaluation 02/26/21    Authorization Type [employer] (no auth req, no visit limit)    Progress Note Due on Visit 30    PT Start Time 1125    PT Stop Time 1155    PT Time Calculation (min) 30 min    Activity Tolerance Patient tolerated treatment well    Behavior During Therapy [facility] for tasks assessed/performed           Past Medical History:  Diagnosis Date  . Allergic rhinitis   . Asthmatic bronchitis   . Atrial fibrillation ([place])   . Celiac disease   . Cellulitis   . CKD (chronic kidney disease)   . Diabetes ([place])   . Diverticulosis   . Fx. left wrist 1987  . Gastric polyps   . GERD (gastroesophageal reflux disease)   . History of pneumonia   . Hypertension   . Hypothyroidism   . Iron deficiency anemia   . Melanoma ([place])   . Neuropathy   . PAF (paroxysmal atrial fibrillation) ([place])   . Prostate cancer ([place])   . REM sleep behavior disorder   . RLS (restless legs syndrome)   . Sleep apnea   . Spasticity   . Stroke ([place]) 09/2017  . Tremor   . Type 2 diabetes mellitus ([place])     Past Surgical History:  Procedure Laterality Date  . APPENDECTOMY  1978  . CARPAL TUNNEL RELEASE Left   . CATARACT EXTRACTION  03/2011   bilateral   . CHOLECYSTECTOMY  2001  . COLONOSCOPY W/ BIOPSIES  04/27/2008   diverticulosis, duodenitis  . FOOT SURGERY    . HERNIA REPAIR  1994   bilateral  . KNEE SURGERY Bilateral    x 2  . LUNG SURGERY  2001   for infection  . PROSTATECTOMY  2002  . UPPER GASTROINTESTINAL ENDOSCOPY  04/27/2008   celiac disease, gastric polyps     There were no vitals filed for this visit.               Wound Therapy - 02/13/21 0001    Subjective No pain    Patient and Family Stated Goals wound to heal    Date of Onset 09/06/20    Prior Treatments MD and self care    Evaluation and Treatment Procedures Explained to Patient/Family Yes    Evaluation and Treatment Procedures agreed to;Other (comment)    Wound Properties Date First Assessed: 12/04/20 Time First Assessed: 1545 Wound Type: Venous stasis ulcer Location: Leg Location Orientation: Right Present on Admission: Yes   Dressing Type Compression wrap    Dressing Changed Changed    Dressing Status Old drainage    Dressing Change Frequency PRN    Site / Wound Assessment Pink;Yellow    % Wound base Red or Granulating 35%    % Wound base Yellow/Fibrinous Exudate 65%    Peri-wound Assessment Erythema (blanchable)    Wound Length (cm) 3.2 cm    Wound Width (cm) 2.4 cm    Wound Surface Area (cm^2) 7.68 cm^2  Drainage Amount Scant    Drainage Description Serous    Treatment Cleansed;Debridement (Selective)    Selective Debridement - Location entire wound bed    Selective Debridement - Tools Used Scalpel    Selective Debridement - Tissue Removed slough    Wound Therapy - Clinical Statement Foot continues to be cold, attempted photo today but it did not save.  Pt eschar continues to be very adherent even with santyl he will need continued mechanical and chemical debridement to acquire full granulation    Wound Therapy - Functional Problem List gait and balance    Factors Delaying/Impairing Wound Healing Diabetes Mellitus;Vascular compromise;Altered sensation    Wound Therapy - Frequency 3X / week    Wound Plan continue to debride, change dressings, and reduce LE edema to promote wound healing. Follow up with juxta as he can go ahead and get this and use over his wound dressings.    Dressing  santyl on gauze, hydrogel, vaseline,lymph netting #6, cotton, kerlix and  light coban and #5 netting                     PT Short Term Goals - 01/03/21 1344      PT SHORT TERM GOAL #1   Title Patient will have at least 50% granualtion tissue.    Time 3    Period Weeks    Status On-going    Target Date 12/25/20             PT Long Term Goals - 01/03/21 1344      PT LONG TERM GOAL #1   Title Patient's wound will be healed to reduce the risk of infection.    Time 6    Period Weeks    Status On-going      PT LONG TERM GOAL #2   Title Patient will be able to verbalize completion of daily skin checks to reduce the risk of further wound development.    Time 6    Period Weeks    Status On-going                 Plan - 02/13/21 1207    Clinical Impression Statement see above    Personal Factors and Comorbidities Age;Comorbidity 3+;Past/Current Experience;Fitness;Time since onset of injury/illness/exacerbation    Comorbidities DM, CKD, neuorpathy, Hx CVA    Examination-Activity Limitations Locomotion Level;Transfers;Squat;Stand;Dressing    Examination-Participation Restrictions Community Activity    Stability/Clinical Decision Making Stable/Uncomplicated    Rehab Potential Good    PT Frequency 3x / week    PT Duration 6 weeks   increased time secondary to slow healing wound   PT Treatment/Interventions Gait training;Stair training;Functional mobility training;DME Instruction;Therapeutic activities;Therapeutic exercise;Balance training;Neuromuscular re-education;Patient/family education;Compression bandaging;Manual lymph drainage;Manual techniques    PT Next Visit Plan Continue with both chemical and mechanical debridement to remove adherent eschar to allow healing to occure    Consulted and Agree with Plan of Care Patient;Family member/caregiver    Family Member Consulted wife           Patient will benefit from skilled therapeutic intervention in order to improve the following deficits and impairments:  Abnormal gait,Decreased  skin integrity,Decreased mobility,Impaired sensation  Visit Diagnosis: Other abnormalities of gait and mobility  Ulcer of right pretibial region, limited to breakdown of skin [facility])     Problem List Patient Active Problem List   Diagnosis Date Noted  . Advanced nonexudative age-related macular degeneration of right eye without subfoveal involvement 11/15/2020  .  Advanced nonexudative age-related macular degeneration of left eye without subfoveal involvement 11/15/2020  . Diabetes mellitus without complication ([place]) 97/67/3419  . Posterior vitreous detachment of both eyes 11/15/2020  . Gait disturbance, post-stroke 11/02/2017  . Aphasia as late effect of cerebrovascular accident 11/02/2017  . 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 Aphasia   . PAF (paroxysmal atrial fibrillation) ([place])   . Diabetes mellitus type 2 in obese ([place])   . Benign essential HTN   . Hypothyroidism   . Hyperlipidemia   . Stroke (cerebrum) ([place]) 10/02/2017  . Afib ([place]) 01/10/2017  . SOB (shortness of breath) 01/10/2017  . Acute diastolic CHF (congestive heart failure) ([place]) 01/10/2017  . Atrial fibrillation with RVR ([place]) 01/10/2017  . Claudication ([place]) 07/25/2016  . Paroxysmal atrial fibrillation ([place]) 07/25/2016  . Odynophagia 04/10/2014  . Allergic rhinitis 03/23/2014  . Chronic rhinitis 12/22/2013  . 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 Constipation 08/22/2011  . Obesity 09/20/2009  . G E R D 11/29/2007  . Celiac disease 11/29/2007   Rayetta Humphrey, PT CLT [phone] 02/13/2021, 12:08 PM  [place] [address] [place], [place], [address] Phone: [phone]   Fax:  [phone]  Name: [name] MRN: [id number] Date of Birth: 1928/12/07

## 2021-02-14 ENCOUNTER — Ambulatory Visit (HOSPITAL_COMMUNITY): Payer: PPO | Admitting: Physical Therapy

## 2021-02-15 ENCOUNTER — Other Ambulatory Visit: Payer: Self-pay

## 2021-02-15 ENCOUNTER — Ambulatory Visit (HOSPITAL_COMMUNITY): Payer: PPO | Admitting: Physical Therapy

## 2021-02-15 ENCOUNTER — Encounter (HOSPITAL_COMMUNITY): Payer: Self-pay | Admitting: Physical Therapy

## 2021-02-15 DIAGNOSIS — L97811 Non-pressure chronic ulcer of other part of right lower leg limited to breakdown of skin: Secondary | ICD-10-CM | POA: Diagnosis not present

## 2021-02-15 DIAGNOSIS — R2689 Other abnormalities of gait and mobility: Secondary | ICD-10-CM

## 2021-02-15 NOTE — Therapy (Signed)
[place], [place], [address] Phone: [phone]   Fax:  [phone]  Wound Care Therapy  Patient Details  Name: [name] MRN: [id number] Date of Birth: 03/22/1929 Referring Provider (PT): [name]   Encounter Date: 02/15/2021   PT End of Session - 02/15/21 1317    Visit Number 26    Number of Visits 32    Date for PT Re-Evaluation 02/26/21    Authorization Type [employer] (no auth req, no visit limit)    Progress Note Due on Visit 30    PT Start Time 1320    PT Stop Time 1355    PT Time Calculation (min) 35 min    Activity Tolerance Patient tolerated treatment well    Behavior During Therapy [facility] for tasks assessed/performed           Past Medical History:  Diagnosis Date  . Allergic rhinitis   . Asthmatic bronchitis   . Atrial fibrillation ([place])   . Celiac disease   . Cellulitis   . CKD (chronic kidney disease)   . Diabetes ([place])   . Diverticulosis   . Fx. left wrist 1987  . Gastric polyps   . GERD (gastroesophageal reflux disease)   . History of pneumonia   . Hypertension   . Hypothyroidism   . Iron deficiency anemia   . Melanoma ([place])   . Neuropathy   . PAF (paroxysmal atrial fibrillation) ([place])   . Prostate cancer ([place])   . REM sleep behavior disorder   . RLS (restless legs syndrome)   . Sleep apnea   . Spasticity   . Stroke ([place]) 09/2017  . Tremor   . Type 2 diabetes mellitus ([place])     Past Surgical History:  Procedure Laterality Date  . APPENDECTOMY  1978  . CARPAL TUNNEL RELEASE Left   . CATARACT EXTRACTION  03/2011   bilateral   . CHOLECYSTECTOMY  2001  . COLONOSCOPY W/ BIOPSIES  04/27/2008   diverticulosis, duodenitis  . FOOT SURGERY    . HERNIA REPAIR  1994   bilateral  . KNEE SURGERY Bilateral    x 2  . LUNG SURGERY  2001   for infection  . PROSTATECTOMY  2002  . UPPER GASTROINTESTINAL ENDOSCOPY  04/27/2008   celiac disease, gastric polyps     There were no vitals filed for this visit.               Wound Therapy - 02/15/21 0001    Subjective No pain    Patient and Family Stated Goals wound to heal    Date of Onset 09/06/20    Prior Treatments MD and self care    Evaluation and Treatment Procedures Explained to Patient/Family Yes    Evaluation and Treatment Procedures agreed to;Other (comment)    Wound Properties Date First Assessed: 12/04/20 Time First Assessed: 1545 Wound Type: Venous stasis ulcer Location: Leg Location Orientation: Right Present on Admission: Yes   Dressing Type Gauze (Comment);Compression wrap;Santyl    Dressing Changed Changed    Dressing Status Old drainage    Dressing Change Frequency PRN    Site / Wound Assessment Pink;Yellow    % Wound base Red or Granulating 40%    % Wound base Yellow/Fibrinous Exudate 60%    Peri-wound Assessment Erythema (blanchable)    Drainage Amount Minimal    Drainage Description Serous    Treatment Cleansed;Debridement (Selective)    Selective Debridement - Location  entire wound bed    Selective Debridement - Tools Used Scalpel;Forceps    Selective Debridement - Tissue Removed slough    Wound Therapy - Clinical Statement Pt eschar thinned out significantly this session and therapist was able to debride top layer revealing more epithealial buds.  Wound looks the best it has since wound care has started will continue with chemical and mechanical debridement.    Wound Therapy - Functional Problem List gait and balance    Factors Delaying/Impairing Wound Healing Diabetes Mellitus;Vascular compromise;Altered sensation    Wound Therapy - Frequency 3X / week    Wound Plan continue to debride, change dressings, and reduce LE edema to promote wound healing. Follow up with juxta as he can go ahead and get this and use over his wound dressings.    Dressing  santyl on gauze, hydrogel, vaseline,kerlix, coband and netting                     PT Short Term  Goals - 01/03/21 1344      PT SHORT TERM GOAL #1   Title Patient will have at least 50% granualtion tissue.    Time 3    Period Weeks    Status On-going    Target Date 12/25/20             PT Long Term Goals - 01/03/21 1344      PT LONG TERM GOAL #1   Title Patient's wound will be healed to reduce the risk of infection.    Time 6    Period Weeks    Status On-going      PT LONG TERM GOAL #2   Title Patient will be able to verbalize completion of daily skin checks to reduce the risk of further wound development.    Time 6    Period Weeks    Status On-going                 Plan - 02/15/21 1317    Clinical Impression Statement see above    Personal Factors and Comorbidities Age;Comorbidity 3+;Past/Current Experience;Fitness;Time since onset of injury/illness/exacerbation    Comorbidities DM, CKD, neuorpathy, Hx CVA    Examination-Activity Limitations Locomotion Level;Transfers;Squat;Stand;Dressing    Examination-Participation Restrictions Community Activity    Stability/Clinical Decision Making Stable/Uncomplicated    Rehab Potential Good    PT Frequency 3x / week    PT Duration 6 weeks   increased time secondary to slow healing wound   PT Treatment/Interventions Gait training;Stair training;Functional mobility training;DME Instruction;Therapeutic activities;Therapeutic exercise;Balance training;Neuromuscular re-education;Patient/family education;Compression bandaging;Manual lymph drainage;Manual techniques    PT Next Visit Plan Continue with both chemical and mechanical debridement to remove adherent eschar to allow healing to occure    Consulted and Agree with Plan of Care Patient;Family member/caregiver    Family Member Consulted wife           Patient will benefit from skilled therapeutic intervention in order to improve the following deficits and impairments:  Abnormal gait,Decreased skin integrity,Decreased mobility,Impaired sensation  Visit  Diagnosis: Other abnormalities of gait and mobility  Ulcer of right pretibial region, limited to breakdown of skin [facility])     Problem List Patient Active Problem List   Diagnosis Date Noted  . Advanced nonexudative age-related macular degeneration of right eye without subfoveal involvement 11/15/2020  . Advanced nonexudative age-related macular degeneration of left eye without subfoveal involvement 11/15/2020  . Diabetes mellitus without complication ([place]) 77/82/4235  . Posterior vitreous detachment of both  eyes 11/15/2020  . Gait disturbance, post-stroke 11/02/2017  . Aphasia as late effect of cerebrovascular accident 11/02/2017  . 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 Aphasia   . PAF (paroxysmal atrial fibrillation) ([place])   . Diabetes mellitus type 2 in obese ([place])   . Benign essential HTN   . Hypothyroidism   . Hyperlipidemia   . Stroke (cerebrum) ([place]) 10/02/2017  . Afib ([place]) 01/10/2017  . SOB (shortness of breath) 01/10/2017  . Acute diastolic CHF (congestive heart failure) ([place]) 01/10/2017  . Atrial fibrillation with RVR ([place]) 01/10/2017  . Claudication ([place]) 07/25/2016  . Paroxysmal atrial fibrillation ([place]) 07/25/2016  . Odynophagia 04/10/2014  . Allergic rhinitis 03/23/2014  . Chronic rhinitis 12/22/2013  . 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 Constipation 08/22/2011  . Obesity 09/20/2009  . G E R D 11/29/2007  . Celiac disease 11/29/2007    Rayetta Humphrey, PT CLT [phone] 02/15/2021, 1:54 PM  [place] [address] [place], [place], [address] Phone: [phone]   Fax:  [phone]  Name: [name] MRN: [id number] Date of Birth: 09/06/1929

## 2021-02-18 ENCOUNTER — Ambulatory Visit (HOSPITAL_COMMUNITY): Payer: PPO | Admitting: Physical Therapy

## 2021-02-18 ENCOUNTER — Other Ambulatory Visit: Payer: Self-pay

## 2021-02-18 ENCOUNTER — Encounter (HOSPITAL_COMMUNITY): Payer: Self-pay | Admitting: Physical Therapy

## 2021-02-18 DIAGNOSIS — L97811 Non-pressure chronic ulcer of other part of right lower leg limited to breakdown of skin: Secondary | ICD-10-CM | POA: Diagnosis not present

## 2021-02-18 DIAGNOSIS — R2689 Other abnormalities of gait and mobility: Secondary | ICD-10-CM

## 2021-02-18 NOTE — Therapy (Signed)
[place], [place], [address] Phone: [phone]   Fax:  [phone]  Wound Care Therapy  Patient Details  Name: [name] MRN: [id number] Date of Birth: 1929-08-31 Referring Provider (PT): [name]   Encounter Date: 02/18/2021   PT End of Session - 02/18/21 1356    Visit Number 27    Number of Visits 32    Date for PT Re-Evaluation 02/26/21    Authorization Type [employer] (no auth req, no visit limit)    Progress Note Due on Visit 30    PT Start Time 1316    PT Stop Time 1354    PT Time Calculation (min) 38 min    Activity Tolerance Patient tolerated treatment well    Behavior During Therapy [facility] for tasks assessed/performed           Past Medical History:  Diagnosis Date  . Allergic rhinitis   . Asthmatic bronchitis   . Atrial fibrillation ([place])   . Celiac disease   . Cellulitis   . CKD (chronic kidney disease)   . Diabetes ([place])   . Diverticulosis   . Fx. left wrist 1987  . Gastric polyps   . GERD (gastroesophageal reflux disease)   . History of pneumonia   . Hypertension   . Hypothyroidism   . Iron deficiency anemia   . Melanoma ([place])   . Neuropathy   . PAF (paroxysmal atrial fibrillation) ([place])   . Prostate cancer ([place])   . REM sleep behavior disorder   . RLS (restless legs syndrome)   . Sleep apnea   . Spasticity   . Stroke ([place]) 09/2017  . Tremor   . Type 2 diabetes mellitus ([place])     Past Surgical History:  Procedure Laterality Date  . APPENDECTOMY  1978  . CARPAL TUNNEL RELEASE Left   . CATARACT EXTRACTION  03/2011   bilateral   . CHOLECYSTECTOMY  2001  . COLONOSCOPY W/ BIOPSIES  04/27/2008   diverticulosis, duodenitis  . FOOT SURGERY    . HERNIA REPAIR  1994   bilateral  . KNEE SURGERY Bilateral    x 2  . LUNG SURGERY  2001   for infection  . PROSTATECTOMY  2002  . UPPER GASTROINTESTINAL ENDOSCOPY  04/27/2008   celiac disease, gastric polyps     There were no vitals filed for this visit.      [facility] PT Assessment - 02/18/21 0001      Assessment   Medical Diagnosis Chronic venous stasis wound    Referring Provider (PT) [name]                   Wound Therapy - 02/18/21 0001    Subjective Reports slight increase in pain.    Patient and Family Stated Goals wound to heal    Date of Onset 09/06/20    Prior Treatments MD and self care    Evaluation and Treatment Procedures Explained to Patient/Family Yes    Evaluation and Treatment Procedures agreed to;Other (comment)    Wound Properties Date First Assessed: 12/04/20 Time First Assessed: 1545 Wound Type: Venous stasis ulcer Location: Leg Location Orientation: Right Present on Admission: Yes   Wound Image View All Images View Images    Dressing Type Gauze (Comment);Compression wrap   santyl   Dressing Changed Changed    Dressing Status Old drainage    Dressing Change Frequency PRN    Site / Wound Assessment  Bleeding;Pink;Yellow    % Wound base Red or Granulating 45%    % Wound base Yellow/Fibrinous Exudate 65%    Peri-wound Assessment Edema;Erythema (blanchable);Maceration    Wound Length (cm) 3.2 cm   was 3.2   Wound Width (cm) 2.5 cm   was 2.4   Wound Depth (cm) 0.5 cm    Wound Volume (cm^3) 4 cm^3    Wound Surface Area (cm^2) 8 cm^2    Margins Unattached edges (unapproximated)    Drainage Amount Minimal    Drainage Description Serosanguineous    Treatment Cleansed;Debridement (Selective)    Selective Debridement - Location entire wound bed    Selective Debridement - Tools Used Scalpel;Forceps    Selective Debridement - Tissue Removed slough    Wound Therapy - Clinical Statement Wound bed continues to improve in granulation tissue. Depth increased but this is anticipated secondary to debridement of slough. Edges from 11-2 flush with wound bed. Continued with debridement of adherent slough. Continued with santyl and hydrogel, followed by kerlix and coban.  Will continued with current POC.    Wound Therapy - Functional Problem List gait and balance    Factors Delaying/Impairing Wound Healing Diabetes Mellitus;Vascular compromise;Altered sensation    Wound Therapy - Frequency 3X / week    Wound Plan continue to debride, change dressings, and reduce LE edema to promote wound healing. Follow up with juxta as he can go ahead and get this and use over his wound dressings.    Dressing  santyl on gauze, hydrogel, vaseline,kerlix, coband and netting                     PT Short Term Goals - 01/03/21 1344      PT SHORT TERM GOAL #1   Title Patient will have at least 50% granualtion tissue.    Time 3    Period Weeks    Status On-going    Target Date 12/25/20             PT Long Term Goals - 01/03/21 1344      PT LONG TERM GOAL #1   Title Patient's wound will be healed to reduce the risk of infection.    Time 6    Period Weeks    Status On-going      PT LONG TERM GOAL #2   Title Patient will be able to verbalize completion of daily skin checks to reduce the risk of further wound development.    Time 6    Period Weeks    Status On-going                 Plan - 02/18/21 1357    Clinical Impression Statement see above    Personal Factors and Comorbidities Age;Comorbidity 3+;Past/Current Experience;Fitness;Time since onset of injury/illness/exacerbation    Comorbidities DM, CKD, neuorpathy, Hx CVA    Examination-Activity Limitations Locomotion Level;Transfers;Squat;Stand;Dressing    Examination-Participation Restrictions Community Activity    Stability/Clinical Decision Making Stable/Uncomplicated    Rehab Potential Good    PT Frequency 3x / week    PT Duration 6 weeks   increased time secondary to slow healing wound   PT Treatment/Interventions Gait training;Stair training;Functional mobility training;DME Instruction;Therapeutic activities;Therapeutic exercise;Balance training;Neuromuscular re-education;Patient/family  education;Compression bandaging;Manual lymph drainage;Manual techniques    PT Next Visit Plan Continue with both chemical and mechanical debridement to remove adherent eschar to allow healing to occure    Consulted and Agree with Plan of Care Patient;Family member/caregiver    Family  Member Consulted wife           Patient will benefit from skilled therapeutic intervention in order to improve the following deficits and impairments:  Abnormal gait,Decreased skin integrity,Decreased mobility,Impaired sensation  Visit Diagnosis: Other abnormalities of gait and mobility  Ulcer of right pretibial region, limited to breakdown of skin [facility])     Problem List Patient Active Problem List   Diagnosis Date Noted  . Advanced nonexudative age-related macular degeneration of right eye without subfoveal involvement 11/15/2020  . Advanced nonexudative age-related macular degeneration of left eye without subfoveal involvement 11/15/2020  . Diabetes mellitus without complication ([place]) 68/37/2902  . Posterior vitreous detachment of both eyes 11/15/2020  . Gait disturbance, post-stroke 11/02/2017  . Aphasia as late effect of cerebrovascular accident 11/02/2017  . 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 Aphasia   . PAF (paroxysmal atrial fibrillation) ([place])   . Diabetes mellitus type 2 in obese ([place])   . Benign essential HTN   . Hypothyroidism   . Hyperlipidemia   . Stroke (cerebrum) ([place]) 10/02/2017  . Afib ([place]) 01/10/2017  . SOB (shortness of breath) 01/10/2017  . Acute diastolic CHF (congestive heart failure) ([place]) 01/10/2017  . Atrial fibrillation with RVR ([place]) 01/10/2017  . Claudication ([place]) 07/25/2016  . Paroxysmal atrial fibrillation ([place]) 07/25/2016  . Odynophagia 04/10/2014  . Allergic rhinitis 03/23/2014  . Chronic rhinitis 12/22/2013  . 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 Constipation 08/22/2011  . Obesity 09/20/2009  . G E R D 11/29/2007  . Celiac disease  11/29/2007    2:33 PM, 02/18/21 Jerene Pitch, DPT Physical Therapy with [facility]  [phone] office  [place] [address] [place], [place], [address] Phone: [phone]   Fax:  [phone]  Name: [name] MRN: [id number] Date of Birth: 1929/07/08

## 2021-02-19 ENCOUNTER — Ambulatory Visit (HOSPITAL_COMMUNITY): Payer: PPO | Admitting: Physical Therapy

## 2021-02-19 DIAGNOSIS — E1142 Type 2 diabetes mellitus with diabetic polyneuropathy: Secondary | ICD-10-CM | POA: Diagnosis not present

## 2021-02-19 DIAGNOSIS — B351 Tinea unguium: Secondary | ICD-10-CM | POA: Diagnosis not present

## 2021-02-20 ENCOUNTER — Encounter (HOSPITAL_COMMUNITY): Payer: Self-pay | Admitting: Physical Therapy

## 2021-02-20 ENCOUNTER — Ambulatory Visit (HOSPITAL_COMMUNITY): Payer: PPO | Admitting: Physical Therapy

## 2021-02-20 ENCOUNTER — Other Ambulatory Visit: Payer: Self-pay

## 2021-02-20 DIAGNOSIS — L97811 Non-pressure chronic ulcer of other part of right lower leg limited to breakdown of skin: Secondary | ICD-10-CM | POA: Diagnosis not present

## 2021-02-20 DIAGNOSIS — R2689 Other abnormalities of gait and mobility: Secondary | ICD-10-CM

## 2021-02-20 DIAGNOSIS — E119 Type 2 diabetes mellitus without complications: Secondary | ICD-10-CM | POA: Diagnosis not present

## 2021-02-20 NOTE — Therapy (Signed)
[place], [place], [address] Phone: [phone]   Fax:  [phone]  Wound Care Therapy  Patient Details  Name: [name] MRN: [id number] Date of Birth: [birth year] Referring Provider (PT): [name]   Encounter Date: 02/20/2021   PT End of Session - 02/20/21 1441    Visit Number 28    Number of Visits 32    Date for PT Re-Evaluation 02/26/21    Authorization Type [employer] (no auth req, no visit limit)    Progress Note Due on Visit 30    PT Start Time 1404    PT Stop Time 1440    PT Time Calculation (min) 36 min    Activity Tolerance Patient tolerated treatment well    Behavior During Therapy [facility] for tasks assessed/performed           Past Medical History:  Diagnosis Date  . Allergic rhinitis   . Asthmatic bronchitis   . Atrial fibrillation ([place])   . Celiac disease   . Cellulitis   . CKD (chronic kidney disease)   . Diabetes ([place])   . Diverticulosis   . Fx. left wrist 1987  . Gastric polyps   . GERD (gastroesophageal reflux disease)   . History of pneumonia   . Hypertension   . Hypothyroidism   . Iron deficiency anemia   . Melanoma ([place])   . Neuropathy   . PAF (paroxysmal atrial fibrillation) ([place])   . Prostate cancer ([place])   . REM sleep behavior disorder   . RLS (restless legs syndrome)   . Sleep apnea   . Spasticity   . Stroke ([place]) 09/2017  . Tremor   . Type 2 diabetes mellitus ([place])     Past Surgical History:  Procedure Laterality Date  . APPENDECTOMY  1978  . CARPAL TUNNEL RELEASE Left   . CATARACT EXTRACTION  03/2011   bilateral   . CHOLECYSTECTOMY  2001  . COLONOSCOPY W/ BIOPSIES  04/27/2008   diverticulosis, duodenitis  . FOOT SURGERY    . HERNIA REPAIR  1994   bilateral  . KNEE SURGERY Bilateral    x 2  . LUNG SURGERY  2001   for infection  . PROSTATECTOMY  2002  . UPPER GASTROINTESTINAL ENDOSCOPY  04/27/2008   celiac disease, gastric polyps     There were no vitals filed for this visit.      [facility] PT Assessment - 02/20/21 0001      Assessment   Medical Diagnosis Chronic venous stasis wound    Referring Provider (PT) [name]                   Wound Therapy - 02/20/21 0001    Subjective Reports he had increased pain following last session but now it is at baseline    Patient and Family Stated Goals wound to heal    Date of Onset 09/06/20    Prior Treatments MD and self care    Pain Scale Faces    Faces Pain Scale Hurts little more    Evaluation and Treatment Procedures Explained to Patient/Family Yes    Evaluation and Treatment Procedures agreed to;Other (comment)    Wound Properties Date First Assessed: 12/04/20 Time First Assessed: 1545 Wound Type: Venous stasis ulcer Location: Leg Location Orientation: Right Present on Admission: Yes   Dressing Type Gauze (Comment);Compression wrap   santyl   Dressing Changed Changed    Dressing Status Old drainage  Dressing Change Frequency PRN    Site / Wound Assessment Bleeding;Pink;Yellow    % Wound base Red or Granulating 45%    % Wound base Yellow/Fibrinous Exudate 65%    Peri-wound Assessment Edema;Erythema (blanchable)    Margins Unattached edges (unapproximated)    Drainage Amount Minimal    Drainage Description Serosanguineous    Treatment Cleansed;Debridement (Selective)    Selective Debridement - Location entire wound bed    Selective Debridement - Tools Used Scalpel    Selective Debridement - Tissue Removed slough    Wound Therapy - Clinical Statement Patient with continued increase in overall buds in granulation tissue. Pain noted in periwound area but overall redness continues. Rash reappeared along anterior region of lower leg, applied xeroform to this area. Continued with santyl and hydrogel to wound bed and kerlix and coban for light compression. Patient inquired about juxtafit wraps and if he could wear those instead of being wrapped once he gets  those. Will confirm with lymph specialist about this for next session. Will continue with current POC.    Wound Therapy - Functional Problem List gait and balance    Factors Delaying/Impairing Wound Healing Diabetes Mellitus;Vascular compromise;Altered sensation    Wound Therapy - Frequency 3X / week    Wound Plan continue to debride, change dressings, and reduce LE edema to promote wound healing. Follow up with juxta as he can go ahead and get this and use over his wound dressings.    Dressing  santyl on gauze, hydrogel, vaseline,kerlix, coban and netting, xeroform on rash area on front of leg.                     PT Short Term Goals - 01/03/21 1344      PT SHORT TERM GOAL #1   Title Patient will have at least 50% granualtion tissue.    Time 3    Period Weeks    Status On-going    Target Date 12/25/20             PT Long Term Goals - 01/03/21 1344      PT LONG TERM GOAL #1   Title Patient's wound will be healed to reduce the risk of infection.    Time 6    Period Weeks    Status On-going      PT LONG TERM GOAL #2   Title Patient will be able to verbalize completion of daily skin checks to reduce the risk of further wound development.    Time 6    Period Weeks    Status On-going                 Plan - 02/20/21 1441    Clinical Impression Statement see above    Personal Factors and Comorbidities Age;Comorbidity 3+;Past/Current Experience;Fitness;Time since onset of injury/illness/exacerbation    Comorbidities DM, CKD, neuorpathy, Hx CVA    Examination-Activity Limitations Locomotion Level;Transfers;Squat;Stand;Dressing    Examination-Participation Restrictions Community Activity    Stability/Clinical Decision Making Stable/Uncomplicated    Rehab Potential Good    PT Frequency 3x / week    PT Duration 6 weeks   increased time secondary to slow healing wound   PT Treatment/Interventions Gait training;Stair training;Functional mobility training;DME  Instruction;Therapeutic activities;Therapeutic exercise;Balance training;Neuromuscular re-education;Patient/family education;Compression bandaging;Manual lymph drainage;Manual techniques    PT Next Visit Plan Continue with both chemical and mechanical debridement to remove adherent eschar to allow healing to occure    Consulted and Agree with Plan of Care  Patient;Family member/caregiver    Family Member Consulted wife           Patient will benefit from skilled therapeutic intervention in order to improve the following deficits and impairments:  Abnormal gait,Decreased skin integrity,Decreased mobility,Impaired sensation  Visit Diagnosis: Other abnormalities of gait and mobility  Ulcer of right pretibial region, limited to breakdown of skin [facility])     Problem List Patient Active Problem List   Diagnosis Date Noted  . Advanced nonexudative age-related macular degeneration of right eye without subfoveal involvement 11/15/2020  . Advanced nonexudative age-related macular degeneration of left eye without subfoveal involvement 11/15/2020  . Diabetes mellitus without complication ([place]) 97/11/6376  . Posterior vitreous detachment of both eyes 11/15/2020  . Gait disturbance, post-stroke 11/02/2017  . Aphasia as late effect of cerebrovascular accident 11/02/2017  . 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 Aphasia   . PAF (paroxysmal atrial fibrillation) ([place])   . Diabetes mellitus type 2 in obese ([place])   . Benign essential HTN   . Hypothyroidism   . Hyperlipidemia   . Stroke (cerebrum) ([place]) 10/02/2017  . Afib ([place]) 01/10/2017  . SOB (shortness of breath) 01/10/2017  . Acute diastolic CHF (congestive heart failure) ([place]) 01/10/2017  . Atrial fibrillation with RVR ([place]) 01/10/2017  . Claudication ([place]) 07/25/2016  . Paroxysmal atrial fibrillation ([place]) 07/25/2016  . Odynophagia 04/10/2014  . Allergic rhinitis 03/23/2014  . Chronic rhinitis 12/22/2013  . Intrinsic  asthma 07/15/2013  . Constipation 08/22/2011  . Obesity 09/20/2009  . G E R D 11/29/2007  . Celiac disease 11/29/2007    3:23 PM, 02/20/21 Jerene Pitch, DPT Physical Therapy with [facility]  [phone] office  [place] [address] [place], [place], [address] Phone: [phone]   Fax:  [phone]  Name: [name] MRN: [id number] Date of Birth: [birth year]

## 2021-02-21 ENCOUNTER — Ambulatory Visit (HOSPITAL_COMMUNITY): Payer: PPO | Admitting: Physical Therapy

## 2021-02-22 ENCOUNTER — Ambulatory Visit (HOSPITAL_COMMUNITY): Payer: PPO

## 2021-02-22 ENCOUNTER — Ambulatory Visit (HOSPITAL_COMMUNITY): Payer: PPO | Admitting: Physical Therapy

## 2021-02-22 ENCOUNTER — Other Ambulatory Visit: Payer: Self-pay

## 2021-02-22 ENCOUNTER — Encounter (HOSPITAL_COMMUNITY): Payer: Self-pay

## 2021-02-22 DIAGNOSIS — R2689 Other abnormalities of gait and mobility: Secondary | ICD-10-CM

## 2021-02-22 DIAGNOSIS — L97811 Non-pressure chronic ulcer of other part of right lower leg limited to breakdown of skin: Secondary | ICD-10-CM | POA: Diagnosis not present

## 2021-02-22 NOTE — Therapy (Signed)
[place], [place], [address] Phone: [phone]   Fax:  [phone]  Physical Therapy Treatment  Patient Details  Name: [name] MRN: [id number] Date of Birth: [birth year] Referring Provider (PT): [name]   Encounter Date: 02/22/2021   PT End of Session - 02/22/21 1157    Visit Number 29    Number of Visits 32    Date for PT Re-Evaluation 02/26/21    Authorization Type [employer] (no auth req, no visit limit)    Progress Note Due on Visit 30    PT Start Time 1055    PT Stop Time 1128    PT Time Calculation (min) 33 min    Activity Tolerance Patient tolerated treatment well    Behavior During Therapy [facility] for tasks assessed/performed           Past Medical History:  Diagnosis Date  . Allergic rhinitis   . Asthmatic bronchitis   . Atrial fibrillation ([place])   . Celiac disease   . Cellulitis   . CKD (chronic kidney disease)   . Diabetes ([place])   . Diverticulosis   . Fx. left wrist 1987  . Gastric polyps   . GERD (gastroesophageal reflux disease)   . History of pneumonia   . Hypertension   . Hypothyroidism   . Iron deficiency anemia   . Melanoma ([place])   . Neuropathy   . PAF (paroxysmal atrial fibrillation) ([place])   . Prostate cancer ([place])   . REM sleep behavior disorder   . RLS (restless legs syndrome)   . Sleep apnea   . Spasticity   . Stroke ([place]) 09/2017  . Tremor   . Type 2 diabetes mellitus ([place])     Past Surgical History:  Procedure Laterality Date  . APPENDECTOMY  1978  . CARPAL TUNNEL RELEASE Left   . CATARACT EXTRACTION  03/2011   bilateral   . CHOLECYSTECTOMY  2001  . COLONOSCOPY W/ BIOPSIES  04/27/2008   diverticulosis, duodenitis  . FOOT SURGERY    . HERNIA REPAIR  1994   bilateral  . KNEE SURGERY Bilateral    x 2  . LUNG SURGERY  2001   for infection  . PROSTATECTOMY  2002  . UPPER GASTROINTESTINAL ENDOSCOPY  04/27/2008   celiac disease, gastric polyps     There were no vitals filed for this visit.   Subjective Assessment - 02/22/21 1153    Subjective Feeling good, no reports of pain.    Currently in Pain? No/denies                           Wound Therapy - 02/22/21 0001    Subjective Feeling good, no reports of pain.    Patient and Family Stated Goals wound to heal    Date of Onset 09/06/20    Prior Treatments MD and self care    Pain Scale 0-10    Pain Score 0-No pain    Evaluation and Treatment Procedures Explained to Patient/Family Yes    Evaluation and Treatment Procedures agreed to    Wound Properties Date First Assessed: 12/04/20 Time First Assessed: 9622 Wound Type: Venous stasis ulcer Location: Leg Location Orientation: Right Present on Admission: Yes   Wound Image View All Images View Images    Dressing Type Gauze (Comment)   Santyl on gauze, hydrogel, xeroform on rash anterior shin, kerlix and coban   Dressing Changed  Changed    Dressing Status Old drainage    Dressing Change Frequency PRN    Site / Wound Assessment Bleeding;Pink;Yellow    % Wound base Red or Granulating 45%    % Wound base Yellow/Fibrinous Exudate 65%    Peri-wound Assessment Erythema (blanchable)    Wound Length (cm) 3.2 cm    Wound Width (cm) 2.4 cm    Wound Depth (cm) 0.5 cm    Wound Volume (cm^3) 3.84 cm^3    Wound Surface Area (cm^2) 7.68 cm^2    Margins Unattached edges (unapproximated)    Drainage Amount Minimal    Drainage Description Serosanguineous    Treatment Debridement (Selective);Cleansed    Selective Debridement - Location entire wound bed    Selective Debridement - Tools Used Scalpel    Selective Debridement - Tissue Removed slough    Wound Therapy - Clinical Statement Increased granulation tissue following selective debridement with scalpel.  Continued with Santyl to assist with reducting in adherence slough to wound bed.  Kerlix and coban for edema control.    Wound Therapy - Functional Problem List gait and  balance    Factors Delaying/Impairing Wound Healing Diabetes Mellitus;Vascular compromise;Altered sensation    Wound Therapy - Frequency 3X / week    Wound Plan Progress note 2/37/62  per cert.  continue to debride, change dressings, and reduce LE edema to promote wound healing. Follow up with juxta as he can go ahead and get this and use over his wound dressings.    Dressing  santyl on gauze, hydrogel, vaseline,kerlix, coban and netting, xeroform on rash area on front of leg.                      PT Short Term Goals - 01/03/21 1344      PT SHORT TERM GOAL #1   Title Patient will have at least 50% granualtion tissue.    Time 3    Period Weeks    Status On-going    Target Date 12/25/20             PT Long Term Goals - 01/03/21 1344      PT LONG TERM GOAL #1   Title Patient's wound will be healed to reduce the risk of infection.    Time 6    Period Weeks    Status On-going      PT LONG TERM GOAL #2   Title Patient will be able to verbalize completion of daily skin checks to reduce the risk of further wound development.    Time 6    Period Weeks    Status On-going                  Patient will benefit from skilled therapeutic intervention in order to improve the following deficits and impairments:     Visit Diagnosis: Ulcer of right pretibial region, limited to breakdown of skin (HCC)  Other abnormalities of gait and mobility     Problem List Patient Active Problem List   Diagnosis Date Noted  . Advanced nonexudative age-related macular degeneration of right eye without subfoveal involvement 11/15/2020  . Advanced nonexudative age-related macular degeneration of left eye without subfoveal involvement 11/15/2020  . Diabetes mellitus without complication ([place]) 83/15/1761  . Posterior vitreous detachment of both eyes 11/15/2020  . Gait disturbance, post-stroke 11/02/2017  . Aphasia as late effect of cerebrovascular accident 11/02/2017  . Left  middle cerebral artery stroke ([place]) 10/07/2017  . Aphasia   .  PAF (paroxysmal atrial fibrillation) ()   . Diabetes mellitus type 2 in obese ([place])   . Benign essential HTN   . Hypothyroidism   . Hyperlipidemia   . Stroke (cerebrum) ([place]) 10/02/2017  . Afib ([place]) 01/10/2017  . SOB (shortness of breath) 01/10/2017  . Acute diastolic CHF (congestive heart failure) ([place]) 01/10/2017  . Atrial fibrillation with RVR ([place]) 01/10/2017  . Claudication ([place]) 07/25/2016  . Paroxysmal atrial fibrillation ([place]) 07/25/2016  . Odynophagia 04/10/2014  . Allergic rhinitis 03/23/2014  . Chronic rhinitis 12/22/2013  . 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 Constipation 08/22/2011  . Obesity 09/20/2009  . G E R D 11/29/2007  . Celiac disease 11/29/2007   Ihor Austin, LPTA/CLT; CBIS [phone]  Aldona Lento 02/22/2021, 12:00 PM  [place], [place], [address] Phone: [phone]   Fax:  [phone]  Name: [name] MRN: [id number] Date of Birth: December 17, 1928

## 2021-02-25 ENCOUNTER — Encounter (HOSPITAL_COMMUNITY): Payer: Self-pay | Admitting: Physical Therapy

## 2021-02-25 ENCOUNTER — Ambulatory Visit (HOSPITAL_COMMUNITY): Payer: PPO | Admitting: Physical Therapy

## 2021-02-25 ENCOUNTER — Other Ambulatory Visit: Payer: Self-pay

## 2021-02-25 DIAGNOSIS — R2689 Other abnormalities of gait and mobility: Secondary | ICD-10-CM

## 2021-02-25 DIAGNOSIS — L97811 Non-pressure chronic ulcer of other part of right lower leg limited to breakdown of skin: Secondary | ICD-10-CM | POA: Diagnosis not present

## 2021-02-25 NOTE — Therapy (Signed)
[place], [place], [address] Phone: [phone]   Fax:  [phone]  Wound Care Therapy, Progress Note and RECERT  Patient Details  Name: [name] MRN: [id number] Date of Birth: [birth year] Referring Provider (PT): [name]  Progress Note Reporting Period 01/03/21 to 02/25/21  See note below for Objective Data and Assessment of Progress/Goals.       Encounter Date: 02/25/2021   PT End of Session - 02/25/21 1358    Visit Number 30    Number of Visits 48    Date for PT Re-Evaluation 04/08/21    Authorization Type [employer] (no auth req, no visit limit)    Progress Note Due on Visit 40    PT Start Time 1315    PT Stop Time 1352    PT Time Calculation (min) 37 min    Activity Tolerance Patient tolerated treatment well    Behavior During Therapy [facility] for tasks assessed/performed           Past Medical History:  Diagnosis Date  . Allergic rhinitis   . Asthmatic bronchitis   . Atrial fibrillation ([place])   . Celiac disease   . Cellulitis   . CKD (chronic kidney disease)   . Diabetes ([place])   . Diverticulosis   . Fx. left wrist 1987  . Gastric polyps   . GERD (gastroesophageal reflux disease)   . History of pneumonia   . Hypertension   . Hypothyroidism   . Iron deficiency anemia   . Melanoma ([place])   . Neuropathy   . PAF (paroxysmal atrial fibrillation) ([place])   . Prostate cancer ([place])   . REM sleep behavior disorder   . RLS (restless legs syndrome)   . Sleep apnea   . Spasticity   . Stroke ([place]) 09/2017  . Tremor   . Type 2 diabetes mellitus ([place])     Past Surgical History:  Procedure Laterality Date  . APPENDECTOMY  1978  . CARPAL TUNNEL RELEASE Left   . CATARACT EXTRACTION  03/2011   bilateral   . CHOLECYSTECTOMY  2001  . COLONOSCOPY W/ BIOPSIES  04/27/2008   diverticulosis, duodenitis  . FOOT SURGERY    . HERNIA REPAIR  1994   bilateral  . KNEE SURGERY Bilateral    x  2  . LUNG SURGERY  2001   for infection  . PROSTATECTOMY  2002  . UPPER GASTROINTESTINAL ENDOSCOPY  04/27/2008   celiac disease, gastric polyps    There were no vitals filed for this visit.      [facility] PT Assessment - 02/25/21 0001      Assessment   Medical Diagnosis Chronic venous stasis wound    Referring Provider (PT) [name]                   Wound Therapy - 02/25/21 0001    Subjective States he has been having more pain than usual, right on the back side of his leg. States he does prop his leg up on the reclinver about where he hurts. No current pain    Patient and Family Stated Goals wound to heal    Date of Onset 09/06/20    Prior Treatments MD and self care    Pain Scale 0-10    Pain Score 0-No pain    Evaluation and Treatment Procedures Explained to Patient/Family Yes    Evaluation and Treatment Procedures agreed to    Wound Properties  Date First Assessed: 12/04/20 Time First Assessed: 1545 Wound Type: Venous stasis ulcer Location: Leg Location Orientation: Right Present on Admission: Yes   Wound Image View All Images View Images    Dressing Type Gauze (Comment)   santyl and coban   Dressing Changed Changed    Dressing Status Old drainage    Dressing Change Frequency PRN    Site / Wound Assessment Granulation tissue;Bleeding;Yellow;Painful    % Wound base Red or Granulating 50%    % Wound base Yellow/Fibrinous Exudate 50%    Peri-wound Assessment Erythema (blanchable);Edema   hives/rash   Wound Length (cm) 3.1 cm   was 3.2   Wound Width (cm) 2.6 cm   was 2.4   Wound Depth (cm) 0.5 cm   was .5   Wound Volume (cm^3) 4.03 cm^3    Wound Surface Area (cm^2) 8.06 cm^2    Margins Unattached edges (unapproximated)    Drainage Amount Minimal    Drainage Description Serosanguineous    Treatment Debridement (Selective);Cleansed    Selective Debridement - Location entire wound bed    Selective Debridement - Tools Used Scalpel    Selective Debridement - Tissue  Removed slough    Wound Therapy - Clinical Statement Overall wound continue to improve in granulation tissue. Slight increase in measuremnets but this is to be expected with debridemnet. Rash noted along entire anterior shin and foot, took picture to mobnitor. Rash looks like hives. Changed base dressing to soft lymph netting then only wrapped in kerlix secondary to cold toes noted on this date. Will see how paitent tolerates decreasedc compression and change in dressing. If rash continues wiht contact MD. Extending POC to continue to promote wound healing and reduce risk of infection.    Wound Therapy - Functional Problem List gait and balance    Factors Delaying/Impairing Wound Healing Diabetes Mellitus;Vascular compromise;Altered sensation    Wound Therapy - Frequency 3X / week    Wound Plan continue to debride, change dressings, and reduce LE edema to promote wound healing. Follow up with juxta as he can go ahead and get this and use over his wound dressings.    Dressing  santyl on gauze, hydrogel, vaseline, SOFT lymph netting, kerlix, netting #5                     PT Short Term Goals - 02/25/21 1502      PT SHORT TERM GOAL #1   Title Patient will have at least 50% granualtion tissue.    Time 3    Period Weeks    Status Achieved    Target Date 12/25/20             PT Long Term Goals - 01/03/21 1344      PT LONG TERM GOAL #1   Title Patient's wound will be healed to reduce the risk of infection.    Time 6    Period Weeks    Status On-going      PT LONG TERM GOAL #2   Title Patient will be able to verbalize completion of daily skin checks to reduce the risk of further wound development.    Time 6    Period Weeks    Status On-going                 Plan - 02/25/21 1358    Clinical Impression Statement see above    Personal Factors and Comorbidities Age;Comorbidity 3+;Past/Current Experience;Fitness;Time since onset of injury/illness/exacerbation  Comorbidities DM, CKD, neuorpathy, Hx CVA    Examination-Activity Limitations Locomotion Level;Transfers;Squat;Stand;Dressing    Examination-Participation Restrictions Community Activity    Stability/Clinical Decision Making Stable/Uncomplicated    Rehab Potential Good    PT Frequency 3x / week    PT Duration 6 weeks   increased time secondary to slow healing wound   PT Treatment/Interventions Gait training;Stair training;Functional mobility training;DME Instruction;Therapeutic activities;Therapeutic exercise;Balance training;Neuromuscular re-education;Patient/family education;Compression bandaging;Manual lymph drainage;Manual techniques    PT Next Visit Plan Continue with both chemical and mechanical debridement to remove adherent eschar to allow healing to occure    Consulted and Agree with Plan of Care Patient;Family member/caregiver    Family Member Consulted wife           Patient will benefit from skilled therapeutic intervention in order to improve the following deficits and impairments:  Abnormal gait,Decreased skin integrity,Decreased mobility,Impaired sensation  Visit Diagnosis: Ulcer of right pretibial region, limited to breakdown of skin ([place]) - Plan: PT plan of care cert/re-cert  Other abnormalities of gait and mobility - Plan: PT plan of care cert/re-cert     Problem List Patient Active Problem List   Diagnosis Date Noted  . Advanced nonexudative age-related macular degeneration of right eye without subfoveal involvement 11/15/2020  . Advanced nonexudative age-related macular degeneration of left eye without subfoveal involvement 11/15/2020  . Diabetes mellitus without complication ([place]) 48/54/6270  . Posterior vitreous detachment of both eyes 11/15/2020  . Gait disturbance, post-stroke 11/02/2017  . Aphasia as late effect of cerebrovascular accident 11/02/2017  . 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 Aphasia   . PAF (paroxysmal atrial fibrillation)  ([place])   . Diabetes mellitus type 2 in obese ([place])   . Benign essential HTN   . Hypothyroidism   . Hyperlipidemia   . Stroke (cerebrum) ([place]) 10/02/2017  . Afib ([place]) 01/10/2017  . SOB (shortness of breath) 01/10/2017  . Acute diastolic CHF (congestive heart failure) ([place]) 01/10/2017  . Atrial fibrillation with RVR ([place]) 01/10/2017  . Claudication ([place]) 07/25/2016  . Paroxysmal atrial fibrillation ([place]) 07/25/2016  . Odynophagia 04/10/2014  . Allergic rhinitis 03/23/2014  . Chronic rhinitis 12/22/2013  . 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 Constipation 08/22/2011  . Obesity 09/20/2009  . G E R D 11/29/2007  . Celiac disease 11/29/2007   3:02 PM, 02/25/21 Jerene Pitch, DPT Physical Therapy with [facility]  [phone] office  [place] [address] [place], [place], [address] Phone: [phone]   Fax:  [phone]  Name: [name] MRN: [id number] Date of Birth: 01/07/1929

## 2021-02-26 ENCOUNTER — Ambulatory Visit (HOSPITAL_COMMUNITY): Payer: PPO | Admitting: Physical Therapy

## 2021-02-27 ENCOUNTER — Other Ambulatory Visit: Payer: Self-pay

## 2021-02-27 ENCOUNTER — Encounter: Payer: Self-pay | Admitting: Cardiology

## 2021-02-27 ENCOUNTER — Ambulatory Visit (HOSPITAL_COMMUNITY): Payer: PPO | Admitting: Physical Therapy

## 2021-02-27 ENCOUNTER — Ambulatory Visit: Payer: PPO | Admitting: Cardiology

## 2021-02-27 VITALS — BP 128/50 | HR 53 | Ht 70.0 in | Wt 200.0 lb

## 2021-02-27 DIAGNOSIS — L97811 Non-pressure chronic ulcer of other part of right lower leg limited to breakdown of skin: Secondary | ICD-10-CM | POA: Diagnosis not present

## 2021-02-27 DIAGNOSIS — I350 Nonrheumatic aortic (valve) stenosis: Secondary | ICD-10-CM | POA: Diagnosis not present

## 2021-02-27 DIAGNOSIS — N1832 Chronic kidney disease, stage 3b: Secondary | ICD-10-CM

## 2021-02-27 DIAGNOSIS — R2689 Other abnormalities of gait and mobility: Secondary | ICD-10-CM

## 2021-02-27 DIAGNOSIS — D6869 Other thrombophilia: Secondary | ICD-10-CM

## 2021-02-27 DIAGNOSIS — I480 Paroxysmal atrial fibrillation: Secondary | ICD-10-CM | POA: Diagnosis not present

## 2021-02-27 MED ORDER — METOPROLOL TARTRATE 25 MG PO TABS: 50.0000 mg | ORAL_TABLET | Freq: Two times a day (BID) | ORAL | 6 refills | Status: DC

## 2021-02-27 NOTE — Therapy (Signed)
[place], [place], [address] Phone: [phone]   Fax:  [phone]  Wound Care Therapy  Patient Details  Name: [name] MRN: [id number] Date of Birth: 1929/01/11 Referring Provider (PT): [name]   Encounter Date: 02/27/2021   PT End of Session - 02/27/21 1710    Visit Number 31    Number of Visits 20    Date for PT Re-Evaluation 04/08/21    Authorization Type [employer] (no auth req, no visit limit)    Progress Note Due on Visit 40    PT Start Time 1618    PT Stop Time 1645    PT Time Calculation (min) 27 min    Activity Tolerance Patient tolerated treatment well    Behavior During Therapy [facility] for tasks assessed/performed           Past Medical History:  Diagnosis Date  . Allergic rhinitis   . Asthmatic bronchitis   . Atrial fibrillation ([place])   . Celiac disease   . Cellulitis   . CKD (chronic kidney disease)   . Diabetes ([place])   . Diverticulosis   . Fx. left wrist 1987  . Gastric polyps   . GERD (gastroesophageal reflux disease)   . History of pneumonia   . Hypertension   . Hypothyroidism   . Iron deficiency anemia   . Melanoma ([place])   . Neuropathy   . PAF (paroxysmal atrial fibrillation) ([place])   . Prostate cancer ([place])   . REM sleep behavior disorder   . RLS (restless legs syndrome)   . Sleep apnea   . Spasticity   . Stroke ([place]) 09/2017  . Tremor   . Type 2 diabetes mellitus ([place])     Past Surgical History:  Procedure Laterality Date  . APPENDECTOMY  1978  . CARPAL TUNNEL RELEASE Left   . CATARACT EXTRACTION  03/2011   bilateral   . CHOLECYSTECTOMY  2001  . COLONOSCOPY W/ BIOPSIES  04/27/2008   diverticulosis, duodenitis  . FOOT SURGERY    . HERNIA REPAIR  1994   bilateral  . KNEE SURGERY Bilateral    x 2  . LUNG SURGERY  2001   for infection  . PROSTATECTOMY  2002  . UPPER GASTROINTESTINAL ENDOSCOPY  04/27/2008   celiac disease, gastric polyps     There were no vitals filed for this visit.               Wound Therapy - 02/27/21 1705    Subjective pt states he went to his cardiologist today.  STates his leg has felt fine since last visit.    Patient and Family Stated Goals wound to heal    Date of Onset 09/06/20    Prior Treatments MD and self care    Pain Scale 0-10    Pain Score 0-No pain    Evaluation and Treatment Procedures Explained to Patient/Family Yes    Evaluation and Treatment Procedures agreed to    Wound Properties Date First Assessed: 12/04/20 Time First Assessed: 5366 Wound Type: Venous stasis ulcer Location: Leg Location Orientation: Right Present on Admission: Yes   Dressing Type Gauze (Comment)   santyl   Dressing Changed Changed    Dressing Status Old drainage    Dressing Change Frequency PRN    Site / Wound Assessment Granulation tissue    % Wound base Red or Granulating 50%    % Wound base Yellow/Fibrinous Exudate 50%  Peri-wound Assessment Erythema (blanchable)    Margins Unattached edges (unapproximated)    Drainage Amount Minimal    Drainage Description Serosanguineous    Treatment Debridement (Selective)    Selective Debridement - Location entire wound bed    Selective Debridement - Tools Used Scalpel    Selective Debridement - Tissue Removed slough    Wound Therapy - Clinical Statement Noted improvement in rash this session however perimeter remains red/dry and with increased edema noted this session.  Debrided wound with no further granulation noted.  Moisturized LE well with lotion and vaseline prior to rebandaging.  Used cotton around ankle to conform shape of limb as well as the #10 soft lymph netting prior to using kerlix and coban.  #5 wound netting was placed over the coban layer.  pt reporte overall comfort.    Wound Therapy - Functional Problem List gait and balance    Factors Delaying/Impairing Wound Healing Diabetes Mellitus;Vascular compromise;Altered sensation    Wound  Therapy - Frequency 3X / week    Wound Plan continue to debride, change dressings, and reduce LE edema to promote wound healing. Follow up with juxta as he can go ahead and get this and use over his wound dressings.    Dressing  santyl on gauze, lotion, vaseline, SOFT lymph netting #10, kerlix,coban,  netting #5                     PT Short Term Goals - 02/25/21 1502      PT SHORT TERM GOAL #1   Title Patient will have at least 50% granualtion tissue.    Time 3    Period Weeks    Status Achieved    Target Date 12/25/20             PT Long Term Goals - 01/03/21 1344      PT LONG TERM GOAL #1   Title Patient's wound will be healed to reduce the risk of infection.    Time 6    Period Weeks    Status On-going      PT LONG TERM GOAL #2   Title Patient will be able to verbalize completion of daily skin checks to reduce the risk of further wound development.    Time 6    Period Weeks    Status On-going                  Patient will benefit from skilled therapeutic intervention in order to improve the following deficits and impairments:     Visit Diagnosis: Other abnormalities of gait and mobility  Ulcer of right pretibial region, limited to breakdown of skin [facility])     Problem List Patient Active Problem List   Diagnosis Date Noted  . Advanced nonexudative age-related macular degeneration of right eye without subfoveal involvement 11/15/2020  . Advanced nonexudative age-related macular degeneration of left eye without subfoveal involvement 11/15/2020  . Diabetes mellitus without complication ([place]) 34/91/7915  . Posterior vitreous detachment of both eyes 11/15/2020  . Gait disturbance, post-stroke 11/02/2017  . Aphasia as late effect of cerebrovascular accident 11/02/2017  . 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 Aphasia   . PAF (paroxysmal atrial fibrillation) ([place])   . Diabetes mellitus type 2 in obese ([place])   . Benign essential  HTN   . Hypothyroidism   . Hyperlipidemia   . Stroke (cerebrum) ([place]) 10/02/2017  . Afib ([place]) 01/10/2017  . SOB (shortness of breath) 01/10/2017  .  Acute diastolic CHF (congestive heart failure) ([place]) 01/10/2017  . Atrial fibrillation with RVR ([place]) 01/10/2017  . Claudication ([place]) 07/25/2016  . Paroxysmal atrial fibrillation ([place]) 07/25/2016  . Odynophagia 04/10/2014  . Allergic rhinitis 03/23/2014  . Chronic rhinitis 12/22/2013  . 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 Constipation 08/22/2011  . Obesity 09/20/2009  . G E R D 11/29/2007  . Celiac disease 11/29/2007   Teena Irani, PTA/CLT [phone]  Teena Irani 02/27/2021, 5:14 PM  [place] [address] [place], [place], [address] Phone: [phone]   Fax:  [phone]  Name: [name] MRN: [id number] Date of Birth: 01/11/1929

## 2021-02-27 NOTE — Patient Instructions (Addendum)
Medication Instructions:   Your physician has recommended you make the following change in your medication:   Decrease metoprolol tartrate to 50 mg by mouth twice daily (2 of your 25 mg tablets twice daily)  Continue other medications the same  Labwork:  none  Testing/Procedures: Your physician has requested that you have an echocardiogram in 3 months just before your next visit in August 2022. Echocardiography is a painless test that uses sound waves to create images of your heart. It provides your doctor with information about the size and shape of your heart and how well your heart's chambers and valves are working. This procedure takes approximately one hour. There are no restrictions for this procedure.  Follow-Up:  Your physician recommends that you schedule a follow-up appointment in: 3 months.  Any Other Special Instructions Will Be Listed Below (If Applicable).  If you need a refill on your cardiac medications before your next appointment, please call your pharmacy.

## 2021-02-27 NOTE — Progress Notes (Signed)
Cardiology Office Note  Date: 02/27/2021   ID: [name], [name] Jul 21, 1929, MRN [id number]  PCP:  Celene Squibb, MD  Cardiologist:  Rozann Lesches, MD Electrophysiologist:  None   Chief Complaint  Patient presents with  . Cardiac follow-up    History of Present Illness: [name] is an 87 y.o. male last seen in November 2021.  He is here today with his wife for a follow-up visit.  Reports no exertional chest pain or palpitations with low-level activity.  Uses a walker, no recent falls.  Follow-up echocardiogram in December 2021 revealed LVEF 65 to 70%, normal RV contraction, and moderate to severe aortic stenosis with mean gradient 28 mmHg and dimensionless index 0.25.  We discussed these results today.  I reviewed his recent lab work as noted below.  He does not report any bleeding problems on Eliquis which is appropriately dosed.  Heart rate in the 50s today, reportedly around 60 at home.  We discussed reducing his Lopressor to 50 mg twice daily.  Past Medical History:  Diagnosis Date  . Allergic rhinitis   . Asthmatic bronchitis   . Atrial fibrillation ([place])   . Celiac disease   . Cellulitis   . CKD (chronic kidney disease)   . Diabetes ([place])   . Diverticulosis   . Fx. left wrist 1987  . Gastric polyps   . GERD (gastroesophageal reflux disease)   . History of pneumonia   . Hypertension   . Hypothyroidism   . Iron deficiency anemia   . Melanoma ([place])   . Neuropathy   . PAF (paroxysmal atrial fibrillation) ([place])   . Prostate cancer ([place])   . REM sleep behavior disorder   . RLS (restless legs syndrome)   . Sleep apnea   . Spasticity   . Stroke ([place]) 09/2017  . Tremor   . Type 2 diabetes mellitus ([place])     Past Surgical History:  Procedure Laterality Date  . APPENDECTOMY  1978  . CARPAL TUNNEL RELEASE Left   . CATARACT EXTRACTION  03/2011   bilateral   . CHOLECYSTECTOMY  2001  . COLONOSCOPY W/ BIOPSIES  04/27/2008   diverticulosis, duodenitis   . FOOT SURGERY    . HERNIA REPAIR  1994   bilateral  . KNEE SURGERY Bilateral    x 2  . LUNG SURGERY  2001   for infection  . PROSTATECTOMY  2002  . UPPER GASTROINTESTINAL ENDOSCOPY  04/27/2008   celiac disease, gastric polyps    Current Outpatient Medications  Medication Sig Dispense Refill  . acetaminophen (TYLENOL) 325 MG tablet Take 650 mg by mouth every 6 (six) hours as needed. Takes 2 once daily    . amitriptyline (ELAVIL) 50 MG tablet Take 50 mg by mouth at bedtime.    Marland Kitchen apixaban (ELIQUIS) 2.5 MG TABS tablet Take 1 tablet (2.5 mg total) by mouth 2 (two) times daily. 60 tablet 6  . Ascorbic Acid (VITAMIN C) 1000 MG tablet Take 1,000 mg by mouth 2 (two) times daily.    Marland Kitchen atorvastatin (LIPITOR) 20 MG tablet Take 20 mg by mouth daily.    Marland Kitchen azelastine (ASTELIN) 0.1 % nasal spray Place 1 spray into both nostrils at bedtime.     . calcitRIOL (ROCALTROL) 0.25 MCG capsule Take 0.25 mcg by mouth daily.    . Cholecalciferol (VITAMIN D3) 1000 UNITS CAPS Take 1,000 Units by mouth daily.    . diclofenac sodium (VOLTAREN) 1 % GEL Apply 2 g topically  4 (four) times daily. 1 Tube 0  . empagliflozin (JARDIANCE) 10 MG TABS tablet Take 10 mg by mouth daily.    Marland Kitchen Fexofenadine HCl (MUCINEX ALLERGY PO) Take 1 tablet by mouth daily.    . fluticasone (FLONASE) 50 MCG/ACT nasal spray Place 1 spray into both nostrils daily.     . furosemide (LASIX) 20 MG tablet Take 20 mg by mouth. 80mg  in the morning and 60 mg in the afternoon    . Ginger, Zingiber officinalis, (GINGER ROOT) 550 MG CAPS Take by mouth. 2 daily    . glipiZIDE (GLUCOTROL XL) 10 MG 24 hr tablet 10 mg.    . hydrALAZINE (APRESOLINE) 100 MG tablet Take 1 tablet (100 mg total) by mouth 3 (three) times daily. 180 tablet 3  . JANUVIA 100 MG tablet 100 mg daily. morning    . levocetirizine (XYZAL) 5 MG tablet Take 5 mg by mouth at bedtime.     Marland Kitchen levothyroxine (SYNTHROID, LEVOTHROID) 150 MCG tablet Take 1 tablet (150 mcg total) by mouth daily  before breakfast. 30 tablet 0  . MAGNESIUM MALATE PO Take 500 mg by mouth daily.    . Melatonin 3 MG TABS Take 3 mg by mouth at bedtime.    . Misc Natural Products (TART CHERRY ADVANCED PO) Take 425 mg by mouth. 2 daily    . Omega-3 Fatty Acids (FISH OIL) 1000 MG CAPS Take by mouth daily.    . pantoprazole (PROTONIX) 40 MG tablet Take 1 tablet (40 mg total) by mouth daily. 30 tablet 0  . polyethylene glycol (MIRALAX / GLYCOLAX) 17 g packet Take 17 g by mouth as needed.    Marland Kitchen tiZANidine (ZANAFLEX) 2 MG tablet     . metoprolol tartrate (LOPRESSOR) 25 MG tablet Take 2 tablets (50 mg total) by mouth 2 (two) times daily. 120 tablet 6   No current facility-administered medications for this visit.   Allergies:  Clindamycin, Gluten meal, Hydrocodone, Penicillins, Procaine, Sulfonamide derivatives, Tape, Wheat bran, Cephalexin, and Mold extract [trichophyton]   ROS: No dizziness or syncope.  Physical Exam: VS:  BP (!) 128/50   Pulse (!) 53   Ht 5\' 10"  (1.778 m)   Wt 200 lb (90.7 kg)   SpO2 93%   BMI 28.70 kg/m , BMI Body mass index is 28.7 kg/m.  Wt Readings from Last 3 Encounters:  02/27/21 200 lb (90.7 kg)  08/30/20 212 lb (96.2 kg)  01/05/20 215 lb (97.5 kg)    General: Elderly male, appears comfortable at rest. HEENT: Conjunctiva and lids normal, wearing a mask. Neck: Supple, no elevated JVP or carotid bruits, no thyromegaly. Lungs: Clear to auscultation, nonlabored breathing at rest. Cardiac: Regular rate and rhythm, no S3, 3/6 systolic murmur, no pericardial rub. Extremities: Mild ankle pitting edema.  ECG:  An ECG dated 08/30/2020 was personally reviewed today and demonstrated:  Sinus rhythm with prolonged PR interval, right bundle branch block and leftward axis.  Recent Labwork:  April 2022: Hemoglobin 10.4, platelets 333, BUN 58, creatinine 1.87, potassium 4.7, AST 19, ALT 14, cholesterol 111, triglycerides 100, HDL 39, LDL 53, hemoglobin A1c 8%, TSH 1.81  Other Studies  Reviewed Today:  Echocardiogram 09/13/2020: 1. Left ventricular ejection fraction, by estimation, is 65 to 70%. The  left ventricle has normal function. The left ventricle has no regional  wall motion abnormalities. There is mild left ventricular hypertrophy.  Left ventricular diastolic parameters  are indeterminate.  2. Right ventricular systolic function is normal. The right ventricular  size is normal. Tricuspid regurgitation signal is inadequate for assessing  PA pressure.  3. The mitral valve is grossly normal, mildly calcified. Trivial mitral  valve regurgitation.  4. The aortic valve is tricuspid. There is moderate calcification of the  aortic valve. Aortic valve regurgitation is mild. Moderate to severe  aortic valve stenosis. Aortic valve mean gradient measures 27.7 mmHg.  Aortic valve Vmax measures 3.28 m/s.  Dimentionless index 0.25.  5. The inferior vena cava is normal in size with greater than 50%  respiratory variability, suggesting right atrial pressure of 3 mmHg.  Assessment and Plan:  1.  Paroxysmal atrial fibrillation with CHA2DS2-VASc score of 5.  He is asymptomatic in terms of palpitations and remains on Eliquis for stroke prophylaxis.  Reducing Lopressor to 50 mg twice daily to avoid symptomatic bradycardia.  2.  Acquired thrombophilia, on Eliquis for stroke prophylaxis which is appropriately dosed based on age and renal function.  No reported bleeding problems.  3.  Moderate to severe calcific aortic stenosis.  We will plan follow-up echocardiogram just prior to his next visit.  Medication Adjustments/Labs and Tests Ordered: Current medicines are reviewed at length with the patient today.  Concerns regarding medicines are outlined above.   Tests Ordered: Orders Placed This Encounter  Procedures  . ECHOCARDIOGRAM COMPLETE    Medication Changes: Meds ordered this encounter  Medications  . metoprolol tartrate (LOPRESSOR) 25 MG tablet    Sig: Take 2  tablets (50 mg total) by mouth 2 (two) times daily.    Dispense:  120 tablet    Refill:  6    02/27/2021 dose decrease    Disposition:  Follow up 3 months.  Signed, Satira Sark, MD, [facility] [place] [facility] 02/27/2021 2:05 PM    [facility] [facility] [place], [place], [place] [address] Phone: [phone]; Fax: [phone]

## 2021-02-28 ENCOUNTER — Ambulatory Visit (HOSPITAL_COMMUNITY): Payer: PPO | Admitting: Physical Therapy

## 2021-03-01 ENCOUNTER — Other Ambulatory Visit: Payer: Self-pay

## 2021-03-01 ENCOUNTER — Ambulatory Visit (HOSPITAL_COMMUNITY): Payer: PPO

## 2021-03-01 ENCOUNTER — Encounter (HOSPITAL_COMMUNITY): Payer: Self-pay

## 2021-03-01 DIAGNOSIS — L97811 Non-pressure chronic ulcer of other part of right lower leg limited to breakdown of skin: Secondary | ICD-10-CM

## 2021-03-01 DIAGNOSIS — R2689 Other abnormalities of gait and mobility: Secondary | ICD-10-CM

## 2021-03-01 NOTE — Therapy (Signed)
[place], [place], [address] Phone: [phone]   Fax:  [phone]  Wound Care Therapy  Patient Details  Name: [name] MRN: [id number] Date of Birth: 19-Jan-1929 Referring Provider (PT): [name]   Encounter Date: 03/01/2021   PT End of Session - 03/01/21 1846    Visit Number 32    Number of Visits 48    Date for PT Re-Evaluation 04/08/21    Authorization Type [employer] (no auth req, no visit limit)    Progress Note Due on Visit 40    PT Start Time 1745    PT Stop Time 1830    PT Time Calculation (min) 45 min    Activity Tolerance Patient tolerated treatment well    Behavior During Therapy [facility] for tasks assessed/performed           Past Medical History:  Diagnosis Date  . Allergic rhinitis   . Asthmatic bronchitis   . Atrial fibrillation ([place])   . Celiac disease   . Cellulitis   . CKD (chronic kidney disease)   . Diabetes ([place])   . Diverticulosis   . Fx. left wrist 1987  . Gastric polyps   . GERD (gastroesophageal reflux disease)   . History of pneumonia   . Hypertension   . Hypothyroidism   . Iron deficiency anemia   . Melanoma ([place])   . Neuropathy   . PAF (paroxysmal atrial fibrillation) ([place])   . Prostate cancer ([place])   . REM sleep behavior disorder   . RLS (restless legs syndrome)   . Sleep apnea   . Spasticity   . Stroke ([place]) 09/2017  . Tremor   . Type 2 diabetes mellitus ([place])     Past Surgical History:  Procedure Laterality Date  . APPENDECTOMY  1978  . CARPAL TUNNEL RELEASE Left   . CATARACT EXTRACTION  03/2011   bilateral   . CHOLECYSTECTOMY  2001  . COLONOSCOPY W/ BIOPSIES  04/27/2008   diverticulosis, duodenitis  . FOOT SURGERY    . HERNIA REPAIR  1994   bilateral  . KNEE SURGERY Bilateral    x 2  . LUNG SURGERY  2001   for infection  . PROSTATECTOMY  2002  . UPPER GASTROINTESTINAL ENDOSCOPY  04/27/2008   celiac disease, gastric polyps     There were no vitals filed for this visit.    Subjective Assessment - 03/01/21 1839    Subjective Feeling good, no reports of pain.  Dressings intact.                     Wound Therapy - 03/01/21 0001    Subjective Feeling good, no reports of pain.  Dressings intact.    Patient and Family Stated Goals wound to heal    Date of Onset 09/06/20    Prior Treatments MD and self care    Pain Scale 0-10    Pain Score 0-No pain    Evaluation and Treatment Procedures Explained to Patient/Family Yes    Evaluation and Treatment Procedures agreed to    Wound Properties Date First Assessed: 12/04/20 Time First Assessed: 5465 Wound Type: Venous stasis ulcer Location: Leg Location Orientation: Right Present on Admission: Yes   Wound Image View All Images View Images    Dressing Type --   Santyl, gauze, Soft lymph liner G, cotton, kerlix, coban, netting #5   Dressing Changed Changed    Dressing Status Old drainage  Dressing Change Frequency PRN    Site / Wound Assessment Granulation tissue    % Wound base Red or Granulating 50%    % Wound base Yellow/Fibrinous Exudate 50%    Peri-wound Assessment Erythema (blanchable)    Wound Length (cm) 3.2 cm    Wound Width (cm) 2.5 cm    Wound Depth (cm) 0.5 cm    Wound Volume (cm^3) 4 cm^3    Wound Surface Area (cm^2) 8 cm^2    Margins Unattached edges (unapproximated)    Drainage Amount Minimal    Drainage Description Serosanguineous    Treatment Cleansed;Debridement (Selective)    Selective Debridement - Location entire wound bed    Selective Debridement - Tools Used Scalpel    Selective Debridement - Tissue Removed slough    Wound Therapy - Clinical Statement Minimal rash anterior shin, continues to have redness and dry skin with 4 small red openings.  Selective debridement for removal of slough from wound bed that continues to be adherent to wound bed.  Moisturized skin wiht vaseline perimeter of wound and lotion on whole leg.   Continued with soft lymph liner followed by cotton, kerlix and coban with #5 netting on top.    Wound Therapy - Functional Problem List gait and balance    Factors Delaying/Impairing Wound Healing Diabetes Mellitus;Vascular compromise;Altered sensation    Wound Therapy - Frequency 3X / week    Wound Plan continue to debride, change dressings, and reduce LE edema to promote wound healing. Follow up with juxta as he can go ahead and get this and use over his wound dressings.    Dressing  Santyl, gauze, Soft lymph liner G, cotton, kerlix, coban, netting #5                     PT Short Term Goals - 02/25/21 1502      PT SHORT TERM GOAL #1   Title Patient will have at least 50% granualtion tissue.    Time 3    Period Weeks    Status Achieved    Target Date 12/25/20             PT Long Term Goals - 01/03/21 1344      PT LONG TERM GOAL #1   Title Patient's wound will be healed to reduce the risk of infection.    Time 6    Period Weeks    Status On-going      PT LONG TERM GOAL #2   Title Patient will be able to verbalize completion of daily skin checks to reduce the risk of further wound development.    Time 6    Period Weeks    Status On-going                  Patient will benefit from skilled therapeutic intervention in order to improve the following deficits and impairments:     Visit Diagnosis: Ulcer of right pretibial region, limited to breakdown of skin (HCC)  Other abnormalities of gait and mobility     Problem List Patient Active Problem List   Diagnosis Date Noted  . Advanced nonexudative age-related macular degeneration of right eye without subfoveal involvement 11/15/2020  . Advanced nonexudative age-related macular degeneration of left eye without subfoveal involvement 11/15/2020  . Diabetes mellitus without complication ([place]) 92/08/9416  . Posterior vitreous detachment of both eyes 11/15/2020  . Gait disturbance, post-stroke 11/02/2017   . Aphasia as late effect of cerebrovascular accident 11/02/2017  .  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 Aphasia   . PAF (paroxysmal atrial fibrillation) ([place])   . Diabetes mellitus type 2 in obese ([place])   . Benign essential HTN   . Hypothyroidism   . Hyperlipidemia   . Stroke (cerebrum) ([place]) 10/02/2017  . Afib ([place]) 01/10/2017  . SOB (shortness of breath) 01/10/2017  . Acute diastolic CHF (congestive heart failure) ([place]) 01/10/2017  . Atrial fibrillation with RVR ([place]) 01/10/2017  . Claudication ([place]) 07/25/2016  . Paroxysmal atrial fibrillation ([place]) 07/25/2016  . Odynophagia 04/10/2014  . Allergic rhinitis 03/23/2014  . Chronic rhinitis 12/22/2013  . 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 Constipation 08/22/2011  . Obesity 09/20/2009  . G E R D 11/29/2007  . Celiac disease 11/29/2007   Ihor Austin, LPTA/CLT; CBIS [phone]  Aldona Lento 03/01/2021, 6:48 PM  [facility] [address] [place], [place], [address] Phone: [phone]   Fax:  [phone]  Name: [name] MRN: [id number] Date of Birth: 26-Apr-1929

## 2021-03-04 ENCOUNTER — Other Ambulatory Visit: Payer: Self-pay

## 2021-03-04 ENCOUNTER — Ambulatory Visit (HOSPITAL_COMMUNITY): Payer: PPO | Admitting: Physical Therapy

## 2021-03-04 DIAGNOSIS — L97811 Non-pressure chronic ulcer of other part of right lower leg limited to breakdown of skin: Secondary | ICD-10-CM

## 2021-03-04 DIAGNOSIS — R2689 Other abnormalities of gait and mobility: Secondary | ICD-10-CM

## 2021-03-04 NOTE — Therapy (Signed)
[place], [place], [address] Phone: [phone]   Fax:  [phone]  Wound Care Therapy  Patient Details  Name: [name] MRN: [id number] Date of Birth: [birth year] Referring Provider (PT): [name]   Encounter Date: 03/04/2021   PT End of Session - 03/04/21 1455    Visit Number 33    Number of Visits 7    Date for PT Re-Evaluation 04/08/21    Authorization Type [employer] (no auth req, no visit limit)    Progress Note Due on Visit 40    PT Start Time 1315    PT Stop Time 1350    PT Time Calculation (min) 35 min    Activity Tolerance Patient tolerated treatment well    Behavior During Therapy [facility] for tasks assessed/performed           Past Medical History:  Diagnosis Date  . Allergic rhinitis   . Asthmatic bronchitis   . Atrial fibrillation ([place])   . Celiac disease   . Cellulitis   . CKD (chronic kidney disease)   . Diabetes ([place])   . Diverticulosis   . Fx. left wrist 1987  . Gastric polyps   . GERD (gastroesophageal reflux disease)   . History of pneumonia   . Hypertension   . Hypothyroidism   . Iron deficiency anemia   . Melanoma ([place])   . Neuropathy   . PAF (paroxysmal atrial fibrillation) ([place])   . Prostate cancer ([place])   . REM sleep behavior disorder   . RLS (restless legs syndrome)   . Sleep apnea   . Spasticity   . Stroke ([place]) 09/2017  . Tremor   . Type 2 diabetes mellitus ([place])     Past Surgical History:  Procedure Laterality Date  . APPENDECTOMY  1978  . CARPAL TUNNEL RELEASE Left   . CATARACT EXTRACTION  03/2011   bilateral   . CHOLECYSTECTOMY  2001  . COLONOSCOPY W/ BIOPSIES  04/27/2008   diverticulosis, duodenitis  . FOOT SURGERY    . HERNIA REPAIR  1994   bilateral  . KNEE SURGERY Bilateral    x 2  . LUNG SURGERY  2001   for infection  . PROSTATECTOMY  2002  . UPPER GASTROINTESTINAL ENDOSCOPY  04/27/2008   celiac disease, gastric polyps     There were no vitals filed for this visit.    Subjective Assessment - 03/04/21 1443    Subjective Pt states that he is not having any pain                     Wound Therapy - 03/04/21 0001    Subjective Feeling good, no reports of pain.  Dressings intact.    Patient and Family Stated Goals wound to heal    Date of Onset 09/06/20    Prior Treatments MD and self care    Pain Scale 0-10    Pain Score 0-No pain    Evaluation and Treatment Procedures Explained to Patient/Family Yes    Evaluation and Treatment Procedures agreed to    Wound Properties Date First Assessed: 12/04/20 Time First Assessed: 6063 Wound Type: Venous stasis ulcer Location: Leg Location Orientation: Right Present on Admission: Yes   Dressing Changed Changed    Dressing Status Old drainage    Dressing Change Frequency PRN    Site / Wound Assessment Granulation tissue    % Wound base Red or Granulating 90%  following debridement   % Wound base Yellow/Fibrinous Exudate 10%    Margins Attached edges (approximated)    Drainage Amount Minimal    Drainage Description Serosanguineous    Treatment Cleansed;Debridement (Selective)    Selective Debridement - Location entire wound bed    Selective Debridement - Tools Used Scalpel;Forceps    Selective Debridement - Tissue Removed slough    Wound Therapy - Clinical Statement Minimal rash noted at superior edge of bandage.  Wound is almost 100% granulated so trial of medihoney rather than santyl.  If wound continues to be close to 100% granulated may try xeroform next visit instead of medihoney.    Wound Therapy - Functional Problem List gait and balance    Factors Delaying/Impairing Wound Healing Diabetes Mellitus;Vascular compromise;Altered sensation    Wound Therapy - Frequency 3X / week    Wound Plan Continue wound care if wound continues to be 100% granulated and begins to fill in see if family want to begin self care or continue skilled PT>    Dressing   medihoney, gauze, Soft lymph liner G, cotton, kerlix, coban,                     PT Short Term Goals - 02/25/21 1502      PT SHORT TERM GOAL #1   Title Patient will have at least 50% granualtion tissue.    Time 3    Period Weeks    Status Achieved    Target Date 12/25/20             PT Long Term Goals - 01/03/21 1344      PT LONG TERM GOAL #1   Title Patient's wound will be healed to reduce the risk of infection.    Time 6    Period Weeks    Status On-going      PT LONG TERM GOAL #2   Title Patient will be able to verbalize completion of daily skin checks to reduce the risk of further wound development.    Time 6    Period Weeks    Status On-going                  Patient will benefit from skilled therapeutic intervention in order to improve the following deficits and impairments:     Visit Diagnosis: Ulcer of right pretibial region, limited to breakdown of skin (HCC)  Other abnormalities of gait and mobility     Problem List Patient Active Problem List   Diagnosis Date Noted  . Advanced nonexudative age-related macular degeneration of right eye without subfoveal involvement 11/15/2020  . Advanced nonexudative age-related macular degeneration of left eye without subfoveal involvement 11/15/2020  . Diabetes mellitus without complication ([place]) 78/46/9629  . Posterior vitreous detachment of both eyes 11/15/2020  . Gait disturbance, post-stroke 11/02/2017  . Aphasia as late effect of cerebrovascular accident 11/02/2017  . 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 Aphasia   . PAF (paroxysmal atrial fibrillation) ([place])   . Diabetes mellitus type 2 in obese ([place])   . Benign essential HTN   . Hypothyroidism   . Hyperlipidemia   . Stroke (cerebrum) ([place]) 10/02/2017  . Afib ([place]) 01/10/2017  . SOB (shortness of breath) 01/10/2017  . Acute diastolic CHF (congestive heart failure) ([place]) 01/10/2017  . Atrial fibrillation with RVR  ([place]) 01/10/2017  . Claudication ([place]) 07/25/2016  . Paroxysmal atrial fibrillation ([place]) 07/25/2016  . Odynophagia 04/10/2014  . Allergic rhinitis 03/23/2014  .  Chronic rhinitis 12/22/2013  . 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 Constipation 08/22/2011  . Obesity 09/20/2009  . G E R D 11/29/2007  . Celiac disease 11/29/2007    Rayetta Humphrey, PT CLT [phone] 03/04/2021, 2:56 PM  [facility] [address] [place], [place], [address] Phone: [phone]   Fax:  [phone]  Name: [name] MRN: [id number] Date of Birth: 04-30-1929

## 2021-03-05 ENCOUNTER — Ambulatory Visit (HOSPITAL_COMMUNITY): Payer: PPO | Admitting: Physical Therapy

## 2021-03-05 ENCOUNTER — Ambulatory Visit (INDEPENDENT_AMBULATORY_CARE_PROVIDER_SITE_OTHER): Payer: PPO | Admitting: Ophthalmology

## 2021-03-05 ENCOUNTER — Encounter (INDEPENDENT_AMBULATORY_CARE_PROVIDER_SITE_OTHER): Payer: Self-pay | Admitting: Ophthalmology

## 2021-03-05 DIAGNOSIS — H353113 Nonexudative age-related macular degeneration, right eye, advanced atrophic without subfoveal involvement: Secondary | ICD-10-CM

## 2021-03-05 DIAGNOSIS — H353123 Nonexudative age-related macular degeneration, left eye, advanced atrophic without subfoveal involvement: Secondary | ICD-10-CM | POA: Diagnosis not present

## 2021-03-05 NOTE — Assessment & Plan Note (Signed)
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 Currently, no eye medications or vitamins slow this type of aging effect upon vision, however cessation of smoking and controlling hypertension help slow the disorder. The following analogy helps explain this: I describe the dry form of ARMD like a house of the same age as your eyes, which shows typical wear and tear of age upon the house structure and appearance. Like the aging house which can fall down structurally, the dry form of ARMD can deteriorate the structure of the macula (center) of the retina, most often gradually and affect central vision tasks such as reading and driving. The wet form of ARMD refers to the development of abnormally growing blood vessels usually near or under the central vision, with potential risk of permanent visual changes or vision losses. This complication of the Dry form of ARMD may be moderately reduced by use of AREDS 2 formula multivitamins daily. I describe the Wet form of ARMD as like the development of a fire in an aging house (DRY ARMD). It may develop suddenly, progress rapidly and be destructive based on where it starts and how big the fire is when found. In the eye, the house fire analogy refers to the abnormal blood vessels growing destructively near the central vison in the retina, or film of the eye. Halting the growth of blood vessels with laser (hot or cold) or injectable medications is best way "to put the fire out". Many patients will experience a stabilization or even improvement in vision with a treatment course, while others may still face a loss of vision. The use of injectable medications  has revolutionized therapy and is currently the only proven therapy to provide the chance of stable or improved acuity from new and recent destructive wet ARMD.

## 2021-03-05 NOTE — Progress Notes (Signed)
03/05/2021     CHIEF COMPLAINT Patient presents for Retina Follow Up (3 Mo F/U OU//Pt sts he cannot make out the faces of the people in the front of the church with OU. Pt c/o fluctuating [facility] OU.)   HISTORY OF PRESENT ILLNESS: [name] is a 87 y.o. male who presents to the clinic today for:   HPI    Retina Follow Up    Diagnosis: Dry AMD   Laterality: both eyes   Onset: 3 months ago   Severity: moderate   Duration: 3 months   Course: gradually worsening   Comments: 3 Mo F/U OU  Pt sts he cannot make out the faces of the people in the front of the church with OU. Pt c/o fluctuating VA OU.          Comments    Gradual change in vision with running together words while reading.       Last edited by Hurman Horn, MD on 03/05/2021  4:28 PM. (History)      Referring physician: Celene Squibb, MD [place],  [place] [address]  HISTORICAL INFORMATION:   Selected notes from the MEDICAL RECORD NUMBER    Lab Results  Component Value Date   HGBA1C 7.8 (H) 10/03/2017     CURRENT MEDICATIONS: No current outpatient medications on file. (Ophthalmic Drugs)   No current facility-administered medications for this visit. (Ophthalmic Drugs)   Current Outpatient Medications (Other)  Medication Sig  . acetaminophen (TYLENOL) 325 MG tablet Take 650 mg by mouth every 6 (six) hours as needed. Takes 2 once daily  . amitriptyline (ELAVIL) 50 MG tablet Take 50 mg by mouth at bedtime.  Marland Kitchen apixaban (ELIQUIS) 2.5 MG TABS tablet Take 1 tablet (2.5 mg total) by mouth 2 (two) times daily.  . Ascorbic Acid (VITAMIN C) 1000 MG tablet Take 1,000 mg by mouth 2 (two) times daily.  Marland Kitchen atorvastatin (LIPITOR) 20 MG tablet Take 20 mg by mouth daily.  Marland Kitchen azelastine (ASTELIN) 0.1 % nasal spray Place 1 spray into both nostrils at bedtime.   . calcitRIOL (ROCALTROL) 0.25 MCG capsule Take 0.25 mcg by mouth daily.  . Cholecalciferol (VITAMIN D3) 1000 UNITS CAPS Take 1,000 Units by mouth  daily.  . diclofenac sodium (VOLTAREN) 1 % GEL Apply 2 g topically 4 (four) times daily.  . empagliflozin (JARDIANCE) 10 MG TABS tablet Take 10 mg by mouth daily.  Marland Kitchen Fexofenadine HCl (MUCINEX ALLERGY PO) Take 1 tablet by mouth daily.  . fluticasone (FLONASE) 50 MCG/ACT nasal spray Place 1 spray into both nostrils daily.   . furosemide (LASIX) 20 MG tablet Take 20 mg by mouth. 80mg  in the morning and 60 mg in the afternoon  . Ginger, Zingiber officinalis, (GINGER ROOT) 550 MG CAPS Take by mouth. 2 daily  . glipiZIDE (GLUCOTROL XL) 10 MG 24 hr tablet 10 mg.  . hydrALAZINE (APRESOLINE) 100 MG tablet Take 1 tablet (100 mg total) by mouth 3 (three) times daily.  Marland Kitchen JANUVIA 100 MG tablet 100 mg daily. morning  . levocetirizine (XYZAL) 5 MG tablet Take 5 mg by mouth at bedtime.   Marland Kitchen levothyroxine (SYNTHROID, LEVOTHROID) 150 MCG tablet Take 1 tablet (150 mcg total) by mouth daily before breakfast.  . MAGNESIUM MALATE PO Take 500 mg by mouth daily.  . Melatonin 3 MG TABS Take 3 mg by mouth at bedtime.  . metoprolol tartrate (LOPRESSOR) 25 MG tablet Take 2 tablets (50 mg total) by  mouth 2 (two) times daily.  . Misc Natural Products (TART CHERRY ADVANCED PO) Take 425 mg by mouth. 2 daily  . Omega-3 Fatty Acids (FISH OIL) 1000 MG CAPS Take by mouth daily.  . pantoprazole (PROTONIX) 40 MG tablet Take 1 tablet (40 mg total) by mouth daily.  . polyethylene glycol (MIRALAX / GLYCOLAX) 17 g packet Take 17 g by mouth as needed.  Marland Kitchen tiZANidine (ZANAFLEX) 2 MG tablet    No current facility-administered medications for this visit. (Other)      REVIEW OF SYSTEMS:    ALLERGIES Allergies  Allergen Reactions  . Clindamycin Diarrhea  . Gluten Meal Diarrhea    No rye; no barley = HAS CELIAC DISEASE  . Hydrocodone Other (See Comments)    Caused mental status changes and suffered withdrawals when he stopped it  . Penicillins Other (See Comments)    From childhood: Has patient had a PCN reaction causing  immediate rash, facial/tongue/throat swelling, SOB or lightheadedness with hypotension: Unk Has patient had a PCN reaction causing severe rash involving mucus membranes or skin necrosis: Unk Has patient had a PCN reaction that required hospitalization: Unk Has patient had a PCN reaction occurring within the last 10 years: No If all of the above answers are "NO", then may proceed with Cephalosporin use.   . Procaine Other (See Comments)    "Passes out"  . Sulfonamide Derivatives Hives  . Tape Other (See Comments)    PLEASE USE AN ALTERNATIVE; LIKE COBAN WRAP; TAPE TEARS THE SKIN AND CAUSES BLISTERS!! PLEASE USE AN ALTERNATIVE; LIKE COBAN WRAP; TAPE TEARS THE SKIN AND CAUSES BLISTERS!!  . Wheat Bran Diarrhea    Pt family states pt has celiac disease  . Cephalexin Diarrhea and Nausea And Vomiting    Dry hives, "violent" diarhea  . Mold Extract [Trichophyton] Other (See Comments)    Stuffiness, post-nasal drip    PAST MEDICAL HISTORY Past Medical History:  Diagnosis Date  . Allergic rhinitis   . Asthmatic bronchitis   . Atrial fibrillation ([place])   . Celiac disease   . Cellulitis   . CKD (chronic kidney disease)   . Diabetes ([place])   . Diverticulosis   . Fx. left wrist 1987  . Gastric polyps   . GERD (gastroesophageal reflux disease)   . History of pneumonia   . Hypertension   . Hypothyroidism   . Iron deficiency anemia   . Melanoma ([place])   . Neuropathy   . PAF (paroxysmal atrial fibrillation) ([place])   . Prostate cancer ([place])   . REM sleep behavior disorder   . RLS (restless legs syndrome)   . Sleep apnea   . Spasticity   . Stroke ([place]) 09/2017  . Tremor   . Type 2 diabetes mellitus ([place])    Past Surgical History:  Procedure Laterality Date  . APPENDECTOMY  1978  . CARPAL TUNNEL RELEASE Left   . CATARACT EXTRACTION  03/2011   bilateral   . CHOLECYSTECTOMY  2001  . COLONOSCOPY W/ BIOPSIES  04/27/2008   diverticulosis, duodenitis  . FOOT SURGERY    . HERNIA REPAIR   1994   bilateral  . KNEE SURGERY Bilateral    x 2  . LUNG SURGERY  2001   for infection  . PROSTATECTOMY  2002  . UPPER GASTROINTESTINAL ENDOSCOPY  04/27/2008   celiac disease, gastric polyps    FAMILY HISTORY Family History  Problem Relation Age of Onset  . Breast cancer Mother   . Kidney failure  Father   . Heart disease Father   . Rheumatic fever Father   . Colon cancer Other     SOCIAL HISTORY Social History   Tobacco Use  . Smoking status: Never Smoker  . Smokeless tobacco: Never Used  Vaping Use  . Vaping Use: Never used  Substance Use Topics  . Alcohol use: Never  . Drug use: Never         OPHTHALMIC EXAM: Base Eye Exam    Visual Acuity (ETDRS)      Right Left   Dist cc 20/70 +1 20/70 -2   Dist ph cc NI 20/70 +1   Correction: Glasses       Tonometry (Tonopen, 3:06 PM)      Right Left   Pressure 10 11       Pupils      Pupils Dark Light Shape React APD   Right PERRL 4 3 Round Brisk None   Left PERRL 4 3 Round Brisk None       Visual Fields (Counting fingers)      Left Right    Full    Restrictions  Partial outer superior nasal deficiency       Extraocular Movement      Right Left    Full Full       Neuro/Psych    Oriented x3: Yes   Mood/Affect: Normal       Dilation    Both eyes: 1.0% Mydriacyl, 2.5% Phenylephrine @ 3:06 PM        Slit Lamp and Fundus Exam    External Exam      Right Left   External Normal Normal       Slit Lamp Exam      Right Left   Lids/Lashes Normal Normal   Conjunctiva/Sclera White and quiet White and quiet   Cornea Clear Clear   Anterior Chamber Deep and quiet Deep and quiet   Iris Round and reactive Round and reactive   Lens Centered posterior chamber intraocular lens Centered posterior chamber intraocular lens   Anterior Vitreous Normal Normal       Fundus Exam      Right Left   Posterior Vitreous Posterior vitreous detachment Posterior vitreous detachment   Disc Normal Normal   C/D Ratio  0.25 0.4   Macula Geographic atrophy, Hard drusen, Retinal pigment epithelial mottling, no hemorrhage Geographic atrophy, splits the FAZ, Hard drusen, Retinal pigment epithelial mottling, no hemorrhage   Vessels no DR no DR   Periphery Normal Normal          IMAGING AND PROCEDURES  Imaging and Procedures for 03/05/21  OCT, Retina - OU - Both Eyes       Right Eye Quality was good. Scan locations included subfoveal. Central Foveal Thickness: 234. Progression has been stable. Findings include abnormal foveal contour, no IRF, no SRF, outer retinal atrophy, central retinal atrophy.   Left Eye Quality was good. Scan locations included subfoveal. Central Foveal Thickness: 256. Progression has been stable. Findings include abnormal foveal contour, no SRF, no IRF, central retinal atrophy, outer retinal atrophy.   Notes No signs of CNVM OU                ASSESSMENT/PLAN:  Advanced nonexudative age-related macular degeneration of left eye without subfoveal involvement Dry ARMD with symptomatic progression yet no signs of CNVM  Advanced nonexudative age-related macular degeneration of right eye without subfoveal involvement Dry ARMD with no signs of CNVM  The nature  of age realated macular degeneration (ARMD)is explained as follows: The dry form refers to the progressive loss of normal blood supply to the central vision as a result of a combination of factors which include aging blood supply (arteriosclerosis, hardening of the arteries), genetics, smoking habits, and history of hypertension. Currently, no eye medications or vitamins slow this type of aging effect upon vision, however cessation of smoking and controlling hypertension help slow the disorder. The following analogy helps explain this: I describe the dry form of ARMD like a house of the same age as your eyes, which shows typical wear and tear of age upon the house structure and appearance. Like the aging house which can fall  down structurally, the dry form of ARMD can deteriorate the structure of the macula (center) of the retina, most often gradually and affect central vision tasks such as reading and driving. The wet form of ARMD refers to the development of abnormally growing blood vessels usually near or under the central vision, with potential risk of permanent visual changes or vision losses. This complication of the Dry form of ARMD may be moderately reduced by use of AREDS 2 formula multivitamins daily. I describe the Wet form of ARMD as like the development of a fire in an aging house (DRY ARMD). It may develop suddenly, progress rapidly and be destructive based on where it starts and how big the fire is when found. In the eye, the house fire analogy refers to the abnormal blood vessels growing destructively near the central vison in the retina, or film of the eye. Halting the growth of blood vessels with laser (hot or cold) or injectable medications is best way "to put the fire out". Many patients will experience a stabilization or even improvement in vision with a treatment course, while others may still face a loss of vision. The use of injectable medications has revolutionized therapy and is currently the only proven therapy to provide the chance of stable or improved acuity from new and recent destructive wet ARMD.      ICD-10-CM   1. Advanced nonexudative age-related macular degeneration of right eye without subfoveal involvement  H35.3113 OCT, Retina - OU - Both Eyes  2. Advanced nonexudative age-related macular degeneration of left eye without subfoveal involvement  H35.3123     1.  No signs of active CNVM  2.  Patient encouraged to use magnifying glasses with his current prescription for new reading as currently use  3.  Patient also encouraged to find electronic read of appropriate size so that he can learn to use magnification via enlargement of the print prior to losing his ability to see fine print what  ever decade that might occur  Ophthalmic Meds Ordered this visit:  No orders of the defined types were placed in this encounter.      Return in about 6 months (around 09/05/2021) for DILATE OU, OCT.  There are no Patient Instructions on file for this visit.   Explained the diagnoses, plan, and follow up with the patient and they expressed understanding.  Patient expressed understanding of the importance of proper follow up care.   Clent Demark Gregori Abril M.D. Diseases & Surgery of the Retina and Vitreous [facility] [place] [facility] 03/05/21     Abbreviations: M myopia (nearsighted); A astigmatism; H hyperopia (farsighted); P presbyopia; Mrx spectacle prescription;  CTL contact lenses; OD right eye; OS left eye; OU both eyes  XT exotropia; ET esotropia; PEK punctate epithelial keratitis; PEE punctate epithelial  erosions; DES dry eye syndrome; MGD meibomian gland dysfunction; ATs artificial tears; PFAT's preservative free artificial tears; [place] nuclear sclerotic cataract; PSC posterior subcapsular cataract; ERM epi-retinal membrane; PVD posterior vitreous detachment; RD retinal detachment; DM diabetes mellitus; DR diabetic retinopathy; NPDR non-proliferative diabetic retinopathy; PDR proliferative diabetic retinopathy; CSME clinically significant macular edema; DME diabetic macular edema; dbh dot blot hemorrhages; CWS cotton wool spot; POAG primary open angle glaucoma; C/D cup-to-disc ratio; HVF humphrey visual field; GVF goldmann visual field; OCT optical coherence tomography; IOP intraocular pressure; BRVO Branch retinal vein occlusion; CRVO central retinal vein occlusion; CRAO central retinal artery occlusion; BRAO branch retinal artery occlusion; RT retinal tear; SB scleral buckle; PPV pars plana vitrectomy; VH Vitreous hemorrhage; PRP panretinal laser photocoagulation; IVK intravitreal kenalog; VMT vitreomacular traction; MH Macular hole;  NVD neovascularization of the disc; NVE neovascularization  elsewhere; AREDS age related eye disease study; ARMD age related macular degeneration; POAG primary open angle glaucoma; EBMD epithelial/anterior basement membrane dystrophy; ACIOL anterior chamber intraocular lens; IOL intraocular lens; PCIOL posterior chamber intraocular lens; Phaco/IOL phacoemulsification with intraocular lens placement; [place] photorefractive keratectomy; LASIK laser assisted in situ keratomileusis; HTN hypertension; DM diabetes mellitus; COPD chronic obstructive pulmonary disease

## 2021-03-05 NOTE — Assessment & Plan Note (Signed)
Dry ARMD with symptomatic progression yet no signs of CNVM

## 2021-03-05 NOTE — Patient Instructions (Signed)
The nature of age realated macular degeneration (ARMD)is explained as follows: The dry form refers to the progressive loss of normal blood supply to the central vision as a result of a combination of factors which include aging blood supply (arteriosclerosis, hardening of the arteries), genetics, smoking habits, and history of hypertension. Currently, no eye medications or vitamins slow this type of aging effect upon vision, however cessation of smoking and controlling hypertension help slow the disorder. The following analogy helps explain this: I describe the dry form of ARMD like a house of the same age as your eyes, which shows typical wear and tear of age upon the house structure and appearance. Like the aging house which can fall down structurally, the dry form of ARMD can deteriorate the structure of the macula (center) of the retina, most often gradually and affect central vision tasks such as reading and driving. The wet form of ARMD refers to the development of abnormally growing blood vessels usually near or under the central vision, with potential risk of permanent visual changes or vision losses. This complication of the Dry form of ARMD may be moderately reduced by use of AREDS 2 formula multivitamins daily. I describe the Wet form of ARMD as like the development of a fire in an aging house (DRY ARMD). It may develop suddenly, progress rapidly and be destructive based on where it starts and how big the fire is when found. In the eye, the house fire analogy refers to the abnormal blood vessels growing destructively near the central vison in the retina, or film of the eye. Halting the growth of blood vessels with laser (hot or cold) or injectable medications is best way "to put the fire out". Many patients will experience a stabilization or even improvement in vision with a treatment course, while others may still face a loss of vision. The use of injectable medications has revolutionized therapy and is  currently the only proven therapy to provide the chance of stable or improved acuity from new and recent destructive wet ARMD. 

## 2021-03-06 ENCOUNTER — Ambulatory Visit (HOSPITAL_COMMUNITY): Payer: PPO

## 2021-03-06 ENCOUNTER — Encounter (HOSPITAL_COMMUNITY): Payer: Self-pay

## 2021-03-06 ENCOUNTER — Other Ambulatory Visit: Payer: Self-pay

## 2021-03-06 DIAGNOSIS — L97811 Non-pressure chronic ulcer of other part of right lower leg limited to breakdown of skin: Secondary | ICD-10-CM

## 2021-03-06 DIAGNOSIS — R2689 Other abnormalities of gait and mobility: Secondary | ICD-10-CM

## 2021-03-06 NOTE — Therapy (Signed)
[place], [place], [address] Phone: [phone]   Fax:  [phone]  Wound Care Therapy  Patient Details  Name: [name] MRN: [id number] Date of Birth: 1928-11-05 Referring Provider (PT): [name]   Encounter Date: 03/06/2021   PT End of Session - 03/06/21 1614    Visit Number 34    Number of Visits 84    Date for PT Re-Evaluation 04/08/21    Authorization Type [employer] (no auth req, no visit limit)    Progress Note Due on Visit 40    PT Start Time 1405    PT Stop Time 1450    PT Time Calculation (min) 45 min    Activity Tolerance Patient tolerated treatment well    Behavior During Therapy [facility] for tasks assessed/performed           Past Medical History:  Diagnosis Date  . Allergic rhinitis   . Asthmatic bronchitis   . Atrial fibrillation ([place])   . Celiac disease   . Cellulitis   . CKD (chronic kidney disease)   . Diabetes ([place])   . Diverticulosis   . Fx. left wrist 1987  . Gastric polyps   . GERD (gastroesophageal reflux disease)   . History of pneumonia   . Hypertension   . Hypothyroidism   . Iron deficiency anemia   . Melanoma ([place])   . Neuropathy   . PAF (paroxysmal atrial fibrillation) ([place])   . Prostate cancer ([place])   . REM sleep behavior disorder   . RLS (restless legs syndrome)   . Sleep apnea   . Spasticity   . Stroke ([place]) 09/2017  . Tremor   . Type 2 diabetes mellitus ([place])     Past Surgical History:  Procedure Laterality Date  . APPENDECTOMY  1978  . CARPAL TUNNEL RELEASE Left   . CATARACT EXTRACTION  03/2011   bilateral   . CHOLECYSTECTOMY  2001  . COLONOSCOPY W/ BIOPSIES  04/27/2008   diverticulosis, duodenitis  . FOOT SURGERY    . HERNIA REPAIR  1994   bilateral  . KNEE SURGERY Bilateral    x 2  . LUNG SURGERY  2001   for infection  . PROSTATECTOMY  2002  . UPPER GASTROINTESTINAL ENDOSCOPY  04/27/2008   celiac disease, gastric polyps     There were no vitals filed for this visit.    Subjective Assessment - 03/06/21 1608    Subjective Pt arrived wiht dressings intact, no reports of pain.    Currently in Pain? No/denies                     Wound Therapy - 03/06/21 0001    Subjective Pt arrived wiht dressings intact, no reports of pain.    Patient and Family Stated Goals wound to heal    Date of Onset 09/06/20    Prior Treatments MD and self care    Pain Scale 0-10    Pain Score 0-No pain    Evaluation and Treatment Procedures Explained to Patient/Family Yes    Evaluation and Treatment Procedures agreed to    Wound Properties Date First Assessed: 12/04/20 Time First Assessed: 8563 Wound Type: Venous stasis ulcer Location: Leg Location Orientation: Right Present on Admission: Yes   Wound Image View All Images View Images    Dressing Changed Changed    Dressing Status Old drainage    Dressing Change Frequency PRN    %  Wound base Red or Granulating 90%   following debridment   % Wound base Yellow/Fibrinous Exudate 10%    Wound Length (cm) 3.1 cm    Wound Width (cm) 2.5 cm    Wound Depth (cm) 0.5 cm   .4 at 7:00   Wound Volume (cm^3) 3.88 cm^3    Wound Surface Area (cm^2) 7.75 cm^2    Margins Attached edges (approximated)    Drainage Amount Minimal    Drainage Description Serosanguineous    Treatment Cleansed;Debridement (Selective)    Selective Debridement - Location entire wound bed    Selective Debridement - Tools Used Scalpel;Forceps    Selective Debridement - Tissue Removed slough    Wound Therapy - Clinical Statement Wound bed increased granulation tissue following debridment.  Returned to [facility] to address slough remaining in wound bed.  Skin perimeter anterior Rt shin continues to present with minimal rash, unchanged since last seen.  Skin dry, heavy dose of vaseline and lotion for skin integrity prior reapplication of dressings.    Wound Therapy - Functional Problem List gait and balance     Factors Delaying/Impairing Wound Healing Diabetes Mellitus;Vascular compromise;Altered sensation    Wound Therapy - Frequency 3X / week    Wound Plan Continue wound care if wound continues to be 100% granulated and begins to fill in see if family want to begin self care or continue skilled PT>    Dressing  medihoney, gauze, Soft lymph liner G, cotton, kerlix, coban,                     PT Short Term Goals - 02/25/21 1502      PT SHORT TERM GOAL #1   Title Patient will have at least 50% granualtion tissue.    Time 3    Period Weeks    Status Achieved    Target Date 12/25/20             PT Long Term Goals - 01/03/21 1344      PT LONG TERM GOAL #1   Title Patient's wound will be healed to reduce the risk of infection.    Time 6    Period Weeks    Status On-going      PT LONG TERM GOAL #2   Title Patient will be able to verbalize completion of daily skin checks to reduce the risk of further wound development.    Time 6    Period Weeks    Status On-going                  Patient will benefit from skilled therapeutic intervention in order to improve the following deficits and impairments:     Visit Diagnosis: Other abnormalities of gait and mobility  Ulcer of right pretibial region, limited to breakdown of skin [facility])     Problem List Patient Active Problem List   Diagnosis Date Noted  . Advanced nonexudative age-related macular degeneration of right eye without subfoveal involvement 11/15/2020  . Advanced nonexudative age-related macular degeneration of left eye without subfoveal involvement 11/15/2020  . Diabetes mellitus without complication ([place]) 35/46/5681  . Posterior vitreous detachment of both eyes 11/15/2020  . Gait disturbance, post-stroke 11/02/2017  . Aphasia as late effect of cerebrovascular accident 11/02/2017  . 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 Aphasia   . PAF (paroxysmal atrial fibrillation) ([place])   .  Diabetes mellitus type 2 in obese ([place])   . Benign essential HTN   .  Hypothyroidism   . Hyperlipidemia   . Stroke (cerebrum) ([place]) 10/02/2017  . Afib ([place]) 01/10/2017  . SOB (shortness of breath) 01/10/2017  . Acute diastolic CHF (congestive heart failure) ([place]) 01/10/2017  . Atrial fibrillation with RVR ([place]) 01/10/2017  . Claudication ([place]) 07/25/2016  . Paroxysmal atrial fibrillation ([place]) 07/25/2016  . Odynophagia 04/10/2014  . Allergic rhinitis 03/23/2014  . Chronic rhinitis 12/22/2013  . 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 Constipation 08/22/2011  . Obesity 09/20/2009  . G E R D 11/29/2007  . Celiac disease 11/29/2007   Ihor Austin, LPTA/CLT; CBIS [phone]  Aldona Lento 03/06/2021, 4:14 PM  [place], [place], [address] Phone: [phone]   Fax:  [phone]  Name: [name] MRN: [id number] Date of Birth: 1929-02-28

## 2021-03-07 ENCOUNTER — Ambulatory Visit (HOSPITAL_COMMUNITY): Payer: PPO | Admitting: Physical Therapy

## 2021-03-08 ENCOUNTER — Ambulatory Visit (HOSPITAL_COMMUNITY): Payer: PPO | Admitting: Physical Therapy

## 2021-03-08 ENCOUNTER — Other Ambulatory Visit: Payer: Self-pay

## 2021-03-08 DIAGNOSIS — L97811 Non-pressure chronic ulcer of other part of right lower leg limited to breakdown of skin: Secondary | ICD-10-CM

## 2021-03-08 DIAGNOSIS — R2689 Other abnormalities of gait and mobility: Secondary | ICD-10-CM

## 2021-03-08 NOTE — Therapy (Signed)
[place], [place], [address] Phone: [phone]   Fax:  [phone]  Wound Care Therapy  Patient Details  Name: [name] MRN: [id number] Date of Birth: [birth year] Referring Provider (PT): [name]   Encounter Date: 03/08/2021   PT End of Session - 03/08/21 1530    Visit Number 35    Number of Visits 31    Date for PT Re-Evaluation 04/08/21    Authorization Type [employer] (no auth req, no visit limit)    Progress Note Due on Visit 40    PT Start Time 1455    PT Stop Time 1523    PT Time Calculation (min) 28 min    Activity Tolerance Patient tolerated treatment well    Behavior During Therapy [facility] for tasks assessed/performed           Past Medical History:  Diagnosis Date  . Allergic rhinitis   . Asthmatic bronchitis   . Atrial fibrillation ([place])   . Celiac disease   . Cellulitis   . CKD (chronic kidney disease)   . Diabetes ([place])   . Diverticulosis   . Fx. left wrist 1987  . Gastric polyps   . GERD (gastroesophageal reflux disease)   . History of pneumonia   . Hypertension   . Hypothyroidism   . Iron deficiency anemia   . Melanoma ([place])   . Neuropathy   . PAF (paroxysmal atrial fibrillation) ([place])   . Prostate cancer ([place])   . REM sleep behavior disorder   . RLS (restless legs syndrome)   . Sleep apnea   . Spasticity   . Stroke ([place]) 09/2017  . Tremor   . Type 2 diabetes mellitus ([place])     Past Surgical History:  Procedure Laterality Date  . APPENDECTOMY  1978  . CARPAL TUNNEL RELEASE Left   . CATARACT EXTRACTION  03/2011   bilateral   . CHOLECYSTECTOMY  2001  . COLONOSCOPY W/ BIOPSIES  04/27/2008   diverticulosis, duodenitis  . FOOT SURGERY    . HERNIA REPAIR  1994   bilateral  . KNEE SURGERY Bilateral    x 2  . LUNG SURGERY  2001   for infection  . PROSTATECTOMY  2002  . UPPER GASTROINTESTINAL ENDOSCOPY  04/27/2008   celiac disease, gastric polyps     There were no vitals filed for this visit.               Wound Therapy - 03/08/21 0001    Subjective Pt states that overall his leg feels better, less pain, when his legs are down but his MD says to raise them therefore he tends to keep them up.  States he has been having greater pain in his leg .    Patient and Family Stated Goals wound to heal    Date of Onset 09/06/20    Prior Treatments MD and self care    Pain Scale 0-10    Pain Score 3     Pain Type Acute pain    Pain Location Leg    Pain Orientation Right;Lateral;Posterior    Evaluation and Treatment Procedures Explained to Patient/Family Yes    Evaluation and Treatment Procedures agreed to    Wound Properties Date First Assessed: 12/04/20 Time First Assessed: 9030 Wound Type: Venous stasis ulcer Location: Leg Location Orientation: Right Present on Admission: Yes   Dressing Changed Changed    Dressing Status Old drainage    Dressing  Change Frequency PRN    Site / Wound Assessment Granulation tissue;Yellow    % Wound base Red or Granulating 90%    % Wound base Yellow/Fibrinous Exudate 10%    Wound Length (cm) 3 cm    Wound Width (cm) 2.6 cm    Wound Depth (cm) 0.3 cm    Wound Volume (cm^3) 2.34 cm^3    Wound Surface Area (cm^2) 7.8 cm^2    Margins Attached edges (approximated)    Drainage Amount Minimal    Drainage Description Serous    Treatment Cleansed;Debridement (Selective)    Selective Debridement - Location entire wound bed    Selective Debridement - Tools Used Scalpel;Forceps    Selective Debridement - Tissue Removed sloug, biofilm    Wound Therapy - Clinical Statement Therapist returned to santyl as wound is not 100% granulated and santyl was chemically debriding wound.  Due to pt complaint of increased pain and noted improvement with dependent postitioning therapist held compression.    Wound Therapy - Functional Problem List gait and balance    Factors Delaying/Impairing Wound Healing Diabetes  Mellitus;Vascular compromise;Altered sensation    Wound Therapy - Frequency 3X / week    Wound Plan Assess how swollen pt LE is withoutcompression, ask pt if pain level was better over the weekend.  If granulation is greater than 90% continue with santyl    Dressing  Santyl, gauze, hydorgel 2x2, kerlix, netting                     PT Short Term Goals - 02/25/21 1502      PT SHORT TERM GOAL #1   Title Patient will have at least 50% granualtion tissue.    Time 3    Period Weeks    Status Achieved    Target Date 12/25/20             PT Long Term Goals - 01/03/21 1344      PT LONG TERM GOAL #1   Title Patient's wound will be healed to reduce the risk of infection.    Time 6    Period Weeks    Status On-going      PT LONG TERM GOAL #2   Title Patient will be able to verbalize completion of daily skin checks to reduce the risk of further wound development.    Time 6    Period Weeks    Status On-going                 Plan - 03/08/21 1530    Clinical Impression Statement see above    Personal Factors and Comorbidities Age;Comorbidity 3+;Past/Current Experience;Fitness;Time since onset of injury/illness/exacerbation    Comorbidities DM, CKD, neuorpathy, Hx CVA    Examination-Activity Limitations Locomotion Level;Transfers;Squat;Stand;Dressing    Examination-Participation Restrictions Community Activity    Stability/Clinical Decision Making Stable/Uncomplicated    Rehab Potential Good    PT Frequency 3x / week    PT Duration 6 weeks   increased time secondary to slow healing wound   PT Treatment/Interventions Gait training;Stair training;Functional mobility training;DME Instruction;Therapeutic activities;Therapeutic exercise;Balance training;Neuromuscular re-education;Patient/family education;Compression bandaging;Manual lymph drainage;Manual techniques    PT Next Visit Plan Continue with both chemical and mechanical debridement to remove adherent eschar to  allow healing to occure    Consulted and Agree with Plan of Care Patient;Family member/caregiver    Family Member Consulted wife           Patient will benefit from skilled therapeutic intervention in  order to improve the following deficits and impairments:  Abnormal gait,Decreased skin integrity,Decreased mobility,Impaired sensation  Visit Diagnosis: Other abnormalities of gait and mobility  Ulcer of right pretibial region, limited to breakdown of skin [facility])     Problem List Patient Active Problem List   Diagnosis Date Noted  . Advanced nonexudative age-related macular degeneration of right eye without subfoveal involvement 11/15/2020  . Advanced nonexudative age-related macular degeneration of left eye without subfoveal involvement 11/15/2020  . Diabetes mellitus without complication ([place]) 74/14/2395  . Posterior vitreous detachment of both eyes 11/15/2020  . Gait disturbance, post-stroke 11/02/2017  . Aphasia as late effect of cerebrovascular accident 11/02/2017  . 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 Aphasia   . PAF (paroxysmal atrial fibrillation) ([place])   . Diabetes mellitus type 2 in obese ([place])   . Benign essential HTN   . Hypothyroidism   . Hyperlipidemia   . Stroke (cerebrum) ([place]) 10/02/2017  . Afib ([place]) 01/10/2017  . SOB (shortness of breath) 01/10/2017  . Acute diastolic CHF (congestive heart failure) ([place]) 01/10/2017  . Atrial fibrillation with RVR ([place]) 01/10/2017  . Claudication ([place]) 07/25/2016  . Paroxysmal atrial fibrillation ([place]) 07/25/2016  . Odynophagia 04/10/2014  . Allergic rhinitis 03/23/2014  . Chronic rhinitis 12/22/2013  . 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 Constipation 08/22/2011  . Obesity 09/20/2009  . G E R D 11/29/2007  . Celiac disease 11/29/2007   Rayetta Humphrey, PT CLT [phone] 03/08/2021, 3:31 PM  [place] [address] [place], [place], [address] Phone:  [phone]   Fax:  [phone]  Name: BENJERMIN KORBER MRN: [id number] Date of Birth: 1928-10-21

## 2021-03-12 ENCOUNTER — Ambulatory Visit (HOSPITAL_COMMUNITY): Payer: PPO | Admitting: Physical Therapy

## 2021-03-12 DIAGNOSIS — I10 Essential (primary) hypertension: Secondary | ICD-10-CM | POA: Diagnosis not present

## 2021-03-12 DIAGNOSIS — E7800 Pure hypercholesterolemia, unspecified: Secondary | ICD-10-CM | POA: Diagnosis not present

## 2021-03-13 ENCOUNTER — Ambulatory Visit (HOSPITAL_COMMUNITY): Payer: PPO | Attending: Internal Medicine

## 2021-03-13 ENCOUNTER — Other Ambulatory Visit: Payer: Self-pay

## 2021-03-13 ENCOUNTER — Encounter (HOSPITAL_COMMUNITY): Payer: Self-pay

## 2021-03-13 DIAGNOSIS — R2689 Other abnormalities of gait and mobility: Secondary | ICD-10-CM | POA: Diagnosis not present

## 2021-03-13 DIAGNOSIS — L97811 Non-pressure chronic ulcer of other part of right lower leg limited to breakdown of skin: Secondary | ICD-10-CM | POA: Insufficient documentation

## 2021-03-13 NOTE — Therapy (Signed)
[place], [place], [address] Phone: [phone]   Fax:  [phone]  Wound Care Therapy  Patient Details  Name: [name] MRN: [id number] Date of Birth: [birth year] Referring Provider (PT): [name]   Encounter Date: 03/13/2021   PT End of Session - 03/13/21 1713    Visit Number 36    Number of Visits 43    Date for PT Re-Evaluation 04/08/21    Authorization Type [employer] (no auth req, no visit limit)    Progress Note Due on Visit 40    PT Start Time 1617    PT Stop Time 1655    PT Time Calculation (min) 38 min    Activity Tolerance Patient tolerated treatment well    Behavior During Therapy [facility] for tasks assessed/performed           Past Medical History:  Diagnosis Date  . Allergic rhinitis   . Asthmatic bronchitis   . Atrial fibrillation ([place])   . Celiac disease   . Cellulitis   . CKD (chronic kidney disease)   . Diabetes ([place])   . Diverticulosis   . Fx. left wrist 1987  . Gastric polyps   . GERD (gastroesophageal reflux disease)   . History of pneumonia   . Hypertension   . Hypothyroidism   . Iron deficiency anemia   . Melanoma ([place])   . Neuropathy   . PAF (paroxysmal atrial fibrillation) ([place])   . Prostate cancer ([place])   . REM sleep behavior disorder   . RLS (restless legs syndrome)   . Sleep apnea   . Spasticity   . Stroke ([place]) 09/2017  . Tremor   . Type 2 diabetes mellitus ([place])     Past Surgical History:  Procedure Laterality Date  . APPENDECTOMY  1978  . CARPAL TUNNEL RELEASE Left   . CATARACT EXTRACTION  03/2011   bilateral   . CHOLECYSTECTOMY  2001  . COLONOSCOPY W/ BIOPSIES  04/27/2008   diverticulosis, duodenitis  . FOOT SURGERY    . HERNIA REPAIR  1994   bilateral  . KNEE SURGERY Bilateral    x 2  . LUNG SURGERY  2001   for infection  . PROSTATECTOMY  2002  . UPPER GASTROINTESTINAL ENDOSCOPY  04/27/2008   celiac disease, gastric polyps     There were no vitals filed for this visit.    Subjective Assessment - 03/13/21 1706    Subjective Wife stated increased drainage through dressings, pt reports increased pain at entrance.    Currently in Pain? Yes    Pain Score 5     Pain Location Leg    Pain Orientation Right    Pain Descriptors / Indicators Burning    Pain Type Acute pain    Pain Onset More than a month ago    Pain Frequency Intermittent                     Wound Therapy - 03/13/21 1706    Subjective Wife stated increased drainage through dressings, pt reports increased pain at entrance.    Patient and Family Stated Goals wound to heal    Date of Onset 09/06/20    Prior Treatments MD and self care    Pain Scale 0-10    Evaluation and Treatment Procedures Explained to Patient/Family Yes    Evaluation and Treatment Procedures agreed to    Wound Properties Date First Assessed: 12/04/20 Time First  Assessed: 1545 Wound Type: Venous stasis ulcer Location: Leg Location Orientation: Right Present on Admission: Yes   Wound Image View All Images View Images    Dressing Changed Changed    Dressing Status Old drainage    Dressing Change Frequency PRN    Site / Wound Assessment Granulation tissue    % Wound base Red or Granulating 95%    % Wound base Yellow/Fibrinous Exudate 5%    Wound Length (cm) 3 cm    Wound Width (cm) 2.6 cm    Wound Depth (cm) 0.3 cm    Wound Volume (cm^3) 2.34 cm^3    Wound Surface Area (cm^2) 7.8 cm^2    Margins Attached edges (approximated)    Drainage Amount Minimal    Drainage Description Serous    Treatment Cleansed;Debridement (Selective)    Selective Debridement - Location entire wound bed    Selective Debridement - Tools Used Scalpel;Forceps    Selective Debridement - Tissue Removed sloug, biofilm    Wound Therapy - Clinical Statement Pt arrived with increased pain and edema present Rt LE.  Slough less adherent and easy to remove from wound bed this session.  Continued  with Santyl and resumed coban for edema control with reports of comfort at EOS.    Wound Therapy - Functional Problem List gait and balance    Factors Delaying/Impairing Wound Healing Diabetes Mellitus;Vascular compromise;Altered sensation    Wound Therapy - Frequency 3X / week    Wound Plan Continue with coban for edema control.  Assess granulation and use appropriate dressings.    Dressing  Santyl, gauze, 2x2, kerlix, coban, netting #5                     PT Short Term Goals - 02/25/21 1502      PT SHORT TERM GOAL #1   Title Patient will have at least 50% granualtion tissue.    Time 3    Period Weeks    Status Achieved    Target Date 12/25/20             PT Long Term Goals - 01/03/21 1344      PT LONG TERM GOAL #1   Title Patient's wound will be healed to reduce the risk of infection.    Time 6    Period Weeks    Status On-going      PT LONG TERM GOAL #2   Title Patient will be able to verbalize completion of daily skin checks to reduce the risk of further wound development.    Time 6    Period Weeks    Status On-going                  Patient will benefit from skilled therapeutic intervention in order to improve the following deficits and impairments:     Visit Diagnosis: Other abnormalities of gait and mobility  Ulcer of right pretibial region, limited to breakdown of skin [facility])     Problem List Patient Active Problem List   Diagnosis Date Noted  . Advanced nonexudative age-related macular degeneration of right eye without subfoveal involvement 11/15/2020  . Advanced nonexudative age-related macular degeneration of left eye without subfoveal involvement 11/15/2020  . Diabetes mellitus without complication ([place]) 74/25/9563  . Posterior vitreous detachment of both eyes 11/15/2020  . Gait disturbance, post-stroke 11/02/2017  . Aphasia as late effect of cerebrovascular accident 11/02/2017  . Left middle cerebral artery stroke ([place])  10/07/2017  . Aphasia   .  PAF (paroxysmal atrial fibrillation) ([place])   . Diabetes mellitus type 2 in obese ([place])   . Benign essential HTN   . Hypothyroidism   . Hyperlipidemia   . Stroke (cerebrum) ([place]) 10/02/2017  . Afib ([place]) 01/10/2017  . SOB (shortness of breath) 01/10/2017  . Acute diastolic CHF (congestive heart failure) ([place]) 01/10/2017  . Atrial fibrillation with RVR ([place]) 01/10/2017  . Claudication ([place]) 07/25/2016  . Paroxysmal atrial fibrillation ([place]) 07/25/2016  . Odynophagia 04/10/2014  . Allergic rhinitis 03/23/2014  . Chronic rhinitis 12/22/2013  . 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 Constipation 08/22/2011  . Obesity 09/20/2009  . G E R D 11/29/2007  . Celiac disease 11/29/2007   Ihor Austin, LPTA/CLT; CBIS [phone]  Aldona Lento 03/13/2021, 5:15 PM  [place], [place], [address] Phone: [phone]   Fax:  [phone]  Name: [name] MRN: [id number] Date of Birth: 05/24/1929

## 2021-03-14 ENCOUNTER — Ambulatory Visit (HOSPITAL_COMMUNITY): Payer: PPO | Admitting: Physical Therapy

## 2021-03-15 ENCOUNTER — Ambulatory Visit (HOSPITAL_COMMUNITY): Payer: PPO

## 2021-03-15 ENCOUNTER — Encounter (HOSPITAL_COMMUNITY): Payer: Self-pay

## 2021-03-15 DIAGNOSIS — L97811 Non-pressure chronic ulcer of other part of right lower leg limited to breakdown of skin: Secondary | ICD-10-CM

## 2021-03-15 DIAGNOSIS — R2689 Other abnormalities of gait and mobility: Secondary | ICD-10-CM

## 2021-03-15 NOTE — Therapy (Signed)
[place], [place], [address] Phone: [phone]   Fax:  [phone]  Wound Care Therapy  Patient Details  Name: [name] MRN: [id number] Date of Birth: [birth year] Referring Provider (PT): [name]   Encounter Date: 03/15/2021   PT End of Session - 03/15/21 1229    Visit Number 37    Number of Visits 28    Date for PT Re-Evaluation 04/08/21    Authorization Type [employer] (no auth req, no visit limit)    Progress Note Due on Visit 40    PT Start Time 1138    PT Stop Time 1210    PT Time Calculation (min) 32 min    Activity Tolerance Patient tolerated treatment well    Behavior During Therapy [facility] for tasks assessed/performed           Past Medical History:  Diagnosis Date  . Allergic rhinitis   . Asthmatic bronchitis   . Atrial fibrillation ([place])   . Celiac disease   . Cellulitis   . CKD (chronic kidney disease)   . Diabetes ([place])   . Diverticulosis   . Fx. left wrist 1987  . Gastric polyps   . GERD (gastroesophageal reflux disease)   . History of pneumonia   . Hypertension   . Hypothyroidism   . Iron deficiency anemia   . Melanoma ([place])   . Neuropathy   . PAF (paroxysmal atrial fibrillation) ([place])   . Prostate cancer ([place])   . REM sleep behavior disorder   . RLS (restless legs syndrome)   . Sleep apnea   . Spasticity   . Stroke ([place]) 09/2017  . Tremor   . Type 2 diabetes mellitus ([place])     Past Surgical History:  Procedure Laterality Date  . APPENDECTOMY  1978  . CARPAL TUNNEL RELEASE Left   . CATARACT EXTRACTION  03/2011   bilateral   . CHOLECYSTECTOMY  2001  . COLONOSCOPY W/ BIOPSIES  04/27/2008   diverticulosis, duodenitis  . FOOT SURGERY    . HERNIA REPAIR  1994   bilateral  . KNEE SURGERY Bilateral    x 2  . LUNG SURGERY  2001   for infection  . PROSTATECTOMY  2002  . UPPER GASTROINTESTINAL ENDOSCOPY  04/27/2008   celiac disease, gastric polyps     There were no vitals filed for this visit.    Subjective Assessment - 03/15/21 1226    Subjective Reports pain low today, maybe 2/10.  Improved since last session.    Currently in Pain? Yes    Pain Score 2     Pain Location Leg    Pain Orientation Right    Pain Descriptors / Indicators Burning    Pain Type Acute pain    Pain Onset More than a month ago    Pain Frequency Intermittent                     Wound Therapy - 03/15/21 1226    Subjective Reports pain low today, maybe 2/10.  Improved since last session.    Patient and Family Stated Goals wound to heal    Date of Onset 09/06/20    Prior Treatments MD and self care    Pain Scale 0-10    Evaluation and Treatment Procedures Explained to Patient/Family Yes    Evaluation and Treatment Procedures agreed to    Wound Properties Date First Assessed: 12/04/20 Time First Assessed: 7673  Wound Type: Venous stasis ulcer Location: Leg Location Orientation: Right Present on Admission: Yes   Dressing Changed Changed    Dressing Status Old drainage    Dressing Change Frequency PRN    Site / Wound Assessment Granulation tissue    % Wound base Red or Granulating 95%    % Wound base Yellow/Fibrinous Exudate 5%    Wound Length (cm) 3 cm    Wound Width (cm) 2.6 cm    Wound Depth (cm) 0.3 cm    Wound Volume (cm^3) 2.34 cm^3    Wound Surface Area (cm^2) 7.8 cm^2    Margins Attached edges (approximated)    Drainage Amount Minimal    Drainage Description Serous    Treatment Cleansed;Debridement (Selective)    Selective Debridement - Location entire wound bed    Selective Debridement - Tools Used Scalpel;Forceps    Selective Debridement - Tissue Removed sloug, biofilm    Wound Therapy - Clinical Statement Improved approximation all borders notes, continues to have adherent slough that is slightly easier to debridement with Santyl dressings.  Improved granulation following debridement.  Santyl low, requested pt to get more from  pharmacy.    Wound Therapy - Functional Problem List gait and balance    Factors Delaying/Impairing Wound Healing Diabetes Mellitus;Vascular compromise;Altered sensation    Wound Therapy - Frequency 3X / week    Wound Plan Continue with coban for edema control.  Assess granulation and use appropriate dressings.    Dressing  Santyl, gauze, 2x2, kerlix, coban, netting #5                     PT Short Term Goals - 02/25/21 1502      PT SHORT TERM GOAL #1   Title Patient will have at least 50% granualtion tissue.    Time 3    Period Weeks    Status Achieved    Target Date 12/25/20             PT Long Term Goals - 01/03/21 1344      PT LONG TERM GOAL #1   Title Patient's wound will be healed to reduce the risk of infection.    Time 6    Period Weeks    Status On-going      PT LONG TERM GOAL #2   Title Patient will be able to verbalize completion of daily skin checks to reduce the risk of further wound development.    Time 6    Period Weeks    Status On-going                  Patient will benefit from skilled therapeutic intervention in order to improve the following deficits and impairments:     Visit Diagnosis: Ulcer of right pretibial region, limited to breakdown of skin (HCC)  Other abnormalities of gait and mobility     Problem List Patient Active Problem List   Diagnosis Date Noted  . Advanced nonexudative age-related macular degeneration of right eye without subfoveal involvement 11/15/2020  . Advanced nonexudative age-related macular degeneration of left eye without subfoveal involvement 11/15/2020  . Diabetes mellitus without complication ([place]) 28/76/8115  . Posterior vitreous detachment of both eyes 11/15/2020  . Gait disturbance, post-stroke 11/02/2017  . Aphasia as late effect of cerebrovascular accident 11/02/2017  . 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 Aphasia   . PAF (paroxysmal atrial fibrillation) ([place])   .  Diabetes mellitus type 2 in obese ([place])   .  Benign essential HTN   . Hypothyroidism   . Hyperlipidemia   . Stroke (cerebrum) ([place]) 10/02/2017  . Afib ([place]) 01/10/2017  . SOB (shortness of breath) 01/10/2017  . Acute diastolic CHF (congestive heart failure) ([place]) 01/10/2017  . Atrial fibrillation with RVR ([place]) 01/10/2017  . Claudication ([place]) 07/25/2016  . Paroxysmal atrial fibrillation ([place]) 07/25/2016  . Odynophagia 04/10/2014  . Allergic rhinitis 03/23/2014  . Chronic rhinitis 12/22/2013  . 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 Constipation 08/22/2011  . Obesity 09/20/2009  . G E R D 11/29/2007  . Celiac disease 11/29/2007   Ihor Austin, LPTA/CLT; CBIS [phone]  Aldona Lento 03/15/2021, 12:30 PM  [place], [place], [address] Phone: [phone]   Fax:  [phone]  Name: GRAYDEN BURLEY MRN: [id number] Date of Birth: [birth year]

## 2021-03-18 ENCOUNTER — Ambulatory Visit (HOSPITAL_COMMUNITY): Payer: PPO | Admitting: Physical Therapy

## 2021-03-18 ENCOUNTER — Other Ambulatory Visit: Payer: Self-pay

## 2021-03-18 DIAGNOSIS — R2689 Other abnormalities of gait and mobility: Secondary | ICD-10-CM | POA: Diagnosis not present

## 2021-03-18 DIAGNOSIS — L97811 Non-pressure chronic ulcer of other part of right lower leg limited to breakdown of skin: Secondary | ICD-10-CM

## 2021-03-18 NOTE — Therapy (Addendum)
[place], [place], [address] Phone: [phone]   Fax:  [phone]  Wound Care Therapy  Patient Details  Name: [name] MRN: [id number] Date of Birth: Feb 25, 1929 Referring Provider (PT): [name]   Encounter Date: 03/18/2021    PT End of Session - 03/15/21 1229         Visit Number 38    Number of Visits 48     Date for PT Re-Evaluation 04/08/21     Authorization Type [employer] (no auth req, no visit limit)     Progress Note Due on Visit 40     PT Start Time 1405     PT Stop Time 1440     PT Time Calculation (min) 35 min     Activity Tolerance Patient tolerated treatment well     Behavior During Therapy [facility] for tasks assessed/performed       Past Medical History:  Diagnosis Date  . Allergic rhinitis   . Asthmatic bronchitis   . Atrial fibrillation ([place])   . Celiac disease   . Cellulitis   . CKD (chronic kidney disease)   . Diabetes ([place])   . Diverticulosis   . Fx. left wrist 1987  . Gastric polyps   . GERD (gastroesophageal reflux disease)   . History of pneumonia   . Hypertension   . Hypothyroidism   . Iron deficiency anemia   . Melanoma ([place])   . Neuropathy   . PAF (paroxysmal atrial fibrillation) ([place])   . Prostate cancer ([place])   . REM sleep behavior disorder   . RLS (restless legs syndrome)   . Sleep apnea   . Spasticity   . Stroke ([place]) 09/2017  . Tremor   . Type 2 diabetes mellitus ([place])     Past Surgical History:  Procedure Laterality Date  . APPENDECTOMY  1978  . CARPAL TUNNEL RELEASE Left   . CATARACT EXTRACTION  03/2011   bilateral   . CHOLECYSTECTOMY  2001  . COLONOSCOPY W/ BIOPSIES  04/27/2008   diverticulosis, duodenitis  . FOOT SURGERY    . HERNIA REPAIR  1994   bilateral  . KNEE SURGERY Bilateral    x 2  . LUNG SURGERY  2001   for infection  . PROSTATECTOMY  2002  . UPPER GASTROINTESTINAL ENDOSCOPY  04/27/2008   celiac disease,  gastric polyps    There were no vitals filed for this visit.               Wound Therapy - 03/18/21 1449    Subjective pt comes today with more santyl.  no pain or issues.    Patient and Family Stated Goals wound to heal    Date of Onset 09/06/20    Prior Treatments MD and self care    Pain Scale 0-10    Pain Score 0-No pain    Evaluation and Treatment Procedures Explained to Patient/Family Yes    Evaluation and Treatment Procedures agreed to    Wound Properties Date First Assessed: 12/04/20 Time First Assessed: 1093 Wound Type: Venous stasis ulcer Location: Leg Location Orientation: Right Present on Admission: Yes   Dressing Changed Changed    Dressing Status Old drainage    Dressing Change Frequency PRN    Site / Wound Assessment Granulation tissue    % Wound base Red or Granulating 95%    % Wound base Yellow/Fibrinous Exudate 5%    Margins Attached edges (approximated)  Drainage Amount Minimal    Drainage Description Serous    Treatment Cleansed;Debridement (Selective)    Selective Debridement - Location entire wound bed    Selective Debridement - Tools Used Scalpel;Forceps    Selective Debridement - Tissue Removed sloug, biofilm    Wound Therapy - Clinical Statement Bandage had slid down with some redness above, bottlenecking.   Also noted redness/dryness inferior to wound and marked this area with a marker to watch if spreads.  Cleansed well and moisturized.  Did not use lymph netting this time, used cotton instead to see if this was irritating skin.  Able to debride most slough from woundbed and continued with santyl.    Wound Therapy - Functional Problem List gait and balance    Factors Delaying/Impairing Wound Healing Diabetes Mellitus;Vascular compromise;Altered sensation    Wound Therapy - Frequency 3X / week    Wound Plan Continue with coban for edema control.  Assess granulation and use appropriate dressings.  measure and photograph at end of week each week.   May want to add back foam if continues to slide down    Dressing  Santyl, gauze, 2x2,cotton,  kerlix, coban, netting #5                     PT Short Term Goals - 02/25/21 1502      PT SHORT TERM GOAL #1   Title Patient will have at least 50% granualtion tissue.    Time 3    Period Weeks    Status Achieved    Target Date 12/25/20             PT Long Term Goals - 01/03/21 1344      PT LONG TERM GOAL #1   Title Patient's wound will be healed to reduce the risk of infection.    Time 6    Period Weeks    Status On-going      PT LONG TERM GOAL #2   Title Patient will be able to verbalize completion of daily skin checks to reduce the risk of further wound development.    Time 6    Period Weeks    Status On-going                  Patient will benefit from skilled therapeutic intervention in order to improve the following deficits and impairments:     Visit Diagnosis: Ulcer of right pretibial region, limited to breakdown of skin (HCC)  Other abnormalities of gait and mobility     Problem List Patient Active Problem List   Diagnosis Date Noted  . Advanced nonexudative age-related macular degeneration of right eye without subfoveal involvement 11/15/2020  . Advanced nonexudative age-related macular degeneration of left eye without subfoveal involvement 11/15/2020  . Diabetes mellitus without complication ([place]) 32/67/1245  . Posterior vitreous detachment of both eyes 11/15/2020  . Gait disturbance, post-stroke 11/02/2017  . Aphasia as late effect of cerebrovascular accident 11/02/2017  . 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 Aphasia   . PAF (paroxysmal atrial fibrillation) ([place])   . Diabetes mellitus type 2 in obese ([place])   . Benign essential HTN   . Hypothyroidism   . Hyperlipidemia   . Stroke (cerebrum) ([place]) 10/02/2017  . Afib ([place]) 01/10/2017  . SOB (shortness of breath) 01/10/2017  . Acute diastolic CHF (congestive heart  failure) ([place]) 01/10/2017  . Atrial fibrillation with RVR ([place]) 01/10/2017  . Claudication ([place]) 07/25/2016  . Paroxysmal atrial  fibrillation ([place]) 07/25/2016  . Odynophagia 04/10/2014  . Allergic rhinitis 03/23/2014  . Chronic rhinitis 12/22/2013  . 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 Constipation 08/22/2011  . Obesity 09/20/2009  . G E R D 11/29/2007  . Celiac disease 11/29/2007   Teena Irani, PTA/CLT [phone]  Teena Irani 03/18/2021, 2:54 PM  [facility] [address] [place], [place], [address] Phone: [phone]   Fax:  [phone]  Name: [name] MRN: [id number] Date of Birth: [birth year]

## 2021-03-20 ENCOUNTER — Ambulatory Visit (HOSPITAL_COMMUNITY): Payer: PPO | Admitting: Physical Therapy

## 2021-03-20 DIAGNOSIS — Z7984 Long term (current) use of oral hypoglycemic drugs: Secondary | ICD-10-CM | POA: Diagnosis not present

## 2021-03-20 DIAGNOSIS — Z961 Presence of intraocular lens: Secondary | ICD-10-CM | POA: Diagnosis not present

## 2021-03-20 DIAGNOSIS — H524 Presbyopia: Secondary | ICD-10-CM | POA: Diagnosis not present

## 2021-03-20 DIAGNOSIS — E119 Type 2 diabetes mellitus without complications: Secondary | ICD-10-CM | POA: Diagnosis not present

## 2021-03-20 DIAGNOSIS — H5202 Hypermetropia, left eye: Secondary | ICD-10-CM | POA: Diagnosis not present

## 2021-03-20 DIAGNOSIS — H353132 Nonexudative age-related macular degeneration, bilateral, intermediate dry stage: Secondary | ICD-10-CM | POA: Diagnosis not present

## 2021-03-20 DIAGNOSIS — H52203 Unspecified astigmatism, bilateral: Secondary | ICD-10-CM | POA: Diagnosis not present

## 2021-03-21 ENCOUNTER — Encounter (INDEPENDENT_AMBULATORY_CARE_PROVIDER_SITE_OTHER): Payer: PPO | Admitting: Ophthalmology

## 2021-03-21 ENCOUNTER — Other Ambulatory Visit: Payer: Self-pay

## 2021-03-21 ENCOUNTER — Encounter (HOSPITAL_COMMUNITY): Payer: Self-pay | Admitting: Physical Therapy

## 2021-03-21 ENCOUNTER — Ambulatory Visit (HOSPITAL_COMMUNITY): Payer: PPO | Admitting: Physical Therapy

## 2021-03-21 DIAGNOSIS — L03119 Cellulitis of unspecified part of limb: Secondary | ICD-10-CM | POA: Diagnosis not present

## 2021-03-21 DIAGNOSIS — E11622 Type 2 diabetes mellitus with other skin ulcer: Secondary | ICD-10-CM | POA: Diagnosis not present

## 2021-03-21 DIAGNOSIS — L97811 Non-pressure chronic ulcer of other part of right lower leg limited to breakdown of skin: Secondary | ICD-10-CM

## 2021-03-21 DIAGNOSIS — R2689 Other abnormalities of gait and mobility: Secondary | ICD-10-CM | POA: Diagnosis not present

## 2021-03-21 NOTE — Therapy (Signed)
[facility] [address] [place], [place], [address] Phone: [phone]   Fax:  [phone]  Wound Care Therapy/Progress Note  Patient Details  Name: [name] MRN: [id number] Date of Birth: [birth year] Referring Provider (PT): [name]   Encounter Date: 03/21/2021  Progress Note   Reporting Period 02/25/21 to 03/21/21   See note below for Objective Data and Assessment of Progress/Goals    PT End of Session - 03/21/21 1446     Visit Number 39    Number of Visits 48    Date for PT Re-Evaluation 04/08/21    Authorization Type [employer] (no auth req, no visit limit)    Progress Note Due on Visit 51    PT Start Time 1402    PT Stop Time 1435    PT Time Calculation (min) 33 min    Activity Tolerance Patient tolerated treatment well    Behavior During Therapy [facility] for tasks assessed/performed             Past Medical History:  Diagnosis Date   Allergic rhinitis    Asthmatic bronchitis    Atrial fibrillation (HCC)    Celiac disease    Cellulitis    CKD (chronic kidney disease)    Diabetes ([place])    Diverticulosis    Fx. left wrist 1987   Gastric polyps    GERD (gastroesophageal reflux disease)    History of pneumonia    Hypertension    Hypothyroidism    Iron deficiency anemia    Melanoma ([place])    Neuropathy    PAF (paroxysmal atrial fibrillation) ([place])    Prostate cancer ([place])    REM sleep behavior disorder    RLS (restless legs syndrome)    Sleep apnea    Spasticity    Stroke ([place]) 09/2017   Tremor    Type 2 diabetes mellitus ([place])     Past Surgical History:  Procedure Laterality Date   APPENDECTOMY  1978   CARPAL TUNNEL RELEASE Left    CATARACT EXTRACTION  03/2011   bilateral    CHOLECYSTECTOMY  2001   COLONOSCOPY W/ BIOPSIES  04/27/2008   diverticulosis, duodenitis   FOOT SURGERY     HERNIA REPAIR  1994   bilateral   KNEE SURGERY Bilateral    x 2   LUNG SURGERY  2001   for infection    PROSTATECTOMY  2002   UPPER GASTROINTESTINAL ENDOSCOPY  04/27/2008   celiac disease, gastric polyps    There were no vitals filed for this visit.               Wound Therapy - 03/21/21 0001     Subjective Pain hasnt been very bad, leg has been ichy    Patient and Family Stated Goals wound to heal    Date of Onset 09/06/20    Prior Treatments MD and self care    Pain Scale 0-10    Pain Score 0-No pain    Evaluation and Treatment Procedures Explained to Patient/Family Yes    Evaluation and Treatment Procedures agreed to    Wound Properties Date First Assessed: 12/04/20 Time First Assessed: 7628 Wound Type: Venous stasis ulcer Location: Leg Location Orientation: Right Present on Admission: Yes   Wound Image Images linked: 1    Dressing Changed Changed    Dressing Status Old drainage    Dressing Change Frequency PRN    Site / Wound Assessment Granulation tissue;Yellow    %  Wound base Red or Granulating 90%    % Wound base Yellow/Fibrinous Exudate 10%    Wound Length (cm) 3 cm    Wound Width (cm) 2.3 cm    Wound Depth (cm) 0.3 cm    Wound Volume (cm^3) 2.07 cm^3    Wound Surface Area (cm^2) 6.9 cm^2    Margins Attached edges (approximated)    Drainage Amount Minimal    Drainage Description Serous    Treatment Cleansed;Debridement (Selective)    Selective Debridement - Location wound bed    Selective Debridement - Tools Used Scalpel;Forceps    Selective Debridement - Tissue Removed slough    Wound Therapy - Clinical Statement Patient with increased redness today throughout RLE with small drainage spots on old dressing throughout foot, ankle, and shank. Discussed patients possible need for antibiotics with wife and patient and educated her on calling MD during session. Patient with continued adherent slough in wound bed. Debridement time limited today as they needed to leave for MD appointment. Did not reapply dressing as patient was leaving for MD to assess leg. Patient  offered later time spot to return for dressing if MD office does not redress. Patient has met 1/1 short term goals and 0/2 long term goals at this time with continued wound and not able to perform skin checks daily at this time due to continued dressings needed. Patient is showing decreasing slough in wound bed and decreasing wound size compared to prior sessions. Patient will continue to benefit from skilled physical therapy in order to promote wound healing.    Wound Therapy - Functional Problem List gait and balance    Factors Delaying/Impairing Wound Healing Diabetes Mellitus;Vascular compromise;Altered sensation    Wound Therapy - Frequency 3X / week    Wound Plan Continue with coban for edema control.  Assess granulation and use appropriate dressings.  measure and photograph at end of week each week.  May want to add back foam if continues to slide down    Dressing  --                       PT Short Term Goals - 02/25/21 1502       PT SHORT TERM GOAL #1   Title Patient will have at least 50% granualtion tissue.    Time 3    Period Weeks    Status Achieved    Target Date 12/25/20               PT Long Term Goals - 01/03/21 1344       PT LONG TERM GOAL #1   Title Patient's wound will be healed to reduce the risk of infection.    Time 6    Period Weeks    Status On-going      PT LONG TERM GOAL #2   Title Patient will be able to verbalize completion of daily skin checks to reduce the risk of further wound development.    Time 6    Period Weeks    Status On-going                   Plan - 03/21/21 1446     Clinical Impression Statement see above    Personal Factors and Comorbidities Age;Comorbidity 3+;Past/Current Experience;Fitness;Time since onset of injury/illness/exacerbation    Comorbidities DM, CKD, neuorpathy, Hx CVA    Examination-Activity Limitations Locomotion Level;Transfers;Squat;Stand;Dressing    Examination-Participation  Restrictions Community Activity    Stability/Clinical Decision  Making Stable/Uncomplicated    Rehab Potential Good    PT Frequency 3x / week    PT Duration 6 weeks   increased time secondary to slow healing wound   PT Treatment/Interventions Gait training;Stair training;Functional mobility training;DME Instruction;Therapeutic activities;Therapeutic exercise;Balance training;Neuromuscular re-education;Patient/family education;Compression bandaging;Manual lymph drainage;Manual techniques    PT Next Visit Plan Continue with both chemical and mechanical debridement to remove adherent eschar to allow healing to occure    Consulted and Agree with Plan of Care Patient;Family member/caregiver    Family Member Consulted wife             Patient will benefit from skilled therapeutic intervention in order to improve the following deficits and impairments:  Abnormal gait, Decreased skin integrity, Decreased mobility, Impaired sensation  Visit Diagnosis: Ulcer of right pretibial region, limited to breakdown of skin (HCC)  Other abnormalities of gait and mobility     Problem List Patient Active Problem List   Diagnosis Date Noted   Advanced nonexudative age-related macular degeneration of right eye without subfoveal involvement 11/15/2020   Advanced nonexudative age-related macular degeneration of left eye without subfoveal involvement 11/15/2020   Diabetes mellitus without complication ([place]) 54/36/0677   Posterior vitreous detachment of both eyes 11/15/2020   Gait disturbance, post-stroke 11/02/2017   Aphasia as late effect of cerebrovascular accident 11/02/2017   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) 01/10/2017   Atrial fibrillation with RVR ([place]) 01/10/2017   Claudication ([place]) 07/25/2016   Paroxysmal atrial fibrillation ([place]) 07/25/2016   Odynophagia 04/10/2014   Allergic rhinitis 03/23/2014   Chronic rhinitis 12/22/2013   Intrinsic asthma 07/15/2013   Constipation 08/22/2011   Obesity 09/20/2009   G E R D 11/29/2007   Celiac disease 11/29/2007    2:57 PM, 03/21/21 Mearl Latin PT, DPT Physical Therapist at [place], [place], [address] Phone: [phone]   Fax:  [phone]  Name: [name] MRN: [id number] Date of Birth: 02-Feb-1929

## 2021-03-22 ENCOUNTER — Ambulatory Visit (HOSPITAL_COMMUNITY): Payer: PPO

## 2021-03-22 ENCOUNTER — Encounter (HOSPITAL_COMMUNITY): Payer: Self-pay

## 2021-03-22 DIAGNOSIS — R2689 Other abnormalities of gait and mobility: Secondary | ICD-10-CM | POA: Diagnosis not present

## 2021-03-22 DIAGNOSIS — M1712 Unilateral primary osteoarthritis, left knee: Secondary | ICD-10-CM | POA: Diagnosis not present

## 2021-03-22 DIAGNOSIS — L97811 Non-pressure chronic ulcer of other part of right lower leg limited to breakdown of skin: Secondary | ICD-10-CM

## 2021-03-22 NOTE — Therapy (Signed)
[place], [place], [address] Phone: [phone]   Fax:  [phone]  Wound Care Therapy  Patient Details  Name: [name] MRN: [id number] Date of Birth: January 02, 1929 Referring Provider (PT): [name]   Encounter Date: 03/22/2021   PT End of Session - 03/22/21 1308     Visit Number 40    Number of Visits 78    Date for PT Re-Evaluation 04/08/21    Authorization Type [employer] (no auth req, no visit limit)    Progress Note Due on Visit 49    PT Start Time 1140    PT Stop Time 1220    PT Time Calculation (min) 40 min    Activity Tolerance Patient tolerated treatment well    Behavior During Therapy [facility] for tasks assessed/performed             Past Medical History:  Diagnosis Date   Allergic rhinitis    Asthmatic bronchitis    Atrial fibrillation ([place])    Celiac disease    Cellulitis    CKD (chronic kidney disease)    Diabetes ([place])    Diverticulosis    Fx. left wrist 1987   Gastric polyps    GERD (gastroesophageal reflux disease)    History of pneumonia    Hypertension    Hypothyroidism    Iron deficiency anemia    Melanoma ([place])    Neuropathy    PAF (paroxysmal atrial fibrillation) ([place])    Prostate cancer ([place])    REM sleep behavior disorder    RLS (restless legs syndrome)    Sleep apnea    Spasticity    Stroke ([place]) 09/2017   Tremor    Type 2 diabetes mellitus ([place])     Past Surgical History:  Procedure Laterality Date   APPENDECTOMY  1978   CARPAL TUNNEL RELEASE Left    CATARACT EXTRACTION  03/2011   bilateral    CHOLECYSTECTOMY  2001   COLONOSCOPY W/ BIOPSIES  04/27/2008   diverticulosis, duodenitis   FOOT SURGERY     HERNIA REPAIR  1994   bilateral   KNEE SURGERY Bilateral    x 2   LUNG SURGERY  2001   for infection   PROSTATECTOMY  2002   UPPER GASTROINTESTINAL ENDOSCOPY  04/27/2008   celiac disease, gastric polyps    There were no vitals filed for  this visit.    Subjective Assessment - 03/22/21 1300     Subjective Wife reports MD hesitant to give antibiotics as has been on antibiotics previously.  Pt arrived with new dressings intact.  Pain minimal, maybe 2/10.    Currently in Pain? Yes    Pain Score 2     Pain Location Leg    Pain Orientation Right;Lateral    Pain Descriptors / Indicators Burning    Pain Type Acute pain    Pain Onset More than a month ago    Pain Frequency Intermittent                       Wound Therapy - 03/22/21 1300     Subjective Wife reports MD hesitant to give antibiotics as has been on antibiotics previously.  Pt arrived with new dressings intact.  Pain minimal, maybe 2/10.    Patient and Family Stated Goals wound to heal    Date of Onset 09/06/20    Prior Treatments MD and self care    Pain  Scale 0-10    Evaluation and Treatment Procedures Explained to Patient/Family Yes    Evaluation and Treatment Procedures agreed to    Wound Properties Date First Assessed: 12/04/20 Time First Assessed: 2703 Wound Type: Venous stasis ulcer Location: Leg Location Orientation: Right Present on Admission: Yes   Wound Image Images linked: 1    Dressing Type Gauze (Comment)   Santyl, vaseline, 2x2, gauze, Lymphedema liner, kerlix, coban, netting #5   Dressing Changed Changed    Dressing Status Old drainage    Dressing Change Frequency PRN    Site / Wound Assessment Granulation tissue    % Wound base Red or Granulating 90%    % Wound base Yellow/Fibrinous Exudate 10%    Wound Length (cm) 3 cm    Wound Width (cm) 2.3 cm    Wound Depth (cm) 0.2 cm    Wound Volume (cm^3) 1.38 cm^3    Wound Surface Area (cm^2) 6.9 cm^2    Margins Attached edges (approximated)    Drainage Amount Minimal    Drainage Description Serous    Treatment Cleansed;Debridement (Selective)    Selective Debridement - Location wound bed    Selective Debridement - Tools Used Scalpel;Forceps    Selective Debridement - Tissue  Removed slough    Wound Therapy - Clinical Statement LE macerated upon removal of dressings.  Continues to have rash on surrounding skin.  Selective debridment for removal of slough from wound bed and removal of dry/macerated skin perimeter.  Skin well moisturized with vaseline and lotion prior application of Santyl wound bed.  Xerofom placed over dry/red skin to assist with integrity.  No reoprts of increased pain through session.    Wound Therapy - Functional Problem List gait and balance    Factors Delaying/Impairing Wound Healing Diabetes Mellitus;Vascular compromise;Altered sensation    Wound Therapy - Frequency 3X / week    Wound Plan Continue with coban for edema control.  Assess granulation and use appropriate dressings.  measure and photograph at end of week each week.  May want to add back foam if continues to slide down    Dressing  Santyl, vaseline, 2x2, gauze, Lymphedema liner, kerlix, coban, netting #5                       PT Short Term Goals - 02/25/21 1502       PT SHORT TERM GOAL #1   Title Patient will have at least 50% granualtion tissue.    Time 3    Period Weeks    Status Achieved    Target Date 12/25/20               PT Long Term Goals - 01/03/21 1344       PT LONG TERM GOAL #1   Title Patient's wound will be healed to reduce the risk of infection.    Time 6    Period Weeks    Status On-going      PT LONG TERM GOAL #2   Title Patient will be able to verbalize completion of daily skin checks to reduce the risk of further wound development.    Time 6    Period Weeks    Status On-going                    Patient will benefit from skilled therapeutic intervention in order to improve the following deficits and impairments:     Visit Diagnosis: Ulcer of right pretibial region,  limited to breakdown of skin ([place])  Other abnormalities of gait and mobility     Problem List Patient Active Problem List   Diagnosis Date Noted    Advanced nonexudative age-related macular degeneration of right eye without subfoveal involvement 11/15/2020   Advanced nonexudative age-related macular degeneration of left eye without subfoveal involvement 11/15/2020   Diabetes mellitus without complication ([place]) 41/96/2229   Posterior vitreous detachment of both eyes 11/15/2020   Gait disturbance, post-stroke 11/02/2017   Aphasia as late effect of cerebrovascular accident 11/02/2017   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   Aphasia    PAF (paroxysmal atrial fibrillation) ([place])    Diabetes mellitus type 2 in obese ([place])    Benign essential HTN    Hypothyroidism    Hyperlipidemia    Stroke (cerebrum) ([place]) 10/02/2017   Afib () 01/10/2017   SOB (shortness of breath) 79/89/2119   Acute diastolic CHF (congestive heart failure) ([place]) 01/10/2017   Atrial fibrillation with RVR ([place]) 01/10/2017   Claudication ([place]) 07/25/2016   Paroxysmal atrial fibrillation ([place]) 07/25/2016   Odynophagia 04/10/2014   Allergic rhinitis 03/23/2014   Chronic rhinitis 12/22/2013   Intrinsic asthma 07/15/2013   Constipation 08/22/2011   Obesity 09/20/2009   G E R D 11/29/2007   Celiac disease 11/29/2007   Ihor Austin, LPTA/CLT; CBIS [phone]  Aldona Lento 03/22/2021, 1:09 PM  [facility] [place], [place], [address] Phone: [phone]   Fax:  [phone]  Name: ALBARO DEVINEY MRN: [id number] Date of Birth: 1929-07-03

## 2021-03-25 ENCOUNTER — Other Ambulatory Visit: Payer: Self-pay

## 2021-03-25 ENCOUNTER — Encounter (HOSPITAL_COMMUNITY): Payer: Self-pay | Admitting: Physical Therapy

## 2021-03-25 ENCOUNTER — Ambulatory Visit (HOSPITAL_COMMUNITY): Payer: PPO | Admitting: Physical Therapy

## 2021-03-25 DIAGNOSIS — R2689 Other abnormalities of gait and mobility: Secondary | ICD-10-CM

## 2021-03-25 DIAGNOSIS — L97811 Non-pressure chronic ulcer of other part of right lower leg limited to breakdown of skin: Secondary | ICD-10-CM

## 2021-03-25 NOTE — Therapy (Signed)
[place], [place], [address] Phone: [phone]   Fax:  [phone]  Wound Care Therapy  Patient Details  Name: [name] MRN: [id number] Date of Birth: 1929-03-11 Referring Provider (PT): [name]   Encounter Date: 03/25/2021   PT End of Session - 03/25/21 1359     Visit Number 41    Number of Visits 41    Date for PT Re-Evaluation 04/08/21    Authorization Type [employer] (no auth req, no visit limit)    Progress Note Due on Visit 5    PT Start Time 1400    PT Stop Time 1445    PT Time Calculation (min) 45 min    Activity Tolerance Patient tolerated treatment well    Behavior During Therapy [facility] for tasks assessed/performed             Past Medical History:  Diagnosis Date   Allergic rhinitis    Asthmatic bronchitis    Atrial fibrillation ([place])    Celiac disease    Cellulitis    CKD (chronic kidney disease)    Diabetes ([place])    Diverticulosis    Fx. left wrist 1987   Gastric polyps    GERD (gastroesophageal reflux disease)    History of pneumonia    Hypertension    Hypothyroidism    Iron deficiency anemia    Melanoma ([place])    Neuropathy    PAF (paroxysmal atrial fibrillation) ([place])    Prostate cancer ([place])    REM sleep behavior disorder    RLS (restless legs syndrome)    Sleep apnea    Spasticity    Stroke ([place]) 09/2017   Tremor    Type 2 diabetes mellitus ([place])     Past Surgical History:  Procedure Laterality Date   APPENDECTOMY  1978   CARPAL TUNNEL RELEASE Left    CATARACT EXTRACTION  03/2011   bilateral    CHOLECYSTECTOMY  2001   COLONOSCOPY W/ BIOPSIES  04/27/2008   diverticulosis, duodenitis   FOOT SURGERY     HERNIA REPAIR  1994   bilateral   KNEE SURGERY Bilateral    x 2   LUNG SURGERY  2001   for infection   PROSTATECTOMY  2002   UPPER GASTROINTESTINAL ENDOSCOPY  04/27/2008   celiac disease, gastric polyps    There were no vitals filed for  this visit.               Wound Therapy - 03/25/21 1516     Subjective Patient not having any pain in leg today.    Patient and Family Stated Goals wound to heal    Date of Onset 09/06/20    Prior Treatments MD and self care    Pain Scale 0-10    Pain Score 0-No pain    Evaluation and Treatment Procedures Explained to Patient/Family Yes    Evaluation and Treatment Procedures agreed to    Wound Properties Date First Assessed: 12/04/20 Time First Assessed: 6962 Wound Type: Venous stasis ulcer Location: Leg Location Orientation: Right Present on Admission: Yes   Dressing Type Gauze (Comment)   Santyl, vaseline, 2x2, gauze, Lymphedema liner, kerlix, coban, netting #5   Dressing Changed Changed    Dressing Status Old drainage    Dressing Change Frequency PRN    Site / Wound Assessment Granulation tissue    % Wound base Red or Granulating 90%    % Wound base Yellow/Fibrinous Exudate  10%    Margins Attached edges (approximated)    Drainage Amount Minimal    Drainage Description Serous    Treatment Cleansed;Debridement (Selective)    Selective Debridement - Location wound bed    Selective Debridement - Tools Used Scalpel;Forceps    Selective Debridement - Tissue Removed slough    Wound Therapy - Clinical Statement Patient showing decreased redness in RLE with no drainage on dressing from rest of leg. Patient with continued drainage from wound however. Patient tolerates sharps debridement well today which target margins and wound bed. Patient with minimal adherent slough remaining. Continued with santyl, lymph netting, kerlix, cotton, and coban. Patient will continue to benefit from skilled physical therapy in order to promote wound healing.    Wound Therapy - Functional Problem List gait and balance    Factors Delaying/Impairing Wound Healing Diabetes Mellitus;Vascular compromise;Altered sensation    Wound Therapy - Frequency 3X / week    Wound Plan Continue with coban for edema  control.  Assess granulation and use appropriate dressings.  measure and photograph at end of week each week.  May want to add back foam if continues to slide down    Dressing  Santyl, vaseline, 2x2, gauze, Lymphedema liner, kerlix, coban, netting #5                       PT Short Term Goals - 02/25/21 1502       PT SHORT TERM GOAL #1   Title Patient will have at least 50% granualtion tissue.    Time 3    Period Weeks    Status Achieved    Target Date 12/25/20               PT Long Term Goals - 01/03/21 1344       PT LONG TERM GOAL #1   Title Patient's wound will be healed to reduce the risk of infection.    Time 6    Period Weeks    Status On-going      PT LONG TERM GOAL #2   Title Patient will be able to verbalize completion of daily skin checks to reduce the risk of further wound development.    Time 6    Period Weeks    Status On-going                   Plan - 03/25/21 1359     Clinical Impression Statement see above    Personal Factors and Comorbidities Age;Comorbidity 3+;Past/Current Experience;Fitness;Time since onset of injury/illness/exacerbation    Comorbidities DM, CKD, neuorpathy, Hx CVA    Examination-Activity Limitations Locomotion Level;Transfers;Squat;Stand;Dressing    Examination-Participation Restrictions Community Activity    Stability/Clinical Decision Making Stable/Uncomplicated    Rehab Potential Good    PT Frequency 3x / week    PT Duration 6 weeks   increased time secondary to slow healing wound   PT Treatment/Interventions Gait training;Stair training;Functional mobility training;DME Instruction;Therapeutic activities;Therapeutic exercise;Balance training;Neuromuscular re-education;Patient/family education;Compression bandaging;Manual lymph drainage;Manual techniques    PT Next Visit Plan Continue with both chemical and mechanical debridement to remove adherent eschar to allow healing to occure    Consulted and Agree  with Plan of Care Patient;Family member/caregiver    Family Member Consulted wife             Patient will benefit from skilled therapeutic intervention in order to improve the following deficits and impairments:  Abnormal gait, Decreased skin integrity, Decreased mobility, Impaired sensation  Visit Diagnosis: Ulcer of right pretibial region, limited to breakdown of skin ([place])  Other abnormalities of gait and mobility     Problem List Patient Active Problem List   Diagnosis Date Noted   Advanced nonexudative age-related macular degeneration of right eye without subfoveal involvement 11/15/2020   Advanced nonexudative age-related macular degeneration of left eye without subfoveal involvement 11/15/2020   Diabetes mellitus without complication ([place]) 62/26/3335   Posterior vitreous detachment of both eyes 11/15/2020   Gait disturbance, post-stroke 11/02/2017   Aphasia as late effect of cerebrovascular accident 11/02/2017   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   Aphasia    PAF (paroxysmal atrial fibrillation) ([place])    Diabetes mellitus type 2 in obese ([place])    Benign essential HTN    Hypothyroidism    Hyperlipidemia    Stroke (cerebrum) ([place]) 10/02/2017   Afib ([place]) 01/10/2017   SOB (shortness of breath) 45/62/5638   Acute diastolic CHF (congestive heart failure) ([place]) 01/10/2017   Atrial fibrillation with RVR ([place]) 01/10/2017   Claudication ([place]) 07/25/2016   Paroxysmal atrial fibrillation ([place]) 07/25/2016   Odynophagia 04/10/2014   Allergic rhinitis 03/23/2014   Chronic rhinitis 12/22/2013   Intrinsic asthma 07/15/2013   Constipation 08/22/2011   Obesity 09/20/2009   G E R D 11/29/2007   Celiac disease 11/29/2007    3:20 PM, 03/25/21 Mearl Latin PT, DPT Physical Therapist at [place], [place], [address] Phone: [phone]   Fax:   [phone]  Name: CALEEL KINER MRN: [id number] Date of Birth: 04-18-1929

## 2021-03-27 ENCOUNTER — Ambulatory Visit (HOSPITAL_COMMUNITY): Payer: PPO

## 2021-03-27 ENCOUNTER — Other Ambulatory Visit: Payer: Self-pay

## 2021-03-27 ENCOUNTER — Encounter (HOSPITAL_COMMUNITY): Payer: Self-pay

## 2021-03-27 DIAGNOSIS — R2689 Other abnormalities of gait and mobility: Secondary | ICD-10-CM | POA: Diagnosis not present

## 2021-03-27 DIAGNOSIS — L97811 Non-pressure chronic ulcer of other part of right lower leg limited to breakdown of skin: Secondary | ICD-10-CM

## 2021-03-27 NOTE — Therapy (Signed)
[place], [place], [address] Phone: [phone]   Fax:  [phone]  Wound Care Therapy  Patient Details  Name: [name] MRN: [id number] Date of Birth: September 26, 1929 Referring Provider (PT): [name]   Encounter Date: 03/27/2021   PT End of Session - 03/27/21 1448     Visit Number 42    Number of Visits 47    Date for PT Re-Evaluation 04/08/21    Authorization Type [employer] (no auth req, no visit limit)    Progress Note Due on Visit 34    PT Start Time 1400    PT Stop Time 1440    PT Time Calculation (min) 40 min    Activity Tolerance Patient tolerated treatment well    Behavior During Therapy [facility] for tasks assessed/performed             Past Medical History:  Diagnosis Date   Allergic rhinitis    Asthmatic bronchitis    Atrial fibrillation ([place])    Celiac disease    Cellulitis    CKD (chronic kidney disease)    Diabetes ([place])    Diverticulosis    Fx. left wrist 1987   Gastric polyps    GERD (gastroesophageal reflux disease)    History of pneumonia    Hypertension    Hypothyroidism    Iron deficiency anemia    Melanoma ([place])    Neuropathy    PAF (paroxysmal atrial fibrillation) ([place])    Prostate cancer ([place])    REM sleep behavior disorder    RLS (restless legs syndrome)    Sleep apnea    Spasticity    Stroke ([place]) 09/2017   Tremor    Type 2 diabetes mellitus ([place])     Past Surgical History:  Procedure Laterality Date   APPENDECTOMY  1978   CARPAL TUNNEL RELEASE Left    CATARACT EXTRACTION  03/2011   bilateral    CHOLECYSTECTOMY  2001   COLONOSCOPY W/ BIOPSIES  04/27/2008   diverticulosis, duodenitis   FOOT SURGERY     HERNIA REPAIR  1994   bilateral   KNEE SURGERY Bilateral    x 2   LUNG SURGERY  2001   for infection   PROSTATECTOMY  2002   UPPER GASTROINTESTINAL ENDOSCOPY  04/27/2008   celiac disease, gastric polyps    There were no vitals filed for  this visit.    Subjective Assessment - 03/27/21 1442     Subjective Wife reports they received call from [facility] stating Juxtafit is ready.  Pt reports some soreness, pain scale 4/10.    Currently in Pain? Yes    Pain Score 4     Pain Location Leg    Pain Orientation Right;Lateral    Pain Descriptors / Indicators Burning    Pain Type Acute pain    Pain Onset More than a month ago    Pain Frequency Intermittent                       Wound Therapy - 03/27/21 1442     Subjective Wife reports they received call from [facility] stating Juxtafit is ready.  Pt reports some soreness, pain scale 4/10.    Patient and Family Stated Goals wound to heal    Date of Onset 09/06/20    Prior Treatments MD and self care    Pain Scale 0-10    Evaluation and Treatment Procedures Explained to Patient/Family  Yes    Evaluation and Treatment Procedures agreed to    Wound Properties Date First Assessed: 12/04/20 Time First Assessed: 6440 Wound Type: Venous stasis ulcer Location: Leg Location Orientation: Right Present on Admission: Yes   Wound Image Images linked: 1    Dressing Type Gauze (Comment)   Santyl, 2x2, ABD pad, lymphedema liner, coban, netting #5   Dressing Changed Changed    Dressing Status Old drainage    Dressing Change Frequency PRN    Site / Wound Assessment Granulation tissue    % Wound base Red or Granulating 95%    % Wound base Yellow/Fibrinous Exudate 5%    Wound Length (cm) 3 cm    Wound Width (cm) 2.3 cm    Wound Depth (cm) 0.2 cm    Wound Volume (cm^3) 1.38 cm^3    Wound Surface Area (cm^2) 6.9 cm^2    Margins Attached edges (approximated)    Drainage Amount Minimal    Drainage Description Serous    Treatment Cleansed;Debridement (Selective)    Selective Debridement - Location wound bed    Selective Debridement - Tools Used Scalpel;Forceps    Selective Debridement - Tissue Removed slough    Wound Therapy - Clinical Statement Improved skin integrity for all of Rt  LE with only a few small openings.  Wound bed presents with increased granulation and approximation especially from 7-10:00.  Continued wiht Santyl, gauze, lymphedema liner, coban and netting #5, reports of comfort at EOS.    Wound Therapy - Functional Problem List gait and balance    Factors Delaying/Impairing Wound Healing Diabetes Mellitus;Vascular compromise;Altered sensation    Wound Therapy - Frequency 3X / week    Wound Plan Pt to bring in Juxtafit when receives, instruct proper fitting.  Continue with selective debridement for removal of slough and edema control.  Skin sensitive so use lymphedema liner vs kerlix on skin.    Dressing  Santyl, vaseline, 2x2, gauze, Lymphedema liner, kerlix, coban, netting #5                       PT Short Term Goals - 02/25/21 1502       PT SHORT TERM GOAL #1   Title Patient will have at least 50% granualtion tissue.    Time 3    Period Weeks    Status Achieved    Target Date 12/25/20               PT Long Term Goals - 01/03/21 1344       PT LONG TERM GOAL #1   Title Patient's wound will be healed to reduce the risk of infection.    Time 6    Period Weeks    Status On-going      PT LONG TERM GOAL #2   Title Patient will be able to verbalize completion of daily skin checks to reduce the risk of further wound development.    Time 6    Period Weeks    Status On-going                    Patient will benefit from skilled therapeutic intervention in order to improve the following deficits and impairments:     Visit Diagnosis: Ulcer of right pretibial region, limited to breakdown of skin (HCC)  Other abnormalities of gait and mobility     Problem List Patient Active Problem List   Diagnosis Date Noted   Advanced nonexudative age-related macular degeneration  of right eye without subfoveal involvement 11/15/2020   Advanced nonexudative age-related macular degeneration of left eye without subfoveal  involvement 11/15/2020   Diabetes mellitus without complication ([place]) 16/07/9603   Posterior vitreous detachment of both eyes 11/15/2020   Gait disturbance, post-stroke 11/02/2017   Aphasia as late effect of cerebrovascular accident 11/02/2017   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) 01/10/2017   Claudication ([place]) 07/25/2016   Paroxysmal atrial fibrillation ([place]) 07/25/2016   Odynophagia 04/10/2014   Allergic rhinitis 03/23/2014   Chronic rhinitis 12/22/2013   Intrinsic asthma 07/15/2013   Constipation 08/22/2011   Obesity 09/20/2009   G E R D 11/29/2007   Celiac disease 11/29/2007   Ihor Austin, LPTA/CLT; CBIS [phone]  Aldona Lento 03/27/2021, 2:49 PM  [place], [place], [address] Phone: [phone]   Fax:  [phone]  Name: [name] MRN: [id number] Date of Birth: 31-Oct-1928

## 2021-03-29 ENCOUNTER — Other Ambulatory Visit: Payer: Self-pay

## 2021-03-29 ENCOUNTER — Encounter (HOSPITAL_COMMUNITY): Payer: Self-pay | Admitting: Physical Therapy

## 2021-03-29 ENCOUNTER — Ambulatory Visit (HOSPITAL_COMMUNITY): Payer: PPO | Admitting: Physical Therapy

## 2021-03-29 DIAGNOSIS — R600 Localized edema: Secondary | ICD-10-CM | POA: Diagnosis not present

## 2021-03-29 DIAGNOSIS — R2689 Other abnormalities of gait and mobility: Secondary | ICD-10-CM | POA: Diagnosis not present

## 2021-03-29 DIAGNOSIS — L97811 Non-pressure chronic ulcer of other part of right lower leg limited to breakdown of skin: Secondary | ICD-10-CM | POA: Diagnosis not present

## 2021-03-29 DIAGNOSIS — E1140 Type 2 diabetes mellitus with diabetic neuropathy, unspecified: Secondary | ICD-10-CM | POA: Diagnosis not present

## 2021-03-29 NOTE — Therapy (Signed)
[place], [place], [address] Phone: [phone]   Fax:  [phone]  Wound Care Therapy  Patient Details  Name: [name] MRN: [id number] Date of Birth: [birth year] Referring Provider (PT): [name]   Encounter Date: 03/29/2021   PT End of Session - 03/29/21 1609     Visit Number 43    Number of Visits 80    Date for PT Re-Evaluation 04/08/21    Authorization Type [employer] (no auth req, no visit limit)    Progress Note Due on Visit 28    PT Start Time 1530    PT Stop Time 1610    PT Time Calculation (min) 40 min    Activity Tolerance Patient tolerated treatment well    Behavior During Therapy [facility] for tasks assessed/performed             Past Medical History:  Diagnosis Date   Allergic rhinitis    Asthmatic bronchitis    Atrial fibrillation ([place])    Celiac disease    Cellulitis    CKD (chronic kidney disease)    Diabetes ([place])    Diverticulosis    Fx. left wrist 1987   Gastric polyps    GERD (gastroesophageal reflux disease)    History of pneumonia    Hypertension    Hypothyroidism    Iron deficiency anemia    Melanoma ([place])    Neuropathy    PAF (paroxysmal atrial fibrillation) ([place])    Prostate cancer ([place])    REM sleep behavior disorder    RLS (restless legs syndrome)    Sleep apnea    Spasticity    Stroke ([place]) 09/2017   Tremor    Type 2 diabetes mellitus ([place])     Past Surgical History:  Procedure Laterality Date   APPENDECTOMY  1978   CARPAL TUNNEL RELEASE Left    CATARACT EXTRACTION  03/2011   bilateral    CHOLECYSTECTOMY  2001   COLONOSCOPY W/ BIOPSIES  04/27/2008   diverticulosis, duodenitis   FOOT SURGERY     HERNIA REPAIR  1994   bilateral   KNEE SURGERY Bilateral    x 2   LUNG SURGERY  2001   for infection   PROSTATECTOMY  2002   UPPER GASTROINTESTINAL ENDOSCOPY  04/27/2008   celiac disease, gastric polyps    There were no vitals filed for  this visit.    Subjective Assessment - 03/29/21 1608     Subjective see above    Pain Onset More than a month ago              [facility] PT Assessment - 03/29/21 0001       Assessment   Medical Diagnosis Chronic venous stasis wound    Referring Provider (PT) [name]                     Wound Therapy - 03/29/21 0001     Subjective States he doesn't have a lot of pain, dressing was comforatable. Reports that juxtafit came in.    Patient and Family Stated Goals wound to heal    Date of Onset 09/06/20    Prior Treatments MD and self care    Evaluation and Treatment Procedures Explained to Patient/Family Yes    Evaluation and Treatment Procedures agreed to    Wound Properties Date First Assessed: 12/04/20 Time First Assessed: 8101 Wound Type: Venous stasis ulcer Location: Leg Location Orientation: Right  Present on Admission: Yes   Wound Image Images linked: 1    Dressing Type Gauze (Comment);Compression wrap   santyl   Dressing Changed Changed    Dressing Status Old drainage    Dressing Change Frequency PRN    Site / Wound Assessment Granulation tissue;Bleeding    % Wound base Red or Granulating 90%    % Wound base Yellow/Fibrinous Exudate 10%    Wound Length (cm) 2.8 cm   was 3   Wound Width (cm) 2.4 cm   was 2.3   Wound Depth (cm) 0.2 cm   was .2   Wound Volume (cm^3) 1.34 cm^3    Wound Surface Area (cm^2) 6.72 cm^2    Margins Attached edges (approximated)    Drainage Amount Minimal    Drainage Description Serosanguineous    Treatment Cleansed;Debridement (Selective)    Selective Debridement - Location wound bed    Selective Debridement - Tools Used Scalpel;Forceps    Selective Debridement - Tissue Removed slough    Wound Therapy - Clinical Statement Continued improvement in approximation of wound. Rash continues surrounding wound bed. Continued with cleansing and applying xeroform to this area. Took picture of rash to monitor. Continued wtih lymph nettign and  coban. Educated patient on proper juxtafit fitting on left leg. Will continue with POC as tolerated.    Wound Therapy - Functional Problem List gait and balance    Factors Delaying/Impairing Wound Healing Diabetes Mellitus;Vascular compromise;Altered sensation    Wound Therapy - Frequency 3X / week    Wound Plan Instructed patient and wife in proper fitting of Juxtafit on left leg.  Continue with selective debridement for removal of slough and edema control.  Skin sensitive so use lymphedema liner vs kerlix on skin.    Dressing  Santyl, hydrogel, 2x2,  vaseline, xerform to rash, kerlix, Lymphedema liner, coban, netting #5                       PT Short Term Goals - 02/25/21 1502       PT SHORT TERM GOAL #1   Title Patient will have at least 50% granualtion tissue.    Time 3    Period Weeks    Status Achieved    Target Date 12/25/20               PT Long Term Goals - 01/03/21 1344       PT LONG TERM GOAL #1   Title Patient's wound will be healed to reduce the risk of infection.    Time 6    Period Weeks    Status On-going      PT LONG TERM GOAL #2   Title Patient will be able to verbalize completion of daily skin checks to reduce the risk of further wound development.    Time 6    Period Weeks    Status On-going                   Plan - 03/29/21 1609     Clinical Impression Statement see above    Personal Factors and Comorbidities Age;Comorbidity 3+;Past/Current Experience;Fitness;Time since onset of injury/illness/exacerbation    Comorbidities DM, CKD, neuorpathy, Hx CVA    Examination-Activity Limitations Locomotion Level;Transfers;Squat;Stand;Dressing    Examination-Participation Restrictions Community Activity    Stability/Clinical Decision Making Stable/Uncomplicated    Rehab Potential Good    PT Frequency 3x / week    PT Duration 6 weeks   increased time secondary  to slow healing wound   PT Treatment/Interventions Gait training;Stair  training;Functional mobility training;DME Instruction;Therapeutic activities;Therapeutic exercise;Balance training;Neuromuscular re-education;Patient/family education;Compression bandaging;Manual lymph drainage;Manual techniques    PT Next Visit Plan Continue with both chemical and mechanical debridement to remove adherent eschar to allow healing to occure    Consulted and Agree with Plan of Care Patient;Family member/caregiver    Family Member Consulted wife             Patient will benefit from skilled therapeutic intervention in order to improve the following deficits and impairments:  Abnormal gait, Decreased skin integrity, Decreased mobility, Impaired sensation  Visit Diagnosis: Ulcer of right pretibial region, limited to breakdown of skin (HCC)  Other abnormalities of gait and mobility     Problem List Patient Active Problem List   Diagnosis Date Noted   Advanced nonexudative age-related macular degeneration of right eye without subfoveal involvement 11/15/2020   Advanced nonexudative age-related macular degeneration of left eye without subfoveal involvement 11/15/2020   Diabetes mellitus without complication ([place]) 86/38/1771   Posterior vitreous detachment of both eyes 11/15/2020   Gait disturbance, post-stroke 11/02/2017   Aphasia as late effect of cerebrovascular accident 11/02/2017   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   Aphasia    PAF (paroxysmal atrial fibrillation) ([place])    Diabetes mellitus type 2 in obese ([place])    Benign essential HTN    Hypothyroidism    Hyperlipidemia    Stroke (cerebrum) ([place]) 10/02/2017   Afib ([place]) 01/10/2017   SOB (shortness of breath) 16/57/9038   Acute diastolic CHF (congestive heart failure) ([place]) 01/10/2017   Atrial fibrillation with RVR ([place]) 01/10/2017   Claudication ([place]) 07/25/2016   Paroxysmal atrial fibrillation ([place]) 07/25/2016   Odynophagia 04/10/2014   Allergic rhinitis 03/23/2014   Chronic rhinitis  12/22/2013   Intrinsic asthma 07/15/2013   Constipation 08/22/2011   Obesity 09/20/2009   G E R D 11/29/2007   Celiac disease 11/29/2007    4:15 PM, 03/29/21 Jerene Pitch, DPT Physical Therapy with [facility]  [phone] office   [place] [address] [place], [place], [address] Phone: [phone]   Fax:  [phone]  Name: [name] MRN: [id number] Date of Birth: [birth year]

## 2021-04-01 ENCOUNTER — Encounter (HOSPITAL_COMMUNITY): Payer: Self-pay | Admitting: Physical Therapy

## 2021-04-01 ENCOUNTER — Ambulatory Visit (HOSPITAL_COMMUNITY): Payer: PPO | Admitting: Physical Therapy

## 2021-04-01 ENCOUNTER — Other Ambulatory Visit: Payer: Self-pay

## 2021-04-01 DIAGNOSIS — L97811 Non-pressure chronic ulcer of other part of right lower leg limited to breakdown of skin: Secondary | ICD-10-CM

## 2021-04-01 DIAGNOSIS — R2689 Other abnormalities of gait and mobility: Secondary | ICD-10-CM | POA: Diagnosis not present

## 2021-04-01 NOTE — Therapy (Signed)
[place], [place], [address] Phone: [phone]   Fax:  [phone]  Wound Care Therapy  Patient Details  Name: [name] MRN: [id number] Date of Birth: [birth year] Referring Provider (PT): [name]   Encounter Date: 04/01/2021   PT End of Session - 04/01/21 1453     Visit Number 37    Number of Visits 48    Date for PT Re-Evaluation 04/08/21    Authorization Type [employer] (no auth req, no visit limit)    Progress Note Due on Visit 44    PT Start Time 1403    PT Stop Time 1447    PT Time Calculation (min) 44 min    Activity Tolerance Patient tolerated treatment well    Behavior During Therapy [facility] for tasks assessed/performed             Past Medical History:  Diagnosis Date   Allergic rhinitis    Asthmatic bronchitis    Atrial fibrillation ([place])    Celiac disease    Cellulitis    CKD (chronic kidney disease)    Diabetes ([place])    Diverticulosis    Fx. left wrist 1987   Gastric polyps    GERD (gastroesophageal reflux disease)    History of pneumonia    Hypertension    Hypothyroidism    Iron deficiency anemia    Melanoma ([place])    Neuropathy    PAF (paroxysmal atrial fibrillation) ([place])    Prostate cancer ([place])    REM sleep behavior disorder    RLS (restless legs syndrome)    Sleep apnea    Spasticity    Stroke ([place]) 09/2017   Tremor    Type 2 diabetes mellitus ([place])     Past Surgical History:  Procedure Laterality Date   APPENDECTOMY  1978   CARPAL TUNNEL RELEASE Left    CATARACT EXTRACTION  03/2011   bilateral    CHOLECYSTECTOMY  2001   COLONOSCOPY W/ BIOPSIES  04/27/2008   diverticulosis, duodenitis   FOOT SURGERY     HERNIA REPAIR  1994   bilateral   KNEE SURGERY Bilateral    x 2   LUNG SURGERY  2001   for infection   PROSTATECTOMY  2002   UPPER GASTROINTESTINAL ENDOSCOPY  04/27/2008   celiac disease, gastric polyps    There were no vitals filed for  this visit.               Wound Therapy - 04/01/21 0001     Subjective No pain. leg is feeling good    Patient and Family Stated Goals wound to heal    Date of Onset 09/06/20    Prior Treatments MD and self care    Evaluation and Treatment Procedures Explained to Patient/Family Yes    Evaluation and Treatment Procedures agreed to    Wound Properties Date First Assessed: 12/04/20 Time First Assessed: 2836 Wound Type: Venous stasis ulcer Location: Leg Location Orientation: Right Present on Admission: Yes   Dressing Type Gauze (Comment);Compression wrap    Dressing Changed Changed    Dressing Status Old drainage    Dressing Change Frequency PRN    Site / Wound Assessment Granulation tissue;Yellow    % Wound base Red or Granulating 90%    % Wound base Yellow/Fibrinous Exudate 10%    Margins Attached edges (approximated)    Drainage Amount Minimal    Drainage Description Serosanguineous    Treatment Cleansed;Debridement (  Selective)    Selective Debridement - Location wound bed    Selective Debridement - Tools Used Forceps    Selective Debridement - Tissue Removed slough    Wound Therapy - Clinical Statement Patient with 3 small areas in proximal anterior tibial region with broken skin where rash was in prior sessions. Cleansed and applied lotion to RLE as skin was very dry and flaky. Patient tolerates debridement of slough from wound bed. Applied Vaseline to RLE and around wound bed. Continued with santyl to wound bed and xeroform to areas of rash/small spots of broken skin on RLE. Applied lymph liner, kerlix, coban and #5 netting on top. Patient states leg feeling good at end of session. Patient will continue to benefit from skilled physical therapy in order to promote wound healing    Wound Therapy - Functional Problem List gait and balance    Factors Delaying/Impairing Wound Healing Diabetes Mellitus;Vascular compromise;Altered sensation    Wound Therapy - Frequency 3X / week     Wound Plan Instructed patient and wife in proper fitting of Juxtafit on left leg.  Continue with selective debridement for removal of slough and edema control.  Skin sensitive so use lymphedema liner vs kerlix on skin.    Dressing  Santyl, hydrogel, 2x2,  vaseline, xerform to rash, Lymphedema liner, kerlix, cotton at ankle, coban, netting #5                       PT Short Term Goals - 02/25/21 1502       PT SHORT TERM GOAL #1   Title Patient will have at least 50% granualtion tissue.    Time 3    Period Weeks    Status Achieved    Target Date 12/25/20               PT Long Term Goals - 01/03/21 1344       PT LONG TERM GOAL #1   Title Patient's wound will be healed to reduce the risk of infection.    Time 6    Period Weeks    Status On-going      PT LONG TERM GOAL #2   Title Patient will be able to verbalize completion of daily skin checks to reduce the risk of further wound development.    Time 6    Period Weeks    Status On-going                   Plan - 04/01/21 1454     Clinical Impression Statement see above    Personal Factors and Comorbidities Age;Comorbidity 3+;Past/Current Experience;Fitness;Time since onset of injury/illness/exacerbation    Comorbidities DM, CKD, neuorpathy, Hx CVA    Examination-Activity Limitations Locomotion Level;Transfers;Squat;Stand;Dressing    Examination-Participation Restrictions Community Activity    Stability/Clinical Decision Making Stable/Uncomplicated    Rehab Potential Good    PT Frequency 3x / week    PT Duration 6 weeks   increased time secondary to slow healing wound   PT Treatment/Interventions Gait training;Stair training;Functional mobility training;DME Instruction;Therapeutic activities;Therapeutic exercise;Balance training;Neuromuscular re-education;Patient/family education;Compression bandaging;Manual lymph drainage;Manual techniques    PT Next Visit Plan Continue with both chemical and  mechanical debridement to remove adherent eschar to allow healing to occure    Consulted and Agree with Plan of Care Patient;Family member/caregiver    Family Member Consulted wife             Patient will benefit from skilled therapeutic intervention in order  to improve the following deficits and impairments:  Abnormal gait, Decreased skin integrity, Decreased mobility, Impaired sensation  Visit Diagnosis: Ulcer of right pretibial region, limited to breakdown of skin ([place])  Other abnormalities of gait and mobility     Problem List Patient Active Problem List   Diagnosis Date Noted   Advanced nonexudative age-related macular degeneration of right eye without subfoveal involvement 11/15/2020   Advanced nonexudative age-related macular degeneration of left eye without subfoveal involvement 11/15/2020   Diabetes mellitus without complication ([place]) 44/31/5400   Posterior vitreous detachment of both eyes 11/15/2020   Gait disturbance, post-stroke 11/02/2017   Aphasia as late effect of cerebrovascular accident 11/02/2017   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   Aphasia    PAF (paroxysmal atrial fibrillation) ([place])    Diabetes mellitus type 2 in obese ([place])    Benign essential HTN    Hypothyroidism    Hyperlipidemia    Stroke (cerebrum) ([place]) 10/02/2017   Afib ([place]) 01/10/2017   SOB (shortness of breath) 86/76/1950   Acute diastolic CHF (congestive heart failure) ([place]) 01/10/2017   Atrial fibrillation with RVR ([place]) 01/10/2017   Claudication ([place]) 07/25/2016   Paroxysmal atrial fibrillation ([place]) 07/25/2016   Odynophagia 04/10/2014   Allergic rhinitis 03/23/2014   Chronic rhinitis 12/22/2013   Intrinsic asthma 07/15/2013   Constipation 08/22/2011   Obesity 09/20/2009   G E R D 11/29/2007   Celiac disease 11/29/2007   3:05 PM, 04/01/21 Mearl Latin PT, DPT Physical Therapist at [place], [place], [address] Phone: [phone]   Fax:  [phone]  Name: [name] MRN: [id number] Date of Birth: 02-02-1929

## 2021-04-03 ENCOUNTER — Ambulatory Visit (HOSPITAL_COMMUNITY): Payer: PPO | Admitting: Physical Therapy

## 2021-04-03 ENCOUNTER — Other Ambulatory Visit: Payer: Self-pay

## 2021-04-03 DIAGNOSIS — R2689 Other abnormalities of gait and mobility: Secondary | ICD-10-CM

## 2021-04-03 DIAGNOSIS — L97811 Non-pressure chronic ulcer of other part of right lower leg limited to breakdown of skin: Secondary | ICD-10-CM

## 2021-04-03 NOTE — Therapy (Signed)
[place], [place], [address] Phone: [phone]   Fax:  [phone]  Wound Care Therapy  Patient Details  Name: [name] MRN: [id number] Date of Birth: 1928-12-25 Referring Provider (PT): [name]   Encounter Date: 04/03/2021   PT End of Session - 04/03/21 1539     Visit Number 86    Number of Visits 78    Date for PT Re-Evaluation 04/08/21    Authorization Type [employer] (no auth req, no visit limit)    Progress Note Due on Visit 38    PT Start Time 1404    PT Stop Time 1445    PT Time Calculation (min) 41 min    Activity Tolerance Patient tolerated treatment well    Behavior During Therapy [facility] for tasks assessed/performed             Past Medical History:  Diagnosis Date   Allergic rhinitis    Asthmatic bronchitis    Atrial fibrillation ([place])    Celiac disease    Cellulitis    CKD (chronic kidney disease)    Diabetes ([place])    Diverticulosis    Fx. left wrist 1987   Gastric polyps    GERD (gastroesophageal reflux disease)    History of pneumonia    Hypertension    Hypothyroidism    Iron deficiency anemia    Melanoma ([place])    Neuropathy    PAF (paroxysmal atrial fibrillation) ([place])    Prostate cancer ([place])    REM sleep behavior disorder    RLS (restless legs syndrome)    Sleep apnea    Spasticity    Stroke ([place]) 09/2017   Tremor    Type 2 diabetes mellitus ([place])     Past Surgical History:  Procedure Laterality Date   APPENDECTOMY  1978   CARPAL TUNNEL RELEASE Left    CATARACT EXTRACTION  03/2011   bilateral    CHOLECYSTECTOMY  2001   COLONOSCOPY W/ BIOPSIES  04/27/2008   diverticulosis, duodenitis   FOOT SURGERY     HERNIA REPAIR  1994   bilateral   KNEE SURGERY Bilateral    x 2   LUNG SURGERY  2001   for infection   PROSTATECTOMY  2002   UPPER GASTROINTESTINAL ENDOSCOPY  04/27/2008   celiac disease, gastric polyps    There were no vitals filed for  this visit.               Wound Therapy - 04/03/21 0001     Subjective called and left message on VM to bring juxta for Rt LE, however did not get message.  Pt reports increased itchiness top of LE and slightly increased edema.    Patient and Family Stated Goals wound to heal    Date of Onset 09/06/20    Prior Treatments MD and self care    Pain Scale 0-10    Pain Score 0-No pain    Evaluation and Treatment Procedures Explained to Patient/Family Yes    Evaluation and Treatment Procedures agreed to    Wound Properties Date First Assessed: 12/04/20 Time First Assessed: 0277 Wound Type: Venous stasis ulcer Location: Leg Location Orientation: Right Present on Admission: Yes   Dressing Type Gauze (Comment)    Dressing Changed Changed    Dressing Status Old drainage    Dressing Change Frequency PRN    Site / Wound Assessment Granulation tissue;Yellow    % Wound base Red or  Granulating 90%    % Wound base Yellow/Fibrinous Exudate 10%    Margins Attached edges (approximated)    Drainage Amount Minimal    Drainage Description Serosanguineous    Treatment Cleansed;Debridement (Selective)    Selective Debridement - Location wound bed    Selective Debridement - Tools Used Forceps    Selective Debridement - Tissue Removed slough    Wound Therapy - Clinical Statement Pt with increased rashy area above where coban ends and some bottlenecking from dressing sliding down.  Cleansed LE well and moisturized.  Wound is slowly improving and approximating.  Film of slough easily debrided from surface and continued with santyl followed by hydrogel gauze to maintain moisture.  Broken areas of skin much improved.  Covered remaining areas with xerform.  Instructed wife and pt to bring his juxtafit next session.  Both were also instructed to remove juxtafits prior to using pump    Wound Therapy - Functional Problem List gait and balance    Factors Delaying/Impairing Wound Healing Diabetes  Mellitus;Vascular compromise;Altered sensation    Wound Therapy - Frequency 3X / week    Wound Plan Continue with selective debridement for removal of slough and edema control.  Skin sensitive so use lymphedema liner vs kerlix on skin.  Remeasure/photograph on Friday; F/U with pump use and removal of juxtafit.  Begin using juxtafit over bandage on Rt LE.    Dressing  Santyl, hydrogel, 2x2,  vaseline, xerform to rash, Lymphedema liner, kerlix, cotton at ankle, coban, netting #5                       PT Short Term Goals - 02/25/21 1502       PT SHORT TERM GOAL #1   Title Patient will have at least 50% granualtion tissue.    Time 3    Period Weeks    Status Achieved    Target Date 12/25/20               PT Long Term Goals - 01/03/21 1344       PT LONG TERM GOAL #1   Title Patient's wound will be healed to reduce the risk of infection.    Time 6    Period Weeks    Status On-going      PT LONG TERM GOAL #2   Title Patient will be able to verbalize completion of daily skin checks to reduce the risk of further wound development.    Time 6    Period Weeks    Status On-going                    Patient will benefit from skilled therapeutic intervention in order to improve the following deficits and impairments:     Visit Diagnosis: Ulcer of right pretibial region, limited to breakdown of skin ([place])  Other abnormalities of gait and mobility     Problem List Patient Active Problem List   Diagnosis Date Noted   Advanced nonexudative age-related macular degeneration of right eye without subfoveal involvement 11/15/2020   Advanced nonexudative age-related macular degeneration of left eye without subfoveal involvement 11/15/2020   Diabetes mellitus without complication ([place]) 67/34/1937   Posterior vitreous detachment of both eyes 11/15/2020   Gait disturbance, post-stroke 11/02/2017   Aphasia as late effect of cerebrovascular accident 11/02/2017    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   Aphasia    PAF (paroxysmal atrial fibrillation) ([facility])    Diabetes mellitus  type 2 in obese [facility])    Benign essential HTN    Hypothyroidism    Hyperlipidemia    Stroke (cerebrum) ([place]) 10/02/2017   Afib ([place]) 01/10/2017   SOB (shortness of breath) 07/46/0029   Acute diastolic CHF (congestive heart failure) ([place]) 01/10/2017   Atrial fibrillation with RVR ([place]) 01/10/2017   Claudication ([place]) 07/25/2016   Paroxysmal atrial fibrillation ([place]) 07/25/2016   Odynophagia 04/10/2014   Allergic rhinitis 03/23/2014   Chronic rhinitis 12/22/2013   Intrinsic asthma 07/15/2013   Constipation 08/22/2011   Obesity 09/20/2009   G E R D 11/29/2007   Celiac disease 11/29/2007   Teena Irani, PTA/CLT [phone]  Teena Irani 04/03/2021, 3:40 PM  [facility] [address] [place], [place], [address] Phone: [phone]   Fax:  [phone]  Name: [name] MRN: [id number] Date of Birth: 08/23/1929

## 2021-04-05 ENCOUNTER — Other Ambulatory Visit: Payer: Self-pay

## 2021-04-05 ENCOUNTER — Ambulatory Visit (HOSPITAL_COMMUNITY): Payer: PPO

## 2021-04-05 DIAGNOSIS — L97811 Non-pressure chronic ulcer of other part of right lower leg limited to breakdown of skin: Secondary | ICD-10-CM

## 2021-04-05 DIAGNOSIS — R2689 Other abnormalities of gait and mobility: Secondary | ICD-10-CM

## 2021-04-05 NOTE — Therapy (Signed)
[place], [place], [address] Phone: [phone]   Fax:  [phone]  Wound Care Therapy  Patient Details  Name: [name] MRN: [id number] Date of Birth: 03-03-1929 Referring Provider (PT): [name]   Encounter Date: 04/05/2021   PT End of Session - 04/05/21 1456     Visit Number 11    Number of Visits 61    Date for PT Re-Evaluation 04/08/21    Authorization Type [employer] (no auth req, no visit limit)    Progress Note Due on Visit 48    PT Start Time 1400    PT Stop Time 1443    PT Time Calculation (min) 43 min    Activity Tolerance Patient tolerated treatment well    Behavior During Therapy [facility] for tasks assessed/performed             Past Medical History:  Diagnosis Date   Allergic rhinitis    Asthmatic bronchitis    Atrial fibrillation ([place])    Celiac disease    Cellulitis    CKD (chronic kidney disease)    Diabetes ([place])    Diverticulosis    Fx. left wrist 1987   Gastric polyps    GERD (gastroesophageal reflux disease)    History of pneumonia    Hypertension    Hypothyroidism    Iron deficiency anemia    Melanoma ([place])    Neuropathy    PAF (paroxysmal atrial fibrillation) ([place])    Prostate cancer ([place])    REM sleep behavior disorder    RLS (restless legs syndrome)    Sleep apnea    Spasticity    Stroke ([place]) 09/2017   Tremor    Type 2 diabetes mellitus ([place])     Past Surgical History:  Procedure Laterality Date   APPENDECTOMY  1978   CARPAL TUNNEL RELEASE Left    CATARACT EXTRACTION  03/2011   bilateral    CHOLECYSTECTOMY  2001   COLONOSCOPY W/ BIOPSIES  04/27/2008   diverticulosis, duodenitis   FOOT SURGERY     HERNIA REPAIR  1994   bilateral   KNEE SURGERY Bilateral    x 2   LUNG SURGERY  2001   for infection   PROSTATECTOMY  2002   UPPER GASTROINTESTINAL ENDOSCOPY  04/27/2008   celiac disease, gastric polyps    There were no vitals filed for  this visit.    Subjective Assessment - 04/05/21 1448     Subjective Feeling good, able to put on juxtafit without difficulty.    Currently in Pain? No/denies                       Wound Therapy - 04/05/21 0001     Subjective Feeling good, able to put on juxtafit without difficulty.    Patient and Family Stated Goals wound to heal    Date of Onset 09/06/20    Prior Treatments MD and self care    Pain Scale 0-10    Pain Score 0-No pain    Evaluation and Treatment Procedures Explained to Patient/Family Yes    Evaluation and Treatment Procedures agreed to    Wound Properties Date First Assessed: 12/04/20 Time First Assessed: 2637 Wound Type: Venous stasis ulcer Location: Leg Location Orientation: Right Present on Admission: Yes   Wound Image Images linked: 1    Dressing Type Gauze (Comment)   Santyl wiht hydrogel on 2x2, gauze, pt donned juxtafit  Dressing Changed Changed    Dressing Status Old drainage    Dressing Change Frequency PRN    Site / Wound Assessment Granulation tissue    % Wound base Red or Granulating 90%    % Wound base Yellow/Fibrinous Exudate 10%    Peri-wound Assessment Erythema (blanchable)    Wound Length (cm) 2.9 cm    Wound Width (cm) 2.3 cm    Wound Depth (cm) 0.2 cm    Wound Volume (cm^3) 1.33 cm^3    Wound Surface Area (cm^2) 6.67 cm^2    Margins Epibole (rolled edges)   epibole 12-5:00   Drainage Amount Minimal    Drainage Description Serosanguineous    Treatment Cleansed;Debridement (Selective)    Selective Debridement - Location wound bed    Selective Debridement - Tools Used Forceps    Selective Debridement - Tissue Removed slough    Wound Therapy - Clinical Statement Pt continues to have redness/rash on skin wiht minimal changes.  Noted small dark spot superior to wound (included in photo in media).  Slough easily removed from wound bed with scalpel.  Address epibole edges from 12-5:00 to promote approximation.  Continued wiht Sanlyl  and hydrogel to address dryness wound bed and light application of gauze to hold in place.  Pt able to don juxtafit without any assistance required.  Reviewed proper wear time wiht juxtafit and to remove at night and reapply in morning to pt and wife- verbailize understanding.    Wound Therapy - Functional Problem List gait and balance    Factors Delaying/Impairing Wound Healing Diabetes Mellitus;Vascular compromise;Altered sensation    Wound Therapy - Frequency 3X / week    Wound Plan Continue wiht Santyl and hydrogel in wound bed.  Continue wiht Juxtafit.  Review use of pump wiht pt next session.    Dressing  Santyl, hydrogel, 2x2,  vaseline, xerform to rash, juxtafit with liner                       PT Short Term Goals - 02/25/21 1502       PT SHORT TERM GOAL #1   Title Patient will have at least 50% granualtion tissue.    Time 3    Period Weeks    Status Achieved    Target Date 12/25/20               PT Long Term Goals - 01/03/21 1344       PT LONG TERM GOAL #1   Title Patient's wound will be healed to reduce the risk of infection.    Time 6    Period Weeks    Status On-going      PT LONG TERM GOAL #2   Title Patient will be able to verbalize completion of daily skin checks to reduce the risk of further wound development.    Time 6    Period Weeks    Status On-going                    Patient will benefit from skilled therapeutic intervention in order to improve the following deficits and impairments:     Visit Diagnosis: Ulcer of right pretibial region, limited to breakdown of skin ([facility])  Other abnormalities of gait and mobility     Problem List Patient Active Problem List   Diagnosis Date Noted   Advanced nonexudative age-related macular degeneration of right eye without subfoveal involvement 11/15/2020   Advanced nonexudative age-related macular degeneration  of left eye without subfoveal involvement 11/15/2020   Diabetes  mellitus without complication ([place]) 67/54/4920   Posterior vitreous detachment of both eyes 11/15/2020   Gait disturbance, post-stroke 11/02/2017   Aphasia as late effect of cerebrovascular accident 11/02/2017   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   Aphasia    PAF (paroxysmal atrial fibrillation) ([place])    Diabetes mellitus type 2 in obese ([place])    Benign essential HTN    Hypothyroidism    Hyperlipidemia    Stroke (cerebrum) ([place]) 10/02/2017   Afib ([place]) 01/10/2017   SOB (shortness of breath) 07/19/1218   Acute diastolic CHF (congestive heart failure) ([place]) 01/10/2017   Atrial fibrillation with RVR ([place]) 01/10/2017   Claudication ([place]) 07/25/2016   Paroxysmal atrial fibrillation ([place]) 07/25/2016   Odynophagia 04/10/2014   Allergic rhinitis 03/23/2014   Chronic rhinitis 12/22/2013   Intrinsic asthma 07/15/2013   Constipation 08/22/2011   Obesity 09/20/2009   G E R D 11/29/2007   Celiac disease 11/29/2007   Ihor Austin, LPTA/CLT; CBIS [phone]   Aldona Lento 04/05/2021, 2:57 PM  [place], [place], [address] Phone: [phone]   Fax:  [phone]  Name: [name] MRN: [id number] Date of Birth: October 21, 1928

## 2021-04-09 ENCOUNTER — Encounter (HOSPITAL_COMMUNITY): Payer: Self-pay | Admitting: Physical Therapy

## 2021-04-09 ENCOUNTER — Other Ambulatory Visit: Payer: Self-pay

## 2021-04-09 ENCOUNTER — Ambulatory Visit (HOSPITAL_COMMUNITY): Payer: PPO | Admitting: Physical Therapy

## 2021-04-09 DIAGNOSIS — R2689 Other abnormalities of gait and mobility: Secondary | ICD-10-CM

## 2021-04-09 DIAGNOSIS — L97811 Non-pressure chronic ulcer of other part of right lower leg limited to breakdown of skin: Secondary | ICD-10-CM

## 2021-04-09 NOTE — Therapy (Signed)
[place], [place], [address] Phone: [phone]   Fax:  [phone]  Wound Care Therapy  Patient Details  Name: [name] MRN: [id number] Date of Birth: [birth year] Referring Provider (PT): [name]   Encounter Date: 04/09/2021   PT End of Session - 04/09/21 1357     Visit Number 47    Number of Visits 56    Date for PT Re-Evaluation 05/21/21    Authorization Type [employer] (no auth req, no visit limit)    Progress Note Due on Visit 71    PT Start Time 1315    PT Stop Time 1355    PT Time Calculation (min) 40 min    Activity Tolerance Patient tolerated treatment well    Behavior During Therapy [facility] for tasks assessed/performed             Past Medical History:  Diagnosis Date   Allergic rhinitis    Asthmatic bronchitis    Atrial fibrillation ([place])    Celiac disease    Cellulitis    CKD (chronic kidney disease)    Diabetes ([place])    Diverticulosis    Fx. left wrist 1987   Gastric polyps    GERD (gastroesophageal reflux disease)    History of pneumonia    Hypertension    Hypothyroidism    Iron deficiency anemia    Melanoma ([place])    Neuropathy    PAF (paroxysmal atrial fibrillation) (HCC)    Prostate cancer ([place])    REM sleep behavior disorder    RLS (restless legs syndrome)    Sleep apnea    Spasticity    Stroke ([place]) 09/2017   Tremor    Type 2 diabetes mellitus ([place])     Past Surgical History:  Procedure Laterality Date   APPENDECTOMY  1978   CARPAL TUNNEL RELEASE Left    CATARACT EXTRACTION  03/2011   bilateral    CHOLECYSTECTOMY  2001   COLONOSCOPY W/ BIOPSIES  04/27/2008   diverticulosis, duodenitis   FOOT SURGERY     HERNIA REPAIR  1994   bilateral   KNEE SURGERY Bilateral    x 2   LUNG SURGERY  2001   for infection   PROSTATECTOMY  2002   UPPER GASTROINTESTINAL ENDOSCOPY  04/27/2008   celiac disease, gastric polyps    There were no vitals filed for  this visit.      [facility] PT Assessment - 04/09/21 0001       Assessment   Medical Diagnosis Chronic venous stasis wound    Referring Provider (PT) [name]                     Wound Therapy - 04/09/21 0001     Subjective Feeling good, able to put on juxtafit without difficulty.    Patient and Family Stated Goals wound to heal    Date of Onset 09/06/20    Prior Treatments MD and self care    Pain Scale 0-10    Pain Score 0-No pain    Evaluation and Treatment Procedures Explained to Patient/Family Yes    Evaluation and Treatment Procedures agreed to    Wound Properties Date First Assessed: 12/04/20 Time First Assessed: 1062 Wound Type: Venous stasis ulcer Location: Leg Location Orientation: Right Present on Admission: Yes   Wound Image Images linked: 1    Dressing Type Gauze (Comment)   santyl and hydrogel   Dressing Changed  Changed    Dressing Status Old drainage    Dressing Change Frequency PRN    Site / Wound Assessment Granulation tissue    % Wound base Red or Granulating 90%    % Wound base Yellow/Fibrinous Exudate 10%    Peri-wound Assessment Edema;Erythema (blanchable)   rash/hives   Wound Length (cm) 2.8 cm   initial 3.5   Wound Width (cm) 2.3 cm   initial 2.8   Wound Depth (cm) 0.2 cm   initial .3   Wound Volume (cm^3) 1.29 cm^3    Wound Surface Area (cm^2) 6.44 cm^2    Margins Unattached edges (unapproximated)    Drainage Amount Minimal    Drainage Description Serosanguineous    Treatment Debridement (Selective);Cleansed    Selective Debridement - Location wound bed    Selective Debridement - Tools Used Forceps    Selective Debridement - Tissue Removed slough    Wound Therapy - Clinical Statement Wound continues to approximate and fill in. Rash still present surrounding right lower leg and presents almost as hives. Red spot superior to wound on lateral side of leg that looks like blood blister started to form from possibly hitting his leg on something  though patient does not recall doing this. Took picture to monitor. Observed donning juxtafit and it does not seem to pus excessive pressure on the spot. Overall wound is healing slow but short term goal met at this time and overall volume has reduced from 2.94 to 1.29 cm^3. Extending POC additional 6 weeks to continue to work on promoting wound healing and reducing rick of infection.    Wound Therapy - Functional Problem List gait and balance    Factors Delaying/Impairing Wound Healing Diabetes Mellitus;Vascular compromise;Altered sensation    Wound Therapy - Frequency 3X / week    Wound Plan Inspect spot on right lower leg. Continue with Santyl and hydrogel in wound bed.  Continue wiht Juxtafit.  Review use of pump wiht pt next session.    Dressing  Santyl, hydrogel, 2x2,  vaseline, xerform to rash, juxtafit with liner                       PT Short Term Goals - 02/25/21 1502       PT SHORT TERM GOAL #1   Title Patient will have at least 50% granualtion tissue.    Time 3    Period Weeks    Status Achieved    Target Date 12/25/20               PT Long Term Goals - 01/03/21 1344       PT LONG TERM GOAL #1   Title Patient's wound will be healed to reduce the risk of infection.    Time 6    Period Weeks    Status On-going      PT LONG TERM GOAL #2   Title Patient will be able to verbalize completion of daily skin checks to reduce the risk of further wound development.    Time 6    Period Weeks    Status On-going                    Patient will benefit from skilled therapeutic intervention in order to improve the following deficits and impairments:     Visit Diagnosis: Ulcer of right pretibial region, limited to breakdown of skin (HCC)  Other abnormalities of gait and mobility     Problem List  Patient Active Problem List   Diagnosis Date Noted   Advanced nonexudative age-related macular degeneration of right eye without subfoveal  involvement 11/15/2020   Advanced nonexudative age-related macular degeneration of left eye without subfoveal involvement 11/15/2020   Diabetes mellitus without complication ([place]) 60/73/7106   Posterior vitreous detachment of both eyes 11/15/2020   Gait disturbance, post-stroke 11/02/2017   Aphasia as late effect of cerebrovascular accident 11/02/2017   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   Aphasia    PAF (paroxysmal atrial fibrillation) ([place])    Diabetes mellitus type 2 in obese ([place])    Benign essential HTN    Hypothyroidism    Hyperlipidemia    Stroke (cerebrum) ([place]) 10/02/2017   Afib ([place]) 01/10/2017   SOB (shortness of breath) 26/94/8546   Acute diastolic CHF (congestive heart failure) ([place]) 01/10/2017   Atrial fibrillation with RVR ([place]) 01/10/2017   Claudication ([place]) 07/25/2016   Paroxysmal atrial fibrillation ([place]) 07/25/2016   Odynophagia 04/10/2014   Allergic rhinitis 03/23/2014   Chronic rhinitis 12/22/2013   Intrinsic asthma 07/15/2013   Constipation 08/22/2011   Obesity 09/20/2009   G E R D 11/29/2007   Celiac disease 11/29/2007   2:14 PM, 04/09/21 Jerene Pitch, DPT Physical Therapy with [facility]  [phone] office   [place] [address] [place], [place], [address] Phone: [phone]   Fax:  [phone]  Name: [name] MRN: [id number] Date of Birth: September 18, 1929

## 2021-04-10 ENCOUNTER — Ambulatory Visit (HOSPITAL_COMMUNITY): Payer: PPO | Admitting: Physical Therapy

## 2021-04-11 DIAGNOSIS — E1165 Type 2 diabetes mellitus with hyperglycemia: Secondary | ICD-10-CM | POA: Diagnosis not present

## 2021-04-11 DIAGNOSIS — I10 Essential (primary) hypertension: Secondary | ICD-10-CM | POA: Diagnosis not present

## 2021-04-11 DIAGNOSIS — E7800 Pure hypercholesterolemia, unspecified: Secondary | ICD-10-CM | POA: Diagnosis not present

## 2021-04-12 ENCOUNTER — Other Ambulatory Visit: Payer: Self-pay

## 2021-04-12 ENCOUNTER — Encounter (HOSPITAL_COMMUNITY): Payer: Self-pay | Admitting: Physical Therapy

## 2021-04-12 ENCOUNTER — Ambulatory Visit (HOSPITAL_COMMUNITY): Payer: PPO | Attending: Internal Medicine | Admitting: Physical Therapy

## 2021-04-12 DIAGNOSIS — R2689 Other abnormalities of gait and mobility: Secondary | ICD-10-CM | POA: Insufficient documentation

## 2021-04-12 DIAGNOSIS — L97811 Non-pressure chronic ulcer of other part of right lower leg limited to breakdown of skin: Secondary | ICD-10-CM | POA: Diagnosis not present

## 2021-04-12 DIAGNOSIS — E119 Type 2 diabetes mellitus without complications: Secondary | ICD-10-CM | POA: Diagnosis not present

## 2021-04-12 NOTE — Therapy (Signed)
[place], [place], [address] Phone: [phone]   Fax:  [phone]  Wound Care Therapy  Patient Details  Name: [name] MRN: [id number] Date of Birth: 1929/07/17 Referring Provider (PT): [name]   Encounter Date: 04/12/2021   PT End of Session - 04/12/21 1212     Visit Number 31    Number of Visits 89    Date for PT Re-Evaluation 05/21/21    Authorization Type [employer] (no auth req, no visit limit)    Progress Note Due on Visit 18    PT Start Time 1135    PT Stop Time 1210    PT Time Calculation (min) 35 min    Activity Tolerance Patient tolerated treatment well    Behavior During Therapy [facility] for tasks assessed/performed             Past Medical History:  Diagnosis Date   Allergic rhinitis    Asthmatic bronchitis    Atrial fibrillation ([place])    Celiac disease    Cellulitis    CKD (chronic kidney disease)    Diabetes ([place])    Diverticulosis    Fx. left wrist 1987   Gastric polyps    GERD (gastroesophageal reflux disease)    History of pneumonia    Hypertension    Hypothyroidism    Iron deficiency anemia    Melanoma ([place])    Neuropathy    PAF (paroxysmal atrial fibrillation) ([place])    Prostate cancer ([place])    REM sleep behavior disorder    RLS (restless legs syndrome)    Sleep apnea    Spasticity    Stroke ([place]) 09/2017   Tremor    Type 2 diabetes mellitus ([place])     Past Surgical History:  Procedure Laterality Date   APPENDECTOMY  1978   CARPAL TUNNEL RELEASE Left    CATARACT EXTRACTION  03/2011   bilateral    CHOLECYSTECTOMY  2001   COLONOSCOPY W/ BIOPSIES  04/27/2008   diverticulosis, duodenitis   FOOT SURGERY     HERNIA REPAIR  1994   bilateral   KNEE SURGERY Bilateral    x 2   LUNG SURGERY  2001   for infection   PROSTATECTOMY  2002   UPPER GASTROINTESTINAL ENDOSCOPY  04/27/2008   celiac disease, gastric polyps    There were no vitals filed for this  visit.      [facility] PT Assessment - 04/12/21 1213       Assessment   Medical Diagnosis Chronic venous stasis wound    Referring Provider (PT) [name]                     Wound Therapy - 04/12/21 0001     Subjective Reports he feels aright, no complaints and no pain    Patient and Family Stated Goals wound to heal    Date of Onset 09/06/20    Prior Treatments MD and self care    Pain Scale 0-10    Pain Score 0-No pain    Evaluation and Treatment Procedures Explained to Patient/Family Yes    Evaluation and Treatment Procedures agreed to    Wound Properties Date First Assessed: 12/04/20 Time First Assessed: 5284 Wound Type: Venous stasis ulcer Location: Leg Location Orientation: Right Present on Admission: Yes   Wound Image Images linked: 1    Dressing Type Gauze (Comment)   santyl   Dressing Changed Changed  Dressing Status Old drainage    Dressing Change Frequency PRN    Site / Wound Assessment Granulation tissue    % Wound base Red or Granulating 95%    % Wound base Yellow/Fibrinous Exudate 5%    Peri-wound Assessment Edema;Erythema (blanchable)   hives/rash   Margins Unattached edges (unapproximated)    Drainage Amount Minimal    Drainage Description Serosanguineous    Treatment Cleansed;Debridement (Selective)    Selective Debridement - Location wound bed    Selective Debridement - Tools Used Forceps    Selective Debridement - Tissue Removed slough    Wound Therapy - Clinical Statement Rash continues on lower leg though it does not look as bad. Focused on appllying vaseoline to entire right leg and washing off as much dead skin as possible. Skin over blood blisters was broken and skin easil removed with washing of the leg. Transitioned to xeroform to trial if he tolerates this better in regards to the hives/rash. No santyl used today. Also applied xeroform to blood blister area. Took pictures of both areas for record. Continued with juxtafit after guaze and  xeroform. Transitioned paitent to 2x/week secondary to improved granulation.    Wound Therapy - Functional Problem List gait and balance    Factors Delaying/Impairing Wound Healing Diabetes Mellitus;Vascular compromise;Altered sensation    Wound Therapy - Frequency 2X / week    Wound Plan Inspect spot on right lower leg. Continue with Santyl and hydrogel in wound bed.  Continue wiht Juxtafit.    Dressing  xerform, hydrogel, vaseline, 4x4,  xerform to ras and blister, juxtafit with liner                       PT Short Term Goals - 02/25/21 1502       PT SHORT TERM GOAL #1   Title Patient will have at least 50% granualtion tissue.    Time 3    Period Weeks    Status Achieved    Target Date 12/25/20               PT Long Term Goals - 01/03/21 1344       PT LONG TERM GOAL #1   Title Patient's wound will be healed to reduce the risk of infection.    Time 6    Period Weeks    Status On-going      PT LONG TERM GOAL #2   Title Patient will be able to verbalize completion of daily skin checks to reduce the risk of further wound development.    Time 6    Period Weeks    Status On-going                    Patient will benefit from skilled therapeutic intervention in order to improve the following deficits and impairments:     Visit Diagnosis: Ulcer of right pretibial region, limited to breakdown of skin ([place])  Other abnormalities of gait and mobility     Problem List Patient Active Problem List   Diagnosis Date Noted   Advanced nonexudative age-related macular degeneration of right eye without subfoveal involvement 11/15/2020   Advanced nonexudative age-related macular degeneration of left eye without subfoveal involvement 11/15/2020   Diabetes mellitus without complication ([place]) 37/16/9678   Posterior vitreous detachment of both eyes 11/15/2020   Gait disturbance, post-stroke 11/02/2017   Aphasia as late effect of cerebrovascular accident  11/02/2017   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) 10/02/2017   Afib ([place]) 01/10/2017   SOB (shortness of breath) 50/12/7046   Acute diastolic CHF (congestive heart failure) ([place]) 01/10/2017   Atrial fibrillation with RVR ([place]) 01/10/2017   Claudication ([place]) 07/25/2016   Paroxysmal atrial fibrillation ([place]) 07/25/2016   Odynophagia 04/10/2014   Allergic rhinitis 03/23/2014   Chronic rhinitis 12/22/2013   Intrinsic asthma 07/15/2013   Constipation 08/22/2011   Obesity 09/20/2009   G E R D 11/29/2007   Celiac disease 11/29/2007   12:22 PM, 04/12/21 Jerene Pitch, DPT Physical Therapy with [facility]  [phone] office   [place], [place], [address] Phone: [phone]   Fax:  [phone]  Name: [name] MRN: [id number] Date of Birth: [birth year]

## 2021-04-16 ENCOUNTER — Other Ambulatory Visit: Payer: Self-pay

## 2021-04-16 ENCOUNTER — Ambulatory Visit (HOSPITAL_COMMUNITY): Payer: PPO | Admitting: Physical Therapy

## 2021-04-16 DIAGNOSIS — R600 Localized edema: Secondary | ICD-10-CM | POA: Diagnosis not present

## 2021-04-16 DIAGNOSIS — R2689 Other abnormalities of gait and mobility: Secondary | ICD-10-CM

## 2021-04-16 DIAGNOSIS — N189 Chronic kidney disease, unspecified: Secondary | ICD-10-CM | POA: Diagnosis not present

## 2021-04-16 DIAGNOSIS — L97811 Non-pressure chronic ulcer of other part of right lower leg limited to breakdown of skin: Secondary | ICD-10-CM | POA: Diagnosis not present

## 2021-04-16 DIAGNOSIS — E1122 Type 2 diabetes mellitus with diabetic chronic kidney disease: Secondary | ICD-10-CM | POA: Diagnosis not present

## 2021-04-16 DIAGNOSIS — E1129 Type 2 diabetes mellitus with other diabetic kidney complication: Secondary | ICD-10-CM | POA: Diagnosis not present

## 2021-04-16 DIAGNOSIS — D638 Anemia in other chronic diseases classified elsewhere: Secondary | ICD-10-CM | POA: Diagnosis not present

## 2021-04-16 DIAGNOSIS — R809 Proteinuria, unspecified: Secondary | ICD-10-CM | POA: Diagnosis not present

## 2021-04-16 DIAGNOSIS — E211 Secondary hyperparathyroidism, not elsewhere classified: Secondary | ICD-10-CM | POA: Diagnosis not present

## 2021-04-16 DIAGNOSIS — I10 Essential (primary) hypertension: Secondary | ICD-10-CM | POA: Diagnosis not present

## 2021-04-16 NOTE — Therapy (Signed)
[place], [place], [address] Phone: [phone]   Fax:  [phone]  Wound Care Therapy  Patient Details  Name: [name] MRN: [id number] Date of Birth: [birth year] Referring Provider (PT): [name]   Encounter Date: 04/16/2021   PT End of Session - 04/16/21 1520     Visit Number 70    Number of Visits 18    Date for PT Re-Evaluation 05/21/21    Authorization Type [employer] (no auth req, no visit limit)    Progress Note Due on Visit 75    PT Start Time 1448    PT Stop Time 1520    PT Time Calculation (min) 32 min    Activity Tolerance Patient tolerated treatment well    Behavior During Therapy [facility] for tasks assessed/performed             Past Medical History:  Diagnosis Date   Allergic rhinitis    Asthmatic bronchitis    Atrial fibrillation (HCC)    Celiac disease    Cellulitis    CKD (chronic kidney disease)    Diabetes ([place])    Diverticulosis    Fx. left wrist 1987   Gastric polyps    GERD (gastroesophageal reflux disease)    History of pneumonia    Hypertension    Hypothyroidism    Iron deficiency anemia    Melanoma ([place])    Neuropathy    PAF (paroxysmal atrial fibrillation) (HCC)    Prostate cancer ([place])    REM sleep behavior disorder    RLS (restless legs syndrome)    Sleep apnea    Spasticity    Stroke ([place]) 09/2017   Tremor    Type 2 diabetes mellitus ([place])     Past Surgical History:  Procedure Laterality Date   APPENDECTOMY  1978   CARPAL TUNNEL RELEASE Left    CATARACT EXTRACTION  03/2011   bilateral    CHOLECYSTECTOMY  2001   COLONOSCOPY W/ BIOPSIES  04/27/2008   diverticulosis, duodenitis   FOOT SURGERY     HERNIA REPAIR  1994   bilateral   KNEE SURGERY Bilateral    x 2   LUNG SURGERY  2001   for infection   PROSTATECTOMY  2002   UPPER GASTROINTESTINAL ENDOSCOPY  04/27/2008   celiac disease, gastric polyps    There were no vitals filed for this  visit.      [facility] PT Assessment - 04/16/21 0001       Assessment   Medical Diagnosis Chronic venous stasis wound    Referring Provider (PT) [name]                     Wound Therapy - 04/16/21 0001     Subjective Reports minimal pain, 2/10 pain. OVerall comfort with dressing    Patient and Family Stated Goals wound to heal    Date of Onset 09/06/20    Prior Treatments MD and self care    Pain Scale 0-10    Pain Score 2     Faces Pain Scale Hurts a little bit    Evaluation and Treatment Procedures Explained to Patient/Family Yes    Evaluation and Treatment Procedures agreed to    Wound Properties Date First Assessed: 12/04/20 Time First Assessed: 7371 Wound Type: Venous stasis ulcer Location: Leg Location Orientation: Right Present on Admission: Yes   Wound Image Images linked: 1    Dressing Type Impregnated  gauze (bismuth)    Dressing Changed Changed    Dressing Status Old drainage    Dressing Change Frequency PRN    Site / Wound Assessment Granulation tissue;Pale;Painful    % Wound base Red or Granulating 95%    % Wound base Yellow/Fibrinous Exudate 5%    Peri-wound Assessment Edema;Erythema (blanchable)   rash   Wound Length (cm) 2.4 cm   was 2.8   Wound Width (cm) 2 cm   was 2.3   Wound Depth (cm) 0.2 cm   was .2   Wound Volume (cm^3) 0.96 cm^3    Wound Surface Area (cm^2) 4.8 cm^2    Margins Unattached edges (unapproximated)    Drainage Amount Minimal    Drainage Description Serosanguineous    Treatment Cleansed;Debridement (Selective)    Selective Debridement - Location wound bed    Selective Debridement - Tools Used Forceps    Selective Debridement - Tissue Removed slough    Wound Therapy - Clinical Statement Wound continues to approximate. Was initially covered in 100% slough but this was easily removed with washcloth. Hives along foot look much improved though patient continues to have rash on lower leg. Seems to tolerate xeroform better then Santyl,  will continue with xeroform going forward but will transition to Santyl if slough is not easily removed between session. Contusion washed with gauze and xeroform applied. Will continue to take pictures/ monitor. Will get order for debridement of this spot too incase this does not improve. Will continue with current POC.    Wound Therapy - Functional Problem List gait and balance    Factors Delaying/Impairing Wound Healing Diabetes Mellitus;Vascular compromise;Altered sensation    Wound Therapy - Frequency 2X / week    Wound Plan xeroform in wound bed.  Continue wiht Juxtafit.    Dressing  xerform, hydrogel, vaseline, 4x4,  xerform to rash and blister, juxtafit with liner                       PT Short Term Goals - 02/25/21 1502       PT SHORT TERM GOAL #1   Title Patient will have at least 50% granualtion tissue.    Time 3    Period Weeks    Status Achieved    Target Date 12/25/20               PT Long Term Goals - 01/03/21 1344       PT LONG TERM GOAL #1   Title Patient's wound will be healed to reduce the risk of infection.    Time 6    Period Weeks    Status On-going      PT LONG TERM GOAL #2   Title Patient will be able to verbalize completion of daily skin checks to reduce the risk of further wound development.    Time 6    Period Weeks    Status On-going                   Plan - 04/16/21 1525     Clinical Impression Statement see above    Personal Factors and Comorbidities Age;Comorbidity 3+;Past/Current Experience;Fitness;Time since onset of injury/illness/exacerbation    Comorbidities DM, CKD, neuorpathy, Hx CVA    Examination-Activity Limitations Locomotion Level;Transfers;Squat;Stand;Dressing    Examination-Participation Restrictions Community Activity    Stability/Clinical Decision Making Stable/Uncomplicated    Rehab Potential Good    PT Frequency 3x / week    PT Duration 6 weeks  increased time secondary to slow healing wound    PT Treatment/Interventions Gait training;Stair training;Functional mobility training;DME Instruction;Therapeutic activities;Therapeutic exercise;Balance training;Neuromuscular re-education;Patient/family education;Compression bandaging;Manual lymph drainage;Manual techniques    PT Next Visit Plan Continue with both chemical and mechanical debridement to remove adherent eschar to allow healing to occure    Consulted and Agree with Plan of Care Patient;Family member/caregiver    Family Member Consulted wife             Patient will benefit from skilled therapeutic intervention in order to improve the following deficits and impairments:  Abnormal gait, Decreased skin integrity, Decreased mobility, Impaired sensation  Visit Diagnosis: Ulcer of right pretibial region, limited to breakdown of skin (HCC)  Other abnormalities of gait and mobility     Problem List Patient Active Problem List   Diagnosis Date Noted   Advanced nonexudative age-related macular degeneration of right eye without subfoveal involvement 11/15/2020   Advanced nonexudative age-related macular degeneration of left eye without subfoveal involvement 11/15/2020   Diabetes mellitus without complication ([place]) 25/36/6440   Posterior vitreous detachment of both eyes 11/15/2020   Gait disturbance, post-stroke 11/02/2017   Aphasia as late effect of cerebrovascular accident 11/02/2017   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   Aphasia    PAF (paroxysmal atrial fibrillation) ([place])    Diabetes mellitus type 2 in obese ([place])    Benign essential HTN    Hypothyroidism    Hyperlipidemia    Stroke (cerebrum) ([place]) 10/02/2017   Afib ([place]) 01/10/2017   SOB (shortness of breath) 34/74/2595   Acute diastolic CHF (congestive heart failure) ([place]) 01/10/2017   Atrial fibrillation with RVR ([place]) 01/10/2017   Claudication ([place]) 07/25/2016   Paroxysmal atrial fibrillation ([place]) 07/25/2016   Odynophagia 04/10/2014    Allergic rhinitis 03/23/2014   Chronic rhinitis 12/22/2013   Intrinsic asthma 07/15/2013   Constipation 08/22/2011   Obesity 09/20/2009   G E R D 11/29/2007   Celiac disease 11/29/2007   3:31 PM, 04/16/21 Jerene Pitch, DPT Physical Therapy with [facility]  [phone] office   [place] [address] [place], [place], [address] Phone: [phone]   Fax:  [phone]  Name: [name] MRN: [id number] Date of Birth: 18-Dec-1928

## 2021-04-17 ENCOUNTER — Ambulatory Visit (HOSPITAL_COMMUNITY): Payer: PPO | Admitting: Physical Therapy

## 2021-04-19 ENCOUNTER — Other Ambulatory Visit: Payer: Self-pay

## 2021-04-19 ENCOUNTER — Ambulatory Visit (HOSPITAL_COMMUNITY): Payer: PPO

## 2021-04-19 ENCOUNTER — Encounter (HOSPITAL_COMMUNITY): Payer: Self-pay

## 2021-04-19 DIAGNOSIS — R809 Proteinuria, unspecified: Secondary | ICD-10-CM | POA: Diagnosis not present

## 2021-04-19 DIAGNOSIS — L97811 Non-pressure chronic ulcer of other part of right lower leg limited to breakdown of skin: Secondary | ICD-10-CM

## 2021-04-19 DIAGNOSIS — R2689 Other abnormalities of gait and mobility: Secondary | ICD-10-CM

## 2021-04-19 DIAGNOSIS — I10 Essential (primary) hypertension: Secondary | ICD-10-CM | POA: Diagnosis not present

## 2021-04-19 DIAGNOSIS — N189 Chronic kidney disease, unspecified: Secondary | ICD-10-CM | POA: Diagnosis not present

## 2021-04-19 DIAGNOSIS — E1122 Type 2 diabetes mellitus with diabetic chronic kidney disease: Secondary | ICD-10-CM | POA: Diagnosis not present

## 2021-04-19 DIAGNOSIS — D638 Anemia in other chronic diseases classified elsewhere: Secondary | ICD-10-CM | POA: Diagnosis not present

## 2021-04-19 DIAGNOSIS — E1129 Type 2 diabetes mellitus with other diabetic kidney complication: Secondary | ICD-10-CM | POA: Diagnosis not present

## 2021-04-19 DIAGNOSIS — E211 Secondary hyperparathyroidism, not elsewhere classified: Secondary | ICD-10-CM | POA: Diagnosis not present

## 2021-04-19 NOTE — Therapy (Signed)
[place], [place], [address] Phone: [phone]   Fax:  [phone]  Wound Care Therapy  Patient Details  Name: [name] MRN: [id number] Date of Birth: [birth year] Referring Provider (PT): [name]   Encounter Date: 04/19/2021   PT End of Session - 04/19/21 1254     Visit Number 50    Number of Visits 70    Date for PT Re-Evaluation 05/21/21    Authorization Type [employer] (no auth req, no visit limit)    Progress Note Due on Visit 66    PT Start Time 1135    PT Stop Time 1212    PT Time Calculation (min) 37 min    Activity Tolerance Patient tolerated treatment well    Behavior During Therapy [facility] for tasks assessed/performed             Past Medical History:  Diagnosis Date   Allergic rhinitis    Asthmatic bronchitis    Atrial fibrillation (HCC)    Celiac disease    Cellulitis    CKD (chronic kidney disease)    Diabetes ([place])    Diverticulosis    Fx. left wrist 1987   Gastric polyps    GERD (gastroesophageal reflux disease)    History of pneumonia    Hypertension    Hypothyroidism    Iron deficiency anemia    Melanoma ([place])    Neuropathy    PAF (paroxysmal atrial fibrillation) ([place])    Prostate cancer ([place])    REM sleep behavior disorder    RLS (restless legs syndrome)    Sleep apnea    Spasticity    Stroke ([place]) 09/2017   Tremor    Type 2 diabetes mellitus ([place])     Past Surgical History:  Procedure Laterality Date   APPENDECTOMY  1978   CARPAL TUNNEL RELEASE Left    CATARACT EXTRACTION  03/2011   bilateral    CHOLECYSTECTOMY  2001   COLONOSCOPY W/ BIOPSIES  04/27/2008   diverticulosis, duodenitis   FOOT SURGERY     HERNIA REPAIR  1994   bilateral   KNEE SURGERY Bilateral    x 2   LUNG SURGERY  2001   for infection   PROSTATECTOMY  2002   UPPER GASTROINTESTINAL ENDOSCOPY  04/27/2008   celiac disease, gastric polyps    There were no vitals filed for this  visit.    Subjective Assessment - 04/19/21 1223     Subjective Feeling good, no reports of pain.    Currently in Pain? No/denies                       Wound Therapy - 04/19/21 0001     Subjective Feeling good, no reports of pain.    Patient and Family Stated Goals wound to heal    Date of Onset 09/06/20    Prior Treatments MD and self care    Pain Scale 0-10    Evaluation and Treatment Procedures Explained to Patient/Family Yes    Evaluation and Treatment Procedures agreed to    Wound Properties Date First Assessed: 12/04/20 Time First Assessed: 8341 Wound Type: Venous stasis ulcer Location: Leg Location Orientation: Right Present on Admission: Yes   Wound Image Images linked: 1    Dressing Type Impregnated gauze (bismuth);Gauze (Comment)    Dressing Changed Changed    Dressing Status Old drainage    Dressing Change Frequency PRN  Site / Wound Assessment Granulation tissue    % Wound base Red or Granulating 95%    % Wound base Yellow/Fibrinous Exudate 5%    Peri-wound Assessment Edema;Erythema (blanchable)   rash   Wound Length (cm) 2.4 cm    Wound Width (cm) 2.2 cm    Wound Depth (cm) 0.2 cm    Wound Volume (cm^3) 1.06 cm^3    Wound Surface Area (cm^2) 5.28 cm^2    Margins Unattached edges (unapproximated)    Drainage Amount Minimal    Drainage Description Serosanguineous    Treatment Cleansed;Debridement (Selective)    Selective Debridement - Location wound bed    Selective Debridement - Tools Used Scalpel    Selective Debridement - Tissue Removed slough    Wound Therapy - Clinical Statement Slough easily removed from wound bed.  Received order for second wound, no selective debridement necessary for superior wound today.  Did cleanse LE well, removed excessive dry skin on Rt LE, vaseline/lotion applied to whole leg to address dry skin.  Continued wiht xeroform over primary wound and over rash anterior lower leg.  No reports of increased pain through session.     Wound Therapy - Functional Problem List gait and balance    Factors Delaying/Impairing Wound Healing Diabetes Mellitus;Vascular compromise;Altered sensation    Wound Therapy - Frequency 2X / week    Wound Plan xeroform in wound bed.  Continue wiht Juxtafit.    Dressing  xerform, hydrogel, vaseline, 4x4,  xerform to rash and blister, juxtafit with liner                       PT Short Term Goals - 02/25/21 1502       PT SHORT TERM GOAL #1   Title Patient will have at least 50% granualtion tissue.    Time 3    Period Weeks    Status Achieved    Target Date 12/25/20               PT Long Term Goals - 01/03/21 1344       PT LONG TERM GOAL #1   Title Patient's wound will be healed to reduce the risk of infection.    Time 6    Period Weeks    Status On-going      PT LONG TERM GOAL #2   Title Patient will be able to verbalize completion of daily skin checks to reduce the risk of further wound development.    Time 6    Period Weeks    Status On-going                    Patient will benefit from skilled therapeutic intervention in order to improve the following deficits and impairments:     Visit Diagnosis: Ulcer of right pretibial region, limited to breakdown of skin ([place])  Other abnormalities of gait and mobility     Problem List Patient Active Problem List   Diagnosis Date Noted   Advanced nonexudative age-related macular degeneration of right eye without subfoveal involvement 11/15/2020   Advanced nonexudative age-related macular degeneration of left eye without subfoveal involvement 11/15/2020   Diabetes mellitus without complication ([place]) 04/05/7627   Posterior vitreous detachment of both eyes 11/15/2020   Gait disturbance, post-stroke 11/02/2017   Aphasia as late effect of cerebrovascular accident 11/02/2017   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   Aphasia    PAF (paroxysmal atrial fibrillation) ([place])  Diabetes  mellitus type 2 in obese (HCC)    Benign essential HTN    Hypothyroidism    Hyperlipidemia    Stroke (cerebrum) ([place]) 10/02/2017   Afib ([place]) 01/10/2017   SOB (shortness of breath) 58/72/7618   Acute diastolic CHF (congestive heart failure) ([place]) 01/10/2017   Atrial fibrillation with RVR ([place]) 01/10/2017   Claudication ([place]) 07/25/2016   Paroxysmal atrial fibrillation ([place]) 07/25/2016   Odynophagia 04/10/2014   Allergic rhinitis 03/23/2014   Chronic rhinitis 12/22/2013   Intrinsic asthma 07/15/2013   Constipation 08/22/2011   Obesity 09/20/2009   G E R D 11/29/2007   Celiac disease 11/29/2007   Ihor Austin, LPTA/CLT; CBIS [phone]  Aldona Lento 04/19/2021, 12:58 PM  [place], [place], [address] Phone: [phone]   Fax:  [phone]  Name: AUTUMN GUNN MRN: [id number] Date of Birth: 02-11-1929

## 2021-04-22 ENCOUNTER — Ambulatory Visit (HOSPITAL_COMMUNITY): Payer: PPO | Admitting: Physical Therapy

## 2021-04-23 ENCOUNTER — Encounter (HOSPITAL_COMMUNITY): Payer: Self-pay

## 2021-04-23 ENCOUNTER — Other Ambulatory Visit: Payer: Self-pay

## 2021-04-23 ENCOUNTER — Ambulatory Visit (HOSPITAL_COMMUNITY): Payer: PPO

## 2021-04-23 DIAGNOSIS — R2689 Other abnormalities of gait and mobility: Secondary | ICD-10-CM

## 2021-04-23 DIAGNOSIS — L97811 Non-pressure chronic ulcer of other part of right lower leg limited to breakdown of skin: Secondary | ICD-10-CM | POA: Diagnosis not present

## 2021-04-23 NOTE — Therapy (Signed)
[place], [place], [address] Phone: [phone]   Fax:  [phone]  Wound Care Therapy  Patient Details  Name: [name] MRN: [id number] Date of Birth: [birth year] Referring Provider (PT): [name]   Encounter Date: 04/23/2021   PT End of Session - 04/23/21 1612     Visit Number 83    Number of Visits 29    Date for PT Re-Evaluation 05/21/21    Authorization Type [employer] (no auth req, no visit limit)    Progress Note Due on Visit 30    PT Start Time 1533    PT Stop Time 1605    PT Time Calculation (min) 32 min    Activity Tolerance Patient tolerated treatment well    Behavior During Therapy [facility] for tasks assessed/performed             Past Medical History:  Diagnosis Date   Allergic rhinitis    Asthmatic bronchitis    Atrial fibrillation ([place])    Celiac disease    Cellulitis    CKD (chronic kidney disease)    Diabetes ([place])    Diverticulosis    Fx. left wrist 1987   Gastric polyps    GERD (gastroesophageal reflux disease)    History of pneumonia    Hypertension    Hypothyroidism    Iron deficiency anemia    Melanoma ([place])    Neuropathy    PAF (paroxysmal atrial fibrillation) ([place])    Prostate cancer ([place])    REM sleep behavior disorder    RLS (restless legs syndrome)    Sleep apnea    Spasticity    Stroke ([place]) 09/2017   Tremor    Type 2 diabetes mellitus ([place])     Past Surgical History:  Procedure Laterality Date   APPENDECTOMY  1978   CARPAL TUNNEL RELEASE Left    CATARACT EXTRACTION  03/2011   bilateral    CHOLECYSTECTOMY  2001   COLONOSCOPY W/ BIOPSIES  04/27/2008   diverticulosis, duodenitis   FOOT SURGERY     HERNIA REPAIR  1994   bilateral   KNEE SURGERY Bilateral    x 2   LUNG SURGERY  2001   for infection   PROSTATECTOMY  2002   UPPER GASTROINTESTINAL ENDOSCOPY  04/27/2008   celiac disease, gastric polyps    There were no vitals filed for  this visit.    Subjective Assessment - 04/23/21 1611     Subjective Feeling good, dressings intact no reports of pain today.    Currently in Pain? No/denies                       Wound Therapy - 04/23/21 0001     Subjective Feeling good, dressings intact no reports of pain today.    Patient and Family Stated Goals wound to heal    Date of Onset 09/06/20    Prior Treatments MD and self care    Pain Scale 0-10    Pain Score 0-No pain    Evaluation and Treatment Procedures Explained to Patient/Family Yes    Evaluation and Treatment Procedures agreed to    Wound Properties Date First Assessed: 12/04/20 Time First Assessed: 4034 Wound Type: Venous stasis ulcer Location: Leg Location Orientation: Right Present on Admission: Yes   Wound Image Images linked: 1    Dressing Type Impregnated gauze (bismuth);Gauze (Comment)    Dressing Changed Changed    Dressing  Status Old drainage    Dressing Change Frequency PRN    Site / Wound Assessment Granulation tissue    % Wound base Red or Granulating 95%    % Wound base Yellow/Fibrinous Exudate 5%    Peri-wound Assessment Edema;Erythema (blanchable)    Wound Length (cm) 2.4 cm    Wound Width (cm) 2 cm    Wound Depth (cm) 0.2 cm    Wound Volume (cm^3) 0.96 cm^3    Wound Surface Area (cm^2) 4.8 cm^2    Margins Unattached edges (unapproximated)    Drainage Amount Minimal    Drainage Description Serosanguineous    Treatment Cleansed;Debridement (Selective)    Selective Debridement - Location wound bed    Selective Debridement - Tools Used Scalpel    Selective Debridement - Tissue Removed slough    Wound Therapy - Clinical Statement Wound presents with improved approximation especially at 7 o'clock.  Superior wound with slough that was removed with just cleansing leg, no selective debridement required this session.  Pt with PT next apt scheduled.  Continued with xeroform as slough easily removed from wound bed.  Reports of comfort at  EOS.    Wound Therapy - Functional Problem List gait and balance    Factors Delaying/Impairing Wound Healing Diabetes Mellitus;Vascular compromise;Altered sensation    Wound Therapy - Frequency 2X / week    Wound Plan xeroform in wound bed.  Continue wiht Juxtafit.    Dressing  xerform, hydrogel, vaseline, 4x4,  xerform to rash and blister, juxtafit with liner                       PT Short Term Goals - 02/25/21 1502       PT SHORT TERM GOAL #1   Title Patient will have at least 50% granualtion tissue.    Time 3    Period Weeks    Status Achieved    Target Date 12/25/20               PT Long Term Goals - 01/03/21 1344       PT LONG TERM GOAL #1   Title Patient's wound will be healed to reduce the risk of infection.    Time 6    Period Weeks    Status On-going      PT LONG TERM GOAL #2   Title Patient will be able to verbalize completion of daily skin checks to reduce the risk of further wound development.    Time 6    Period Weeks    Status On-going                    Patient will benefit from skilled therapeutic intervention in order to improve the following deficits and impairments:     Visit Diagnosis: Ulcer of right pretibial region, limited to breakdown of skin ([place])  Other abnormalities of gait and mobility     Problem List Patient Active Problem List   Diagnosis Date Noted   Advanced nonexudative age-related macular degeneration of right eye without subfoveal involvement 11/15/2020   Advanced nonexudative age-related macular degeneration of left eye without subfoveal involvement 11/15/2020   Diabetes mellitus without complication ([place]) 74/05/1447   Posterior vitreous detachment of both eyes 11/15/2020   Gait disturbance, post-stroke 11/02/2017   Aphasia as late effect of cerebrovascular accident 11/02/2017   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   Aphasia    PAF (paroxysmal atrial fibrillation) ([place])  Diabetes mellitus type 2 in obese (HCC)    Benign essential HTN    Hypothyroidism    Hyperlipidemia    Stroke (cerebrum) ([place]) 10/02/2017   Afib ([place]) 01/10/2017   SOB (shortness of breath) 51/76/1607   Acute diastolic CHF (congestive heart failure) ([place]) 01/10/2017   Atrial fibrillation with RVR ([place]) 01/10/2017   Claudication ([place]) 07/25/2016   Paroxysmal atrial fibrillation ([place]) 07/25/2016   Odynophagia 04/10/2014   Allergic rhinitis 03/23/2014   Chronic rhinitis 12/22/2013   Intrinsic asthma 07/15/2013   Constipation 08/22/2011   Obesity 09/20/2009   G E R D 11/29/2007   Celiac disease 11/29/2007   Ihor Austin, LPTA/CLT; CBIS [phone]  Aldona Lento 04/23/2021, 5:15 PM  [place] [address] [place], [place], [address] Phone: [phone]   Fax:  [phone]  Name: [name] MRN: [id number] Date of Birth: [birth year]

## 2021-04-24 ENCOUNTER — Ambulatory Visit (HOSPITAL_COMMUNITY): Payer: PPO | Admitting: Physical Therapy

## 2021-04-26 ENCOUNTER — Ambulatory Visit (HOSPITAL_COMMUNITY): Payer: PPO | Admitting: Physical Therapy

## 2021-04-26 ENCOUNTER — Other Ambulatory Visit: Payer: Self-pay

## 2021-04-26 DIAGNOSIS — R2689 Other abnormalities of gait and mobility: Secondary | ICD-10-CM

## 2021-04-26 DIAGNOSIS — L97811 Non-pressure chronic ulcer of other part of right lower leg limited to breakdown of skin: Secondary | ICD-10-CM | POA: Diagnosis not present

## 2021-04-26 NOTE — Therapy (Signed)
[place], [place], [address] Phone: [phone]   Fax:  [phone]  Wound Care Therapy  Patient Details  Name: [name] MRN: [id number] Date of Birth: Apr 04, 1929 Referring Provider (PT): [name]   Encounter Date: 04/26/2021   PT End of Session - 04/26/21 1614     Visit Number 37    Number of Visits 12    Date for PT Re-Evaluation 05/21/21    Authorization Type [employer] (no auth req, no visit limit)    Progress Note Due on Visit 71    PT Start Time 1530    PT Stop Time 1600    PT Time Calculation (min) 30 min    Activity Tolerance Patient tolerated treatment well    Behavior During Therapy [facility] for tasks assessed/performed             Past Medical History:  Diagnosis Date   Allergic rhinitis    Asthmatic bronchitis    Atrial fibrillation ([place])    Celiac disease    Cellulitis    CKD (chronic kidney disease)    Diabetes ([place])    Diverticulosis    Fx. left wrist 1987   Gastric polyps    GERD (gastroesophageal reflux disease)    History of pneumonia    Hypertension    Hypothyroidism    Iron deficiency anemia    Melanoma ([place])    Neuropathy    PAF (paroxysmal atrial fibrillation) ([place])    Prostate cancer ([place])    REM sleep behavior disorder    RLS (restless legs syndrome)    Sleep apnea    Spasticity    Stroke ([place]) 09/2017   Tremor    Type 2 diabetes mellitus ([place])     Past Surgical History:  Procedure Laterality Date   APPENDECTOMY  1978   CARPAL TUNNEL RELEASE Left    CATARACT EXTRACTION  03/2011   bilateral    CHOLECYSTECTOMY  2001   COLONOSCOPY W/ BIOPSIES  04/27/2008   diverticulosis, duodenitis   FOOT SURGERY     HERNIA REPAIR  1994   bilateral   KNEE SURGERY Bilateral    x 2   LUNG SURGERY  2001   for infection   PROSTATECTOMY  2002   UPPER GASTROINTESTINAL ENDOSCOPY  04/27/2008   celiac disease, gastric polyps    There were no vitals filed for  this visit.               Wound Therapy - 04/26/21 0001     Subjective Pt states that his lower wound is fine but the more superior wound hurts a little    Patient and Family Stated Goals wound to heal    Date of Onset 09/06/20    Prior Treatments MD and self care    Pain Score 2     Evaluation and Treatment Procedures Explained to Patient/Family Yes    Evaluation and Treatment Procedures agreed to    Wound Properties Date First Assessed: 04/26/21 Time First Assessed: 0175 Wound Type: Non-pressure wound Location: Leg Location Orientation: Right Wound Description (Comments): superior to original wound Present on Admission: No   Dressing Type Compression wrap    Dressing Changed Changed    Dressing Status Clean;Dry    Dressing Change Frequency PRN    Site / Wound Assessment Dusky    % Wound base Red or Granulating 20%    % Wound base Yellow/Fibrinous Exudate 80%    Peri-wound  Assessment Edema    Wound Length (cm) 0.5 cm    Wound Width (cm) 1 cm    Wound Surface Area (cm^2) 0.5 cm^2    Drainage Amount None    Treatment Cleansed;Debridement (Selective)    Wound Properties Date First Assessed: 12/04/20 Time First Assessed: 8588 Wound Type: Venous stasis ulcer Location: Leg Location Orientation: Right Present on Admission: Yes   Dressing Type Santyl;Gauze (Comment)    Dressing Changed Changed    Dressing Status Old drainage    Dressing Change Frequency PRN    Site / Wound Assessment Granulation tissue;Yellow    % Wound base Red or Granulating 90%    % Wound base Yellow/Fibrinous Exudate 10%    Peri-wound Assessment Edema;Erythema (blanchable)    Wound Length (cm) 2.4 cm    Wound Width (cm) 1.6 cm    Wound Depth (cm) 0.2 cm    Wound Volume (cm^3) 0.77 cm^3    Wound Surface Area (cm^2) 3.84 cm^2    Margins Epibole (rolled edges)    Drainage Amount Minimal    Drainage Description Serous    Treatment Cleansed;Debridement (Selective)    Selective Debridement - Location  wound beds    Selective Debridement - Tools Used Scalpel    Selective Debridement - Tissue Removed slough    Wound Therapy - Clinical Statement Recieved order to treat second wound which was present on 04/16/2021.  Wound measurments taken.  Pt primary wound had increased slough in wound bed therefore therapist returned to santyl for both wounds.  Pt is using and has no questions on compression garment.  Pt primary wound is continuing to approximate.    Wound Therapy - Functional Problem List gait and balance    Factors Delaying/Impairing Wound Healing Diabetes Mellitus;Vascular compromise;Altered sensation    Wound Therapy - Frequency 2X / week    Wound Plan continue with wound care    Dressing  Santyl, hydrogel, 2x2, kerlix and juxtafit with liner                       PT Short Term Goals - 02/25/21 1502       PT SHORT TERM GOAL #1   Title Patient will have at least 50% granualtion tissue.    Time 3    Period Weeks    Status Achieved    Target Date 12/25/20               PT Long Term Goals - 01/03/21 1344       PT LONG TERM GOAL #1   Title Patient's wound will be healed to reduce the risk of infection.    Time 6    Period Weeks    Status On-going      PT LONG TERM GOAL #2   Title Patient will be able to verbalize completion of daily skin checks to reduce the risk of further wound development.    Time 6    Period Weeks    Status On-going                   Plan - 04/26/21 1615     Clinical Impression Statement see above    Personal Factors and Comorbidities Age;Comorbidity 3+;Past/Current Experience;Fitness;Time since onset of injury/illness/exacerbation    Comorbidities DM, CKD, neuorpathy, Hx CVA    Examination-Activity Limitations Locomotion Level;Transfers;Squat;Stand;Dressing    Examination-Participation Restrictions Community Activity    Stability/Clinical Decision Making Stable/Uncomplicated    Rehab Potential Good  PT Frequency 3x /  week    PT Duration 6 weeks   increased time secondary to slow healing wound   PT Treatment/Interventions Gait training;Stair training;Functional mobility training;DME Instruction;Therapeutic activities;Therapeutic exercise;Balance training;Neuromuscular re-education;Patient/family education;Compression bandaging;Manual lymph drainage;Manual techniques    PT Next Visit Plan Continue with both chemical and mechanical debridement to remove adherent eschar to allow healing to occure    Consulted and Agree with Plan of Care Patient;Family member/caregiver    Family Member Consulted wife             Patient will benefit from skilled therapeutic intervention in order to improve the following deficits and impairments:  Abnormal gait, Decreased skin integrity, Decreased mobility, Impaired sensation  Visit Diagnosis: Ulcer of right pretibial region, limited to breakdown of skin (HCC)  Other abnormalities of gait and mobility     Problem List Patient Active Problem List   Diagnosis Date Noted   Advanced nonexudative age-related macular degeneration of right eye without subfoveal involvement 11/15/2020   Advanced nonexudative age-related macular degeneration of left eye without subfoveal involvement 11/15/2020   Diabetes mellitus without complication ([place]) 11/12/4386   Posterior vitreous detachment of both eyes 11/15/2020   Gait disturbance, post-stroke 11/02/2017   Aphasia as late effect of cerebrovascular accident 11/02/2017   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   Aphasia    PAF (paroxysmal atrial fibrillation) ([place])    Diabetes mellitus type 2 in obese ([place])    Benign essential HTN    Hypothyroidism    Hyperlipidemia    Stroke (cerebrum) ([place]) 10/02/2017   Afib ([place]) 01/10/2017   SOB (shortness of breath) 87/57/9728   Acute diastolic CHF (congestive heart failure) ([place]) 01/10/2017   Atrial fibrillation with RVR ([place]) 01/10/2017   Claudication ([place]) 07/25/2016    Paroxysmal atrial fibrillation ([place]) 07/25/2016   Odynophagia 04/10/2014   Allergic rhinitis 03/23/2014   Chronic rhinitis 12/22/2013   Intrinsic asthma 07/15/2013   Constipation 08/22/2011   Obesity 09/20/2009   G E R D 11/29/2007   Celiac disease 11/29/2007   Rayetta Humphrey, PT CLT [phone]  04/26/2021, 4:15 PM  [place] [address] [place], [place], [address] Phone: [phone]   Fax:  [phone]  Name: [name] MRN: [id number] Date of Birth: [birth year]

## 2021-04-29 ENCOUNTER — Ambulatory Visit (HOSPITAL_COMMUNITY): Payer: PPO | Admitting: Physical Therapy

## 2021-04-30 ENCOUNTER — Ambulatory Visit (HOSPITAL_COMMUNITY): Payer: PPO

## 2021-04-30 ENCOUNTER — Other Ambulatory Visit: Payer: Self-pay

## 2021-04-30 ENCOUNTER — Encounter (HOSPITAL_COMMUNITY): Payer: Self-pay

## 2021-04-30 DIAGNOSIS — L97811 Non-pressure chronic ulcer of other part of right lower leg limited to breakdown of skin: Secondary | ICD-10-CM | POA: Diagnosis not present

## 2021-04-30 DIAGNOSIS — B351 Tinea unguium: Secondary | ICD-10-CM | POA: Diagnosis not present

## 2021-04-30 DIAGNOSIS — E1142 Type 2 diabetes mellitus with diabetic polyneuropathy: Secondary | ICD-10-CM | POA: Diagnosis not present

## 2021-04-30 DIAGNOSIS — I110 Hypertensive heart disease with heart failure: Secondary | ICD-10-CM | POA: Diagnosis not present

## 2021-04-30 DIAGNOSIS — E782 Mixed hyperlipidemia: Secondary | ICD-10-CM | POA: Diagnosis not present

## 2021-04-30 DIAGNOSIS — R2689 Other abnormalities of gait and mobility: Secondary | ICD-10-CM

## 2021-04-30 DIAGNOSIS — L601 Onycholysis: Secondary | ICD-10-CM | POA: Diagnosis not present

## 2021-04-30 NOTE — Therapy (Signed)
[place], [place], [address] Phone: [phone]   Fax:  [phone]  Wound Care Therapy  Patient Details  Name: ZADKIEL DRAGAN MRN: [id number] Date of Birth: September 06, 1929 Referring Provider (PT): [name]   Encounter Date: 04/30/2021   PT End of Session - 04/30/21 1724     Visit Number 1    Number of Visits 91    Date for PT Re-Evaluation 05/21/21    Authorization Type [employer] (no auth req, no visit limit)    Progress Note Due on Visit 22    PT Start Time 1530    PT Stop Time 1608    PT Time Calculation (min) 38 min    Activity Tolerance Patient tolerated treatment well    Behavior During Therapy [facility] for tasks assessed/performed             Past Medical History:  Diagnosis Date   Allergic rhinitis    Asthmatic bronchitis    Atrial fibrillation ([place])    Celiac disease    Cellulitis    CKD (chronic kidney disease)    Diabetes ([place])    Diverticulosis    Fx. left wrist 1987   Gastric polyps    GERD (gastroesophageal reflux disease)    History of pneumonia    Hypertension    Hypothyroidism    Iron deficiency anemia    Melanoma ([place])    Neuropathy    PAF (paroxysmal atrial fibrillation) (HCC)    Prostate cancer ([place])    REM sleep behavior disorder    RLS (restless legs syndrome)    Sleep apnea    Spasticity    Stroke ([place]) 09/2017   Tremor    Type 2 diabetes mellitus ([place])     Past Surgical History:  Procedure Laterality Date   APPENDECTOMY  1978   CARPAL TUNNEL RELEASE Left    CATARACT EXTRACTION  03/2011   bilateral    CHOLECYSTECTOMY  2001   COLONOSCOPY W/ BIOPSIES  04/27/2008   diverticulosis, duodenitis   FOOT SURGERY     HERNIA REPAIR  1994   bilateral   KNEE SURGERY Bilateral    x 2   LUNG SURGERY  2001   for infection   PROSTATECTOMY  2002   UPPER GASTROINTESTINAL ENDOSCOPY  04/27/2008   celiac disease, gastric polyps    There were no vitals filed for  this visit.    Subjective Assessment - 04/30/21 1714     Subjective Pt arrived wiht wife for session.  Reports had a podiatrist apt earlier today and brought new order for 4th toe wound.   Dressings are intact.  Reports soreness superior wound, pain scale 5/10.    Patient is accompained by: Family member                       Wound Therapy - 04/30/21 0001     Subjective Pt arrived wiht wife for session.  Reports had a podiatrist apt earlier today and brought new order for 4th toe wound.   Dressings are intact.  Reports soreness superior wound, pain scale 5/10.    Patient and Family Stated Goals wound to heal    Date of Onset 09/06/20    Prior Treatments MD and self care    Pain Scale 0-10    Pain Score 5     Evaluation and Treatment Procedures Explained to Patient/Family Yes    Evaluation and Treatment Procedures agreed  to    Wound Properties Date First Assessed: 04/26/21 Time First Assessed: 9937 Wound Type: Non-pressure wound Location: Leg Location Orientation: Right Wound Description (Comments): superior to original wound Present on Admission: No   Wound Image Images linked: 1    Dressing Type --   Santyl, 2x2, kerlix   Dressing Changed Changed    Dressing Status Clean;Dry    Dressing Change Frequency PRN    Site / Wound Assessment Dusky;Yellow    % Wound base Red or Granulating 20%    % Wound base Yellow/Fibrinous Exudate 80%    Peri-wound Assessment Edema    Drainage Amount None    Treatment Cleansed;Debridement (Selective)    Wound Properties Date First Assessed: 12/04/20 Time First Assessed: 1545 Wound Type: Venous stasis ulcer Location: Leg Location Orientation: Right Present on Admission: Yes   Dressing Type Hydrogel;Impregnated gauze (bismuth);Gauze (Comment)   hydrogel, xeroform, 4x4, kerlix   Dressing Changed Changed    Dressing Status Old drainage    Dressing Change Frequency PRN    Site / Wound Assessment Granulation tissue    % Wound base Red or  Granulating 95%    % Wound base Yellow/Fibrinous Exudate 5%    Peri-wound Assessment Edema;Erythema (blanchable)    Wound Length (cm) 2.3 cm    Wound Width (cm) 1.6 cm    Wound Depth (cm) 0.2 cm    Wound Volume (cm^3) 0.74 cm^3    Wound Surface Area (cm^2) 3.68 cm^2    Margins Epibole (rolled edges)    Drainage Amount Minimal    Drainage Description Serous    Treatment Cleansed;Debridement (Selective)    Selective Debridement - Location wound beds    Selective Debridement - Tools Used Scalpel    Selective Debridement - Tissue Removed slough    Wound Therapy - Clinical Statement Slough easily removed from primary wound bed, returned to hydrogel and xeroform.  Pt with new order for wound on 4th toe from podiatrist.  No selective debridment complete to this wound, did cleanse, moisturize and rebandaged with medipore tape. (see Media).  Superior wound with adherent slough, continued with santyl.  Overall Rt LE extremely dry, educated pt on importance of keeping moisturized for skin integrity.  Reports of comfort at EOS.    Wound Therapy - Functional Problem List gait and balance    Factors Delaying/Impairing Wound Healing Diabetes Mellitus;Vascular compromise;Altered sensation    Wound Therapy - Frequency 2X / week    Wound Plan continue with wound care    Dressing  Superior: santyl, 2x2    Dressing Primary wound: hydrogel, xeroform, 4x4, kerlix                       PT Short Term Goals - 02/25/21 1502       PT SHORT TERM GOAL #1   Title Patient will have at least 50% granualtion tissue.    Time 3    Period Weeks    Status Achieved    Target Date 12/25/20               PT Long Term Goals - 01/03/21 1344       PT LONG TERM GOAL #1   Title Patient's wound will be healed to reduce the risk of infection.    Time 6    Period Weeks    Status On-going      PT LONG TERM GOAL #2   Title Patient will be able to verbalize completion of daily  skin checks to reduce the  risk of further wound development.    Time 6    Period Weeks    Status On-going                    Patient will benefit from skilled therapeutic intervention in order to improve the following deficits and impairments:     Visit Diagnosis: Ulcer of right pretibial region, limited to breakdown of skin ([place])  Other abnormalities of gait and mobility     Problem List Patient Active Problem List   Diagnosis Date Noted   Advanced nonexudative age-related macular degeneration of right eye without subfoveal involvement 11/15/2020   Advanced nonexudative age-related macular degeneration of left eye without subfoveal involvement 11/15/2020   Diabetes mellitus without complication ([place]) 03/50/0938   Posterior vitreous detachment of both eyes 11/15/2020   Gait disturbance, post-stroke 11/02/2017   Aphasia as late effect of cerebrovascular accident 11/02/2017   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) 10/02/2017   Afib ([place]) 01/10/2017   SOB (shortness of breath) 18/29/9371   Acute diastolic CHF (congestive heart failure) ([place]) 01/10/2017   Atrial fibrillation with RVR ([place]) 01/10/2017   Claudication ([place]) 07/25/2016   Paroxysmal atrial fibrillation ([place]) 07/25/2016   Odynophagia 04/10/2014   Allergic rhinitis 03/23/2014   Chronic rhinitis 12/22/2013   Intrinsic asthma 07/15/2013   Constipation 08/22/2011   Obesity 09/20/2009   G E R D 11/29/2007   Celiac disease 11/29/2007   Ihor Austin, LPTA/CLT; CBIS [phone]  Aldona Lento 04/30/2021, 5:25 PM  [place] [address] [place], [place], [address] Phone: [phone]   Fax:  [phone]  Name: JALIK GELLATLY MRN: [id number] Date of Birth:  05-11-1929

## 2021-05-01 ENCOUNTER — Ambulatory Visit (HOSPITAL_COMMUNITY): Payer: PPO | Admitting: Physical Therapy

## 2021-05-02 DIAGNOSIS — R0789 Other chest pain: Secondary | ICD-10-CM | POA: Diagnosis not present

## 2021-05-02 DIAGNOSIS — I482 Chronic atrial fibrillation, unspecified: Secondary | ICD-10-CM | POA: Diagnosis not present

## 2021-05-02 DIAGNOSIS — K900 Celiac disease: Secondary | ICD-10-CM | POA: Diagnosis not present

## 2021-05-02 DIAGNOSIS — Z0001 Encounter for general adult medical examination with abnormal findings: Secondary | ICD-10-CM | POA: Diagnosis not present

## 2021-05-02 DIAGNOSIS — E039 Hypothyroidism, unspecified: Secondary | ICD-10-CM | POA: Diagnosis not present

## 2021-05-02 DIAGNOSIS — E875 Hyperkalemia: Secondary | ICD-10-CM | POA: Diagnosis not present

## 2021-05-02 DIAGNOSIS — N1832 Chronic kidney disease, stage 3b: Secondary | ICD-10-CM | POA: Diagnosis not present

## 2021-05-02 DIAGNOSIS — I129 Hypertensive chronic kidney disease with stage 1 through stage 4 chronic kidney disease, or unspecified chronic kidney disease: Secondary | ICD-10-CM | POA: Diagnosis not present

## 2021-05-02 DIAGNOSIS — D638 Anemia in other chronic diseases classified elsewhere: Secondary | ICD-10-CM | POA: Diagnosis not present

## 2021-05-02 DIAGNOSIS — E118 Type 2 diabetes mellitus with unspecified complications: Secondary | ICD-10-CM | POA: Diagnosis not present

## 2021-05-02 DIAGNOSIS — I5032 Chronic diastolic (congestive) heart failure: Secondary | ICD-10-CM | POA: Diagnosis not present

## 2021-05-02 DIAGNOSIS — E782 Mixed hyperlipidemia: Secondary | ICD-10-CM | POA: Diagnosis not present

## 2021-05-03 ENCOUNTER — Ambulatory Visit (HOSPITAL_COMMUNITY): Payer: PPO | Admitting: Physical Therapy

## 2021-05-03 ENCOUNTER — Other Ambulatory Visit: Payer: Self-pay

## 2021-05-03 DIAGNOSIS — L97811 Non-pressure chronic ulcer of other part of right lower leg limited to breakdown of skin: Secondary | ICD-10-CM

## 2021-05-03 DIAGNOSIS — R2689 Other abnormalities of gait and mobility: Secondary | ICD-10-CM

## 2021-05-03 NOTE — Therapy (Signed)
[place], [place], [address] Phone: [phone]   Fax:  [phone]  Wound Care Therapy  Patient Details  Name: PAPA PIERCEFIELD MRN: [id number] Date of Birth: 08/16/1929 Referring Provider (PT): [name]   Encounter Date: 05/03/2021   PT End of Session - 05/03/21 1628     Visit Number 41    Number of Visits 27    Date for PT Re-Evaluation 05/21/21    Authorization Type [employer] (no auth req, no visit limit)    Progress Note Due on Visit 54    PT Start Time 1445    PT Stop Time 1525    PT Time Calculation (min) 40 min    Activity Tolerance Patient tolerated treatment well    Behavior During Therapy [facility] for tasks assessed/performed             Past Medical History:  Diagnosis Date   Allergic rhinitis    Asthmatic bronchitis    Atrial fibrillation ([place])    Celiac disease    Cellulitis    CKD (chronic kidney disease)    Diabetes ([place])    Diverticulosis    Fx. left wrist 1987   Gastric polyps    GERD (gastroesophageal reflux disease)    History of pneumonia    Hypertension    Hypothyroidism    Iron deficiency anemia    Melanoma ([place])    Neuropathy    PAF (paroxysmal atrial fibrillation) (HCC)    Prostate cancer ([place])    REM sleep behavior disorder    RLS (restless legs syndrome)    Sleep apnea    Spasticity    Stroke ([place]) 09/2017   Tremor    Type 2 diabetes mellitus ([place])     Past Surgical History:  Procedure Laterality Date   APPENDECTOMY  1978   CARPAL TUNNEL RELEASE Left    CATARACT EXTRACTION  03/2011   bilateral    CHOLECYSTECTOMY  2001   COLONOSCOPY W/ BIOPSIES  04/27/2008   diverticulosis, duodenitis   FOOT SURGERY     HERNIA REPAIR  1994   bilateral   KNEE SURGERY Bilateral    x 2   LUNG SURGERY  2001   for infection   PROSTATECTOMY  2002   UPPER GASTROINTESTINAL ENDOSCOPY  04/27/2008   celiac disease, gastric polyps    There were no vitals filed for  this visit.      [facility] PT Assessment - 05/03/21 0001       Assessment   Medical Diagnosis Chronic venous stasis wound    Referring Provider (PT) [name]                     Wound Therapy - 05/03/21 0001     Subjective Reports pain is better and he barely has any pain    Patient and Family Stated Goals wound to heal    Date of Onset 09/06/20    Prior Treatments MD and self care    Pain Scale 0-10    Pain Score 0-No pain    Evaluation and Treatment Procedures Explained to Patient/Family Yes    Evaluation and Treatment Procedures agreed to    Wound Properties Date First Assessed: 04/26/21 Time First Assessed: 6160 Wound Type: Non-pressure wound Location: Leg Location Orientation: Right Wound Description (Comments): superior to original wound Present on Admission: No   Wound Image Images linked: 1    Dressing Type --   kelix,  santyl   Dressing Changed Changed    Dressing Status Clean;Dry    Dressing Change Frequency PRN    Site / Wound Assessment Dusky;Pink    % Wound base Red or Granulating 10%    % Wound base Yellow/Fibrinous Exudate 90%    Peri-wound Assessment Edema    Drainage Amount None    Treatment Cleansed;Debridement (Selective)    Wound Properties Date First Assessed: 12/04/20 Time First Assessed: 1545 Wound Type: Venous stasis ulcer Location: Leg Location Orientation: Right Present on Admission: Yes   Wound Image Images linked: 1    Dressing Type Hydrogel;Impregnated gauze (bismuth)    Dressing Changed Changed    Dressing Status Old drainage    Dressing Change Frequency PRN    Site / Wound Assessment Granulation tissue    % Wound base Red or Granulating 95%    % Wound base Yellow/Fibrinous Exudate 5%    Peri-wound Assessment Edema;Erythema (blanchable)    Margins Unattached edges (unapproximated)    Drainage Amount Minimal    Drainage Description Serosanguineous    Treatment Cleansed;Debridement (Selective)    Selective Debridement - Location  wound beds    Selective Debridement - Tools Used Scalpel;Forceps    Selective Debridement - Tissue Removed slough    Wound Therapy - Clinical Statement Examined toe with no noticeable wound on 4th toe. Red spot noted on 5th toe and picture taken, but skin not broken. Able to debride superior wound but wound deeper then it appears with slough. Continued with Santyl and hydrogel on this wound. Continued with xeroform and hydrogel on original wound. Increased swelling noted and noticed that juxtafix was put on more proximally. Fixed this with rewrapping. Will continue to take pictures each session to monitor change.    Wound Therapy - Functional Problem List gait and balance    Factors Delaying/Impairing Wound Healing Diabetes Mellitus;Vascular compromise;Altered sensation    Wound Therapy - Frequency 2X / week    Wound Plan continue with wound care    Dressing  Superior: santyl, 2x2    Dressing Primary wound: hydrogel, xeroform, 4x4, kerlix                       PT Short Term Goals - 02/25/21 1502       PT SHORT TERM GOAL #1   Title Patient will have at least 50% granualtion tissue.    Time 3    Period Weeks    Status Achieved    Target Date 12/25/20               PT Long Term Goals - 01/03/21 1344       PT LONG TERM GOAL #1   Title Patient's wound will be healed to reduce the risk of infection.    Time 6    Period Weeks    Status On-going      PT LONG TERM GOAL #2   Title Patient will be able to verbalize completion of daily skin checks to reduce the risk of further wound development.    Time 6    Period Weeks    Status On-going                   Plan - 05/03/21 1628     Clinical Impression Statement see above    Personal Factors and Comorbidities Age;Comorbidity 3+;Past/Current Experience;Fitness;Time since onset of injury/illness/exacerbation    Comorbidities DM, CKD, neuorpathy, Hx CVA    Examination-Activity Limitations Locomotion  Level;Transfers;Squat;Stand;Dressing    Examination-Participation Restrictions Community Activity    Stability/Clinical Decision Making Stable/Uncomplicated    Rehab Potential Good    PT Frequency 3x / week    PT Duration 6 weeks   increased time secondary to slow healing wound   PT Treatment/Interventions Gait training;Stair training;Functional mobility training;DME Instruction;Therapeutic activities;Therapeutic exercise;Balance training;Neuromuscular re-education;Patient/family education;Compression bandaging;Manual lymph drainage;Manual techniques    PT Next Visit Plan Continue with both chemical and mechanical debridement to remove adherent eschar to allow healing to occure    Consulted and Agree with Plan of Care Patient;Family member/caregiver    Family Member Consulted wife             Patient will benefit from skilled therapeutic intervention in order to improve the following deficits and impairments:  Abnormal gait, Decreased skin integrity, Decreased mobility, Impaired sensation  Visit Diagnosis: Ulcer of right pretibial region, limited to breakdown of skin (HCC)  Other abnormalities of gait and mobility     Problem List Patient Active Problem List   Diagnosis Date Noted   Advanced nonexudative age-related macular degeneration of right eye without subfoveal involvement 11/15/2020   Advanced nonexudative age-related macular degeneration of left eye without subfoveal involvement 11/15/2020   Diabetes mellitus without complication ([place]) 41/42/3953   Posterior vitreous detachment of both eyes 11/15/2020   Gait disturbance, post-stroke 11/02/2017   Aphasia as late effect of cerebrovascular accident 11/02/2017   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   Aphasia    PAF (paroxysmal atrial fibrillation) ([place])    Diabetes mellitus type 2 in obese ([place])    Benign essential HTN    Hypothyroidism    Hyperlipidemia    Stroke (cerebrum) ([place]) 10/02/2017   Afib ([place])  01/10/2017   SOB (shortness of breath) 20/23/3435   Acute diastolic CHF (congestive heart failure) ([place]) 01/10/2017   Atrial fibrillation with RVR ([place]) 01/10/2017   Claudication ([place]) 07/25/2016   Paroxysmal atrial fibrillation ([place]) 07/25/2016   Odynophagia 04/10/2014   Allergic rhinitis 03/23/2014   Chronic rhinitis 12/22/2013   Intrinsic asthma 07/15/2013   Constipation 08/22/2011   Obesity 09/20/2009   G E R D 11/29/2007   Celiac disease 11/29/2007   4:47 PM, 05/03/21 Jerene Pitch, DPT Physical Therapy with [facility]  [phone] office   [place] [address] [place], [place], [address] Phone: [phone]   Fax:  [phone]  Name: BUREN HAVEY MRN: [id number] Date of Birth: 12-Nov-1928

## 2021-05-06 ENCOUNTER — Ambulatory Visit (HOSPITAL_COMMUNITY): Payer: PPO | Admitting: Physical Therapy

## 2021-05-07 ENCOUNTER — Other Ambulatory Visit: Payer: Self-pay

## 2021-05-07 ENCOUNTER — Ambulatory Visit (HOSPITAL_COMMUNITY): Payer: PPO

## 2021-05-07 ENCOUNTER — Encounter (HOSPITAL_COMMUNITY): Payer: Self-pay

## 2021-05-07 DIAGNOSIS — L97811 Non-pressure chronic ulcer of other part of right lower leg limited to breakdown of skin: Secondary | ICD-10-CM | POA: Diagnosis not present

## 2021-05-07 DIAGNOSIS — R2689 Other abnormalities of gait and mobility: Secondary | ICD-10-CM

## 2021-05-07 NOTE — Therapy (Signed)
[place], [place], [address] Phone: [phone]   Fax:  [phone]  Wound Care Therapy  Patient Details  Name: [name] MRN: [id number] Date of Birth: 09/19/1929 Referring Provider (PT): [name]   Encounter Date: 05/07/2021   PT End of Session - 05/07/21 1913     Visit Number 91    Number of Visits 70    Date for PT Re-Evaluation 05/21/21    Authorization Type [employer] (no auth req, no visit limit)    Progress Note Due on Visit 35    PT Start Time 1450    PT Stop Time 1528    PT Time Calculation (min) 38 min    Activity Tolerance Patient tolerated treatment well    Behavior During Therapy [facility] for tasks assessed/performed             Past Medical History:  Diagnosis Date   Allergic rhinitis    Asthmatic bronchitis    Atrial fibrillation ([place])    Celiac disease    Cellulitis    CKD (chronic kidney disease)    Diabetes ([place])    Diverticulosis    Fx. left wrist 1987   Gastric polyps    GERD (gastroesophageal reflux disease)    History of pneumonia    Hypertension    Hypothyroidism    Iron deficiency anemia    Melanoma ([place])    Neuropathy    PAF (paroxysmal atrial fibrillation) (HCC)    Prostate cancer ([place])    REM sleep behavior disorder    RLS (restless legs syndrome)    Sleep apnea    Spasticity    Stroke ([place]) 09/2017   Tremor    Type 2 diabetes mellitus ([place])     Past Surgical History:  Procedure Laterality Date   APPENDECTOMY  1978   CARPAL TUNNEL RELEASE Left    CATARACT EXTRACTION  03/2011   bilateral    CHOLECYSTECTOMY  2001   COLONOSCOPY W/ BIOPSIES  04/27/2008   diverticulosis, duodenitis   FOOT SURGERY     HERNIA REPAIR  1994   bilateral   KNEE SURGERY Bilateral    x 2   LUNG SURGERY  2001   for infection   PROSTATECTOMY  2002   UPPER GASTROINTESTINAL ENDOSCOPY  04/27/2008   celiac disease, gastric polyps    There were no vitals filed for  this visit.    Subjective Assessment - 05/07/21 1541     Subjective Pt arrived with wife who sat through session.  Reports Lt knee soreness and walking slow.  Dressings intact, no reports of pain Rt LE.    Patient is accompained by: Family member   wife   Currently in Pain? Yes    Pain Score 7     Pain Location Knee    Pain Orientation Left    Pain Descriptors / Indicators Aching;Sore    Pain Type Acute pain    Pain Onset More than a month ago    Pain Frequency Intermittent                       Wound Therapy - 05/07/21 1541     Subjective Pt arrived with wife who sat through session.  Reports Lt knee soreness and walking slow.  Dressings intact, no reports of pain Rt LE.    Patient and Family Stated Goals wound to heal    Date of Onset 09/06/20  Prior Treatments MD and self care    Pain Scale 0-10    Evaluation and Treatment Procedures Explained to Patient/Family Yes    Evaluation and Treatment Procedures agreed to    Wound Properties Date First Assessed: 04/26/21 Time First Assessed: 3007 Wound Type: Non-pressure wound Location: Leg Location Orientation: Right Wound Description (Comments): superior to original wound Present on Admission: No   Wound Image Images linked: 1    Dressing Type Santyl;Gauze (Comment)   Santyl superior, xeroform primary wound with vaseline, lotion, kerlix and pt.'s juxtafit   Dressing Changed Changed    Dressing Status Clean;Dry    Dressing Change Frequency PRN    Site / Wound Assessment Dusky;Pink    % Wound base Red or Granulating 10%    % Wound base Yellow/Fibrinous Exudate 90%    Peri-wound Assessment Edema    Drainage Amount None    Treatment Cleansed;Debridement (Selective)    Wound Properties Date First Assessed: 12/04/20 Time First Assessed: 1545 Wound Type: Venous stasis ulcer Location: Leg Location Orientation: Right Present on Admission: Yes   Dressing Type Hydrogel;Impregnated gauze (bismuth)    Dressing Changed Changed     Dressing Status Old drainage    Dressing Change Frequency PRN    Site / Wound Assessment Granulation tissue   100% slough prior debridement   % Wound base Red or Granulating 95%    % Wound base Yellow/Fibrinous Exudate 5%    Peri-wound Assessment Edema;Erythema (blanchable)    Wound Length (cm) 2.2 cm    Wound Width (cm) 1.6 cm    Wound Depth (cm) 0.2 cm    Wound Volume (cm^3) 0.7 cm^3    Wound Surface Area (cm^2) 3.52 cm^2    Margins Attached edges (approximated)    Drainage Amount Minimal    Drainage Description Serosanguineous    Treatment Cleansed;Debridement (Selective)    Selective Debridement - Location wound beds    Selective Debridement - Tools Used Scalpel;Forceps    Selective Debridement - Tissue Removed slough    Wound Therapy - Clinical Statement Primary wound continues to improve in approximation and decreased depth in areas of wound.  Superior wound 100% adherent slough that becomes deeper with debridment.  Photo taken of 5th toe wiht no change in redness.  Noted increased rash on medial aspect of LE beside knee, photo taken of new rash and toe (see media).  Continued wiht hydrogel/xeroform on primary wound and santyl superior.    Wound Therapy - Functional Problem List gait and balance    Factors Delaying/Impairing Wound Healing Diabetes Mellitus;Vascular compromise;Altered sensation    Wound Therapy - Frequency 2X / week    Wound Plan continue with wound care    Dressing  Superior: santyl, 2x2    Dressing Primary wound: hydrogel, xeroform, 4x4, kerlix                       PT Short Term Goals - 02/25/21 1502       PT SHORT TERM GOAL #1   Title Patient will have at least 50% granualtion tissue.    Time 3    Period Weeks    Status Achieved    Target Date 12/25/20               PT Long Term Goals - 01/03/21 1344       PT LONG TERM GOAL #1   Title Patient's wound will be healed to reduce the risk of infection.    Time  6    Period Weeks     Status On-going      PT LONG TERM GOAL #2   Title Patient will be able to verbalize completion of daily skin checks to reduce the risk of further wound development.    Time 6    Period Weeks    Status On-going                    Patient will benefit from skilled therapeutic intervention in order to improve the following deficits and impairments:     Visit Diagnosis: Ulcer of right pretibial region, limited to breakdown of skin ([place])  Other abnormalities of gait and mobility     Problem List Patient Active Problem List   Diagnosis Date Noted   Advanced nonexudative age-related macular degeneration of right eye without subfoveal involvement 11/15/2020   Advanced nonexudative age-related macular degeneration of left eye without subfoveal involvement 11/15/2020   Diabetes mellitus without complication ([place]) 03/70/4888   Posterior vitreous detachment of both eyes 11/15/2020   Gait disturbance, post-stroke 11/02/2017   Aphasia as late effect of cerebrovascular accident 11/02/2017   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   Aphasia    PAF (paroxysmal atrial fibrillation) ([place])    Diabetes mellitus type 2 in obese ([place])    Benign essential HTN    Hypothyroidism    Hyperlipidemia    Stroke (cerebrum) ([place]) 10/02/2017   Afib ([place]) 01/10/2017   SOB (shortness of breath) 91/69/4503   Acute diastolic CHF (congestive heart failure) ([place]) 01/10/2017   Atrial fibrillation with RVR ([place]) 01/10/2017   Claudication ([place]) 07/25/2016   Paroxysmal atrial fibrillation ([place]) 07/25/2016   Odynophagia 04/10/2014   Allergic rhinitis 03/23/2014   Chronic rhinitis 12/22/2013   Intrinsic asthma 07/15/2013   Constipation 08/22/2011   Obesity 09/20/2009   G E R D 11/29/2007   Celiac disease 11/29/2007   Ihor Austin, LPTA/CLT; CBIS [phone]  Aldona Lento 05/07/2021, 7:24 PM  [place], [place], [address] Phone: [phone]   Fax:  [phone]  Name: AUNDRAY CARTLIDGE MRN: [id number] Date of Birth: September 27, 1929

## 2021-05-08 ENCOUNTER — Ambulatory Visit (HOSPITAL_COMMUNITY): Payer: PPO | Admitting: Physical Therapy

## 2021-05-10 ENCOUNTER — Ambulatory Visit (HOSPITAL_COMMUNITY): Payer: PPO | Admitting: Physical Therapy

## 2021-05-10 ENCOUNTER — Encounter (HOSPITAL_COMMUNITY): Payer: Self-pay | Admitting: Physical Therapy

## 2021-05-10 ENCOUNTER — Other Ambulatory Visit: Payer: Self-pay

## 2021-05-10 DIAGNOSIS — L97811 Non-pressure chronic ulcer of other part of right lower leg limited to breakdown of skin: Secondary | ICD-10-CM

## 2021-05-10 DIAGNOSIS — R2689 Other abnormalities of gait and mobility: Secondary | ICD-10-CM

## 2021-05-10 NOTE — Therapy (Signed)
[place], [place], [address] Phone: [phone]   Fax:  [phone]  Wound Care Therapy  Patient Details  Name: [name] MRN: [id number] Date of Birth: 1929/09/24 Referring Provider (PT): [name]   Encounter Date: 05/10/2021   PT End of Session - 05/10/21 1537     Visit Number 36    Number of Visits 66    Date for PT Re-Evaluation 05/21/21    Authorization Type [employer] (no auth req, no visit limit)    Progress Note Due on Visit 41    PT Start Time 1400    PT Stop Time 1445    PT Time Calculation (min) 45 min    Activity Tolerance Patient tolerated treatment well    Behavior During Therapy [facility] for tasks assessed/performed             Past Medical History:  Diagnosis Date   Allergic rhinitis    Asthmatic bronchitis    Atrial fibrillation ([place])    Celiac disease    Cellulitis    CKD (chronic kidney disease)    Diabetes ([place])    Diverticulosis    Fx. left wrist 1987   Gastric polyps    GERD (gastroesophageal reflux disease)    History of pneumonia    Hypertension    Hypothyroidism    Iron deficiency anemia    Melanoma ([place])    Neuropathy    PAF (paroxysmal atrial fibrillation) (HCC)    Prostate cancer ([place])    REM sleep behavior disorder    RLS (restless legs syndrome)    Sleep apnea    Spasticity    Stroke ([place]) 09/2017   Tremor    Type 2 diabetes mellitus ([place])     Past Surgical History:  Procedure Laterality Date   APPENDECTOMY  1978   CARPAL TUNNEL RELEASE Left    CATARACT EXTRACTION  03/2011   bilateral    CHOLECYSTECTOMY  2001   COLONOSCOPY W/ BIOPSIES  04/27/2008   diverticulosis, duodenitis   FOOT SURGERY     HERNIA REPAIR  1994   bilateral   KNEE SURGERY Bilateral    x 2   LUNG SURGERY  2001   for infection   PROSTATECTOMY  2002   UPPER GASTROINTESTINAL ENDOSCOPY  04/27/2008   celiac disease, gastric polyps    There were no vitals filed for  this visit.      [name] PT Assessment - 05/10/21 0001       Assessment   Medical Diagnosis Chronic venous stasis wound    Referring Provider (PT) [name]                     Wound Therapy - 05/10/21 0001     Subjective Reports no pain since last session. Reports his leg has been itchy where the rash is coming up his leg    Patient and Family Stated Goals wound to heal    Date of Onset 09/06/20    Prior Treatments MD and self care    Pain Scale Faces    Faces Pain Scale Hurts even more   with debridement   Evaluation and Treatment Procedures Explained to Patient/Family Yes    Evaluation and Treatment Procedures agreed to    Wound Properties Date First Assessed: 04/26/21 Time First Assessed: 6294 Wound Type: Non-pressure wound Location: Leg Location Orientation: Right Wound Description (Comments): superior to original wound Present on Admission: No  Dressing Type Hydrogel   santyl   Dressing Changed Changed    Dressing Status Old drainage    Dressing Change Frequency PRN    Site / Wound Assessment Dusky;Pink    % Wound base Red or Granulating 15%    % Wound base Yellow/Fibrinous Exudate 85%    Peri-wound Assessment Edema;Erythema (blanchable)   puncture wound spot inferior to wound at .3x.3x.3   Wound Length (cm) 0.8 cm   was   Wound Width (cm) 1.2 cm   was 1   Wound Depth (cm) 0.5 cm   was .5   Wound Volume (cm^3) 0.48 cm^3    Wound Surface Area (cm^2) 0.96 cm^2    Margins Unattached edges (unapproximated)    Drainage Amount Scant    Drainage Description Serous    Treatment Debridement (Selective);Cleansed    Wound Properties Date First Assessed: 12/04/20 Time First Assessed: 2956 Wound Type: Venous stasis ulcer Location: Leg Location Orientation: Right Present on Admission: Yes   Dressing Type Hydrogel;Impregnated gauze (bismuth)    Dressing Changed Changed    Dressing Status Old drainage    Dressing Change Frequency PRN    Site / Wound Assessment  Granulation tissue;Dusky    % Wound base Red or Granulating 95%    % Wound base Yellow/Fibrinous Exudate 5%    Peri-wound Assessment Edema;Erythema (blanchable)    Wound Length (cm) 2 cm   was 2.2   Wound Width (cm) 1.6 cm   was 1.3   Wound Depth (cm) 0.2 cm   was .2   Wound Volume (cm^3) 0.64 cm^3    Wound Surface Area (cm^2) 3.2 cm^2    Margins Attached edges (approximated)    Drainage Amount Minimal    Drainage Description Serous    Treatment Cleansed;Debridement (Selective)    Selective Debridement - Location wound beds    Selective Debridement - Tools Used Scalpel;Forceps    Selective Debridement - Tissue Removed slough    Wound Therapy - Clinical Statement Initial wound continues to approximate though slowly. Continued with Xeroform and hydrogel. Superior wound continues to increase in size but this is expected with debridement of slough. Wound is deeper then expected. Additionally, spot below this wound which was originally thought to be a small contusion below the larger contusion has opened up and is a small punched out wound. Cleansed this area and covered with Santyl and hydrogel. Covered leg with Vaseline and kerlix. Proximal rash very irritated but this is likely from the santyl. As patient does not tolerate debridement well and this is the only enzymatic debridement product known to this clinician, we will continue with santyl until at least 85% granulated. To note, patient had pitting edema throughout right lower leg and mild warmth in his right lower leg with none of this present on his left leg. With the increased pain in wound bed today, concerned there is a possible infection. Called primary care office but no reply as they are likely already closed for the day. Left voicemail about concern and faxed order for wound culture order to obtain next session. Educated patient and wife in daily temperature readings and what to do if they suspect an infection.    Wound Therapy -  Functional Problem List gait and balance    Factors Delaying/Impairing Wound Healing Diabetes Mellitus;Vascular compromise;Altered sensation    Wound Therapy - Frequency 2X / week    Wound Plan continue with wound care    Dressing  Superior: santyl, 2x2,  hydrogel,  Dressing Primary wound: hydrogel, xeroform, 4x4, kerlix                       PT Short Term Goals - 02/25/21 1502       PT SHORT TERM GOAL #1   Title Patient will have at least 50% granualtion tissue.    Time 3    Period Weeks    Status Achieved    Target Date 12/25/20               PT Long Term Goals - 01/03/21 1344       PT LONG TERM GOAL #1   Title Patient's wound will be healed to reduce the risk of infection.    Time 6    Period Weeks    Status On-going      PT LONG TERM GOAL #2   Title Patient will be able to verbalize completion of daily skin checks to reduce the risk of further wound development.    Time 6    Period Weeks    Status On-going                   Plan - 05/10/21 1539     Clinical Impression Statement see above    Personal Factors and Comorbidities Age;Comorbidity 3+;Past/Current Experience;Fitness;Time since onset of injury/illness/exacerbation    Comorbidities DM, CKD, neuorpathy, Hx CVA    Examination-Activity Limitations Locomotion Level;Transfers;Squat;Stand;Dressing    Examination-Participation Restrictions Community Activity    Stability/Clinical Decision Making Stable/Uncomplicated    Rehab Potential Good    PT Frequency 3x / week    PT Duration 6 weeks   increased time secondary to slow healing wound   PT Treatment/Interventions Gait training;Stair training;Functional mobility training;DME Instruction;Therapeutic activities;Therapeutic exercise;Balance training;Neuromuscular re-education;Patient/family education;Compression bandaging;Manual lymph drainage;Manual techniques    PT Next Visit Plan Continue with both chemical and mechanical debridement  to remove adherent eschar to allow healing to occure    Consulted and Agree with Plan of Care Patient;Family member/caregiver    Family Member Consulted wife             Patient will benefit from skilled therapeutic intervention in order to improve the following deficits and impairments:  Abnormal gait, Decreased skin integrity, Decreased mobility, Impaired sensation  Visit Diagnosis: Ulcer of right pretibial region, limited to breakdown of skin (HCC)  Other abnormalities of gait and mobility     Problem List Patient Active Problem List   Diagnosis Date Noted   Advanced nonexudative age-related macular degeneration of right eye without subfoveal involvement 11/15/2020   Advanced nonexudative age-related macular degeneration of left eye without subfoveal involvement 11/15/2020   Diabetes mellitus without complication ([place]) 35/00/9381   Posterior vitreous detachment of both eyes 11/15/2020   Gait disturbance, post-stroke 11/02/2017   Aphasia as late effect of cerebrovascular accident 11/02/2017   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   Aphasia    PAF (paroxysmal atrial fibrillation) ([place])    Diabetes mellitus type 2 in obese ([place])    Benign essential HTN    Hypothyroidism    Hyperlipidemia    Stroke (cerebrum) ([place]) 10/02/2017   Afib ([place]) 01/10/2017   SOB (shortness of breath) 82/99/3716   Acute diastolic CHF (congestive heart failure) ([place]) 01/10/2017   Atrial fibrillation with RVR ([place]) 01/10/2017   Claudication ([place]) 07/25/2016   Paroxysmal atrial fibrillation ([place]) 07/25/2016   Odynophagia 04/10/2014   Allergic rhinitis 03/23/2014   Chronic rhinitis 12/22/2013   Intrinsic asthma  07/15/2013   Constipation 08/22/2011   Obesity 09/20/2009   G E R D 11/29/2007   Celiac disease 11/29/2007   3:54 PM, 05/10/21 Jerene Pitch, DPT Physical Therapy with [facility]  [phone] office   [place] [address] [place], [place], [address] Phone: [phone]   Fax:  [phone]  Name: [name] MRN: [id number] Date of Birth: 11-28-1928

## 2021-05-12 DIAGNOSIS — E7800 Pure hypercholesterolemia, unspecified: Secondary | ICD-10-CM | POA: Diagnosis not present

## 2021-05-12 DIAGNOSIS — I10 Essential (primary) hypertension: Secondary | ICD-10-CM | POA: Diagnosis not present

## 2021-05-14 ENCOUNTER — Ambulatory Visit (HOSPITAL_COMMUNITY): Payer: PPO | Attending: Internal Medicine | Admitting: Physical Therapy

## 2021-05-14 ENCOUNTER — Other Ambulatory Visit: Payer: Self-pay

## 2021-05-14 ENCOUNTER — Encounter (HOSPITAL_COMMUNITY)
Admission: RE | Admit: 2021-05-14 | Discharge: 2021-05-14 | Disposition: A | Discharge status: Home / Self Care | Payer: PPO | Source: Skilled Nursing Facility | Attending: Internal Medicine | Admitting: Internal Medicine

## 2021-05-14 DIAGNOSIS — L97811 Non-pressure chronic ulcer of other part of right lower leg limited to breakdown of skin: Secondary | ICD-10-CM | POA: Diagnosis not present

## 2021-05-14 DIAGNOSIS — R2689 Other abnormalities of gait and mobility: Secondary | ICD-10-CM | POA: Insufficient documentation

## 2021-05-14 DIAGNOSIS — E11622 Type 2 diabetes mellitus with other skin ulcer: Secondary | ICD-10-CM | POA: Insufficient documentation

## 2021-05-15 ENCOUNTER — Encounter (HOSPITAL_COMMUNITY): Payer: Self-pay | Admitting: Physical Therapy

## 2021-05-15 NOTE — Therapy (Signed)
[place] [address] [place], [place], [address] Phone: [phone]   Fax:  [phone]  Wound Care Therapy and Progress note  Patient Details  Name: [name] MRN: [id number] Date of Birth: 1929/01/24 Referring Provider (PT): [name]   Progress Note Reporting Period 6/28/22to 05/14/21  See note below for Objective Data and Assessment of Progress/Goals.      Encounter Date: 05/14/2021   PT End of Session - 05/15/21 0715     Visit Number 77    Number of Visits 31    Date for PT Re-Evaluation 07/10/21    Authorization Type [employer] (no auth req, no visit limit)    Progress Note Due on Visit 30    PT Start Time 1400    PT Stop Time 1445    PT Time Calculation (min) 45 min    Activity Tolerance Patient tolerated treatment well    Behavior During Therapy WFL for tasks assessed/performed             Past Medical History:  Diagnosis Date   Allergic rhinitis    Asthmatic bronchitis    Atrial fibrillation (HCC)    Celiac disease    Cellulitis    CKD (chronic kidney disease)    Diabetes ([place])    Diverticulosis    Fx. left wrist 1987   Gastric polyps    GERD (gastroesophageal reflux disease)    History of pneumonia    Hypertension    Hypothyroidism    Iron deficiency anemia    Melanoma (HCC)    Neuropathy    PAF (paroxysmal atrial fibrillation) (HCC)    Prostate cancer (HCC)    REM sleep behavior disorder    RLS (restless legs syndrome)    Sleep apnea    Spasticity    Stroke ([place]) 09/2017   Tremor    Type 2 diabetes mellitus ([place])     Past Surgical History:  Procedure Laterality Date   APPENDECTOMY  1978   CARPAL TUNNEL RELEASE Left    CATARACT EXTRACTION  03/2011   bilateral    CHOLECYSTECTOMY  2001   COLONOSCOPY W/ BIOPSIES  04/27/2008   diverticulosis, duodenitis   FOOT SURGERY     HERNIA REPAIR  1994   bilateral   KNEE SURGERY Bilateral    x 2   LUNG SURGERY  2001   for  infection   PROSTATECTOMY  2002   UPPER GASTROINTESTINAL ENDOSCOPY  04/27/2008   celiac disease, gastric polyps    There were no vitals filed for this visit.      [facility] PT Assessment - 05/15/21 0001       Assessment   Medical Diagnosis Chronic venous stasis wound    Referring Provider (PT) [name]    Onset Date/Surgical Date 09/06/20                     Wound Therapy - 05/15/21 0001     Subjective Reports no pain since last session. States rash alogn the top seems worse    Patient and Family Stated Goals wound to heal    Date of Onset 09/06/20    Prior Treatments MD and self care    Pain Scale Faces    Faces Pain Scale Hurts little more   with debridement   Pain Type Acute pain    Pain Location Leg    Pain Orientation Right    Evaluation and Treatment Procedures Explained to Patient/Family  Yes    Evaluation and Treatment Procedures agreed to    Wound Properties Date First Assessed: 04/26/21 Time First Assessed: 4827 Wound Type: Non-pressure wound Location: Leg Location Orientation: Right Wound Description (Comments): superior to original wound Present on Admission: No   Wound Image Images linked: 1    Dressing Type --   santyl, hydrogel   Dressing Changed Changed    Dressing Status Old drainage    Dressing Change Frequency PRN    Site / Wound Assessment Dusky;Pink    % Wound base Red or Granulating 15%    % Wound base Yellow/Fibrinous Exudate 85%    Peri-wound Assessment Edema;Erythema (blanchable)    Margins Unattached edges (unapproximated)    Drainage Amount Scant    Drainage Description Serosanguineous    Treatment Cleansed;Debridement (Selective)    Wound Properties Date First Assessed: 12/04/20 Time First Assessed: 0786 Wound Type: Venous stasis ulcer Location: Leg Location Orientation: Right Present on Admission: Yes   Wound Image Images linked: 1    Dressing Type Hydrogel;Impregnated gauze (bismuth)    Dressing Changed Changed    Dressing Status  Old drainage    Dressing Change Frequency PRN    Site / Wound Assessment Granulation tissue    % Wound base Red or Granulating 95%    % Wound base Yellow/Fibrinous Exudate 5%    Peri-wound Assessment Edema;Erythema (blanchable)    Margins Attached edges (approximated)    Drainage Amount Scant    Drainage Description Serosanguineous    Treatment Cleansed;Debridement (Selective)    Selective Debridement - Location wound beds    Selective Debridement - Tools Used Scalpel;Forceps    Selective Debridement - Tissue Removed slough    Wound Therapy - Clinical Statement Rash along front of shin irritated on this date and it looks like JAS brace has been rubbing on this area and irritating it. Inferior wound continues to approximate. Superior wound still covering in slough and painful to debride. Continued with santyl to upper wound and xeroform along lower wound. Wrapped with kerlix and then put protective lymph netting to reduce irritation along front of shin. Wrapped with light coban and folded lymph netting back down over coban. Wound culture taken of both wounds to rule out infection. Extending POC secondary to conitnued need for wound care. Will follow up with culture to determine if wounds infected. No new goals met at this time. Will continue to converse with MD about current presenation of patient.    Wound Therapy - Functional Problem List gait and balance    Factors Delaying/Impairing Wound Healing Diabetes Mellitus;Vascular compromise;Altered sensation    Wound Therapy - Frequency 2X / week    Wound Plan continue with wound care    Dressing  Superior: santyl, 2x2,  hydrogel,    Dressing Primary wound: hydrogel, xeroform, 4x4, kerlix, then lymph protective netting and light coban                       PT Short Term Goals - 02/25/21 1502       PT SHORT TERM GOAL #1   Title Patient will have at least 50% granualtion tissue.    Time 3    Period Weeks    Status Achieved     Target Date 12/25/20               PT Long Term Goals - 01/03/21 1344       PT LONG TERM GOAL #1   Title Patient's wound  will be healed to reduce the risk of infection.    Time 6    Period Weeks    Status On-going      PT LONG TERM GOAL #2   Title Patient will be able to verbalize completion of daily skin checks to reduce the risk of further wound development.    Time 6    Period Weeks    Status On-going                   Plan - 05/15/21 1104     Clinical Impression Statement see above    Personal Factors and Comorbidities Age;Comorbidity 3+;Past/Current Experience;Fitness;Time since onset of injury/illness/exacerbation    Comorbidities DM, CKD, neuorpathy, Hx CVA    Examination-Activity Limitations Locomotion Level;Transfers;Squat;Stand;Dressing    Examination-Participation Restrictions Community Activity    Stability/Clinical Decision Making Stable/Uncomplicated    Rehab Potential Good    PT Frequency 2x / week    PT Duration 8 weeks   increased time secondary to slow healing wound   PT Treatment/Interventions Gait training;Stair training;Functional mobility training;DME Instruction;Therapeutic activities;Therapeutic exercise;Balance training;Neuromuscular re-education;Patient/family education;Compression bandaging;Manual lymph drainage;Manual techniques    PT Next Visit Plan Continue with both chemical and mechanical debridement to remove adherent eschar to allow healing to occure    Consulted and Agree with Plan of Care Patient;Family member/caregiver    Family Member Consulted wife             Patient will benefit from skilled therapeutic intervention in order to improve the following deficits and impairments:  Abnormal gait, Decreased skin integrity, Decreased mobility, Impaired sensation  Visit Diagnosis: Ulcer of right pretibial region, limited to breakdown of skin (HCC)  Other abnormalities of gait and mobility     Problem List Patient  Active Problem List   Diagnosis Date Noted   Advanced nonexudative age-related macular degeneration of right eye without subfoveal involvement 11/15/2020   Advanced nonexudative age-related macular degeneration of left eye without subfoveal involvement 11/15/2020   Diabetes mellitus without complication ([place]) 16/60/6301   Posterior vitreous detachment of both eyes 11/15/2020   Gait disturbance, post-stroke 11/02/2017   Aphasia as late effect of cerebrovascular accident 11/02/2017   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   Aphasia    PAF (paroxysmal atrial fibrillation) ([place])    Diabetes mellitus type 2 in obese ([place])    Benign essential HTN    Hypothyroidism    Hyperlipidemia    Stroke (cerebrum) ([place]) 10/02/2017   Afib ([place]) 01/10/2017   SOB (shortness of breath) 60/07/9322   Acute diastolic CHF (congestive heart failure) ([place]) 01/10/2017   Atrial fibrillation with RVR ([place]) 01/10/2017   Claudication ([place]) 07/25/2016   Paroxysmal atrial fibrillation ([place]) 07/25/2016   Odynophagia 04/10/2014   Allergic rhinitis 03/23/2014   Chronic rhinitis 12/22/2013   Intrinsic asthma 07/15/2013   Constipation 08/22/2011   Obesity 09/20/2009   G E R D 11/29/2007   Celiac disease 11/29/2007    11:06 AM, 05/15/21 Jerene Pitch, DPT Physical Therapy with [facility]  [phone] office   [place] [address] [place], [place], [address] Phone: [phone]   Fax:  [phone]  Name: [name] MRN: [id number] Date of Birth: 1928/11/27

## 2021-05-17 ENCOUNTER — Ambulatory Visit (HOSPITAL_COMMUNITY): Payer: PPO

## 2021-05-17 ENCOUNTER — Encounter (HOSPITAL_COMMUNITY): Payer: Self-pay

## 2021-05-17 ENCOUNTER — Other Ambulatory Visit: Payer: Self-pay

## 2021-05-17 DIAGNOSIS — R2689 Other abnormalities of gait and mobility: Secondary | ICD-10-CM

## 2021-05-17 DIAGNOSIS — L97811 Non-pressure chronic ulcer of other part of right lower leg limited to breakdown of skin: Secondary | ICD-10-CM | POA: Diagnosis not present

## 2021-05-17 LAB — AEROBIC CULTURE W GRAM STAIN (SUPERFICIAL SPECIMEN): Gram Stain: NONE SEEN

## 2021-05-17 NOTE — Therapy (Signed)
[place], [place], [address] Phone: [phone]   Fax:  [phone]  Wound Care Therapy  Patient Details  Name: [name] MRN: [id number] Date of Birth: Feb 27, 1929 Referring Provider (PT): [name]   Encounter Date: 05/17/2021   PT End of Session - 05/17/21 1519     Visit Number 30    Number of Visits 106    Date for PT Re-Evaluation 07/10/21    Authorization Type [employer] (no auth req, no visit limit)    Progress Note Due on Visit 12    PT Start Time 1404    PT Stop Time 1442    PT Time Calculation (min) 38 min    Activity Tolerance Patient tolerated treatment well    Behavior During Therapy [facility] for tasks assessed/performed             Past Medical History:  Diagnosis Date   Allergic rhinitis    Asthmatic bronchitis    Atrial fibrillation ([place])    Celiac disease    Cellulitis    CKD (chronic kidney disease)    Diabetes ([place])    Diverticulosis    Fx. left wrist 1987   Gastric polyps    GERD (gastroesophageal reflux disease)    History of pneumonia    Hypertension    Hypothyroidism    Iron deficiency anemia    Melanoma ([place])    Neuropathy    PAF (paroxysmal atrial fibrillation) (HCC)    Prostate cancer ([place])    REM sleep behavior disorder    RLS (restless legs syndrome)    Sleep apnea    Spasticity    Stroke ([place]) 09/2017   Tremor    Type 2 diabetes mellitus ([place])     Past Surgical History:  Procedure Laterality Date   APPENDECTOMY  1978   CARPAL TUNNEL RELEASE Left    CATARACT EXTRACTION  03/2011   bilateral    CHOLECYSTECTOMY  2001   COLONOSCOPY W/ BIOPSIES  04/27/2008   diverticulosis, duodenitis   FOOT SURGERY     HERNIA REPAIR  1994   bilateral   KNEE SURGERY Bilateral    x 2   LUNG SURGERY  2001   for infection   PROSTATECTOMY  2002   UPPER GASTROINTESTINAL ENDOSCOPY  04/27/2008   celiac disease, gastric polyps    There were no vitals filed for this  visit.    Subjective Assessment - 05/17/21 1439     Subjective Arrived with dressings intact, no reoprts of pain today.  Has not received call from MD yet regarding antibiotics.    Currently in Pain? No/denies                       Wound Therapy - 05/17/21 0001     Subjective Arrived with dressings intact, no reoprts of pain today.  Has not received call from MD yet regarding antibiotics.    Patient and Family Stated Goals wound to heal    Date of Onset 09/06/20    Prior Treatments MD and self care    Pain Scale 0-10    Pain Score 0-No pain    Evaluation and Treatment Procedures Explained to Patient/Family Yes    Evaluation and Treatment Procedures agreed to    Wound Properties Date First Assessed: 04/26/21 Time First Assessed: 2229 Wound Type: Non-pressure wound Location: Leg Location Orientation: Right Wound Description (Comments): superior to original wound Present on Admission: No  Wound Image Images linked: 1    Dressing Type Hydrogel   Santyl, hydrogel   Dressing Changed Changed    Dressing Status Old drainage    Dressing Change Frequency PRN    Site / Wound Assessment Dusky;Pink    % Wound base Red or Granulating 15%    % Wound base Yellow/Fibrinous Exudate 85%    Peri-wound Assessment Edema;Erythema (blanchable)    Margins Unattached edges (unapproximated)    Drainage Amount Scant    Drainage Description Serosanguineous    Treatment Cleansed;Debridement (Selective)    Wound Properties Date First Assessed: 12/04/20 Time First Assessed: 7619 Wound Type: Venous stasis ulcer Location: Leg Location Orientation: Right Present on Admission: Yes   Wound Image Images linked: 1    Dressing Type Hydrogel;Impregnated gauze (bismuth)    Dressing Changed Changed    Dressing Status Old drainage    Dressing Change Frequency PRN    Site / Wound Assessment Granulation tissue    % Wound base Red or Granulating 95%    % Wound base Yellow/Fibrinous Exudate 5%    Peri-wound  Assessment Edema;Erythema (blanchable)    Wound Length (cm) 2 cm    Wound Width (cm) 1.4 cm    Wound Depth (cm) 0.2 cm    Wound Volume (cm^3) 0.56 cm^3    Wound Surface Area (cm^2) 2.8 cm^2    Margins Attached edges (approximated)    Drainage Amount Scant    Drainage Description Serosanguineous    Treatment Cleansed;Debridement (Selective)    Selective Debridement - Location wound beds    Selective Debridement - Tools Used Scalpel;Forceps    Selective Debridement - Tissue Removed slough    Wound Therapy - Clinical Statement Called MD's office earlier today regarding recent culture results.  Pt reports he has not received call, encouraged to contact MD regarding possibly beginning antibiotics.  Rash and overall redness present anterior LE.  Primary wound is approximating well, superior wound continues to have full eschar which is able to remove easily with scalpel though adherent layers remain so unsure of depth.  Continued wiht xeroform primary wound and santyl/hydrogel on superior wound.    Wound Therapy - Functional Problem List gait and balance    Factors Delaying/Impairing Wound Healing Diabetes Mellitus;Vascular compromise;Altered sensation    Wound Therapy - Frequency 2X / week    Wound Plan continue with wound care    Dressing  Superior: santyl, 2x2,  hydrogel,    Dressing Primary wound: hydrogel, xeroform, 4x4, kerlix, then lymph protective netting and light coban                       PT Short Term Goals - 02/25/21 1502       PT SHORT TERM GOAL #1   Title Patient will have at least 50% granualtion tissue.    Time 3    Period Weeks    Status Achieved    Target Date 12/25/20               PT Long Term Goals - 01/03/21 1344       PT LONG TERM GOAL #1   Title Patient's wound will be healed to reduce the risk of infection.    Time 6    Period Weeks    Status On-going      PT LONG TERM GOAL #2   Title Patient will be able to verbalize completion of  daily skin checks to reduce the risk of further wound development.  Time 6    Period Weeks    Status On-going                    Patient will benefit from skilled therapeutic intervention in order to improve the following deficits and impairments:     Visit Diagnosis: Other abnormalities of gait and mobility  Ulcer of right pretibial region, limited to breakdown of skin [facility])     Problem List Patient Active Problem List   Diagnosis Date Noted   Advanced nonexudative age-related macular degeneration of right eye without subfoveal involvement 11/15/2020   Advanced nonexudative age-related macular degeneration of left eye without subfoveal involvement 11/15/2020   Diabetes mellitus without complication ([place]) 55/97/4163   Posterior vitreous detachment of both eyes 11/15/2020   Gait disturbance, post-stroke 11/02/2017   Aphasia as late effect of cerebrovascular accident 11/02/2017   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   Aphasia    PAF (paroxysmal atrial fibrillation) ([place])    Diabetes mellitus type 2 in obese ([place])    Benign essential HTN    Hypothyroidism    Hyperlipidemia    Stroke (cerebrum) ([place]) 10/02/2017   Afib ([place]) 01/10/2017   SOB (shortness of breath) 84/53/6468   Acute diastolic CHF (congestive heart failure) ([place]) 01/10/2017   Atrial fibrillation with RVR ([place]) 01/10/2017   Claudication ([place]) 07/25/2016   Paroxysmal atrial fibrillation ([place]) 07/25/2016   Odynophagia 04/10/2014   Allergic rhinitis 03/23/2014   Chronic rhinitis 12/22/2013   Intrinsic asthma 07/15/2013   Constipation 08/22/2011   Obesity 09/20/2009   G E R D 11/29/2007   Celiac disease 11/29/2007   Ihor Austin, LPTA/CLT; CBIS [phone]  Aldona Lento 05/17/2021, 3:25 PM  [place] [address] [place], [place], [address] Phone: [phone]   Fax:  [phone]  Name: SILVESTRE MINES MRN:  [id number] Date of Birth: 1929/09/14

## 2021-05-20 DIAGNOSIS — E119 Type 2 diabetes mellitus without complications: Secondary | ICD-10-CM | POA: Diagnosis not present

## 2021-05-21 ENCOUNTER — Other Ambulatory Visit: Payer: Self-pay

## 2021-05-21 ENCOUNTER — Ambulatory Visit (HOSPITAL_COMMUNITY): Payer: PPO | Admitting: Physical Therapy

## 2021-05-21 DIAGNOSIS — L97811 Non-pressure chronic ulcer of other part of right lower leg limited to breakdown of skin: Secondary | ICD-10-CM | POA: Diagnosis not present

## 2021-05-21 DIAGNOSIS — R2689 Other abnormalities of gait and mobility: Secondary | ICD-10-CM

## 2021-05-21 NOTE — Therapy (Signed)
[place], [place], [address] Phone: [phone]   Fax:  [phone]  Wound Care Therapy  Patient Details  Name: [name] MRN: [id number] Date of Birth: January 22, 1929 Referring Provider (PT): [name]   Encounter Date: 05/21/2021    Past Medical History:  Diagnosis Date   Allergic rhinitis    Asthmatic bronchitis    Atrial fibrillation ([place])    Celiac disease    Cellulitis    CKD (chronic kidney disease)    Diabetes ([place])    Diverticulosis    Fx. left wrist 1987   Gastric polyps    GERD (gastroesophageal reflux disease)    History of pneumonia    Hypertension    Hypothyroidism    Iron deficiency anemia    Melanoma ([place])    Neuropathy    PAF (paroxysmal atrial fibrillation) ([employer])    Prostate cancer ([place])    REM sleep behavior disorder    RLS (restless legs syndrome)    Sleep apnea    Spasticity    Stroke ([place]) 09/2017   Tremor    Type 2 diabetes mellitus ([place])     Past Surgical History:  Procedure Laterality Date   APPENDECTOMY  1978   CARPAL TUNNEL RELEASE Left    CATARACT EXTRACTION  03/2011   bilateral    CHOLECYSTECTOMY  2001   COLONOSCOPY W/ BIOPSIES  04/27/2008   diverticulosis, duodenitis   FOOT SURGERY     HERNIA REPAIR  1994   bilateral   KNEE SURGERY Bilateral    x 2   LUNG SURGERY  2001   for infection   PROSTATECTOMY  2002   UPPER GASTROINTESTINAL ENDOSCOPY  04/27/2008   celiac disease, gastric polyps    There were no vitals filed for this visit.               Wound Therapy - 05/21/21 1655     Subjective Dressings intact.  Wife reports MD called in antibiotic this morning and they will pick it up shortly.  Had staph bacteria    Patient and Family Stated Goals wound to heal    Date of Onset 09/06/20    Prior Treatments MD and self care    Pain Scale 0-10    Pain Score 0-No pain    Evaluation and Treatment Procedures Explained to Patient/Family Yes     Evaluation and Treatment Procedures agreed to    Wound Properties Date First Assessed: 04/26/21 Time First Assessed: 0350 Wound Type: Non-pressure wound Location: Leg Location Orientation: Right Wound Description (Comments): superior to original wound Present on Admission: No   Wound Image Images linked: 1    Dressing Type Santyl    Dressing Changed Changed    Dressing Status Old drainage    Dressing Change Frequency PRN    Site / Wound Assessment Yellow;Pink    % Wound base Red or Granulating 15%    % Wound base Yellow/Fibrinous Exudate 85%    Peri-wound Assessment Erythema (blanchable)    Wound Length (cm) 0.8 cm    Wound Width (cm) 1.5 cm    Wound Depth (cm) 0.5 cm    Wound Volume (cm^3) 0.6 cm^3    Wound Surface Area (cm^2) 1.2 cm^2    Margins Unattached edges (unapproximated)    Drainage Amount Scant    Drainage Description Serous    Treatment Cleansed;Debridement (Selective)    Wound Properties Date First Assessed: 12/04/20 Time First Assessed: 1545 Wound Type:  Venous stasis ulcer Location: Leg Location Orientation: Right Present on Admission: Yes   Wound Image Images linked: 1    Dressing Type Santyl    Dressing Changed Changed    Dressing Status Old drainage    Dressing Change Frequency PRN    Site / Wound Assessment Granulation tissue    % Wound base Red or Granulating 95%    % Wound base Yellow/Fibrinous Exudate 5%    Peri-wound Assessment Erythema (blanchable)    Wound Length (cm) 1.9 cm    Wound Width (cm) 1.4 cm    Wound Depth (cm) 0.2 cm    Wound Volume (cm^3) 0.53 cm^3    Wound Surface Area (cm^2) 2.66 cm^2    Margins Attached edges (approximated)    Drainage Amount Scant    Drainage Description Serosanguineous    Treatment Cleansed;Debridement (Selective)    Selective Debridement - Location wound beds    Selective Debridement - Tools Used Scalpel;Forceps    Selective Debridement - Tissue Removed slough    Wound Therapy - Clinical Statement Rash gone superior  LE, however continued redness/rawness of tissue beneath mid anterior LE.  cleansed LE well with sterile gauze and water and debrided both wounds.  Continued approximation of most distal wound, however superior wound has not changed in size or reduced in slough.  Moisturized LE well with vaseline and applied xeroform to anterior LE prior to kerlix, lymph netting and coban.  Santyl in both lateral wounds.    Wound Therapy - Functional Problem List gait and balance    Factors Delaying/Impairing Wound Healing Diabetes Mellitus;Vascular compromise;Altered sensation    Wound Therapy - Frequency 2X / week    Wound Plan continue with wound care    Dressing  both lateral wounds:  santyl, 2x2,  hydrogel,    Dressing --                       PT Short Term Goals - 02/25/21 1502       PT SHORT TERM GOAL #1   Title Patient will have at least 50% granualtion tissue.    Time 3    Period Weeks    Status Achieved    Target Date 12/25/20               PT Long Term Goals - 01/03/21 1344       PT LONG TERM GOAL #1   Title Patient's wound will be healed to reduce the risk of infection.    Time 6    Period Weeks    Status On-going      PT LONG TERM GOAL #2   Title Patient will be able to verbalize completion of daily skin checks to reduce the risk of further wound development.    Time 6    Period Weeks    Status On-going                    Patient will benefit from skilled therapeutic intervention in order to improve the following deficits and impairments:     Visit Diagnosis: Other abnormalities of gait and mobility  Ulcer of right pretibial region, limited to breakdown of skin [facility])     Problem List Patient Active Problem List   Diagnosis Date Noted   Advanced nonexudative age-related macular degeneration of right eye without subfoveal involvement 11/15/2020   Advanced nonexudative age-related macular degeneration of left eye without subfoveal involvement  11/15/2020   Diabetes mellitus without  complication ([place]) 60/45/4098   Posterior vitreous detachment of both eyes 11/15/2020   Gait disturbance, post-stroke 11/02/2017   Aphasia as late effect of cerebrovascular accident 11/02/2017   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   Aphasia    PAF (paroxysmal atrial fibrillation) ([place])    Diabetes mellitus type 2 in obese ([place])    Benign essential HTN    Hypothyroidism    Hyperlipidemia    Stroke (cerebrum) ([place]) 10/02/2017   Afib ([place]) 01/10/2017   SOB (shortness of breath) 11/91/4782   Acute diastolic CHF (congestive heart failure) ([place]) 01/10/2017   Atrial fibrillation with RVR ([place]) 01/10/2017   Claudication ([place]) 07/25/2016   Paroxysmal atrial fibrillation ([place]) 07/25/2016   Odynophagia 04/10/2014   Allergic rhinitis 03/23/2014   Chronic rhinitis 12/22/2013   Intrinsic asthma 07/15/2013   Constipation 08/22/2011   Obesity 09/20/2009   G E R D 11/29/2007   Celiac disease 11/29/2007   Teena Irani, PTA/CLT [phone]  Teena Irani 05/21/2021, 5:20 PM  [place] [address] [place], [place], [address] Phone: [phone]   Fax:  [phone]  Name: [name] MRN: [id number] Date of Birth: [birth year]

## 2021-05-24 ENCOUNTER — Other Ambulatory Visit: Payer: Self-pay

## 2021-05-24 ENCOUNTER — Encounter (HOSPITAL_COMMUNITY): Payer: Self-pay | Admitting: Physical Therapy

## 2021-05-24 ENCOUNTER — Ambulatory Visit (HOSPITAL_COMMUNITY): Payer: PPO | Admitting: Physical Therapy

## 2021-05-24 DIAGNOSIS — R2689 Other abnormalities of gait and mobility: Secondary | ICD-10-CM

## 2021-05-24 DIAGNOSIS — L97811 Non-pressure chronic ulcer of other part of right lower leg limited to breakdown of skin: Secondary | ICD-10-CM | POA: Diagnosis not present

## 2021-05-24 NOTE — Therapy (Signed)
[place], [place], [address] Phone: [phone]   Fax:  [phone]  Wound Care Therapy  Patient Details  Name: [name] MRN: [id number] Date of Birth: [birth year] Referring Provider (PT): [name]   Encounter Date: 05/24/2021   PT End of Session - 05/24/21 1544     Visit Number 54    Number of Visits 68    Date for PT Re-Evaluation 07/10/21    Authorization Type [employer] (no auth req, no visit limit)    Progress Note Due on Visit 56    PT Start Time 1450    PT Stop Time 1525    PT Time Calculation (min) 35 min    Activity Tolerance Patient tolerated treatment well    Behavior During Therapy [facility] for tasks assessed/performed             Past Medical History:  Diagnosis Date   Allergic rhinitis    Asthmatic bronchitis    Atrial fibrillation ([place])    Celiac disease    Cellulitis    CKD (chronic kidney disease)    Diabetes ([place])    Diverticulosis    Fx. left wrist 1987   Gastric polyps    GERD (gastroesophageal reflux disease)    History of pneumonia    Hypertension    Hypothyroidism    Iron deficiency anemia    Melanoma ([place])    Neuropathy    PAF (paroxysmal atrial fibrillation) (HCC)    Prostate cancer ([place])    REM sleep behavior disorder    RLS (restless legs syndrome)    Sleep apnea    Spasticity    Stroke ([place]) 09/2017   Tremor    Type 2 diabetes mellitus ([place])     Past Surgical History:  Procedure Laterality Date   APPENDECTOMY  1978   CARPAL TUNNEL RELEASE Left    CATARACT EXTRACTION  03/2011   bilateral    CHOLECYSTECTOMY  2001   COLONOSCOPY W/ BIOPSIES  04/27/2008   diverticulosis, duodenitis   FOOT SURGERY     HERNIA REPAIR  1994   bilateral   KNEE SURGERY Bilateral    x 2   LUNG SURGERY  2001   for infection   PROSTATECTOMY  2002   UPPER GASTROINTESTINAL ENDOSCOPY  04/27/2008   celiac disease, gastric polyps    There were no vitals filed for  this visit.               Wound Therapy - 05/24/21 0001     Subjective Dressings intact.  Wife reports MD called in antibiotic this morning and they will pick it up shortly.  Had staph bacteria    Patient and Family Stated Goals wound to heal    Date of Onset 09/06/20    Prior Treatments MD and self care    Pain Scale 0-10    Pain Score 0-No pain    Evaluation and Treatment Procedures Explained to Patient/Family Yes    Evaluation and Treatment Procedures agreed to    Wound Properties Date First Assessed: 04/26/21 Time First Assessed: 9371 Wound Type: Non-pressure wound Location: Leg Location Orientation: Right Wound Description (Comments): superior to original wound Present on Admission: No   Wound Image Images linked: 3    Dressing Type Hydrogel;Santyl    Dressing Changed Changed    Dressing Status Old drainage    Dressing Change Frequency PRN    Site / Wound Assessment Yellow;Pink    %  Wound base Red or Granulating 15%    % Wound base Yellow/Fibrinous Exudate 85%    Peri-wound Assessment Edema;Erythema (blanchable)    Wound Length (cm) 0.8 cm    Wound Width (cm) 1.3 cm    Wound Depth (cm) --   uknown due to slough   Wound Surface Area (cm^2) 1.04 cm^2    Drainage Amount Scant    Drainage Description Serous    Treatment Cleansed;Debridement (Selective)    Wound Properties Date First Assessed: 12/04/20 Time First Assessed: 6301 Wound Type: Venous stasis ulcer Location: Leg Location Orientation: Right Present on Admission: Yes   Wound Image --   see above   Dressing Type Hydrogel;Santyl;Gauze (Comment)    Dressing Changed Changed    Dressing Status Old drainage    Dressing Change Frequency PRN    Site / Wound Assessment Granulation tissue    % Wound base Red or Granulating 95%    % Wound base Yellow/Fibrinous Exudate 5%    Wound Length (cm) 1.7 cm    Wound Width (cm) 1.3 cm    Wound Depth (cm) 0.2 cm    Wound Volume (cm^3) 0.44 cm^3    Wound Surface Area (cm^2)  2.21 cm^2    Drainage Amount Scant    Treatment Cleansed;Debridement (Selective)    Selective Debridement - Location wound beds    Selective Debridement - Tools Used Scalpel    Selective Debridement - Tissue Removed slough    Wound Therapy - Clinical Statement Pt has been on antibiotics since 8/9; on the whole wounds are continuing to approximate but very slowly.  Superior wound continues to have adherent slough that is difficulty to mechanically debride.  Lower, inital wound continues to improve.    Wound Therapy - Functional Problem List gait and balance    Factors Delaying/Impairing Wound Healing Diabetes Mellitus;Vascular compromise;Altered sensation    Wound Therapy - Frequency 2X / week    Wound Plan continue with wound care    Dressing  both lateral wounds:  santyl, 2x2,  hydrogel, 4x4 f/b sleeve used for lymphedema pt and coban.                       PT Short Term Goals - 02/25/21 1502       PT SHORT TERM GOAL #1   Title Patient will have at least 50% granualtion tissue.    Time 3    Period Weeks    Status Achieved    Target Date 12/25/20               PT Long Term Goals - 01/03/21 1344       PT LONG TERM GOAL #1   Title Patient's wound will be healed to reduce the risk of infection.    Time 6    Period Weeks    Status On-going      PT LONG TERM GOAL #2   Title Patient will be able to verbalize completion of daily skin checks to reduce the risk of further wound development.    Time 6    Period Weeks    Status On-going                   Plan - 05/24/21 1544     Clinical Impression Statement see above    Personal Factors and Comorbidities Age;Comorbidity 3+;Past/Current Experience;Fitness;Time since onset of injury/illness/exacerbation    Comorbidities DM, CKD, neuorpathy, Hx CVA    Examination-Activity Limitations Locomotion Level;Transfers;Squat;Stand;Dressing  Examination-Participation Restrictions Community Activity     Stability/Clinical Decision Making Stable/Uncomplicated    Rehab Potential Good    PT Frequency 2x / week    PT Duration 8 weeks   increased time secondary to slow healing wound   PT Treatment/Interventions Gait training;Stair training;Functional mobility training;DME Instruction;Therapeutic activities;Therapeutic exercise;Balance training;Neuromuscular re-education;Patient/family education;Compression bandaging;Manual lymph drainage;Manual techniques    PT Next Visit Plan Continue with both chemical and mechanical debridement to remove adherent eschar to allow healing to occur    Consulted and Agree with Plan of Care Patient;Family member/caregiver    Family Member Consulted wife             Patient will benefit from skilled therapeutic intervention in order to improve the following deficits and impairments:  Abnormal gait, Decreased skin integrity, Decreased mobility, Impaired sensation  Visit Diagnosis: Other abnormalities of gait and mobility  Ulcer of right pretibial region, limited to breakdown of skin [facility])     Problem List Patient Active Problem List   Diagnosis Date Noted   Advanced nonexudative age-related macular degeneration of right eye without subfoveal involvement 11/15/2020   Advanced nonexudative age-related macular degeneration of left eye without subfoveal involvement 11/15/2020   Diabetes mellitus without complication ([place]) 13/05/6577   Posterior vitreous detachment of both eyes 11/15/2020   Gait disturbance, post-stroke 11/02/2017   Aphasia as late effect of cerebrovascular accident 11/02/2017   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   Aphasia    PAF (paroxysmal atrial fibrillation) ([place])    Diabetes mellitus type 2 in obese ([place])    Benign essential HTN    Hypothyroidism    Hyperlipidemia    Stroke (cerebrum) ([place]) 10/02/2017   Afib ([place]) 01/10/2017   SOB (shortness of breath) 46/96/2952   Acute diastolic CHF (congestive heart failure)  ([place]) 01/10/2017   Atrial fibrillation with RVR ([place]) 01/10/2017   Claudication ([place]) 07/25/2016   Paroxysmal atrial fibrillation ([place]) 07/25/2016   Odynophagia 04/10/2014   Allergic rhinitis 03/23/2014   Chronic rhinitis 12/22/2013   Intrinsic asthma 07/15/2013   Constipation 08/22/2011   Obesity 09/20/2009   G E R D 11/29/2007   Celiac disease 11/29/2007   Rayetta Humphrey, PT CLT [phone]  05/24/2021, 3:45 PM  [place] [facility] [address] [place], [place], [address] Phone: [phone]   Fax:  [phone]  Name: [name] MRN: [id number] Date of Birth: 11-Mar-1929

## 2021-05-28 ENCOUNTER — Encounter (HOSPITAL_COMMUNITY): Payer: Self-pay | Admitting: Physical Therapy

## 2021-05-28 ENCOUNTER — Ambulatory Visit (HOSPITAL_COMMUNITY): Payer: PPO | Admitting: Physical Therapy

## 2021-05-28 ENCOUNTER — Other Ambulatory Visit: Payer: Self-pay

## 2021-05-28 DIAGNOSIS — R2689 Other abnormalities of gait and mobility: Secondary | ICD-10-CM

## 2021-05-28 DIAGNOSIS — L97811 Non-pressure chronic ulcer of other part of right lower leg limited to breakdown of skin: Secondary | ICD-10-CM

## 2021-05-28 NOTE — Therapy (Signed)
[place], [place], [address] Phone: [phone]   Fax:  [phone]  Wound Care Therapy  Patient Details  Name: [name] MRN: [id number] Date of Birth: July 08, 1929 Referring Provider (PT): [name]   Encounter Date: 05/28/2021   PT End of Session - 05/28/21 1601     Visit Number 60    Number of Visits 10    Date for PT Re-Evaluation 07/10/21    Authorization Type [employer] (no auth req, no visit limit)    Progress Note Due on Visit 57    PT Start Time 1315    PT Stop Time 4193    PT Time Calculation (min) 43 min    Activity Tolerance Patient tolerated treatment well    Behavior During Therapy [facility] for tasks assessed/performed             Past Medical History:  Diagnosis Date   Allergic rhinitis    Asthmatic bronchitis    Atrial fibrillation ([place])    Celiac disease    Cellulitis    CKD (chronic kidney disease)    Diabetes ([place])    Diverticulosis    Fx. left wrist 1987   Gastric polyps    GERD (gastroesophageal reflux disease)    History of pneumonia    Hypertension    Hypothyroidism    Iron deficiency anemia    Melanoma ([place])    Neuropathy    PAF (paroxysmal atrial fibrillation) (HCC)    Prostate cancer ([place])    REM sleep behavior disorder    RLS (restless legs syndrome)    Sleep apnea    Spasticity    Stroke ([place]) 09/2017   Tremor    Type 2 diabetes mellitus ([place])     Past Surgical History:  Procedure Laterality Date   APPENDECTOMY  1978   CARPAL TUNNEL RELEASE Left    CATARACT EXTRACTION  03/2011   bilateral    CHOLECYSTECTOMY  2001   COLONOSCOPY W/ BIOPSIES  04/27/2008   diverticulosis, duodenitis   FOOT SURGERY     HERNIA REPAIR  1994   bilateral   KNEE SURGERY Bilateral    x 2   LUNG SURGERY  2001   for infection   PROSTATECTOMY  2002   UPPER GASTROINTESTINAL ENDOSCOPY  04/27/2008   celiac disease, gastric polyps    There were no vitals filed for  this visit.      [facility] PT Assessment - 05/28/21 0001       Assessment   Medical Diagnosis Chronic venous stasis wound    Referring Provider (PT) [name]                     Wound Therapy - 05/28/21 0001     Subjective Patient reports he has been having pain along the side of his leg at night though no current pain.    Patient and Family Stated Goals wound to heal    Date of Onset 09/06/20    Prior Treatments MD and self care    Pain Scale 0-10    Pain Score 0-No pain    Evaluation and Treatment Procedures Explained to Patient/Family Yes    Evaluation and Treatment Procedures agreed to    Wound Properties Date First Assessed: 04/26/21 Time First Assessed: 7902 Wound Type: Non-pressure wound Location: Leg Location Orientation: Right Wound Description (Comments): superior to original wound Present on Admission: No   Dressing Type Hydrogel;Santyl  Dressing Changed Changed    Dressing Status Old drainage    Dressing Change Frequency PRN    Site / Wound Assessment Yellow;Pink    % Wound base Red or Granulating 20%    % Wound base Yellow/Fibrinous Exudate 80%    Peri-wound Assessment Edema;Erythema (blanchable)    Margins Unattached edges (unapproximated)    Drainage Amount Scant    Drainage Description Serosanguineous    Treatment Cleansed;Debridement (Selective)    Wound Properties Date First Assessed: 12/04/20 Time First Assessed: 5784 Wound Type: Venous stasis ulcer Location: Leg Location Orientation: Right Present on Admission: Yes   Dressing Type Hydrogel;Santyl    Dressing Changed Changed    Dressing Status Old drainage    Dressing Change Frequency PRN    Site / Wound Assessment Granulation tissue    % Wound base Red or Granulating 95%    % Wound base Yellow/Fibrinous Exudate 5%    Peri-wound Assessment Erythema (blanchable);Edema    Margins Unattached edges (unapproximated)    Drainage Amount Scant    Drainage Description Serosanguineous    Treatment  Cleansed;Debridement (Selective)    Selective Debridement - Location wound beds    Selective Debridement - Tools Used Scalpel    Selective Debridement - Tissue Removed slough    Wound Therapy - Clinical Statement Patient's wounds continue to improve in granulation and size. Continued rash along anterior leg which was covered in Vaseline and xeroform. Redness surrounding superior wound as if it is irritated. Patient still on antibiotics and has at least a week left of current prescription. Continued with debridement and santyl and hydrogel to both wounds. Covered with lymph netting and coban. Overall comfort reported end of session. Will continue with current POC. Noted that diabetic sock ends where superior wound is, instructed patient to take off diabetic sock if wound is painful as this may be pressing on the wound bed site and causing increased pain.    Wound Therapy - Functional Problem List gait and balance    Factors Delaying/Impairing Wound Healing Diabetes Mellitus;Vascular compromise;Altered sensation    Wound Therapy - Frequency 2X / week    Wound Plan continue with wound care    Dressing  both lateral wounds:  santyl, 2x2,  hydrogel, 4x4 f/b sleeve used for lymphedema pt and coban.                       PT Short Term Goals - 02/25/21 1502       PT SHORT TERM GOAL #1   Title Patient will have at least 50% granualtion tissue.    Time 3    Period Weeks    Status Achieved    Target Date 12/25/20               PT Long Term Goals - 01/03/21 1344       PT LONG TERM GOAL #1   Title Patient's wound will be healed to reduce the risk of infection.    Time 6    Period Weeks    Status On-going      PT LONG TERM GOAL #2   Title Patient will be able to verbalize completion of daily skin checks to reduce the risk of further wound development.    Time 6    Period Weeks    Status On-going                    Patient will benefit from skilled therapeutic  intervention  in order to improve the following deficits and impairments:     Visit Diagnosis: Other abnormalities of gait and mobility  Ulcer of right pretibial region, limited to breakdown of skin [facility])     Problem List Patient Active Problem List   Diagnosis Date Noted   Advanced nonexudative age-related macular degeneration of right eye without subfoveal involvement 11/15/2020   Advanced nonexudative age-related macular degeneration of left eye without subfoveal involvement 11/15/2020   Diabetes mellitus without complication ([place]) 24/82/5003   Posterior vitreous detachment of both eyes 11/15/2020   Gait disturbance, post-stroke 11/02/2017   Aphasia as late effect of cerebrovascular accident 11/02/2017   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   Aphasia    PAF (paroxysmal atrial fibrillation) ([place])    Diabetes mellitus type 2 in obese ([place])    Benign essential HTN    Hypothyroidism    Hyperlipidemia    Stroke (cerebrum) ([place]) 10/02/2017   Afib ([place]) 01/10/2017   SOB (shortness of breath) 70/48/8891   Acute diastolic CHF (congestive heart failure) ([place]) 01/10/2017   Atrial fibrillation with RVR ([place]) 01/10/2017   Claudication ([place]) 07/25/2016   Paroxysmal atrial fibrillation ([place]) 07/25/2016   Odynophagia 04/10/2014   Allergic rhinitis 03/23/2014   Chronic rhinitis 12/22/2013   Intrinsic asthma 07/15/2013   Constipation 08/22/2011   Obesity 09/20/2009   G E R D 11/29/2007   Celiac disease 11/29/2007   4:07 PM, 05/28/21 Jerene Pitch, DPT Physical Therapy with [facility]  [phone] office   [place] [address] [place], [place], [address] Phone: [phone]   Fax:  [phone]  Name: [name] MRN: [id number] Date of Birth: 16-Apr-1929

## 2021-05-30 ENCOUNTER — Ambulatory Visit (INDEPENDENT_AMBULATORY_CARE_PROVIDER_SITE_OTHER): Payer: PPO

## 2021-05-30 DIAGNOSIS — I350 Nonrheumatic aortic (valve) stenosis: Secondary | ICD-10-CM

## 2021-05-30 DIAGNOSIS — E119 Type 2 diabetes mellitus without complications: Secondary | ICD-10-CM | POA: Diagnosis not present

## 2021-05-31 ENCOUNTER — Other Ambulatory Visit: Payer: Self-pay

## 2021-05-31 ENCOUNTER — Ambulatory Visit (HOSPITAL_COMMUNITY): Payer: PPO | Admitting: Physical Therapy

## 2021-05-31 DIAGNOSIS — R2689 Other abnormalities of gait and mobility: Secondary | ICD-10-CM

## 2021-05-31 DIAGNOSIS — M1712 Unilateral primary osteoarthritis, left knee: Secondary | ICD-10-CM | POA: Diagnosis not present

## 2021-05-31 DIAGNOSIS — L97811 Non-pressure chronic ulcer of other part of right lower leg limited to breakdown of skin: Secondary | ICD-10-CM

## 2021-05-31 NOTE — Therapy (Signed)
[place], [place], [address] Phone: [phone]   Fax:  [phone]  Wound Care Therapy  Patient Details  Name: [name] MRN: [id number] Date of Birth: 1929/10/02 Referring Provider (PT): [name]   Encounter Date: 05/31/2021   PT End of Session - 05/31/21 1315     Visit Number 76    Number of Visits 43    Date for PT Re-Evaluation 07/10/21    Authorization Type [employer] (no auth req, no visit limit)    Progress Note Due on Visit 85    PT Start Time 1130    PT Stop Time 1205    PT Time Calculation (min) 35 min    Activity Tolerance Patient tolerated treatment well    Behavior During Therapy [facility] for tasks assessed/performed             Past Medical History:  Diagnosis Date   Allergic rhinitis    Asthmatic bronchitis    Atrial fibrillation ([place])    Celiac disease    Cellulitis    CKD (chronic kidney disease)    Diabetes ([place])    Diverticulosis    Fx. left wrist 1987   Gastric polyps    GERD (gastroesophageal reflux disease)    History of pneumonia    Hypertension    Hypothyroidism    Iron deficiency anemia    Melanoma ([place])    Neuropathy    PAF (paroxysmal atrial fibrillation) (HCC)    Prostate cancer ([place])    REM sleep behavior disorder    RLS (restless legs syndrome)    Sleep apnea    Spasticity    Stroke ([place]) 09/2017   Tremor    Type 2 diabetes mellitus ([place])     Past Surgical History:  Procedure Laterality Date   APPENDECTOMY  1978   CARPAL TUNNEL RELEASE Left    CATARACT EXTRACTION  03/2011   bilateral    CHOLECYSTECTOMY  2001   COLONOSCOPY W/ BIOPSIES  04/27/2008   diverticulosis, duodenitis   FOOT SURGERY     HERNIA REPAIR  1994   bilateral   KNEE SURGERY Bilateral    x 2   LUNG SURGERY  2001   for infection   PROSTATECTOMY  2002   UPPER GASTROINTESTINAL ENDOSCOPY  04/27/2008   celiac disease, gastric polyps    There were no vitals filed for  this visit.               Wound Therapy - 05/31/21 0001     Subjective Pt states that his leg does not hurt any longer.    Patient and Family Stated Goals wound to heal    Date of Onset 09/06/20    Prior Treatments MD and self care    Pain Score 0-No pain    Evaluation and Treatment Procedures Explained to Patient/Family Yes    Evaluation and Treatment Procedures agreed to    Wound Properties Date First Assessed: 04/26/21 Time First Assessed: 6712 Wound Type: Non-pressure wound Location: Leg Location Orientation: Right Wound Description (Comments): superior to original wound Present on Admission: No   Dressing Type Hydrogel;Santyl;Gauze (Comment)    Dressing Changed Changed    Dressing Status Old drainage    Dressing Change Frequency PRN    Site / Wound Assessment Yellow;Pink    % Wound base Red or Granulating 20%    % Wound base Yellow/Fibrinous Exudate 80%    Peri-wound Assessment Erythema (blanchable)  Wound Length (cm) 0.9 cm    Wound Width (cm) 1.3 cm    Wound Surface Area (cm^2) 1.17 cm^2    Margins Attached edges (approximated)    Drainage Amount Scant    Drainage Description Serous    Treatment Cleansed;Debridement (Selective)    Wound Properties Date First Assessed: 12/04/20 Time First Assessed: 9417 Wound Type: Venous stasis ulcer Location: Leg Location Orientation: Right Present on Admission: Yes   Dressing Type Hydrogel;Gauze (Comment);Santyl    Dressing Changed Changed    Dressing Status Old drainage    Dressing Change Frequency PRN    Site / Wound Assessment Granulation tissue    % Wound base Red or Granulating 100%   following debridement.   Peri-wound Assessment Erythema (blanchable)    Wound Length (cm) 1.5 cm    Wound Width (cm) 1.3 cm    Wound Depth (cm) 0.2 cm    Wound Volume (cm^3) 0.39 cm^3    Wound Surface Area (cm^2) 1.95 cm^2    Margins Attached edges (approximated)    Drainage Amount Scant    Treatment Cleansed;Debridement (Selective)     Selective Debridement - Location wound beds    Selective Debridement - Tools Used Scalpel    Selective Debridement - Tissue Removed slough    Wound Therapy - Clinical Statement PT superior wound has 6 epitherlial buds showing at this time.  Lower wound has decreased slightly in size.    Wound Therapy - Functional Problem List gait and balance    Factors Delaying/Impairing Wound Healing Diabetes Mellitus;Vascular compromise;Altered sensation    Wound Therapy - Frequency 2X / week    Wound Plan continue with wound care    Dressing  both lateral wounds:  santyl, 2x2,  hydrogel, 4x4 f/b sleeve used for lymphedema pt and coban.                       PT Short Term Goals - 02/25/21 1502       PT SHORT TERM GOAL #1   Title Patient will have at least 50% granualtion tissue.    Time 3    Period Weeks    Status Achieved    Target Date 12/25/20               PT Long Term Goals - 01/03/21 1344       PT LONG TERM GOAL #1   Title Patient's wound will be healed to reduce the risk of infection.    Time 6    Period Weeks    Status On-going      PT LONG TERM GOAL #2   Title Patient will be able to verbalize completion of daily skin checks to reduce the risk of further wound development.    Time 6    Period Weeks    Status On-going                   Plan - 05/31/21 1315     Clinical Impression Statement as above    Personal Factors and Comorbidities Age;Comorbidity 3+;Past/Current Experience;Fitness;Time since onset of injury/illness/exacerbation    Comorbidities DM, CKD, neuorpathy, Hx CVA    Examination-Activity Limitations Locomotion Level;Transfers;Squat;Stand;Dressing    Examination-Participation Restrictions Community Activity    Stability/Clinical Decision Making Stable/Uncomplicated    Rehab Potential Good    PT Frequency 2x / week    PT Duration 8 weeks   increased time secondary to slow healing wound   PT Treatment/Interventions Gait  training;Stair  training;Functional mobility training;DME Instruction;Therapeutic activities;Therapeutic exercise;Balance training;Neuromuscular re-education;Patient/family education;Compression bandaging;Manual lymph drainage;Manual techniques    PT Next Visit Plan Continue with both chemical and mechanical debridement to remove adherent eschar to allow healing to occur    Consulted and Agree with Plan of Care Patient;Family member/caregiver    Family Member Consulted wife             Patient will benefit from skilled therapeutic intervention in order to improve the following deficits and impairments:  Abnormal gait, Decreased skin integrity, Decreased mobility, Impaired sensation  Visit Diagnosis: Other abnormalities of gait and mobility  Ulcer of right pretibial region, limited to breakdown of skin [facility])     Problem List Patient Active Problem List   Diagnosis Date Noted   Advanced nonexudative age-related macular degeneration of right eye without subfoveal involvement 11/15/2020   Advanced nonexudative age-related macular degeneration of left eye without subfoveal involvement 11/15/2020   Diabetes mellitus without complication ([place]) 56/38/7564   Posterior vitreous detachment of both eyes 11/15/2020   Gait disturbance, post-stroke 11/02/2017   Aphasia as late effect of cerebrovascular accident 11/02/2017   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)    Benign essential HTN    Hypothyroidism    Hyperlipidemia    Stroke (cerebrum) ([place]) 10/02/2017   Afib ([place]) 01/10/2017   SOB (shortness of breath) 33/29/5188   Acute diastolic CHF (congestive heart failure) ([place]) 01/10/2017   Atrial fibrillation with RVR ([place]) 01/10/2017   Claudication ([employer]) 07/25/2016   Paroxysmal atrial fibrillation ([place]) 07/25/2016   Odynophagia 04/10/2014   Allergic rhinitis 03/23/2014   Chronic  rhinitis 12/22/2013   Intrinsic asthma 07/15/2013   Constipation 08/22/2011   Obesity 09/20/2009   G E R D 11/29/2007   Celiac disease 11/29/2007   Rayetta Humphrey, PT CLT [phone]  05/31/2021, 1:16 PM  [place], [place], [address] Phone: [phone]   Fax:  [phone]  Name: ARBOR COHEN MRN: [id number] Date of Birth: [birth year]

## 2021-06-03 ENCOUNTER — Encounter: Payer: Self-pay | Admitting: Cardiology

## 2021-06-03 ENCOUNTER — Ambulatory Visit: Payer: PPO | Admitting: Cardiology

## 2021-06-03 ENCOUNTER — Other Ambulatory Visit: Payer: Self-pay

## 2021-06-03 VITALS — BP 130/50 | HR 65 | Ht 70.0 in | Wt 195.2 lb

## 2021-06-03 DIAGNOSIS — I480 Paroxysmal atrial fibrillation: Secondary | ICD-10-CM

## 2021-06-03 DIAGNOSIS — I350 Nonrheumatic aortic (valve) stenosis: Secondary | ICD-10-CM

## 2021-06-03 LAB — ECHOCARDIOGRAM COMPLETE
AR max vel: 1.02 cm2
AV Area VTI: 1.05 cm2
AV Area mean vel: 1.01 cm2
AV Mean grad: 26.5 mmHg
AV Peak grad: 45.7 mmHg
Ao pk vel: 3.38 m/s
Area-P 1/2: 2.84 cm2
Calc EF: 71.6 %
P 1/2 time: 585 msec
S' Lateral: 2.63 cm
Single Plane A2C EF: 72.6 %
Single Plane A4C EF: 68.3 %

## 2021-06-03 NOTE — Progress Notes (Signed)
Cardiology Office Note  Date: 06/03/2021   ID: Prentiss, Polio June 11, 1929, MRN [id number]  PCP:  Celene Squibb, MD  Cardiologist:  Rozann Lesches, MD Electrophysiologist:  None   Chief Complaint  Patient presents with   Cardiac follow-up     History of Present Illness: [name] is a 87 y.o. male last seen in May.  He is here today with his wife for a follow-up visit.  He does not report any exertional chest pain or palpitations, using a walker as before.  Has also been attended to at the wound clinic for leg ulcers, recently completing course of antibiotics.  I reviewed his recent echocardiogram as noted below, overall no substantial change since study in December 2021, although dimensionless index decreased to 0.23 and his aortic stenosis is closer to the severe range.  We discussed this today.  I reviewed his recent lab work as noted below.  He does not report any spontaneous bleeding problems on Eliquis.  We went over his home blood pressure and heart rate, Lopressor was decreased at the last visit.  No dizziness or syncope.  Past Medical History:  Diagnosis Date   Allergic rhinitis    Aortic stenosis    Asthmatic bronchitis    Celiac disease    Cellulitis    CKD (chronic kidney disease)    Diverticulosis    Fx. left wrist 1987   Gastric polyps    GERD (gastroesophageal reflux disease)    History of pneumonia    Hypertension    Hypothyroidism    Iron deficiency anemia    Melanoma (HCC)    Neuropathy    PAF (paroxysmal atrial fibrillation) (HCC)    Prostate cancer ([place])    REM sleep behavior disorder    RLS (restless legs syndrome)    Sleep apnea    Spasticity    Stroke ([place]) 09/2017   Tremor    Type 2 diabetes mellitus ([place])     Past Surgical History:  Procedure Laterality Date   APPENDECTOMY  1978   CARPAL TUNNEL RELEASE Left    CATARACT EXTRACTION  03/2011   bilateral    CHOLECYSTECTOMY  2001   COLONOSCOPY W/ BIOPSIES  04/27/2008    diverticulosis, duodenitis   FOOT SURGERY     HERNIA REPAIR  1994   bilateral   KNEE SURGERY Bilateral    x 2   LUNG SURGERY  2001   for infection   PROSTATECTOMY  2002   UPPER GASTROINTESTINAL ENDOSCOPY  04/27/2008   celiac disease, gastric polyps    Current Outpatient Medications  Medication Sig Dispense Refill   acetaminophen (TYLENOL) 325 MG tablet Take 650 mg by mouth every 6 (six) hours as needed. Takes 2 once daily     amitriptyline (ELAVIL) 50 MG tablet Take 50 mg by mouth at bedtime.     apixaban (ELIQUIS) 2.5 MG TABS tablet Take 1 tablet (2.5 mg total) by mouth 2 (two) times daily. 60 tablet 6   Ascorbic Acid (VITAMIN C) 1000 MG tablet Take 1,000 mg by mouth 2 (two) times daily.     atorvastatin (LIPITOR) 20 MG tablet Take 20 mg by mouth daily.     azelastine (ASTELIN) 0.1 % nasal spray Place 1 spray into both nostrils at bedtime.      calcitRIOL (ROCALTROL) 0.25 MCG capsule Take 0.25 mcg by mouth daily.     Cholecalciferol (VITAMIN D3) 1000 UNITS CAPS Take 1,000 Units by mouth daily.  diclofenac sodium (VOLTAREN) 1 % GEL Apply 2 g topically 4 (four) times daily. 1 Tube 0   empagliflozin (JARDIANCE) 10 MG TABS tablet Take 10 mg by mouth daily.     Fexofenadine HCl (MUCINEX ALLERGY PO) Take 1 tablet by mouth daily.     fluticasone (FLONASE) 50 MCG/ACT nasal spray Place 1 spray into both nostrils daily.      furosemide (LASIX) 20 MG tablet Take 20 mg by mouth. 80mg  in the morning and 60 mg in the afternoon     Ginger, Zingiber officinalis, (GINGER ROOT) 550 MG CAPS Take by mouth. 2 daily     glipiZIDE (GLUCOTROL XL) 10 MG 24 hr tablet 10 mg.     JANUVIA 100 MG tablet 100 mg daily. morning     levocetirizine (XYZAL) 5 MG tablet Take 5 mg by mouth at bedtime.      levothyroxine (SYNTHROID, LEVOTHROID) 150 MCG tablet Take 1 tablet (150 mcg total) by mouth daily before breakfast. 30 tablet 0   MAGNESIUM MALATE PO Take 500 mg by mouth daily.     Melatonin 3 MG TABS Take 3 mg by  mouth at bedtime.     metoprolol tartrate (LOPRESSOR) 25 MG tablet Take 2 tablets (50 mg total) by mouth 2 (two) times daily. 120 tablet 6   Misc Natural Products (TART CHERRY ADVANCED PO) Take 425 mg by mouth. 2 daily     Omega-3 Fatty Acids (FISH OIL) 1000 MG CAPS Take by mouth daily.     pantoprazole (PROTONIX) 40 MG tablet Take 1 tablet (40 mg total) by mouth daily. 30 tablet 0   polyethylene glycol (MIRALAX / GLYCOLAX) 17 g packet Take 17 g by mouth as needed.     tiZANidine (ZANAFLEX) 2 MG tablet      No current facility-administered medications for this visit.   Allergies:  Clindamycin, Gluten meal, Hydrocodone, Penicillins, Procaine, Sulfonamide derivatives, Tape, Wheat bran, Cephalexin, and Mold extract [trichophyton]   ROS: No syncope.  Physical Exam: VS:  BP (!) 130/50   Pulse 65   Ht 5\' 10"  (1.778 m)   Wt 195 lb 3.2 oz (88.5 kg)   SpO2 93%   BMI 28.01 kg/m , BMI Body mass index is 28.01 kg/m.  Wt Readings from Last 3 Encounters:  06/03/21 195 lb 3.2 oz (88.5 kg)  02/27/21 200 lb (90.7 kg)  08/30/20 212 lb (96.2 kg)    General: Elderly male, appears comfortable at rest. HEENT: Conjunctiva and lids normal, wearing a mask. Neck: Supple, no elevated JVP or carotid bruits, no thyromegaly. Lungs: Clear to auscultation, nonlabored breathing at rest. Cardiac: Regular rate and rhythm, no S3, 3/6 systolic murmur, no pericardial rub. Extremities: Lower leg edema with dressings in place.  ECG:  An ECG dated 08/30/2020 was personally reviewed today and demonstrated:  Sinus rhythm with prolonged PR interval, right bundle branch block and leftward axis.  Recent Labwork:  July 2022: Hemoglobin A1c 7.9%, cholesterol 112, triglycerides 97, HDL 44, LDL 50, BUN 60, creatinine 2.86, potassium 4.7, AST 15, ALT 19, hemoglobin 10.3, platelets 313  Other Studies Reviewed Today:  Echocardiogram 05/30/2021:  1. Left ventricular ejection fraction, by estimation, is 65 to 70%. The  left  ventricle has normal function. The left ventricle has no regional  wall motion abnormalities. There is mild left ventricular hypertrophy.  Left ventricular diastolic parameters  are consistent with Grade I diastolic dysfunction (impaired relaxation).   2. Right ventricular systolic function is normal. The right ventricular  size  is normal. There is normal pulmonary artery systolic pressure. The  estimated right ventricular systolic pressure is 93.7 mmHg.   3. The mitral valve is degenerative. Trivial mitral valve regurgitation.   4. The aortic valve is tricuspid. There is moderate calcification of the  aortic valve. Aortic valve regurgitation is mild. Moderate to severe  aortic valve stenosis, upper end of scale. Aortic regurgitation PHT  measures 585 msec. Aortic valve mean  gradient measures 26.5 mmHg. Aortic valve Vmax measures 3.38 m/s.   5. The inferior vena cava is normal in size with greater than 50%  respiratory variability, suggesting right atrial pressure of 3 mmHg.   Comparison(s): No significant change in aortic valve gradients compared  with prior study. Dimentionless index decreased to 0.23 more consistent  with severe range. Prior images reviewed side by side.  Assessment and Plan:  1.  Moderate to severe calcific aortic stenosis, upper end of scale.  Recent echocardiogram reviewed.  No substantial change in status with present functional capacity.  Continuing to follow expectantly, we will repeat an echocardiogram in 6 months.  We have discussed the possibility of TAVR evaluation.  2.  Paroxysmal atrial fibrillation with CHA2DS2-VASc score of 5.  He remains on low-dose Eliquis for stroke prophylaxis based on age and renal function, no spontaneous bleeding problems.  Reports no palpitations and remains on Lopressor.  Medication Adjustments/Labs and Tests Ordered: Current medicines are reviewed at length with the patient today.  Concerns regarding medicines are outlined  above.   Tests Ordered: Orders Placed This Encounter  Procedures   ECHOCARDIOGRAM COMPLETE     Medication Changes: No orders of the defined types were placed in this encounter.   Disposition:  Follow up  6 months.  Signed, Satira Sark, MD, [facility] 06/03/2021 1:42 PM    [facility] at [facility] [address] [place], [place] [address] Phone: [phone]; Fax: [phone]

## 2021-06-03 NOTE — Patient Instructions (Signed)
Medication Instructions:  Your physician recommends that you continue on your current medications as directed. Please refer to the Current Medication list given to you today.   *If you need a refill on your cardiac medications before your next appointment, please call your pharmacy*   Lab Work: NONE  If you have labs (blood work) drawn today and your tests are completely normal, you will receive your results only by: . MyChart Message (if you have MyChart) OR . A paper copy in the mail If you have any lab test that is abnormal or we need to change your treatment, we will call you to review the results.   Testing/Procedures: Your physician has requested that you have an echocardiogram. Echocardiography is a painless test that uses sound waves to create images of your heart. It provides your doctor with information about the size and shape of your heart and how well your heart's chambers and valves are working. This procedure takes approximately one hour. There are no restrictions for this procedure.   Follow-Up: At [facility], you and your health needs are our priority.  As part of our continuing mission to provide you with exceptional heart care, we have created designated Provider Care Teams.  These Care Teams include your primary Cardiologist (physician) and Advanced Practice Providers (APPs -  Physician Assistants and Nurse Practitioners) who all work together to provide you with the care you need, when you need it.  We recommend signing up for the patient portal called "MyChart".  Sign up information is provided on this After Visit Summary.  MyChart is used to connect with patients for Virtual Visits (Telemedicine).  Patients are able to view lab/test results, encounter notes, upcoming appointments, etc.  Non-urgent messages can be sent to your provider as well.   To learn more about what you can do with MyChart, go to [email or web address]://www.mychart.com.    Your next appointment:   6  month(s)  The format for your next appointment:   In Person  Provider:   Samuel McDowell, MD   Other Instructions Thank you for choosing [facility]!    

## 2021-06-04 ENCOUNTER — Ambulatory Visit (HOSPITAL_COMMUNITY): Payer: PPO

## 2021-06-04 DIAGNOSIS — R2689 Other abnormalities of gait and mobility: Secondary | ICD-10-CM

## 2021-06-04 DIAGNOSIS — L97811 Non-pressure chronic ulcer of other part of right lower leg limited to breakdown of skin: Secondary | ICD-10-CM

## 2021-06-04 NOTE — Therapy (Signed)
[place], [place], [address] Phone: [phone]   Fax:  [phone]  Wound Care Therapy  Patient Details  Name: [name] MRN: [id number] Date of Birth: Feb 07, 1929 Referring Provider (PT): [name]   Encounter Date: 06/04/2021   PT End of Session - 06/04/21 1401     Visit Number 41    Number of Visits 51    Date for PT Re-Evaluation 07/10/21    Authorization Type [employer] (no auth req, no visit limit)    Progress Note Due on Visit 81    PT Start Time 1312    PT Stop Time 1351    PT Time Calculation (min) 39 min    Activity Tolerance Patient tolerated treatment well    Behavior During Therapy WFL for tasks assessed/performed             Past Medical History:  Diagnosis Date   Allergic rhinitis    Aortic stenosis    Asthmatic bronchitis    Celiac disease    Cellulitis    CKD (chronic kidney disease)    Diverticulosis    Fx. left wrist 1987   Gastric polyps    GERD (gastroesophageal reflux disease)    History of pneumonia    Hypertension    Hypothyroidism    Iron deficiency anemia    Melanoma ([place])    Neuropathy    PAF (paroxysmal atrial fibrillation) (HCC)    Prostate cancer ([place])    REM sleep behavior disorder    RLS (restless legs syndrome)    Sleep apnea    Spasticity    Stroke ([place]) 09/2017   Tremor    Type 2 diabetes mellitus ([place])     Past Surgical History:  Procedure Laterality Date   APPENDECTOMY  1978   CARPAL TUNNEL RELEASE Left    CATARACT EXTRACTION  03/2011   bilateral    CHOLECYSTECTOMY  2001   COLONOSCOPY W/ BIOPSIES  04/27/2008   diverticulosis, duodenitis   FOOT SURGERY     HERNIA REPAIR  1994   bilateral   KNEE SURGERY Bilateral    x 2   LUNG SURGERY  2001   for infection   PROSTATECTOMY  2002   UPPER GASTROINTESTINAL ENDOSCOPY  04/27/2008   celiac disease, gastric polyps    There were no vitals filed for this visit.    Subjective  Assessment - 06/04/21 1354     Subjective Reports last day of antibiotics.  No reports of pain.  Arrived with dressings intact.                       Wound Therapy - 06/04/21 0001     Subjective Reports last day of antibiotics.  No reports of pain.  Arrived with dressings intact.    Patient and Family Stated Goals wound to heal    Date of Onset 09/06/20    Prior Treatments MD and self care    Pain Score 0-No pain    Evaluation and Treatment Procedures Explained to Patient/Family Yes    Evaluation and Treatment Procedures agreed to    Wound Properties Date First Assessed: 04/26/21 Time First Assessed: 0254 Wound Type: Non-pressure wound Location: Leg Location Orientation: Right Wound Description (Comments): superior to original wound Present on Admission: No   Wound Image Images linked: 1    Dressing Type Hydrogel;Santyl;Gauze (Comment)    Dressing Changed Changed    Dressing Status Old drainage  Dressing Change Frequency PRN    Site / Wound Assessment Yellow;Pink    % Wound base Red or Granulating 20%    % Wound base Yellow/Fibrinous Exudate 80%    Peri-wound Assessment Erythema (blanchable)    Margins Attached edges (approximated)    Drainage Amount Scant    Drainage Description Serous    Treatment Cleansed;Debridement (Selective)    Wound Properties Date First Assessed: 12/04/20 Time First Assessed: 1545 Wound Type: Venous stasis ulcer Location: Leg Location Orientation: Right Present on Admission: Yes   Wound Image Images linked: 1    Dressing Type Hydrogel;Santyl;Gauze (Comment)    Dressing Changed Changed    Dressing Status Old drainage    Dressing Change Frequency PRN    Site / Wound Assessment Granulation tissue    % Wound base Red or Granulating 100%   following debridement   Peri-wound Assessment Erythema (blanchable)    Margins Attached edges (approximated)    Drainage Amount Scant    Drainage Description Serosanguineous    Treatment  Cleansed;Debridement (Selective)    Selective Debridement - Location wound beds    Selective Debridement - Tools Used Scalpel    Selective Debridement - Tissue Removed slough    Wound Therapy - Clinical Statement Maceration present all perimeters of primary wound this session.  Selective debridment for removal of devitalized tissue and slough from wound bed.  Primary wound reducing and 10 epithelial cells on superior wound with increased ease of slough from wound bed.  Continued with same dressings.    Wound Therapy - Functional Problem List gait and balance    Factors Delaying/Impairing Wound Healing Diabetes Mellitus;Vascular compromise;Altered sensation    Wound Therapy - Frequency 2X / week    Wound Plan continue with wound care    Dressing  both lateral wounds:  santyl, 2x2,  hydrogel, 4x4 f/b sleeve used for lymphedema pt and coban.                       PT Short Term Goals - 02/25/21 1502       PT SHORT TERM GOAL #1   Title Patient will have at least 50% granualtion tissue.    Time 3    Period Weeks    Status Achieved    Target Date 12/25/20               PT Long Term Goals - 01/03/21 1344       PT LONG TERM GOAL #1   Title Patient's wound will be healed to reduce the risk of infection.    Time 6    Period Weeks    Status On-going      PT LONG TERM GOAL #2   Title Patient will be able to verbalize completion of daily skin checks to reduce the risk of further wound development.    Time 6    Period Weeks    Status On-going                    Patient will benefit from skilled therapeutic intervention in order to improve the following deficits and impairments:     Visit Diagnosis: Other abnormalities of gait and mobility  Ulcer of right pretibial region, limited to breakdown of skin [facility])     Problem List Patient Active Problem List   Diagnosis Date Noted   Advanced nonexudative age-related macular degeneration of right eye  without subfoveal involvement 11/15/2020   Advanced nonexudative age-related macular degeneration  of left eye without subfoveal involvement 11/15/2020   Diabetes mellitus without complication ([place]) 33/43/5686   Posterior vitreous detachment of both eyes 11/15/2020   Gait disturbance, post-stroke 11/02/2017   Aphasia as late effect of cerebrovascular accident 11/02/2017   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   Aphasia    PAF (paroxysmal atrial fibrillation) ([place])    Diabetes mellitus type 2 in obese ([place])    Benign essential HTN    Hypothyroidism    Hyperlipidemia    Stroke (cerebrum) ([place]) 10/02/2017   Afib ([place]) 01/10/2017   SOB (shortness of breath) 16/83/7290   Acute diastolic CHF (congestive heart failure) ([place]) 01/10/2017   Atrial fibrillation with RVR ([place]) 01/10/2017   Claudication ([place]) 07/25/2016   Paroxysmal atrial fibrillation ([place]) 07/25/2016   Odynophagia 04/10/2014   Allergic rhinitis 03/23/2014   Chronic rhinitis 12/22/2013   Intrinsic asthma 07/15/2013   Constipation 08/22/2011   Obesity 09/20/2009   G E R D 11/29/2007   Celiac disease 11/29/2007   Ihor Austin, LPTA/CLT; CBIS [phone]  Aldona Lento 06/04/2021, 2:02 PM  [place], [place], [address] Phone: [phone]   Fax:  [phone]  Name: [name] MRN: [id number] Date of Birth: Feb 20, 1929

## 2021-06-07 ENCOUNTER — Ambulatory Visit (HOSPITAL_COMMUNITY): Payer: PPO | Admitting: Physical Therapy

## 2021-06-07 ENCOUNTER — Encounter (HOSPITAL_COMMUNITY): Payer: Self-pay | Admitting: Physical Therapy

## 2021-06-07 ENCOUNTER — Other Ambulatory Visit: Payer: Self-pay

## 2021-06-07 DIAGNOSIS — R2689 Other abnormalities of gait and mobility: Secondary | ICD-10-CM

## 2021-06-07 DIAGNOSIS — L97811 Non-pressure chronic ulcer of other part of right lower leg limited to breakdown of skin: Secondary | ICD-10-CM | POA: Diagnosis not present

## 2021-06-07 DIAGNOSIS — M1712 Unilateral primary osteoarthritis, left knee: Secondary | ICD-10-CM | POA: Diagnosis not present

## 2021-06-07 NOTE — Therapy (Signed)
[place], [place], [address] Phone: [phone]   Fax:  [phone]  Wound Care Therapy  Patient Details  Name: [name] MRN: [id number] Date of Birth: 10-Dec-1928 Referring Provider (PT): [name]   Encounter Date: 06/07/2021   PT End of Session - 06/07/21 1528     Visit Number 87    Number of Visits 66    Date for PT Re-Evaluation 07/10/21    Authorization Type [employer] (no auth req, no visit limit)    Progress Note Due on Visit 36    PT Start Time 1445    PT Stop Time 1522    PT Time Calculation (min) 37 min    Activity Tolerance Patient tolerated treatment well    Behavior During Therapy [facility] for tasks assessed/performed             Past Medical History:  Diagnosis Date   Allergic rhinitis    Aortic stenosis    Asthmatic bronchitis    Celiac disease    Cellulitis    CKD (chronic kidney disease)    Diverticulosis    Fx. left wrist 1987   Gastric polyps    GERD (gastroesophageal reflux disease)    History of pneumonia    Hypertension    Hypothyroidism    Iron deficiency anemia    Melanoma ([place])    Neuropathy    PAF (paroxysmal atrial fibrillation) (HCC)    Prostate cancer ([place])    REM sleep behavior disorder    RLS (restless legs syndrome)    Sleep apnea    Spasticity    Stroke ([place]) 09/2017   Tremor    Type 2 diabetes mellitus ([place])     Past Surgical History:  Procedure Laterality Date   APPENDECTOMY  1978   CARPAL TUNNEL RELEASE Left    CATARACT EXTRACTION  03/2011   bilateral    CHOLECYSTECTOMY  2001   COLONOSCOPY W/ BIOPSIES  04/27/2008   diverticulosis, duodenitis   FOOT SURGERY     HERNIA REPAIR  1994   bilateral   KNEE SURGERY Bilateral    x 2   LUNG SURGERY  2001   for infection   PROSTATECTOMY  2002   UPPER GASTROINTESTINAL ENDOSCOPY  04/27/2008   celiac disease, gastric polyps    There were no vitals filed for this visit.      [facility] PT  Assessment - 06/07/21 0001       Assessment   Medical Diagnosis Chronic venous stasis wound    Referring Provider (PT) [name]                     Wound Therapy - 06/07/21 0001     Subjective Reports his left knee is bothering him, states that he got an injection for the pain today    Patient and Family Stated Goals wound to heal    Date of Onset 09/06/20    Prior Treatments MD and self care    Pain Score 0-No pain    Evaluation and Treatment Procedures Explained to Patient/Family Yes    Evaluation and Treatment Procedures agreed to    Wound Properties Date First Assessed: 04/26/21 Time First Assessed: 5397 Wound Type: Non-pressure wound Location: Leg Location Orientation: Right Wound Description (Comments): superior to original wound Present on Admission: No   Wound Image Images linked: 1    Dressing Type Hydrogel;Santyl    Dressing Changed Changed  Dressing Status Old drainage    Dressing Change Frequency PRN    Site / Wound Assessment Pink;Yellow    % Wound base Red or Granulating 20%    % Wound base Yellow/Fibrinous Exudate 80%    Peri-wound Assessment Erythema (blanchable);Edema;Maceration   redness   Wound Length (cm) 1 cm   was .9   Wound Width (cm) 1.2 cm   was 1.3   Wound Surface Area (cm^2) 1.2 cm^2    Margins Attached edges (approximated)    Drainage Amount Scant    Drainage Description Serosanguineous    Treatment Cleansed;Debridement (Selective)    Wound Properties Date First Assessed: 12/04/20 Time First Assessed: 1545 Wound Type: Venous stasis ulcer Location: Leg Location Orientation: Right Present on Admission: Yes   Wound Image Images linked: 1    Dressing Type Hydrogel;Santyl    Dressing Changed Changed    Dressing Status Old drainage    Dressing Change Frequency PRN    Site / Wound Assessment Granulation tissue    % Wound base Red or Granulating 95%    % Wound base Yellow/Fibrinous Exudate 5%    Peri-wound Assessment Erythema  (blanchable);Edema    Wound Length (cm) 1.6 cm   was 1.5   Wound Width (cm) 1.2 cm   1.3   Wound Depth (cm) 0.2 cm   .2   Wound Volume (cm^3) 0.38 cm^3    Wound Surface Area (cm^2) 1.92 cm^2    Margins Attached edges (approximated)    Drainage Amount Scant    Drainage Description Serosanguineous    Treatment Cleansed;Debridement (Selective)    Selective Debridement - Location wound beds    Selective Debridement - Tools Used Scalpel    Selective Debridement - Tissue Removed slough    Wound Therapy - Clinical Statement Increased pain noted with debridement. Increased redness to superior wound noted today, patient with recent conclusion of round of antibiotics on Tuesday. Redness define but not deep in color. Took picture of wound, will capture picture of superior wound today to monitor for change and notify MD if there is a change in redness or wound bed. Inferior wound with increased pain as well and unable to debride to 100% granulation tissue on this date. Continued with Santyl and hydrogel followed by kerlix, lymph netting and coban.    Wound Therapy - Functional Problem List gait and balance    Factors Delaying/Impairing Wound Healing Diabetes Mellitus;Vascular compromise;Altered sensation    Wound Therapy - Frequency 2X / week    Wound Plan continue with wound care    Dressing  both lateral wounds:  santyl, 2x2,  hydrogel, 4x4 f/b sleeve used for lymphedema pt and coban.                       PT Short Term Goals - 02/25/21 1502       PT SHORT TERM GOAL #1   Title Patient will have at least 50% granualtion tissue.    Time 3    Period Weeks    Status Achieved    Target Date 12/25/20               PT Long Term Goals - 01/03/21 1344       PT LONG TERM GOAL #1   Title Patient's wound will be healed to reduce the risk of infection.    Time 6    Period Weeks    Status On-going      PT LONG TERM GOAL #  2   Title Patient will be able to verbalize completion of  daily skin checks to reduce the risk of further wound development.    Time 6    Period Weeks    Status On-going                    Patient will benefit from skilled therapeutic intervention in order to improve the following deficits and impairments:     Visit Diagnosis: Other abnormalities of gait and mobility  Ulcer of right pretibial region, limited to breakdown of skin [facility])     Problem List Patient Active Problem List   Diagnosis Date Noted   Advanced nonexudative age-related macular degeneration of right eye without subfoveal involvement 11/15/2020   Advanced nonexudative age-related macular degeneration of left eye without subfoveal involvement 11/15/2020   Diabetes mellitus without complication ([place]) 80/99/8338   Posterior vitreous detachment of both eyes 11/15/2020   Gait disturbance, post-stroke 11/02/2017   Aphasia as late effect of cerebrovascular accident 11/02/2017   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   Aphasia    PAF (paroxysmal atrial fibrillation) ([place])    Diabetes mellitus type 2 in obese ([place])    Benign essential HTN    Hypothyroidism    Hyperlipidemia    Stroke (cerebrum) ([place]) 10/02/2017   Afib ([place]) 01/10/2017   SOB (shortness of breath) 25/02/3975   Acute diastolic CHF (congestive heart failure) ([place]) 01/10/2017   Atrial fibrillation with RVR ([place]) 01/10/2017   Claudication ([place]) 07/25/2016   Paroxysmal atrial fibrillation ([place]) 07/25/2016   Odynophagia 04/10/2014   Allergic rhinitis 03/23/2014   Chronic rhinitis 12/22/2013   Intrinsic asthma 07/15/2013   Constipation 08/22/2011   Obesity 09/20/2009   G E R D 11/29/2007   Celiac disease 11/29/2007    3:50 PM, 06/07/21 Jerene Pitch, DPT Physical Therapy with [facility]  [phone] office   [place] [address] [place], [place], [address] Phone: [phone]   Fax:  [phone]  Name:  GONSALO CUTHBERTSON MRN: [id number] Date of Birth: 1928-12-12

## 2021-06-11 ENCOUNTER — Ambulatory Visit (HOSPITAL_COMMUNITY): Payer: PPO | Admitting: Physical Therapy

## 2021-06-11 ENCOUNTER — Other Ambulatory Visit: Payer: Self-pay

## 2021-06-11 DIAGNOSIS — R2689 Other abnormalities of gait and mobility: Secondary | ICD-10-CM

## 2021-06-11 DIAGNOSIS — L97811 Non-pressure chronic ulcer of other part of right lower leg limited to breakdown of skin: Secondary | ICD-10-CM | POA: Diagnosis not present

## 2021-06-11 NOTE — Therapy (Signed)
[place], [place], [address] Phone: [phone]   Fax:  [phone]  Wound Care Therapy  Patient Details  Name: [name] MRN: [id number] Date of Birth: 09/26/1929 Referring Provider (PT): [name]   Encounter Date: 06/11/2021   PT End of Session - 06/11/21 1653     Visit Number 62    Number of Visits 50    Date for PT Re-Evaluation 07/10/21    Authorization Type [employer] (no auth req, no visit limit)    Progress Note Due on Visit 30    PT Start Time 1406    PT Stop Time 1445    PT Time Calculation (min) 39 min    Activity Tolerance Patient tolerated treatment well    Behavior During Therapy [facility] for tasks assessed/performed             Past Medical History:  Diagnosis Date   Allergic rhinitis    Aortic stenosis    Asthmatic bronchitis    Celiac disease    Cellulitis    CKD (chronic kidney disease)    Diverticulosis    Fx. left wrist 1987   Gastric polyps    GERD (gastroesophageal reflux disease)    History of pneumonia    Hypertension    Hypothyroidism    Iron deficiency anemia    Melanoma ([place])    Neuropathy    PAF (paroxysmal atrial fibrillation) ([place])    Prostate cancer ([place])    REM sleep behavior disorder    RLS (restless legs syndrome)    Sleep apnea    Spasticity    Stroke ([place]) 09/2017   Tremor    Type 2 diabetes mellitus ([place])     Past Surgical History:  Procedure Laterality Date   APPENDECTOMY  1978   CARPAL TUNNEL RELEASE Left    CATARACT EXTRACTION  03/2011   bilateral    CHOLECYSTECTOMY  2001   COLONOSCOPY W/ BIOPSIES  04/27/2008   diverticulosis, duodenitis   FOOT SURGERY     HERNIA REPAIR  1994   bilateral   KNEE SURGERY Bilateral    x 2   LUNG SURGERY  2001   for infection   PROSTATECTOMY  2002   UPPER GASTROINTESTINAL ENDOSCOPY  04/27/2008   celiac disease, gastric polyps    There were no vitals filed for this  visit.               Wound Therapy - 06/11/21 1641     Subjective Pt states he has an appointment with MD for his knee on 9/9 at noon.  States his leg is feeling fine today.    Patient and Family Stated Goals wound to heal    Date of Onset 09/06/20    Prior Treatments MD and self care    Pain Scale 0-10    Pain Score 0-No pain    Evaluation and Treatment Procedures Explained to Patient/Family Yes    Evaluation and Treatment Procedures agreed to    Wound Properties Date First Assessed: 04/26/21 Time First Assessed: 3154 Wound Type: Non-pressure wound Location: Leg Location Orientation: Right Wound Description (Comments): superior to original wound Present on Admission: No   Wound Image Images linked: 1    Dressing Type Santyl;Gauze (Comment)    Dressing Changed Changed    Dressing Status Old drainage    Dressing Change Frequency PRN    Site / Wound Assessment Pink;Yellow;Red    % Wound base Red  or Granulating 20%    % Wound base Yellow/Fibrinous Exudate 80%    Peri-wound Assessment Erythema (blanchable)    Wound Length (cm) 1 cm    Wound Width (cm) 1.2 cm    Wound Depth (cm) 0 cm    Wound Volume (cm^3) 0 cm^3    Wound Surface Area (cm^2) 1.2 cm^2    Drainage Amount Scant    Drainage Description Sanguineous    Treatment Cleansed;Debridement (Selective)    Wound Properties Date First Assessed: 12/04/20 Time First Assessed: 1545 Wound Type: Venous stasis ulcer Location: Leg Location Orientation: Right Present on Admission: Yes   Wound Image Images linked: 1    Dressing Type Impregnated gauze (bismuth)    Dressing Changed Changed    Dressing Status Old drainage    Dressing Change Frequency PRN    Site / Wound Assessment Granulation tissue    % Wound base Red or Granulating 100%    % Wound base Yellow/Fibrinous Exudate 0%    Peri-wound Assessment Erythema (blanchable)    Wound Length (cm) 1 cm    Wound Width (cm) 1 cm    Wound Depth (cm) 0.2 cm    Wound Volume (cm^3)  0.2 cm^3    Wound Surface Area (cm^2) 1 cm^2    Margins Attached edges (approximated)    Drainage Amount Scant    Drainage Description Serosanguineous    Treatment Cleansed;Debridement (Selective)    Selective Debridement - Location wound beds    Selective Debridement - Tools Used Scalpel    Selective Debridement - Tissue Removed slough    Wound Therapy - Clinical Statement No pain with debridement today.  Continues to have redness perimeter of both wounds, however last treating therapist inspected LE to appear improved as compared to last visit.  Wounds photographed and measured today.  Superior wound without change in size but distal smaller and now 100% granulated following slough removal.  Changed dressing to xeroform for distal wound and continued with santyl for superior.  Xerform also placed over anterior areas that were dry and weepy.  Vaseline applied to foot and above irritated area on LE.  LE cleansed with sterile gauze and sterile water following debridement.    Wound Therapy - Functional Problem List gait and balance    Factors Delaying/Impairing Wound Healing Diabetes Mellitus;Vascular compromise;Altered sensation    Wound Therapy - Frequency 2X / week    Wound Plan continue with wound care    Dressing  superior wound: santyl, 2x2,  hydrogel, distal wound: xeroform.  4x4 f/b sleeve used for lymphedema pt and coban.                       PT Short Term Goals - 02/25/21 1502       PT SHORT TERM GOAL #1   Title Patient will have at least 50% granualtion tissue.    Time 3    Period Weeks    Status Achieved    Target Date 12/25/20               PT Long Term Goals - 01/03/21 1344       PT LONG TERM GOAL #1   Title Patient's wound will be healed to reduce the risk of infection.    Time 6    Period Weeks    Status On-going      PT LONG TERM GOAL #2   Title Patient will be able to verbalize completion of daily skin checks to  reduce the risk of further  wound development.    Time 6    Period Weeks    Status On-going                    Patient will benefit from skilled therapeutic intervention in order to improve the following deficits and impairments:     Visit Diagnosis: Other abnormalities of gait and mobility  Ulcer of right pretibial region, limited to breakdown of skin [facility])     Problem List Patient Active Problem List   Diagnosis Date Noted   Advanced nonexudative age-related macular degeneration of right eye without subfoveal involvement 11/15/2020   Advanced nonexudative age-related macular degeneration of left eye without subfoveal involvement 11/15/2020   Diabetes mellitus without complication ([place]) 97/47/1855   Posterior vitreous detachment of both eyes 11/15/2020   Gait disturbance, post-stroke 11/02/2017   Aphasia as late effect of cerebrovascular accident 11/02/2017   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   Aphasia    PAF (paroxysmal atrial fibrillation) ([place])    Diabetes mellitus type 2 in obese ([place])    Benign essential HTN    Hypothyroidism    Hyperlipidemia    Stroke (cerebrum) ([place]) 10/02/2017   Afib ([place]) 01/10/2017   SOB (shortness of breath) 01/58/6825   Acute diastolic CHF (congestive heart failure) ([place]) 01/10/2017   Atrial fibrillation with RVR ([place]) 01/10/2017   Claudication ([place]) 07/25/2016   Paroxysmal atrial fibrillation ([place]) 07/25/2016   Odynophagia 04/10/2014   Allergic rhinitis 03/23/2014   Chronic rhinitis 12/22/2013   Intrinsic asthma 07/15/2013   Constipation 08/22/2011   Obesity 09/20/2009   G E R D 11/29/2007   Celiac disease 11/29/2007   Teena Irani, PTA/CLT [phone]  Teena Irani 06/11/2021, 4:56 PM  [place] [address] [place], [place], [address] Phone: [phone]   Fax:  [phone]  Name: [name] MRN: [id number] Date of Birth: [birth year]

## 2021-06-12 DIAGNOSIS — E1165 Type 2 diabetes mellitus with hyperglycemia: Secondary | ICD-10-CM | POA: Diagnosis not present

## 2021-06-12 DIAGNOSIS — E119 Type 2 diabetes mellitus without complications: Secondary | ICD-10-CM | POA: Diagnosis not present

## 2021-06-12 DIAGNOSIS — I10 Essential (primary) hypertension: Secondary | ICD-10-CM | POA: Diagnosis not present

## 2021-06-14 ENCOUNTER — Ambulatory Visit (HOSPITAL_COMMUNITY): Payer: PPO | Attending: Internal Medicine | Admitting: Physical Therapy

## 2021-06-14 ENCOUNTER — Other Ambulatory Visit: Payer: Self-pay

## 2021-06-14 ENCOUNTER — Encounter (HOSPITAL_COMMUNITY): Payer: Self-pay | Admitting: Physical Therapy

## 2021-06-14 DIAGNOSIS — R2689 Other abnormalities of gait and mobility: Secondary | ICD-10-CM | POA: Diagnosis not present

## 2021-06-14 DIAGNOSIS — L97811 Non-pressure chronic ulcer of other part of right lower leg limited to breakdown of skin: Secondary | ICD-10-CM | POA: Insufficient documentation

## 2021-06-14 NOTE — Therapy (Signed)
[place], [place], [address] Phone: [phone]   Fax:  [phone]  Wound Care Therapy  Patient Details  Name: [name] MRN: [id number] Date of Birth: 1928/12/03 Referring Provider (PT): [name]   Encounter Date: 06/14/2021   PT End of Session - 06/14/21 1219     Visit Number 25    Number of Visits 64    Date for PT Re-Evaluation 07/10/21    Authorization Type [employer] (no auth req, no visit limit)    Progress Note Due on Visit 75    PT Start Time 1130    PT Stop Time 1210    PT Time Calculation (min) 40 min    Activity Tolerance Patient tolerated treatment well    Behavior During Therapy [facility] for tasks assessed/performed             Past Medical History:  Diagnosis Date   Allergic rhinitis    Aortic stenosis    Asthmatic bronchitis    Celiac disease    Cellulitis    CKD (chronic kidney disease)    Diverticulosis    Fx. left wrist 1987   Gastric polyps    GERD (gastroesophageal reflux disease)    History of pneumonia    Hypertension    Hypothyroidism    Iron deficiency anemia    Melanoma ([place])    Neuropathy    PAF (paroxysmal atrial fibrillation) ([place])    Prostate cancer ([place])    REM sleep behavior disorder    RLS (restless legs syndrome)    Sleep apnea    Spasticity    Stroke ([place]) 09/2017   Tremor    Type 2 diabetes mellitus ([place])     Past Surgical History:  Procedure Laterality Date   APPENDECTOMY  1978   CARPAL TUNNEL RELEASE Left    CATARACT EXTRACTION  03/2011   bilateral    CHOLECYSTECTOMY  2001   COLONOSCOPY W/ BIOPSIES  04/27/2008   diverticulosis, duodenitis   FOOT SURGERY     HERNIA REPAIR  1994   bilateral   KNEE SURGERY Bilateral    x 2   LUNG SURGERY  2001   for infection   PROSTATECTOMY  2002   UPPER GASTROINTESTINAL ENDOSCOPY  04/27/2008   celiac disease, gastric polyps    There were no vitals filed for this  visit.               Wound Therapy - 06/14/21 0001     Subjective PT states the dressing is comfortable and he is painfree    Patient and Family Stated Goals wound to heal    Date of Onset 09/06/20    Prior Treatments MD and self care    Pain Score 0-No pain    Evaluation and Treatment Procedures Explained to Patient/Family Yes    Evaluation and Treatment Procedures agreed to    Wound Properties Date First Assessed: 04/26/21 Time First Assessed: 2536 Wound Type: Non-pressure wound Location: Leg Location Orientation: Right Wound Description (Comments): superior to original wound Present on Admission: No   Wound Image Images linked: 1    Dressing Type Gauze (Comment);Hydrogel;Santyl    Dressing Changed New    Dressing Status Old drainage    Dressing Change Frequency PRN    Site / Wound Assessment Pink;Yellow    % Wound base Red or Granulating 35%    % Wound base Yellow/Fibrinous Exudate 65%    Peri-wound Assessment Erythema (blanchable)  Drainage Amount Scant    Drainage Description Serosanguineous    Treatment Cleansed;Debridement (Selective)    Wound Properties Date First Assessed: 12/04/20 Time First Assessed: 1545 Wound Type: Venous stasis ulcer Location: Leg Location Orientation: Right Present on Admission: Yes   Dressing Type Impregnated gauze (bismuth);Gauze (Comment)    Dressing Changed Changed    Dressing Status Old drainage    Dressing Change Frequency PRN    Site / Wound Assessment Granulation tissue    % Wound base Red or Granulating 100%    % Wound base Yellow/Fibrinous Exudate 0%    Drainage Amount Scant    Drainage Description Serosanguineous    Treatment Cleansed    Selective Debridement - Location wound beds    Selective Debridement - Tools Used Scalpel    Selective Debridement - Tissue Removed slough    Wound Therapy - Clinical Statement Perimeter redness continues to improve.  Lower wound is almost healed although mm mass did not rise to allow  wound site to be even with the rest of his skin.  Improved granualtion with superior wound.    Wound Therapy - Functional Problem List gait and balance    Factors Delaying/Impairing Wound Healing Diabetes Mellitus;Vascular compromise;Altered sensation    Wound Therapy - Frequency 2X / week    Wound Plan continue with wound care    Dressing  superior wound: santyl, 2x2,  hydrogel, distal wound: xeroform.  4x4 f/b sleeve used for lymphedema pt and coban.                       PT Short Term Goals - 02/25/21 1502       PT SHORT TERM GOAL #1   Title Patient will have at least 50% granualtion tissue.    Time 3    Period Weeks    Status Achieved    Target Date 12/25/20               PT Long Term Goals - 01/03/21 1344       PT LONG TERM GOAL #1   Title Patient's wound will be healed to reduce the risk of infection.    Time 6    Period Weeks    Status On-going      PT LONG TERM GOAL #2   Title Patient will be able to verbalize completion of daily skin checks to reduce the risk of further wound development.    Time 6    Period Weeks    Status On-going                   Plan - 06/14/21 1220     Clinical Impression Statement as above    Personal Factors and Comorbidities Age;Comorbidity 3+;Past/Current Experience;Fitness;Time since onset of injury/illness/exacerbation    Comorbidities DM, CKD, neuorpathy, Hx CVA    Examination-Activity Limitations Locomotion Level;Transfers;Squat;Stand;Dressing    Examination-Participation Restrictions Community Activity    Stability/Clinical Decision Making Stable/Uncomplicated    Rehab Potential Good    PT Frequency 2x / week    PT Duration 8 weeks   increased time secondary to slow healing wound   PT Treatment/Interventions Gait training;Stair training;Functional mobility training;DME Instruction;Therapeutic activities;Therapeutic exercise;Balance training;Neuromuscular re-education;Patient/family  education;Compression bandaging;Manual lymph drainage;Manual techniques    PT Next Visit Plan Continue with both chemical and mechanical debridement to remove adherent eschar to allow healing to occur    Consulted and Agree with Plan of Care Patient;Family member/caregiver    Family Member Consulted wife  Patient will benefit from skilled therapeutic intervention in order to improve the following deficits and impairments:  Abnormal gait, Decreased skin integrity, Decreased mobility, Impaired sensation  Visit Diagnosis: Other abnormalities of gait and mobility  Ulcer of right pretibial region, limited to breakdown of skin [facility]     Problem List Patient Active Problem List   Diagnosis Date Noted   Advanced nonexudative age-related macular degeneration of right eye without subfoveal involvement 11/15/2020   Advanced nonexudative age-related macular degeneration of left eye without subfoveal involvement 11/15/2020   Diabetes mellitus without complication ([place]) 34/28/7681   Posterior vitreous detachment of both eyes 11/15/2020   Gait disturbance, post-stroke 11/02/2017   Aphasia as late effect of cerebrovascular accident 11/02/2017   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   Aphasia    PAF (paroxysmal atrial fibrillation) ([place])    Diabetes mellitus type 2 in obese ([place])    Benign essential HTN    Hypothyroidism    Hyperlipidemia    Stroke (cerebrum) ([place]) 10/02/2017   Afib ([place]) 01/10/2017   SOB (shortness of breath) 15/72/6203   Acute diastolic CHF (congestive heart failure) ([place]) 01/10/2017   Atrial fibrillation with RVR ([place]) 01/10/2017   Claudication ([place]) 07/25/2016   Paroxysmal atrial fibrillation ([place]) 07/25/2016   Odynophagia 04/10/2014   Allergic rhinitis 03/23/2014   Chronic rhinitis 12/22/2013   Intrinsic asthma 07/15/2013   Constipation 08/22/2011   Obesity 09/20/2009   G E R D 11/29/2007   Celiac disease 11/29/2007   Rayetta Humphrey,  PT CLT [phone]  06/14/2021, 12:20 PM  [place] [address] [place], [place], [address] Phone: [phone]   Fax:  [phone]  Name: [name] MRN: [id number] Date of Birth: 1928/11/29

## 2021-06-19 ENCOUNTER — Other Ambulatory Visit: Payer: Self-pay

## 2021-06-19 ENCOUNTER — Ambulatory Visit (HOSPITAL_COMMUNITY): Payer: PPO | Admitting: Physical Therapy

## 2021-06-19 DIAGNOSIS — R2689 Other abnormalities of gait and mobility: Secondary | ICD-10-CM

## 2021-06-19 DIAGNOSIS — L97811 Non-pressure chronic ulcer of other part of right lower leg limited to breakdown of skin: Secondary | ICD-10-CM

## 2021-06-19 NOTE — Therapy (Signed)
[place], [place], [address] Phone: [phone]   Fax:  [phone]  Wound Care Therapy  Patient Details  Name: ANIKEN MONESTIME MRN: [id number] Date of Birth: 1929/05/03 Referring Provider (PT): [name]   Encounter Date: 06/19/2021   PT End of Session - 06/19/21 1606     Visit Number 41    Number of Visits 76    Date for PT Re-Evaluation 07/10/21    Authorization Type [employer] (no auth req, no visit limit)    Progress Note Due on Visit 39    PT Start Time 1448    PT Stop Time 1526    PT Time Calculation (min) 38 min    Activity Tolerance Patient tolerated treatment well    Behavior During Therapy [facility] for tasks assessed/performed             Past Medical History:  Diagnosis Date   Allergic rhinitis    Aortic stenosis    Asthmatic bronchitis    Celiac disease    Cellulitis    CKD (chronic kidney disease)    Diverticulosis    Fx. left wrist 1987   Gastric polyps    GERD (gastroesophageal reflux disease)    History of pneumonia    Hypertension    Hypothyroidism    Iron deficiency anemia    Melanoma ([place])    Neuropathy    PAF (paroxysmal atrial fibrillation) ([place])    Prostate cancer ([place])    REM sleep behavior disorder    RLS (restless legs syndrome)    Sleep apnea    Spasticity    Stroke ([place]) 09/2017   Tremor    Type 2 diabetes mellitus ([place])     Past Surgical History:  Procedure Laterality Date   APPENDECTOMY  1978   CARPAL TUNNEL RELEASE Left    CATARACT EXTRACTION  03/2011   bilateral    CHOLECYSTECTOMY  2001   COLONOSCOPY W/ BIOPSIES  04/27/2008   diverticulosis, duodenitis   FOOT SURGERY     HERNIA REPAIR  1994   bilateral   KNEE SURGERY Bilateral    x 2   LUNG SURGERY  2001   for infection   PROSTATECTOMY  2002   UPPER GASTROINTESTINAL ENDOSCOPY  04/27/2008   celiac disease, gastric polyps    There were no vitals filed for this  visit.               Wound Therapy - 06/19/21 1551     Subjective pt reports no issues today.    Patient and Family Stated Goals wound to heal    Date of Onset 09/06/20    Prior Treatments MD and self care    Pain Score 0-No pain    Evaluation and Treatment Procedures Explained to Patient/Family Yes    Evaluation and Treatment Procedures agreed to    Wound Properties Date First Assessed: 04/26/21 Time First Assessed: 0017 Wound Type: Non-pressure wound Location: Leg Location Orientation: Right Wound Description (Comments): superior to original wound Present on Admission: No   Wound Image Images linked: 1    Dressing Type Gauze (Comment)    Dressing Changed New    Dressing Status Old drainage    Dressing Change Frequency PRN    Site / Wound Assessment Red;Pink;Yellow    % Wound base Red or Granulating 40%    % Wound base Yellow/Fibrinous Exudate 60%    Peri-wound Assessment Erythema (blanchable)    Wound Length (  cm) 1 cm    Wound Width (cm) 1.2 cm    Wound Depth (cm) 0 cm    Wound Volume (cm^3) 0 cm^3    Wound Surface Area (cm^2) 1.2 cm^2    Drainage Amount Scant    Drainage Description Serous    Treatment Cleansed;Debridement (Selective)    Wound Properties Date First Assessed: 12/04/20 Time First Assessed: 6433 Wound Type: Venous stasis ulcer Location: Leg Location Orientation: Right Present on Admission: Yes   Wound Image Images linked: 1    Dressing Type Impregnated gauze (bismuth)    Dressing Changed Changed    Dressing Status Old drainage    Dressing Change Frequency PRN    Site / Wound Assessment Granulation tissue    % Wound base Red or Granulating 100%    % Wound base Yellow/Fibrinous Exudate 0%    Peri-wound Assessment Edema    Wound Length (cm) 1 cm    Wound Width (cm) 1 cm    Wound Depth (cm) 0.2 cm    Wound Volume (cm^3) 0.2 cm^3    Wound Surface Area (cm^2) 1 cm^2    Drainage Amount Scant    Drainage Description Serous    Treatment  Cleansed;Debridement (Selective)    Selective Debridement - Location wound beds    Selective Debridement - Tools Used Scalpel    Selective Debridement - Tissue Removed slough    Wound Therapy - Clinical Statement Less redness perimeter of wound and superior wound is granulating well.  Changed dressing to xerform for both woulds today due to reduction of slough.  Pt with some discomfort/sensitivity with sharps debridement to superior wound . Most distal one is not sensitive.  Moisturized LE well with vaseline and continued with use of netting.    Wound Therapy - Functional Problem List gait and balance    Factors Delaying/Impairing Wound Healing Diabetes Mellitus;Vascular compromise;Altered sensation    Wound Therapy - Frequency 2X / week    Wound Plan continue with wound care    Dressing  both wounds:  xeroform.  4x4 f/b sleeve used for lymphedema pt and coban.                       PT Short Term Goals - 02/25/21 1502       PT SHORT TERM GOAL #1   Title Patient will have at least 50% granualtion tissue.    Time 3    Period Weeks    Status Achieved    Target Date 12/25/20               PT Long Term Goals - 01/03/21 1344       PT LONG TERM GOAL #1   Title Patient's wound will be healed to reduce the risk of infection.    Time 6    Period Weeks    Status On-going      PT LONG TERM GOAL #2   Title Patient will be able to verbalize completion of daily skin checks to reduce the risk of further wound development.    Time 6    Period Weeks    Status On-going                    Patient will benefit from skilled therapeutic intervention in order to improve the following deficits and impairments:     Visit Diagnosis: Ulcer of right pretibial region, limited to breakdown of skin ([facility])  Other abnormalities of gait and mobility  Problem List Patient Active Problem List   Diagnosis Date Noted   Advanced nonexudative age-related macular  degeneration of right eye without subfoveal involvement 11/15/2020   Advanced nonexudative age-related macular degeneration of left eye without subfoveal involvement 11/15/2020   Diabetes mellitus without complication ([place]) 67/20/9198   Posterior vitreous detachment of both eyes 11/15/2020   Gait disturbance, post-stroke 11/02/2017   Aphasia as late effect of cerebrovascular accident 11/02/2017   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   Aphasia    PAF (paroxysmal atrial fibrillation) ([place])    Diabetes mellitus type 2 in obese ([place])    Benign essential HTN    Hypothyroidism    Hyperlipidemia    Stroke (cerebrum) ([place]) 10/02/2017   Afib ([place]) 01/10/2017   SOB (shortness of breath) 12/04/7979   Acute diastolic CHF (congestive heart failure) ([place]) 01/10/2017   Atrial fibrillation with RVR ([place]) 01/10/2017   Claudication ([place]) 07/25/2016   Paroxysmal atrial fibrillation ([place]) 07/25/2016   Odynophagia 04/10/2014   Allergic rhinitis 03/23/2014   Chronic rhinitis 12/22/2013   Intrinsic asthma 07/15/2013   Constipation 08/22/2011   Obesity 09/20/2009   G E R D 11/29/2007   Celiac disease 11/29/2007   Teena Irani, PTA/CLT [phone]  Teena Irani 06/19/2021, 4:06 PM  [facility] [address] [place], [place], [address] Phone: [phone]   Fax:  [phone]  Name: MYRLE DUES MRN: [id number] Date of Birth: Jan 30, 1929

## 2021-06-21 ENCOUNTER — Ambulatory Visit (HOSPITAL_COMMUNITY): Payer: PPO | Admitting: Physical Therapy

## 2021-06-21 ENCOUNTER — Encounter (HOSPITAL_COMMUNITY): Payer: Self-pay | Admitting: Physical Therapy

## 2021-06-21 ENCOUNTER — Other Ambulatory Visit: Payer: Self-pay

## 2021-06-21 DIAGNOSIS — R2689 Other abnormalities of gait and mobility: Secondary | ICD-10-CM

## 2021-06-21 DIAGNOSIS — M1712 Unilateral primary osteoarthritis, left knee: Secondary | ICD-10-CM | POA: Diagnosis not present

## 2021-06-21 DIAGNOSIS — L97811 Non-pressure chronic ulcer of other part of right lower leg limited to breakdown of skin: Secondary | ICD-10-CM

## 2021-06-21 NOTE — Therapy (Signed)
[facility] [address] [place], [place], [address] Phone: [phone]   Fax:  [phone]  Wound Care Therapy and Progress Note  Patient Details  Name: [name] MRN: [id number] Date of Birth: [birth year] Referring Provider (PT): [name]   Progress Note Reporting Period 05/14/21 to 06/21/21  See note below for Objective Data and Assessment of Progress/Goals.      Encounter Date: 06/21/2021   PT End of Session - 06/21/21 1641     Visit Number 51    Number of Visits 10    Date for PT Re-Evaluation 07/10/21    Authorization Type [employer] (no auth req, no visit limit)    Progress Note Due on Visit 77    PT Start Time 1533    PT Stop Time 1618    PT Time Calculation (min) 45 min    Activity Tolerance Patient tolerated treatment well    Behavior During Therapy WFL for tasks assessed/performed             Past Medical History:  Diagnosis Date   Allergic rhinitis    Aortic stenosis    Asthmatic bronchitis    Celiac disease    Cellulitis    CKD (chronic kidney disease)    Diverticulosis    Fx. left wrist 1987   Gastric polyps    GERD (gastroesophageal reflux disease)    History of pneumonia    Hypertension    Hypothyroidism    Iron deficiency anemia    Melanoma (HCC)    Neuropathy    PAF (paroxysmal atrial fibrillation) (HCC)    Prostate cancer (HCC)    REM sleep behavior disorder    RLS (restless legs syndrome)    Sleep apnea    Spasticity    Stroke ([place]) 09/2017   Tremor    Type 2 diabetes mellitus ([place])     Past Surgical History:  Procedure Laterality Date   APPENDECTOMY  1978   CARPAL TUNNEL RELEASE Left    CATARACT EXTRACTION  03/2011   bilateral    CHOLECYSTECTOMY  2001   COLONOSCOPY W/ BIOPSIES  04/27/2008   diverticulosis, duodenitis   FOOT SURGERY     HERNIA REPAIR  1994   bilateral   KNEE SURGERY Bilateral    x 2   LUNG SURGERY  2001   for infection   PROSTATECTOMY  2002    UPPER GASTROINTESTINAL ENDOSCOPY  04/27/2008   celiac disease, gastric polyps    There were no vitals filed for this visit.      [facility] PT Assessment - 06/21/21 0001       Assessment   Medical Diagnosis Chronic venous stasis wound    Referring Provider (PT) [name]                     Wound Therapy - 06/21/21 0001     Subjective Reports left knee has been hurting, he just received a shot in it. Reports no pain currently in right leg    Patient and Family Stated Goals wound to heal    Date of Onset 09/06/20    Prior Treatments MD and self care    Pain Score 6     Pain Location Knee    Pain Orientation Left    Evaluation and Treatment Procedures Explained to Patient/Family Yes    Evaluation and Treatment Procedures agreed to    Wound Properties Date First Assessed: 04/26/21 Time First Assessed: 9381  Wound Type: Non-pressure wound Location: Leg Location Orientation: Right Wound Description (Comments): superior to original wound Present on Admission: No   Dressing Type Gauze (Comment);Impregnated gauze (bismuth)    Dressing Changed Changed    Dressing Status Old drainage    Dressing Change Frequency PRN    Site / Wound Assessment Pink;Red;Yellow    % Wound base Red or Granulating 25%    % Wound base Yellow/Fibrinous Exudate 75%   100% prior to debridement   Peri-wound Assessment Erythema (blanchable);Edema   rash   Wound Length (cm) 1 cm    Wound Width (cm) 1.2 cm    Wound Surface Area (cm^2) 1.2 cm^2    Drainage Amount Scant    Drainage Description Serous    Treatment Cleansed;Debridement (Selective)    Wound Properties Date First Assessed: 12/04/20 Time First Assessed: 4431 Wound Type: Venous stasis ulcer Location: Leg Location Orientation: Right Present on Admission: Yes   Dressing Type Impregnated gauze (bismuth)    Dressing Changed Changed    Dressing Status Old drainage    Dressing Change Frequency PRN    Site / Wound Assessment Granulation tissue    %  Wound base Red or Granulating 100%    % Wound base Yellow/Fibrinous Exudate 0%    Peri-wound Assessment Edema    Wound Length (cm) 1 cm    Wound Width (cm) 0.8 cm    Wound Depth (cm) 0.2 cm    Wound Volume (cm^3) 0.16 cm^3    Wound Surface Area (cm^2) 0.8 cm^2    Margins Attached edges (approximated)    Drainage Amount Scant    Drainage Description Serous    Treatment Cleansed;Debridement (Selective)    Selective Debridement - Location wound beds    Selective Debridement - Tools Used Scalpel    Selective Debridement - Tissue Removed slough    Wound Therapy - Clinical Statement Increased slough in superior wound bed starting at100%. Very painful to debride but able to remove enough slough to get to 75% slough. Changed superior wound back to Santyl secondary to reduction in granulation tissue. Continued with xeroform on inferior wound and gauze and coban. One short term goal met at this time and no long term goals met. Wounds are improving in granulation tissue and reducing in size. Will continue with current POC as tolerated by patient.    Wound Therapy - Functional Problem List gait and balance    Factors Delaying/Impairing Wound Healing Diabetes Mellitus;Vascular compromise;Altered sensation    Wound Therapy - Frequency 2X / week    Wound Plan continue with wound care    Dressing  inferior wounds  xeroform.  4x4 f/b sleeve used for lymphedema pt and coban.    Dressing superior wound: santyl, hydrogel, gauze, coban                       PT Short Term Goals - 02/25/21 1502       PT SHORT TERM GOAL #1   Title Patient will have at least 50% granualtion tissue.    Time 3    Period Weeks    Status Achieved    Target Date 12/25/20               PT Long Term Goals - 01/03/21 1344       PT LONG TERM GOAL #1   Title Patient's wound will be healed to reduce the risk of infection.    Time 6    Period Weeks  Status On-going      PT LONG TERM GOAL #2   Title  Patient will be able to verbalize completion of daily skin checks to reduce the risk of further wound development.    Time 6    Period Weeks    Status On-going                   Plan - 06/21/21 1641     Clinical Impression Statement see above    Personal Factors and Comorbidities Age;Comorbidity 3+;Past/Current Experience;Fitness;Time since onset of injury/illness/exacerbation    Comorbidities DM, CKD, neuorpathy, Hx CVA    Examination-Activity Limitations Locomotion Level;Transfers;Squat;Stand;Dressing    Examination-Participation Restrictions Community Activity    Stability/Clinical Decision Making Stable/Uncomplicated    Rehab Potential Good    PT Frequency 2x / week    PT Duration 8 weeks   increased time secondary to slow healing wound   PT Treatment/Interventions Gait training;Stair training;Functional mobility training;DME Instruction;Therapeutic activities;Therapeutic exercise;Balance training;Neuromuscular re-education;Patient/family education;Compression bandaging;Manual lymph drainage;Manual techniques    PT Next Visit Plan Continue with both chemical and mechanical debridement to remove adherent eschar to allow healing to occur    Consulted and Agree with Plan of Care Patient;Family member/caregiver    Family Member Consulted wife             Patient will benefit from skilled therapeutic intervention in order to improve the following deficits and impairments:  Abnormal gait, Decreased skin integrity, Decreased mobility, Impaired sensation  Visit Diagnosis: Ulcer of right pretibial region, limited to breakdown of skin (HCC)  Other abnormalities of gait and mobility     Problem List Patient Active Problem List   Diagnosis Date Noted   Advanced nonexudative age-related macular degeneration of right eye without subfoveal involvement 11/15/2020   Advanced nonexudative age-related macular degeneration of left eye without subfoveal involvement 11/15/2020    Diabetes mellitus without complication ([facility]) 61/95/0932   Posterior vitreous detachment of both eyes 11/15/2020   Gait disturbance, post-stroke 11/02/2017   Aphasia as late effect of cerebrovascular accident 11/02/2017   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   Aphasia    PAF (paroxysmal atrial fibrillation) ([place])    Diabetes mellitus type 2 in obese ([place])    Benign essential HTN    Hypothyroidism    Hyperlipidemia    Stroke (cerebrum) ([place]) 10/02/2017   Afib ([facility]) 01/10/2017   SOB (shortness of breath) 67/09/4579   Acute diastolic CHF (congestive heart failure) ([place]) 01/10/2017   Atrial fibrillation with RVR ([place]) 01/10/2017   Claudication ([place]) 07/25/2016   Paroxysmal atrial fibrillation ([place]) 07/25/2016   Odynophagia 04/10/2014   Allergic rhinitis 03/23/2014   Chronic rhinitis 12/22/2013   Intrinsic asthma 07/15/2013   Constipation 08/22/2011   Obesity 09/20/2009   G E R D 11/29/2007   Celiac disease 11/29/2007   4:48 PM, 06/21/21 Jerene Pitch, DPT Physical Therapy with [facility]  [phone] office   [facility] [address] [place], [place], [address] Phone: [phone]   Fax:  [phone]  Name: [name] MRN: [id number] Date of Birth: Dec 10, 1928

## 2021-06-24 DIAGNOSIS — Z85820 Personal history of malignant melanoma of skin: Secondary | ICD-10-CM | POA: Diagnosis not present

## 2021-06-24 DIAGNOSIS — L570 Actinic keratosis: Secondary | ICD-10-CM | POA: Diagnosis not present

## 2021-06-24 DIAGNOSIS — X32XXXD Exposure to sunlight, subsequent encounter: Secondary | ICD-10-CM | POA: Diagnosis not present

## 2021-06-24 DIAGNOSIS — Z08 Encounter for follow-up examination after completed treatment for malignant neoplasm: Secondary | ICD-10-CM | POA: Diagnosis not present

## 2021-06-24 DIAGNOSIS — Z1283 Encounter for screening for malignant neoplasm of skin: Secondary | ICD-10-CM | POA: Diagnosis not present

## 2021-06-24 DIAGNOSIS — L308 Other specified dermatitis: Secondary | ICD-10-CM | POA: Diagnosis not present

## 2021-06-25 ENCOUNTER — Encounter (HOSPITAL_COMMUNITY): Payer: Self-pay | Admitting: Physical Therapy

## 2021-06-25 ENCOUNTER — Ambulatory Visit (HOSPITAL_COMMUNITY): Payer: PPO | Admitting: Physical Therapy

## 2021-06-25 ENCOUNTER — Other Ambulatory Visit: Payer: Self-pay

## 2021-06-25 DIAGNOSIS — R2689 Other abnormalities of gait and mobility: Secondary | ICD-10-CM | POA: Diagnosis not present

## 2021-06-25 DIAGNOSIS — D638 Anemia in other chronic diseases classified elsewhere: Secondary | ICD-10-CM | POA: Diagnosis not present

## 2021-06-25 DIAGNOSIS — E1129 Type 2 diabetes mellitus with other diabetic kidney complication: Secondary | ICD-10-CM | POA: Diagnosis not present

## 2021-06-25 DIAGNOSIS — N189 Chronic kidney disease, unspecified: Secondary | ICD-10-CM | POA: Diagnosis not present

## 2021-06-25 DIAGNOSIS — R809 Proteinuria, unspecified: Secondary | ICD-10-CM | POA: Diagnosis not present

## 2021-06-25 DIAGNOSIS — E211 Secondary hyperparathyroidism, not elsewhere classified: Secondary | ICD-10-CM | POA: Diagnosis not present

## 2021-06-25 DIAGNOSIS — L97811 Non-pressure chronic ulcer of other part of right lower leg limited to breakdown of skin: Secondary | ICD-10-CM

## 2021-06-25 DIAGNOSIS — E1122 Type 2 diabetes mellitus with diabetic chronic kidney disease: Secondary | ICD-10-CM | POA: Diagnosis not present

## 2021-06-25 DIAGNOSIS — I10 Essential (primary) hypertension: Secondary | ICD-10-CM | POA: Diagnosis not present

## 2021-06-25 NOTE — Therapy (Signed)
[facility] [place], [place], [address] Phone: [phone]   Fax:  [phone]  Wound Care Therapy  Patient Details  Name: [name] MRN: [id number] Date of Birth: 06/26/1929 Referring Provider (PT): [name]   Encounter Date: 06/25/2021   PT End of Session - 06/25/21 1550     Visit Number 107    Number of Visits 100    Date for PT Re-Evaluation 07/10/21    Authorization Type [employer] (no auth req, no visit limit)    Progress Note Due on Visit 92    PT Start Time 1447    PT Stop Time 1530    PT Time Calculation (min) 43 min    Activity Tolerance Patient tolerated treatment well    Behavior During Therapy [facility] for tasks assessed/performed             Past Medical History:  Diagnosis Date   Allergic rhinitis    Aortic stenosis    Asthmatic bronchitis    Celiac disease    Cellulitis    CKD (chronic kidney disease)    Diverticulosis    Fx. left wrist 1987   Gastric polyps    GERD (gastroesophageal reflux disease)    History of pneumonia    Hypertension    Hypothyroidism    Iron deficiency anemia    Melanoma ([place])    Neuropathy    PAF (paroxysmal atrial fibrillation) (HCC)    Prostate cancer ([place])    REM sleep behavior disorder    RLS (restless legs syndrome)    Sleep apnea    Spasticity    Stroke ([place]) 09/2017   Tremor    Type 2 diabetes mellitus ([place])     Past Surgical History:  Procedure Laterality Date   APPENDECTOMY  1978   CARPAL TUNNEL RELEASE Left    CATARACT EXTRACTION  03/2011   bilateral    CHOLECYSTECTOMY  2001   COLONOSCOPY W/ BIOPSIES  04/27/2008   diverticulosis, duodenitis   FOOT SURGERY     HERNIA REPAIR  1994   bilateral   KNEE SURGERY Bilateral    x 2   LUNG SURGERY  2001   for infection   PROSTATECTOMY  2002   UPPER GASTROINTESTINAL ENDOSCOPY  04/27/2008   celiac disease, gastric polyps    There were no vitals filed for this  visit.               Wound Therapy - 06/25/21 0001     Subjective Patient states shin was sore last night and this morning    Patient and Family Stated Goals wound to heal    Date of Onset 09/06/20    Prior Treatments MD and self care    Pain Score 7     Evaluation and Treatment Procedures Explained to Patient/Family Yes    Evaluation and Treatment Procedures agreed to    Wound Properties Date First Assessed: 04/26/21 Time First Assessed: 9379 Wound Type: Non-pressure wound Location: Leg Location Orientation: Right Wound Description (Comments): superior to original wound Present on Admission: No   Wound Image Images linked: 1    Dressing Type Gauze (Comment)    Dressing Changed Changed    Dressing Status Old drainage    Dressing Change Frequency PRN    Site / Wound Assessment Red;Pink;Yellow    % Wound base Red or Granulating 50%    % Wound base Yellow/Fibrinous Exudate 50%    Peri-wound Assessment Erythema (blanchable);Edema  Drainage Amount Scant    Drainage Description Serous    Treatment Cleansed;Debridement (Selective)    Wound Properties Date First Assessed: 12/04/20 Time First Assessed: 3220 Wound Type: Venous stasis ulcer Location: Leg Location Orientation: Right Present on Admission: Yes   Wound Image Images linked: 2    Dressing Type Impregnated gauze (bismuth)    Dressing Changed Changed    Dressing Status Old drainage    Dressing Change Frequency PRN    Site / Wound Assessment Granulation tissue    % Wound base Red or Granulating 100%    % Wound base Yellow/Fibrinous Exudate 0%    Peri-wound Assessment Edema    Margins Attached edges (approximated)    Drainage Amount Scant    Drainage Description Serous    Treatment Cleansed;Debridement (Selective)    Selective Debridement - Location wound beds    Selective Debridement - Tools Used Scalpel    Selective Debridement - Tissue Removed slough    Wound Therapy - Clinical Statement Patient with increased  redness throughout R shin which may be due to sink irritation from kerlix. Patient with decreasing slough in superior wound bed following debridement. Wounds appear to be healing well. Continued with santyl to superior wound with hydrogel. Xeroform to inferior wound. Then placed lymph liner to hopefully decrease irritation, then kerlix and coban with #5 on top. Patient will continue to benefit from PT to promote wound healing.    Wound Therapy - Functional Problem List gait and balance    Factors Delaying/Impairing Wound Healing Diabetes Mellitus;Vascular compromise;Altered sensation    Wound Therapy - Frequency 2X / week    Wound Plan continue with wound care    Dressing  inferior wounds  xeroform.  4x4 f/b sleeve used for lymphedema pt and coban.    Dressing superior wound: santyl, hydrogel, gauze, lymph liner, kerlix, coban                       PT Short Term Goals - 02/25/21 1502       PT SHORT TERM GOAL #1   Title Patient will have at least 50% granualtion tissue.    Time 3    Period Weeks    Status Achieved    Target Date 12/25/20               PT Long Term Goals - 01/03/21 1344       PT LONG TERM GOAL #1   Title Patient's wound will be healed to reduce the risk of infection.    Time 6    Period Weeks    Status On-going      PT LONG TERM GOAL #2   Title Patient will be able to verbalize completion of daily skin checks to reduce the risk of further wound development.    Time 6    Period Weeks    Status On-going                   Plan - 06/25/21 1551     Clinical Impression Statement see above    Personal Factors and Comorbidities Age;Comorbidity 3+;Past/Current Experience;Fitness;Time since onset of injury/illness/exacerbation    Comorbidities DM, CKD, neuorpathy, Hx CVA    Examination-Activity Limitations Locomotion Level;Transfers;Squat;Stand;Dressing    Examination-Participation Restrictions Community Activity    Stability/Clinical  Decision Making Stable/Uncomplicated    Rehab Potential Good    PT Frequency 2x / week    PT Duration 8 weeks   increased time secondary to  slow healing wound   PT Treatment/Interventions Gait training;Stair training;Functional mobility training;DME Instruction;Therapeutic activities;Therapeutic exercise;Balance training;Neuromuscular re-education;Patient/family education;Compression bandaging;Manual lymph drainage;Manual techniques    PT Next Visit Plan Continue with both chemical and mechanical debridement to remove adherent eschar to allow healing to occur    Consulted and Agree with Plan of Care Patient;Family member/caregiver    Family Member Consulted wife             Patient will benefit from skilled therapeutic intervention in order to improve the following deficits and impairments:  Abnormal gait, Decreased skin integrity, Decreased mobility, Impaired sensation  Visit Diagnosis: Ulcer of right pretibial region, limited to breakdown of skin (HCC)  Other abnormalities of gait and mobility     Problem List Patient Active Problem List   Diagnosis Date Noted   Advanced nonexudative age-related macular degeneration of right eye without subfoveal involvement 11/15/2020   Advanced nonexudative age-related macular degeneration of left eye without subfoveal involvement 11/15/2020   Diabetes mellitus without complication ([place]) 00/76/2263   Posterior vitreous detachment of both eyes 11/15/2020   Gait disturbance, post-stroke 11/02/2017   Aphasia as late effect of cerebrovascular accident 11/02/2017   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   Aphasia    PAF (paroxysmal atrial fibrillation) ([place])    Diabetes mellitus type 2 in obese ([place])    Benign essential HTN    Hypothyroidism    Hyperlipidemia    Stroke (cerebrum) ([place]) 10/02/2017   Afib ([place]) 01/10/2017   SOB (shortness of breath) 33/54/5625   Acute diastolic CHF (congestive heart failure) ([place]) 01/10/2017    Atrial fibrillation with RVR ([place]) 01/10/2017   Claudication ([place]) 07/25/2016   Paroxysmal atrial fibrillation ([place]) 07/25/2016   Odynophagia 04/10/2014   Allergic rhinitis 03/23/2014   Chronic rhinitis 12/22/2013   Intrinsic asthma 07/15/2013   Constipation 08/22/2011   Obesity 09/20/2009   G E R D 11/29/2007   Celiac disease 11/29/2007   3:58 PM, 06/25/21 Mearl Latin PT, DPT Physical Therapist at [facility] [place], [place], [address] Phone: [phone]   Fax:  [phone]  Name: [name] MRN: [id number] Date of Birth: May 07, 1929

## 2021-06-28 ENCOUNTER — Ambulatory Visit (HOSPITAL_COMMUNITY): Payer: PPO | Admitting: Physical Therapy

## 2021-06-28 ENCOUNTER — Other Ambulatory Visit: Payer: Self-pay

## 2021-06-28 DIAGNOSIS — D638 Anemia in other chronic diseases classified elsewhere: Secondary | ICD-10-CM | POA: Diagnosis not present

## 2021-06-28 DIAGNOSIS — R2689 Other abnormalities of gait and mobility: Secondary | ICD-10-CM

## 2021-06-28 DIAGNOSIS — R600 Localized edema: Secondary | ICD-10-CM | POA: Diagnosis not present

## 2021-06-28 DIAGNOSIS — E211 Secondary hyperparathyroidism, not elsewhere classified: Secondary | ICD-10-CM | POA: Diagnosis not present

## 2021-06-28 DIAGNOSIS — L97811 Non-pressure chronic ulcer of other part of right lower leg limited to breakdown of skin: Secondary | ICD-10-CM

## 2021-06-28 DIAGNOSIS — R809 Proteinuria, unspecified: Secondary | ICD-10-CM | POA: Diagnosis not present

## 2021-06-28 DIAGNOSIS — E1122 Type 2 diabetes mellitus with diabetic chronic kidney disease: Secondary | ICD-10-CM | POA: Diagnosis not present

## 2021-06-28 DIAGNOSIS — N189 Chronic kidney disease, unspecified: Secondary | ICD-10-CM | POA: Diagnosis not present

## 2021-06-28 DIAGNOSIS — E1129 Type 2 diabetes mellitus with other diabetic kidney complication: Secondary | ICD-10-CM | POA: Diagnosis not present

## 2021-06-28 DIAGNOSIS — I10 Essential (primary) hypertension: Secondary | ICD-10-CM | POA: Diagnosis not present

## 2021-06-28 NOTE — Therapy (Signed)
[place], [place], [address] Phone: [phone]   Fax:  [phone]  Wound Care Therapy  Patient Details  Name: [name] MRN: [id number] Date of Birth: [birth year] Referring Provider (PT): [name]   Encounter Date: 06/28/2021   PT End of Session - 06/28/21 1315     Visit Number 68    Number of Visits 11    Date for PT Re-Evaluation 07/10/21    Authorization Type [employer] (no auth req, no visit limit)    Progress Note Due on Visit 73             Past Medical History:  Diagnosis Date   Allergic rhinitis    Aortic stenosis    Asthmatic bronchitis    Celiac disease    Cellulitis    CKD (chronic kidney disease)    Diverticulosis    Fx. left wrist 1987   Gastric polyps    GERD (gastroesophageal reflux disease)    History of pneumonia    Hypertension    Hypothyroidism    Iron deficiency anemia    Melanoma ([place])    Neuropathy    PAF (paroxysmal atrial fibrillation) ([place])    Prostate cancer ([place])    REM sleep behavior disorder    RLS (restless legs syndrome)    Sleep apnea    Spasticity    Stroke ([place]) 09/2017   Tremor    Type 2 diabetes mellitus ([place])     Past Surgical History:  Procedure Laterality Date   APPENDECTOMY  1978   CARPAL TUNNEL RELEASE Left    CATARACT EXTRACTION  03/2011   bilateral    CHOLECYSTECTOMY  2001   COLONOSCOPY W/ BIOPSIES  04/27/2008   diverticulosis, duodenitis   FOOT SURGERY     HERNIA REPAIR  1994   bilateral   KNEE SURGERY Bilateral    x 2   LUNG SURGERY  2001   for infection   PROSTATECTOMY  2002   UPPER GASTROINTESTINAL ENDOSCOPY  04/27/2008   celiac disease, gastric polyps    There were no vitals filed for this visit.               Wound Therapy - 06/28/21 0001     Subjective PT states he had some pain last night but he feels good today    Patient and Family Stated Goals wound to heal    Date of Onset 09/06/20     Prior Treatments MD and self care    Pain Score 0-No pain    Evaluation and Treatment Procedures Explained to Patient/Family Yes    Evaluation and Treatment Procedures agreed to    Wound Properties Date First Assessed: 04/26/21 Time First Assessed: 6269 Wound Type: Non-pressure wound Location: Leg Location Orientation: Right Wound Description (Comments): superior to original wound Present on Admission: No   Dressing Type Cotton Balls;Gauze (Comment);Impregnated gauze (bismuth)    Dressing Changed Changed    Dressing Status Old drainage    Dressing Change Frequency PRN    Site / Wound Assessment Painful;Yellow;Red    % Wound base Red or Granulating 30%    % Wound base Yellow/Fibrinous Exudate 70%    Peri-wound Assessment Erythema (blanchable);Edema    Wound Length (cm) 1.2 cm    Wound Width (cm) 1.2 cm    Wound Surface Area (cm^2) 1.44 cm^2    Drainage Amount Minimal    Drainage Description Serous    Wound Properties Date First  Assessed: 12/04/20 Time First Assessed: 1545 Wound Type: Venous stasis ulcer Location: Leg Location Orientation: Right Present on Admission: Yes   Dressing Type Impregnated gauze (bismuth)    Dressing Changed Changed    Dressing Status Old drainage    Dressing Change Frequency PRN    Site / Wound Assessment Granulation tissue    % Wound base Red or Granulating 100%    Peri-wound Assessment Edema    Wound Length (cm) 0.7 cm    Wound Width (cm) 0.5 cm    Wound Surface Area (cm^2) 0.35 cm^2    Drainage Amount Scant    Drainage Description Serous    Treatment Cleansed    Selective Debridement - Location wound beds    Selective Debridement - Tools Used Scalpel;Forceps    Selective Debridement - Tissue Removed slough    Wound Therapy - Clinical Statement PT has increased redness and edema in LE, therapist changed dressing on suprior wound to honey, lower wound to hydrogel followe by 2x2 and profore light compression dressing.  LE was very dry and irritated, gently  cleansed entire LE followed by vaseline and xeroform prior to applicaton of profore lite.    Wound Therapy - Functional Problem List gait and balance    Factors Delaying/Impairing Wound Healing Diabetes Mellitus;Vascular compromise;Altered sensation    Wound Therapy - Frequency 2X / week    Wound Plan assess how profore lite felt for pt and affected wounds.    Dressing  inferior wound hydrogel, 2x2 and proforelite    Dressing superior wound: medihonye, 2x2 and profore lite.                       PT Short Term Goals - 06/28/21 1316       PT SHORT TERM GOAL #1   Title Patient will have at least 50% granualtion tissue.    Time 3    Period Weeks    Status Achieved    Target Date 12/25/20               PT Long Term Goals - 06/28/21 1316       PT LONG TERM GOAL #1   Title Patient's wound will be healed to reduce the risk of infection.    Time 6    Period Weeks    Status On-going      PT LONG TERM GOAL #2   Title Patient will be able to verbalize completion of daily skin checks to reduce the risk of further wound development.    Time 6    Period Weeks    Status On-going                   Plan - 06/28/21 1316     Clinical Impression Statement see above    Personal Factors and Comorbidities Age;Comorbidity 3+;Past/Current Experience;Fitness;Time since onset of injury/illness/exacerbation    Comorbidities DM, CKD, neuorpathy, Hx CVA    Examination-Activity Limitations Locomotion Level;Transfers;Squat;Stand;Dressing    Examination-Participation Restrictions Community Activity    Stability/Clinical Decision Making Stable/Uncomplicated    Rehab Potential Good    PT Frequency 2x / week    PT Treatment/Interventions Gait training;Stair training;Functional mobility training;DME Instruction;Therapeutic activities;Therapeutic exercise;Balance training;Neuromuscular re-education;Patient/family education;Compression bandaging;Manual lymph drainage;Manual  techniques    PT Next Visit Plan Continue with both chemical and mechanical debridement to remove adherent eschar to allow healing to occur             Patient will benefit from skilled  therapeutic intervention in order to improve the following deficits and impairments:  Abnormal gait, Decreased skin integrity, Decreased mobility, Impaired sensation  Visit Diagnosis: Ulcer of right pretibial region, limited to breakdown of skin ([place])  Other abnormalities of gait and mobility     Problem List Patient Active Problem List   Diagnosis Date Noted   Advanced nonexudative age-related macular degeneration of right eye without subfoveal involvement 11/15/2020   Advanced nonexudative age-related macular degeneration of left eye without subfoveal involvement 11/15/2020   Diabetes mellitus without complication ([place]) 97/28/2060   Posterior vitreous detachment of both eyes 11/15/2020   Gait disturbance, post-stroke 11/02/2017   Aphasia as late effect of cerebrovascular accident 11/02/2017   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   Aphasia    PAF (paroxysmal atrial fibrillation) ([place])    Diabetes mellitus type 2 in obese ([place])    Benign essential HTN    Hypothyroidism    Hyperlipidemia    Stroke (cerebrum) ([place]) 10/02/2017   Afib ([place]) 01/10/2017   SOB (shortness of breath) 15/61/5379   Acute diastolic CHF (congestive heart failure) ([place]) 01/10/2017   Atrial fibrillation with RVR ([place]) 01/10/2017   Claudication ([place]) 07/25/2016   Paroxysmal atrial fibrillation ([place]) 07/25/2016   Odynophagia 04/10/2014   Allergic rhinitis 03/23/2014   Chronic rhinitis 12/22/2013   Intrinsic asthma 07/15/2013   Constipation 08/22/2011   Obesity 09/20/2009   G E R D 11/29/2007   Celiac disease 11/29/2007   Rayetta Humphrey, PT CLT [phone]  06/28/2021, 2:56 PM  [place], [place], [address] Phone: [phone]    Fax:  [phone]  Name: [name] MRN: [id number] Date of Birth: 1929-02-02

## 2021-07-02 ENCOUNTER — Ambulatory Visit (HOSPITAL_COMMUNITY): Payer: PPO

## 2021-07-02 ENCOUNTER — Other Ambulatory Visit: Payer: Self-pay

## 2021-07-02 DIAGNOSIS — L97811 Non-pressure chronic ulcer of other part of right lower leg limited to breakdown of skin: Secondary | ICD-10-CM

## 2021-07-02 DIAGNOSIS — R2689 Other abnormalities of gait and mobility: Secondary | ICD-10-CM | POA: Diagnosis not present

## 2021-07-02 NOTE — Therapy (Signed)
[place], [place], [address] Phone: [phone]   Fax:  [phone]  Wound Care Therapy  Patient Details  Name: [name] MRN: [id number] Date of Birth: 02/27/1929 Referring Provider (PT): [name]   Encounter Date: 07/02/2021   PT End of Session - 07/02/21 1533     Visit Number 68    Number of Visits 77    Date for PT Re-Evaluation 07/10/21    Authorization Type [employer] (no auth req, no visit limit)    Progress Note Due on Visit 73    PT Start Time 1446    PT Stop Time 1526    PT Time Calculation (min) 40 min    Activity Tolerance Patient tolerated treatment well    Behavior During Therapy [facility] for tasks assessed/performed             Past Medical History:  Diagnosis Date   Allergic rhinitis    Aortic stenosis    Asthmatic bronchitis    Celiac disease    Cellulitis    CKD (chronic kidney disease)    Diverticulosis    Fx. left wrist 1987   Gastric polyps    GERD (gastroesophageal reflux disease)    History of pneumonia    Hypertension    Hypothyroidism    Iron deficiency anemia    Melanoma ([place])    Neuropathy    PAF (paroxysmal atrial fibrillation) ([place])    Prostate cancer ([place])    REM sleep behavior disorder    RLS (restless legs syndrome)    Sleep apnea    Spasticity    Stroke ([place]) 09/2017   Tremor    Type 2 diabetes mellitus ([place])     Past Surgical History:  Procedure Laterality Date   APPENDECTOMY  1978   CARPAL TUNNEL RELEASE Left    CATARACT EXTRACTION  03/2011   bilateral    CHOLECYSTECTOMY  2001   COLONOSCOPY W/ BIOPSIES  04/27/2008   diverticulosis, duodenitis   FOOT SURGERY     HERNIA REPAIR  1994   bilateral   KNEE SURGERY Bilateral    x 2   LUNG SURGERY  2001   for infection   PROSTATECTOMY  2002   UPPER GASTROINTESTINAL ENDOSCOPY  04/27/2008   celiac disease, gastric polyps    There were no vitals filed for this visit.    Subjective  Assessment - 07/02/21 1532     Subjective Feeling good today, no reports of pain.    Currently in Pain? No/denies                       Wound Therapy - 07/02/21 0001     Subjective Feeling good today, no reports of pain.    Patient and Family Stated Goals wound to heal    Date of Onset 09/06/20    Prior Treatments MD and self care    Pain Scale 0-10    Pain Score 0-No pain    Evaluation and Treatment Procedures Explained to Patient/Family Yes    Evaluation and Treatment Procedures agreed to    Wound Properties Date First Assessed: 04/26/21 Time First Assessed: 5465 Wound Type: Non-pressure wound Location: Leg Location Orientation: Right Wound Description (Comments): superior to original wound Present on Admission: No   Wound Image Images linked: 1    Dressing Type Gauze (Comment)   superior wound: medihonye, 2x2 and profore lite.   Dressing Changed Changed  Dressing Status Old drainage    Dressing Change Frequency PRN    Site / Wound Assessment Painful;Red;Yellow    % Wound base Red or Granulating 30%    % Wound base Yellow/Fibrinous Exudate 70%    Peri-wound Assessment Erythema (blanchable)    Drainage Amount Scant    Drainage Description Serous    Treatment Cleansed;Debridement (Selective)    Wound Properties Date First Assessed: 12/04/20 Time First Assessed: 1545 Wound Type: Venous stasis ulcer Location: Leg Location Orientation: Right Present on Admission: Yes   Wound Image Images linked: 1    Dressing Type Hydrogel;Impregnated gauze (bismuth)    Dressing Changed Changed    Dressing Status Old drainage    Dressing Change Frequency PRN    Site / Wound Assessment Granulation tissue   biofilm   % Wound base Red or Granulating 100%    % Wound base Yellow/Fibrinous Exudate 0%    Peri-wound Assessment Edema    Drainage Amount Scant    Drainage Description Serous    Treatment Cleansed;Debridement (Selective)    Selective Debridement - Location wound beds     Selective Debridement - Tools Used Scalpel;Forceps    Selective Debridement - Tissue Removed slough    Wound Therapy - Clinical Statement Perimeter with increased redness and edema present today.  Selective debridment for removal of biofilm over inferior wound bed and slough from superior wound to promote healing.  Continued with profore lite for edema control.    Wound Therapy - Functional Problem List gait and balance    Factors Delaying/Impairing Wound Healing Diabetes Mellitus;Vascular compromise;Altered sensation    Wound Therapy - Frequency 2X / week    Wound Plan continue appropriate wound care    Dressing  inferior wound hydrogel, 2x2 and proforelite    Dressing superior wound: medihonye, 2x2 and profore lite.                       PT Short Term Goals - 06/28/21 1316       PT SHORT TERM GOAL #1   Title Patient will have at least 50% granualtion tissue.    Time 3    Period Weeks    Status Achieved    Target Date 12/25/20               PT Long Term Goals - 06/28/21 1316       PT LONG TERM GOAL #1   Title Patient's wound will be healed to reduce the risk of infection.    Time 6    Period Weeks    Status On-going      PT LONG TERM GOAL #2   Title Patient will be able to verbalize completion of daily skin checks to reduce the risk of further wound development.    Time 6    Period Weeks    Status On-going                    Patient will benefit from skilled therapeutic intervention in order to improve the following deficits and impairments:     Visit Diagnosis: Other abnormalities of gait and mobility  Ulcer of right pretibial region, limited to breakdown of skin [facility])     Problem List Patient Active Problem List   Diagnosis Date Noted   Advanced nonexudative age-related macular degeneration of right eye without subfoveal involvement 11/15/2020   Advanced nonexudative age-related macular degeneration of left eye without subfoveal  involvement 11/15/2020   Diabetes  mellitus without complication ([place]) 08/29/3566   Posterior vitreous detachment of both eyes 11/15/2020   Gait disturbance, post-stroke 11/02/2017   Aphasia as late effect of cerebrovascular accident 11/02/2017   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   Aphasia    PAF (paroxysmal atrial fibrillation) ([place])    Diabetes mellitus type 2 in obese ([place])    Benign essential HTN    Hypothyroidism    Hyperlipidemia    Stroke (cerebrum) ([place]) 10/02/2017   Afib ([place]) 01/10/2017   SOB (shortness of breath) 01/41/0301   Acute diastolic CHF (congestive heart failure) ([place]) 01/10/2017   Atrial fibrillation with RVR ([place]) 01/10/2017   Claudication ([place]) 07/25/2016   Paroxysmal atrial fibrillation ([place]) 07/25/2016   Odynophagia 04/10/2014   Allergic rhinitis 03/23/2014   Chronic rhinitis 12/22/2013   Intrinsic asthma 07/15/2013   Constipation 08/22/2011   Obesity 09/20/2009   G E R D 11/29/2007   Celiac disease 11/29/2007   Ihor Austin, LPTA/CLT; CBIS [phone]  Aldona Lento 07/02/2021, 5:38 PM  [place], [place], [address] Phone: [phone]   Fax:  [phone]  Name: [name] MRN: [id number] Date of Birth: September 28, 1929

## 2021-07-05 ENCOUNTER — Ambulatory Visit (HOSPITAL_COMMUNITY): Payer: PPO

## 2021-07-05 ENCOUNTER — Other Ambulatory Visit: Payer: Self-pay

## 2021-07-05 ENCOUNTER — Encounter (HOSPITAL_COMMUNITY): Payer: Self-pay

## 2021-07-05 DIAGNOSIS — L97811 Non-pressure chronic ulcer of other part of right lower leg limited to breakdown of skin: Secondary | ICD-10-CM

## 2021-07-05 DIAGNOSIS — R2689 Other abnormalities of gait and mobility: Secondary | ICD-10-CM

## 2021-07-05 NOTE — Therapy (Signed)
[place], [place], [address] Phone: [phone]   Fax:  [phone]  Wound Care Therapy  Patient Details  Name: [name] MRN: [id number] Date of Birth: [birth year] Referring Provider (PT): [name]   Encounter Date: 07/05/2021   PT End of Session - 07/05/21 1821     Visit Number 71    Number of Visits 85    Date for PT Re-Evaluation 07/10/21    Authorization Type [employer] (no auth req, no visit limit)    Progress Note Due on Visit 73    PT Start Time 1447    PT Stop Time 1526    PT Time Calculation (min) 39 min    Activity Tolerance Patient tolerated treatment well    Behavior During Therapy WFL for tasks assessed/performed             Past Medical History:  Diagnosis Date   Allergic rhinitis    Aortic stenosis    Asthmatic bronchitis    Celiac disease    Cellulitis    CKD (chronic kidney disease)    Diverticulosis    Fx. left wrist 1987   Gastric polyps    GERD (gastroesophageal reflux disease)    History of pneumonia    Hypertension    Hypothyroidism    Iron deficiency anemia    Melanoma ([place])    Neuropathy    PAF (paroxysmal atrial fibrillation) ([place])    Prostate cancer ([place])    REM sleep behavior disorder    RLS (restless legs syndrome)    Sleep apnea    Spasticity    Stroke ([place]) 09/2017   Tremor    Type 2 diabetes mellitus ([place])     Past Surgical History:  Procedure Laterality Date   APPENDECTOMY  1978   CARPAL TUNNEL RELEASE Left    CATARACT EXTRACTION  03/2011   bilateral    CHOLECYSTECTOMY  2001   COLONOSCOPY W/ BIOPSIES  04/27/2008   diverticulosis, duodenitis   FOOT SURGERY     HERNIA REPAIR  1994   bilateral   KNEE SURGERY Bilateral    x 2   LUNG SURGERY  2001   for infection   PROSTATECTOMY  2002   UPPER GASTROINTESTINAL ENDOSCOPY  04/27/2008   celiac disease, gastric polyps    There were no vitals filed for this visit.    Subjective  Assessment - 07/05/21 1814     Subjective Feeling good today, no reports of pain.    Currently in Pain? No/denies                       Wound Therapy - 07/05/21 0001     Subjective Feeling good today, no reports of pain.    Patient and Family Stated Goals wound to heal    Date of Onset 09/06/20    Prior Treatments MD and self care    Pain Scale 0-10    Pain Score 0-No pain    Evaluation and Treatment Procedures Explained to Patient/Family Yes    Evaluation and Treatment Procedures agreed to    Wound Properties Date First Assessed: 04/26/21 Time First Assessed: 8413 Wound Type: Non-pressure wound Location: Leg Location Orientation: Right Wound Description (Comments): superior to original wound Present on Admission: No   Wound Image Images linked: 1    Dressing Type Gauze (Comment);Compression wrap   Medihoney, gauze, lymphedema liner, profore lite   Dressing Changed Changed  Dressing Status Old drainage    Dressing Change Frequency PRN    Site / Wound Assessment Painful;Red;Yellow    % Wound base Red or Granulating 35%    % Wound base Yellow/Fibrinous Exudate 65%    Peri-wound Assessment Erythema (blanchable)    Wound Length (cm) 3 cm    Wound Width (cm) 1.7 cm    Wound Surface Area (cm^2) 5.1 cm^2    Drainage Amount Scant    Drainage Description Serous    Treatment Cleansed;Debridement (Selective)    Wound Properties Date First Assessed: 12/04/20 Time First Assessed: 1545 Wound Type: Venous stasis ulcer Location: Leg Location Orientation: Right Present on Admission: Yes   Wound Image Images linked: 1    Dressing Type Hydrogel;Impregnated gauze (bismuth);Compression wrap   hydrogel, xeroform, gauze, lymphedema liner, profore lite   Dressing Changed Changed    Dressing Change Frequency PRN    Site / Wound Assessment Granulation tissue    % Wound base Red or Granulating 100%   following debridement   % Wound base Yellow/Fibrinous Exudate 0%    Peri-wound  Assessment Edema    Drainage Amount Scant    Drainage Description Serous    Treatment Cleansed;Debridement (Selective)    Selective Debridement - Location wound beds    Selective Debridement - Tools Used Scalpel;Forceps    Selective Debridement - Tissue Removed slough    Wound Therapy - Clinical Statement Overall reduction in edema.  Increased ease for removal of slough from superior wound, noted increased in size following debridement.  Continued with medihoney superior wound and hydrogel/xerofrom inferior wound.  Rt LE continues to be red and significant amount of dry skin that was removed as time allowed.    Wound Therapy - Functional Problem List gait and balance    Factors Delaying/Impairing Wound Healing Diabetes Mellitus;Vascular compromise;Altered sensation    Wound Therapy - Frequency 2X / week    Wound Plan continue appropriate wound care    Dressing  inferior wound hydrogel, 2x2 and proforelite    Dressing superior wound: medihonye, 2x2 and profore lite.                       PT Short Term Goals - 06/28/21 1316       PT SHORT TERM GOAL #1   Title Patient will have at least 50% granualtion tissue.    Time 3    Period Weeks    Status Achieved    Target Date 12/25/20               PT Long Term Goals - 06/28/21 1316       PT LONG TERM GOAL #1   Title Patient's wound will be healed to reduce the risk of infection.    Time 6    Period Weeks    Status On-going      PT LONG TERM GOAL #2   Title Patient will be able to verbalize completion of daily skin checks to reduce the risk of further wound development.    Time 6    Period Weeks    Status On-going                    Patient will benefit from skilled therapeutic intervention in order to improve the following deficits and impairments:     Visit Diagnosis: Ulcer of right pretibial region, limited to breakdown of skin ([facility])  Other abnormalities of gait and mobility     Problem  List Patient Active Problem List   Diagnosis Date Noted   Advanced nonexudative age-related macular degeneration of right eye without subfoveal involvement 11/15/2020   Advanced nonexudative age-related macular degeneration of left eye without subfoveal involvement 11/15/2020   Diabetes mellitus without complication ([place]) 31/09/1623   Posterior vitreous detachment of both eyes 11/15/2020   Gait disturbance, post-stroke 11/02/2017   Aphasia as late effect of cerebrovascular accident 11/02/2017   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   Aphasia    PAF (paroxysmal atrial fibrillation) ([place])    Diabetes mellitus type 2 in obese ([place])    Benign essential HTN    Hypothyroidism    Hyperlipidemia    Stroke (cerebrum) ([place]) 10/02/2017   Afib ([place]) 01/10/2017   SOB (shortness of breath) 46/95/0722   Acute diastolic CHF (congestive heart failure) ([place]) 01/10/2017   Atrial fibrillation with RVR ([place]) 01/10/2017   Claudication ([place]) 07/25/2016   Paroxysmal atrial fibrillation ([place]) 07/25/2016   Odynophagia 04/10/2014   Allergic rhinitis 03/23/2014   Chronic rhinitis 12/22/2013   Intrinsic asthma 07/15/2013   Constipation 08/22/2011   Obesity 09/20/2009   G E R D 11/29/2007   Celiac disease 11/29/2007   Ihor Austin, LPTA/CLT; CBIS [phone]  Aldona Lento 07/05/2021, 6:22 PM  [place], [place], [address] Phone: [phone]   Fax:  [phone]  Name: [name] MRN: [id number] Date of Birth: 1928/12/11

## 2021-07-09 ENCOUNTER — Ambulatory Visit (HOSPITAL_COMMUNITY): Payer: PPO

## 2021-07-09 ENCOUNTER — Encounter (HOSPITAL_COMMUNITY): Payer: Self-pay

## 2021-07-09 ENCOUNTER — Other Ambulatory Visit: Payer: Self-pay

## 2021-07-09 DIAGNOSIS — E1142 Type 2 diabetes mellitus with diabetic polyneuropathy: Secondary | ICD-10-CM | POA: Diagnosis not present

## 2021-07-09 DIAGNOSIS — R2689 Other abnormalities of gait and mobility: Secondary | ICD-10-CM | POA: Diagnosis not present

## 2021-07-09 DIAGNOSIS — B351 Tinea unguium: Secondary | ICD-10-CM | POA: Diagnosis not present

## 2021-07-09 DIAGNOSIS — L97811 Non-pressure chronic ulcer of other part of right lower leg limited to breakdown of skin: Secondary | ICD-10-CM

## 2021-07-09 NOTE — Therapy (Signed)
[place], [place], [address] Phone: [phone]   Fax:  [phone]  Wound Care Therapy  Patient Details  Name: [name] MRN: [id number] Date of Birth: 05-Aug-1929 Referring Provider (PT): [name]   Encounter Date: 07/09/2021   PT End of Session - 07/09/21 1540     Visit Number 72    Number of Visits 93    Date for PT Re-Evaluation 07/10/21    Authorization Type [employer] (no auth req, no visit limit)    Progress Note Due on Visit 73    PT Start Time 1450    PT Stop Time 1528    PT Time Calculation (min) 38 min    Activity Tolerance Patient tolerated treatment well    Behavior During Therapy WFL for tasks assessed/performed             Past Medical History:  Diagnosis Date   Allergic rhinitis    Aortic stenosis    Asthmatic bronchitis    Celiac disease    Cellulitis    CKD (chronic kidney disease)    Diverticulosis    Fx. left wrist 1987   Gastric polyps    GERD (gastroesophageal reflux disease)    History of pneumonia    Hypertension    Hypothyroidism    Iron deficiency anemia    Melanoma ([place])    Neuropathy    PAF (paroxysmal atrial fibrillation) ([place])    Prostate cancer ([place])    REM sleep behavior disorder    RLS (restless legs syndrome)    Sleep apnea    Spasticity    Stroke ([place]) 09/2017   Tremor    Type 2 diabetes mellitus ([place])     Past Surgical History:  Procedure Laterality Date   APPENDECTOMY  1978   CARPAL TUNNEL RELEASE Left    CATARACT EXTRACTION  03/2011   bilateral    CHOLECYSTECTOMY  2001   COLONOSCOPY W/ BIOPSIES  04/27/2008   diverticulosis, duodenitis   FOOT SURGERY     HERNIA REPAIR  1994   bilateral   KNEE SURGERY Bilateral    x 2   LUNG SURGERY  2001   for infection   PROSTATECTOMY  2002   UPPER GASTROINTESTINAL ENDOSCOPY  04/27/2008   celiac disease, gastric polyps    There were no vitals filed for this  visit.               Wound Therapy - 07/09/21 0001     Subjective Feeling good today, no reports of pain.    Patient and Family Stated Goals wound to heal    Date of Onset 09/06/20    Prior Treatments MD and self care    Pain Scale 0-10    Pain Score 0-No pain    Evaluation and Treatment Procedures Explained to Patient/Family Yes    Evaluation and Treatment Procedures agreed to    Wound Properties Date First Assessed: 04/26/21 Time First Assessed: 1443 Wound Type: Non-pressure wound Location: Leg Location Orientation: Right Wound Description (Comments): superior to original wound Present on Admission: No   Wound Image Images linked: 1    Dressing Type Gauze (Comment)   Medihoney on 2x2, profore lite   Dressing Changed Changed    Dressing Change Frequency PRN    Site / Wound Assessment Painful;Yellow;Red    % Wound base Red or Granulating 35%    % Wound base Yellow/Fibrinous Exudate 65%    Peri-wound Assessment  Erythema (blanchable)    Wound Length (cm) 2.5 cm    Wound Width (cm) 1.5 cm    Wound Surface Area (cm^2) 3.75 cm^2    Drainage Amount Scant    Drainage Description Serous    Treatment Cleansed;Debridement (Selective)    Wound Properties Date First Assessed: 12/04/20 Time First Assessed: 4383 Wound Type: Venous stasis ulcer Location: Leg Location Orientation: Right Present on Admission: Yes   Wound Image Images linked: 1    Dressing Type Hydrogel;Impregnated gauze (bismuth);Gauze (Comment)   xeroform, hydrogel, 2x2, profore lite   Dressing Changed Changed    Dressing Change Frequency PRN    Site / Wound Assessment Granulation tissue    % Wound base Red or Granulating 100%   following debridement   % Wound base Yellow/Fibrinous Exudate 0%    Peri-wound Assessment Edema    Drainage Amount Scant    Drainage Description Serous    Treatment Cleansed;Debridement (Selective)    Selective Debridement - Location wound beds    Selective Debridement - Tools Used  Scalpel;Forceps    Selective Debridement - Tissue Removed slough    Wound Therapy - Clinical Statement Increased slough superior wound, selective debridement for removal of slough from wound bed to promote healing.  LE continues to have redness and significant amount of dry skin that was removed surrounding wound.  Continued with profore lite for edema control, medihoney superior wound and xeroform inferior.    Wound Therapy - Functional Problem List gait and balance    Factors Delaying/Impairing Wound Healing Diabetes Mellitus;Vascular compromise;Altered sensation    Wound Therapy - Frequency 2X / week    Wound Plan continue appropriate wound care    Dressing  inferior wound hydrogel, 2x2 and proforelite    Dressing superior wound: medihonye, 2x2 and profore lite.                       PT Short Term Goals - 06/28/21 1316       PT SHORT TERM GOAL #1   Title Patient will have at least 50% granualtion tissue.    Time 3    Period Weeks    Status Achieved    Target Date 12/25/20               PT Long Term Goals - 06/28/21 1316       PT LONG TERM GOAL #1   Title Patient's wound will be healed to reduce the risk of infection.    Time 6    Period Weeks    Status On-going      PT LONG TERM GOAL #2   Title Patient will be able to verbalize completion of daily skin checks to reduce the risk of further wound development.    Time 6    Period Weeks    Status On-going                    Patient will benefit from skilled therapeutic intervention in order to improve the following deficits and impairments:     Visit Diagnosis: Ulcer of right pretibial region, limited to breakdown of skin (HCC)  Other abnormalities of gait and mobility     Problem List Patient Active Problem List   Diagnosis Date Noted   Advanced nonexudative age-related macular degeneration of right eye without subfoveal involvement 11/15/2020   Advanced nonexudative age-related  macular degeneration of left eye without subfoveal involvement 11/15/2020   Diabetes mellitus without complication (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   Aphasia    PAF (paroxysmal atrial fibrillation) ([place])    Diabetes mellitus type 2 in obese ([place])    Benign essential HTN    Hypothyroidism    Hyperlipidemia    Stroke (cerebrum) ([place]) 10/02/2017   Afib ([place]) 01/10/2017   SOB (shortness of breath) 11/94/1740   Acute diastolic CHF (congestive heart failure) ([place]) 01/10/2017   Atrial fibrillation with RVR ([place]) 01/10/2017   Claudication ([place]) 07/25/2016   Paroxysmal atrial fibrillation ([place]) 07/25/2016   Odynophagia 04/10/2014   Allergic rhinitis 03/23/2014   Chronic rhinitis 12/22/2013   Intrinsic asthma 07/15/2013   Constipation 08/22/2011   Obesity 09/20/2009   G E R D 11/29/2007   Celiac disease 11/29/2007   Ihor Austin, LPTA/CLT; CBIS [phone]  Aldona Lento 07/09/2021, 5:40 PM  [place], [place], [address] Phone: [phone]   Fax:  [phone]  Name: [name] MRN: [id number] Date of Birth: [birth year]

## 2021-07-12 ENCOUNTER — Ambulatory Visit (HOSPITAL_COMMUNITY): Payer: PPO | Admitting: Physical Therapy

## 2021-07-12 ENCOUNTER — Encounter (HOSPITAL_COMMUNITY): Payer: Self-pay

## 2021-07-12 ENCOUNTER — Other Ambulatory Visit: Payer: Self-pay

## 2021-07-12 ENCOUNTER — Ambulatory Visit (HOSPITAL_COMMUNITY): Payer: PPO

## 2021-07-12 DIAGNOSIS — E1165 Type 2 diabetes mellitus with hyperglycemia: Secondary | ICD-10-CM | POA: Diagnosis not present

## 2021-07-12 DIAGNOSIS — L97811 Non-pressure chronic ulcer of other part of right lower leg limited to breakdown of skin: Secondary | ICD-10-CM

## 2021-07-12 DIAGNOSIS — R2689 Other abnormalities of gait and mobility: Secondary | ICD-10-CM

## 2021-07-12 DIAGNOSIS — E785 Hyperlipidemia, unspecified: Secondary | ICD-10-CM | POA: Diagnosis not present

## 2021-07-12 DIAGNOSIS — I10 Essential (primary) hypertension: Secondary | ICD-10-CM | POA: Diagnosis not present

## 2021-07-12 NOTE — Therapy (Addendum)
[place], [place], [address] Phone: [phone]   Fax:  [phone]  Wound Care Therapy  Patient Details  Name: [name] MRN: [id number] Date of Birth: Feb 16, 1929 Referring Provider (PT): [name]   Encounter Date: 07/12/2021   PT End of Session - 07/12/21 1446     Visit Number 44    Number of Visits 26   Date for PT Re-Evaluation 08/22/21    Authorization Type [employer] (no auth req, no visit limit)    Progress Note Due on Visit 85   PT Start Time 1316    PT Stop Time 1400    PT Time Calculation (min) 44 min    Activity Tolerance Patient tolerated treatment well    Behavior During Therapy [facility] for tasks assessed/performed             Past Medical History:  Diagnosis Date   Allergic rhinitis    Aortic stenosis    Asthmatic bronchitis    Celiac disease    Cellulitis    CKD (chronic kidney disease)    Diverticulosis    Fx. left wrist 1987   Gastric polyps    GERD (gastroesophageal reflux disease)    History of pneumonia    Hypertension    Hypothyroidism    Iron deficiency anemia    Melanoma ([place])    Neuropathy    PAF (paroxysmal atrial fibrillation) (HCC)    Prostate cancer ([place])    REM sleep behavior disorder    RLS (restless legs syndrome)    Sleep apnea    Spasticity    Stroke ([place]) 09/2017   Tremor    Type 2 diabetes mellitus ([place])     Past Surgical History:  Procedure Laterality Date   APPENDECTOMY  1978   CARPAL TUNNEL RELEASE Left    CATARACT EXTRACTION  03/2011   bilateral    CHOLECYSTECTOMY  2001   COLONOSCOPY W/ BIOPSIES  04/27/2008   diverticulosis, duodenitis   FOOT SURGERY     HERNIA REPAIR  1994   bilateral   KNEE SURGERY Bilateral    x 2   LUNG SURGERY  2001   for infection   PROSTATECTOMY  2002   UPPER GASTROINTESTINAL ENDOSCOPY  04/27/2008   celiac disease, gastric polyps    There were no vitals filed for this visit.    Subjective  Assessment - 07/12/21 1445     Subjective Pt arrived with wife stating new wound on Lt LE.  No reports of pain.  Does c/o pain at night on superior wound.    Patient is accompained by: Family member   wife [name]                      Wound Therapy - 07/12/21 0001     Subjective Pt arrived with wife stating new wound on Lt LE.  No reports of pain.  Does c/o pain at night on superior wound.    Patient and Family Stated Goals wound to heal    Date of Onset 09/06/20    Prior Treatments MD and self care    Pain Scale 0-10    Pain Score 0-No pain   pain scale 9/10 during debridement to superior wound   Evaluation and Treatment Procedures Explained to Patient/Family Yes    Evaluation and Treatment Procedures agreed to    Wound Properties Date First Assessed: 04/26/21 Time First Assessed: 1549 Wound Type: Non-pressure  wound Location: Leg Location Orientation: Right Wound Description (Comments): superior to original wound Present on Admission: No   Wound Image Images linked: 1    Dressing Type Gauze (Comment);Compression wrap   Medihoney, gauze, profore lite with lymph liner   Dressing Changed Changed    Dressing Change Frequency PRN    Site / Wound Assessment Painful;Yellow;Red    % Wound base Red or Granulating 35%    % Wound base Yellow/Fibrinous Exudate 65%    Peri-wound Assessment Erythema (blanchable)    Wound Length (cm) 2.6 cm    Wound Width (cm) 1.4 cm    Wound Depth (cm) 0 cm    Wound Volume (cm^3) 0 cm^3    Wound Surface Area (cm^2) 3.64 cm^2    Drainage Amount Minimal    Drainage Description Serous    Wound Properties Date First Assessed: 12/04/20 Time First Assessed: 2542 Wound Type: Venous stasis ulcer Location: Leg Location Orientation: Right Present on Admission: Yes   Wound Image Images linked: 1    Dressing Type Hydrogel;Impregnated gauze (bismuth)    Dressing Changed Changed    Dressing Change Frequency PRN    Site / Wound Assessment Granulation tissue     % Wound base Red or Granulating 100%    % Wound base Yellow/Fibrinous Exudate 0%    Peri-wound Assessment Edema    Wound Length (cm) 1.2 cm    Wound Width (cm) 1.5 cm    Wound Depth (cm) 0.2 cm    Wound Volume (cm^3) 0.36 cm^3    Wound Surface Area (cm^2) 1.8 cm^2    Drainage Amount Scant    Drainage Description Serous    Treatment Cleansed;Debridement (Selective)    Selective Debridement - Location wound beds    Selective Debridement - Tools Used Scalpel;Forceps    Selective Debridement - Tissue Removed slough    Wound Therapy - Clinical Statement Pt arrived with new skin tear distal Lt LE measuring W4.5x L4.8cm.  Called Dr. Juel Burrow office left message concerning new wound as well as faxed referral to begin.  Superior wound with increased slough that was easy to remove outer layer but adherent onto wound.  Continued with medihoney and profore lite for edema control.  Pt reports increased pain during debridement to superior wound that was resolved following treatment    Wound Therapy - Functional Problem List gait and balance    Factors Delaying/Impairing Wound Healing Diabetes Mellitus;Vascular compromise;Altered sensation    Wound Therapy - Frequency 2X / week    Wound Plan F/U with referral for Lt LE.  Continue appropriate wound care and dressings.    Dressing  inferior wound hydrogel, 2x2 and proforelite    Dressing superior wound: medihonye, 2x2 and profore lite.    Dressing Lt LE wound not treated, wife placed xeroform prior tx.  Replaced xeroform and kerlix.                       PT Short Term Goals - 07/12/21 1627       PT SHORT TERM GOAL #1   Title Patient will have at least 50% granualtion tissue.    Status Partially Met               PT Long Term Goals - 07/12/21 1627       PT LONG TERM GOAL #1   Title Patient's wound will be healed to reduce the risk of infection.    Status On-going  PT LONG TERM GOAL #2   Title Patient will be able to  verbalize completion of daily skin checks to reduce the risk of further wound development.    Status On-going                    Patient will benefit from skilled therapeutic intervention in order to improve the following deficits and impairments:     Visit Diagnosis: Ulcer of right pretibial region, limited to breakdown of skin ([place])  Other abnormalities of gait and mobility     Problem List Patient Active Problem List   Diagnosis Date Noted   Advanced nonexudative age-related macular degeneration of right eye without subfoveal involvement 11/15/2020   Advanced nonexudative age-related macular degeneration of left eye without subfoveal involvement 11/15/2020   Diabetes mellitus without complication ([place]) 32/91/9166   Posterior vitreous detachment of both eyes 11/15/2020   Gait disturbance, post-stroke 11/02/2017   Aphasia as late effect of cerebrovascular accident 11/02/2017   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   Aphasia    PAF (paroxysmal atrial fibrillation) ([place])    Diabetes mellitus type 2 in obese ([place])    Benign essential HTN    Hypothyroidism    Hyperlipidemia    Stroke (cerebrum) ([place]) 10/02/2017   Afib ([place]) 01/10/2017   SOB (shortness of breath) 06/00/4599   Acute diastolic CHF (congestive heart failure) ([place]) 01/10/2017   Atrial fibrillation with RVR ([place]) 01/10/2017   Claudication ([place]) 07/25/2016   Paroxysmal atrial fibrillation ([place]) 07/25/2016   Odynophagia 04/10/2014   Allergic rhinitis 03/23/2014   Chronic rhinitis 12/22/2013   Intrinsic asthma 07/15/2013   Constipation 08/22/2011   Obesity 09/20/2009   G E R D 11/29/2007   Celiac disease 11/29/2007   Ihor Austin, LPTA/CLT; CBIS [place], PT CLT [phone]  07/12/2021, 4:27 PM  [place], [place], [address] Phone: [phone]   Fax:  [phone]  Name: VERLIN UHER MRN: [id number] Date of Birth: 1929/09/01

## 2021-07-16 ENCOUNTER — Ambulatory Visit (HOSPITAL_COMMUNITY): Payer: PPO | Attending: Internal Medicine | Admitting: Physical Therapy

## 2021-07-16 ENCOUNTER — Other Ambulatory Visit: Payer: Self-pay

## 2021-07-16 DIAGNOSIS — L97811 Non-pressure chronic ulcer of other part of right lower leg limited to breakdown of skin: Secondary | ICD-10-CM | POA: Diagnosis not present

## 2021-07-16 DIAGNOSIS — R2689 Other abnormalities of gait and mobility: Secondary | ICD-10-CM | POA: Insufficient documentation

## 2021-07-16 NOTE — Addendum Note (Signed)
Addended by: Leeroy Cha on: 07/16/2021 08:26 AM   Modules accepted: Orders

## 2021-07-16 NOTE — Therapy (Addendum)
[place], [place], [address] Phone: [phone]   Fax:  [phone]  Wound Care Therapy  Patient Details  Name: [name] MRN: [id number] Date of Birth: 1929-08-11 Referring Provider (PT): [name]   Encounter Date: 07/16/2021   PT End of Session - 07/16/21 1714     Visit Number 43    Number of Visits 50    Date for PT Re-Evaluation 08/22/21    Authorization Type [employer] (no auth req, no visit limit)    Progress Note Due on Visit 83    PT Start Time 1450    PT Stop Time 1545    PT Time Calculation (min) 55 min    Activity Tolerance Patient tolerated treatment well    Behavior During Therapy [facility] for tasks assessed/performed             Past Medical History:  Diagnosis Date   Allergic rhinitis    Aortic stenosis    Asthmatic bronchitis    Celiac disease    Cellulitis    CKD (chronic kidney disease)    Diverticulosis    Fx. left wrist 1987   Gastric polyps    GERD (gastroesophageal reflux disease)    History of pneumonia    Hypertension    Hypothyroidism    Iron deficiency anemia    Melanoma ([place])    Neuropathy    PAF (paroxysmal atrial fibrillation) ([place])    Prostate cancer ([place])    REM sleep behavior disorder    RLS (restless legs syndrome)    Sleep apnea    Spasticity    Stroke ([place]) 09/2017   Tremor    Type 2 diabetes mellitus ([place])     Past Surgical History:  Procedure Laterality Date   APPENDECTOMY  1978   CARPAL TUNNEL RELEASE Left    CATARACT EXTRACTION  03/2011   bilateral    CHOLECYSTECTOMY  2001   COLONOSCOPY W/ BIOPSIES  04/27/2008   diverticulosis, duodenitis   FOOT SURGERY     HERNIA REPAIR  1994   bilateral   KNEE SURGERY Bilateral    x 2   LUNG SURGERY  2001   for infection   PROSTATECTOMY  2002   UPPER GASTROINTESTINAL ENDOSCOPY  04/27/2008   celiac disease, gastric polyps    There were no vitals filed for this  visit.               Wound Therapy - 07/16/21 1706     Subjective still without order approved for Lt LE.  STates it is starting to bother him and reports his Rt LE really hurts at night.    Patient and Family Stated Goals wound to heal    Date of Onset 09/06/20    Prior Treatments MD and self care    Evaluation and Treatment Procedures Explained to Patient/Family Yes    Evaluation and Treatment Procedures agreed to    Wound Properties Date First Assessed: 04/26/21 Time First Assessed: 7564 Wound Type: Non-pressure wound Location: Leg Location Orientation: Right Wound Description (Comments): superior to original wound Present on Admission: No   Dressing Type Gauze (Comment)    Dressing Changed Changed    Dressing Status Old drainage    Dressing Change Frequency PRN    Site / Wound Assessment Yellow    % Wound base Red or Granulating 70%    % Wound base Yellow/Fibrinous Exudate 30%    Peri-wound Assessment Erythema (blanchable)  Drainage Amount Minimal    Drainage Description Serous    Treatment Cleansed;Debridement (Selective)    Wound Properties Date First Assessed: 12/04/20 Time First Assessed: 9485 Wound Type: Venous stasis ulcer Location: Leg Location Orientation: Right Present on Admission: Yes   Dressing Type Hydrogel;Impregnated gauze (bismuth)    Dressing Changed Changed    Dressing Change Frequency PRN    Site / Wound Assessment Granulation tissue    % Wound base Red or Granulating 100%    % Wound base Yellow/Fibrinous Exudate 0%    Peri-wound Assessment Edema    Drainage Amount Minimal    Drainage Description Serous    Treatment Cleansed;Debridement (Selective)    Selective Debridement - Location wound beds Rt LE only    Selective Debridement - Tools Used Scalpel;Forceps    Selective Debridement - Tissue Removed slough    Wound Therapy - Clinical Statement Dressing remains intact to bil LE's.  Wife concerned with Lt LE and eager to begin treatment.   suggested she stop by MD office to get a signed order.  REmoved dressing from [place] as she has not changed it and noted increased edema above and below wrap.  Clenased LE well applied vaseline and placed profore lite on Lt as well as Rt to help reduce edema.  No debridement to wound, hwoever cleansed and replace with xeroform.  Most superior wound Rt LE with more slough today and painful to debride.  Resumed santyl to superior wound and continued with xeroform to most distal one.    Wound Therapy - Functional Problem List gait and balance    Factors Delaying/Impairing Wound Healing Diabetes Mellitus;Vascular compromise;Altered sensation    Wound Therapy - Frequency 2X / week    Wound Plan F/U with referral for Lt LE.  Continue appropriate wound care and dressings.  Trial of antifungal cream to Rt LE to see if clears up raw patches; instructed wife to bring.    Dressing  inferior wound xeroform hydrogel, 2x2 and profore lite    Dressing superior wound: santyl, 2x2 and profore lite.    Dressing Lt LE:  no debridement, dressing change with xeroform and profore lite                       PT Short Term Goals - 07/12/21 1627       PT SHORT TERM GOAL #1   Title Patient will have at least 50% granualtion tissue.    Status Partially Met               PT Long Term Goals - 07/12/21 1627       PT LONG TERM GOAL #1   Title Patient's wound will be healed to reduce the risk of infection.    Status On-going      PT LONG TERM GOAL #2   Title Patient will be able to verbalize completion of daily skin checks to reduce the risk of further wound development.    Status On-going                    Patient will benefit from skilled therapeutic intervention in order to improve the following deficits and impairments:     Visit Diagnosis: Ulcer of right pretibial region, limited to breakdown of skin (HCC)  Other abnormalities of gait and mobility     Problem List Patient  Active Problem List   Diagnosis Date Noted   Advanced nonexudative age-related macular degeneration of right  eye without subfoveal involvement 11/15/2020   Advanced nonexudative age-related macular degeneration of left eye without subfoveal involvement 11/15/2020   Diabetes mellitus without complication ([place]) 66/03/3015   Posterior vitreous detachment of both eyes 11/15/2020   Gait disturbance, post-stroke 11/02/2017   Aphasia as late effect of cerebrovascular accident 11/02/2017   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   Aphasia    PAF (paroxysmal atrial fibrillation) ([place])    Diabetes mellitus type 2 in obese ([place])    Benign essential HTN    Hypothyroidism    Hyperlipidemia    Stroke (cerebrum) ([place]) 10/02/2017   Afib ([place]) 01/10/2017   SOB (shortness of breath) 10/21/3233   Acute diastolic CHF (congestive heart failure) ([place]) 01/10/2017   Atrial fibrillation with RVR ([place]) 01/10/2017   Claudication ([place]) 07/25/2016   Paroxysmal atrial fibrillation ([place]) 07/25/2016   Odynophagia 04/10/2014   Allergic rhinitis 03/23/2014   Chronic rhinitis 12/22/2013   Intrinsic asthma 07/15/2013   Constipation 08/22/2011   Obesity 09/20/2009   G E R D 11/29/2007   Celiac disease 11/29/2007    Teena Irani 07/16/2021, 5:19 PM  [place], [place], [address] Phone: [phone]   Fax:  [phone]  Name: [name] MRN: [id number] Date of Birth: [birth year]

## 2021-07-17 DIAGNOSIS — E119 Type 2 diabetes mellitus without complications: Secondary | ICD-10-CM | POA: Diagnosis not present

## 2021-07-19 ENCOUNTER — Encounter (HOSPITAL_COMMUNITY): Payer: Self-pay | Admitting: Physical Therapy

## 2021-07-19 ENCOUNTER — Ambulatory Visit (HOSPITAL_COMMUNITY): Payer: PPO | Admitting: Physical Therapy

## 2021-07-19 ENCOUNTER — Other Ambulatory Visit: Payer: Self-pay

## 2021-07-19 DIAGNOSIS — R2689 Other abnormalities of gait and mobility: Secondary | ICD-10-CM

## 2021-07-19 DIAGNOSIS — L97811 Non-pressure chronic ulcer of other part of right lower leg limited to breakdown of skin: Secondary | ICD-10-CM

## 2021-07-19 NOTE — Therapy (Signed)
[place], [place], [address] Phone: [phone]   Fax:  [phone]  Wound Care Therapy  Patient Details  Name: [name] MRN: [id number] Date of Birth: 01-Oct-1929 Referring Provider (PT): [name]   Encounter Date: 07/19/2021   PT End of Session - 07/19/21 1601     Visit Number 60    Number of Visits 26    Date for PT Re-Evaluation 08/22/21    Authorization Type [employer] (no auth req, no visit limit)    Progress Note Due on Visit 83    PT Start Time 1450    PT Stop Time 1552    PT Time Calculation (min) 62 min    Activity Tolerance Patient tolerated treatment well    Behavior During Therapy WFL for tasks assessed/performed             Past Medical History:  Diagnosis Date   Allergic rhinitis    Aortic stenosis    Asthmatic bronchitis    Celiac disease    Cellulitis    CKD (chronic kidney disease)    Diverticulosis    Fx. left wrist 1987   Gastric polyps    GERD (gastroesophageal reflux disease)    History of pneumonia    Hypertension    Hypothyroidism    Iron deficiency anemia    Melanoma ([place])    Neuropathy    PAF (paroxysmal atrial fibrillation) ([place])    Prostate cancer ([place])    REM sleep behavior disorder    RLS (restless legs syndrome)    Sleep apnea    Spasticity    Stroke ([place]) 09/2017   Tremor    Type 2 diabetes mellitus ([place])     Past Surgical History:  Procedure Laterality Date   APPENDECTOMY  1978   CARPAL TUNNEL RELEASE Left    CATARACT EXTRACTION  03/2011   bilateral    CHOLECYSTECTOMY  2001   COLONOSCOPY W/ BIOPSIES  04/27/2008   diverticulosis, duodenitis   FOOT SURGERY     HERNIA REPAIR  1994   bilateral   KNEE SURGERY Bilateral    x 2   LUNG SURGERY  2001   for infection   PROSTATECTOMY  2002   UPPER GASTROINTESTINAL ENDOSCOPY  04/27/2008   celiac disease, gastric polyps    There were no vitals filed for this visit.      [facility] PT  Assessment - 07/19/21 0001       Assessment   Medical Diagnosis Chronic venous stasis wound    Referring Provider (PT) [name]                     Wound Therapy - 07/19/21 0001     Subjective Legs very painful at night. on the back of righ tleg and side of right leg. STates they went to MD office and they stated they faxed it over.    Patient and Family Stated Goals wound to heal    Date of Onset 09/06/20    Prior Treatments MD and self care    Pain Scale Faces    Faces Pain Scale Hurts whole lot    Pain Intervention(s) Emotional support    Evaluation and Treatment Procedures Explained to Patient/Family Yes    Evaluation and Treatment Procedures agreed to    Wound Properties Date First Assessed: 07/19/21 Time First Assessed: 1505 Wound Type: Venous stasis ulcer Location: Pretibial Location Orientation: Left Wound Description (Comments): left  leg wound Present on Admission: No   Dressing Type Impregnated gauze (bismuth);Hydrogel   profore,   Dressing Changed Changed    Dressing Status Old drainage    Dressing Change Frequency PRN    Site / Wound Assessment Painful;Pale;Dusky;Pink    % Wound base Other/Granulation Tissue (Comment) 100%    Peri-wound Assessment Edema;Erythema (blanchable)    Wound Length (cm) 4 cm    Wound Width (cm) 5 cm    Wound Depth (cm) 0 cm    Wound Volume (cm^3) 0 cm^3    Wound Surface Area (cm^2) 20 cm^2    Margins Attached edges (approximated)    Drainage Amount Scant    Drainage Description Serous;No odor    Treatment Cleansed    Wound Properties Date First Assessed: 04/26/21 Time First Assessed: 1549 Wound Type: Non-pressure wound Location: Leg Location Orientation: Right Wound Description (Comments): superior to original wound Present on Admission: No   Dressing Type Gauze (Comment)   santyl   Dressing Changed Changed    Dressing Status Old drainage    Dressing Change Frequency PRN    Site / Wound Assessment Bleeding;Yellow    % Wound  base Red or Granulating 75%    % Wound base Yellow/Fibrinous Exudate 25%    Peri-wound Assessment Edema;Erythema (blanchable)   dry flaky skin   Wound Length (cm) 2.4 cm   was 2.6   Wound Width (cm) 1.3 cm   was 1.4   Wound Surface Area (cm^2) 3.12 cm^2    Drainage Amount Minimal    Drainage Description Serosanguineous    Treatment Debridement (Selective);Cleansed    Wound Properties Date First Assessed: 12/04/20 Time First Assessed: 9794 Wound Type: Venous stasis ulcer Location: Leg Location Orientation: Right Present on Admission: Yes   Dressing Type Hydrogel;Impregnated gauze (bismuth)    Dressing Changed Changed    Dressing Change Frequency PRN    Site / Wound Assessment Granulation tissue    % Wound base Red or Granulating 100%    Peri-wound Assessment Edema   dry skin   Wound Length (cm) 0.5 cm   was 1.2   Wound Width (cm) 0.4 cm   was 1.5   Wound Depth (cm) --   was .2   Wound Surface Area (cm^2) 0.2 cm^2    Drainage Amount Minimal    Drainage Description Serous    Treatment Cleansed;Debridement (Selective)    Selective Debridement - Location wound beds Rt LE only    Selective Debridement - Tools Used Scalpel;Forceps    Selective Debridement - Tissue Removed slough    Wound Therapy - Clinical Statement Inferior wound on right continues to reduce in size. Superior wound with increase slough on this date. Pain with debridement and pain noted along posterior right leg where small discoloring (purple) is noted) Educated patient to make sure he is not placing pressure on the back of his legs and to remove his socks when he sits/lays down s spot is where socks band would be. Continued with Santyl on superior wound and xeroform on inferior wound. Took time to thoroughly clean right leg and remove as much dry skin as able. Applied fungal cream on right leg and will assess next session to see if rash is fungal in nature. Washed left leg and wound appears dusky. Continued with xeroform and  hydrogel to maintain moisture and profore lite. Wound order for left leg not obtained yet, will continue to contact MD, patient reported they would go there again today to get  paper copy of order. Will continue with current POC as tolerated.    Wound Therapy - Functional Problem List gait and balance    Factors Delaying/Impairing Wound Healing Diabetes Mellitus;Vascular compromise;Altered sensation    Wound Therapy - Frequency 2X / week    Wound Plan F/U with referral for Lt LE.  Continue appropriate wound care and dressings.  Trial of antifungal cream to Rt LE to see if clears up raw patches; instructed wife to bring.    Dressing  inferior wound xeroform hydrogel, 2x2 and profore lite, fungal cream    Dressing superior wound: santyl, 2x2 and profore lite. funcgal cream    Dressing left leg - no debridement, washed and dressed with xeroform, hydrogel and profore lite                       PT Short Term Goals - 07/12/21 1627       PT SHORT TERM GOAL #1   Title Patient will have at least 50% granualtion tissue.    Status Partially Met               PT Long Term Goals - 07/12/21 1627       PT LONG TERM GOAL #1   Title Patient's wound will be healed to reduce the risk of infection.    Status On-going      PT LONG TERM GOAL #2   Title Patient will be able to verbalize completion of daily skin checks to reduce the risk of further wound development.    Status On-going                   Plan - 07/19/21 1602     Clinical Impression Statement see above    Personal Factors and Comorbidities Age;Comorbidity 3+;Past/Current Experience;Fitness;Time since onset of injury/illness/exacerbation    Comorbidities DM, CKD, neuorpathy, Hx CVA    Examination-Activity Limitations Locomotion Level;Transfers;Squat;Stand;Dressing    Examination-Participation Restrictions Community Activity    Stability/Clinical Decision Making Stable/Uncomplicated    Rehab Potential Good     PT Frequency 2x / week    PT Treatment/Interventions Gait training;Stair training;Functional mobility training;DME Instruction;Therapeutic activities;Therapeutic exercise;Balance training;Neuromuscular re-education;Patient/family education;Compression bandaging;Manual lymph drainage;Manual techniques    PT Next Visit Plan Continue with both chemical and mechanical debridement to remove adherent eschar to allow healing to occur             Patient will benefit from skilled therapeutic intervention in order to improve the following deficits and impairments:  Abnormal gait, Decreased skin integrity, Decreased mobility, Impaired sensation  Visit Diagnosis: Ulcer of right pretibial region, limited to breakdown of skin (HCC)  Other abnormalities of gait and mobility     Problem List Patient Active Problem List   Diagnosis Date Noted   Advanced nonexudative age-related macular degeneration of right eye without subfoveal involvement 11/15/2020   Advanced nonexudative age-related macular degeneration of left eye without subfoveal involvement 11/15/2020   Diabetes mellitus without complication ([place]) 28/31/5176   Posterior vitreous detachment of both eyes 11/15/2020   Gait disturbance, post-stroke 11/02/2017   Aphasia as late effect of cerebrovascular accident 11/02/2017   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   Aphasia    PAF (paroxysmal atrial fibrillation) ([place])    Diabetes mellitus type 2 in obese ([place])    Benign essential HTN    Hypothyroidism    Hyperlipidemia    Stroke (cerebrum) ([place]) 10/02/2017   Afib ([place]) 01/10/2017   SOB (shortness  of breath) 57/49/3552   Acute diastolic CHF (congestive heart failure) ([place]) 01/10/2017   Atrial fibrillation with RVR ([place]) 01/10/2017   Claudication ([place]) 07/25/2016   Paroxysmal atrial fibrillation ([place]) 07/25/2016   Odynophagia 04/10/2014   Allergic rhinitis 03/23/2014   Chronic rhinitis 12/22/2013   Intrinsic asthma  07/15/2013   Constipation 08/22/2011   Obesity 09/20/2009   G E R D 11/29/2007   Celiac disease 11/29/2007   4:12 PM, 07/19/21 Jerene Pitch, DPT Physical Therapy with [facility]  [phone] office'   [place], [place], [address] Phone: [phone]   Fax:  [phone]  Name: [name] MRN: [id number] Date of Birth: September 29, 1929

## 2021-07-23 ENCOUNTER — Ambulatory Visit (HOSPITAL_COMMUNITY): Payer: PPO

## 2021-07-23 ENCOUNTER — Encounter (HOSPITAL_COMMUNITY): Payer: Self-pay

## 2021-07-23 ENCOUNTER — Other Ambulatory Visit: Payer: Self-pay

## 2021-07-23 DIAGNOSIS — R2689 Other abnormalities of gait and mobility: Secondary | ICD-10-CM

## 2021-07-23 DIAGNOSIS — L97811 Non-pressure chronic ulcer of other part of right lower leg limited to breakdown of skin: Secondary | ICD-10-CM

## 2021-07-23 NOTE — Therapy (Addendum)
[place], [place], [address] Phone: [phone]   Fax:  [phone]  Wound Care Therapy  Patient Details  Name: [name] MRN: [id number] Date of Birth: 14-Feb-1929 Referring Provider (PT): [name]   Encounter Date: 07/23/2021   PT End of Session - 07/23/21 1609     Visit Number 70    Number of Visits 40    Date for PT Re-Evaluation 08/22/21    Authorization Type [employer] (no auth req, no visit limit)    Progress Note Due on Visit 83    PT Start Time 1450    PT Stop Time 1538    PT Time Calculation (min) 48 min    Activity Tolerance Patient tolerated treatment well    Behavior During Therapy [facility] for tasks assessed/performed             Past Medical History:  Diagnosis Date   Allergic rhinitis    Aortic stenosis    Asthmatic bronchitis    Celiac disease    Cellulitis    CKD (chronic kidney disease)    Diverticulosis    Fx. left wrist 1987   Gastric polyps    GERD (gastroesophageal reflux disease)    History of pneumonia    Hypertension    Hypothyroidism    Iron deficiency anemia    Melanoma ([place])    Neuropathy    PAF (paroxysmal atrial fibrillation) ([place])    Prostate cancer ([place])    REM sleep behavior disorder    RLS (restless legs syndrome)    Sleep apnea    Spasticity    Stroke ([place]) 09/2017   Tremor    Type 2 diabetes mellitus ([place])     Past Surgical History:  Procedure Laterality Date   APPENDECTOMY  1978   CARPAL TUNNEL RELEASE Left    CATARACT EXTRACTION  03/2011   bilateral    CHOLECYSTECTOMY  2001   COLONOSCOPY W/ BIOPSIES  04/27/2008   diverticulosis, duodenitis   FOOT SURGERY     HERNIA REPAIR  1994   bilateral   KNEE SURGERY Bilateral    x 2   LUNG SURGERY  2001   for infection   PROSTATECTOMY  2002   UPPER GASTROINTESTINAL ENDOSCOPY  04/27/2008   celiac disease, gastric polyps    There were no vitals filed for this visit.    Subjective  Assessment - 07/23/21 1554     Subjective Wife went to MD off and picked up what she thought was signed referral, arrived to clinic with signed POC not referral.  Pt arrived with dressings intact, no reoprts of pain.    Currently in Pain? No/denies                       Wound Therapy - 07/23/21 0001     Subjective Wife went to MD off and picked up what she thought was signed referral, arrived to clinic with signed POC not referral.  Pt arrived with dressings intact, no reoprts of pain.    Patient and Family Stated Goals wound to heal    Date of Onset 09/06/20    Prior Treatments MD and self care    Pain Scale 0-10    Pain Score --   Does c/o pain during superior wound care   Evaluation and Treatment Procedures Explained to Patient/Family Yes    Evaluation and Treatment Procedures agreed to    Wound Properties Date  First Assessed: 07/19/21 Time First Assessed: 9030 Wound Type: Venous stasis ulcer Location: Pretibial Location Orientation: Left Wound Description (Comments): left leg wound Present on Admission: No   Wound Image Images linked: 1    Dressing Type Impregnated gauze (bismuth);Compression wrap   xeroform, profore lite   Dressing Changed Changed    Dressing Status Old drainage    Dressing Change Frequency PRN    Site / Wound Assessment Painful;Pale;Dry    Drainage Amount Scant    Drainage Description Serous;No odor    Treatment Cleansed    Wound Properties Date First Assessed: 04/26/21 Time First Assessed: 1549 Wound Type: Non-pressure wound Location: Leg Location Orientation: Right Wound Description (Comments): superior to original wound Present on Admission: No   Wound Image Images linked: 1    Dressing Type Gauze (Comment)   Santyl, 4x4, profore lite   Dressing Changed Changed    Dressing Status Old drainage    Dressing Change Frequency PRN    Site / Wound Assessment Yellow;Pink;Bleeding    % Wound base Red or Granulating 75%    % Wound base Yellow/Fibrinous  Exudate 25%    Peri-wound Assessment Edema;Erythema (blanchable)    Drainage Amount Minimal    Drainage Description Serosanguineous    Treatment Cleansed;Debridement (Selective)    Wound Properties Date First Assessed: 12/04/20 Time First Assessed: 0923 Wound Type: Venous stasis ulcer Location: Leg Location Orientation: Right Present on Admission: Yes   Wound Image Images linked: 1    Dressing Type Hydrogel;Impregnated gauze (bismuth);Compression wrap   hydrogel, xeroform, profore lite   Dressing Changed Changed    Dressing Change Frequency PRN    Site / Wound Assessment Granulation tissue    % Wound base Red or Granulating 100%    % Wound base Yellow/Fibrinous Exudate 0%    Peri-wound Assessment Edema   dry skin   Drainage Amount Minimal    Drainage Description Serous    Treatment Cleansed;Debridement (Selective)    Selective Debridement - Location wound beds Rt LE only    Selective Debridement - Tools Used Scalpel;Forceps    Selective Debridement - Tissue Removed slough    Wound Therapy - Clinical Statement Wife arrived with signed POC, called MD office and left message, refaxed referral as well as gave wife copy as well.  Superior wound continues to have adherent slough, continues to have some pain during debridement.  Resumed santyl to address adherent slough.  Inferior wound continues to improve with decrease in size and 100% granulation following debridment.  Continued wiht profore lite for edema control.  Overall skin on Rt LE is red, decreased overall redness wiht additional fungal cream and less dry skin to remove this session.    Wound Therapy - Functional Problem List gait and balance    Factors Delaying/Impairing Wound Healing Diabetes Mellitus;Vascular compromise;Altered sensation    Wound Therapy - Frequency 2X / week    Wound Plan F/U with referral for Lt LE.  Continue appropriate wound care and dressings.  Trial of antifungal cream to Rt LE to see if clears up raw patches;  instructed wife to bring.    Dressing  inferior wound xeroform hydrogel, 2x2 and profore lite, fungal cream    Dressing superior wound: santyl, 2x2 and profore lite. funcgal cream    Dressing Lt LE,  No debridement.  Cleansed, xeroform place and profore lite.                       PT Short  Term Goals - 07/12/21 1627       PT SHORT TERM GOAL #1   Title Patient will have at least 50% granualtion tissue.    Status Partially Met               PT Long Term Goals - 07/12/21 1627       PT LONG TERM GOAL #1   Title Patient's wound will be healed to reduce the risk of infection.    Status On-going      PT LONG TERM GOAL #2   Title Patient will be able to verbalize completion of daily skin checks to reduce the risk of further wound development.    Status On-going                    Patient will benefit from skilled therapeutic intervention in order to improve the following deficits and impairments:     Visit Diagnosis: Ulcer of right pretibial region, limited to breakdown of skin ([place])  Other abnormalities of gait and mobility     Problem List Patient Active Problem List   Diagnosis Date Noted   Advanced nonexudative age-related macular degeneration of right eye without subfoveal involvement 11/15/2020   Advanced nonexudative age-related macular degeneration of left eye without subfoveal involvement 11/15/2020   Diabetes mellitus without complication ([place]) 02/77/4128   Posterior vitreous detachment of both eyes 11/15/2020   Gait disturbance, post-stroke 11/02/2017   Aphasia as late effect of cerebrovascular accident 11/02/2017   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   Aphasia    PAF (paroxysmal atrial fibrillation) ([place])    Diabetes mellitus type 2 in obese ([place])    Benign essential HTN    Hypothyroidism    Hyperlipidemia    Stroke (cerebrum) ([place]) 10/02/2017   Afib ([place]) 01/10/2017   SOB (shortness of breath) 78/67/6720    Acute diastolic CHF (congestive heart failure) ([place]) 01/10/2017   Atrial fibrillation with RVR ([place]) 01/10/2017   Claudication ([place]) 07/25/2016   Paroxysmal atrial fibrillation ([place]) 07/25/2016   Odynophagia 04/10/2014   Allergic rhinitis 03/23/2014   Chronic rhinitis 12/22/2013   Intrinsic asthma 07/15/2013   Constipation 08/22/2011   Obesity 09/20/2009   G E R D 11/29/2007   Celiac disease 11/29/2007   Ihor Austin, LPTA/CLT; CBIS [phone]  Aldona Lento 07/23/2021, 6:45 PM  [place] [address] [place], [place], [address] Phone: [phone]   Fax:  [phone]  Name: [name] MRN: [id number] Date of Birth: 1929/04/29

## 2021-07-25 ENCOUNTER — Encounter (HOSPITAL_COMMUNITY): Payer: Self-pay | Admitting: Physical Therapy

## 2021-07-25 ENCOUNTER — Other Ambulatory Visit: Payer: Self-pay

## 2021-07-25 ENCOUNTER — Ambulatory Visit (HOSPITAL_COMMUNITY): Payer: PPO | Admitting: Physical Therapy

## 2021-07-25 DIAGNOSIS — L97811 Non-pressure chronic ulcer of other part of right lower leg limited to breakdown of skin: Secondary | ICD-10-CM

## 2021-07-25 DIAGNOSIS — R2689 Other abnormalities of gait and mobility: Secondary | ICD-10-CM

## 2021-07-25 NOTE — Therapy (Signed)
[place], [place], [address] Phone: [phone]   Fax:  [phone]  Wound Care Therapy  Patient Details  Name: [name] MRN: [id number] Date of Birth: November 22, 1928 Referring Provider (PT): [name]   Encounter Date: 07/25/2021   PT End of Session - 07/25/21 1450     Visit Number 66    Number of Visits 98    Date for PT Re-Evaluation 08/22/21    Authorization Type [employer] (no auth req, no visit limit)    Progress Note Due on Visit 83    PT Start Time 1450    PT Stop Time 1538    PT Time Calculation (min) 48 min    Activity Tolerance Patient tolerated treatment well    Behavior During Therapy [facility] for tasks assessed/performed             Past Medical History:  Diagnosis Date   Allergic rhinitis    Aortic stenosis    Asthmatic bronchitis    Celiac disease    Cellulitis    CKD (chronic kidney disease)    Diverticulosis    Fx. left wrist 1987   Gastric polyps    GERD (gastroesophageal reflux disease)    History of pneumonia    Hypertension    Hypothyroidism    Iron deficiency anemia    Melanoma ([place])    Neuropathy    PAF (paroxysmal atrial fibrillation) ([place])    Prostate cancer ([place])    REM sleep behavior disorder    RLS (restless legs syndrome)    Sleep apnea    Spasticity    Stroke ([place]) 09/2017   Tremor    Type 2 diabetes mellitus ([place])     Past Surgical History:  Procedure Laterality Date   APPENDECTOMY  1978   CARPAL TUNNEL RELEASE Left    CATARACT EXTRACTION  03/2011   bilateral    CHOLECYSTECTOMY  2001   COLONOSCOPY W/ BIOPSIES  04/27/2008   diverticulosis, duodenitis   FOOT SURGERY     HERNIA REPAIR  1994   bilateral   KNEE SURGERY Bilateral    x 2   LUNG SURGERY  2001   for infection   PROSTATECTOMY  2002   UPPER GASTROINTESTINAL ENDOSCOPY  04/27/2008   celiac disease, gastric polyps    There were no vitals filed for this  visit.               Wound Therapy - 07/25/21 0001     Subjective Patient's wife got referral from MD for LLE.    Patient and Family Stated Goals wound to heal    Date of Onset 09/06/20    Prior Treatments MD and self care    Pain Score 0-No pain    Evaluation and Treatment Procedures Explained to Patient/Family Yes    Evaluation and Treatment Procedures agreed to    Wound Properties Date First Assessed: 07/19/21 Time First Assessed: 1505 Wound Type: Venous stasis ulcer Location: Pretibial Location Orientation: Left Wound Description (Comments): left leg wound Present on Admission: No   Dressing Type Impregnated gauze (bismuth);Compression wrap   xeroform   Dressing Changed Changed    Dressing Status Old drainage    Dressing Change Frequency PRN    Site / Wound Assessment Red;Granulation tissue    Drainage Amount Scant    Drainage Description Serous;No odor    Treatment Cleansed    Wound Properties Date First Assessed: 04/26/21 Time First Assessed: 0272  Wound Type: Non-pressure wound Location: Leg Location Orientation: Right Wound Description (Comments): superior to original wound Present on Admission: No   Dressing Type Gauze (Comment);Compression wrap    Dressing Changed Changed    Dressing Status Old drainage    Dressing Change Frequency PRN    Site / Wound Assessment Yellow;Red;Pink    % Wound base Red or Granulating 75%    % Wound base Yellow/Fibrinous Exudate 25%    Peri-wound Assessment Edema;Erythema (blanchable)    Wound Length (cm) 1.7 cm    Wound Width (cm) 1.2 cm    Wound Surface Area (cm^2) 2.04 cm^2    Drainage Amount Minimal    Drainage Description Serosanguineous    Treatment Cleansed;Debridement (Selective)    Wound Properties Date First Assessed: 12/04/20 Time First Assessed: 2130 Wound Type: Venous stasis ulcer Location: Leg Location Orientation: Right Present on Admission: Yes   Dressing Type Hydrogel;Impregnated gauze (bismuth);Compression wrap     Dressing Changed Changed    Dressing Change Frequency PRN    Site / Wound Assessment Granulation tissue    % Wound base Red or Granulating 100%    Wound Length (cm) 0.4 cm    Wound Width (cm) 0.2 cm    Wound Surface Area (cm^2) 0.08 cm^2    Drainage Amount Minimal    Drainage Description Serous    Treatment Cleansed;Debridement (Selective)    Selective Debridement - Location wound beds Rt LE only    Selective Debridement - Tools Used Scalpel;Forceps    Selective Debridement - Tissue Removed slough    Wound Therapy - Clinical Statement Wife arrived with signed order for LLE. Cleansed bilateral LE. Debrided R LE wounds and both wounds decreasing in size. Continued with same dressings as wounds appear to be healing well. Redness decreasing in RLE. Patient will continue to benefit from skilled PT to promote wound healing.    Wound Therapy - Functional Problem List gait and balance    Factors Delaying/Impairing Wound Healing Diabetes Mellitus;Vascular compromise;Altered sensation    Wound Therapy - Frequency 2X / week    Wound Plan F/U with referral for Lt LE.  Continue appropriate wound care and dressings.  Trial of antifungal cream to Rt LE to see if clears up raw patches; instructed wife to bring.    Dressing  inferior wound xeroform hydrogel, 2x2 and profore lite, fungal cream    Dressing superior wound: santyl, 2x2 and profore lite. fungal cream    Dressing Lt: vaseline, xeroform, profore lite                       PT Short Term Goals - 07/12/21 1627       PT SHORT TERM GOAL #1   Title Patient will have at least 50% granualtion tissue.    Status Partially Met               PT Long Term Goals - 07/12/21 1627       PT LONG TERM GOAL #1   Title Patient's wound will be healed to reduce the risk of infection.    Status On-going      PT LONG TERM GOAL #2   Title Patient will be able to verbalize completion of daily skin checks to reduce the risk of further wound  development.    Status On-going                   Plan - 07/25/21 1450     Clinical  Impression Statement see above    Personal Factors and Comorbidities Age;Comorbidity 3+;Past/Current Experience;Fitness;Time since onset of injury/illness/exacerbation    Comorbidities DM, CKD, neuorpathy, Hx CVA    Examination-Activity Limitations Locomotion Level;Transfers;Squat;Stand;Dressing    Examination-Participation Restrictions Community Activity    Stability/Clinical Decision Making Stable/Uncomplicated    Rehab Potential Good    PT Frequency 2x / week    PT Treatment/Interventions Gait training;Stair training;Functional mobility training;DME Instruction;Therapeutic activities;Therapeutic exercise;Balance training;Neuromuscular re-education;Patient/family education;Compression bandaging;Manual lymph drainage;Manual techniques    PT Next Visit Plan Continue with both chemical and mechanical debridement to remove adherent eschar to allow healing to occur             Patient will benefit from skilled therapeutic intervention in order to improve the following deficits and impairments:  Abnormal gait, Decreased skin integrity, Decreased mobility, Impaired sensation  Visit Diagnosis: Ulcer of right pretibial region, limited to breakdown of skin (HCC)  Other abnormalities of gait and mobility     Problem List Patient Active Problem List   Diagnosis Date Noted   Advanced nonexudative age-related macular degeneration of right eye without subfoveal involvement 11/15/2020   Advanced nonexudative age-related macular degeneration of left eye without subfoveal involvement 11/15/2020   Diabetes mellitus without complication ([place]) 25/36/6440   Posterior vitreous detachment of both eyes 11/15/2020   Gait disturbance, post-stroke 11/02/2017   Aphasia as late effect of cerebrovascular accident 11/02/2017   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   Aphasia    PAF (paroxysmal  atrial fibrillation) ([place])    Diabetes mellitus type 2 in obese ([place])    Benign essential HTN    Hypothyroidism    Hyperlipidemia    Stroke (cerebrum) ([place]) 10/02/2017   Afib ([place]) 01/10/2017   SOB (shortness of breath) 34/74/2595   Acute diastolic CHF (congestive heart failure) ([place]) 01/10/2017   Atrial fibrillation with RVR ([place]) 01/10/2017   Claudication ([place]) 07/25/2016   Paroxysmal atrial fibrillation ([place]) 07/25/2016   Odynophagia 04/10/2014   Allergic rhinitis 03/23/2014   Chronic rhinitis 12/22/2013   Intrinsic asthma 07/15/2013   Constipation 08/22/2011   Obesity 09/20/2009   G E R D 11/29/2007   Celiac disease 11/29/2007    3:54 PM, 07/25/21 Mearl Latin PT, DPT Physical Therapist at [facility] [address] [place], [place], [address] Phone: [phone]   Fax:  [phone]  Name: JANAI BRANNIGAN MRN: [id number] Date of Birth: 1929/02/15

## 2021-07-30 ENCOUNTER — Encounter (HOSPITAL_COMMUNITY): Payer: Self-pay

## 2021-07-30 ENCOUNTER — Other Ambulatory Visit: Payer: Self-pay

## 2021-07-30 ENCOUNTER — Ambulatory Visit (HOSPITAL_COMMUNITY): Payer: PPO

## 2021-07-30 DIAGNOSIS — L97811 Non-pressure chronic ulcer of other part of right lower leg limited to breakdown of skin: Secondary | ICD-10-CM | POA: Diagnosis not present

## 2021-07-30 DIAGNOSIS — R2689 Other abnormalities of gait and mobility: Secondary | ICD-10-CM

## 2021-07-30 NOTE — Therapy (Signed)
[facility] [address] [place], [place], [address] Phone: [phone]   Fax:  [phone]  Physical Therapy Treatment  Patient Details  Name: [name] MRN: [id number] Date of Birth: [birth year] Referring Provider (PT): [name]   Encounter Date: 07/30/2021   PT End of Session - 07/30/21 1719     Visit Number 19    Number of Visits 66    Date for PT Re-Evaluation 08/22/21    Authorization Type [employer] (Morgan auth req, Morgan visit limit)    Progress Note Due on Visit 83    PT Start Time 1406    PT Stop Time 1452    PT Time Calculation (min) 46 min    Activity Tolerance Patient tolerated treatment well    Behavior During Therapy [facility] for tasks assessed/performed             Past Medical History:  Diagnosis Date   Allergic rhinitis    Aortic stenosis    Asthmatic bronchitis    Celiac disease    Cellulitis    CKD (chronic kidney disease)    Diverticulosis    Fx. left wrist 1987   Gastric polyps    GERD (gastroesophageal reflux disease)    History of pneumonia    Hypertension    Hypothyroidism    Iron deficiency anemia    Melanoma ([place])    Neuropathy    PAF (paroxysmal atrial fibrillation) (HCC)    Prostate cancer ([place])    REM sleep behavior disorder    RLS (restless legs syndrome)    Sleep apnea    Spasticity    Stroke ([place]) 09/2017   Tremor    Type 2 diabetes mellitus ([place])     Past Surgical History:  Procedure Laterality Date   APPENDECTOMY  1978   CARPAL TUNNEL RELEASE Left    CATARACT EXTRACTION  03/2011   bilateral    CHOLECYSTECTOMY  2001   COLONOSCOPY W/ BIOPSIES  04/27/2008   diverticulosis, duodenitis   FOOT SURGERY     HERNIA REPAIR  1994   bilateral   KNEE SURGERY Bilateral    x 2   LUNG SURGERY  2001   for infection   PROSTATECTOMY  2002   UPPER GASTROINTESTINAL ENDOSCOPY  04/27/2008   celiac disease, gastric polyps    There were Morgan vitals filed for this visit.    Subjective Assessment - 07/30/21 1653     Subjective Pt arrived with dressings intact, Morgan reoprts of pain.  Noted dressings has slid down Rt LE and increased swelling.  Reports he has gain 5-6# since last weighed at MD's office.                             Wound Therapy - 07/30/21 0001     Subjective Pt arrived with dressings intact, Morgan reoprts of pain.  Noted dressings has slid down Rt LE and increased swelling.  Reports he has gain 5-6# since last weighed at MD's office.  Wife has been working on sacral wound.    Patient and Family Stated Goals wound to heal    Date of Onset 09/06/20    Prior Treatments MD and self care    Pain Scale 0-10    Pain Score 0-Morgan pain    Evaluation and Treatment Procedures Explained to Patient/Family Yes    Evaluation and Treatment Procedures agreed to    Wound Properties Date First  Assessed: 07/19/21 Time First Assessed: 1505 Wound Type: Venous stasis ulcer Location: Pretibial Location Orientation: Left Wound Description (Comments): left leg wound Present on Admission: Morgan   Wound Image Images linked: 1    Dressing Type Impregnated gauze (bismuth);Compression wrap;Gauze (Comment)    Dressing Changed Changed    Dressing Status Old drainage    Dressing Change Frequency PRN    Site / Wound Assessment Red;Granulation tissue    Drainage Amount Scant    Drainage Description Serous    Treatment Cleansed    Wound Properties Date First Assessed: 04/26/21 Time First Assessed: 6203 Wound Type: Non-pressure wound Location: Leg Location Orientation: Right Wound Description (Comments): superior to original wound Present on Admission: Morgan   Wound Image Images linked: 1    Dressing Type Gauze (Comment);Santyl;Compression wrap   Santyl, 2x2, vaseline, profore lite   Dressing Changed Changed    Dressing Status Old drainage    Dressing Change Frequency PRN    Site / Wound Assessment Yellow;Red;Pink    % Wound base Red or Granulating 75%    % Wound base  Yellow/Fibrinous Exudate 25%    Peri-wound Assessment Edema;Erythema (blanchable)    Drainage Amount Minimal    Drainage Description Serosanguineous    Treatment Cleansed;Debridement (Selective)    Wound Properties Date First Assessed: 12/04/20 Time First Assessed: 5597 Wound Type: Venous stasis ulcer Location: Leg Location Orientation: Right Present on Admission: Yes   Wound Image Images linked: 1    Dressing Type Hydrogel;Impregnated gauze (bismuth);Compression wrap    Dressing Changed Changed    Dressing Status Old drainage    Dressing Change Frequency PRN    Site / Wound Assessment Granulation tissue    % Wound base Red or Granulating 100%   following debridement   % Wound base Yellow/Fibrinous Exudate 0%    Peri-wound Assessment Edema    Drainage Amount Minimal    Drainage Description Serous    Treatment Cleansed;Debridement (Selective)    Selective Debridement - Location wound beds Rt LE only    Selective Debridement - Tools Used Scalpel;Forceps    Selective Debridement - Tissue Removed slough    Wound Therapy - Clinical Statement Dressings have slid down Rt LE exposing new wound and wheeping noted superior wound (see media).  Lt LE appears with decreased wound size, Morgan debridement necessary just cleansed well and application of compression garments for edema control.  Pt encouraged to bring juxtafit with him next session, may be able to return if continues to look good.  Selective debridement for removal of slough in wound bed and dry skin perimeter to promote healing. Redness present Rt LE.  Continued wiht profore lite for edema control.  Additional cotton added for cone shape to assist dressings from sliding down.  Morgan debridement complete on new superior wound, just cleansed and application of xeroform for healing    Wound Therapy - Functional Problem List gait and balance    Factors Delaying/Impairing Wound Healing Diabetes Mellitus;Vascular compromise;Altered sensation    Wound  Therapy - Frequency 2X / week    Wound Plan F/U with referral for Lt LE.  Continue appropriate wound care and dressings.  Trial of antifungal cream to Rt LE to see if clears up raw patches; instructed wife to bring.    Dressing  inferior wound xeroform hydrogel, 2x2 and profore lite, fungal cream    Dressing superior wound: santyl, 2x2 and profore lite. fungal cream    Dressing Lt: vaseline, xeroform  PT Short Term Goals - 07/12/21 1627       PT SHORT TERM GOAL #1   Title Patient will have at least 50% granualtion tissue.    Status Partially Met               PT Long Term Goals - 07/12/21 1627       PT LONG TERM GOAL #1   Title Patient's wound will be healed to reduce the risk of infection.    Status On-going      PT LONG TERM GOAL #2   Title Patient will be able to verbalize completion of daily skin checks to reduce the risk of further wound development.    Status On-going                    Patient will benefit from skilled therapeutic intervention in order to improve the following deficits and impairments:     Visit Diagnosis: Other abnormalities of gait and mobility  Ulcer of right pretibial region, limited to breakdown of skin 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   Aphasia    PAF (paroxysmal atrial fibrillation) ([place])    Diabetes mellitus type 2 in obese ([place])    Benign essential HTN     Hypothyroidism    Hyperlipidemia    Stroke (cerebrum) ([place]) 10/02/2017   Afib ([place]) 01/10/2017   SOB (shortness of breath) 71/03/2693   Acute diastolic CHF (congestive heart failure) ([place]) 01/10/2017   Atrial fibrillation with RVR ([place]) 01/10/2017   Claudication ([place]) 07/25/2016   Paroxysmal atrial fibrillation ([place]) 07/25/2016   Odynophagia 04/10/2014   Allergic rhinitis 03/23/2014   Chronic rhinitis 12/22/2013   Intrinsic asthma 07/15/2013   Constipation 08/22/2011   Obesity 09/20/2009   G E R D 11/29/2007   Celiac disease 11/29/2007   Ihor Austin, LPTA/CLT; CBIS [phone]  Aldona Lento, PTA 07/30/2021, 5:31 PM  [place], [place], [address] Phone: [phone]   Fax:  [phone]  Name: MAREO PORTILLA MRN: [id number] Date of Birth: Nov 01, 1928

## 2021-08-01 ENCOUNTER — Ambulatory Visit (HOSPITAL_COMMUNITY): Payer: PPO | Admitting: Physical Therapy

## 2021-08-01 ENCOUNTER — Other Ambulatory Visit: Payer: Self-pay

## 2021-08-01 ENCOUNTER — Encounter (HOSPITAL_COMMUNITY): Payer: Self-pay | Admitting: Physical Therapy

## 2021-08-01 DIAGNOSIS — R2689 Other abnormalities of gait and mobility: Secondary | ICD-10-CM

## 2021-08-01 DIAGNOSIS — E039 Hypothyroidism, unspecified: Secondary | ICD-10-CM | POA: Diagnosis not present

## 2021-08-01 DIAGNOSIS — L97811 Non-pressure chronic ulcer of other part of right lower leg limited to breakdown of skin: Secondary | ICD-10-CM | POA: Diagnosis not present

## 2021-08-01 DIAGNOSIS — E782 Mixed hyperlipidemia: Secondary | ICD-10-CM | POA: Diagnosis not present

## 2021-08-01 DIAGNOSIS — E118 Type 2 diabetes mellitus with unspecified complications: Secondary | ICD-10-CM | POA: Diagnosis not present

## 2021-08-01 NOTE — Therapy (Signed)
[place], [place], [address] Phone: [phone]   Fax:  [phone]  Wound Care Therapy  Patient Details  Name: [name] MRN: [id number] Date of Birth: 06-26-1929 Referring Provider (PT): [name]   Encounter Date: 08/01/2021   PT End of Session - 08/01/21 1449     Visit Number 25    Number of Visits 50    Date for PT Re-Evaluation 08/22/21    Authorization Type [employer] (no auth req, no visit limit)    Progress Note Due on Visit 83    PT Start Time 1400    PT Stop Time 1446    PT Time Calculation (min) 46 min    Activity Tolerance Patient tolerated treatment well    Behavior During Therapy [facility] for tasks assessed/performed             Past Medical History:  Diagnosis Date   Allergic rhinitis    Aortic stenosis    Asthmatic bronchitis    Celiac disease    Cellulitis    CKD (chronic kidney disease)    Diverticulosis    Fx. left wrist 1987   Gastric polyps    GERD (gastroesophageal reflux disease)    History of pneumonia    Hypertension    Hypothyroidism    Iron deficiency anemia    Melanoma ([place])    Neuropathy    PAF (paroxysmal atrial fibrillation) ([place])    Prostate cancer ([place])    REM sleep behavior disorder    RLS (restless legs syndrome)    Sleep apnea    Spasticity    Stroke ([place]) 09/2017   Tremor    Type 2 diabetes mellitus ([place])     Past Surgical History:  Procedure Laterality Date   APPENDECTOMY  1978   CARPAL TUNNEL RELEASE Left    CATARACT EXTRACTION  03/2011   bilateral    CHOLECYSTECTOMY  2001   COLONOSCOPY W/ BIOPSIES  04/27/2008   diverticulosis, duodenitis   FOOT SURGERY     HERNIA REPAIR  1994   bilateral   KNEE SURGERY Bilateral    x 2   LUNG SURGERY  2001   for infection   PROSTATECTOMY  2002   UPPER GASTROINTESTINAL ENDOSCOPY  04/27/2008   celiac disease, gastric polyps    There were no vitals filed for this  visit.               Wound Therapy - 08/01/21 0001     Subjective Nothing new, brought juxtafit to session.    Patient and Family Stated Goals wound to heal    Date of Onset 09/06/20    Prior Treatments MD and self care    Pain Score 0-No pain    Evaluation and Treatment Procedures Explained to Patient/Family Yes    Evaluation and Treatment Procedures agreed to    Wound Properties Date First Assessed: 07/19/21 Time First Assessed: 1505 Wound Type: Venous stasis ulcer Location: Pretibial Location Orientation: Left Wound Description (Comments): left leg wound Present on Admission: No Final Assessment Date: 08/01/21 Final Assessment Time: 1400   Dressing Type Impregnated gauze (bismuth);Compression wrap    Dressing Changed Changed    Dressing Change Frequency PRN    Wound Properties Date First Assessed: 04/26/21 Time First Assessed: 6712 Wound Type: Non-pressure wound Location: Leg Location Orientation: Right Wound Description (Comments): superior to original wound Present on Admission: No   Dressing Type Gauze (Comment);Compression wrap  Dressing Changed Changed    Dressing Status Old drainage    Dressing Change Frequency PRN    Site / Wound Assessment Yellow;Red;Pink    % Wound base Red or Granulating 50%    % Wound base Yellow/Fibrinous Exudate 50%    Peri-wound Assessment Edema;Erythema (blanchable)    Wound Length (cm) 1.7 cm    Wound Width (cm) 1.2 cm    Wound Surface Area (cm^2) 2.04 cm^2    Drainage Amount Minimal    Drainage Description Serosanguineous    Treatment Cleansed;Debridement (Selective)    Wound Properties Date First Assessed: 12/04/20 Time First Assessed: 1610 Wound Type: Venous stasis ulcer Location: Leg Location Orientation: Right Present on Admission: Yes   Dressing Type Hydrogel    Dressing Changed Changed    Dressing Status Old drainage    Dressing Change Frequency PRN    Site / Wound Assessment Granulation tissue    % Wound base Red or  Granulating 100%    Peri-wound Assessment Edema;Erythema (blanchable)    Wound Length (cm) 0.2 cm    Wound Width (cm) 0.2 cm    Wound Surface Area (cm^2) 0.04 cm^2    Drainage Amount Minimal    Drainage Description Serous    Treatment Cleansed;Debridement (Selective)    Selective Debridement - Location wound beds Rt LE only    Selective Debridement - Tools Used Scalpel;Forceps    Selective Debridement - Tissue Removed slough    Wound Therapy - Clinical Statement Patient's LLE wound has healed completely. Placed juxtafit on at end of session for patient on LLE. RLE inferior wound continues to appear smaller and superior wound same size as last week. Patient with increased RLE edema today and weeping. Continued with Vaseline around wounds and fungal cream to LE. Patient with bleeding area in superior shin region. Placed xeroform over bleeding region as it was all granulation tissue. Superior wound with slough difficult to debride secondary to pain and is mostly dominated by slough. Continued with xeroform and hydrogel to inferior wound. Continued with profore light with extra cotton. Will continue to benefit from skilled physical therapy in order to reduce impairment and improve function.    Wound Therapy - Functional Problem List gait and balance    Factors Delaying/Impairing Wound Healing Diabetes Mellitus;Vascular compromise;Altered sensation    Wound Therapy - Frequency 2X / week    Wound Plan Continue appropriate wound care and dressings.  Trial of antifungal cream to Rt LE to see if clears up raw patches; instructed wife to bring.    Dressing  inferior wound xeroform hydrogel, 2x2 and profore lite, fungal cream    Dressing superior wound: santyl, 2x2 and profore lite. fungal cream                       PT Short Term Goals - 07/12/21 1627       PT SHORT TERM GOAL #1   Title Patient will have at least 50% granualtion tissue.    Status Partially Met               PT  Long Term Goals - 07/12/21 1627       PT LONG TERM GOAL #1   Title Patient's wound will be healed to reduce the risk of infection.    Status On-going      PT LONG TERM GOAL #2   Title Patient will be able to verbalize completion of daily skin checks to reduce the risk of further  wound development.    Status On-going                   Plan - 08/01/21 1449     Clinical Impression Statement See above    Personal Factors and Comorbidities Age;Comorbidity 3+;Past/Current Experience;Fitness;Time since onset of injury/illness/exacerbation    Comorbidities DM, CKD, neuorpathy, Hx CVA    Examination-Activity Limitations Locomotion Level;Transfers;Squat;Stand;Dressing    Examination-Participation Restrictions Community Activity    Stability/Clinical Decision Making Stable/Uncomplicated    Rehab Potential Good    PT Frequency 2x / week    PT Treatment/Interventions Gait training;Stair training;Functional mobility training;DME Instruction;Therapeutic activities;Therapeutic exercise;Balance training;Neuromuscular re-education;Patient/family education;Compression bandaging;Manual lymph drainage;Manual techniques    PT Next Visit Plan Continue with both chemical and mechanical debridement to remove adherent eschar to allow healing to occur             Patient will benefit from skilled therapeutic intervention in order to improve the following deficits and impairments:  Abnormal gait, Decreased skin integrity, Decreased mobility, Impaired sensation  Visit Diagnosis: Other abnormalities of gait and mobility  Ulcer of right pretibial region, limited to breakdown of skin [facility])     Problem List Patient Active Problem List   Diagnosis Date Noted   Advanced nonexudative age-related macular degeneration of right eye without subfoveal involvement 11/15/2020   Advanced nonexudative age-related macular degeneration of left eye without subfoveal involvement 11/15/2020   Diabetes mellitus  without complication ([place]) 04/11/1600   Posterior vitreous detachment of both eyes 11/15/2020   Gait disturbance, post-stroke 11/02/2017   Aphasia as late effect of cerebrovascular accident 11/02/2017   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   Aphasia    PAF (paroxysmal atrial fibrillation) ([place])    Diabetes mellitus type 2 in obese ([place])    Benign essential HTN    Hypothyroidism    Hyperlipidemia    Stroke (cerebrum) ([place]) 10/02/2017   Afib ([place]) 01/10/2017   SOB (shortness of breath) 09/32/3557   Acute diastolic CHF (congestive heart failure) ([place]) 01/10/2017   Atrial fibrillation with RVR ([place]) 01/10/2017   Claudication ([place]) 07/25/2016   Paroxysmal atrial fibrillation ([place]) 07/25/2016   Odynophagia 04/10/2014   Allergic rhinitis 03/23/2014   Chronic rhinitis 12/22/2013   Intrinsic asthma 07/15/2013   Constipation 08/22/2011   Obesity 09/20/2009   G E R D 11/29/2007   Celiac disease 11/29/2007    3:11 PM, 08/01/21 Mearl Latin PT, DPT Physical Therapist at [facility] [address] [place], [place], [address] Phone: [phone]   Fax:  [phone]  Name: [name] MRN: [id number] Date of Birth: [birth year]

## 2021-08-06 ENCOUNTER — Encounter (HOSPITAL_COMMUNITY): Payer: Self-pay | Admitting: Physical Therapy

## 2021-08-06 ENCOUNTER — Ambulatory Visit (HOSPITAL_COMMUNITY): Payer: PPO | Admitting: Physical Therapy

## 2021-08-06 ENCOUNTER — Other Ambulatory Visit: Payer: Self-pay

## 2021-08-06 DIAGNOSIS — E875 Hyperkalemia: Secondary | ICD-10-CM | POA: Diagnosis not present

## 2021-08-06 DIAGNOSIS — I482 Chronic atrial fibrillation, unspecified: Secondary | ICD-10-CM | POA: Diagnosis not present

## 2021-08-06 DIAGNOSIS — L97811 Non-pressure chronic ulcer of other part of right lower leg limited to breakdown of skin: Secondary | ICD-10-CM

## 2021-08-06 DIAGNOSIS — K900 Celiac disease: Secondary | ICD-10-CM | POA: Diagnosis not present

## 2021-08-06 DIAGNOSIS — E039 Hypothyroidism, unspecified: Secondary | ICD-10-CM | POA: Diagnosis not present

## 2021-08-06 DIAGNOSIS — I5032 Chronic diastolic (congestive) heart failure: Secondary | ICD-10-CM | POA: Diagnosis not present

## 2021-08-06 DIAGNOSIS — E118 Type 2 diabetes mellitus with unspecified complications: Secondary | ICD-10-CM | POA: Diagnosis not present

## 2021-08-06 DIAGNOSIS — R2689 Other abnormalities of gait and mobility: Secondary | ICD-10-CM

## 2021-08-06 DIAGNOSIS — I129 Hypertensive chronic kidney disease with stage 1 through stage 4 chronic kidney disease, or unspecified chronic kidney disease: Secondary | ICD-10-CM | POA: Diagnosis not present

## 2021-08-06 DIAGNOSIS — N1832 Chronic kidney disease, stage 3b: Secondary | ICD-10-CM | POA: Diagnosis not present

## 2021-08-06 DIAGNOSIS — E782 Mixed hyperlipidemia: Secondary | ICD-10-CM | POA: Diagnosis not present

## 2021-08-06 DIAGNOSIS — I69959 Hemiplegia and hemiparesis following unspecified cerebrovascular disease affecting unspecified side: Secondary | ICD-10-CM | POA: Diagnosis not present

## 2021-08-06 DIAGNOSIS — D638 Anemia in other chronic diseases classified elsewhere: Secondary | ICD-10-CM | POA: Diagnosis not present

## 2021-08-06 DIAGNOSIS — Z0001 Encounter for general adult medical examination with abnormal findings: Secondary | ICD-10-CM | POA: Diagnosis not present

## 2021-08-06 NOTE — Therapy (Signed)
[place], [place], [address] Phone: [phone]   Fax:  [phone]  Wound Care Therapy  Patient Details  Name: [name] MRN: [id number] Date of Birth: Oct 16, 1928 Referring Provider (PT): [name]   Encounter Date: 08/06/2021   PT End of Session - 08/06/21 1313     Visit Number 16    Number of Visits 72    Date for PT Re-Evaluation 08/22/21    Authorization Type [employer] (no auth req, no visit limit)    Progress Note Due on Visit 83    PT Start Time 1315    PT Stop Time 1355    PT Time Calculation (min) 40 min    Activity Tolerance Patient tolerated treatment well    Behavior During Therapy [facility] for tasks assessed/performed             Past Medical History:  Diagnosis Date   Allergic rhinitis    Aortic stenosis    Asthmatic bronchitis    Celiac disease    Cellulitis    CKD (chronic kidney disease)    Diverticulosis    Fx. left wrist 1987   Gastric polyps    GERD (gastroesophageal reflux disease)    History of pneumonia    Hypertension    Hypothyroidism    Iron deficiency anemia    Melanoma ([place])    Neuropathy    PAF (paroxysmal atrial fibrillation) ([facility])    Prostate cancer ([place])    REM sleep behavior disorder    RLS (restless legs syndrome)    Sleep apnea    Spasticity    Stroke ([place]) 09/2017   Tremor    Type 2 diabetes mellitus ([place])     Past Surgical History:  Procedure Laterality Date   APPENDECTOMY  1978   CARPAL TUNNEL RELEASE Left    CATARACT EXTRACTION  03/2011   bilateral    CHOLECYSTECTOMY  2001   COLONOSCOPY W/ BIOPSIES  04/27/2008   diverticulosis, duodenitis   FOOT SURGERY     HERNIA REPAIR  1994   bilateral   KNEE SURGERY Bilateral    x 2   LUNG SURGERY  2001   for infection   PROSTATECTOMY  2002   UPPER GASTROINTESTINAL ENDOSCOPY  04/27/2008   celiac disease, gastric polyps    There were no vitals filed for this visit.      [facility] PT  Assessment - 08/06/21 0001       Assessment   Medical Diagnosis Chronic venous stasis wound    Referring Provider (PT) [name]                     Wound Therapy - 08/06/21 0001     Subjective Wearing Juxtafit on LLE, R leg feeling alright. sore after last time.    Patient and Family Stated Goals wound to heal    Date of Onset 09/06/20    Prior Treatments MD and self care    Pain Score 0-No pain    Evaluation and Treatment Procedures Explained to Patient/Family Yes    Evaluation and Treatment Procedures agreed to    Wound Properties Date First Assessed: 04/26/21 Time First Assessed: 7048 Wound Type: Non-pressure wound Location: Leg Location Orientation: Right Wound Description (Comments): superior to original wound Present on Admission: No   Dressing Type Gauze (Comment);Compression wrap;Santyl    Dressing Changed Changed    Dressing Status Old drainage    Dressing Change Frequency  PRN    Site / Wound Assessment Yellow;Red;Pink    % Wound base Red or Granulating 10%    % Wound base Yellow/Fibrinous Exudate 90%    Peri-wound Assessment Edema;Erythema (non-blanchable)    Drainage Amount Minimal    Drainage Description Serosanguineous    Treatment Cleansed;Debridement (Selective)    Wound Properties Date First Assessed: 12/04/20 Time First Assessed: 1610 Wound Type: Venous stasis ulcer Location: Leg Location Orientation: Right Present on Admission: Yes   Dressing Type Hydrogel;Impregnated gauze (bismuth);Compression wrap    Dressing Changed Changed    Dressing Status Old drainage    Dressing Change Frequency PRN    Site / Wound Assessment Granulation tissue    % Wound base Red or Granulating 100%    Peri-wound Assessment Edema;Erythema (blanchable)    Drainage Amount Minimal    Drainage Description Serous    Treatment Cleansed;Debridement (Selective)    Selective Debridement - Location wound beds Rt LE only    Selective Debridement - Tools Used Forceps    Selective  Debridement - Tissue Removed slough    Wound Therapy - Clinical Statement Patient with continued redness, dry skin throughout RLE. Bloody spot from last time in proximal tibial region healed with several small scabs present. Tolerates debridement of inferior region well with 100% granulation following. Superior wound continues to be very painful and is mostly slough. Continued with same dressings as last session.    Wound Therapy - Functional Problem List gait and balance    Factors Delaying/Impairing Wound Healing Diabetes Mellitus;Vascular compromise;Altered sensation    Wound Therapy - Frequency 2X / week    Wound Plan Continue appropriate wound care and dressings.    Dressing  inferior wound xeroform hydrogel, 2x2 and profore lite, fungal cream    Dressing superior wound: santyl, 2x2 and profore lite. fungal cream                       PT Short Term Goals - 07/12/21 1627       PT SHORT TERM GOAL #1   Title Patient will have at least 50% granualtion tissue.    Status Partially Met               PT Long Term Goals - 07/12/21 1627       PT LONG TERM GOAL #1   Title Patient's wound will be healed to reduce the risk of infection.    Status On-going      PT LONG TERM GOAL #2   Title Patient will be able to verbalize completion of daily skin checks to reduce the risk of further wound development.    Status On-going                   Plan - 08/06/21 1313     Clinical Impression Statement see above    Personal Factors and Comorbidities Age;Comorbidity 3+;Past/Current Experience;Fitness;Time since onset of injury/illness/exacerbation    Comorbidities DM, CKD, neuorpathy, Hx CVA    Examination-Activity Limitations Locomotion Level;Transfers;Squat;Stand;Dressing    Examination-Participation Restrictions Community Activity    Stability/Clinical Decision Making Stable/Uncomplicated    Rehab Potential Good    PT Frequency 2x / week    PT  Treatment/Interventions Gait training;Stair training;Functional mobility training;DME Instruction;Therapeutic activities;Therapeutic exercise;Balance training;Neuromuscular re-education;Patient/family education;Compression bandaging;Manual lymph drainage;Manual techniques    PT Next Visit Plan Continue with both chemical and mechanical debridement to remove adherent eschar to allow healing to occur  Patient will benefit from skilled therapeutic intervention in order to improve the following deficits and impairments:  Abnormal gait, Decreased skin integrity, Decreased mobility, Impaired sensation  Visit Diagnosis: Other abnormalities of gait and mobility  Ulcer of right pretibial region, limited to breakdown of skin [facility])     Problem List Patient Active Problem List   Diagnosis Date Noted   Advanced nonexudative age-related macular degeneration of right eye without subfoveal involvement 11/15/2020   Advanced nonexudative age-related macular degeneration of left eye without subfoveal involvement 11/15/2020   Diabetes mellitus without complication ([place]) 16/07/9603   Posterior vitreous detachment of both eyes 11/15/2020   Gait disturbance, post-stroke 11/02/2017   Aphasia as late effect of cerebrovascular accident 11/02/2017   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   Aphasia    PAF (paroxysmal atrial fibrillation) ([place])    Diabetes mellitus type 2 in obese ([place])    Benign essential HTN    Hypothyroidism    Hyperlipidemia    Stroke (cerebrum) ([place]) 10/02/2017   Afib ([place]) 01/10/2017   SOB (shortness of breath) 54/06/8118   Acute diastolic CHF (congestive heart failure) ([place]) 01/10/2017   Atrial fibrillation with RVR ([place]) 01/10/2017   Claudication ([place]) 07/25/2016   Paroxysmal atrial fibrillation ([place]) 07/25/2016   Odynophagia 04/10/2014   Allergic rhinitis 03/23/2014   Chronic rhinitis 12/22/2013   Intrinsic asthma 07/15/2013   Constipation 08/22/2011    Obesity 09/20/2009   G E R D 11/29/2007   Celiac disease 11/29/2007    2:01 PM, 08/06/21 Mearl Latin PT, DPT Physical Therapist at [place], [place], [address] Phone: [phone]   Fax:  [phone]  Name: [name] MRN: [id number] Date of Birth: [birth year]

## 2021-08-08 ENCOUNTER — Ambulatory Visit (HOSPITAL_COMMUNITY): Payer: PPO | Admitting: Physical Therapy

## 2021-08-08 ENCOUNTER — Other Ambulatory Visit: Payer: Self-pay

## 2021-08-08 DIAGNOSIS — L97811 Non-pressure chronic ulcer of other part of right lower leg limited to breakdown of skin: Secondary | ICD-10-CM

## 2021-08-08 DIAGNOSIS — R2689 Other abnormalities of gait and mobility: Secondary | ICD-10-CM

## 2021-08-08 NOTE — Therapy (Signed)
[place], [place], [address] Phone: [phone]   Fax:  [phone]  Wound Care Therapy  Patient Details  Name: [name] MRN: [id number] Date of Birth: 1929/07/13 Referring Provider (PT): [name]   Encounter Date: 08/08/2021   PT End of Session - 08/08/21 1611     Visit Number 81    Number of Visits 46    Date for PT Re-Evaluation 08/22/21    Authorization Type [employer] (no auth req, no visit limit)    Progress Note Due on Visit 83    PT Start Time 1445    PT Stop Time 1530    PT Time Calculation (min) 45 min    Activity Tolerance Patient tolerated treatment well    Behavior During Therapy [facility] for tasks assessed/performed             Past Medical History:  Diagnosis Date   Allergic rhinitis    Aortic stenosis    Asthmatic bronchitis    Celiac disease    Cellulitis    CKD (chronic kidney disease)    Diverticulosis    Fx. left wrist 1987   Gastric polyps    GERD (gastroesophageal reflux disease)    History of pneumonia    Hypertension    Hypothyroidism    Iron deficiency anemia    Melanoma ([place])    Neuropathy    PAF (paroxysmal atrial fibrillation) ([place])    Prostate cancer ([place])    REM sleep behavior disorder    RLS (restless legs syndrome)    Sleep apnea    Spasticity    Stroke ([place]) 09/2017   Tremor    Type 2 diabetes mellitus ([place])     Past Surgical History:  Procedure Laterality Date   APPENDECTOMY  1978   CARPAL TUNNEL RELEASE Left    CATARACT EXTRACTION  03/2011   bilateral    CHOLECYSTECTOMY  2001   COLONOSCOPY W/ BIOPSIES  04/27/2008   diverticulosis, duodenitis   FOOT SURGERY     HERNIA REPAIR  1994   bilateral   KNEE SURGERY Bilateral    x 2   LUNG SURGERY  2001   for infection   PROSTATECTOMY  2002   UPPER GASTROINTESTINAL ENDOSCOPY  04/27/2008   celiac disease, gastric polyps    There were no vitals filed for this  visit.               Wound Therapy - 08/08/21 1544     Subjective pt reports going well with juxtafit on Lt LE.  STates his Rt leg hurts only at night when he has it up and keeps him from sleeping some nights.    Patient and Family Stated Goals wound to heal    Date of Onset 09/06/20    Prior Treatments MD and self care    Pain Score 0-No pain    Evaluation and Treatment Procedures Explained to Patient/Family Yes    Evaluation and Treatment Procedures agreed to    Wound Properties Date First Assessed: 04/26/21 Time First Assessed: 5498 Wound Type: Non-pressure wound Location: Leg Location Orientation: Right Wound Description (Comments): superior to original wound Present on Admission: No   Wound Image Images linked: 1    Dressing Type Santyl;Gauze (Comment)    Dressing Changed Changed    Dressing Status Old drainage    Dressing Change Frequency PRN    Site / Wound Assessment Yellow;Red;Pink    % Wound base  Red or Granulating 10%    % Wound base Yellow/Fibrinous Exudate 90%    Peri-wound Assessment Edema;Erythema (blanchable)    Wound Length (cm) 2 cm    Wound Width (cm) 1.3 cm    Wound Surface Area (cm^2) 2.6 cm^2    Drainage Amount Minimal    Drainage Description Serous    Treatment Cleansed;Debridement (Selective)    Wound Properties Date First Assessed: 12/04/20 Time First Assessed: 3094 Wound Type: Venous stasis ulcer Location: Leg Location Orientation: Right Present on Admission: Yes   Wound Image Images linked: 1    Dressing Changed Changed    Dressing Status Old drainage    Dressing Change Frequency PRN    Site / Wound Assessment Granulation tissue    % Wound base Red or Granulating 100%    Wound Length (cm) 1 cm    Wound Width (cm) 0.5 cm    Wound Surface Area (cm^2) 0.5 cm^2    Drainage Amount Minimal    Drainage Description Serous    Treatment Cleansed;Debridement (Selective)    Selective Debridement - Location wound beds Rt LE only    Selective  Debridement - Tools Used Forceps    Selective Debridement - Tissue Removed slough    Wound Therapy - Clinical Statement Pt with continued redness, irritation and peeling away of top layer of skin upon dressing removal.  Cleansed well with sterile saline and debrided both wounds on lateral LE.  Perimeter of inferior wound with devitalized tissue and noted hypergranualtion and serous drainage from this wound. Changed dressing over this wound to silver hydrofiber to see if this reduces the hypergranulation and promotes continued approximation of wound.  Actual measurements were larger today as compared to previous measurements but remains 100% granulated.  Superior wound remains most sensitive and difficult to debride.  Instructed pt to bring juxtafit for Rt LE next session with trial of this over regular gauze/netting that he can remove at night when sleeping.  Pt verbalized understanding.  Assess need to continue santyl or change next session as slough remains same percentage    Wound Therapy - Functional Problem List gait and balance    Factors Delaying/Impairing Wound Healing Diabetes Mellitus;Vascular compromise;Altered sensation    Wound Therapy - Frequency 2X / week    Wound Plan Continue appropriate wound care and dressings.  Trial of discontinuing profore on Rt LE and using juxtafit    Dressing  inferior wound xeroform hydrogel, 2x2 and profore lite, fungal cream    Dressing superior wound: santyl, 2x2 and profore lite. fungal cream                       PT Short Term Goals - 07/12/21 1627       PT SHORT TERM GOAL #1   Title Patient will have at least 50% granualtion tissue.    Status Partially Met               PT Long Term Goals - 07/12/21 1627       PT LONG TERM GOAL #1   Title Patient's wound will be healed to reduce the risk of infection.    Status On-going      PT LONG TERM GOAL #2   Title Patient will be able to verbalize completion of daily skin checks to  reduce the risk of further wound development.    Status On-going  Patient will benefit from skilled therapeutic intervention in order to improve the following deficits and impairments:     Visit Diagnosis: Other abnormalities of gait and mobility  Ulcer of right pretibial region, limited to breakdown of skin [facility])     Problem List Patient Active Problem List   Diagnosis Date Noted   Advanced nonexudative age-related macular degeneration of right eye without subfoveal involvement 11/15/2020   Advanced nonexudative age-related macular degeneration of left eye without subfoveal involvement 11/15/2020   Diabetes mellitus without complication ([place]) 20/80/2233   Posterior vitreous detachment of both eyes 11/15/2020   Gait disturbance, post-stroke 11/02/2017   Aphasia as late effect of cerebrovascular accident 11/02/2017   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) 07/25/2016   Paroxysmal atrial fibrillation ([place]) 07/25/2016   Odynophagia 04/10/2014   Allergic rhinitis 03/23/2014   Chronic rhinitis 12/22/2013   Intrinsic asthma 07/15/2013   Constipation 08/22/2011   Obesity 09/20/2009   G E R D 11/29/2007   Celiac disease 11/29/2007   Teena Irani, PTA/CLT, Lissa Morales [phone]  Teena Irani 08/08/2021, 4:12 PM  [place] [address] [place], [place], [address] Phone: [phone]   Fax:  [phone]  Name: [name] MRN: [id number] Date of Birth: 07-14-1929

## 2021-08-12 DIAGNOSIS — E782 Mixed hyperlipidemia: Secondary | ICD-10-CM | POA: Diagnosis not present

## 2021-08-12 DIAGNOSIS — I10 Essential (primary) hypertension: Secondary | ICD-10-CM | POA: Diagnosis not present

## 2021-08-13 ENCOUNTER — Ambulatory Visit (HOSPITAL_COMMUNITY): Payer: PPO | Attending: Internal Medicine | Admitting: Physical Therapy

## 2021-08-13 ENCOUNTER — Encounter (HOSPITAL_COMMUNITY): Payer: Self-pay | Admitting: Physical Therapy

## 2021-08-13 ENCOUNTER — Other Ambulatory Visit: Payer: Self-pay

## 2021-08-13 DIAGNOSIS — L97811 Non-pressure chronic ulcer of other part of right lower leg limited to breakdown of skin: Secondary | ICD-10-CM | POA: Diagnosis not present

## 2021-08-13 DIAGNOSIS — R2689 Other abnormalities of gait and mobility: Secondary | ICD-10-CM | POA: Insufficient documentation

## 2021-08-13 NOTE — Therapy (Signed)
[place], [place], [address] Phone: [phone]   Fax:  [phone]  Wound Care Therapy  Patient Details  Name: [name] MRN: [id number] Date of Birth: 04/24/1929 Referring Provider (PT): [name]   Encounter Date: 08/13/2021   PT End of Session - 08/13/21 1604     Visit Number 82    Number of Visits 44    Date for PT Re-Evaluation 08/22/21    Authorization Type [employer] (no auth req, no visit limit)    Progress Note Due on Visit 83    PT Start Time 1450    PT Stop Time 1525    PT Time Calculation (min) 35 min    Activity Tolerance Patient tolerated treatment well    Behavior During Therapy [facility] for tasks assessed/performed             Past Medical History:  Diagnosis Date   Allergic rhinitis    Aortic stenosis    Asthmatic bronchitis    Celiac disease    Cellulitis    CKD (chronic kidney disease)    Diverticulosis    Fx. left wrist 1987   Gastric polyps    GERD (gastroesophageal reflux disease)    History of pneumonia    Hypertension    Hypothyroidism    Iron deficiency anemia    Melanoma ([place])    Neuropathy    PAF (paroxysmal atrial fibrillation) ([place])    Prostate cancer ([place])    REM sleep behavior disorder    RLS (restless legs syndrome)    Sleep apnea    Spasticity    Stroke ([place]) 09/2017   Tremor    Type 2 diabetes mellitus ([place])     Past Surgical History:  Procedure Laterality Date   APPENDECTOMY  1978   CARPAL TUNNEL RELEASE Left    CATARACT EXTRACTION  03/2011   bilateral    CHOLECYSTECTOMY  2001   COLONOSCOPY W/ BIOPSIES  04/27/2008   diverticulosis, duodenitis   FOOT SURGERY     HERNIA REPAIR  1994   bilateral   KNEE SURGERY Bilateral    x 2   LUNG SURGERY  2001   for infection   PROSTATECTOMY  2002   UPPER GASTROINTESTINAL ENDOSCOPY  04/27/2008   celiac disease, gastric polyps    There were no vitals filed for this  visit.               Wound Therapy - 08/13/21 1554     Subjective No issues or complaints.  Rt LE still hurts at night when he lays down and tries to sleep.  pt brought new antifungal cream today and his juxtafit.    Patient and Family Stated Goals wound to heal    Date of Onset 09/06/20    Prior Treatments MD and self care    Evaluation and Treatment Procedures Explained to Patient/Family Yes    Evaluation and Treatment Procedures agreed to    Wound Properties Date First Assessed: 04/26/21 Time First Assessed: 4765 Wound Type: Non-pressure wound Location: Leg Location Orientation: Right Wound Description (Comments): superior to original wound Present on Admission: No   Dressing Type Santyl    Dressing Changed Changed    Dressing Status Old drainage    Dressing Change Frequency PRN    % Wound base Red or Granulating 40%    % Wound base Yellow/Fibrinous Exudate 60%    Peri-wound Assessment Erythema (blanchable);Edema    Drainage Amount  Minimal    Drainage Description Serous    Treatment Cleansed;Debridement (Selective)    Wound Properties Date First Assessed: 12/04/20 Time First Assessed: 2130 Wound Type: Venous stasis ulcer Location: Leg Location Orientation: Right Present on Admission: Yes   Dressing Type Silver hydrofiber    Dressing Changed Changed    Dressing Status Old drainage    Dressing Change Frequency PRN    Site / Wound Assessment Granulation tissue    % Wound base Red or Granulating 100%    Peri-wound Assessment Edema;Erythema (blanchable)    Drainage Amount Scant    Drainage Description Serous    Treatment Cleansed;Debridement (Selective)    Selective Debridement - Location wound beds Rt LE only    Selective Debridement - Tools Used Forceps    Selective Debridement - Tissue Removed slough    Wound Therapy - Clinical Statement Rt LE appears to be improved with less redness/irritation.  Mostly dry medially and raw around wounds on lateral sides. Cleansed well  with sterile saline and applied antifungal only (no vaseline or lotion) following cleansing.  Both wounds with approximation and superior wound with less slough as compared to last session. Inferionr wound without hypergranulation this session and minimal opening remaining.  continued with silver hydrofiber to inferior and santyl to superior wounds.  Kerlix used follwoed by #5 netting with pt's juxtafit placed over this.  Instructed pt and spouse to remove juxtafit at night to see if this helps with his night pain and replace in the morning.  Pt and spouse verbalized understanding.    Wound Therapy - Functional Problem List gait and balance    Factors Delaying/Impairing Wound Healing Diabetes Mellitus;Vascular compromise;Altered sensation    Wound Therapy - Frequency 2X / week    Wound Plan Continue appropriate wound care and dressings.  Trial of discontinuing profore on Rt LE and using juxtafit but return if not improving or has increased edema.  Measure weekly.    Dressing  inferior wound: silver hydrofiber, 2x2, 4X4, kerlix, #5 netting, juxtafit    Dressing superior wound: santyl, 2x2, 4X4,kerlix and #5 netting.  Juxtafit                       PT Short Term Goals - 07/12/21 1627       PT SHORT TERM GOAL #1   Title Patient will have at least 50% granualtion tissue.    Status Partially Met               PT Long Term Goals - 07/12/21 1627       PT LONG TERM GOAL #1   Title Patient's wound will be healed to reduce the risk of infection.    Status On-going      PT LONG TERM GOAL #2   Title Patient will be able to verbalize completion of daily skin checks to reduce the risk of further wound development.    Status On-going                    Patient will benefit from skilled therapeutic intervention in order to improve the following deficits and impairments:     Visit Diagnosis: Other abnormalities of gait and mobility  Ulcer of right pretibial region,  limited to breakdown of skin [facility])     Problem List Patient Active Problem List   Diagnosis Date Noted   Advanced nonexudative age-related macular degeneration of right eye without subfoveal involvement 11/15/2020   Advanced nonexudative age-related  macular degeneration of left eye without subfoveal involvement 11/15/2020   Diabetes mellitus without complication ([place]) 01/60/1093   Posterior vitreous detachment of both eyes 11/15/2020   Gait disturbance, post-stroke 11/02/2017   Aphasia as late effect of cerebrovascular accident 11/02/2017   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   Aphasia    PAF (paroxysmal atrial fibrillation) ([place])    Diabetes mellitus type 2 in obese ([place])    Benign essential HTN    Hypothyroidism    Hyperlipidemia    Stroke (cerebrum) ([place]) 10/02/2017   Afib ([place]) 01/10/2017   SOB (shortness of breath) 23/55/7322   Acute diastolic CHF (congestive heart failure) ([place]) 01/10/2017   Atrial fibrillation with RVR ([place]) 01/10/2017   Claudication ([place]) 07/25/2016   Paroxysmal atrial fibrillation ([place]) 07/25/2016   Odynophagia 04/10/2014   Allergic rhinitis 03/23/2014   Chronic rhinitis 12/22/2013   Intrinsic asthma 07/15/2013   Constipation 08/22/2011   Obesity 09/20/2009   G E R D 11/29/2007   Celiac disease 11/29/2007   Teena Irani, PTA/CLT, Lissa Morales [phone]  Teena Irani 08/13/2021, 4:05 PM  [place] [address] [place], [place], [address] Phone: [phone]   Fax:  [phone]  Name: [name] MRN: [id number] Date of Birth: 11/13/1928

## 2021-08-16 ENCOUNTER — Encounter (HOSPITAL_COMMUNITY): Payer: Self-pay | Admitting: Physical Therapy

## 2021-08-16 ENCOUNTER — Other Ambulatory Visit: Payer: Self-pay

## 2021-08-16 ENCOUNTER — Ambulatory Visit (HOSPITAL_COMMUNITY): Payer: PPO | Admitting: Physical Therapy

## 2021-08-16 DIAGNOSIS — R2689 Other abnormalities of gait and mobility: Secondary | ICD-10-CM

## 2021-08-16 DIAGNOSIS — L97811 Non-pressure chronic ulcer of other part of right lower leg limited to breakdown of skin: Secondary | ICD-10-CM

## 2021-08-16 NOTE — Therapy (Signed)
[place], [place], [address] Phone: [phone]   Fax:  [phone]  Wound Care Therapy and Progress Note  Patient Details  Name: [name] MRN: [id number] Date of Birth: June 25, 1929 Referring Provider (PT): [name]  Progress Note Reporting Period 07/12/21 to 08/16/21  See note below for Objective Data and Assessment of Progress/Goals.      Encounter Date: 08/16/2021   PT End of Session - 08/16/21 1444     Visit Number 21    Number of Visits 50    Date for PT Re-Evaluation 08/22/21    Authorization Type [employer] (no auth req, no visit limit)    Progress Note Due on Visit 93    PT Start Time 1445    PT Stop Time 1525    PT Time Calculation (min) 40 min    Activity Tolerance Patient tolerated treatment well    Behavior During Therapy [facility] for tasks assessed/performed             Past Medical History:  Diagnosis Date   Allergic rhinitis    Aortic stenosis    Asthmatic bronchitis    Celiac disease    Cellulitis    CKD (chronic kidney disease)    Diverticulosis    Fx. left wrist 1987   Gastric polyps    GERD (gastroesophageal reflux disease)    History of pneumonia    Hypertension    Hypothyroidism    Iron deficiency anemia    Melanoma ([facility])    Neuropathy    PAF (paroxysmal atrial fibrillation) ([facility])    Prostate cancer ([place])    REM sleep behavior disorder    RLS (restless legs syndrome)    Sleep apnea    Spasticity    Stroke ([place]) 09/2017   Tremor    Type 2 diabetes mellitus ([place])     Past Surgical History:  Procedure Laterality Date   APPENDECTOMY  1978   CARPAL TUNNEL RELEASE Left    CATARACT EXTRACTION  03/2011   bilateral    CHOLECYSTECTOMY  2001   COLONOSCOPY W/ BIOPSIES  04/27/2008   diverticulosis, duodenitis   FOOT SURGERY     HERNIA REPAIR  1994   bilateral   KNEE SURGERY Bilateral    x 2   LUNG SURGERY  2001   for infection   PROSTATECTOMY  2002    UPPER GASTROINTESTINAL ENDOSCOPY  04/27/2008   celiac disease, gastric polyps    There were no vitals filed for this visit.      [facility] PT Assessment - 08/16/21 0001       Assessment   Medical Diagnosis Chronic venous stasis wound    Referring Provider (PT) [name]                     Wound Therapy - 08/16/21 0001     Subjective Reports improved comfort with removal of juxtafit at night. States bandage did slide down a bit.    Patient and Family Stated Goals wound to heal    Date of Onset 09/06/20    Prior Treatments MD and self care    Evaluation and Treatment Procedures Explained to Patient/Family Yes    Evaluation and Treatment Procedures agreed to    Wound Properties Date First Assessed: 04/26/21 Time First Assessed: 9326 Wound Type: Non-pressure wound Location: Leg Location Orientation: Right Wound Description (Comments): superior to original wound Present on Admission: No   Dressing Type --  santyl   Dressing Changed Changed    Dressing Status Old drainage    Dressing Change Frequency PRN    Site / Wound Assessment Yellow;Pink    % Wound base Red or Granulating 15%    % Wound base Yellow/Fibrinous Exudate 85%    Peri-wound Assessment Edema;Erythema (blanchable)   dry skin   Wound Length (cm) 1.8 cm   was 2   Wound Width (cm) 1.3 cm   was 1.3   Wound Depth (cm) 0.2 cm    Wound Volume (cm^3) 0.47 cm^3    Wound Surface Area (cm^2) 2.34 cm^2    Margins Unattached edges (unapproximated)    Drainage Amount Minimal    Drainage Description Serous    Treatment Cleansed;Debridement (Selective)    Wound Properties Date First Assessed: 12/04/20 Time First Assessed: 1505 Wound Type: Venous stasis ulcer Location: Leg Location Orientation: Right Present on Admission: Yes   Dressing Type Silver hydrofiber    Dressing Changed Changed    Dressing Status Old drainage    Dressing Change Frequency PRN    Site / Wound Assessment Granulation tissue    % Wound base Red or  Granulating 100%    Peri-wound Assessment Edema;Erythema (blanchable)   dry skin   Wound Length (cm) 1 cm   was 1   Wound Width (cm) 0.5 cm   was .5   Wound Depth (cm) 0.1 cm    Wound Volume (cm^3) 0.05 cm^3    Wound Surface Area (cm^2) 0.5 cm^2    Margins Unattached edges (unapproximated)    Drainage Amount Scant    Drainage Description Serous    Treatment Cleansed;Debridement (Selective)    Selective Debridement - Location wound beds Rt LE only    Selective Debridement - Tools Used Forceps;Scalpel    Selective Debridement - Tissue Removed slough    Wound Therapy - Clinical Statement Leg and wounds very dry on this date. Applied lotion and then fungal cream to leg. Added hydrogel to wound beds. Dressing slid down so placed medipore tape at creases of kerlix to keep if from sliding down when taking on/off the juxtafix. Patient reported overall comfort in dressing. Minimal progress towards goals but original wound almost healed. Poor tolerance to debridement on superior wound. Will continue with current POC.    Wound Therapy - Functional Problem List gait and balance    Factors Delaying/Impairing Wound Healing Diabetes Mellitus;Vascular compromise;Altered sensation    Wound Therapy - Frequency 2X / week    Wound Plan Continue appropriate wound care and dressings.  Trial of discontinuing profore on Rt LE and using juxtafit but return if not improving or has increased edema.  Measure weekly.    Dressing  inferior wound: xeroform, 4X4, kerlix, medipore tape #5 netting, juxtafit    Dressing superior wound: santyl, hydrogel, 2x2, 4X4,kerlix, medipore tape and #5 netting.  Juxtafit                       PT Short Term Goals - 07/12/21 1627       PT SHORT TERM GOAL #1   Title Patient will have at least 50% granualtion tissue.    Status Partially Met               PT Long Term Goals - 07/12/21 1627       PT LONG TERM GOAL #1   Title Patient's wound will be healed to  reduce the risk of infection.    Status On-going  PT LONG TERM GOAL #2   Title Patient will be able to verbalize completion of daily skin checks to reduce the risk of further wound development.    Status On-going                    Patient will benefit from skilled therapeutic intervention in order to improve the following deficits and impairments:     Visit Diagnosis: Other abnormalities of gait and mobility  Ulcer of right pretibial region, limited to breakdown of skin [facility])     Problem List Patient Active Problem List   Diagnosis Date Noted   Advanced nonexudative age-related macular degeneration of right eye without subfoveal involvement 11/15/2020   Advanced nonexudative age-related macular degeneration of left eye without subfoveal involvement 11/15/2020   Diabetes mellitus without complication ([place]) 77/12/4033   Posterior vitreous detachment of both eyes 11/15/2020   Gait disturbance, post-stroke 11/02/2017   Aphasia as late effect of cerebrovascular accident 11/02/2017   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   Aphasia    PAF (paroxysmal atrial fibrillation) ([place])    Diabetes mellitus type 2 in obese ([place])    Benign essential HTN    Hypothyroidism    Hyperlipidemia    Stroke (cerebrum) ([place]) 10/02/2017   Afib ([place]) 01/10/2017   SOB (shortness of breath) 24/81/8590   Acute diastolic CHF (congestive heart failure) ([place]) 01/10/2017   Atrial fibrillation with RVR ([place]) 01/10/2017   Claudication ([place]) 07/25/2016   Paroxysmal atrial fibrillation ([place]) 07/25/2016   Odynophagia 04/10/2014   Allergic rhinitis 03/23/2014   Chronic rhinitis 12/22/2013   Intrinsic asthma 07/15/2013   Constipation 08/22/2011   Obesity 09/20/2009   G E R D 11/29/2007   Celiac disease 11/29/2007   3:33 PM, 08/16/21 Jerene Pitch, DPT Physical Therapy with [facility]  [phone] office   [place] [address] [place], [place], [address] Phone: [phone]   Fax:  [phone]  Name: [name] MRN: [id number] Date of Birth: Oct 15, 1928

## 2021-08-20 ENCOUNTER — Other Ambulatory Visit: Payer: Self-pay

## 2021-08-20 ENCOUNTER — Ambulatory Visit (HOSPITAL_COMMUNITY): Payer: PPO | Admitting: Physical Therapy

## 2021-08-20 ENCOUNTER — Encounter (HOSPITAL_COMMUNITY): Payer: Self-pay | Admitting: Physical Therapy

## 2021-08-20 DIAGNOSIS — L97811 Non-pressure chronic ulcer of other part of right lower leg limited to breakdown of skin: Secondary | ICD-10-CM

## 2021-08-20 DIAGNOSIS — R2689 Other abnormalities of gait and mobility: Secondary | ICD-10-CM

## 2021-08-20 NOTE — Therapy (Signed)
[name], [place], [address] Phone: [phone]   Fax:  [phone]  Wound Care Therapy  Patient Details  Name: [name] MRN: [id number] Date of Birth: June 11, 1929 Referring Provider (PT): [name]   Encounter Date: 08/20/2021   PT End of Session - 08/20/21 1536     Visit Number 11    Number of Visits 25    Date for PT Re-Evaluation 10/01/21    Authorization Type [employer] (no auth req, no visit limit)    Progress Note Due on Visit 93    PT Start Time 1445    PT Stop Time 1530    PT Time Calculation (min) 45 min    Activity Tolerance Patient tolerated treatment well    Behavior During Therapy [facility] for tasks assessed/performed             Past Medical History:  Diagnosis Date   Allergic rhinitis    Aortic stenosis    Asthmatic bronchitis    Celiac disease    Cellulitis    CKD (chronic kidney disease)    Diverticulosis    Fx. left wrist 1987   Gastric polyps    GERD (gastroesophageal reflux disease)    History of pneumonia    Hypertension    Hypothyroidism    Iron deficiency anemia    Melanoma ([place])    Neuropathy    PAF (paroxysmal atrial fibrillation) ([place])    Prostate cancer ([place])    REM sleep behavior disorder    RLS (restless legs syndrome)    Sleep apnea    Spasticity    Stroke ([place]) 09/2017   Tremor    Type 2 diabetes mellitus ([place])     Past Surgical History:  Procedure Laterality Date   APPENDECTOMY  1978   CARPAL TUNNEL RELEASE Left    CATARACT EXTRACTION  03/2011   bilateral    CHOLECYSTECTOMY  2001   COLONOSCOPY W/ BIOPSIES  04/27/2008   diverticulosis, duodenitis   FOOT SURGERY     HERNIA REPAIR  1994   bilateral   KNEE SURGERY Bilateral    x 2   LUNG SURGERY  2001   for infection   PROSTATECTOMY  2002   UPPER GASTROINTESTINAL ENDOSCOPY  04/27/2008   celiac disease, gastric polyps    There were no vitals filed for this visit.      [facility] PT  Assessment - 08/20/21 0001       Assessment   Medical Diagnosis Chronic venous stasis wound    Referring Provider (PT) [name]                     Wound Therapy - 08/20/21 0001     Subjective His leg hurt really bad for 2 days following last session. dressing slid down some.    Patient and Family Stated Goals wound to heal    Date of Onset 09/06/20    Prior Treatments MD and self care    Pain Score 0-No pain    Evaluation and Treatment Procedures Explained to Patient/Family Yes    Evaluation and Treatment Procedures agreed to    Wound Properties Date First Assessed: 04/26/21 Time First Assessed: 6759 Wound Type: Non-pressure wound Location: Leg Location Orientation: Right Wound Description (Comments): superior to original wound Present on Admission: No   Wound Image Images linked: 1    Dressing Type Santyl    Dressing Changed Changed    Dressing  Status Old drainage    Dressing Change Frequency PRN    Site / Wound Assessment Yellow;Pink    % Wound base Red or Granulating 30%    % Wound base Yellow/Fibrinous Exudate 70%    Peri-wound Assessment Erythema (blanchable);Edema   dry skin   Margins Unattached edges (unapproximated)    Drainage Amount Minimal    Drainage Description Serous    Treatment Cleansed;Debridement (Selective)    Wound Properties Date First Assessed: 12/04/20 Time First Assessed: 4166 Wound Type: Venous stasis ulcer Location: Leg Location Orientation: Right Present on Admission: Yes   Dressing Type Impregnated gauze (bismuth)    Dressing Changed Changed    Dressing Status Old drainage    Dressing Change Frequency PRN    Site / Wound Assessment Granulation tissue    % Wound base Red or Granulating 100%    Peri-wound Assessment Edema;Erythema (blanchable)    Margins Unattached edges (unapproximated)    Drainage Amount Scant    Drainage Description Serous    Treatment Cleansed;Debridement (Selective)    Selective Debridement - Location wound beds  Rt LE only    Selective Debridement - Tools Used Forceps;Scalpel    Selective Debridement - Tissue Removed slough    Wound Therapy - Clinical Statement Patient dressing slid down some since last session with increased LE edema. Inferior wound appearing to increase in size but with less depth and decreased punched out appearance. Superior wound with improving granulation but debridement remains limited due to pain. Began with lotion at beginning of session due to continued flaking skin. Following debridement, applied fungal cream followed by Vaseline. Continued with xeroform to inferior wound and santly and hydrogel to superior wound. Then dressed with kerlix, coban, and #5 netting to see if patient tolerates this better than profore lite as dressing with juxtafit did not perform well after last session. Extending POC to continue promote wound healing.    Wound Therapy - Functional Problem List gait and balance    Factors Delaying/Impairing Wound Healing Diabetes Mellitus;Vascular compromise;Altered sensation    Wound Therapy - Frequency 2X / week    Wound Plan Continue appropriate wound care and dressings.  Trial of discontinuing profore on Rt LE and using juxtafit but return if not improving or has increased edema.  Measure weekly.    Dressing  inferior wound: xeroform, 4X4, kerlix, coban netting    Dressing superior wound: santyl, hydrogel, 2x2, 4X4,kerlix, coban, netting                       PT Short Term Goals - 07/12/21 1627       PT SHORT TERM GOAL #1   Title Patient will have at least 50% granualtion tissue.    Status Partially Met               PT Long Term Goals - 07/12/21 1627       PT LONG TERM GOAL #1   Title Patient's wound will be healed to reduce the risk of infection.    Status On-going      PT LONG TERM GOAL #2   Title Patient will be able to verbalize completion of daily skin checks to reduce the risk of further wound development.    Status On-going                    Plan - 08/20/21 1537     Clinical Impression Statement see above    Personal Factors and Comorbidities  Age;Comorbidity 3+;Past/Current Experience;Fitness;Time since onset of injury/illness/exacerbation    Comorbidities DM, CKD, neuorpathy, Hx CVA    Examination-Activity Limitations Locomotion Level;Transfers;Squat;Stand;Dressing    Examination-Participation Restrictions Community Activity    Stability/Clinical Decision Making Stable/Uncomplicated    Rehab Potential Good    PT Frequency 2x / week    PT Duration 6 weeks    PT Treatment/Interventions Gait training;Stair training;Functional mobility training;DME Instruction;Therapeutic activities;Therapeutic exercise;Balance training;Neuromuscular re-education;Patient/family education;Compression bandaging;Manual lymph drainage;Manual techniques    PT Next Visit Plan Continue with both chemical and mechanical debridement to remove adherent eschar to allow healing to occur             Patient will benefit from skilled therapeutic intervention in order to improve the following deficits and impairments:  Abnormal gait, Decreased skin integrity, Decreased mobility, Impaired sensation  Visit Diagnosis: Other abnormalities of gait and mobility  Ulcer of right pretibial region, limited to breakdown of skin [facility])     Problem List Patient Active Problem List   Diagnosis Date Noted   Advanced nonexudative age-related macular degeneration of right eye without subfoveal involvement 11/15/2020   Advanced nonexudative age-related macular degeneration of left eye without subfoveal involvement 11/15/2020   Diabetes mellitus without complication ([place]) 38/18/2993   Posterior vitreous detachment of both eyes 11/15/2020   Gait disturbance, post-stroke 11/02/2017   Aphasia as late effect of cerebrovascular accident 11/02/2017   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   Aphasia    PAF (paroxysmal atrial  fibrillation) ([place])    Diabetes mellitus type 2 in obese ([place])    Benign essential HTN    Hypothyroidism    Hyperlipidemia    Stroke (cerebrum) ([place]) 10/02/2017   Afib ([place]) 01/10/2017   SOB (shortness of breath) 71/69/6789   Acute diastolic CHF (congestive heart failure) ([place]) 01/10/2017   Atrial fibrillation with RVR ([place]) 01/10/2017   Claudication ([place]) 07/25/2016   Paroxysmal atrial fibrillation ([place]) 07/25/2016   Odynophagia 04/10/2014   Allergic rhinitis 03/23/2014   Chronic rhinitis 12/22/2013   Intrinsic asthma 07/15/2013   Constipation 08/22/2011   Obesity 09/20/2009   G E R D 11/29/2007   Celiac disease 11/29/2007    3:45 PM, 08/20/21 Mearl Latin PT, DPT Physical Therapist at [place] [address] [place], [place], [address] Phone: [phone]   Fax:  [phone]  Name: [name] MRN: [id number] Date of Birth: 1929/02/28

## 2021-08-23 ENCOUNTER — Telehealth (HOSPITAL_COMMUNITY): Payer: Self-pay | Admitting: Physical Therapy

## 2021-08-23 ENCOUNTER — Other Ambulatory Visit: Payer: Self-pay

## 2021-08-23 ENCOUNTER — Ambulatory Visit (HOSPITAL_COMMUNITY): Payer: PPO | Admitting: Physical Therapy

## 2021-08-23 DIAGNOSIS — L97811 Non-pressure chronic ulcer of other part of right lower leg limited to breakdown of skin: Secondary | ICD-10-CM

## 2021-08-23 DIAGNOSIS — R2689 Other abnormalities of gait and mobility: Secondary | ICD-10-CM

## 2021-08-23 NOTE — Telephone Encounter (Signed)
Called pt to move tx time up Rayetta Humphrey, [place] CLT [phone]

## 2021-08-23 NOTE — Therapy (Signed)
[place], [place], [address] Phone: [phone]   Fax:  [phone]  Wound Care Therapy  Patient Details  Name: [name] MRN: [id number] Date of Birth: 1929/08/19 Referring Provider (PT): [name]   Encounter Date: 08/23/2021 Time 12:10- 12:50    PT End of Session - 08/23/21 1319     Visit Number 45    Number of Visits 39    Date for PT Re-Evaluation 10/01/21    Authorization Type [employer] (no auth req, no visit limit)    Progress Note Due on Visit 93             Past Medical History:  Diagnosis Date   Allergic rhinitis    Aortic stenosis    Asthmatic bronchitis    Celiac disease    Cellulitis    CKD (chronic kidney disease)    Diverticulosis    Fx. left wrist 1987   Gastric polyps    GERD (gastroesophageal reflux disease)    History of pneumonia    Hypertension    Hypothyroidism    Iron deficiency anemia    Melanoma ([place])    Neuropathy    PAF (paroxysmal atrial fibrillation) ([place])    Prostate cancer ([place])    REM sleep behavior disorder    RLS (restless legs syndrome)    Sleep apnea    Spasticity    Stroke ([place]) 09/2017   Tremor    Type 2 diabetes mellitus ([place])     Past Surgical History:  Procedure Laterality Date   APPENDECTOMY  1978   CARPAL TUNNEL RELEASE Left    CATARACT EXTRACTION  03/2011   bilateral    CHOLECYSTECTOMY  2001   COLONOSCOPY W/ BIOPSIES  04/27/2008   diverticulosis, duodenitis   FOOT SURGERY     HERNIA REPAIR  1994   bilateral   KNEE SURGERY Bilateral    x 2   LUNG SURGERY  2001   for infection   PROSTATECTOMY  2002   UPPER GASTROINTESTINAL ENDOSCOPY  04/27/2008   celiac disease, gastric polyps    There were no vitals filed for this visit.               Wound Therapy - 08/23/21 0001     Subjective Pt states that his leg hurts at night but when he lowers his leg his pain goes away. of note pt had an ABI on 02/01/2021  which showed no evidence of arterial blockage.    Patient and Family Stated Goals wound to heal    Date of Onset 09/06/20    Prior Treatments MD and self care    Pain Score 0-No pain    Evaluation and Treatment Procedures Explained to Patient/Family Yes    Evaluation and Treatment Procedures agreed to    Wound Properties Date First Assessed: 04/26/21 Time First Assessed: 6433 Wound Type: Non-pressure wound Location: Leg Location Orientation: Right Wound Description (Comments): superior to original wound Present on Admission: No   Dressing Type Santyl    Dressing Changed Changed    Dressing Status Old drainage    Dressing Change Frequency PRN    Site / Wound Assessment Dusky    % Wound base Red or Granulating 25%    % Wound base Yellow/Fibrinous Exudate 75%    Peri-wound Assessment Erythema (blanchable)    Wound Length (cm) 2 cm    Wound Width (cm) 1.4 cm    Wound Depth (cm) 0.1 cm  Drainage Amount Minimal    Drainage Description Serosanguineous    Treatment Cleansed;Debridement (Selective)    Wound Properties Date First Assessed: 12/04/20 Time First Assessed: 3762 Wound Type: Venous stasis ulcer Location: Leg Location Orientation: Right Present on Admission: Yes   Dressing Type Silver hydrofiber    Dressing Changed Changed    Dressing Status Old drainage    Dressing Change Frequency PRN    Site / Wound Assessment Dusky    % Wound base Red or Granulating 20%    % Wound base Yellow/Fibrinous Exudate 80%    Peri-wound Assessment Edema;Erythema (blanchable)    Wound Length (cm) 1 cm    Wound Width (cm) 1.3 cm    Drainage Amount Scant    Drainage Description No odor    Treatment Cleansed;Debridement (Selective)    Selective Debridement - Location PT wounds increased in size with less granualtion, however therapist is hesitant to put profore lite on as pt states no pain in dependent position with increased pain with elevation.  ABI in April was normal.  Therapist will contact MD re  next step,    Selective Debridement - Tools Used Forceps;Scalpel    Selective Debridement - Tissue Removed slough    Wound Therapy - Clinical Statement Pt wounds increased in size with less granulation; April's ABI was normal.  Will conact MD re next step.    Wound Therapy - Functional Problem List gait and balance    Factors Delaying/Impairing Wound Healing Diabetes Mellitus;Vascular compromise;Altered sensation    Wound Therapy - Frequency 2X / week    Wound Plan contact MD    Dressing  fungal cream to LE followed by lotion for moisturizing; wounds with santyl f/b hydrogel and 2x2. cotton for improved conial shape followed by kerlix and coban.                       PT Short Term Goals - 07/12/21 1627       PT SHORT TERM GOAL #1   Title Patient will have at least 50% granualtion tissue.    Status Partially Met               PT Long Term Goals - 07/12/21 1627       PT LONG TERM GOAL #1   Title Patient's wound will be healed to reduce the risk of infection.    Status On-going      PT LONG TERM GOAL #2   Title Patient will be able to verbalize completion of daily skin checks to reduce the risk of further wound development.    Status On-going                   Plan - 08/23/21 1319     Clinical Impression Statement as above    Personal Factors and Comorbidities Age;Comorbidity 3+;Past/Current Experience;Fitness;Time since onset of injury/illness/exacerbation    Comorbidities DM, CKD, neuorpathy, Hx CVA    Examination-Activity Limitations Locomotion Level;Transfers;Squat;Stand;Dressing    Examination-Participation Restrictions Community Activity    Stability/Clinical Decision Making Stable/Uncomplicated    Rehab Potential Good    PT Frequency 2x / week    PT Duration 6 weeks    PT Treatment/Interventions Gait training;Stair training;Functional mobility training;DME Instruction;Therapeutic activities;Therapeutic exercise;Balance training;Neuromuscular  re-education;Patient/family education;Compression bandaging;Manual lymph drainage;Manual techniques    PT Next Visit Plan Continue with both chemical and mechanical debridement to remove adherent eschar to allow healing to occur    Consulted and Agree with Plan of Care  Patient;Family member/caregiver    Family Member Consulted wife             Patient will benefit from skilled therapeutic intervention in order to improve the following deficits and impairments:  Abnormal gait, Decreased skin integrity, Decreased mobility, Impaired sensation  Visit Diagnosis: Other abnormalities of gait and mobility  Ulcer of right pretibial region, limited to breakdown of skin [facility])     Problem List Patient Active Problem List   Diagnosis Date Noted   Advanced nonexudative age-related macular degeneration of right eye without subfoveal involvement 11/15/2020   Advanced nonexudative age-related macular degeneration of left eye without subfoveal involvement 11/15/2020   Diabetes mellitus without complication ([place]) 22/63/3354   Posterior vitreous detachment of both eyes 11/15/2020   Gait disturbance, post-stroke 11/02/2017   Aphasia as late effect of cerebrovascular accident 11/02/2017   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   Aphasia    PAF (paroxysmal atrial fibrillation) ([place])    Diabetes mellitus type 2 in obese ([place])    Benign essential HTN    Hypothyroidism    Hyperlipidemia    Stroke (cerebrum) ([place]) 10/02/2017   Afib ([place]) 01/10/2017   SOB (shortness of breath) 56/25/6389   Acute diastolic CHF (congestive heart failure) ([place]) 01/10/2017   Atrial fibrillation with RVR ([place]) 01/10/2017   Claudication ([place]) 07/25/2016   Paroxysmal atrial fibrillation ([place]) 07/25/2016   Odynophagia 04/10/2014   Allergic rhinitis 03/23/2014   Chronic rhinitis 12/22/2013   Intrinsic asthma 07/15/2013   Constipation 08/22/2011   Obesity 09/20/2009   G E R D 11/29/2007   Celiac disease  11/29/2007   Rayetta Humphrey, PT CLT [phone]  08/23/2021, 1:20 PM  [place], [place], [address] Phone: [phone]   Fax:  [phone]  Name: [name] MRN: [id number] Date of Birth: [birth year]

## 2021-08-27 ENCOUNTER — Telehealth (HOSPITAL_COMMUNITY): Payer: Self-pay | Admitting: Physical Therapy

## 2021-08-27 ENCOUNTER — Encounter (HOSPITAL_COMMUNITY): Payer: Self-pay | Admitting: Physical Therapy

## 2021-08-27 ENCOUNTER — Other Ambulatory Visit: Payer: Self-pay

## 2021-08-27 ENCOUNTER — Ambulatory Visit (HOSPITAL_COMMUNITY): Payer: PPO | Admitting: Physical Therapy

## 2021-08-27 DIAGNOSIS — R2689 Other abnormalities of gait and mobility: Secondary | ICD-10-CM

## 2021-08-27 DIAGNOSIS — L97811 Non-pressure chronic ulcer of other part of right lower leg limited to breakdown of skin: Secondary | ICD-10-CM

## 2021-08-27 NOTE — Therapy (Signed)
[place], [place], [address] Phone: [phone]   Fax:  [phone]  Wound Care Therapy  Patient Details  Name: [name] MRN: [id number] Date of Birth: 1928/12/05 Referring Provider (PT): [name]   Encounter Date: 08/27/2021   PT End of Session - 08/27/21 1550     Visit Number 26    Number of Visits 52    Date for PT Re-Evaluation 10/01/21    Authorization Type [employer] (no auth req, no visit limit)    Progress Note Due on Visit 93    PT Start Time 7035    PT Stop Time 1525    PT Time Calculation (min) 40 min    Activity Tolerance Patient tolerated treatment well    Behavior During Therapy [facility] for tasks assessed/performed             Past Medical History:  Diagnosis Date   Allergic rhinitis    Aortic stenosis    Asthmatic bronchitis    Celiac disease    Cellulitis    CKD (chronic kidney disease)    Diverticulosis    Fx. left wrist 1987   Gastric polyps    GERD (gastroesophageal reflux disease)    History of pneumonia    Hypertension    Hypothyroidism    Iron deficiency anemia    Melanoma ([place])    Neuropathy    PAF (paroxysmal atrial fibrillation) (HCC)    Prostate cancer ([place])    REM sleep behavior disorder    RLS (restless legs syndrome)    Sleep apnea    Spasticity    Stroke ([place]) 09/2017   Tremor    Type 2 diabetes mellitus ([place])     Past Surgical History:  Procedure Laterality Date   APPENDECTOMY  1978   CARPAL TUNNEL RELEASE Left    CATARACT EXTRACTION  03/2011   bilateral    CHOLECYSTECTOMY  2001   COLONOSCOPY W/ BIOPSIES  04/27/2008   diverticulosis, duodenitis   FOOT SURGERY     HERNIA REPAIR  1994   bilateral   KNEE SURGERY Bilateral    x 2   LUNG SURGERY  2001   for infection   PROSTATECTOMY  2002   UPPER GASTROINTESTINAL ENDOSCOPY  04/27/2008   celiac disease, gastric polyps    There were no vitals filed for this visit.      [facility] PT  Assessment - 08/27/21 0001       Assessment   Medical Diagnosis Chronic venous stasis wound    Referring Provider (PT) [name]                     Wound Therapy - 08/27/21 0001     Subjective STate she has been taking tramadol which has helped with his pain and sleeping at night.    Patient and Family Stated Goals wound to heal    Date of Onset 09/06/20    Prior Treatments MD and self care    Pain Scale Faces    Faces Pain Scale Hurts whole lot    Pain Type Chronic pain    Pain Location Leg    Pain Orientation Right;Lateral    Pain Descriptors / Indicators Burning    Pain Onset With Activity    Patients Stated Pain Goal 0    Pain Intervention(s) Repositioned    Evaluation and Treatment Procedures Explained to Patient/Family Yes    Evaluation and Treatment Procedures agreed  to    Wound Properties Date First Assessed: 04/26/21 Time First Assessed: 9371 Wound Type: Non-pressure wound Location: Leg Location Orientation: Right Wound Description (Comments): superior to original wound Present on Admission: No   Wound Image Images linked: 1    Dressing Type Santyl    Dressing Changed Changed    Dressing Status Old drainage    Dressing Change Frequency PRN    Site / Wound Assessment Dusky    % Wound base Red or Granulating 0%    % Wound base Yellow/Fibrinous Exudate 100%    Peri-wound Assessment Erythema (blanchable);Edema    Wound Length (cm) 2 cm   was 2   Wound Width (cm) 2 cm   was 1.4   Wound Depth (cm) 0.1 cm   was .1   Wound Volume (cm^3) 0.4 cm^3    Wound Surface Area (cm^2) 4 cm^2    Margins Unattached edges (unapproximated)    Drainage Amount Minimal    Drainage Description Serosanguineous    Treatment Cleansed;Debridement (Selective)    Wound Properties Date First Assessed: 12/04/20 Time First Assessed: 6967 Wound Type: Venous stasis ulcer Location: Leg Location Orientation: Right Present on Admission: Yes   Dressing Type Impregnated gauze (bismuth)     Dressing Changed Changed    Dressing Status Old drainage    Dressing Change Frequency PRN    Site / Wound Assessment Purple    % Wound base Black/Eschar 100%    Peri-wound Assessment Edema;Erythema (blanchable)    Wound Length (cm) 5 cm   was 1   Wound Width (cm) 1.9 cm   WAS 1.3   Wound Depth (cm) 0.1 cm   was .1   Wound Volume (cm^3) 0.95 cm^3    Wound Surface Area (cm^2) 9.5 cm^2    Margins Unattached edges (unapproximated)    Drainage Amount Scant    Drainage Description Serosanguineous    Treatment Cleansed;Debridement (Selective)    Selective Debridement - Location devitalized tissue.    Selective Debridement - Tools Used Forceps;Scalpel    Selective Debridement - Tissue Removed slough    Wound Therapy - Clinical Statement Wounds increasing in size and inferior wound with deep purple coloring to it as if a deep pressure wound. Toes cold and pale. No compression put on patient on this date. Continued with santyl on superior wound and xeroform on inferior wound. Wrapped with abd pads for shaping and kerlix followed by netting. Secured kerlix with medipore tape. Will contact MD about antibiotics and repeat ABI secondary to current presentation. Instructed patient and wife in this to follow up with MD as well.    Wound Therapy - Functional Problem List gait and balance    Factors Delaying/Impairing Wound Healing Diabetes Mellitus;Vascular compromise;Altered sensation    Wound Therapy - Frequency 2X / week    Wound Plan f/u with MD    Dressing  lotion for then vaseoling; superior wound with santyl f/b hydrogel and 2x2. inf wound with xerofrom, kerliz and abd then netting #5                       PT Short Term Goals - 07/12/21 1627       PT SHORT TERM GOAL #1   Title Patient will have at least 50% granualtion tissue.    Status Partially Met               PT Long Term Goals - 07/12/21 1627       PT  LONG TERM GOAL #1   Title Patient's wound will be healed to  reduce the risk of infection.    Status On-going      PT LONG TERM GOAL #2   Title Patient will be able to verbalize completion of daily skin checks to reduce the risk of further wound development.    Status On-going                   Plan - 08/27/21 1551     Clinical Impression Statement see above    Personal Factors and Comorbidities Age;Comorbidity 3+;Past/Current Experience;Fitness;Time since onset of injury/illness/exacerbation    Comorbidities DM, CKD, neuorpathy, Hx CVA    Examination-Activity Limitations Locomotion Level;Transfers;Squat;Stand;Dressing    Examination-Participation Restrictions Community Activity    Stability/Clinical Decision Making Stable/Uncomplicated    Rehab Potential Good    PT Frequency 2x / week    PT Duration 6 weeks    PT Treatment/Interventions Gait training;Stair training;Functional mobility training;DME Instruction;Therapeutic activities;Therapeutic exercise;Balance training;Neuromuscular re-education;Patient/family education;Compression bandaging;Manual lymph drainage;Manual techniques    PT Next Visit Plan Continue with both chemical and mechanical debridement to remove adherent eschar to allow healing to occur    Consulted and Agree with Plan of Care Patient;Family member/caregiver    Family Member Consulted wife             Patient will benefit from skilled therapeutic intervention in order to improve the following deficits and impairments:  Abnormal gait, Decreased skin integrity, Decreased mobility, Impaired sensation  Visit Diagnosis: Other abnormalities of gait and mobility  Ulcer of right pretibial region, limited to breakdown of skin [facility])     Problem List Patient Active Problem List   Diagnosis Date Noted   Advanced nonexudative age-related macular degeneration of right eye without subfoveal involvement 11/15/2020   Advanced nonexudative age-related macular degeneration of left eye without subfoveal involvement  11/15/2020   Diabetes mellitus without complication ([place]) 50/12/7046   Posterior vitreous detachment of both eyes 11/15/2020   Gait disturbance, post-stroke 11/02/2017   Aphasia as late effect of cerebrovascular accident 11/02/2017   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   Aphasia    PAF (paroxysmal atrial fibrillation) ([place])    Diabetes mellitus type 2 in obese ([place])    Benign essential HTN    Hypothyroidism    Hyperlipidemia    Stroke (cerebrum) ([place]) 10/02/2017   Afib ([place]) 01/10/2017   SOB (shortness of breath) 88/91/6945   Acute diastolic CHF (congestive heart failure) ([place]) 01/10/2017   Atrial fibrillation with RVR ([place]) 01/10/2017   Claudication ([place]) 07/25/2016   Paroxysmal atrial fibrillation ([place]) 07/25/2016   Odynophagia 04/10/2014   Allergic rhinitis 03/23/2014   Chronic rhinitis 12/22/2013   Intrinsic asthma 07/15/2013   Constipation 08/22/2011   Obesity 09/20/2009   G E R D 11/29/2007   Celiac disease 11/29/2007    4:09 PM, 08/27/21 Jerene Pitch, DPT Physical Therapy with [facility]  [phone] office   [place] [address] [place], [place], [address] Phone: [phone]   Fax:  [phone]  Name: [name] MRN: [id number] Date of Birth: 24-Nov-1928

## 2021-08-27 NOTE — Telephone Encounter (Signed)
Called MD's office about recent increase in wound size, pain and change in wound presentation. Left message about potential need for antibiotics and repeat ABI secondary to presentation of likely arterial issues to LE.  5:11 PM, 08/27/21 Jerene Pitch, DPT Physical Therapy with [facility]  [phone] office

## 2021-08-28 ENCOUNTER — Telehealth (HOSPITAL_COMMUNITY): Payer: Self-pay | Admitting: Physical Therapy

## 2021-08-28 NOTE — Telephone Encounter (Signed)
S.w Zikia @ Dr. Juel Burrow office she states that the Antibiotics are sent to the pharmacy and the patient knows  to pick them up. Dr. [place] Crane also states that pateint dosen't want any intervention no matter what the ABI says.

## 2021-08-30 ENCOUNTER — Encounter (HOSPITAL_COMMUNITY): Payer: Self-pay | Admitting: Physical Therapy

## 2021-08-30 ENCOUNTER — Ambulatory Visit (HOSPITAL_COMMUNITY): Payer: PPO | Admitting: Physical Therapy

## 2021-08-30 ENCOUNTER — Other Ambulatory Visit: Payer: Self-pay

## 2021-08-30 DIAGNOSIS — R2689 Other abnormalities of gait and mobility: Secondary | ICD-10-CM

## 2021-08-30 DIAGNOSIS — L97811 Non-pressure chronic ulcer of other part of right lower leg limited to breakdown of skin: Secondary | ICD-10-CM

## 2021-08-30 NOTE — Therapy (Signed)
[place], [place], [address] Phone: [phone]   Fax:  [phone]  Wound Care Therapy  Patient Details  Name: [name] MRN: [id number] Date of Birth: Nov 13, 1928 Referring Provider (PT): [name]   Encounter Date: 08/30/2021   PT End of Session - 08/30/21 1551     Visit Number 87    Number of Visits 63    Date for PT Re-Evaluation 10/01/21    Authorization Type [employer] (no auth req, no visit limit)    Progress Note Due on Visit 93    PT Start Time 1445    PT Stop Time 1525    PT Time Calculation (min) 40 min    Activity Tolerance Patient tolerated treatment well    Behavior During Therapy [facility] for tasks assessed/performed             Past Medical History:  Diagnosis Date   Allergic rhinitis    Aortic stenosis    Asthmatic bronchitis    Celiac disease    Cellulitis    CKD (chronic kidney disease)    Diverticulosis    Fx. left wrist 1987   Gastric polyps    GERD (gastroesophageal reflux disease)    History of pneumonia    Hypertension    Hypothyroidism    Iron deficiency anemia    Melanoma ([place])    Neuropathy    PAF (paroxysmal atrial fibrillation) ([place])    Prostate cancer ([place])    REM sleep behavior disorder    RLS (restless legs syndrome)    Sleep apnea    Spasticity    Stroke ([place]) 09/2017   Tremor    Type 2 diabetes mellitus ([place])     Past Surgical History:  Procedure Laterality Date   APPENDECTOMY  1978   CARPAL TUNNEL RELEASE Left    CATARACT EXTRACTION  03/2011   bilateral    CHOLECYSTECTOMY  2001   COLONOSCOPY W/ BIOPSIES  04/27/2008   diverticulosis, duodenitis   FOOT SURGERY     HERNIA REPAIR  1994   bilateral   KNEE SURGERY Bilateral    x 2   LUNG SURGERY  2001   for infection   PROSTATECTOMY  2002   UPPER GASTROINTESTINAL ENDOSCOPY  04/27/2008   celiac disease, gastric polyps    There were no vitals filed for this visit.      [facility] PT  Assessment - 08/30/21 0001       Assessment   Medical Diagnosis Chronic venous stasis wound    Referring Provider (PT) [name]                     Wound Therapy - 08/30/21 0001     Subjective States he started his antibiotics. Reports he is on 2xday for a weeka dn then one a day until his wounds heal. States he has not been having as much pain as night with new dressing. Slide down a little bit but not much.    Patient and Family Stated Goals wound to heal    Date of Onset 09/06/20    Prior Treatments MD and self care    Pain Scale Faces    Faces Pain Scale Hurts whole lot    Evaluation and Treatment Procedures Explained to Patient/Family Yes    Evaluation and Treatment Procedures agreed to    Wound Properties Date First Assessed: 04/26/21 Time First Assessed: 1941 Wound Type: Non-pressure wound Location: Leg  Location Orientation: Right Wound Description (Comments): superior to original wound Present on Admission: No   Dressing Type Santyl    Dressing Changed Changed    Dressing Status Old drainage    Dressing Change Frequency PRN    Site / Wound Assessment Dusky    % Wound base Red or Granulating 0%    % Wound base Yellow/Fibrinous Exudate 100%    Peri-wound Assessment Erythema (blanchable);Edema    Margins Unattached edges (unapproximated)    Drainage Amount Minimal    Drainage Description Serosanguineous    Treatment Cleansed;Debridement (Selective)    Wound Properties Date First Assessed: 12/04/20 Time First Assessed: 9518 Wound Type: Venous stasis ulcer Location: Leg Location Orientation: Right Present on Admission: Yes   Dressing Type Impregnated gauze (bismuth)    Dressing Changed Changed    Dressing Status Old drainage    Dressing Change Frequency PRN    Site / Wound Assessment Brown;Granulation tissue;Yellow;Pale;Pink    % Wound base Red or Granulating 20%    % Wound base Yellow/Fibrinous Exudate 45%    % Wound base Black/Eschar 35%    Peri-wound  Assessment Edema;Erythema (blanchable)    Margins Unattached edges (unapproximated)    Drainage Amount Moderate    Drainage Description Serosanguineous;Purulent;No odor    Treatment Debridement (Selective);Cleansed    Selective Debridement - Location devitalized tissue.    Selective Debridement - Tools Used Forceps;Scalpel    Selective Debridement - Tissue Removed slough    Wound Therapy - Clinical Statement Inferior wound without purple coloring throughout. Able to debride good bit along inferir wound but superior wound continues to be painful with debridemnet. Good bit of drainage from inferior wound noted. Increased edmea throughout but with recent purple coloring of woudn with increase in size NO compression was applied to leg. Retro massage to leg to help with edema, tolerated mildly well. Continued with kerlix and abd pads to shape, followed by netting.    Wound Therapy - Functional Problem List gait and balance    Factors Delaying/Impairing Wound Healing Diabetes Mellitus;Vascular compromise;Altered sensation    Wound Therapy - Frequency 2X / week    Wound Plan f/u with MD    Dressing  lotion then vasoline; superior wound with santyl f/b hydrogel and 2x2. inf wound with xerofrom on granulated part and medihoney on guaze to devitalized tissue, kerliz and abd then netting #5                       PT Short Term Goals - 07/12/21 1627       PT SHORT TERM GOAL #1   Title Patient will have at least 50% granualtion tissue.    Status Partially Met               PT Long Term Goals - 07/12/21 1627       PT LONG TERM GOAL #1   Title Patient's wound will be healed to reduce the risk of infection.    Status On-going      PT LONG TERM GOAL #2   Title Patient will be able to verbalize completion of daily skin checks to reduce the risk of further wound development.    Status On-going                    Patient will benefit from skilled therapeutic  intervention in order to improve the following deficits and impairments:     Visit Diagnosis: Other abnormalities of gait and mobility  Ulcer of right pretibial region, limited to breakdown of skin [facility])     Problem List Patient Active Problem List   Diagnosis Date Noted   Advanced nonexudative age-related macular degeneration of right eye without subfoveal involvement 11/15/2020   Advanced nonexudative age-related macular degeneration of left eye without subfoveal involvement 11/15/2020   Diabetes mellitus without complication ([place]) 73/71/0626   Posterior vitreous detachment of both eyes 11/15/2020   Gait disturbance, post-stroke 11/02/2017   Aphasia as late effect of cerebrovascular accident 11/02/2017   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   Aphasia    PAF (paroxysmal atrial fibrillation) ([place])    Diabetes mellitus type 2 in obese ([place])    Benign essential HTN    Hypothyroidism    Hyperlipidemia    Stroke (cerebrum) ([place]) 10/02/2017   Afib ([place]) 01/10/2017   SOB (shortness of breath) 94/85/4627   Acute diastolic CHF (congestive heart failure) ([place]) 01/10/2017   Atrial fibrillation with RVR ([place]) 01/10/2017   Claudication ([place]) 07/25/2016   Paroxysmal atrial fibrillation ([place]) 07/25/2016   Odynophagia 04/10/2014   Allergic rhinitis 03/23/2014   Chronic rhinitis 12/22/2013   Intrinsic asthma 07/15/2013   Constipation 08/22/2011   Obesity 09/20/2009   G E R D 11/29/2007   Celiac disease 11/29/2007    3:52 PM, 08/30/21 Jerene Pitch, DPT Physical Therapy with [facility]  [phone] office   [place] [address] [place], [place], [address] Phone: [phone]   Fax:  [phone]  Name: [name] MRN: [id number] Date of Birth: Feb 17, 1929

## 2021-09-02 ENCOUNTER — Other Ambulatory Visit: Payer: Self-pay

## 2021-09-02 ENCOUNTER — Ambulatory Visit (HOSPITAL_COMMUNITY): Payer: PPO | Admitting: Physical Therapy

## 2021-09-02 DIAGNOSIS — L97811 Non-pressure chronic ulcer of other part of right lower leg limited to breakdown of skin: Secondary | ICD-10-CM

## 2021-09-02 DIAGNOSIS — R2689 Other abnormalities of gait and mobility: Secondary | ICD-10-CM | POA: Diagnosis not present

## 2021-09-02 NOTE — Therapy (Signed)
[place], [place], [address] Phone: [phone]   Fax:  [phone]  Wound Care Therapy  Patient Details  Name: [name] MRN: [id number] Date of Birth: 05-Sep-1929 Referring Provider (PT): [name]   Encounter Date: 09/02/2021   PT End of Session - 09/02/21 1707     Visit Number 13    Number of Visits 75    Date for PT Re-Evaluation 10/01/21    Authorization Type [employer] (no auth req, no visit limit)    Progress Note Due on Visit 93    PT Start Time 1445    PT Stop Time 1525    PT Time Calculation (min) 40 min    Activity Tolerance Patient tolerated treatment well    Behavior During Therapy [facility] for tasks assessed/performed             Past Medical History:  Diagnosis Date   Allergic rhinitis    Aortic stenosis    Asthmatic bronchitis    Celiac disease    Cellulitis    CKD (chronic kidney disease)    Diverticulosis    Fx. left wrist 1987   Gastric polyps    GERD (gastroesophageal reflux disease)    History of pneumonia    Hypertension    Hypothyroidism    Iron deficiency anemia    Melanoma ([place])    Neuropathy    PAF (paroxysmal atrial fibrillation) ([place])    Prostate cancer ([place])    REM sleep behavior disorder    RLS (restless legs syndrome)    Sleep apnea    Spasticity    Stroke ([place]) 09/2017   Tremor    Type 2 diabetes mellitus ([place])     Past Surgical History:  Procedure Laterality Date   APPENDECTOMY  1978   CARPAL TUNNEL RELEASE Left    CATARACT EXTRACTION  03/2011   bilateral    CHOLECYSTECTOMY  2001   COLONOSCOPY W/ BIOPSIES  04/27/2008   diverticulosis, duodenitis   FOOT SURGERY     HERNIA REPAIR  1994   bilateral   KNEE SURGERY Bilateral    x 2   LUNG SURGERY  2001   for infection   PROSTATECTOMY  2002   UPPER GASTROINTESTINAL ENDOSCOPY  04/27/2008   celiac disease, gastric polyps    There were no vitals filed for this  visit.               Wound Therapy - 09/02/21 0001     Subjective Pt again states that he has increased pain with leg elevated.  PT stated that she spoke to MD nurse who stated that even if the ABI came back needing a stent pt stated that he would not go thru with it.  PT denies this and has an appointment tomorrow with MD therefore therapist told both  the pt and his wife to ask about an ABI.  LE    Patient and Family Stated Goals wound to heal    Date of Onset 09/06/20    Prior Treatments MD and self care    Pain Score 5     Pain Type Chronic pain    Pain Location Leg    Pain Orientation Lower    Pain Descriptors / Indicators Aching    Pain Onset Other (Comment)   with elevation   Patients Stated Pain Goal 0    Pain Intervention(s) Emotional support    Evaluation and Treatment Procedures Explained to  Patient/Family Yes    Evaluation and Treatment Procedures agreed to    Wound Properties Date First Assessed: 04/26/21 Time First Assessed: 0175 Wound Type: Non-pressure wound Location: Leg Location Orientation: Right Wound Description (Comments): superior to original wound Present on Admission: No   Wound Image Images linked: 1    Dressing Type Santyl    Dressing Changed Changed    Dressing Status Old drainage    Dressing Change Frequency PRN    Site / Wound Assessment Yellow    % Wound base Red or Granulating 0%    % Wound base Yellow/Fibrinous Exudate 100%    Peri-wound Assessment Edema;Erythema (blanchable)    Wound Length (cm) 2.3 cm    Wound Width (cm) 1.8 cm    Wound Depth (cm) 0.3 cm    Wound Volume (cm^3) 1.24 cm^3    Wound Surface Area (cm^2) 4.14 cm^2    Margins Unattached edges (unapproximated)    Drainage Amount Minimal    Drainage Description Serous    Treatment Cleansed;Debridement (Selective)    Wound Properties Date First Assessed: 12/04/20 Time First Assessed: 1545 Wound Type: Venous stasis ulcer Location: Leg Location Orientation: Right Present on  Admission: Yes   Dressing Type Impregnated gauze (bismuth);Santyl    Dressing Changed Changed    Dressing Status Old drainage    Dressing Change Frequency PRN    % Wound base Red or Granulating 15%    Peri-wound Assessment Edema;Erythema (blanchable)    Wound Length (cm) 6.5 cm    Wound Width (cm) 2 cm    Wound Depth (cm) 0.4 cm    Wound Volume (cm^3) 5.2 cm^3    Wound Surface Area (cm^2) 13 cm^2    Drainage Amount Moderate    Drainage Description Serosanguineous    Treatment Debridement (Selective)    Selective Debridement - Location both wound beds    Selective Debridement - Tools Used Forceps;Scalpel    Selective Debridement - Tissue Removed slough    Wound Therapy - Clinical Statement Both wounds have increased in size with increased edema and redness, LE is now weeping therefore therapist is returning to profore lite and a trial of foam above the wounds following application of santyl and 4x4 but prior to profore.    Wound Therapy - Functional Problem List gait and balance    Factors Delaying/Impairing Wound Healing Diabetes Mellitus;Vascular compromise;Altered sensation    Wound Therapy - Frequency 2X / week    Wound Plan See if MD agreed to ABI assess how compression does for wound s    Dressing  cleanse followed by moisturizing, santyl to wound beds f/b 4x4, 1/2" foam and profore lite dressing.                       PT Short Term Goals - 07/12/21 1627       PT SHORT TERM GOAL #1   Title Patient will have at least 50% granualtion tissue.    Status Partially Met               PT Long Term Goals - 07/12/21 1627       PT LONG TERM GOAL #1   Title Patient's wound will be healed to reduce the risk of infection.    Status On-going      PT LONG TERM GOAL #2   Title Patient will be able to verbalize completion of daily skin checks to reduce the risk of further wound development.    Status  On-going                   Plan - 09/02/21 1708      Clinical Impression Statement see above    Personal Factors and Comorbidities Age;Comorbidity 3+;Past/Current Experience;Fitness;Time since onset of injury/illness/exacerbation    Comorbidities DM, CKD, neuorpathy, Hx CVA    Examination-Activity Limitations Locomotion Level;Transfers;Squat;Stand;Dressing    Examination-Participation Restrictions Community Activity    Stability/Clinical Decision Making Stable/Uncomplicated    Rehab Potential Good    PT Frequency 2x / week    PT Duration 6 weeks    PT Treatment/Interventions Gait training;Stair training;Functional mobility training;DME Instruction;Therapeutic activities;Therapeutic exercise;Balance training;Neuromuscular re-education;Patient/family education;Compression bandaging;Manual lymph drainage;Manual techniques    PT Next Visit Plan Continue with both chemical and mechanical debridement to remove adherent eschar to allow healing to occur    Consulted and Agree with Plan of Care Patient;Family member/caregiver    Family Member Consulted wife             Patient will benefit from skilled therapeutic intervention in order to improve the following deficits and impairments:  Abnormal gait, Decreased skin integrity, Decreased mobility, Impaired sensation  Visit Diagnosis: Other abnormalities of gait and mobility  Ulcer of right pretibial region, limited to breakdown of skin [facility])     Problem List Patient Active Problem List   Diagnosis Date Noted   Advanced nonexudative age-related macular degeneration of right eye without subfoveal involvement 11/15/2020   Advanced nonexudative age-related macular degeneration of left eye without subfoveal involvement 11/15/2020   Diabetes mellitus without complication ([place]) 03/70/4888   Posterior vitreous detachment of both eyes 11/15/2020   Gait disturbance, post-stroke 11/02/2017   Aphasia as late effect of cerebrovascular accident 11/02/2017   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   Aphasia    PAF (paroxysmal atrial fibrillation) ([place])    Diabetes mellitus type 2 in obese ([place])    Benign essential HTN    Hypothyroidism    Hyperlipidemia    Stroke (cerebrum) ([place]) 10/02/2017   Afib ([place]) 01/10/2017   SOB (shortness of breath) 91/69/4503   Acute diastolic CHF (congestive heart failure) ([place]) 01/10/2017   Atrial fibrillation with RVR ([place]) 01/10/2017   Claudication ([place]) 07/25/2016   Paroxysmal atrial fibrillation ([place]) 07/25/2016   Odynophagia 04/10/2014   Allergic rhinitis 03/23/2014   Chronic rhinitis 12/22/2013   Intrinsic asthma 07/15/2013   Constipation 08/22/2011   Obesity 09/20/2009   G E R D 11/29/2007   Celiac disease 11/29/2007   Rayetta Humphrey, PT CLT [phone]  09/02/2021, 5:08 PM  [place] [address] [place], [place], [address] Phone: [phone]   Fax:  [phone]  Name: [name] MRN: [id number] Date of Birth: 1929-02-28

## 2021-09-03 DIAGNOSIS — L8991 Pressure ulcer of unspecified site, stage 1: Secondary | ICD-10-CM | POA: Diagnosis not present

## 2021-09-03 DIAGNOSIS — I87399 Chronic venous hypertension (idiopathic) with other complications of unspecified lower extremity: Secondary | ICD-10-CM | POA: Diagnosis not present

## 2021-09-04 ENCOUNTER — Encounter (HOSPITAL_COMMUNITY): Payer: Self-pay | Admitting: Physical Therapy

## 2021-09-04 ENCOUNTER — Ambulatory Visit (HOSPITAL_COMMUNITY): Payer: PPO | Admitting: Physical Therapy

## 2021-09-04 ENCOUNTER — Other Ambulatory Visit: Payer: Self-pay

## 2021-09-04 DIAGNOSIS — L97811 Non-pressure chronic ulcer of other part of right lower leg limited to breakdown of skin: Secondary | ICD-10-CM

## 2021-09-04 DIAGNOSIS — R2689 Other abnormalities of gait and mobility: Secondary | ICD-10-CM | POA: Diagnosis not present

## 2021-09-04 NOTE — Therapy (Signed)
[place], [place], [address] Phone: [phone]   Fax:  [phone]  Wound Care Therapy  Patient Details  Name: [name] MRN: [id number] Date of Birth: [birth year] Referring Provider (PT): [name]   Encounter Date: 09/04/2021   PT End of Session - 09/04/21 1607     Visit Number 39    Number of Visits 35    Date for PT Re-Evaluation 10/01/21    Authorization Type [employer] (no auth req, no visit limit)    Progress Note Due on Visit 93    PT Start Time 9509    PT Stop Time 1530    PT Time Calculation (min) 45 min    Activity Tolerance Patient tolerated treatment well    Behavior During Therapy [facility] for tasks assessed/performed             Past Medical History:  Diagnosis Date   Allergic rhinitis    Aortic stenosis    Asthmatic bronchitis    Celiac disease    Cellulitis    CKD (chronic kidney disease)    Diverticulosis    Fx. left wrist 1987   Gastric polyps    GERD (gastroesophageal reflux disease)    History of pneumonia    Hypertension    Hypothyroidism    Iron deficiency anemia    Melanoma ([place])    Neuropathy    PAF (paroxysmal atrial fibrillation) ([facility])    Prostate cancer ([place])    REM sleep behavior disorder    RLS (restless legs syndrome)    Sleep apnea    Spasticity    Stroke ([place]) 09/2017   Tremor    Type 2 diabetes mellitus ([place])     Past Surgical History:  Procedure Laterality Date   APPENDECTOMY  1978   CARPAL TUNNEL RELEASE Left    CATARACT EXTRACTION  03/2011   bilateral    CHOLECYSTECTOMY  2001   COLONOSCOPY W/ BIOPSIES  04/27/2008   diverticulosis, duodenitis   FOOT SURGERY     HERNIA REPAIR  1994   bilateral   KNEE SURGERY Bilateral    x 2   LUNG SURGERY  2001   for infection   PROSTATECTOMY  2002   UPPER GASTROINTESTINAL ENDOSCOPY  04/27/2008   celiac disease, gastric polyps    There were no vitals filed for this  visit.               Wound Therapy - 09/04/21 0001     Subjective Pt wife states that the MD stated that he was going to send an order for [place] to treat his pressure wound on his buttock.    Patient and Family Stated Goals wound to heal    Date of Onset 09/06/20    Prior Treatments MD and self care    Pain Score 3     Pain Type Chronic pain    Pain Location Leg    Pain Orientation Lower    Pain Descriptors / Indicators Aching    Pain Onset Other (Comment)    Pain Intervention(s) Emotional support    Evaluation and Treatment Procedures Explained to Patient/Family Yes    Evaluation and Treatment Procedures agreed to    Wound Properties Date First Assessed: 04/26/21 Time First Assessed: 3267 Wound Type: Non-pressure wound Location: Leg Location Orientation: Right Wound Description (Comments): superior to original wound Present on Admission: No   Dressing Type Compression wrap;Santyl    Dressing Changed Changed  Dressing Status Old drainage    Dressing Change Frequency PRN    Site / Wound Assessment Yellow    % Wound base Red or Granulating 10%    % Wound base Yellow/Fibrinous Exudate 90%    Peri-wound Assessment Erythema (blanchable);Edema    Margins Attached edges (approximated)    Drainage Amount Minimal    Drainage Description Serous    Treatment Cleansed;Debridement (Selective)    Wound Properties Date First Assessed: 12/04/20 Time First Assessed: 7616 Wound Type: Venous stasis ulcer Location: Leg Location Orientation: Right Present on Admission: Yes   Dressing Type Santyl;Compression wrap    Dressing Changed Changed    Dressing Status Old drainage    Dressing Change Frequency PRN    Site / Wound Assessment Pink;Yellow    % Wound base Red or Granulating 30%    % Wound base Yellow/Fibrinous Exudate 70%    Peri-wound Assessment Edema;Erythema (blanchable)    Drainage Amount Moderate    Drainage Description Green    Treatment Debridement (Selective)    Selective  Debridement - Tools Used Forceps;Scalpel    Selective Debridement - Tissue Removed slough    Wound Therapy - Clinical Statement Noted green drainage from inferior wound.  Pt is still on antibiotic.  Noted improvement of granulation of inferior wound as well.  Therpist observed pt pressure wound on his buttock at the request of pt wife.  Wound is 50% granulated; pt wife is using foam only suggested to use medihoney on wound prior to covering with foam    Wound Therapy - Functional Problem List gait and balance    Factors Delaying/Impairing Wound Healing Diabetes Mellitus;Vascular compromise;Altered sensation    Wound Therapy - Frequency 2X / week    Wound Plan COntinue to treat RT LE wound monitor buttock pressure wound to give suggestions for wife to treat at home.    Dressing  cleanse followed by moisturizing, santyl to wound beds f/b 4x4, 1/2" foam and profore lite dressing.                       PT Short Term Goals - 07/12/21 1627       PT SHORT TERM GOAL #1   Title Patient will have at least 50% granualtion tissue.    Status Partially Met               PT Long Term Goals - 07/12/21 1627       PT LONG TERM GOAL #1   Title Patient's wound will be healed to reduce the risk of infection.    Status On-going      PT LONG TERM GOAL #2   Title Patient will be able to verbalize completion of daily skin checks to reduce the risk of further wound development.    Status On-going                   Plan - 09/04/21 1608     Clinical Impression Statement see above    Personal Factors and Comorbidities Age;Comorbidity 3+;Past/Current Experience;Fitness;Time since onset of injury/illness/exacerbation    Comorbidities DM, CKD, neuorpathy, Hx CVA    Examination-Activity Limitations Locomotion Level;Transfers;Squat;Stand;Dressing    Examination-Participation Restrictions Community Activity    Stability/Clinical Decision Making Stable/Uncomplicated    Rehab Potential  Good    PT Frequency 2x / week    PT Duration 6 weeks    PT Treatment/Interventions Gait training;Stair training;Functional mobility training;DME Instruction;Therapeutic activities;Therapeutic exercise;Balance training;Neuromuscular re-education;Patient/family education;Compression bandaging;Manual lymph drainage;Manual  techniques    PT Next Visit Plan Continue with both chemical and mechanical debridement to remove adherent eschar to allow healing to occur    Consulted and Agree with Plan of Care Patient;Family member/caregiver    Family Member Consulted wife             Patient will benefit from skilled therapeutic intervention in order to improve the following deficits and impairments:  Abnormal gait, Decreased skin integrity, Decreased mobility, Impaired sensation  Visit Diagnosis: Other abnormalities of gait and mobility  Ulcer of right pretibial region, limited to breakdown of skin [facility])     Problem List Patient Active Problem List   Diagnosis Date Noted   Advanced nonexudative age-related macular degeneration of right eye without subfoveal involvement 11/15/2020   Advanced nonexudative age-related macular degeneration of left eye without subfoveal involvement 11/15/2020   Diabetes mellitus without complication ([place]) 37/49/6646   Posterior vitreous detachment of both eyes 11/15/2020   Gait disturbance, post-stroke 11/02/2017   Aphasia as late effect of cerebrovascular accident 11/02/2017   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   Aphasia    PAF (paroxysmal atrial fibrillation) ([place])    Diabetes mellitus type 2 in obese ([place])    Benign essential HTN    Hypothyroidism    Hyperlipidemia    Stroke (cerebrum) ([place]) 10/02/2017   Afib ([place]) 01/10/2017   SOB (shortness of breath) 60/56/3729   Acute diastolic CHF (congestive heart failure) ([place]) 01/10/2017   Atrial fibrillation with RVR ([place]) 01/10/2017   Claudication ([place]) 07/25/2016   Paroxysmal atrial  fibrillation ([place]) 07/25/2016   Odynophagia 04/10/2014   Allergic rhinitis 03/23/2014   Chronic rhinitis 12/22/2013   Intrinsic asthma 07/15/2013   Constipation 08/22/2011   Obesity 09/20/2009   G E R D 11/29/2007   Celiac disease 11/29/2007   Rayetta Humphrey, PT CLT [phone]  09/04/2021, 4:09 PM  [place], [place], [address] Phone: [phone]   Fax:  [phone]  Name: [name] MRN: [id number] Date of Birth: 04/09/1929

## 2021-09-09 ENCOUNTER — Encounter (INDEPENDENT_AMBULATORY_CARE_PROVIDER_SITE_OTHER): Payer: PPO | Admitting: Ophthalmology

## 2021-09-09 ENCOUNTER — Telehealth (HOSPITAL_COMMUNITY): Payer: Self-pay | Admitting: Physical Therapy

## 2021-09-09 DIAGNOSIS — E1129 Type 2 diabetes mellitus with other diabetic kidney complication: Secondary | ICD-10-CM | POA: Diagnosis not present

## 2021-09-09 DIAGNOSIS — R600 Localized edema: Secondary | ICD-10-CM | POA: Diagnosis not present

## 2021-09-09 DIAGNOSIS — D638 Anemia in other chronic diseases classified elsewhere: Secondary | ICD-10-CM | POA: Diagnosis not present

## 2021-09-09 DIAGNOSIS — I10 Essential (primary) hypertension: Secondary | ICD-10-CM | POA: Diagnosis not present

## 2021-09-09 DIAGNOSIS — R809 Proteinuria, unspecified: Secondary | ICD-10-CM | POA: Diagnosis not present

## 2021-09-09 DIAGNOSIS — E1122 Type 2 diabetes mellitus with diabetic chronic kidney disease: Secondary | ICD-10-CM | POA: Diagnosis not present

## 2021-09-09 DIAGNOSIS — N189 Chronic kidney disease, unspecified: Secondary | ICD-10-CM | POA: Diagnosis not present

## 2021-09-09 DIAGNOSIS — E211 Secondary hyperparathyroidism, not elsewhere classified: Secondary | ICD-10-CM | POA: Diagnosis not present

## 2021-09-09 NOTE — Telephone Encounter (Signed)
Please have Cindy s/w mom tomorrow at [facility] appointment - the family is concerned about his incoherent conversations after or during the use of his boot equipment.

## 2021-09-10 ENCOUNTER — Other Ambulatory Visit: Payer: Self-pay

## 2021-09-10 ENCOUNTER — Ambulatory Visit (HOSPITAL_COMMUNITY): Payer: PPO | Admitting: Physical Therapy

## 2021-09-10 ENCOUNTER — Telehealth (HOSPITAL_COMMUNITY): Payer: Self-pay | Admitting: Physical Therapy

## 2021-09-10 DIAGNOSIS — R2689 Other abnormalities of gait and mobility: Secondary | ICD-10-CM

## 2021-09-10 DIAGNOSIS — L97811 Non-pressure chronic ulcer of other part of right lower leg limited to breakdown of skin: Secondary | ICD-10-CM

## 2021-09-10 NOTE — Telephone Encounter (Signed)
Called pt with no answer, asked to bring a pair of tennis shoes to appointment so that we can see if they will fit over the bandages.  This will allow pt to have a normalize gait.  Rayetta Humphrey, [place] CLT [phone]

## 2021-09-10 NOTE — Therapy (Signed)
[place], [place], [address] Phone: [phone]   Fax:  [phone]  Wound Care Therapy  Patient Details  Name: [name] MRN: [id number] Date of Birth: [birth year] Referring Provider (PT): [name]   Encounter Date: 09/10/2021   PT End of Session - 09/10/21 1550     Visit Number 90    Number of Visits 97    Date for PT Re-Evaluation 10/01/21    Authorization Type [employer] (no auth req, no visit limit)    Progress Note Due on Visit 93    PT Start Time 1450    PT Stop Time 1530    PT Time Calculation (min) 40 min    Activity Tolerance Patient tolerated treatment well    Behavior During Therapy [facility] for tasks assessed/performed             Past Medical History:  Diagnosis Date   Allergic rhinitis    Aortic stenosis    Asthmatic bronchitis    Celiac disease    Cellulitis    CKD (chronic kidney disease)    Diverticulosis    Fx. left wrist 1987   Gastric polyps    GERD (gastroesophageal reflux disease)    History of pneumonia    Hypertension    Hypothyroidism    Iron deficiency anemia    Melanoma ([place])    Neuropathy    PAF (paroxysmal atrial fibrillation) (HCC)    Prostate cancer ([place])    REM sleep behavior disorder    RLS (restless legs syndrome)    Sleep apnea    Spasticity    Stroke ([place]) 09/2017   Tremor    Type 2 diabetes mellitus ([place])     Past Surgical History:  Procedure Laterality Date   APPENDECTOMY  1978   CARPAL TUNNEL RELEASE Left    CATARACT EXTRACTION  03/2011   bilateral    CHOLECYSTECTOMY  2001   COLONOSCOPY W/ BIOPSIES  04/27/2008   diverticulosis, duodenitis   FOOT SURGERY     HERNIA REPAIR  1994   bilateral   KNEE SURGERY Bilateral    x 2   LUNG SURGERY  2001   for infection   PROSTATECTOMY  2002   UPPER GASTROINTESTINAL ENDOSCOPY  04/27/2008   celiac disease, gastric polyps    There were no vitals filed for this  visit.               Wound Therapy - 09/10/21 0001     Subjective Pt wife states that when pt is on the compression pump he becomes disoriented just for a few moments.  She decided not to use the pump this weekend and there was no confusion.  Therapist explained that she had never heard of such a thing but she could call Erlene Quan the rep from where she obtained the pump and ask him.    Patient and Family Stated Goals wound to heal    Date of Onset 09/06/20    Prior Treatments MD and self care    Pain Scale 0-10    Pain Score 0-No pain   no pain unless we are debriding; when debridement pain increases to an 8/10.   Pain Type Acute pain    Pain Location Leg    Pain Orientation Lower    Pain Descriptors / Indicators Jabbing;Sharp    Pain Intervention(s) Emotional support    Evaluation and Treatment Procedures Explained to Patient/Family Yes    Evaluation  and Treatment Procedures agreed to    Wound Properties Date First Assessed: 04/26/21 Time First Assessed: 2440 Wound Type: Non-pressure wound Location: Leg Location Orientation: Right Wound Description (Comments): superior to original wound Present on Admission: No   Dressing Type Compression wrap;Impregnated gauze (bismuth);Santyl    Dressing Changed Changed    Dressing Status Old drainage    Dressing Change Frequency PRN    Site / Wound Assessment Yellow    % Wound base Red or Granulating 5%    % Wound base Yellow/Fibrinous Exudate 95%    Peri-wound Assessment Erythema (blanchable);Edema    Wound Length (cm) 2.2 cm    Wound Width (cm) 1.8 cm    Wound Surface Area (cm^2) 3.96 cm^2    Drainage Amount Minimal    Drainage Description Serous    Treatment Debridement (Selective);Cleansed    Wound Properties Date First Assessed: 12/04/20 Time First Assessed: 1545 Wound Type: Venous stasis ulcer Location: Leg Location Orientation: Right Present on Admission: Yes   Dressing Type Compression wrap;Impregnated gauze (bismuth)     Dressing Changed Changed    Dressing Status Old drainage    Dressing Change Frequency PRN    Site / Wound Assessment Pink;Yellow    % Wound base Red or Granulating 40%    % Wound base Yellow/Fibrinous Exudate 60%    Wound Length (cm) 5.5 cm    Wound Width (cm) 2 cm    Wound Depth (cm) 0.4 cm    Wound Volume (cm^3) 4.4 cm^3    Wound Surface Area (cm^2) 11 cm^2    Drainage Amount Minimal    Drainage Description Serous;Green   less green   Treatment Debridement (Selective)    Selective Debridement - Location both wounds to patient's tolerance.    Selective Debridement - Tools Used Forceps;Scalpel    Selective Debridement - Tissue Removed slough    Wound Therapy - Clinical Statement Wound has less green drainage but does have a tinge of green.  Wounds on the whole look better, however LE on the wholed has more redness and swelling.    Wound Therapy - Functional Problem List gait and balance    Factors Delaying/Impairing Wound Healing Diabetes Mellitus;Vascular compromise;Altered sensation    Wound Therapy - Frequency 2X / week    Wound Plan Assess LE if it is looking better continue if worse contact MD about the possibility of increasing antibiotic back up to twice a day vs. one time a day which is where pt has been for the past week.    Dressing  cleanse followed by moisturizing, santyl to wound beds f/b 4x4, and profore lite dressing.                       PT Short Term Goals - 07/12/21 1627       PT SHORT TERM GOAL #1   Title Patient will have at least 50% granualtion tissue.    Status Partially Met               PT Long Term Goals - 07/12/21 1627       PT LONG TERM GOAL #1   Title Patient's wound will be healed to reduce the risk of infection.    Status On-going      PT LONG TERM GOAL #2   Title Patient will be able to verbalize completion of daily skin checks to reduce the risk of further wound development.    Status On-going  Plan - 09/10/21 1551     Clinical Impression Statement see above    Personal Factors and Comorbidities Age;Comorbidity 3+;Past/Current Experience;Fitness;Time since onset of injury/illness/exacerbation    Comorbidities DM, CKD, neuorpathy, Hx CVA    Examination-Activity Limitations Locomotion Level;Transfers;Squat;Stand;Dressing    Examination-Participation Restrictions Community Activity    Stability/Clinical Decision Making Stable/Uncomplicated    Rehab Potential Good    PT Frequency 2x / week    PT Duration 6 weeks    PT Treatment/Interventions Gait training;Stair training;Functional mobility training;DME Instruction;Therapeutic activities;Therapeutic exercise;Balance training;Neuromuscular re-education;Patient/family education;Compression bandaging;Manual lymph drainage;Manual techniques    PT Next Visit Plan Continue with both chemical and mechanical debridement to remove adherent eschar to allow healing to occur    Consulted and Agree with Plan of Care Patient;Family member/caregiver    Family Member Consulted wife             Patient will benefit from skilled therapeutic intervention in order to improve the following deficits and impairments:  Abnormal gait, Decreased skin integrity, Decreased mobility, Impaired sensation  Visit Diagnosis: Other abnormalities of gait and mobility  Ulcer of right pretibial region, limited to breakdown of skin [facility])     Problem List Patient Active Problem List   Diagnosis Date Noted   Advanced nonexudative age-related macular degeneration of right eye without subfoveal involvement 11/15/2020   Advanced nonexudative age-related macular degeneration of left eye without subfoveal involvement 11/15/2020   Diabetes mellitus without complication ([place]) 03/70/4888   Posterior vitreous detachment of both eyes 11/15/2020   Gait disturbance, post-stroke 11/02/2017   Aphasia as late effect of cerebrovascular accident 11/02/2017    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   Aphasia    PAF (paroxysmal atrial fibrillation) ([place])    Diabetes mellitus type 2 in obese ([place])    Benign essential HTN    Hypothyroidism    Hyperlipidemia    Stroke (cerebrum) ([place]) 10/02/2017   Afib ([place]) 01/10/2017   SOB (shortness of breath) 91/69/4503   Acute diastolic CHF (congestive heart failure) ([place]) 01/10/2017   Atrial fibrillation with RVR ([place]) 01/10/2017   Claudication ([place]) 07/25/2016   Paroxysmal atrial fibrillation ([place]) 07/25/2016   Odynophagia 04/10/2014   Allergic rhinitis 03/23/2014   Chronic rhinitis 12/22/2013   Intrinsic asthma 07/15/2013   Constipation 08/22/2011   Obesity 09/20/2009   G E R D 11/29/2007   Celiac disease 11/29/2007  Rayetta Humphrey, PT CLT [phone]  09/10/2021, 3:52 PM  [place] [facility] [place], [place], [address] Phone: [phone]   Fax:  [phone]  Name: [name] MRN: [id number] Date of Birth: 05/09/1929

## 2021-09-11 ENCOUNTER — Other Ambulatory Visit (HOSPITAL_COMMUNITY): Payer: Self-pay | Admitting: Family Medicine

## 2021-09-11 ENCOUNTER — Other Ambulatory Visit: Payer: Self-pay | Admitting: Family Medicine

## 2021-09-11 ENCOUNTER — Other Ambulatory Visit: Payer: Self-pay | Admitting: Internal Medicine

## 2021-09-11 DIAGNOSIS — E1122 Type 2 diabetes mellitus with diabetic chronic kidney disease: Secondary | ICD-10-CM | POA: Diagnosis not present

## 2021-09-11 DIAGNOSIS — E211 Secondary hyperparathyroidism, not elsewhere classified: Secondary | ICD-10-CM | POA: Diagnosis not present

## 2021-09-11 DIAGNOSIS — D638 Anemia in other chronic diseases classified elsewhere: Secondary | ICD-10-CM | POA: Diagnosis not present

## 2021-09-11 DIAGNOSIS — I10 Essential (primary) hypertension: Secondary | ICD-10-CM | POA: Diagnosis not present

## 2021-09-11 DIAGNOSIS — D508 Other iron deficiency anemias: Secondary | ICD-10-CM | POA: Diagnosis not present

## 2021-09-11 DIAGNOSIS — E782 Mixed hyperlipidemia: Secondary | ICD-10-CM | POA: Diagnosis not present

## 2021-09-11 DIAGNOSIS — R809 Proteinuria, unspecified: Secondary | ICD-10-CM | POA: Diagnosis not present

## 2021-09-11 DIAGNOSIS — E1129 Type 2 diabetes mellitus with other diabetic kidney complication: Secondary | ICD-10-CM | POA: Diagnosis not present

## 2021-09-11 DIAGNOSIS — I129 Hypertensive chronic kidney disease with stage 1 through stage 4 chronic kidney disease, or unspecified chronic kidney disease: Secondary | ICD-10-CM | POA: Diagnosis not present

## 2021-09-11 DIAGNOSIS — N189 Chronic kidney disease, unspecified: Secondary | ICD-10-CM | POA: Diagnosis not present

## 2021-09-12 ENCOUNTER — Other Ambulatory Visit: Payer: Self-pay | Admitting: Family Medicine

## 2021-09-12 ENCOUNTER — Other Ambulatory Visit (HOSPITAL_COMMUNITY): Payer: Self-pay | Admitting: Family Medicine

## 2021-09-12 DIAGNOSIS — I87399 Chronic venous hypertension (idiopathic) with other complications of unspecified lower extremity: Secondary | ICD-10-CM

## 2021-09-13 ENCOUNTER — Other Ambulatory Visit: Payer: Self-pay

## 2021-09-13 ENCOUNTER — Ambulatory Visit (HOSPITAL_COMMUNITY): Payer: PPO | Attending: Internal Medicine | Admitting: Physical Therapy

## 2021-09-13 DIAGNOSIS — L97811 Non-pressure chronic ulcer of other part of right lower leg limited to breakdown of skin: Secondary | ICD-10-CM | POA: Insufficient documentation

## 2021-09-13 DIAGNOSIS — R2689 Other abnormalities of gait and mobility: Secondary | ICD-10-CM | POA: Insufficient documentation

## 2021-09-13 NOTE — Therapy (Signed)
[place], [place], [address] Phone: [phone]   Fax:  [phone]  Wound Care Therapy  Patient Details  Name: [name] MRN: [id number] Date of Birth: 1929/01/29 Referring Provider (PT): [name]   Encounter Date: 09/13/2021   PT End of Session - 09/13/21 1607     Visit Number 91    Number of Visits 72    Date for PT Re-Evaluation 10/01/21    Authorization Type [employer] (no auth req, no visit limit)    Progress Note Due on Visit 93    PT Start Time 1445    PT Stop Time 1530    PT Time Calculation (min) 45 min    Activity Tolerance Patient tolerated treatment well    Behavior During Therapy [facility] for tasks assessed/performed             Past Medical History:  Diagnosis Date   Allergic rhinitis    Aortic stenosis    Asthmatic bronchitis    Celiac disease    Cellulitis    CKD (chronic kidney disease)    Diverticulosis    Fx. left wrist 1987   Gastric polyps    GERD (gastroesophageal reflux disease)    History of pneumonia    Hypertension    Hypothyroidism    Iron deficiency anemia    Melanoma ([place])    Neuropathy    PAF (paroxysmal atrial fibrillation) ([place])    Prostate cancer ([place])    REM sleep behavior disorder    RLS (restless legs syndrome)    Sleep apnea    Spasticity    Stroke ([place]) 09/2017   Tremor    Type 2 diabetes mellitus ([place])     Past Surgical History:  Procedure Laterality Date   APPENDECTOMY  1978   CARPAL TUNNEL RELEASE Left    CATARACT EXTRACTION  03/2011   bilateral    CHOLECYSTECTOMY  2001   COLONOSCOPY W/ BIOPSIES  04/27/2008   diverticulosis, duodenitis   FOOT SURGERY     HERNIA REPAIR  1994   bilateral   KNEE SURGERY Bilateral    x 2   LUNG SURGERY  2001   for infection   PROSTATECTOMY  2002   UPPER GASTROINTESTINAL ENDOSCOPY  04/27/2008   celiac disease, gastric polyps    There were no vitals filed for this visit.      [facility] PT  Assessment - 09/13/21 0001       Assessment   Medical Diagnosis Chronic venous stasis wound    Referring Provider (PT) [name]                     Wound Therapy - 09/13/21 0001     Subjective REports pain at night but no pain at rest    Patient and Family Stated Goals wound to heal    Date of Onset 09/06/20    Prior Treatments MD and self care    Pain Scale Faces    Faces Pain Scale Hurts whole lot    Pain Type Acute pain    Pain Location Leg    Pain Orientation Lower    Pain Descriptors / Indicators Sharp    Pain Intervention(s) Emotional support    Evaluation and Treatment Procedures Explained to Patient/Family Yes    Evaluation and Treatment Procedures agreed to    Wound Properties Date First Assessed: 04/26/21 Time First Assessed: 9030 Wound Type: Non-pressure wound Location: Leg Location  Orientation: Right Wound Description (Comments): superior to original wound Present on Admission: No   Wound Image Images linked: 1    Dressing Type Compression wrap   santyl   Dressing Changed Changed    Dressing Status Old drainage    Dressing Change Frequency PRN    Site / Wound Assessment Yellow    % Wound base Red or Granulating 5%    % Wound base Yellow/Fibrinous Exudate 95%    Peri-wound Assessment Erythema (blanchable);Edema    Wound Length (cm) 2 cm    Wound Width (cm) 2 cm    Wound Depth (cm) 0.2 cm    Wound Volume (cm^3) 0.8 cm^3    Wound Surface Area (cm^2) 4 cm^2    Margins Attached edges (approximated)    Drainage Amount Minimal    Drainage Description Serosanguineous    Treatment Debridement (Selective);Cleansed    Wound Properties Date First Assessed: 12/04/20 Time First Assessed: 3338 Wound Type: Venous stasis ulcer Location: Leg Location Orientation: Right Present on Admission: Yes   Dressing Type Compression wrap    Dressing Changed Changed    Dressing Status Old drainage    Dressing Change Frequency PRN    Site / Wound Assessment Yellow    % Wound  base Red or Granulating 40%    % Wound base Yellow/Fibrinous Exudate 60%    Peri-wound Assessment Erythema (blanchable);Edema    Wound Length (cm) 5.4 cm    Wound Width (cm) 2.2 cm    Wound Depth (cm) 0.4 cm    Wound Volume (cm^3) 4.75 cm^3    Wound Surface Area (cm^2) 11.88 cm^2    Margins Unattached edges (unapproximated)    Drainage Amount Minimal    Drainage Description Serous;Green;No odor    Treatment Cleansed;Debridement (Selective)    Selective Debridement - Location both wounds to patient's tolerance.    Selective Debridement - Tools Used Forceps;Scalpel    Selective Debridement - Tissue Removed slough    Wound Therapy - Clinical Statement Superior wound still with high pain and poor tolerance to debridement. Minimal change in granulation status with santyl, changing dressing to superior wound to medihoney to see if this improves autolytic debridement. Wounds measuring about the same and overall leg looks better in regards to raw/dry skin. Continued with santyl along inferior wound. Will assess paitent's response to change in dressing. If wounds increase in size with message MD about increasing frequency in antibiotics. Continued with lymph netting and light wrapping of profore lite.    Wound Therapy - Functional Problem List gait and balance    Factors Delaying/Impairing Wound Healing Diabetes Mellitus;Vascular compromise;Altered sensation    Wound Therapy - Frequency 2X / week    Wound Plan Assess LE if it is looking better continue if worse contact MD about the possibility of increasing antibiotic back up to twice a day vs. one time a day which is where pt has been for the past week.    Dressing  cleanse followed by moisturizing,Inferior: santyl to wound bed and hydrogel f/b 4x4, and profore lite dressing.    Dressing superiod wound: medihoney 2x2                       PT Short Term Goals - 07/12/21 1627       PT SHORT TERM GOAL #1   Title Patient will have at  least 50% granualtion tissue.    Status Partially Met  PT Long Term Goals - 07/12/21 1627       PT LONG TERM GOAL #1   Title Patient's wound will be healed to reduce the risk of infection.    Status On-going      PT LONG TERM GOAL #2   Title Patient will be able to verbalize completion of daily skin checks to reduce the risk of further wound development.    Status On-going                    Patient will benefit from skilled therapeutic intervention in order to improve the following deficits and impairments:     Visit Diagnosis: Other abnormalities of gait and mobility  Ulcer of right pretibial region, limited to breakdown of skin [facility])     Problem List Patient Active Problem List   Diagnosis Date Noted   Advanced nonexudative age-related macular degeneration of right eye without subfoveal involvement 11/15/2020   Advanced nonexudative age-related macular degeneration of left eye without subfoveal involvement 11/15/2020   Diabetes mellitus without complication ([place]) 03/52/4818   Posterior vitreous detachment of both eyes 11/15/2020   Gait disturbance, post-stroke 11/02/2017   Aphasia as late effect of cerebrovascular accident 11/02/2017   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   Aphasia    PAF (paroxysmal atrial fibrillation) ([place])    Diabetes mellitus type 2 in obese ([place])    Benign essential HTN    Hypothyroidism    Hyperlipidemia    Stroke (cerebrum) ([place]) 10/02/2017   Afib ([place]) 01/10/2017   SOB (shortness of breath) 59/06/3111   Acute diastolic CHF (congestive heart failure) ([place]) 01/10/2017   Atrial fibrillation with RVR ([place]) 01/10/2017   Claudication ([place]) 07/25/2016   Paroxysmal atrial fibrillation ([place]) 07/25/2016   Odynophagia 04/10/2014   Allergic rhinitis 03/23/2014   Chronic rhinitis 12/22/2013   Intrinsic asthma 07/15/2013   Constipation 08/22/2011   Obesity 09/20/2009   G E R D 11/29/2007   Celiac  disease 11/29/2007   4:11 PM, 09/13/21 Jerene Pitch, DPT Physical Therapy with [facility]  [phone] office   [place] [address] [place], [place], [address] Phone: [phone]   Fax:  [phone]  Name: [name] MRN: [id number] Date of Birth: Nov 05, 1928

## 2021-09-17 ENCOUNTER — Ambulatory Visit (HOSPITAL_COMMUNITY): Payer: PPO | Admitting: Physical Therapy

## 2021-09-17 ENCOUNTER — Encounter (HOSPITAL_COMMUNITY): Payer: Self-pay | Admitting: Physical Therapy

## 2021-09-17 ENCOUNTER — Other Ambulatory Visit: Payer: Self-pay

## 2021-09-17 DIAGNOSIS — R2689 Other abnormalities of gait and mobility: Secondary | ICD-10-CM | POA: Diagnosis not present

## 2021-09-17 DIAGNOSIS — L97811 Non-pressure chronic ulcer of other part of right lower leg limited to breakdown of skin: Secondary | ICD-10-CM

## 2021-09-17 NOTE — Therapy (Signed)
[place], [place], [address] Phone: [phone]   Fax:  [phone]  Wound Care Therapy  Patient Details  Name: [name] MRN: [id number] Date of Birth: 1929/01/15 Referring Provider (PT): [name]   Encounter Date: 09/17/2021   PT End of Session - 09/17/21 1444     Visit Number 92    Number of Visits 97    Date for PT Re-Evaluation 10/01/21    Authorization Type [employer] (no auth req, no visit limit)    Progress Note Due on Visit 93    PT Start Time 1445    PT Stop Time 1530    PT Time Calculation (min) 45 min    Activity Tolerance Patient tolerated treatment well    Behavior During Therapy [facility] for tasks assessed/performed             Past Medical History:  Diagnosis Date   Allergic rhinitis    Aortic stenosis    Asthmatic bronchitis    Celiac disease    Cellulitis    CKD (chronic kidney disease)    Diverticulosis    Fx. left wrist 1987   Gastric polyps    GERD (gastroesophageal reflux disease)    History of pneumonia    Hypertension    Hypothyroidism    Iron deficiency anemia    Melanoma ([place])    Neuropathy    PAF (paroxysmal atrial fibrillation) ([place])    Prostate cancer ([place])    REM sleep behavior disorder    RLS (restless legs syndrome)    Sleep apnea    Spasticity    Stroke ([place]) 09/2017   Tremor    Type 2 diabetes mellitus ([place])     Past Surgical History:  Procedure Laterality Date   APPENDECTOMY  1978   CARPAL TUNNEL RELEASE Left    CATARACT EXTRACTION  03/2011   bilateral    CHOLECYSTECTOMY  2001   COLONOSCOPY W/ BIOPSIES  04/27/2008   diverticulosis, duodenitis   FOOT SURGERY     HERNIA REPAIR  1994   bilateral   KNEE SURGERY Bilateral    x 2   LUNG SURGERY  2001   for infection   PROSTATECTOMY  2002   UPPER GASTROINTESTINAL ENDOSCOPY  04/27/2008   celiac disease, gastric polyps    There were no vitals filed for this  visit.               Wound Therapy - 09/17/21 0001     Subjective Leg has been sore    Patient and Family Stated Goals wound to heal    Date of Onset 09/06/20    Prior Treatments MD and self care    Faces Pain Scale Hurts whole lot    Evaluation and Treatment Procedures Explained to Patient/Family Yes    Evaluation and Treatment Procedures agreed to    Wound Properties Date First Assessed: 04/26/21 Time First Assessed: 3419 Wound Type: Non-pressure wound Location: Leg Location Orientation: Right Wound Description (Comments): superior to original wound Present on Admission: No   Dressing Type Compression wrap    Dressing Changed Changed    Dressing Status Old drainage    Dressing Change Frequency PRN    Site / Wound Assessment Yellow    % Wound base Red or Granulating 10%    % Wound base Yellow/Fibrinous Exudate 90%    Peri-wound Assessment Erythema (blanchable)    Margins Attached edges (approximated)    Drainage Amount  Minimal    Drainage Description Serosanguineous    Treatment Debridement (Selective);Cleansed    Wound Properties Date First Assessed: 12/04/20 Time First Assessed: 1545 Wound Type: Venous stasis ulcer Location: Leg Location Orientation: Right Present on Admission: Yes   Dressing Type Compression wrap    Dressing Changed Changed    Dressing Status Old drainage    Dressing Change Frequency PRN    Site / Wound Assessment Yellow    % Wound base Red or Granulating 30%    % Wound base Yellow/Fibrinous Exudate 70%    Peri-wound Assessment Erythema (blanchable)    Margins Unattached edges (unapproximated)    Drainage Amount Minimal    Drainage Description Serosanguineous    Treatment Cleansed;Debridement (Selective)    Selective Debridement - Location both wounds to patient's tolerance.    Selective Debridement - Tools Used Forceps;Scalpel    Selective Debridement - Tissue Removed slough    Wound Therapy - Clinical Statement Patient with continued dry skin  and erythema in RLE but less than prior sessions. Wounds continue to be mostly slough and they are painful limiting debridement. Continued with same dressings as last session.    Wound Therapy - Functional Problem List gait and balance    Factors Delaying/Impairing Wound Healing Diabetes Mellitus;Vascular compromise;Altered sensation    Wound Therapy - Frequency 2X / week    Wound Plan Assess LE if it is looking better continue if worse contact MD about the possibility of increasing antibiotic back up to twice a day vs. one time a day which is where pt has been for the past week.    Dressing  cleanse followed by moisturizing,Inferior: santyl to wound bed and hydrogel f/b 4x4, and profore lite dressing.    Dressing superiod wound: medihoney 2x2                       PT Short Term Goals - 07/12/21 1627       PT SHORT TERM GOAL #1   Title Patient will have at least 50% granualtion tissue.    Status Partially Met               PT Long Term Goals - 07/12/21 1627       PT LONG TERM GOAL #1   Title Patient's wound will be healed to reduce the risk of infection.    Status On-going      PT LONG TERM GOAL #2   Title Patient will be able to verbalize completion of daily skin checks to reduce the risk of further wound development.    Status On-going                   Plan - 09/17/21 1444     Clinical Impression Statement see above    Personal Factors and Comorbidities Age;Comorbidity 3+;Past/Current Experience;Fitness;Time since onset of injury/illness/exacerbation    Comorbidities DM, CKD, neuorpathy, Hx CVA    Examination-Activity Limitations Locomotion Level;Transfers;Squat;Stand;Dressing    Examination-Participation Restrictions Community Activity    Stability/Clinical Decision Making Stable/Uncomplicated    Rehab Potential Good    PT Frequency 2x / week    PT Duration 6 weeks    PT Treatment/Interventions Gait training;Stair training;Functional mobility  training;DME Instruction;Therapeutic activities;Therapeutic exercise;Balance training;Neuromuscular re-education;Patient/family education;Compression bandaging;Manual lymph drainage;Manual techniques    PT Next Visit Plan Continue with both chemical and mechanical debridement to remove adherent eschar to allow healing to occur    Consulted and Agree with Plan of Care Patient;Family member/caregiver  Family Member Consulted wife             Patient will benefit from skilled therapeutic intervention in order to improve the following deficits and impairments:  Abnormal gait, Decreased skin integrity, Decreased mobility, Impaired sensation  Visit Diagnosis: Other abnormalities of gait and mobility  Ulcer of right pretibial region, limited to breakdown of skin [facility])     Problem List Patient Active Problem List   Diagnosis Date Noted   Advanced nonexudative age-related macular degeneration of right eye without subfoveal involvement 11/15/2020   Advanced nonexudative age-related macular degeneration of left eye without subfoveal involvement 11/15/2020   Diabetes mellitus without complication ([place]) 94/06/501   Posterior vitreous detachment of both eyes 11/15/2020   Gait disturbance, post-stroke 11/02/2017   Aphasia as late effect of cerebrovascular accident 11/02/2017   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   Aphasia    PAF (paroxysmal atrial fibrillation) ([place])    Diabetes mellitus type 2 in obese ([place])    Benign essential HTN    Hypothyroidism    Hyperlipidemia    Stroke (cerebrum) ([place]) 10/02/2017   Afib ([place]) 01/10/2017   SOB (shortness of breath) 56/15/4884   Acute diastolic CHF (congestive heart failure) ([place]) 01/10/2017   Atrial fibrillation with RVR ([place]) 01/10/2017   Claudication ([place]) 07/25/2016   Paroxysmal atrial fibrillation ([place]) 07/25/2016   Odynophagia 04/10/2014   Allergic rhinitis 03/23/2014   Chronic rhinitis 12/22/2013   Intrinsic asthma  07/15/2013   Constipation 08/22/2011   Obesity 09/20/2009   G E R D 11/29/2007   Celiac disease 11/29/2007    3:37 PM, 09/17/21 Mearl Latin PT, DPT Physical Therapist at [place], [place], [address] Phone: [phone]   Fax:  [phone]  Name: IRVIN BASTIN MRN: [id number] Date of Birth: 1929/08/25

## 2021-09-19 ENCOUNTER — Encounter (HOSPITAL_COMMUNITY): Payer: Self-pay

## 2021-09-19 ENCOUNTER — Encounter (HOSPITAL_COMMUNITY)
Admission: RE | Admit: 2021-09-19 | Discharge: 2021-09-19 | Disposition: A | Discharge status: Home / Self Care | Payer: PPO | Source: Ambulatory Visit | Attending: Nephrology | Admitting: Nephrology

## 2021-09-19 DIAGNOSIS — D509 Iron deficiency anemia, unspecified: Secondary | ICD-10-CM | POA: Diagnosis not present

## 2021-09-19 MED ORDER — SODIUM CHLORIDE 0.9 % IV SOLN
510.0000 mg | Freq: Once | INTRAVENOUS | Status: AC
  Administered 2021-09-19: 510 mg via INTRAVENOUS
  Filled 2021-09-19: qty 17

## 2021-09-19 MED ORDER — SODIUM CHLORIDE 0.9 % IV SOLN: Freq: Once | INTRAVENOUS | Status: AC

## 2021-09-20 ENCOUNTER — Other Ambulatory Visit: Payer: Self-pay

## 2021-09-20 ENCOUNTER — Ambulatory Visit (HOSPITAL_COMMUNITY): Payer: PPO | Admitting: Physical Therapy

## 2021-09-20 DIAGNOSIS — R2689 Other abnormalities of gait and mobility: Secondary | ICD-10-CM

## 2021-09-20 DIAGNOSIS — L97811 Non-pressure chronic ulcer of other part of right lower leg limited to breakdown of skin: Secondary | ICD-10-CM

## 2021-09-20 NOTE — Therapy (Signed)
[place], [place], [address] Phone: [phone]   Fax:  [phone]  Wound Care Therapy Progress Note Reporting Period 08/20/2021 to 09/20/2021  See note below for Objective Data and Assessment of Progress/Goals.     Patient Details  Name: JERIMY JOHANSON MRN: [id number] Date of Birth: 06-17-1929 Referring Provider (PT): [name]   Encounter Date: 09/20/2021   PT End of Session - 09/20/21 1216     Visit Number 93    Number of Visits 48    Date for PT Re-Evaluation 10/01/21    Authorization Type [employer] (no auth req, no visit limit)    Progress Note Due on Visit 103    PT Start Time 1132    PT Stop Time 1210    PT Time Calculation (min) 38 min    Activity Tolerance Patient tolerated treatment well    Behavior During Therapy [facility] for tasks assessed/performed             Past Medical History:  Diagnosis Date   Allergic rhinitis    Aortic stenosis    Asthmatic bronchitis    Celiac disease    Cellulitis    CKD (chronic kidney disease)    Diverticulosis    Fx. left wrist 1987   Gastric polyps    GERD (gastroesophageal reflux disease)    History of pneumonia    Hypertension    Hypothyroidism    Iron deficiency anemia    Melanoma ([place])    Neuropathy    PAF (paroxysmal atrial fibrillation) ([facility])    Prostate cancer ([place])    REM sleep behavior disorder    RLS (restless legs syndrome)    Sleep apnea    Spasticity    Stroke ([place]) 09/2017   Tremor    Type 2 diabetes mellitus ([place])     Past Surgical History:  Procedure Laterality Date   APPENDECTOMY  1978   CARPAL TUNNEL RELEASE Left    CATARACT EXTRACTION  03/2011   bilateral    CHOLECYSTECTOMY  2001   COLONOSCOPY W/ BIOPSIES  04/27/2008   diverticulosis, duodenitis   FOOT SURGERY     HERNIA REPAIR  1994   bilateral   KNEE SURGERY Bilateral    x 2   LUNG SURGERY  2001   for infection   PROSTATECTOMY  2002   UPPER  GASTROINTESTINAL ENDOSCOPY  04/27/2008   celiac disease, gastric polyps    There were no vitals filed for this visit.               Wound Therapy - 09/20/21 0001     Subjective Leg contintues to be sore, especially the superior woundhas been sore    Patient and Family Stated Goals wound to heal    Date of Onset 09/06/20    Prior Treatments MD and self care    Pain Scale 0-10    Pain Score 0-No pain    Evaluation and Treatment Procedures Explained to Patient/Family Yes    Evaluation and Treatment Procedures agreed to    Wound Properties Date First Assessed: 04/26/21 Time First Assessed: 6579 Wound Type: Non-pressure wound Location: Leg Location Orientation: Right Wound Description (Comments): superior to original wound Present on Admission: No   Wound Image Images linked: 1    Dressing Type Compression wrap;Santyl    Dressing Changed Changed    Dressing Status Old drainage    Dressing Change Frequency PRN    Site / Wound  Assessment Yellow;Red;Pink    % Wound base Red or Granulating 10%    % Wound base Yellow/Fibrinous Exudate 90%    Peri-wound Assessment Erythema (blanchable)    Wound Length (cm) 2 cm    Wound Width (cm) 1.9 cm    Wound Depth (cm) 0.2 cm    Wound Volume (cm^3) 0.76 cm^3    Wound Surface Area (cm^2) 3.8 cm^2    Margins Attached edges (approximated)    Drainage Amount Moderate    Drainage Description Serosanguineous;Odor    Treatment Cleansed;Debridement (Selective)    Wound Properties Date First Assessed: 12/04/20 Time First Assessed: 1545 Wound Type: Venous stasis ulcer Location: Leg Location Orientation: Right Present on Admission: Yes   Wound Image Images linked: 1    Dressing Type Compression wrap;Santyl    Dressing Changed Changed    Dressing Status Old drainage    Dressing Change Frequency PRN    Site / Wound Assessment Yellow;Pink;Red    % Wound base Red or Granulating 30%    % Wound base Yellow/Fibrinous Exudate 70%    Peri-wound  Assessment Erythema (blanchable)    Wound Length (cm) 5.3 cm    Wound Width (cm) 2 cm    Wound Depth (cm) 0.4 cm    Wound Volume (cm^3) 4.24 cm^3    Wound Surface Area (cm^2) 10.6 cm^2    Margins Unattached edges (unapproximated)    Drainage Amount Moderate    Drainage Description Serosanguineous;Odor    Treatment Cleansed;Debridement (Selective)    Selective Debridement - Location both wounds to patient's tolerance.    Selective Debridement - Tools Used Forceps;Scalpel    Selective Debridement - Tissue Removed slough    Wound Therapy - Clinical Statement wounds photographed and measured this session.  Slight odor upon dressing removal and more drainage.  Unsure if odor comes from woundbed or stagnant drainage.  Overall size has reduced slightly with both wounds.  Debrided and cleansed.  Changed dressing to silver hydrofiber to see if this helps reduce odor and reduce moisture.  Applied antifungal to areas and lotion to others.    Wound Therapy - Functional Problem List gait and balance    Factors Delaying/Impairing Wound Healing Diabetes Mellitus;Vascular compromise;Altered sensation    Wound Therapy - Frequency 2X / week    Wound Plan Assess LE if it is looking better after changing dressings.  If continues with odor/worse contact MD about the possibility of increasing antibiotic back up to twice a day vs. one time a day which is where pt has been for the past week.    Dressing  Cleanse with gauze; antifungal to skin and lotion over.  Silver hydrofiber to woundbeds.  Profore lite with 1 and 2 layer reversed.  Cotton only used in negative space. #5 netting.                       PT Short Term Goals - 07/12/21 1627       PT SHORT TERM GOAL #1   Title Patient will have at least 50% granualtion tissue.    Status Partially Met               PT Long Term Goals - 07/12/21 1627       PT LONG TERM GOAL #1   Title Patient's wound will be healed to reduce the risk of  infection.    Status On-going      PT LONG TERM GOAL #2   Title Patient will  be able to verbalize completion of daily skin checks to reduce the risk of further wound development.    Status On-going                    Patient will benefit from skilled therapeutic intervention in order to improve the following deficits and impairments:     Visit Diagnosis: Other abnormalities of gait and mobility  Ulcer of right pretibial region, limited to breakdown of skin [facility])     Problem List Patient Active Problem List   Diagnosis Date Noted   Advanced nonexudative age-related macular degeneration of right eye without subfoveal involvement 11/15/2020   Advanced nonexudative age-related macular degeneration of left eye without subfoveal involvement 11/15/2020   Diabetes mellitus without complication ([place]) 96/28/3662   Posterior vitreous detachment of both eyes 11/15/2020   Gait disturbance, post-stroke 11/02/2017   Aphasia as late effect of cerebrovascular accident 11/02/2017   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   Aphasia    PAF (paroxysmal atrial fibrillation) ([place])    Diabetes mellitus type 2 in obese ([place])    Benign essential HTN    Hypothyroidism    Hyperlipidemia    Stroke (cerebrum) ([place]) 10/02/2017   Afib ([place]) 01/10/2017   SOB (shortness of breath) 94/76/5465   Acute diastolic CHF (congestive heart failure) ([place]) 01/10/2017   Atrial fibrillation with RVR ([place]) 01/10/2017   Claudication ([place]) 07/25/2016   Paroxysmal atrial fibrillation ([place]) 07/25/2016   Odynophagia 04/10/2014   Allergic rhinitis 03/23/2014   Chronic rhinitis 12/22/2013   Intrinsic asthma 07/15/2013   Constipation 08/22/2011   Obesity 09/20/2009   G E R D 11/29/2007   Celiac disease 11/29/2007   Teena Irani, PTA/CLT, WTA [phone]  Teena Irani, PTA 09/20/2021, 12:25 PM  [place] [address] [place], [place],  [address] Phone: [phone]   Fax:  [phone]  Name: KEASTON PILE MRN: [id number] Date of Birth: January 19, 1929

## 2021-09-24 ENCOUNTER — Encounter (HOSPITAL_COMMUNITY): Payer: Self-pay

## 2021-09-24 ENCOUNTER — Other Ambulatory Visit: Payer: Self-pay

## 2021-09-24 ENCOUNTER — Ambulatory Visit (HOSPITAL_COMMUNITY): Payer: PPO

## 2021-09-24 DIAGNOSIS — R2689 Other abnormalities of gait and mobility: Secondary | ICD-10-CM

## 2021-09-24 DIAGNOSIS — L97811 Non-pressure chronic ulcer of other part of right lower leg limited to breakdown of skin: Secondary | ICD-10-CM

## 2021-09-24 NOTE — Therapy (Signed)
[place], [place], [address] Phone: [phone]   Fax:  [phone]  Wound Care Therapy  Patient Details  Name: [name] MRN: [id number] Date of Birth: Aug 22, 1929 Referring Provider (PT): [name]   Encounter Date: 09/24/2021   PT End of Session - 09/24/21 1530     Visit Number 94    Number of Visits 97    Date for PT Re-Evaluation 10/01/21    Authorization Type [employer] (no auth req, no visit limit)    Progress Note Due on Visit 103    PT Start Time 1448    PT Stop Time 1520    PT Time Calculation (min) 32 min    Activity Tolerance Patient tolerated treatment well    Behavior During Therapy [facility] for tasks assessed/performed             Past Medical History:  Diagnosis Date   Allergic rhinitis    Aortic stenosis    Asthmatic bronchitis    Celiac disease    Cellulitis    CKD (chronic kidney disease)    Diverticulosis    Fx. left wrist 1987   Gastric polyps    GERD (gastroesophageal reflux disease)    History of pneumonia    Hypertension    Hypothyroidism    Iron deficiency anemia    Melanoma ([place])    Neuropathy    PAF (paroxysmal atrial fibrillation) ([facility])    Prostate cancer ([place])    REM sleep behavior disorder    RLS (restless legs syndrome)    Sleep apnea    Spasticity    Stroke ([place]) 09/2017   Tremor    Type 2 diabetes mellitus ([place])     Past Surgical History:  Procedure Laterality Date   APPENDECTOMY  1978   CARPAL TUNNEL RELEASE Left    CATARACT EXTRACTION  03/2011   bilateral    CHOLECYSTECTOMY  2001   COLONOSCOPY W/ BIOPSIES  04/27/2008   diverticulosis, duodenitis   FOOT SURGERY     HERNIA REPAIR  1994   bilateral   KNEE SURGERY Bilateral    x 2   LUNG SURGERY  2001   for infection   PROSTATECTOMY  2002   UPPER GASTROINTESTINAL ENDOSCOPY  04/27/2008   celiac disease, gastric polyps    There were no vitals filed for this visit.    Subjective  Assessment - 09/24/21 1523     Subjective Reports of comfort, arrived wiht dressings intact.  No reports of pain today.    Currently in Pain? No/denies                       Wound Therapy - 09/24/21 0001     Subjective Reports of comfort, arrived wiht dressings intact.  No reports of pain today.    Patient and Family Stated Goals wound to heal    Date of Onset 09/06/20    Prior Treatments MD and self care    Pain Scale 0-10    Pain Score 0-No pain    Evaluation and Treatment Procedures Explained to Patient/Family Yes    Evaluation and Treatment Procedures agreed to    Wound Properties Date First Assessed: 04/26/21 Time First Assessed: 9480 Wound Type: Non-pressure wound Location: Leg Location Orientation: Right Wound Description (Comments): superior to original wound Present on Admission: No   Dressing Type Silver hydrofiber;Compression wrap   antifungal cream, lotion, silver hydrofiber with profore lite 2  before 1, netting #5   Dressing Changed Changed    Dressing Status Old drainage    Dressing Change Frequency PRN    Site / Wound Assessment Yellow;Red;Pink    % Wound base Red or Granulating 10%    % Wound base Yellow/Fibrinous Exudate 90%    Peri-wound Assessment Erythema (blanchable)    Margins Attached edges (approximated)    Drainage Amount Moderate    Drainage Description Serosanguineous    Treatment Cleansed;Debridement (Selective)    Wound Properties Date First Assessed: 12/04/20 Time First Assessed: 1545 Wound Type: Venous stasis ulcer Location: Leg Location Orientation: Right Present on Admission: Yes   Dressing Type Silver hydrofiber;Compression wrap   antifungal cream, lotion, silver hydrofiber with profore lite 2 before 1, netting #5   Dressing Changed Changed    Dressing Status Old drainage    Dressing Change Frequency PRN    Site / Wound Assessment Yellow;Pink;Red    % Wound base Red or Granulating 30%    % Wound base Yellow/Fibrinous Exudate 70%     Peri-wound Assessment Erythema (blanchable)    Margins Unattached edges (unapproximated)    Drainage Amount Moderate    Drainage Description Serosanguineous    Treatment Cleansed;Debridement (Selective)    Selective Debridement - Location both wounds to patient's tolerance.    Selective Debridement - Tools Used Forceps;Scalpel    Selective Debridement - Tissue Removed slough    Wound Therapy - Clinical Statement Light odor present upon removal of dressings, unsure if from wound bed or stagnant drainage.  Selective debridement for removal of adherent slough from wound bed.  Continued with silverhydrofiber to address odor and drainage with profore lite for edema control.  Reports of comfort at EOS.    Wound Therapy - Functional Problem List gait and balance    Factors Delaying/Impairing Wound Healing Diabetes Mellitus;Vascular compromise;Altered sensation    Wound Therapy - Frequency 2X / week    Wound Plan Assess LE if it is looking better after changing dressings.  If continues with odor/worse contact MD about the possibility of increasing antibiotic back up to twice a day vs. one time a day which is where pt has been for the past week.                       PT Short Term Goals - 07/12/21 1627       PT SHORT TERM GOAL #1   Title Patient will have at least 50% granualtion tissue.    Status Partially Met               PT Long Term Goals - 07/12/21 1627       PT LONG TERM GOAL #1   Title Patient's wound will be healed to reduce the risk of infection.    Status On-going      PT LONG TERM GOAL #2   Title Patient will be able to verbalize completion of daily skin checks to reduce the risk of further wound development.    Status On-going                    Patient will benefit from skilled therapeutic intervention in order to improve the following deficits and impairments:     Visit Diagnosis: Other abnormalities of gait and mobility  Ulcer of right  pretibial region, limited to breakdown of skin [facility])     Problem List Patient Active Problem List   Diagnosis Date Noted   Advanced  nonexudative age-related macular degeneration of right eye without subfoveal involvement 11/15/2020   Advanced nonexudative age-related macular degeneration of left eye without subfoveal involvement 11/15/2020   Diabetes mellitus without complication ([place]) 82/95/6213   Posterior vitreous detachment of both eyes 11/15/2020   Gait disturbance, post-stroke 11/02/2017   Aphasia as late effect of cerebrovascular accident 11/02/2017   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   Aphasia    PAF (paroxysmal atrial fibrillation) ([place])    Diabetes mellitus type 2 in obese ([place])    Benign essential HTN    Hypothyroidism    Hyperlipidemia    Stroke (cerebrum) ([place]) 10/02/2017   Afib ([place]) 01/10/2017   SOB (shortness of breath) 08/65/7846   Acute diastolic CHF (congestive heart failure) ([place]) 01/10/2017   Atrial fibrillation with RVR ([place]) 01/10/2017   Claudication ([place]) 07/25/2016   Paroxysmal atrial fibrillation ([place]) 07/25/2016   Odynophagia 04/10/2014   Allergic rhinitis 03/23/2014   Chronic rhinitis 12/22/2013   Intrinsic asthma 07/15/2013   Constipation 08/22/2011   Obesity 09/20/2009   G E R D 11/29/2007   Celiac disease 11/29/2007   [name], [name]; CBIS [phone]  [name], [name] 09/24/2021, 3:31 PM  [place], [place], [address] Phone: [phone]   Fax:  [phone]  Name: [name] MRN: [id number] Date of Birth: 09/26/1929

## 2021-09-26 ENCOUNTER — Encounter (HOSPITAL_COMMUNITY): Payer: Self-pay

## 2021-09-26 ENCOUNTER — Encounter (HOSPITAL_COMMUNITY)
Admission: RE | Admit: 2021-09-26 | Discharge: 2021-09-26 | Disposition: A | Discharge status: Home / Self Care | Payer: PPO | Source: Ambulatory Visit | Attending: Nephrology | Admitting: Nephrology

## 2021-09-26 ENCOUNTER — Other Ambulatory Visit: Payer: Self-pay

## 2021-09-26 DIAGNOSIS — D509 Iron deficiency anemia, unspecified: Secondary | ICD-10-CM | POA: Diagnosis not present

## 2021-09-26 MED ORDER — SODIUM CHLORIDE 0.9 % IV SOLN
510.0000 mg | Freq: Once | INTRAVENOUS | Status: AC
  Administered 2021-09-26: 510 mg via INTRAVENOUS
  Filled 2021-09-26: qty 510

## 2021-09-26 MED ORDER — SODIUM CHLORIDE 0.9 % IV SOLN: Freq: Once | INTRAVENOUS | Status: AC

## 2021-09-27 ENCOUNTER — Ambulatory Visit (HOSPITAL_COMMUNITY): Payer: PPO | Admitting: Physical Therapy

## 2021-09-27 DIAGNOSIS — L97811 Non-pressure chronic ulcer of other part of right lower leg limited to breakdown of skin: Secondary | ICD-10-CM

## 2021-09-27 DIAGNOSIS — B351 Tinea unguium: Secondary | ICD-10-CM | POA: Diagnosis not present

## 2021-09-27 DIAGNOSIS — E1142 Type 2 diabetes mellitus with diabetic polyneuropathy: Secondary | ICD-10-CM | POA: Diagnosis not present

## 2021-09-27 DIAGNOSIS — R2689 Other abnormalities of gait and mobility: Secondary | ICD-10-CM

## 2021-09-27 NOTE — Therapy (Signed)
[place], [place], [address] Phone: [phone]   Fax:  [phone]  Wound Care Therapy  Patient Details  Name: [name] MRN: [id number] Date of Birth: Jun 27, 1929 Referring Provider (PT): [name]   Encounter Date: 09/27/2021   PT End of Session - 09/27/21 1651     Visit Number 95    Number of Visits 7    Date for PT Re-Evaluation 10/01/21    Authorization Type [employer] (no auth req, no visit limit)    Progress Note Due on Visit 103    PT Start Time 9417    PT Stop Time 1525    PT Time Calculation (min) 40 min    Activity Tolerance Patient tolerated treatment well    Behavior During Therapy [facility] for tasks assessed/performed             Past Medical History:  Diagnosis Date   Allergic rhinitis    Aortic stenosis    Asthmatic bronchitis    Celiac disease    Cellulitis    CKD (chronic kidney disease)    Diverticulosis    Fx. left wrist 1987   Gastric polyps    GERD (gastroesophageal reflux disease)    History of pneumonia    Hypertension    Hypothyroidism    Iron deficiency anemia    Melanoma ([place])    Neuropathy    PAF (paroxysmal atrial fibrillation) ([place])    Prostate cancer ([place])    REM sleep behavior disorder    RLS (restless legs syndrome)    Sleep apnea    Spasticity    Stroke ([place]) 09/2017   Tremor    Type 2 diabetes mellitus ([place])     Past Surgical History:  Procedure Laterality Date   APPENDECTOMY  1978   CARPAL TUNNEL RELEASE Left    CATARACT EXTRACTION  03/2011   bilateral    CHOLECYSTECTOMY  2001   COLONOSCOPY W/ BIOPSIES  04/27/2008   diverticulosis, duodenitis   FOOT SURGERY     HERNIA REPAIR  1994   bilateral   KNEE SURGERY Bilateral    x 2   LUNG SURGERY  2001   for infection   PROSTATECTOMY  2002   UPPER GASTROINTESTINAL ENDOSCOPY  04/27/2008   celiac disease, gastric polyps    There were no vitals filed for this  visit.               Wound Therapy - 09/27/21 1641     Subjective pt without any issues; wife states it has been itching more    Patient and Family Stated Goals wound to heal    Date of Onset 09/06/20    Prior Treatments MD and self care    Pain Scale 0-10    Pain Score 0-No pain    Evaluation and Treatment Procedures Explained to Patient/Family Yes    Evaluation and Treatment Procedures agreed to    Wound Properties Date First Assessed: 04/26/21 Time First Assessed: 4081 Wound Type: Non-pressure wound Location: Leg Location Orientation: Right Wound Description (Comments): superior to original wound Present on Admission: No   Wound Image Images linked: 1    Dressing Type Silver hydrofiber    Dressing Changed Changed    Dressing Status Old drainage    Dressing Change Frequency PRN    Site / Wound Assessment Yellow    % Wound base Red or Granulating 15%    % Wound base Yellow/Fibrinous Exudate 85%  Peri-wound Assessment Erythema (blanchable)    Wound Length (cm) 2 cm    Wound Width (cm) 1.7 cm    Wound Depth (cm) 0.2 cm    Wound Volume (cm^3) 0.68 cm^3    Wound Surface Area (cm^2) 3.4 cm^2    Margins Attached edges (approximated)    Drainage Amount Minimal    Drainage Description Serosanguineous    Treatment Cleansed;Debridement (Selective)    Wound Properties Date First Assessed: 12/04/20 Time First Assessed: 1545 Wound Type: Venous stasis ulcer Location: Leg Location Orientation: Right Present on Admission: Yes   Wound Image Images linked: 1    Dressing Type Silver hydrofiber    Dressing Changed Changed    Dressing Status Old drainage    Dressing Change Frequency PRN    Site / Wound Assessment Yellow;Pink    % Wound base Red or Granulating 30%    % Wound base Yellow/Fibrinous Exudate 70%    Peri-wound Assessment Erythema (blanchable)    Wound Length (cm) 5 cm    Wound Width (cm) 1.8 cm    Wound Depth (cm) 0.4 cm    Wound Volume (cm^3) 3.6 cm^3    Wound  Surface Area (cm^2) 9 cm^2    Margins Unattached edges (unapproximated)    Drainage Amount Minimal    Drainage Description Serosanguineous    Treatment Cleansed;Debridement (Selective)    Selective Debridement - Location both wounds to patient's tolerance.    Selective Debridement - Tools Used Forceps;Scalpel    Selective Debridement - Tissue Removed slough    Wound Therapy - Clinical Statement wounds photographed and measured this session.  No odor present and less drainage from wounds.  Overall slight reduction in size and able to remove from slough from both wound beds this session.  Cleansed with wet gauze (not washcloth), applied antifungal followed by lotion.  Cotninued with profore lite and silver hydrofiber to woundbeds.    Wound Therapy - Functional Problem List gait and balance    Factors Delaying/Impairing Wound Healing Diabetes Mellitus;Vascular compromise;Altered sensation    Wound Therapy - Frequency 2X / week    Wound Plan continue to monitor if wounds are needing dressing change or oral antibiotics change/etch.  Pt is no a low dose antibiotic currently.  Measure and photograph each week.    Dressing  cleanse with wet gauze, antifungal to raw areas, lotion, silver hydrofiber to wounds, 4X4, ABD, profore lite, #5 netting                       PT Short Term Goals - 07/12/21 1627       PT SHORT TERM GOAL #1   Title Patient will have at least 50% granualtion tissue.    Status Partially Met               PT Long Term Goals - 07/12/21 1627       PT LONG TERM GOAL #1   Title Patient's wound will be healed to reduce the risk of infection.    Status On-going      PT LONG TERM GOAL #2   Title Patient will be able to verbalize completion of daily skin checks to reduce the risk of further wound development.    Status On-going                    Patient will benefit from skilled therapeutic intervention in order to improve the following deficits  and impairments:  Visit Diagnosis: Other abnormalities of gait and mobility  Ulcer of right pretibial region, limited to breakdown of skin [facility])     Problem List Patient Active Problem List   Diagnosis Date Noted   Advanced nonexudative age-related macular degeneration of right eye without subfoveal involvement 11/15/2020   Advanced nonexudative age-related macular degeneration of left eye without subfoveal involvement 11/15/2020   Diabetes mellitus without complication ([place]) 94/04/6807   Posterior vitreous detachment of both eyes 11/15/2020   Gait disturbance, post-stroke 11/02/2017   Aphasia as late effect of cerebrovascular accident 11/02/2017   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   Aphasia    PAF (paroxysmal atrial fibrillation) ([place])    Diabetes mellitus type 2 in obese ([place])    Benign essential HTN    Hypothyroidism    Hyperlipidemia    Stroke (cerebrum) ([place]) 10/02/2017   Afib ([place]) 01/10/2017   SOB (shortness of breath) 81/07/3158   Acute diastolic CHF (congestive heart failure) ([place]) 01/10/2017   Atrial fibrillation with RVR ([place]) 01/10/2017   Claudication ([place]) 07/25/2016   Paroxysmal atrial fibrillation ([place]) 07/25/2016   Odynophagia 04/10/2014   Allergic rhinitis 03/23/2014   Chronic rhinitis 12/22/2013   Intrinsic asthma 07/15/2013   Constipation 08/22/2011   Obesity 09/20/2009   G E R D 11/29/2007   Celiac disease 11/29/2007   Teena Irani, PTA/CLT, WTA [phone]  Teena Irani, PTA 09/27/2021, 4:51 PM  [place], [place], [address] Phone: [phone]   Fax:  [phone]  Name: [name] MRN: [id number] Date of Birth: [birth year]

## 2021-10-01 ENCOUNTER — Other Ambulatory Visit: Payer: Self-pay

## 2021-10-01 ENCOUNTER — Ambulatory Visit (HOSPITAL_COMMUNITY): Payer: PPO | Admitting: Physical Therapy

## 2021-10-01 DIAGNOSIS — R2689 Other abnormalities of gait and mobility: Secondary | ICD-10-CM | POA: Diagnosis not present

## 2021-10-01 DIAGNOSIS — L97811 Non-pressure chronic ulcer of other part of right lower leg limited to breakdown of skin: Secondary | ICD-10-CM

## 2021-10-01 NOTE — Therapy (Addendum)
[place], [place], [address] Phone: [phone]   Fax:  [phone]  Wound Care Therapy Progress Note Reporting Period 09/20/2021 to 10/01/2021  See note below for Objective Data and Assessment of Progress/Goals.     Patient Details  Name: [name] MRN: [id number] Date of Birth: [birth year] Referring Provider (PT): [name]   Encounter Date: 10/01/2021   PT End of Session - 10/01/21 1655     Visit Number 96    Number of Visits 106    Date for PT Re-Evaluation 11/12/21    Authorization Type [employer] (no auth req, no visit limit)    Progress Note Due on Visit 106    PT Start Time 1500    PT Stop Time 1540    PT Time Calculation (min) 40 min    Activity Tolerance Patient tolerated treatment well    Behavior During Therapy [facility] for tasks assessed/performed             Past Medical History:  Diagnosis Date   Allergic rhinitis    Aortic stenosis    Asthmatic bronchitis    Celiac disease    Cellulitis    CKD (chronic kidney disease)    Diverticulosis    Fx. left wrist 1987   Gastric polyps    GERD (gastroesophageal reflux disease)    History of pneumonia    Hypertension    Hypothyroidism    Iron deficiency anemia    Melanoma ([place])    Neuropathy    PAF (paroxysmal atrial fibrillation) ([facility])    Prostate cancer ([place])    REM sleep behavior disorder    RLS (restless legs syndrome)    Sleep apnea    Spasticity    Stroke ([place]) 09/2017   Tremor    Type 2 diabetes mellitus ([place])     Past Surgical History:  Procedure Laterality Date   APPENDECTOMY  1978   CARPAL TUNNEL RELEASE Left    CATARACT EXTRACTION  03/2011   bilateral    CHOLECYSTECTOMY  2001   COLONOSCOPY W/ BIOPSIES  04/27/2008   diverticulosis, duodenitis   FOOT SURGERY     HERNIA REPAIR  1994   bilateral   KNEE SURGERY Bilateral    x 2   LUNG SURGERY  2001   for infection   PROSTATECTOMY  2002   UPPER  GASTROINTESTINAL ENDOSCOPY  04/27/2008   celiac disease, gastric polyps    There were no vitals filed for this visit.               Wound Therapy - 10/01/21 1647     Subjective wife present for treatment today.  Reports no issues.    Patient and Family Stated Goals wound to heal    Date of Onset 09/06/20    Prior Treatments MD and self care    Pain Scale 0-10    Pain Score 0-No pain    Evaluation and Treatment Procedures Explained to Patient/Family Yes    Evaluation and Treatment Procedures agreed to    Wound Properties Date First Assessed: 04/26/21 Time First Assessed: 2423 Wound Type: Non-pressure wound Location: Leg Location Orientation: Right Wound Description (Comments): superior to original wound Present on Admission: No   Wound Image Images linked: 1    Dressing Type Santyl    Dressing Changed Changed    Dressing Status Old drainage    Dressing Change Frequency PRN    Site / Wound Assessment Yellow    %  Wound base Red or Granulating 15%  was 10% granulated    % Wound base Yellow/Fibrinous Exudate 85%    Peri-wound Assessment Erythema (blanchable)    Wound Length (cm) 2 cm  was 2   Wound Width (cm) 1.7 cm  was 1.9   Wound Depth (cm) 0.2 cm  was  0.2 cm   Wound Volume (cm^3) 0.68 cm^3    Wound Surface Area (cm^2) 3.4 cm^2    Margins Attached edges (approximated)    Drainage Amount Minimal    Drainage Description Serosanguineous    Treatment Cleansed;Debridement (Selective)    Wound Properties Date First Assessed: 12/04/20 Time First Assessed: 1545 Wound Type: Venous stasis ulcer Location: Leg Location Orientation: Right Present on Admission: Yes   Wound Image Images linked: 1    Dressing Type Santyl    Dressing Changed Changed    Dressing Status Old drainage    Dressing Change Frequency PRN    Site / Wound Assessment Yellow;Pink    % Wound base Red or Granulating 30%  was 30   % Wound base Yellow/Fibrinous Exudate 70%    Peri-wound Assessment Erythema  (blanchable)    Wound Length (cm) 4.8 cm  was 5.3   Wound Width (cm) 1.8 cm  was 2.0   Wound Depth (cm) 0.3 cm  was .4   Wound Volume (cm^3) 2.59 cm^3    Wound Surface Area (cm^2) 8.64 cm^2    Margins Unattached edges (unapproximated)    Drainage Amount Minimal    Drainage Description Serosanguineous    Treatment Cleansed;Debridement (Selective)    Selective Debridement - Location both wounds to patient's tolerance.    Selective Debridement - Tools Used Forceps;Scalpel    Selective Debridement - Tissue Removed slough    Wound Therapy - Clinical Statement wounds photographed and measured this session.  Slight odor present from drainage but without s/s of infection.  continues to present with less drainage and slight reduction in size of most distal wound. Cleansed with wet gauze (not washcloth), applied lotion.  Resumed santyl and Cotninued with profore lite.  discussed with evaluating therapist, patient and spouse regarding trial of reduction to 1X week to see if wound responds better to less frequency.  To trial this next week (return this Friday then skip Tuesday and return Friday) if wounds appear to be okay next session.    Wound Therapy - Functional Problem List gait and balance    Factors Delaying/Impairing Wound Healing Diabetes Mellitus;Vascular compromise;Altered sensation    Wound Therapy - Frequency 6X / week    Wound Plan continue to monitor if wounds are needing dressing change or oral antibiotics change/etch.  Pt is no a low dose antibiotic currently.  Measure and photograph each week.    Dressing  cleanse with wet gauze, lotion, santyl to wounds, 4X4, ABD, profore lite, #5 netting                Continue treatment 2x a week for the next 6 weeks.        PT Short Term Goals - 07/12/21 1627       PT SHORT TERM GOAL #1   Title Patient will have at least 50% granualtion tissue.    Status Partially Met               PT Long Term Goals - 07/12/21 1627        PT LONG TERM GOAL #1   Title Patient's wound will be healed to reduce the risk  of infection.    Status On-going      PT LONG TERM GOAL #2   Title Patient will be able to verbalize completion of daily skin checks to reduce the risk of further wound development.    Status On-going                    Patient will benefit from skilled therapeutic intervention in order to improve the following deficits and impairments:     Visit Diagnosis: Other abnormalities of gait and mobility  Ulcer of right pretibial region, limited to breakdown of skin [facility])     Problem List Patient Active Problem List   Diagnosis Date Noted   Advanced nonexudative age-related macular degeneration of right eye without subfoveal involvement 11/15/2020   Advanced nonexudative age-related macular degeneration of left eye without subfoveal involvement 11/15/2020   Diabetes mellitus without complication ([place]) 42/55/2589   Posterior vitreous detachment of both eyes 11/15/2020   Gait disturbance, post-stroke 11/02/2017   Aphasia as late effect of cerebrovascular accident 11/02/2017   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   Aphasia    PAF (paroxysmal atrial fibrillation) ([place])    Diabetes mellitus type 2 in obese ([place])    Benign essential HTN    Hypothyroidism    Hyperlipidemia    Stroke (cerebrum) ([place]) 10/02/2017   Afib ([place]) 01/10/2017   SOB (shortness of breath) 48/34/7583   Acute diastolic CHF (congestive heart failure) ([place]) 01/10/2017   Atrial fibrillation with RVR ([place]) 01/10/2017   Claudication ([place]) 07/25/2016   Paroxysmal atrial fibrillation ([place]) 07/25/2016   Odynophagia 04/10/2014   Allergic rhinitis 03/23/2014   Chronic rhinitis 12/22/2013   Intrinsic asthma 07/15/2013   Constipation 08/22/2011   Obesity 09/20/2009   G E R D 11/29/2007   Celiac disease 11/29/2007   Teena Irani, PTA/CLT, WTA [place], PT CLT [phone]  10/01/2021, 5:05  PM  [place] [address] [place], [place], [address] Phone: [phone]   Fax:  [phone]  Name: EDWING FIGLEY MRN: [id number] Date of Birth: Jun 12, 1929

## 2021-10-02 NOTE — Addendum Note (Signed)
Addended by: Leeroy Cha on: 10/02/2021 09:55 AM   Modules accepted: Orders

## 2021-10-04 ENCOUNTER — Other Ambulatory Visit: Payer: Self-pay

## 2021-10-04 ENCOUNTER — Ambulatory Visit (HOSPITAL_COMMUNITY): Payer: PPO

## 2021-10-04 ENCOUNTER — Encounter (HOSPITAL_COMMUNITY): Payer: Self-pay

## 2021-10-04 DIAGNOSIS — R2689 Other abnormalities of gait and mobility: Secondary | ICD-10-CM | POA: Diagnosis not present

## 2021-10-04 DIAGNOSIS — L97811 Non-pressure chronic ulcer of other part of right lower leg limited to breakdown of skin: Secondary | ICD-10-CM

## 2021-10-04 NOTE — Therapy (Signed)
[place], [place], [address] Phone: [phone]   Fax:  [phone]  Wound Care Therapy  Patient Details  Name: [name] MRN: [id number] Date of Birth: [birth year] Referring Provider (PT): [name]   Encounter Date: 10/04/2021   PT End of Session - 10/04/21 1533     Visit Number 97    Number of Visits 106    Date for PT Re-Evaluation 11/12/21    Authorization Type [employer] (no auth req, no visit limit)    Progress Note Due on Visit 106    PT Start Time 3329    PT Stop Time 1530    PT Time Calculation (min) 39 min    Activity Tolerance Patient tolerated treatment well    Behavior During Therapy [facility] for tasks assessed/performed             Past Medical History:  Diagnosis Date   Allergic rhinitis    Aortic stenosis    Asthmatic bronchitis    Celiac disease    Cellulitis    CKD (chronic kidney disease)    Diverticulosis    Fx. left wrist 1987   Gastric polyps    GERD (gastroesophageal reflux disease)    History of pneumonia    Hypertension    Hypothyroidism    Iron deficiency anemia    Melanoma ([place])    Neuropathy    PAF (paroxysmal atrial fibrillation) ([place])    Prostate cancer ([place])    REM sleep behavior disorder    RLS (restless legs syndrome)    Sleep apnea    Spasticity    Stroke ([place]) 09/2017   Tremor    Type 2 diabetes mellitus ([place])     Past Surgical History:  Procedure Laterality Date   APPENDECTOMY  1978   CARPAL TUNNEL RELEASE Left    CATARACT EXTRACTION  03/2011   bilateral    CHOLECYSTECTOMY  2001   COLONOSCOPY W/ BIOPSIES  04/27/2008   diverticulosis, duodenitis   FOOT SURGERY     HERNIA REPAIR  1994   bilateral   KNEE SURGERY Bilateral    x 2   LUNG SURGERY  2001   for infection   PROSTATECTOMY  2002   UPPER GASTROINTESTINAL ENDOSCOPY  04/27/2008   celiac disease, gastric polyps    There were no vitals filed for this visit.    Subjective  Assessment - 10/04/21 1533     Subjective Reports of comfort, no pain except at night.                       Wound Therapy - 10/04/21 0001     Subjective Reports of comfort, no pain except at night.    Patient and Family Stated Goals wound to heal    Date of Onset 09/06/20    Prior Treatments MD and self care    Pain Scale 0-10    Pain Score 0-No pain    Evaluation and Treatment Procedures Explained to Patient/Family Yes    Evaluation and Treatment Procedures agreed to    Wound Properties Date First Assessed: 04/26/21 Time First Assessed: 5188 Wound Type: Non-pressure wound Location: Leg Location Orientation: Right Wound Description (Comments): superior to original wound Present on Admission: No   Dressing Type Santyl;Compression wrap    Dressing Changed Changed    Dressing Status Old drainage    Dressing Change Frequency PRN    Site / Wound Assessment Yellow    %  Wound base Red or Granulating 15%    % Wound base Yellow/Fibrinous Exudate 85%    Peri-wound Assessment Erythema (blanchable)    Margins Attached edges (approximated)    Drainage Amount Minimal    Drainage Description Serosanguineous    Treatment Cleansed;Debridement (Selective)    Wound Properties Date First Assessed: 12/04/20 Time First Assessed: 1545 Wound Type: Venous stasis ulcer Location: Leg Location Orientation: Right Present on Admission: Yes   Dressing Type Santyl;Compression wrap    Dressing Changed Changed    Dressing Status Old drainage    Dressing Change Frequency PRN    Site / Wound Assessment Pink;Yellow    % Wound base Red or Granulating 30%    % Wound base Yellow/Fibrinous Exudate 70%    Peri-wound Assessment Erythema (blanchable)    Margins Unattached edges (unapproximated)    Drainage Amount Minimal    Drainage Description Serosanguineous    Treatment Cleansed;Debridement (Selective)    Selective Debridement - Location both wounds to patient's tolerance.    Selective Debridement -  Tools Used Forceps;Scalpel    Selective Debridement - Tissue Removed slough    Wound Therapy - Clinical Statement Dry wound bed and skin surrounding wound.  Selective debridement for removal of adherent slough in wound bed wiht use of forceps and scalpel.  Continued wiht santyl to address adherent slough and profore lite for edema control. Applied heavy layer of vaseline/lotion for skin integrity.  Reports of comfort at EOS.    Wound Therapy - Functional Problem List gait and balance    Factors Delaying/Impairing Wound Healing Diabetes Mellitus;Vascular compromise;Altered sensation    Wound Therapy - Frequency 6X / week   Reduce to 1x/week   Wound Plan Trial with 1x/week and reassess next Friday.  continue to monitor if wounds are needing dressing change or oral antibiotics change/etch.  Pt is no a low dose antibiotic currently.  Measure and photograph each week.    Dressing  cleanse with wet gauze, lotion, santyl to wounds, 4X4, ABD, profore lite, #5 netting                       PT Short Term Goals - 07/12/21 1627       PT SHORT TERM GOAL #1   Title Patient will have at least 50% granualtion tissue.    Status Partially Met               PT Long Term Goals - 07/12/21 1627       PT LONG TERM GOAL #1   Title Patient's wound will be healed to reduce the risk of infection.    Status On-going      PT LONG TERM GOAL #2   Title Patient will be able to verbalize completion of daily skin checks to reduce the risk of further wound development.    Status On-going                    Patient will benefit from skilled therapeutic intervention in order to improve the following deficits and impairments:     Visit Diagnosis: Other abnormalities of gait and mobility  Ulcer of right pretibial region, limited to breakdown of skin [facility] [place])     Problem List Patient Active Problem List   Diagnosis Date Noted   Advanced nonexudative age-related macular degeneration of  right eye without subfoveal involvement 11/15/2020   Advanced nonexudative age-related macular degeneration of left eye without subfoveal involvement 11/15/2020   Diabetes mellitus without  complication ([place]) 14/48/1856   Posterior vitreous detachment of both eyes 11/15/2020   Gait disturbance, post-stroke 11/02/2017   Aphasia as late effect of cerebrovascular accident 11/02/2017   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)    Diabetes mellitus type 2 in obese ([place])    Benign essential HTN    Hypothyroidism    Hyperlipidemia    Stroke (cerebrum) ([place]) 10/02/2017   Afib ([place]) 01/10/2017   SOB (shortness of breath) 31/49/7026   Acute diastolic CHF (congestive heart failure) ([place]) 01/10/2017   Atrial fibrillation with RVR ([place]) 01/10/2017   Claudication ([place]) 07/25/2016   Paroxysmal atrial fibrillation ([place]) 07/25/2016   Odynophagia 04/10/2014   Allergic rhinitis 03/23/2014   Chronic rhinitis 12/22/2013   Intrinsic asthma 07/15/2013   Constipation 08/22/2011   Obesity 09/20/2009   G E R D 11/29/2007   Celiac disease 11/29/2007   Ihor Austin, LPTA/CLT; CBIS [phone]  Aldona Lento, PTA 10/04/2021, 5:19 PM  [place], [place], [address] Phone: [phone]   Fax:  [phone]  Name: ADISON JERGER MRN: [id number] Date of Birth: [birth year]

## 2021-10-08 ENCOUNTER — Ambulatory Visit (HOSPITAL_COMMUNITY): Payer: PPO | Admitting: Physical Therapy

## 2021-10-09 ENCOUNTER — Other Ambulatory Visit: Payer: Self-pay | Admitting: Cardiology

## 2021-10-11 ENCOUNTER — Ambulatory Visit (HOSPITAL_COMMUNITY): Payer: PPO | Admitting: Physical Therapy

## 2021-10-11 ENCOUNTER — Other Ambulatory Visit: Payer: Self-pay

## 2021-10-11 DIAGNOSIS — E782 Mixed hyperlipidemia: Secondary | ICD-10-CM | POA: Diagnosis not present

## 2021-10-11 DIAGNOSIS — L97811 Non-pressure chronic ulcer of other part of right lower leg limited to breakdown of skin: Secondary | ICD-10-CM

## 2021-10-11 DIAGNOSIS — R2689 Other abnormalities of gait and mobility: Secondary | ICD-10-CM

## 2021-10-11 DIAGNOSIS — I10 Essential (primary) hypertension: Secondary | ICD-10-CM | POA: Diagnosis not present

## 2021-10-11 NOTE — Therapy (Signed)
[place], [place], [address] Phone: [phone]   Fax:  [phone]  Wound Care Therapy  Patient Details  Name: [name] MRN: [id number] Date of Birth: [birth year] Referring Provider (PT): [name]   Encounter Date: 10/11/2021   PT End of Session - 10/11/21 1532     Visit Number 98    Number of Visits 106    Date for PT Re-Evaluation 11/12/21    Authorization Type [employer] (no auth req, no visit limit)    Progress Note Due on Visit 106    PT Start Time 7494    PT Stop Time 1520    PT Time Calculation (min) 35 min    Activity Tolerance Patient tolerated treatment well    Behavior During Therapy [facility] for tasks assessed/performed             Past Medical History:  Diagnosis Date   Allergic rhinitis    Aortic stenosis    Asthmatic bronchitis    Celiac disease    Cellulitis    CKD (chronic kidney disease)    Diverticulosis    Fx. left wrist 1987   Gastric polyps    GERD (gastroesophageal reflux disease)    History of pneumonia    Hypertension    Hypothyroidism    Iron deficiency anemia    Melanoma ([place])    Neuropathy    PAF (paroxysmal atrial fibrillation) ([place])    Prostate cancer ([address])    REM sleep behavior disorder    RLS (restless legs syndrome)    Sleep apnea    Spasticity    Stroke ([place]) 09/2017   Tremor    Type 2 diabetes mellitus ([place])     Past Surgical History:  Procedure Laterality Date   APPENDECTOMY  1978   CARPAL TUNNEL RELEASE Left    CATARACT EXTRACTION  03/2011   bilateral    CHOLECYSTECTOMY  2001   COLONOSCOPY W/ BIOPSIES  04/27/2008   diverticulosis, duodenitis   FOOT SURGERY     HERNIA REPAIR  1994   bilateral   KNEE SURGERY Bilateral    x 2   LUNG SURGERY  2001   for infection   PROSTATECTOMY  2002   UPPER GASTROINTESTINAL ENDOSCOPY  04/27/2008   celiac disease, gastric polyps    There were no vitals filed for this  visit.               Wound Therapy - 10/11/21 0001     Subjective Pt states that the dressing was comfortable until last night.    Patient and Family Stated Goals wound to heal    Date of Onset 09/06/20    Prior Treatments MD and self care    Evaluation and Treatment Procedures Explained to Patient/Family Yes    Evaluation and Treatment Procedures agreed to    Wound Properties Date First Assessed: 04/26/21 Time First Assessed: 4967 Wound Type: Non-pressure wound Location: Leg Location Orientation: Right Wound Description (Comments): superior to original wound Present on Admission: No   Dressing Type Bismuth petroleum;Santyl;Compression wrap    Dressing Changed Changed    Dressing Status Old drainage    Dressing Change Frequency PRN    Site / Wound Assessment Yellow    % Wound base Red or Granulating 5%    % Wound base Yellow/Fibrinous Exudate 95%    Peri-wound Assessment Erythema (blanchable)    Wound Length (cm) 2.2 cm    Wound Width (cm)  2 cm    Wound Surface Area (cm^2) 4.4 cm^2    Margins Attached edges (approximated)    Drainage Amount Minimal    Drainage Description Serous    Treatment Cleansed;Debridement (Selective)    Wound Properties Date First Assessed: 12/04/20 Time First Assessed: 2694 Wound Type: Venous stasis ulcer Location: Leg Location Orientation: Right Present on Admission: Yes   Dressing Type Santyl;Compression wrap    Dressing Changed Changed    Dressing Status Old drainage    Site / Wound Assessment Pink;Yellow    % Wound base Red or Granulating 30%    % Wound base Yellow/Fibrinous Exudate 70%    Peri-wound Assessment Erythema (blanchable)    Wound Length (cm) 4.5 cm    Wound Width (cm) 2.3 cm    Wound Depth (cm) 0.3 cm    Wound Volume (cm^3) 3.11 cm^3    Wound Surface Area (cm^2) 10.35 cm^2    Margins Attached edges (approximated)    Drainage Amount Minimal    Drainage Description Serous    Treatment Cleansed;Debridement (Selective)     Selective Debridement - Location both wound beds, pt unable to tolerate debridement of most superior    Selective Debridement - Tools Used Forceps;Scalpel    Selective Debridement - Tissue Removed slough    Wound Therapy - Clinical Statement Pt LE red and dry used vaseline followed by xeroform to area around wounds.  Santy 2x2 and gauze to wound beds themselves followed by application of profore lite for compression.  Will assess next week and if wounds are increasing in size we will return to 2x a week instead of weekly    Wound Therapy - Functional Problem List gait and balance    Factors Delaying/Impairing Wound Healing Diabetes Mellitus;Vascular compromise;Altered sensation    Wound Therapy - Frequency 6X / week   Reduce to 1x/week   Wound Plan Trial with 1x/week and reassess next Friday.  continue to monitor if wounds are needing dressing change or oral antibiotics change/etch.  Pt is no a low dose antibiotic currently.  Measure and photograph each week.    Dressing  cleanse with wet gauze, lotion, santyl to wounds, 4X4,xeroform to area around wounds , profore lite, #5 netting                       PT Short Term Goals - 07/12/21 1627       PT SHORT TERM GOAL #1   Title Patient will have at least 50% granualtion tissue.    Status Partially Met               PT Long Term Goals - 07/12/21 1627       PT LONG TERM GOAL #1   Title Patient's wound will be healed to reduce the risk of infection.    Status On-going      PT LONG TERM GOAL #2   Title Patient will be able to verbalize completion of daily skin checks to reduce the risk of further wound development.    Status On-going                   Plan - 10/11/21 1532     Clinical Impression Statement see above    Personal Factors and Comorbidities Age;Comorbidity 3+;Past/Current Experience;Fitness;Time since onset of injury/illness/exacerbation    Comorbidities DM, CKD, neuorpathy, Hx CVA     Examination-Activity Limitations Locomotion Level;Transfers;Squat;Stand;Dressing    Examination-Participation Restrictions Community Activity    Stability/Clinical Decision  Making Stable/Uncomplicated    Rehab Potential Good    PT Frequency 2x / week    PT Duration 6 weeks    PT Treatment/Interventions Gait training;Stair training;Functional mobility training;DME Instruction;Therapeutic activities;Therapeutic exercise;Balance training;Neuromuscular re-education;Patient/family education;Compression bandaging;Manual lymph drainage;Manual techniques    PT Next Visit Plan Continue with both chemical and mechanical debridement to remove adherent eschar to allow healing to occur    Consulted and Agree with Plan of Care Patient;Family member/caregiver    Family Member Consulted wife             Patient will benefit from skilled therapeutic intervention in order to improve the following deficits and impairments:  Abnormal gait, Decreased skin integrity, Decreased mobility, Impaired sensation  Visit Diagnosis: Other abnormalities of gait and mobility  Ulcer of right pretibial region, limited to breakdown of skin [facility])     Problem List Patient Active Problem List   Diagnosis Date Noted   Advanced nonexudative age-related macular degeneration of right eye without subfoveal involvement 11/15/2020   Advanced nonexudative age-related macular degeneration of left eye without subfoveal involvement 11/15/2020   Diabetes mellitus without complication ([place]) 45/36/4680   Posterior vitreous detachment of both eyes 11/15/2020   Gait disturbance, post-stroke 11/02/2017   Aphasia as late effect of cerebrovascular accident 11/02/2017   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   Aphasia    PAF (paroxysmal atrial fibrillation) ([place])    Diabetes mellitus type 2 in obese ([place])    Benign essential HTN    Hypothyroidism    Hyperlipidemia    Stroke (cerebrum) ([place]) 10/02/2017   Afib ([place])  01/10/2017   SOB (shortness of breath) 32/09/2481   Acute diastolic CHF (congestive heart failure) ([place]) 01/10/2017   Atrial fibrillation with RVR ([place]) 01/10/2017   Claudication ([place]) 07/25/2016   Paroxysmal atrial fibrillation ([place]) 07/25/2016   Odynophagia 04/10/2014   Allergic rhinitis 03/23/2014   Chronic rhinitis 12/22/2013   Intrinsic asthma 07/15/2013   Constipation 08/22/2011   Obesity 09/20/2009   G E R D 11/29/2007   Celiac disease 11/29/2007   Rayetta Humphrey, PT CLT [phone]  10/11/2021, 3:33 PM  [place], [place], [address] Phone: [phone]   Fax:  [phone]  Name: [name] MRN: [id number] Date of Birth: 08-Aug-1929

## 2021-10-15 ENCOUNTER — Ambulatory Visit (HOSPITAL_COMMUNITY): Payer: PPO | Admitting: Physical Therapy

## 2021-10-17 DIAGNOSIS — D638 Anemia in other chronic diseases classified elsewhere: Secondary | ICD-10-CM | POA: Diagnosis not present

## 2021-10-17 DIAGNOSIS — E119 Type 2 diabetes mellitus without complications: Secondary | ICD-10-CM | POA: Diagnosis not present

## 2021-10-17 DIAGNOSIS — R809 Proteinuria, unspecified: Secondary | ICD-10-CM | POA: Diagnosis not present

## 2021-10-17 DIAGNOSIS — E211 Secondary hyperparathyroidism, not elsewhere classified: Secondary | ICD-10-CM | POA: Diagnosis not present

## 2021-10-17 DIAGNOSIS — N189 Chronic kidney disease, unspecified: Secondary | ICD-10-CM | POA: Diagnosis not present

## 2021-10-17 DIAGNOSIS — E1122 Type 2 diabetes mellitus with diabetic chronic kidney disease: Secondary | ICD-10-CM | POA: Diagnosis not present

## 2021-10-17 DIAGNOSIS — E1129 Type 2 diabetes mellitus with other diabetic kidney complication: Secondary | ICD-10-CM | POA: Diagnosis not present

## 2021-10-17 DIAGNOSIS — D508 Other iron deficiency anemias: Secondary | ICD-10-CM | POA: Diagnosis not present

## 2021-10-17 DIAGNOSIS — I129 Hypertensive chronic kidney disease with stage 1 through stage 4 chronic kidney disease, or unspecified chronic kidney disease: Secondary | ICD-10-CM | POA: Diagnosis not present

## 2021-10-18 ENCOUNTER — Ambulatory Visit (HOSPITAL_COMMUNITY): Payer: PPO | Admitting: Physical Therapy

## 2021-10-18 ENCOUNTER — Encounter (HOSPITAL_COMMUNITY): Payer: Self-pay

## 2021-10-18 ENCOUNTER — Other Ambulatory Visit: Payer: Self-pay

## 2021-10-18 ENCOUNTER — Telehealth (HOSPITAL_COMMUNITY): Payer: Self-pay

## 2021-10-18 ENCOUNTER — Ambulatory Visit (HOSPITAL_COMMUNITY): Payer: PPO | Attending: Internal Medicine

## 2021-10-18 DIAGNOSIS — L97811 Non-pressure chronic ulcer of other part of right lower leg limited to breakdown of skin: Secondary | ICD-10-CM | POA: Diagnosis not present

## 2021-10-18 DIAGNOSIS — R2689 Other abnormalities of gait and mobility: Secondary | ICD-10-CM | POA: Diagnosis not present

## 2021-10-18 NOTE — Telephone Encounter (Signed)
No show, called with no answer, left message concerning missed apt today and reminded next apt date and time wiht contact information included for return call.    Ihor Austin, LPTA/CLT; Delana Meyer [phone]

## 2021-10-18 NOTE — Therapy (Addendum)
[place], [place], [address] Phone: [phone]   Fax:  [phone]  Wound Care Therapy  Patient Details  Name: [name] MRN: [id number] Date of Birth: [birth year] Referring Provider (PT): [name]   Encounter Date: 10/18/2021   PT End of Session - 10/18/21 1452     Visit Number 57    Number of Visits 106    Date for PT Re-Evaluation 11/12/21    Authorization Type [employer] (no auth req, no visit limit)    Progress Note Due on Visit 106    PT Start Time 3664    PT Stop Time 1408    PT Time Calculation (min) 33 min    Activity Tolerance Patient tolerated treatment well    Behavior During Therapy [facility] for tasks assessed/performed             Past Medical History:  Diagnosis Date   Allergic rhinitis    Aortic stenosis    Asthmatic bronchitis    Celiac disease    Cellulitis    CKD (chronic kidney disease)    Diverticulosis    Fx. left wrist 1987   Gastric polyps    GERD (gastroesophageal reflux disease)    History of pneumonia    Hypertension    Hypothyroidism    Iron deficiency anemia    Melanoma ([place])    Neuropathy    PAF (paroxysmal atrial fibrillation) ([place])    Prostate cancer ([place])    REM sleep behavior disorder    RLS (restless legs syndrome)    Sleep apnea    Spasticity    Stroke ([place]) 09/2017   Tremor    Type 2 diabetes mellitus ([place])     Past Surgical History:  Procedure Laterality Date   APPENDECTOMY  1978   CARPAL TUNNEL RELEASE Left    CATARACT EXTRACTION  03/2011   bilateral    CHOLECYSTECTOMY  2001   COLONOSCOPY W/ BIOPSIES  04/27/2008   diverticulosis, duodenitis   FOOT SURGERY     HERNIA REPAIR  1994   bilateral   KNEE SURGERY Bilateral    x 2   LUNG SURGERY  2001   for infection   PROSTATECTOMY  2002   UPPER GASTROINTESTINAL ENDOSCOPY  04/27/2008   celiac disease, gastric polyps    There were no vitals filed for this visit.    Subjective  Assessment - 10/18/21 1447     Subjective Pt late for apt.  Arrived dressings intact, no reports of pain today.    Currently in Pain? No/denies                       Wound Therapy - 10/18/21 0001     Subjective Pt late for apt.  Arrived dressings intact, no reports of pain today.    Patient and Family Stated Goals wound to heal    Date of Onset 09/06/20    Prior Treatments MD and self care    Pain Scale 0-10    Evaluation and Treatment Procedures Explained to Patient/Family Yes    Evaluation and Treatment Procedures agreed to    Wound Properties Date First Assessed: 04/26/21 Time First Assessed: 4034 Wound Type: Non-pressure wound Location: Leg Location Orientation: Right Wound Description (Comments): superior to original wound Present on Admission: No   Wound Image Images linked: 1    Dressing Type Santyl;Compression wrap   Santyl, 2x2, profore lite   Dressing Changed Changed  Dressing Status Old drainage    Dressing Change Frequency PRN    Site / Wound Assessment Yellow    % Wound base Red or Granulating 5%    % Wound base Yellow/Fibrinous Exudate 95%    Peri-wound Assessment Erythema (blanchable)    Wound Length (cm) 2.3 cm    Wound Width (cm) 2 cm    Wound Depth (cm) 0.2 cm    Wound Volume (cm^3) 0.92 cm^3    Wound Surface Area (cm^2) 4.6 cm^2    Margins Attached edges (approximated)    Drainage Amount Minimal    Drainage Description Serous    Treatment Cleansed;Debridement (Selective)    Wound Properties Date First Assessed: 12/04/20 Time First Assessed: 1545 Wound Type: Venous stasis ulcer Location: Leg Location Orientation: Right Present on Admission: Yes   Wound Image Images linked: 1    Dressing Type Santyl;Compression wrap   Pt late for apt.  Arrived dressings intact, no reports of pain today.   Dressing Changed Changed    Dressing Status Old drainage    Site / Wound Assessment Yellow;Pink    % Wound base Red or Granulating 20%    % Wound base  Yellow/Fibrinous Exudate 80%    Peri-wound Assessment Erythema (blanchable)    Wound Length (cm) 4.5 cm    Wound Width (cm) 2.1 cm    Wound Depth (cm) 0.3 cm    Wound Volume (cm^3) 2.84 cm^3    Wound Surface Area (cm^2) 9.45 cm^2    Margins Attached edges (approximated)    Drainage Amount Minimal    Drainage Description Serous    Treatment Cleansed;Debridement (Selective)    Selective Debridement - Location both wound beds, pt unable to tolerate debridement of most superior    Selective Debridement - Tools Used Forceps;Scalpel    Selective Debridement - Tissue Removed slough    Wound Therapy - Clinical Statement Pt arrived with increased dry skin, redness perimeter and yellow slough adherent to wound bed.  Resumed 2x/week wiht Santyl and profore lite.  If no improvements next session, resume medihoney.    Wound Therapy - Functional Problem List gait and balance    Factors Delaying/Impairing Wound Healing Diabetes Mellitus;Vascular compromise;Altered sensation    Wound Therapy - Frequency 6X / week    Wound Plan Resume 2x/week    Dressing  cleanse with wet gauze, lotion, santyl to wounds, 4X4,xeroform to area around wounds , profore lite, #5 netting                       PT Short Term Goals - 07/12/21 1627       PT SHORT TERM GOAL #1   Title Patient will have at least 50% granualtion tissue.    Status Partially Met               PT Long Term Goals - 07/12/21 1627       PT LONG TERM GOAL #1   Title Patient's wound will be healed to reduce the risk of infection.    Status On-going      PT LONG TERM GOAL #2   Title Patient will be able to verbalize completion of daily skin checks to reduce the risk of further wound development.    Status On-going                    Patient will benefit from skilled therapeutic intervention in order to improve the following deficits and impairments:  Visit Diagnosis: Other abnormalities of gait and  mobility  Ulcer of right pretibial region, limited to breakdown of skin [facility])     Problem List Patient Active Problem List   Diagnosis Date Noted   Advanced nonexudative age-related macular degeneration of right eye without subfoveal involvement 11/15/2020   Advanced nonexudative age-related macular degeneration of left eye without subfoveal involvement 11/15/2020   Diabetes mellitus without complication ([place]) 84/66/5993   Posterior vitreous detachment of both eyes 11/15/2020   Gait disturbance, post-stroke 11/02/2017   Aphasia as late effect of cerebrovascular accident 11/02/2017   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   Aphasia    PAF (paroxysmal atrial fibrillation) ([place])    Diabetes mellitus type 2 in obese ([place])    Benign essential HTN    Hypothyroidism    Hyperlipidemia    Stroke (cerebrum) ([place]) 10/02/2017   Afib ([place]) 01/10/2017   SOB (shortness of breath) 57/10/7791   Acute diastolic CHF (congestive heart failure) ([place]) 01/10/2017   Atrial fibrillation with RVR ([place]) 01/10/2017   Claudication ([place]) 07/25/2016   Paroxysmal atrial fibrillation ([place]) 07/25/2016   Odynophagia 04/10/2014   Allergic rhinitis 03/23/2014   Chronic rhinitis 12/22/2013   Intrinsic asthma 07/15/2013   Constipation 08/22/2011   Obesity 09/20/2009   G E R D 11/29/2007   Celiac disease 11/29/2007   Ihor Austin, LPTA/CLT; CBIS [place], PT CLT [phone]  10/18/2021, 2:54 PM  [place] [address] [place], [place], [address] Phone: [phone]   Fax:  [phone]  Name: SEMAJ COBURN MRN: [id number] Date of Birth: 1928-11-04

## 2021-10-21 DIAGNOSIS — I69341 Monoplegia of lower limb following cerebral infarction affecting right dominant side: Secondary | ICD-10-CM | POA: Diagnosis not present

## 2021-10-21 DIAGNOSIS — D692 Other nonthrombocytopenic purpura: Secondary | ICD-10-CM | POA: Diagnosis not present

## 2021-10-21 DIAGNOSIS — E11622 Type 2 diabetes mellitus with other skin ulcer: Secondary | ICD-10-CM | POA: Diagnosis not present

## 2021-10-21 DIAGNOSIS — I4891 Unspecified atrial fibrillation: Secondary | ICD-10-CM | POA: Diagnosis not present

## 2021-10-21 DIAGNOSIS — Z6828 Body mass index (BMI) 28.0-28.9, adult: Secondary | ICD-10-CM | POA: Diagnosis not present

## 2021-10-21 DIAGNOSIS — E1122 Type 2 diabetes mellitus with diabetic chronic kidney disease: Secondary | ICD-10-CM | POA: Diagnosis not present

## 2021-10-21 DIAGNOSIS — I509 Heart failure, unspecified: Secondary | ICD-10-CM | POA: Diagnosis not present

## 2021-10-21 DIAGNOSIS — L97819 Non-pressure chronic ulcer of other part of right lower leg with unspecified severity: Secondary | ICD-10-CM | POA: Diagnosis not present

## 2021-10-21 DIAGNOSIS — I130 Hypertensive heart and chronic kidney disease with heart failure and stage 1 through stage 4 chronic kidney disease, or unspecified chronic kidney disease: Secondary | ICD-10-CM | POA: Diagnosis not present

## 2021-10-21 DIAGNOSIS — N183 Chronic kidney disease, stage 3 unspecified: Secondary | ICD-10-CM | POA: Diagnosis not present

## 2021-10-21 DIAGNOSIS — E785 Hyperlipidemia, unspecified: Secondary | ICD-10-CM | POA: Diagnosis not present

## 2021-10-21 DIAGNOSIS — Z7901 Long term (current) use of anticoagulants: Secondary | ICD-10-CM | POA: Diagnosis not present

## 2021-10-22 ENCOUNTER — Other Ambulatory Visit: Payer: Self-pay

## 2021-10-22 ENCOUNTER — Ambulatory Visit (HOSPITAL_COMMUNITY): Payer: PPO | Admitting: Physical Therapy

## 2021-10-22 DIAGNOSIS — L97811 Non-pressure chronic ulcer of other part of right lower leg limited to breakdown of skin: Secondary | ICD-10-CM

## 2021-10-22 DIAGNOSIS — R2689 Other abnormalities of gait and mobility: Secondary | ICD-10-CM | POA: Diagnosis not present

## 2021-10-22 NOTE — Therapy (Signed)
[place], [place], [address] Phone: [phone]   Fax:  [phone]  Wound Care Therapy  Patient Details  Name: [name] MRN: [id number] Date of Birth: 04/23/1929 Referring Provider (PT): [name]   Encounter Date: 10/22/2021   PT End of Session - 10/22/21 1449     Visit Number 100    Number of Visits 106    Date for PT Re-Evaluation 11/12/21    Authorization Type [employer] (no auth req, no visit limit)    Progress Note Due on Visit 106    PT Start Time 1450    PT Stop Time 1530    PT Time Calculation (min) 40 min    Activity Tolerance Patient tolerated treatment well    Behavior During Therapy [facility] for tasks assessed/performed             Past Medical History:  Diagnosis Date   Allergic rhinitis    Aortic stenosis    Asthmatic bronchitis    Celiac disease    Cellulitis    CKD (chronic kidney disease)    Diverticulosis    Fx. left wrist 1987   Gastric polyps    GERD (gastroesophageal reflux disease)    History of pneumonia    Hypertension    Hypothyroidism    Iron deficiency anemia    Melanoma ([place])    Neuropathy    PAF (paroxysmal atrial fibrillation) ([place])    Prostate cancer ([place])    REM sleep behavior disorder    RLS (restless legs syndrome)    Sleep apnea    Spasticity    Stroke ([place]) 09/2017   Tremor    Type 2 diabetes mellitus ([place])     Past Surgical History:  Procedure Laterality Date   APPENDECTOMY  1978   CARPAL TUNNEL RELEASE Left    CATARACT EXTRACTION  03/2011   bilateral    CHOLECYSTECTOMY  2001   COLONOSCOPY W/ BIOPSIES  04/27/2008   diverticulosis, duodenitis   FOOT SURGERY     HERNIA REPAIR  1994   bilateral   KNEE SURGERY Bilateral    x 2   LUNG SURGERY  2001   for infection   PROSTATECTOMY  2002   UPPER GASTROINTESTINAL ENDOSCOPY  04/27/2008   celiac disease, gastric polyps    There were no vitals filed for this  visit.               Wound Therapy - 10/22/21 0001     Subjective Pt has no complaints.    Patient and Family Stated Goals wound to heal    Date of Onset 09/06/20    Prior Treatments MD and self care    Pain Scale 0-10    Pain Score 0-No pain    Evaluation and Treatment Procedures Explained to Patient/Family Yes    Evaluation and Treatment Procedures agreed to    Wound Properties Date First Assessed: 04/26/21 Time First Assessed: 1410 Wound Type: Non-pressure wound Location: Leg Location Orientation: Right Wound Description (Comments): superior to original wound Present on Admission: No   Dressing Type Hydrogel;Santyl;Compression wrap    Dressing Changed Changed    Dressing Status Old drainage    Dressing Change Frequency PRN    Site / Wound Assessment Yellow    % Wound base Red or Granulating 10%    % Wound base Yellow/Fibrinous Exudate 90%    Peri-wound Assessment Erythema (blanchable)    Margins Attached edges (approximated)  Drainage Amount Minimal    Drainage Description Serous    Treatment Cleansed;Debridement (Selective)    Wound Properties Date First Assessed: 12/04/20 Time First Assessed: 3244 Wound Type: Venous stasis ulcer Location: Leg Location Orientation: Right Present on Admission: Yes   Dressing Type Compression wrap;Hydrogel;Santyl    Dressing Changed Changed    Dressing Status Old drainage    Site / Wound Assessment Pink;Yellow    % Wound base Red or Granulating 35%    % Wound base Yellow/Fibrinous Exudate 65%    Peri-wound Assessment Erythema (blanchable)    Wound Length (cm) 4.5 cm    Wound Width (cm) 1.8 cm    Wound Depth (cm) 0.3 cm    Wound Volume (cm^3) 2.43 cm^3    Wound Surface Area (cm^2) 8.1 cm^2    Drainage Amount Minimal    Drainage Description Serous    Treatment Cleansed;Debridement (Selective)    Selective Debridement - Location both wound beds, pt unable to tolerate debridement of most superior    Selective Debridement - Tools  Used Forceps;Scalpel    Selective Debridement - Tissue Removed slough    Wound Therapy - Clinical Statement Pt able to tolerate debridement on superior wound today.  Returned to thicker sockinette prior to applying profore compression secondary to red irritation to LE as a whole.    Wound Therapy - Functional Problem List gait and balance    Factors Delaying/Impairing Wound Healing Diabetes Mellitus;Vascular compromise;Altered sensation    Wound Therapy - Frequency 6X / week    Wound Plan Resume 2x/week    Dressing  cleanse with wet gauze, lotion, santyl to wounds, 2x2, hyfroge.thicker sockinette followed by  profore lite, #5 netting                       PT Short Term Goals - 07/12/21 1627       PT SHORT TERM GOAL #1   Title Patient will have at least 50% granualtion tissue.    Status Partially Met               PT Long Term Goals - 07/12/21 1627       PT LONG TERM GOAL #1   Title Patient's wound will be healed to reduce the risk of infection.    Status On-going      PT LONG TERM GOAL #2   Title Patient will be able to verbalize completion of daily skin checks to reduce the risk of further wound development.    Status On-going                   Plan - 10/22/21 1449     Clinical Impression Statement see above    Personal Factors and Comorbidities Age;Comorbidity 3+;Past/Current Experience;Fitness;Time since onset of injury/illness/exacerbation    Comorbidities DM, CKD, neuorpathy, Hx CVA    Examination-Activity Limitations Locomotion Level;Transfers;Squat;Stand;Dressing    Examination-Participation Restrictions Community Activity    Stability/Clinical Decision Making Stable/Uncomplicated    Rehab Potential Good    PT Frequency 2x / week    PT Duration 6 weeks    PT Treatment/Interventions Gait training;Stair training;Functional mobility training;DME Instruction;Therapeutic activities;Therapeutic exercise;Balance training;Neuromuscular  re-education;Patient/family education;Compression bandaging;Manual lymph drainage;Manual techniques    PT Next Visit Plan Continue with both chemical and mechanical debridement to remove adherent eschar to allow healing to occur    Consulted and Agree with Plan of Care Patient;Family member/caregiver    Family Member Consulted wife  Patient will benefit from skilled therapeutic intervention in order to improve the following deficits and impairments:  Abnormal gait, Decreased skin integrity, Decreased mobility, Impaired sensation  Visit Diagnosis: Other abnormalities of gait and mobility  Ulcer of right pretibial region, limited to breakdown of skin [facility])     Problem List Patient Active Problem List   Diagnosis Date Noted   Advanced nonexudative age-related macular degeneration of right eye without subfoveal involvement 11/15/2020   Advanced nonexudative age-related macular degeneration of left eye without subfoveal involvement 11/15/2020   Diabetes mellitus without complication ([place]) 40/81/4481   Posterior vitreous detachment of both eyes 11/15/2020   Gait disturbance, post-stroke 11/02/2017   Aphasia as late effect of cerebrovascular accident 11/02/2017   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   Aphasia    PAF (paroxysmal atrial fibrillation) ([place])    Diabetes mellitus type 2 in obese ([place])    Benign essential HTN    Hypothyroidism    Hyperlipidemia    Stroke (cerebrum) ([place]) 10/02/2017   Afib ([place]) 01/10/2017   SOB (shortness of breath) 85/63/1497   Acute diastolic CHF (congestive heart failure) ([place]) 01/10/2017   Atrial fibrillation with RVR ([place]) 01/10/2017   Claudication ([place]) 07/25/2016   Paroxysmal atrial fibrillation ([place]) 07/25/2016   Odynophagia 04/10/2014   Allergic rhinitis 03/23/2014   Chronic rhinitis 12/22/2013   Intrinsic asthma 07/15/2013   Constipation 08/22/2011   Obesity 09/20/2009   G E R D 11/29/2007   Celiac disease  11/29/2007  Rayetta Humphrey, PT CLT [phone]  10/22/2021, 3:37 PM  [place] [address] [place], [place], [address] Phone: [phone]   Fax:  [phone]  Name: [name] MRN: [id number] Date of Birth: [birth year]

## 2021-10-24 ENCOUNTER — Encounter (INDEPENDENT_AMBULATORY_CARE_PROVIDER_SITE_OTHER): Payer: PPO | Admitting: Ophthalmology

## 2021-10-25 ENCOUNTER — Other Ambulatory Visit: Payer: Self-pay

## 2021-10-25 ENCOUNTER — Ambulatory Visit (HOSPITAL_COMMUNITY): Payer: PPO | Admitting: Physical Therapy

## 2021-10-25 DIAGNOSIS — R2689 Other abnormalities of gait and mobility: Secondary | ICD-10-CM

## 2021-10-25 DIAGNOSIS — L97811 Non-pressure chronic ulcer of other part of right lower leg limited to breakdown of skin: Secondary | ICD-10-CM

## 2021-10-25 NOTE — Therapy (Signed)
[place], [place], [address] Phone: [phone]   Fax:  [phone]  Wound Care Therapy  Patient Details  Name: [name] MRN: [id number] Date of Birth: [birth year] Referring Provider (PT): [name]   Encounter Date: 10/25/2021   PT End of Session - 10/25/21 1254     Visit Number 102    Number of Visits 106    Date for PT Re-Evaluation 11/12/21    Authorization Type [employer] (no auth req, no visit limit)    Progress Note Due on Visit 106    PT Start Time 1138    PT Stop Time 1215    PT Time Calculation (min) 37 min    Activity Tolerance Patient tolerated treatment well    Behavior During Therapy [facility] for tasks assessed/performed             Past Medical History:  Diagnosis Date   Allergic rhinitis    Aortic stenosis    Asthmatic bronchitis    Celiac disease    Cellulitis    CKD (chronic kidney disease)    Diverticulosis    Fx. left wrist 1987   Gastric polyps    GERD (gastroesophageal reflux disease)    History of pneumonia    Hypertension    Hypothyroidism    Iron deficiency anemia    Melanoma ([place])    Neuropathy    PAF (paroxysmal atrial fibrillation) ([place])    Prostate cancer ([place])    REM sleep behavior disorder    RLS (restless legs syndrome)    Sleep apnea    Spasticity    Stroke ([place]) 09/2017   Tremor    Type 2 diabetes mellitus ([place])     Past Surgical History:  Procedure Laterality Date   APPENDECTOMY  1978   CARPAL TUNNEL RELEASE Left    CATARACT EXTRACTION  03/2011   bilateral    CHOLECYSTECTOMY  2001   COLONOSCOPY W/ BIOPSIES  04/27/2008   diverticulosis, duodenitis   FOOT SURGERY     HERNIA REPAIR  1994   bilateral   KNEE SURGERY Bilateral    x 2   LUNG SURGERY  2001   for infection   PROSTATECTOMY  2002   UPPER GASTROINTESTINAL ENDOSCOPY  04/27/2008   celiac disease, gastric polyps    There were no vitals filed for this  visit.               Wound Therapy - 10/25/21 1248     Subjective Pt has no complaints.    Patient and Family Stated Goals wound to heal    Date of Onset 09/06/20    Prior Treatments MD and self care    Pain Scale 0-10    Pain Score 0-No pain    Evaluation and Treatment Procedures Explained to Patient/Family Yes    Evaluation and Treatment Procedures agreed to    Wound Properties Date First Assessed: 04/26/21 Time First Assessed: 9311 Wound Type: Non-pressure wound Location: Leg Location Orientation: Right Wound Description (Comments): superior to original wound Present on Admission: No   Wound Image Images linked: 1    Dressing Type Hydrogel;Santyl;Compression wrap    Dressing Changed Changed    Dressing Status Old drainage    Dressing Change Frequency PRN    Site / Wound Assessment Yellow;Red    % Wound base Red or Granulating 10%    % Wound base Yellow/Fibrinous Exudate 90%    Peri-wound Assessment Erythema (blanchable)  Wound Length (cm) 2.3 cm    Wound Width (cm) 2 cm    Wound Depth (cm) 0.2 cm    Wound Volume (cm^3) 0.92 cm^3    Wound Surface Area (cm^2) 4.6 cm^2    Margins Attached edges (approximated)    Drainage Amount Minimal    Drainage Description Serous    Treatment Cleansed;Debridement (Selective)    Wound Properties Date First Assessed: 12/04/20 Time First Assessed: 1545 Wound Type: Venous stasis ulcer Location: Leg Location Orientation: Right Present on Admission: Yes   Wound Image Images linked: 1    Dressing Type Santyl;Compression wrap    Dressing Changed Changed    Dressing Status Old drainage    Dressing Change Frequency PRN    Site / Wound Assessment Red;Pink;Yellow    % Wound base Red or Granulating 40%    % Wound base Yellow/Fibrinous Exudate 60%    Peri-wound Assessment Erythema (blanchable)    Wound Length (cm) 4.5 cm    Wound Width (cm) 1.8 cm    Wound Depth (cm) 0.3 cm    Wound Volume (cm^3) 2.43 cm^3    Wound Surface Area (cm^2)  8.1 cm^2    Margins Attached edges (approximated)    Drainage Amount Minimal    Drainage Description Serous    Treatment Cleansed;Debridement (Selective)    Selective Debridement - Location both wound beds, pt unable to tolerate debridement of most superior    Selective Debridement - Tools Used Forceps;Scalpel    Selective Debridement - Tissue Removed slough    Wound Therapy - Clinical Statement periwound much improved with less dryness and erythema today.  Wounds photographed and measured without size differnce from last measurement, however inferior wound with increased granulation.  Overall looking much better but slowly improving.    Wound Therapy - Functional Problem List gait and balance    Factors Delaying/Impairing Wound Healing Diabetes Mellitus;Vascular compromise;Altered sensation    Wound Therapy - Frequency 6X / week    Wound Plan continue  2x/week    Dressing  cleanse with wet gauze, lotion, santyl to wounds, 2x2, thicker sockinette followed by  profore lite, #5 netting                       PT Short Term Goals - 07/12/21 1627       PT SHORT TERM GOAL #1   Title Patient will have at least 50% granualtion tissue.    Status Partially Met               PT Long Term Goals - 07/12/21 1627       PT LONG TERM GOAL #1   Title Patient's wound will be healed to reduce the risk of infection.    Status On-going      PT LONG TERM GOAL #2   Title Patient will be able to verbalize completion of daily skin checks to reduce the risk of further wound development.    Status On-going                    Patient will benefit from skilled therapeutic intervention in order to improve the following deficits and impairments:     Visit Diagnosis: Other abnormalities of gait and mobility  Ulcer of right pretibial region, limited to breakdown of skin [facility])     Problem List Patient Active Problem List   Diagnosis Date Noted   Advanced nonexudative  age-related macular degeneration of right eye without subfoveal involvement  11/15/2020   Advanced nonexudative age-related macular degeneration of left eye without subfoveal involvement 11/15/2020   Diabetes mellitus without complication ([place]) 73/31/2508   Posterior vitreous detachment of both eyes 11/15/2020   Gait disturbance, post-stroke 11/02/2017   Aphasia as late effect of cerebrovascular accident 11/02/2017   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   Aphasia    PAF (paroxysmal atrial fibrillation) ([place])    Diabetes mellitus type 2 in obese ([place])    Benign essential HTN    Hypothyroidism    Hyperlipidemia    Stroke (cerebrum) ([place]) 10/02/2017   Afib ([place]) 01/10/2017   SOB (shortness of breath) 71/99/4129   Acute diastolic CHF (congestive heart failure) ([place]) 01/10/2017   Atrial fibrillation with RVR ([place]) 01/10/2017   Claudication ([place]) 07/25/2016   Paroxysmal atrial fibrillation ([place]) 07/25/2016   Odynophagia 04/10/2014   Allergic rhinitis 03/23/2014   Chronic rhinitis 12/22/2013   Intrinsic asthma 07/15/2013   Constipation 08/22/2011   Obesity 09/20/2009   G E R D 11/29/2007   Celiac disease 11/29/2007   Teena Irani, PTA/CLT, WTA [phone]  Teena Irani, PTA 10/25/2021, 12:56 PM  [place] [address] [place], [place], [address] Phone: [phone]   Fax:  [phone]  Name: [name] MRN: [id number] Date of Birth: 03/31/1929

## 2021-10-28 DIAGNOSIS — D508 Other iron deficiency anemias: Secondary | ICD-10-CM | POA: Diagnosis not present

## 2021-10-28 DIAGNOSIS — E1122 Type 2 diabetes mellitus with diabetic chronic kidney disease: Secondary | ICD-10-CM | POA: Diagnosis not present

## 2021-10-28 DIAGNOSIS — R809 Proteinuria, unspecified: Secondary | ICD-10-CM | POA: Diagnosis not present

## 2021-10-28 DIAGNOSIS — N189 Chronic kidney disease, unspecified: Secondary | ICD-10-CM | POA: Diagnosis not present

## 2021-10-28 DIAGNOSIS — D638 Anemia in other chronic diseases classified elsewhere: Secondary | ICD-10-CM | POA: Diagnosis not present

## 2021-10-28 DIAGNOSIS — I129 Hypertensive chronic kidney disease with stage 1 through stage 4 chronic kidney disease, or unspecified chronic kidney disease: Secondary | ICD-10-CM | POA: Diagnosis not present

## 2021-10-28 DIAGNOSIS — E1129 Type 2 diabetes mellitus with other diabetic kidney complication: Secondary | ICD-10-CM | POA: Diagnosis not present

## 2021-10-28 DIAGNOSIS — E211 Secondary hyperparathyroidism, not elsewhere classified: Secondary | ICD-10-CM | POA: Diagnosis not present

## 2021-10-28 DIAGNOSIS — I10 Essential (primary) hypertension: Secondary | ICD-10-CM | POA: Diagnosis not present

## 2021-10-29 ENCOUNTER — Other Ambulatory Visit: Payer: Self-pay

## 2021-10-29 ENCOUNTER — Encounter (HOSPITAL_COMMUNITY): Payer: Self-pay

## 2021-10-29 ENCOUNTER — Ambulatory Visit (HOSPITAL_COMMUNITY): Payer: PPO

## 2021-10-29 DIAGNOSIS — R2689 Other abnormalities of gait and mobility: Secondary | ICD-10-CM | POA: Diagnosis not present

## 2021-10-29 DIAGNOSIS — L97811 Non-pressure chronic ulcer of other part of right lower leg limited to breakdown of skin: Secondary | ICD-10-CM

## 2021-10-29 NOTE — Therapy (Signed)
[place], [place], [address] Phone: [phone]   Fax:  [phone]  Wound Care Therapy  Patient Details  Name: [name] MRN: [id number] Date of Birth: July 07, 1929 Referring Provider (PT): [name]   Encounter Date: 10/29/2021   PT End of Session - 10/29/21 1650     Visit Number 102    Number of Visits 106    Date for PT Re-Evaluation 11/12/21    Authorization Type [employer] (no auth req, no visit limit)    Progress Note Due on Visit 106    PT Start Time 1450    PT Stop Time 1525    PT Time Calculation (min) 35 min    Activity Tolerance Patient tolerated treatment well    Behavior During Therapy [facility] for tasks assessed/performed             Past Medical History:  Diagnosis Date   Allergic rhinitis    Aortic stenosis    Asthmatic bronchitis    Celiac disease    Cellulitis    CKD (chronic kidney disease)    Diverticulosis    Fx. left wrist 1987   Gastric polyps    GERD (gastroesophageal reflux disease)    History of pneumonia    Hypertension    Hypothyroidism    Iron deficiency anemia    Melanoma ([place])    Neuropathy    PAF (paroxysmal atrial fibrillation) ([place])    Prostate cancer ([place])    REM sleep behavior disorder    RLS (restless legs syndrome)    Sleep apnea    Spasticity    Stroke ([place]) 09/2017   Tremor    Type 2 diabetes mellitus ([place])     Past Surgical History:  Procedure Laterality Date   APPENDECTOMY  1978   CARPAL TUNNEL RELEASE Left    CATARACT EXTRACTION  03/2011   bilateral    CHOLECYSTECTOMY  2001   COLONOSCOPY W/ BIOPSIES  04/27/2008   diverticulosis, duodenitis   FOOT SURGERY     HERNIA REPAIR  1994   bilateral   KNEE SURGERY Bilateral    x 2   LUNG SURGERY  2001   for infection   PROSTATECTOMY  2002   UPPER GASTROINTESTINAL ENDOSCOPY  04/27/2008   celiac disease, gastric polyps    There were no vitals filed for this visit.    Subjective  Assessment - 10/29/21 1644     Subjective Feeling good, no new issues.  Dressings intact.    Currently in Pain? No/denies                       Wound Therapy - 10/29/21 0001     Subjective Feeling good, no new issues.  Dressings intact.    Patient and Family Stated Goals wound to heal    Date of Onset 09/06/20    Prior Treatments MD and self care    Pain Scale 0-10    Pain Score 0-No pain    Evaluation and Treatment Procedures Explained to Patient/Family Yes    Evaluation and Treatment Procedures agreed to    Wound Properties Date First Assessed: 04/26/21 Time First Assessed: 7680 Wound Type: Non-pressure wound Location: Leg Location Orientation: Right Wound Description (Comments): superior to original wound Present on Admission: No   Wound Image Images linked: 1    Dressing Type Hydrogel;Santyl;Compression wrap    Dressing Changed Changed    Dressing Status Old drainage  Dressing Change Frequency PRN    Site / Wound Assessment Yellow;Red    % Wound base Red or Granulating 10%    % Wound base Yellow/Fibrinous Exudate 90%    Peri-wound Assessment Erythema (blanchable)    Margins Attached edges (approximated)    Drainage Amount Minimal    Drainage Description Serous    Treatment Cleansed;Debridement (Selective)    Wound Properties Date First Assessed: 12/04/20 Time First Assessed: 1545 Wound Type: Venous stasis ulcer Location: Leg Location Orientation: Right Present on Admission: Yes   Wound Image Images linked: 1    Dressing Type Santyl;Hydrogel;Compression wrap    Dressing Changed Changed    Dressing Status Old drainage    Dressing Change Frequency PRN    Site / Wound Assessment Red;Pink;Yellow    % Wound base Red or Granulating 40%    % Wound base Yellow/Fibrinous Exudate 60%    Peri-wound Assessment Erythema (blanchable)    Margins Attached edges (approximated)    Drainage Amount Minimal    Drainage Description Serous    Treatment Cleansed;Debridement  (Selective)    Selective Debridement - Location both wound beds, pt unable to tolerate debridement of most superior    Selective Debridement - Tools Used Forceps;Scalpel    Selective Debridement - Tissue Removed slough    Wound Therapy - Clinical Statement Improved granulation tissue following selective debridement.  Pt continues to be sensitive with superior wound.  Continued with santyl, hydrogel, and profore lite for edema control.  Heavy layer of vaseline perimeter of wound and lotion on skin to address dry skin.  Continued wiht lymphedema liner to address sensitive skin as seems to assist with erythema.    Wound Therapy - Functional Problem List gait and balance    Factors Delaying/Impairing Wound Healing Diabetes Mellitus;Vascular compromise;Altered sensation    Wound Therapy - Frequency 2X / week    Wound Plan Continue appropriate dressings    Dressing  cleanse with wet gauze, lotion, santyl to wounds, 2x2, thicker sockinette followed by  profore lite, #5 netting                       PT Short Term Goals - 07/12/21 1627       PT SHORT TERM GOAL #1   Title Patient will have at least 50% granualtion tissue.    Status Partially Met               PT Long Term Goals - 07/12/21 1627       PT LONG TERM GOAL #1   Title Patient's wound will be healed to reduce the risk of infection.    Status On-going      PT LONG TERM GOAL #2   Title Patient will be able to verbalize completion of daily skin checks to reduce the risk of further wound development.    Status On-going                    Patient will benefit from skilled therapeutic intervention in order to improve the following deficits and impairments:     Visit Diagnosis: Other abnormalities of gait and mobility  Ulcer of right pretibial region, limited to breakdown of skin [facility])     Problem List Patient Active Problem List   Diagnosis Date Noted   Advanced nonexudative age-related macular  degeneration of right eye without subfoveal involvement 11/15/2020   Advanced nonexudative age-related macular degeneration of left eye without subfoveal involvement 11/15/2020   Diabetes  mellitus without complication ([place]) 88/67/7373   Posterior vitreous detachment of both eyes 11/15/2020   Gait disturbance, post-stroke 11/02/2017   Aphasia as late effect of cerebrovascular accident 11/02/2017   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   Aphasia    PAF (paroxysmal atrial fibrillation) ([place])    Diabetes mellitus type 2 in obese ([place])    Benign essential HTN    Hypothyroidism    Hyperlipidemia    Stroke (cerebrum) ([place]) 10/02/2017   Afib ([place]) 01/10/2017   SOB (shortness of breath) 66/81/5947   Acute diastolic CHF (congestive heart failure) ([place]) 01/10/2017   Atrial fibrillation with RVR ([place]) 01/10/2017   Claudication ([place]) 07/25/2016   Paroxysmal atrial fibrillation ([place]) 07/25/2016   Odynophagia 04/10/2014   Allergic rhinitis 03/23/2014   Chronic rhinitis 12/22/2013   Intrinsic asthma 07/15/2013   Constipation 08/22/2011   Obesity 09/20/2009   G E R D 11/29/2007   Celiac disease 11/29/2007   Ihor Austin, LPTA/CLT; CBIS [phone]  Aldona Lento, PTA 10/29/2021, 4:51 PM  [place], [place], [address] Phone: [phone]   Fax:  [phone]  Name: ANWAR SAKATA MRN: [id number] Date of Birth: [birth year]

## 2021-10-30 ENCOUNTER — Encounter (INDEPENDENT_AMBULATORY_CARE_PROVIDER_SITE_OTHER): Payer: Self-pay | Admitting: *Deleted

## 2021-11-01 ENCOUNTER — Encounter (HOSPITAL_COMMUNITY): Payer: Self-pay

## 2021-11-01 ENCOUNTER — Ambulatory Visit (HOSPITAL_COMMUNITY): Payer: PPO

## 2021-11-01 ENCOUNTER — Other Ambulatory Visit: Payer: Self-pay

## 2021-11-01 DIAGNOSIS — L97811 Non-pressure chronic ulcer of other part of right lower leg limited to breakdown of skin: Secondary | ICD-10-CM

## 2021-11-01 DIAGNOSIS — R2689 Other abnormalities of gait and mobility: Secondary | ICD-10-CM | POA: Diagnosis not present

## 2021-11-01 NOTE — Therapy (Signed)
[place], [place], [address] Phone: [phone]   Fax:  [phone]  Wound Care Therapy  Patient Details  Name: [name] MRN: [id number] Date of Birth: Aug 08, 1929 Referring Provider (PT): [name]   Encounter Date: 11/01/2021   PT End of Session - 11/01/21 1805     Visit Number 103    Number of Visits 106    Date for PT Re-Evaluation 11/12/21    Authorization Type [employer] (no auth req, no visit limit)    Progress Note Due on Visit 106    PT Start Time 1321    PT Stop Time 1400    PT Time Calculation (min) 39 min    Activity Tolerance Patient tolerated treatment well    Behavior During Therapy [facility] for tasks assessed/performed             Past Medical History:  Diagnosis Date   Allergic rhinitis    Aortic stenosis    Asthmatic bronchitis    Celiac disease    Cellulitis    CKD (chronic kidney disease)    Diverticulosis    Fx. left wrist 1987   Gastric polyps    GERD (gastroesophageal reflux disease)    History of pneumonia    Hypertension    Hypothyroidism    Iron deficiency anemia    Melanoma ([place])    Neuropathy    PAF (paroxysmal atrial fibrillation) ([place])    Prostate cancer ([place])    REM sleep behavior disorder    RLS (restless legs syndrome)    Sleep apnea    Spasticity    Stroke ([place]) 09/2017   Tremor    Type 2 diabetes mellitus ([place])     Past Surgical History:  Procedure Laterality Date   APPENDECTOMY  1978   CARPAL TUNNEL RELEASE Left    CATARACT EXTRACTION  03/2011   bilateral    CHOLECYSTECTOMY  2001   COLONOSCOPY W/ BIOPSIES  04/27/2008   diverticulosis, duodenitis   FOOT SURGERY     HERNIA REPAIR  1994   bilateral   KNEE SURGERY Bilateral    x 2   LUNG SURGERY  2001   for infection   PROSTATECTOMY  2002   UPPER GASTROINTESTINAL ENDOSCOPY  04/27/2008   celiac disease, gastric polyps    There were no vitals filed for this visit.    Subjective  Assessment - 11/01/21 1553     Subjective Arrived wiht dressing intact, reports he's feeling good today.  No reports of pain or new issues.    Currently in Pain? No/denies                       Wound Therapy - 11/01/21 0001     Subjective Arrived wiht dressing intact, reports he's feeling good today.  No reports of pain or new issues.    Patient and Family Stated Goals wound to heal    Date of Onset 09/06/20    Prior Treatments MD and self care    Pain Scale 0-10    Pain Score 0-No pain    Evaluation and Treatment Procedures Explained to Patient/Family Yes    Evaluation and Treatment Procedures agreed to    Wound Properties Date First Assessed: 04/26/21 Time First Assessed: 2122 Wound Type: Non-pressure wound Location: Leg Location Orientation: Right Wound Description (Comments): superior to original wound Present on Admission: No   Wound Image Images linked: 1  Dressing Type Hydrogel;Santyl;Compression wrap   Hydrogel, Santyl, 2x2, vaseline perimeter, lotion whole leg, profore lite   Dressing Changed Changed    Dressing Status Old drainage    Dressing Change Frequency PRN    Site / Wound Assessment Yellow;Red    % Wound base Red or Granulating 20%    % Wound base Yellow/Fibrinous Exudate 80%    Peri-wound Assessment Erythema (blanchable)    Wound Length (cm) 2.3 cm    Wound Width (cm) 2 cm    Wound Depth (cm) 0.2 cm    Wound Volume (cm^3) 0.92 cm^3    Wound Surface Area (cm^2) 4.6 cm^2    Margins Attached edges (approximated)    Drainage Amount Minimal    Drainage Description Serous    Treatment Cleansed;Debridement (Selective)    Wound Properties Date First Assessed: 12/04/20 Time First Assessed: 1545 Wound Type: Venous stasis ulcer Location: Leg Location Orientation: Right Present on Admission: Yes   Wound Image Images linked: 1    Dressing Type Santyl;Hydrogel;Compression wrap   Hydrogel, Santyl, 2x2, vaseline perimeter, lotion whole leg, profore lite    Dressing Changed Changed    Dressing Status Old drainage    Dressing Change Frequency PRN    Site / Wound Assessment Pink;Red;Yellow    % Wound base Red or Granulating 45%    % Wound base Yellow/Fibrinous Exudate 55%    Peri-wound Assessment Erythema (blanchable)    Wound Length (cm) 4.5 cm    Wound Width (cm) 1.8 cm    Wound Depth (cm) 0.3 cm    Wound Volume (cm^3) 2.43 cm^3    Wound Surface Area (cm^2) 8.1 cm^2    Margins Attached edges (approximated)    Drainage Amount Minimal    Drainage Description Serous    Treatment Cleansed;Debridement (Selective)    Selective Debridement - Location both wound beds, decreased tolerance    Selective Debridement - Tools Used Forceps;Scalpel    Selective Debridement - Tissue Removed slough    Wound Therapy - Clinical Statement Improved granulation tissue following selective debridement especially wiht inferior wound.  Pt continues to be sensitive with inferior wound during debridement.  Continued wiht heavy layer on lotion on LE to address dry skin prior application of profore.  Continued wiht lymphedema liner as decreased overall erythema with that liner on LE.    Wound Therapy - Functional Problem List gait and balance    Factors Delaying/Impairing Wound Healing Diabetes Mellitus;Vascular compromise;Altered sensation    Wound Therapy - Frequency 2X / week    Wound Plan Continue appropriate dressings    Dressing  cleanse with wet gauze, lotion, santyl to wounds, 2x2, thicker sockinette followed by  profore lite, #5 netting                       PT Short Term Goals - 07/12/21 1627       PT SHORT TERM GOAL #1   Title Patient will have at least 50% granualtion tissue.    Status Partially Met               PT Long Term Goals - 07/12/21 1627       PT LONG TERM GOAL #1   Title Patient's wound will be healed to reduce the risk of infection.    Status On-going      PT LONG TERM GOAL #2   Title Patient will be able to  verbalize completion of daily skin checks to reduce the risk of  further wound development.    Status On-going                    Patient will benefit from skilled therapeutic intervention in order to improve the following deficits and impairments:     Visit Diagnosis: Other abnormalities of gait and mobility  Ulcer of right pretibial region, limited to breakdown of skin [facility])     Problem List Patient Active Problem List   Diagnosis Date Noted   Advanced nonexudative age-related macular degeneration of right eye without subfoveal involvement 11/15/2020   Advanced nonexudative age-related macular degeneration of left eye without subfoveal involvement 11/15/2020   Diabetes mellitus without complication ([place]) 80/22/3361   Posterior vitreous detachment of both eyes 11/15/2020   Gait disturbance, post-stroke 11/02/2017   Aphasia as late effect of cerebrovascular accident 11/02/2017   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   Aphasia    PAF (paroxysmal atrial fibrillation) ([place])    Diabetes mellitus type 2 in obese ([place])    Benign essential HTN    Hypothyroidism    Hyperlipidemia    Stroke (cerebrum) ([place]) 10/02/2017   Afib ([place]) 01/10/2017   SOB (shortness of breath) 22/44/9753   Acute diastolic CHF (congestive heart failure) ([place]) 01/10/2017   Atrial fibrillation with RVR ([place]) 01/10/2017   Claudication ([place]) 07/25/2016   Paroxysmal atrial fibrillation ([place]) 07/25/2016   Odynophagia 04/10/2014   Allergic rhinitis 03/23/2014   Chronic rhinitis 12/22/2013   Intrinsic asthma 07/15/2013   Constipation 08/22/2011   Obesity 09/20/2009   G E R D 11/29/2007   Celiac disease 11/29/2007   Ihor Austin, LPTA/CLT; CBIS [phone]  Aldona Lento, PTA 11/01/2021, 6:06 PM  [place], [place], [address] Phone: [phone]   Fax:  [phone]  Name: VERA WISHART MRN:  [id number] Date of Birth: 13-Apr-1929

## 2021-11-05 ENCOUNTER — Encounter (HOSPITAL_COMMUNITY): Payer: Self-pay

## 2021-11-05 ENCOUNTER — Ambulatory Visit (HOSPITAL_COMMUNITY): Payer: PPO

## 2021-11-05 ENCOUNTER — Other Ambulatory Visit: Payer: Self-pay

## 2021-11-05 DIAGNOSIS — R2689 Other abnormalities of gait and mobility: Secondary | ICD-10-CM

## 2021-11-05 DIAGNOSIS — L97811 Non-pressure chronic ulcer of other part of right lower leg limited to breakdown of skin: Secondary | ICD-10-CM

## 2021-11-05 NOTE — Therapy (Signed)
[place], [place], [address] Phone: [phone]   Fax:  [phone]  Wound Care Therapy  Patient Details  Name: [name] MRN: [id number] Date of Birth: 22-Aug-1929 Referring Provider (PT): [name]   Encounter Date: 11/05/2021   PT End of Session - 11/05/21 1749     Visit Number 104    Number of Visits 106    Date for PT Re-Evaluation 11/12/21    Authorization Type [employer] (no auth req, no visit limit)    Progress Note Due on Visit 106    PT Start Time 2778    PT Stop Time 1526    PT Time Calculation (min) 35 min    Activity Tolerance Patient tolerated treatment well    Behavior During Therapy [facility] for tasks assessed/performed             Past Medical History:  Diagnosis Date   Allergic rhinitis    Aortic stenosis    Asthmatic bronchitis    Celiac disease    Cellulitis    CKD (chronic kidney disease)    Diverticulosis    Fx. left wrist 1987   Gastric polyps    GERD (gastroesophageal reflux disease)    History of pneumonia    Hypertension    Hypothyroidism    Iron deficiency anemia    Melanoma ([place])    Neuropathy    PAF (paroxysmal atrial fibrillation) ([place])    Prostate cancer ([place])    REM sleep behavior disorder    RLS (restless legs syndrome)    Sleep apnea    Spasticity    Stroke ([place]) 09/2017   Tremor    Type 2 diabetes mellitus ([place])     Past Surgical History:  Procedure Laterality Date   APPENDECTOMY  1978   CARPAL TUNNEL RELEASE Left    CATARACT EXTRACTION  03/2011   bilateral    CHOLECYSTECTOMY  2001   COLONOSCOPY W/ BIOPSIES  04/27/2008   diverticulosis, duodenitis   FOOT SURGERY     HERNIA REPAIR  1994   bilateral   KNEE SURGERY Bilateral    x 2   LUNG SURGERY  2001   for infection   PROSTATECTOMY  2002   UPPER GASTROINTESTINAL ENDOSCOPY  04/27/2008   celiac disease, gastric polyps    There were no vitals filed for this visit.    Subjective  Assessment - 11/05/21 1742     Subjective No issues, arrived with dressings intact.  No reoprts of pain.                       Wound Therapy - 11/05/21 0001     Subjective No issues, arrived with dressings intact.  No reoprts of pain.    Patient and Family Stated Goals wound to heal    Date of Onset 09/06/20    Prior Treatments MD and self care    Pain Scale 0-10    Pain Score 0-No pain    Evaluation and Treatment Procedures Explained to Patient/Family Yes    Evaluation and Treatment Procedures agreed to    Wound Properties Date First Assessed: 04/26/21 Time First Assessed: 2423 Wound Type: Non-pressure wound Location: Leg Location Orientation: Right Wound Description (Comments): superior to original wound Present on Admission: No   Wound Image Images linked: 1    Dressing Type Hydrogel;Santyl;Compression wrap   Vaseline, Santyl/hydrogel on 2x2, lymphedema liner, profore wiht #5 netting   Dressing  Changed Changed    Dressing Status Old drainage    Dressing Change Frequency PRN    Site / Wound Assessment Yellow;Red    % Wound base Red or Granulating 20%    % Wound base Yellow/Fibrinous Exudate 90%    Peri-wound Assessment Erythema (blanchable)    Margins Attached edges (approximated)    Drainage Amount Minimal    Drainage Description Serous    Treatment Cleansed;Debridement (Selective)    Wound Properties Date First Assessed: 12/04/20 Time First Assessed: 1545 Wound Type: Venous stasis ulcer Location: Leg Location Orientation: Right Present on Admission: Yes   Wound Image Images linked: 1    Dressing Type Santyl;Hydrogel;Compression wrap   Vaseline, Santyl/hydrogel on 2x2, lymphedema liner, profore wiht #5 netting   Dressing Changed Changed    Dressing Status Old drainage    Dressing Change Frequency PRN    Site / Wound Assessment Pink;Red;Yellow    % Wound base Red or Granulating 45%    % Wound base Yellow/Fibrinous Exudate 55%    Peri-wound Assessment Erythema  (blanchable)    Margins Attached edges (approximated)    Drainage Amount Minimal    Drainage Description Serous    Treatment Cleansed;Debridement (Selective)    Selective Debridement - Location both wound beds, decreased tolerance    Selective Debridement - Tools Used Forceps;Scalpel    Selective Debridement - Tissue Removed slough    Wound Therapy - Clinical Statement Inferior wound with 2 granulation tissues noted following debridement for removal of adherent slough from wound bed.  LE presents with improved skin integrity with use of lymphedema liner.  Continued with hydrogel and Santyl with profore lite.  Reports comfort at EOS.    Wound Therapy - Functional Problem List gait and balance    Factors Delaying/Impairing Wound Healing Diabetes Mellitus;Vascular compromise;Altered sensation    Wound Therapy - Frequency 2X / week    Wound Plan Continue appropriate dressings    Dressing  cleanse with wet gauze, lotion, santyl to wounds, 2x2, thicker sockinette followed by  profore lite, #5 netting                       PT Short Term Goals - 07/12/21 1627       PT SHORT TERM GOAL #1   Title Patient will have at least 50% granualtion tissue.    Status Partially Met               PT Long Term Goals - 07/12/21 1627       PT LONG TERM GOAL #1   Title Patient's wound will be healed to reduce the risk of infection.    Status On-going      PT LONG TERM GOAL #2   Title Patient will be able to verbalize completion of daily skin checks to reduce the risk of further wound development.    Status On-going                    Patient will benefit from skilled therapeutic intervention in order to improve the following deficits and impairments:     Visit Diagnosis: Other abnormalities of gait and mobility  Ulcer of right pretibial region, limited to breakdown of skin [facility])     Problem List Patient Active Problem List   Diagnosis Date Noted   Advanced  nonexudative age-related macular degeneration of right eye without subfoveal involvement 11/15/2020   Advanced nonexudative age-related macular degeneration of left eye without subfoveal involvement 11/15/2020  Diabetes mellitus without complication ([place]) 83/77/9396   Posterior vitreous detachment of both eyes 11/15/2020   Gait disturbance, post-stroke 11/02/2017   Aphasia as late effect of cerebrovascular accident 11/02/2017   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   Aphasia    PAF (paroxysmal atrial fibrillation) ([place])    Diabetes mellitus type 2 in obese ([place])    Benign essential HTN    Hypothyroidism    Hyperlipidemia    Stroke (cerebrum) ([place]) 10/02/2017   Afib ([place]) 01/10/2017   SOB (shortness of breath) 88/64/8472   Acute diastolic CHF (congestive heart failure) ([place]) 01/10/2017   Atrial fibrillation with RVR ([place]) 01/10/2017   Claudication ([place]) 07/25/2016   Paroxysmal atrial fibrillation ([place]) 07/25/2016   Odynophagia 04/10/2014   Allergic rhinitis 03/23/2014   Chronic rhinitis 12/22/2013   Intrinsic asthma 07/15/2013   Constipation 08/22/2011   Obesity 09/20/2009   G E R D 11/29/2007   Celiac disease 11/29/2007   Ihor Austin, LPTA/CLT; CBIS [phone]  Aldona Lento, PTA 11/05/2021, 5:50 PM  [place], [place], [address] Phone: [phone]   Fax:  [phone]  Name: [name] MRN: [id number] Date of Birth: 10/29/1928

## 2021-11-08 ENCOUNTER — Ambulatory Visit (HOSPITAL_COMMUNITY): Payer: PPO

## 2021-11-08 ENCOUNTER — Encounter (HOSPITAL_COMMUNITY): Payer: Self-pay

## 2021-11-08 ENCOUNTER — Other Ambulatory Visit: Payer: Self-pay

## 2021-11-08 DIAGNOSIS — R2689 Other abnormalities of gait and mobility: Secondary | ICD-10-CM | POA: Diagnosis not present

## 2021-11-08 NOTE — Therapy (Signed)
[place] °[facility] °[address] °[place], [place], [address] °Phone: [phone]   Fax:  [phone] ° °Wound Care Therapy ° °Patient Details  °Name: [name] °MRN: [id number] °Date of Birth: 07/31/1929 °Referring Provider (PT): Zach Hall ° ° °Encounter Date: 11/08/2021 ° ° PT End of Session - 11/08/21 1548   ° ° Visit Number 105   ° Number of Visits 106   ° Date for PT Re-Evaluation 11/12/21   ° Authorization Type [employer] (no auth req, no visit limit)   ° Progress Note Due on Visit 106   ° PT Start Time 1455   ° PT Stop Time 1530   ° PT Time Calculation (min) 35 min   ° Activity Tolerance Patient tolerated treatment well   ° Behavior During Therapy WFL for tasks assessed/performed   ° °  °  ° °  ° ° °Past Medical History:  °Diagnosis Date  ° Allergic rhinitis   ° Aortic stenosis   ° Asthmatic bronchitis   ° Celiac disease   ° Cellulitis   ° CKD (chronic kidney disease)   ° Diverticulosis   ° Fx. left wrist 1987  ° Gastric polyps   ° GERD (gastroesophageal reflux disease)   ° History of pneumonia   ° Hypertension   ° Hypothyroidism   ° Iron deficiency anemia   ° Melanoma ([facility])   ° Neuropathy   ° PAF (paroxysmal atrial fibrillation) ([facility])   ° Prostate cancer ([facility])   ° REM sleep behavior disorder   ° RLS (restless legs syndrome)   ° Sleep apnea   ° Spasticity   ° Stroke ([facility]) 09/2017  ° Tremor   ° Type 2 diabetes mellitus ([facility])   ° ° °Past Surgical History:  °Procedure Laterality Date  ° APPENDECTOMY  1978  ° CARPAL TUNNEL RELEASE Left   ° CATARACT EXTRACTION  03/2011  ° bilateral   ° CHOLECYSTECTOMY  2001  ° COLONOSCOPY W/ BIOPSIES  04/27/2008  ° diverticulosis, duodenitis  ° FOOT SURGERY    ° HERNIA REPAIR  1994  ° bilateral  ° KNEE SURGERY Bilateral   ° x 2  ° LUNG SURGERY  2001  ° for infection  ° PROSTATECTOMY  2002  ° UPPER GASTROINTESTINAL ENDOSCOPY  04/27/2008  ° celiac disease, gastric polyps  ° ° °There were no vitals filed for this visit. ° ° ° Subjective  Assessment - 11/08/21 1538   ° ° Subjective No issues, arrived with dressings intact.  No reoprts of pain.   ° °  °  ° °  ° ° ° ° ° ° ° ° ° ° ° ° Wound Therapy - 11/08/21 0001   ° ° Subjective No issues, arrived with dressings intact.  No reoprts of pain.   ° Patient and Family Stated Goals wound to heal   ° Date of Onset 09/06/20   ° Prior Treatments MD and self care   ° Pain Scale 0-10   ° Pain Score 0-No pain   ° Evaluation and Treatment Procedures Explained to Patient/Family Yes   ° Evaluation and Treatment Procedures agreed to   ° Wound Properties Date First Assessed: 04/26/21 Time First Assessed: 1549 Wound Type: Non-pressure wound Location: Leg Location Orientation: Right Wound Description (Comments): superior to original wound Present on Admission: No  ° Wound Image Images linked: 1   ° Dressing Type Hydrogel;Santyl;Compression wrap   vaseline/lotion, thick liner, hydrogel, santyl, profore lite  ° Dressing Changed Changed   °   Dressing Status Old drainage    Dressing Change Frequency PRN    Site / Wound Assessment Red;Yellow    % Wound base Red or Granulating 25%    % Wound base Yellow/Fibrinous Exudate 75%    Peri-wound Assessment Erythema (blanchable)    Wound Length (cm) 2.3 cm    Wound Width (cm) 2 cm    Wound Depth (cm) 0.2 cm    Wound Volume (cm^3) 0.92 cm^3    Wound Surface Area (cm^2) 4.6 cm^2    Margins Attached edges (approximated)    Drainage Amount Minimal    Drainage Description Serous    Treatment Cleansed;Debridement (Selective)    Wound Properties Date First Assessed: 12/04/20 Time First Assessed: 1545 Wound Type: Venous stasis ulcer Location: Leg Location Orientation: Right Present on Admission: Yes   Wound Image Images linked: 1    Dressing Type Hydrogel;Santyl;Compression wrap    Dressing Changed Changed    Dressing Status Old drainage    Dressing Change Frequency PRN    Site / Wound Assessment Pink;Red;Yellow    % Wound base Red or Granulating 50%    % Wound base  Yellow/Fibrinous Exudate 50%    Peri-wound Assessment Erythema (blanchable)    Wound Length (cm) 4.5 cm    Wound Width (cm) 2.5 cm   Superior at .3, inferior at 2.5   Wound Depth (cm) 0.3 cm    Wound Volume (cm^3) 3.38 cm^3    Wound Surface Area (cm^2) 11.25 cm^2    Margins Attached edges (approximated)    Drainage Amount Minimal    Drainage Description Serous    Treatment Cleansed;Debridement (Selective)    Selective Debridement - Location both wound beds, decreased tolerance    Selective Debridement - Tools Used Forceps;Scalpel    Selective Debridement - Tissue Removed slough    Wound Therapy - Clinical Statement Able to remove superior layer of slough, adherent slough still present though improved granulation tissue followind debriement.  Continued with hydrogel, Santyl, thicker liner and profore lite with netting.  Pt continues to be sensitive with superior wound, improved tolerance wiht debridement this session.    Wound Therapy - Functional Problem List gait and balance    Factors Delaying/Impairing Wound Healing Diabetes Mellitus;Vascular compromise;Altered sensation    Wound Therapy - Frequency 2X / week    Wound Plan Progress note due.  Continue appropriate dressings    Dressing  cleanse with wet gauze, lotion, santyl to wounds, 2x2, thicker sockinette followed by  profore lite, #5 netting                       PT Short Term Goals - 07/12/21 1627       PT SHORT TERM GOAL #1   Title Patient will have at least 50% granualtion tissue.    Status Partially Met               PT Long Term Goals - 07/12/21 1627       PT LONG TERM GOAL #1   Title Patient's wound will be healed to reduce the risk of infection.    Status On-going      PT LONG TERM GOAL #2   Title Patient will be able to verbalize completion of daily skin checks to reduce the risk of further wound development.    Status On-going                    Patient will benefit from  skilled  therapeutic intervention in order to improve the following deficits and impairments:     Visit Diagnosis: Other abnormalities of gait and mobility     Problem List Patient Active Problem List   Diagnosis Date Noted   Advanced nonexudative age-related macular degeneration of right eye without subfoveal involvement 11/15/2020   Advanced nonexudative age-related macular degeneration of left eye without subfoveal involvement 11/15/2020   Diabetes mellitus without complication ([place]) 70/96/2836   Posterior vitreous detachment of both eyes 11/15/2020   Gait disturbance, post-stroke 11/02/2017   Aphasia as late effect of cerebrovascular accident 11/02/2017   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   Aphasia    PAF (paroxysmal atrial fibrillation) ([place])    Diabetes mellitus type 2 in obese ([place])    Benign essential HTN    Hypothyroidism    Hyperlipidemia    Stroke (cerebrum) ([place]) 10/02/2017   Afib ([place]) 01/10/2017   SOB (shortness of breath) 62/94/7654   Acute diastolic CHF (congestive heart failure) ([place]) 01/10/2017   Atrial fibrillation with RVR ([place]) 01/10/2017   Claudication ([place]) 07/25/2016   Paroxysmal atrial fibrillation ([place]) 07/25/2016   Odynophagia 04/10/2014   Allergic rhinitis 03/23/2014   Chronic rhinitis 12/22/2013   Intrinsic asthma 07/15/2013   Constipation 08/22/2011   Obesity 09/20/2009   G E R D 11/29/2007   Celiac disease 11/29/2007   Ihor Austin, LPTA/CLT; CBIS [phone]  Aldona Lento, PTA 11/08/2021, 3:50 PM  [facility] [address] [place], [place], [address] Phone: [phone]   Fax:  [phone]  Name: [name] MRN: [id number] Date of Birth: [birth year]

## 2021-11-12 ENCOUNTER — Ambulatory Visit (HOSPITAL_COMMUNITY): Payer: PPO

## 2021-11-12 ENCOUNTER — Other Ambulatory Visit: Payer: Self-pay

## 2021-11-12 ENCOUNTER — Encounter (HOSPITAL_COMMUNITY): Payer: Self-pay

## 2021-11-12 DIAGNOSIS — R2689 Other abnormalities of gait and mobility: Secondary | ICD-10-CM

## 2021-11-12 DIAGNOSIS — E785 Hyperlipidemia, unspecified: Secondary | ICD-10-CM | POA: Diagnosis not present

## 2021-11-12 DIAGNOSIS — I10 Essential (primary) hypertension: Secondary | ICD-10-CM | POA: Diagnosis not present

## 2021-11-12 DIAGNOSIS — L97811 Non-pressure chronic ulcer of other part of right lower leg limited to breakdown of skin: Secondary | ICD-10-CM

## 2021-11-12 NOTE — Therapy (Addendum)
[place], [place], [address] Phone: [phone]   Fax:  [phone]  Wound Care Therapy Progress Note Reporting Period 10/01/21 to 11/12/21  See note below for Objective Data and Assessment of Progress/Goals.     Patient Details  Name: [name] MRN: [id number] Date of Birth: Feb 28, 1929 Referring Provider (PT): [name]   Encounter Date: 11/12/2021   PT End of Session - 11/12/21 1635     Visit Number 106    Number of Visits 122    Date for PT Re-Evaluation 01/08/22    Authorization Type [employer] (no auth req, no visit limit)    Progress Note Due on Visit 106    PT Start Time 1450    PT Stop Time 4259    PT Time Calculation (min) 45 min    Activity Tolerance Patient tolerated treatment well    Behavior During Therapy WFL for tasks assessed/performed             Past Medical History:  Diagnosis Date   Allergic rhinitis    Aortic stenosis    Asthmatic bronchitis    Celiac disease    Cellulitis    CKD (chronic kidney disease)    Diverticulosis    Fx. left wrist 1987   Gastric polyps    GERD (gastroesophageal reflux disease)    History of pneumonia    Hypertension    Hypothyroidism    Iron deficiency anemia    Melanoma ([place])    Neuropathy    PAF (paroxysmal atrial fibrillation) ([place])    Prostate cancer ([place])    REM sleep behavior disorder    RLS (restless legs syndrome)    Sleep apnea    Spasticity    Stroke ([place]) 09/2017   Tremor    Type 2 diabetes mellitus ([place])     Past Surgical History:  Procedure Laterality Date   APPENDECTOMY  1978   CARPAL TUNNEL RELEASE Left    CATARACT EXTRACTION  03/2011   bilateral    CHOLECYSTECTOMY  2001   COLONOSCOPY W/ BIOPSIES  04/27/2008   diverticulosis, duodenitis   FOOT SURGERY     HERNIA REPAIR  1994   bilateral   KNEE SURGERY Bilateral    x 2   LUNG SURGERY  2001   for infection   PROSTATECTOMY  2002   UPPER  GASTROINTESTINAL ENDOSCOPY  04/27/2008   celiac disease, gastric polyps    There were no vitals filed for this visit.    Subjective Assessment - 11/12/21 1631     Subjective No issues, arrived with dressings intact.  No reoprts of pain.                       Wound Therapy - 11/12/21 0001     Subjective No issues, arrived with dressings intact.  No reoprts of pain.    Patient and Family Stated Goals wound to heal    Date of Onset 09/06/20    Prior Treatments MD and self care    Pain Scale 0-10    Pain Score 0-No pain    Evaluation and Treatment Procedures Explained to Patient/Family Yes    Evaluation and Treatment Procedures agreed to    Wound Properties Date First Assessed: 04/26/21 Time First Assessed: 5638 Wound Type: Non-pressure wound Location: Leg Location Orientation: Right Wound Description (Comments): superior to original wound Present on Admission: No   Dressing Type Hydrogel;Santyl;Compression wrap  Vaseline perimeter, hydrogel and santyl wound bed, 4x4, thick liner, profore lite   Dressing Changed Changed    Dressing Status Old drainage    Dressing Change Frequency PRN    Site / Wound Assessment Yellow;Red    % Wound base Red or Granulating 30%    % Wound base Yellow/Fibrinous Exudate 70%    Peri-wound Assessment Erythema (blanchable)    Margins Attached edges (approximated)    Drainage Amount Minimal    Drainage Description Serous    Treatment Cleansed;Debridement (Selective)    Wound Properties Date First Assessed: 12/04/20 Time First Assessed: 1545 Wound Type: Venous stasis ulcer Location: Leg Location Orientation: Right Present on Admission: Yes   Dressing Type Hydrogel;Santyl;Compression wrap   Vaseline perimeter, hydrogel and santyl wound bed, 4x4, thick liner, profore lite   Dressing Changed Changed    Dressing Status Old drainage    Dressing Change Frequency PRN    Site / Wound Assessment Yellow;Red;Pink    % Wound base Red or Granulating 55%     % Wound base Yellow/Fibrinous Exudate 45%    Margins Attached edges (approximated)    Drainage Amount Minimal    Drainage Description Serous    Treatment Cleansed;Debridement (Selective)    Selective Debridement - Location both wound beds, decreased tolerance    Selective Debridement - Tools Used Forceps;Scalpel    Selective Debridement - Tissue Removed slough    Wound Therapy - Clinical Statement Improved granulation tissue following selective debridement.  Continued with Santyl to address adherent slough in wound bed and profore lite for edema control.    Wound Therapy - Functional Problem List gait and balance    Factors Delaying/Impairing Wound Healing Diabetes Mellitus;Vascular compromise;Altered sensation    Wound Therapy - Frequency 2X / week    Wound Plan Progress note due.  Continue appropriate dressings    Dressing  cleanse with wet gauze, lotion, santyl to wounds, 2x2, thicker sockinette followed by  profore lite, #5 netting               Assessment: Patient has partially met 1/1 short term goals and not met any long term goals due to continued wounds present. Patient continues to make progress with wound healing at this time but progress has been slow due to age and co-morbidities. Patient will continue to benefit from physical therapy to promote wound healing. Extending POC 2x/week for 8 weeks.  10:05 AM, 11/13/21 Mearl Latin PT, DPT Physical Therapist at [facility]         PT Short Term Goals - 11/12/21 1638       PT SHORT TERM GOAL #1   Title Patient will have at least 50% granualtion tissue.    Status Partially Met               PT Long Term Goals - 11/12/21 1638       PT LONG TERM GOAL #1   Title Patient's wound will be healed to reduce the risk of infection.    Status On-going      PT LONG TERM GOAL #2   Title Patient will be able to verbalize completion of daily skin checks to reduce the risk of further  wound development.    Status On-going                    Patient will benefit from skilled therapeutic intervention in order to improve the following deficits and impairments:  Visit Diagnosis: Other abnormalities of gait and mobility  Ulcer of right pretibial region, limited to breakdown of skin [facility])     Problem List Patient Active Problem List   Diagnosis Date Noted   Advanced nonexudative age-related macular degeneration of right eye without subfoveal involvement 11/15/2020   Advanced nonexudative age-related macular degeneration of left eye without subfoveal involvement 11/15/2020   Diabetes mellitus without complication ([place]) 02/27/3357   Posterior vitreous detachment of both eyes 11/15/2020   Gait disturbance, post-stroke 11/02/2017   Aphasia as late effect of cerebrovascular accident 11/02/2017   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   Aphasia    PAF (paroxysmal atrial fibrillation) ([place])    Diabetes mellitus type 2 in obese ([place])    Benign essential HTN    Hypothyroidism    Hyperlipidemia    Stroke (cerebrum) ([place]) 10/02/2017   Afib ([place]) 01/10/2017   SOB (shortness of breath) 25/18/9842   Acute diastolic CHF (congestive heart failure) ([place]) 01/10/2017   Atrial fibrillation with RVR ([place]) 01/10/2017   Claudication ([place]) 07/25/2016   Paroxysmal atrial fibrillation ([place]) 07/25/2016   Odynophagia 04/10/2014   Allergic rhinitis 03/23/2014   Chronic rhinitis 12/22/2013   Intrinsic asthma 07/15/2013   Constipation 08/22/2011   Obesity 09/20/2009   G E R D 11/29/2007   Celiac disease 11/29/2007    Aldona Lento, PTA 11/12/2021, 4:38 PM  [place], [place], [address] Phone: [phone]   Fax:  [phone]  Name: CARRY ORTEZ MRN: [id number] Date of Birth: 15-Dec-1928

## 2021-11-13 NOTE — Addendum Note (Signed)
Addended by: Mearl Latin on: 11/13/2021 10:06 AM   Modules accepted: Orders

## 2021-11-15 ENCOUNTER — Other Ambulatory Visit: Payer: Self-pay

## 2021-11-15 ENCOUNTER — Encounter (HOSPITAL_COMMUNITY): Payer: Self-pay | Admitting: Physical Therapy

## 2021-11-15 ENCOUNTER — Ambulatory Visit (HOSPITAL_COMMUNITY): Payer: PPO | Attending: Internal Medicine | Admitting: Physical Therapy

## 2021-11-15 DIAGNOSIS — R2689 Other abnormalities of gait and mobility: Secondary | ICD-10-CM | POA: Insufficient documentation

## 2021-11-15 DIAGNOSIS — L97811 Non-pressure chronic ulcer of other part of right lower leg limited to breakdown of skin: Secondary | ICD-10-CM | POA: Diagnosis not present

## 2021-11-15 NOTE — Therapy (Signed)
[place], [place], [address] Phone: [phone]   Fax:  [phone]  Wound Care Therapy  Patient Details  Name: [name] MRN: [id number] Date of Birth: [birth year] Referring Provider (PT): [name]   Encounter Date: 11/15/2021   PT End of Session - 11/15/21 1555     Visit Number 107    Number of Visits 122    Date for PT Re-Evaluation 01/08/22    Authorization Type [employer] (no auth req, no visit limit)    Progress Note Due on Visit 116    PT Start Time 1455    PT Stop Time 1530    PT Time Calculation (min) 35 min    Activity Tolerance Patient tolerated treatment well    Behavior During Therapy [facility] for tasks assessed/performed             Past Medical History:  Diagnosis Date   Allergic rhinitis    Aortic stenosis    Asthmatic bronchitis    Celiac disease    Cellulitis    CKD (chronic kidney disease)    Diverticulosis    Fx. left wrist 1987   Gastric polyps    GERD (gastroesophageal reflux disease)    History of pneumonia    Hypertension    Hypothyroidism    Iron deficiency anemia    Melanoma ([place])    Neuropathy    PAF (paroxysmal atrial fibrillation) ([place])    Prostate cancer ([place])    REM sleep behavior disorder    RLS (restless legs syndrome)    Sleep apnea    Spasticity    Stroke ([place]) 09/2017   Tremor    Type 2 diabetes mellitus ([place])     Past Surgical History:  Procedure Laterality Date   APPENDECTOMY  1978   CARPAL TUNNEL RELEASE Left    CATARACT EXTRACTION  03/2011   bilateral    CHOLECYSTECTOMY  2001   COLONOSCOPY W/ BIOPSIES  04/27/2008   diverticulosis, duodenitis   FOOT SURGERY     HERNIA REPAIR  1994   bilateral   KNEE SURGERY Bilateral    x 2   LUNG SURGERY  2001   for infection   PROSTATECTOMY  2002   UPPER GASTROINTESTINAL ENDOSCOPY  04/27/2008   celiac disease, gastric polyps    There were no vitals filed for this  visit.               Wound Therapy - 11/15/21 0001     Subjective Pt states that he is not having any pain.    Patient and Family Stated Goals wound to heal    Date of Onset 09/06/20    Prior Treatments MD and self care    Pain Scale 0-10    Pain Score 0-No pain    Evaluation and Treatment Procedures Explained to Patient/Family Yes    Evaluation and Treatment Procedures agreed to    Wound Properties Date First Assessed: 04/26/21 Time First Assessed: 2641 Wound Type: Non-pressure wound Location: Leg Location Orientation: Right Wound Description (Comments): superior to original wound Present on Admission: No   Wound Image Images linked: 1    Dressing Type Compression wrap;Hydrogel;Santyl    Dressing Changed Changed    Dressing Status Old drainage    Dressing Change Frequency PRN    Site / Wound Assessment Red;Yellow    % Wound base Red or Granulating 30%    % Wound base Yellow/Fibrinous Exudate 70%  Wound Length (cm) 2.3 cm   was 2.3   Wound Width (cm) 1.7 cm   was 2   Wound Depth (cm) 0.2 cm    Wound Volume (cm^3) 0.78 cm^3    Wound Surface Area (cm^2) 3.91 cm^2    Margins Attached edges (approximated)    Drainage Amount Minimal    Drainage Description Serous    Treatment Cleansed;Debridement (Selective)    Wound Properties Date First Assessed: 12/04/20 Time First Assessed: 1545 Wound Type: Venous stasis ulcer Location: Leg Location Orientation: Right Present on Admission: Yes   Dressing Type Compression wrap;Hydrogel;Santyl    Dressing Changed Changed    Dressing Status Old drainage    Dressing Change Frequency PRN    Site / Wound Assessment Yellow;Red;Pink    % Wound base Red or Granulating 55%    % Wound base Yellow/Fibrinous Exudate 45%    Wound Length (cm) 4 cm   was 4.5   Wound Width (cm) 2.1 cm   was 2.5   Wound Depth (cm) 0.3 cm    Wound Volume (cm^3) 2.52 cm^3    Wound Surface Area (cm^2) 8.4 cm^2    Margins Attached edges (approximated)    Drainage  Amount Minimal    Drainage Description Serous    Treatment Cleansed;Debridement (Selective)    Selective Debridement - Location B wound beds    Selective Debridement - Tools Used Forceps;Scalpel    Selective Debridement - Tissue Removed slough    Wound Therapy - Clinical Statement Wounds measured today with noted approximation compared to last week.  PT continues intolerance to mechanical debridement therefore only able to debride slough that is easily removed.  Pt will continue to benefit from skilled PT to maintain a healing environment to allow closure of wounds    Wound Therapy - Functional Problem List gait and balance    Factors Delaying/Impairing Wound Healing Diabetes Mellitus;Vascular compromise;Altered sensation    Wound Therapy - Frequency 2X / week    Wound Plan continue treatment    Dressing  cleanse with wet gauze, lotion, santyl to wounds, 2x2, thicker sockinette followed by  profore lite, #5 netting                       PT Short Term Goals - 11/15/21 1556       PT SHORT TERM GOAL #1   Title Patient will have at least 50% granualtion tissue.    Status Partially Met               PT Long Term Goals - 11/15/21 1556       PT LONG TERM GOAL #1   Title Patient's wound will be healed to reduce the risk of infection.    Status On-going      PT LONG TERM GOAL #2   Title Patient will be able to verbalize completion of daily skin checks to reduce the risk of further wound development.    Status On-going                   Plan - 11/15/21 1555     Clinical Impression Statement See above    Personal Factors and Comorbidities Age;Comorbidity 3+;Past/Current Experience;Fitness;Time since onset of injury/illness/exacerbation    Comorbidities DM, CKD, neuorpathy, Hx CVA    Examination-Activity Limitations Locomotion Level;Transfers;Squat;Stand;Dressing    Examination-Participation Restrictions Community Activity    Stability/Clinical Decision  Making Stable/Uncomplicated    Rehab Potential Good    PT Frequency 2x /  week    PT Duration 6 weeks    PT Treatment/Interventions Gait training;Stair training;Functional mobility training;DME Instruction;Therapeutic activities;Therapeutic exercise;Balance training;Neuromuscular re-education;Patient/family education;Compression bandaging;Manual lymph drainage;Manual techniques    PT Next Visit Plan Continue with both chemical and mechanical debridement to remove adherent eschar to allow healing to occur    Consulted and Agree with Plan of Care Patient;Family member/caregiver    Family Member Consulted wife             Patient will benefit from skilled therapeutic intervention in order to improve the following deficits and impairments:  Abnormal gait, Decreased skin integrity, Decreased mobility, Impaired sensation  Visit Diagnosis: Other abnormalities of gait and mobility  Ulcer of right pretibial region, limited to breakdown of skin [facility])     Problem List Patient Active Problem List   Diagnosis Date Noted   Advanced nonexudative age-related macular degeneration of right eye without subfoveal involvement 11/15/2020   Advanced nonexudative age-related macular degeneration of left eye without subfoveal involvement 11/15/2020   Diabetes mellitus without complication ([place]) 62/95/2841   Posterior vitreous detachment of both eyes 11/15/2020   Gait disturbance, post-stroke 11/02/2017   Aphasia as late effect of cerebrovascular accident 11/02/2017   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) 01/10/2017   Atrial fibrillation with RVR ([place]) 01/10/2017    Claudication ([place]) 07/25/2016   Paroxysmal atrial fibrillation ([place]) 07/25/2016   Odynophagia 04/10/2014   Allergic rhinitis 03/23/2014   Chronic rhinitis 12/22/2013   Intrinsic asthma 07/15/2013   Constipation 08/22/2011   Obesity 09/20/2009   G E R D 11/29/2007   Celiac disease 11/29/2007   Rayetta Humphrey, PT CLT [phone]  11/15/2021, 3:57 PM  [place], [place], [address] Phone: [phone]   Fax:  [phone]  Name: CLEMENT DENEAULT MRN: [id number] Date of Birth: 04-08-1929

## 2021-11-18 ENCOUNTER — Ambulatory Visit (INDEPENDENT_AMBULATORY_CARE_PROVIDER_SITE_OTHER): Payer: PPO | Admitting: Ophthalmology

## 2021-11-18 ENCOUNTER — Other Ambulatory Visit: Payer: Self-pay

## 2021-11-18 ENCOUNTER — Encounter (INDEPENDENT_AMBULATORY_CARE_PROVIDER_SITE_OTHER): Payer: Self-pay | Admitting: Ophthalmology

## 2021-11-18 DIAGNOSIS — H353123 Nonexudative age-related macular degeneration, left eye, advanced atrophic without subfoveal involvement: Secondary | ICD-10-CM

## 2021-11-18 DIAGNOSIS — H353113 Nonexudative age-related macular degeneration, right eye, advanced atrophic without subfoveal involvement: Secondary | ICD-10-CM | POA: Diagnosis not present

## 2021-11-18 NOTE — Assessment & Plan Note (Signed)
The nature of dry age related macular degeneration was discussed with the patient as well as its possible conversion to wet. The results of the AREDS 2 study was discussed with the patient. A diet rich in dark leafy green vegetables was advised and specific recommendations were made regarding supplements with AREDS 2 formulation . Control of hypertension and serum cholesterol may slow the disease. Smoking cessation is mandatory to slow the disease and diminish the risk of progressing to wet age related macular degeneration. The patient was instructed in the use of an [place] and was told to return immediately for any changes in the Grid. Stressed to the patient do not rub eyes  No sign of wet AMD OU.  I discussed the 2 pending applications with the FDA for approval of the injectable medications to slow the progression of geographic atrophy.

## 2021-11-18 NOTE — Progress Notes (Signed)
11/18/2021     CHIEF COMPLAINT Patient presents for  Chief Complaint  Patient presents with   Retina Follow Up      HISTORY OF PRESENT ILLNESS: [name] is a 88 y.o. male who presents to the clinic today for:   HPI     Retina Follow Up           Diagnosis: Dry AMD   Laterality: both eyes   Onset: 8 months ago   Severity: mild   Duration: 8 months   Course: stable         Comments   8 month fu ou and oct  Pt states VA OU stable since last visit. Pt denies FOL, floaters, or ocular pain OU.  Pt states, "My vision fades in and out sometimes and gets more blurry at the end of the day."        Last edited by Kendra Opitz, COA on 11/18/2021  1:05 PM.      Referring physician: Celene Squibb, MD [place],  [place] [address]  HISTORICAL INFORMATION:   Selected notes from the MEDICAL RECORD NUMBER    Lab Results  Component Value Date   HGBA1C 7.8 (H) 10/03/2017     CURRENT MEDICATIONS: No current outpatient medications on file. (Ophthalmic Drugs)   No current facility-administered medications for this visit. (Ophthalmic Drugs)   Current Outpatient Medications (Other)  Medication Sig   acetaminophen (TYLENOL) 325 MG tablet Take 650 mg by mouth every 6 (six) hours as needed. Takes 2 once daily   amitriptyline (ELAVIL) 50 MG tablet Take 50 mg by mouth at bedtime.   apixaban (ELIQUIS) 2.5 MG TABS tablet Take 1 tablet (2.5 mg total) by mouth 2 (two) times daily.   Ascorbic Acid (VITAMIN C) 1000 MG tablet Take 1,000 mg by mouth 2 (two) times daily.   atorvastatin (LIPITOR) 20 MG tablet Take 20 mg by mouth daily.   azelastine (ASTELIN) 0.1 % nasal spray Place 1 spray into both nostrils at bedtime.    calcitRIOL (ROCALTROL) 0.25 MCG capsule Take 0.25 mcg by mouth daily.   Cholecalciferol (VITAMIN D3) 1000 UNITS CAPS Take 1,000 Units by mouth daily.   diclofenac sodium (VOLTAREN) 1 % GEL Apply 2 g topically 4 (four) times daily.    empagliflozin (JARDIANCE) 10 MG TABS tablet Take 10 mg by mouth daily.   Fexofenadine HCl (MUCINEX ALLERGY PO) Take 1 tablet by mouth daily.   fluticasone (FLONASE) 50 MCG/ACT nasal spray Place 1 spray into both nostrils daily.    furosemide (LASIX) 20 MG tablet Take 20 mg by mouth. 80mg  in the morning and 60 mg in the afternoon   Ginger, Zingiber officinalis, (GINGER ROOT) 550 MG CAPS Take by mouth. 2 daily   glipiZIDE (GLUCOTROL XL) 10 MG 24 hr tablet 10 mg.   JANUVIA 100 MG tablet 100 mg daily. morning   levocetirizine (XYZAL) 5 MG tablet Take 5 mg by mouth at bedtime.    levothyroxine (SYNTHROID, LEVOTHROID) 150 MCG tablet Take 1 tablet (150 mcg total) by mouth daily before breakfast.   MAGNESIUM MALATE PO Take 500 mg by mouth daily.   Melatonin 3 MG TABS Take 3 mg by mouth at bedtime.   metoprolol tartrate (LOPRESSOR) 25 MG tablet TAKE 2 TABLETS BY MOUTH TWICE DAILY.   Misc Natural Products (TART CHERRY ADVANCED PO) Take 425 mg by mouth. 2 daily   Omega-3 Fatty Acids (FISH OIL) 1000 MG CAPS Take by  mouth daily.   pantoprazole (PROTONIX) 40 MG tablet Take 1 tablet (40 mg total) by mouth daily.   polyethylene glycol (MIRALAX / GLYCOLAX) 17 g packet Take 17 g by mouth as needed.   tiZANidine (ZANAFLEX) 2 MG tablet    No current facility-administered medications for this visit. (Other)      REVIEW OF SYSTEMS:    ALLERGIES Allergies  Allergen Reactions   Clindamycin Diarrhea   Gluten Meal Diarrhea    No rye; no barley = HAS CELIAC DISEASE   Hydrocodone Other (See Comments)    Caused mental status changes and suffered withdrawals when he stopped it   Penicillins Other (See Comments)    From childhood: Has patient had a PCN reaction causing immediate rash, facial/tongue/throat swelling, SOB or lightheadedness with hypotension: Unk Has patient had a PCN reaction causing severe rash involving mucus membranes or skin necrosis: Unk Has patient had a PCN reaction that required  hospitalization: Unk Has patient had a PCN reaction occurring within the last 10 years: No If all of the above answers are "NO", then may proceed with Cephalosporin use.    Procaine Other (See Comments)    "Passes out"   Sulfonamide Derivatives Hives   Tape Other (See Comments)    PLEASE USE AN ALTERNATIVE; LIKE COBAN WRAP; TAPE TEARS THE SKIN AND CAUSES BLISTERS!! PLEASE USE AN ALTERNATIVE; LIKE COBAN WRAP; TAPE TEARS THE SKIN AND CAUSES BLISTERS!!   Wheat Bran Diarrhea    Pt family states pt has celiac disease   Cephalexin Diarrhea and Nausea And Vomiting    Dry hives, "violent" diarhea   Mold Extract [Trichophyton] Other (See Comments)    Stuffiness, post-nasal drip    PAST MEDICAL HISTORY Past Medical History:  Diagnosis Date   Allergic rhinitis    Aortic stenosis    Asthmatic bronchitis    Celiac disease    Cellulitis    CKD (chronic kidney disease)    Diverticulosis    Fx. left wrist 1987   Gastric polyps    GERD (gastroesophageal reflux disease)    History of pneumonia    Hypertension    Hypothyroidism    Iron deficiency anemia    Melanoma ([facility])    Neuropathy    PAF (paroxysmal atrial fibrillation) ([facility])    Prostate cancer ([facility])    REM sleep behavior disorder    RLS (restless legs syndrome)    Sleep apnea    Spasticity    Stroke ([place]) 09/2017   Tremor    Type 2 diabetes mellitus ([place])    Past Surgical History:  Procedure Laterality Date   APPENDECTOMY  1978   CARPAL TUNNEL RELEASE Left    CATARACT EXTRACTION  03/2011   bilateral    CHOLECYSTECTOMY  2001   COLONOSCOPY W/ BIOPSIES  04/27/2008   diverticulosis, duodenitis   FOOT SURGERY     HERNIA REPAIR  1994   bilateral   KNEE SURGERY Bilateral    x 2   LUNG SURGERY  2001   for infection   PROSTATECTOMY  2002   UPPER GASTROINTESTINAL ENDOSCOPY  04/27/2008   celiac disease, gastric polyps    FAMILY HISTORY Family History  Problem Relation Age of Onset   Breast cancer Mother    Kidney failure  Father    Heart disease Father    Rheumatic fever Father    Colon cancer Other     SOCIAL HISTORY Social History   Tobacco Use   Smoking status: Never   Smokeless  tobacco: Never  Vaping Use   Vaping Use: Never used  Substance Use Topics   Alcohol use: Never   Drug use: Never         OPHTHALMIC EXAM:  Base Eye Exam     Visual Acuity (ETDRS)       Right Left   Dist cc 20/80 -1 20/80 -1   Dist ph cc NI 20/60 -1    Correction: Glasses         Tonometry (Tonopen, 1:10 PM)       Right Left   Pressure 13 14         Pupils       Pupils Dark Light Shape React APD   Right PERRL 5 4 Round Brisk None   Left PERRL 5 4 Round Brisk None         Visual Fields (Counting fingers)       Left Right    Full Full         Extraocular Movement       Right Left    Full, Ortho Full, Ortho         Neuro/Psych     Oriented x3: Yes   Mood/Affect: Normal         Dilation     Both eyes: 1.0% Mydriacyl, 2.5% Phenylephrine @ 1:10 PM           Slit Lamp and Fundus Exam     External Exam       Right Left   External Normal Normal         Slit Lamp Exam       Right Left   Lids/Lashes Normal Normal   Conjunctiva/Sclera White and quiet White and quiet   Cornea Clear Clear   Anterior Chamber Deep and quiet Deep and quiet   Iris Round and reactive Round and reactive   Lens Centered posterior chamber intraocular lens Centered posterior chamber intraocular lens   Anterior Vitreous Normal Normal         Fundus Exam       Right Left   Posterior Vitreous Posterior vitreous detachment Posterior vitreous detachment   Disc Normal Normal   C/D Ratio 0.25 0.4   Macula Geographic atrophy, Hard drusen, Retinal pigment epithelial mottling, no hemorrhage Geographic atrophy, splits the FAZ, Hard drusen, Retinal pigment epithelial mottling, no hemorrhage   Vessels no DR no DR   Periphery Normal Normal            IMAGING AND PROCEDURES  Imaging  and Procedures for 11/18/21  OCT, Retina - OU - Both Eyes       Right Eye Central Foveal Thickness: 230. Progression has been stable. Findings include abnormal foveal contour, retinal drusen , central retinal atrophy, outer retinal atrophy.   Left Eye Central Foveal Thickness: 244. Progression has been stable. Findings include abnormal foveal contour, retinal drusen , outer retinal atrophy, central retinal atrophy.              ASSESSMENT/PLAN:  Advanced nonexudative age-related macular degeneration of right eye without subfoveal involvement The nature of dry age related macular degeneration was discussed with the patient as well as its possible conversion to wet. The results of the AREDS 2 study was discussed with the patient. A diet rich in dark leafy green vegetables was advised and specific recommendations were made regarding supplements with AREDS 2 formulation . Control of hypertension and serum cholesterol may slow the disease. Smoking cessation is mandatory to slow  the disease and diminish the risk of progressing to wet age related macular degeneration. The patient was instructed in the use of an [place] and was told to return immediately for any changes in the Grid. Stressed to the patient do not rub eyes  No sign of wet AMD OU.  I discussed the 2 pending applications with the FDA for approval of the injectable medications to slow the progression of geographic atrophy.  Advanced nonexudative age-related macular degeneration of left eye without subfoveal involvement See comments regarding the right eye, no sign of wet AMD     ICD-10-CM   1. Advanced nonexudative age-related macular degeneration of right eye without subfoveal involvement  H35.3113 OCT, Retina - OU - Both Eyes    2. Advanced nonexudative age-related macular degeneration of left eye without subfoveal involvement  H35.3123 OCT, Retina - OU - Both Eyes      1.  No sign of wet AMD OU.  Progressive  perifoveal atrophy accounts for the blurred vision progression.  2.  Option to treat with GA medications once they are approved, and if they are approved in the coming calendar year  3.  Ophthalmic Meds Ordered this visit:  No orders of the defined types were placed in this encounter.      Return in about 6 months (around 05/18/2022) for DILATE OU, COLOR FP, OCT.  There are no Patient Instructions on file for this visit.   Explained the diagnoses, plan, and follow up with the patient and they expressed understanding.  Patient expressed understanding of the importance of proper follow up care.   Clent Demark Kemiah Booz M.D. Diseases & Surgery of the Retina and Vitreous [facility]ina [facility] Diabetic [place] 11/18/21     Abbreviations: M myopia (nearsighted); A astigmatism; H hyperopia (farsighted); P presbyopia; Mrx spectacle prescription;  CTL contact lenses; OD right eye; OS left eye; OU both eyes  XT exotropia; ET esotropia; PEK punctate epithelial keratitis; PEE punctate epithelial erosions; DES dry eye syndrome; MGD meibomian gland dysfunction; ATs artificial tears; PFAT's preservative free artificial tears; [place] nuclear sclerotic cataract; PSC posterior subcapsular cataract; ERM epi-retinal membrane; PVD posterior vitreous detachment; RD retinal detachment; DM diabetes mellitus; DR diabetic retinopathy; NPDR non-proliferative diabetic retinopathy; PDR proliferative diabetic retinopathy; CSME clinically significant macular edema; DME diabetic macular edema; dbh dot blot hemorrhages; CWS cotton wool spot; POAG primary open angle glaucoma; C/D cup-to-disc ratio; HVF humphrey visual field; GVF goldmann visual field; OCT optical coherence tomography; IOP intraocular pressure; BRVO Branch retinal vein occlusion; CRVO central retinal vein occlusion; CRAO central retinal artery occlusion; BRAO branch retinal artery occlusion; RT retinal tear; SB scleral buckle; PPV pars plana vitrectomy; VH Vitreous hemorrhage;  PRP panretinal laser photocoagulation; IVK intravitreal kenalog; VMT vitreomacular traction; MH Macular hole;  NVD neovascularization of the disc; NVE neovascularization elsewhere; AREDS age related eye disease study; ARMD age related macular degeneration; POAG primary open angle glaucoma; EBMD epithelial/anterior basement membrane dystrophy; ACIOL anterior chamber intraocular lens; IOL intraocular lens; PCIOL posterior chamber intraocular lens; Phaco/IOL phacoemulsification with intraocular lens placement; [place] photorefractive keratectomy; LASIK laser assisted in situ keratomileusis; HTN hypertension; DM diabetes mellitus; COPD chronic obstructive pulmonary disease

## 2021-11-18 NOTE — Assessment & Plan Note (Signed)
See comments regarding the right eye, no sign of wet AMD

## 2021-11-19 ENCOUNTER — Ambulatory Visit (HOSPITAL_COMMUNITY): Payer: PPO

## 2021-11-19 ENCOUNTER — Encounter (HOSPITAL_COMMUNITY): Payer: Self-pay

## 2021-11-19 DIAGNOSIS — R2689 Other abnormalities of gait and mobility: Secondary | ICD-10-CM | POA: Diagnosis not present

## 2021-11-19 DIAGNOSIS — L97811 Non-pressure chronic ulcer of other part of right lower leg limited to breakdown of skin: Secondary | ICD-10-CM

## 2021-11-19 NOTE — Therapy (Signed)
[place], [place], [address] Phone: [phone]   Fax:  [phone]  Wound Care Therapy  Patient Details  Name: [name] MRN: [id number] Date of Birth: Sep 08, 1929 Referring Provider (PT): [name]   Encounter Date: 11/19/2021   PT End of Session - 11/19/21 1739     Visit Number 108    Number of Visits 122    Date for PT Re-Evaluation 01/08/22    Authorization Type [employer] (no auth req, no visit limit)    Progress Note Due on Visit 116    PT Start Time 1450    PT Stop Time 1525    PT Time Calculation (min) 35 min    Activity Tolerance Patient tolerated treatment well    Behavior During Therapy [facility] for tasks assessed/performed             Past Medical History:  Diagnosis Date   Allergic rhinitis    Aortic stenosis    Asthmatic bronchitis    Celiac disease    Cellulitis    CKD (chronic kidney disease)    Diverticulosis    Fx. left wrist 1987   Gastric polyps    GERD (gastroesophageal reflux disease)    History of pneumonia    Hypertension    Hypothyroidism    Iron deficiency anemia    Melanoma ([place])    Neuropathy    PAF (paroxysmal atrial fibrillation) ([place])    Prostate cancer ([place])    REM sleep behavior disorder    RLS (restless legs syndrome)    Sleep apnea    Spasticity    Stroke ([place]) 09/2017   Tremor    Type 2 diabetes mellitus ([place])     Past Surgical History:  Procedure Laterality Date   APPENDECTOMY  1978   CARPAL TUNNEL RELEASE Left    CATARACT EXTRACTION  03/2011   bilateral    CHOLECYSTECTOMY  2001   COLONOSCOPY W/ BIOPSIES  04/27/2008   diverticulosis, duodenitis   FOOT SURGERY     HERNIA REPAIR  1994   bilateral   KNEE SURGERY Bilateral    x 2   LUNG SURGERY  2001   for infection   PROSTATECTOMY  2002   UPPER GASTROINTESTINAL ENDOSCOPY  04/27/2008   celiac disease, gastric polyps    There were no vitals filed for this visit.    Subjective  Assessment - 11/19/21 1730     Subjective Reports some pain superior wound, pain scale 4/10.    Currently in Pain? Yes    Pain Score 4     Pain Location Leg    Pain Orientation Right    Pain Descriptors / Indicators --   "it just hurts"   Pain Type Acute pain    Pain Onset More than a month ago    Pain Frequency Intermittent                       Wound Therapy - 11/19/21 1730     Subjective Reports some pain superior wound, pain scale 4/10.    Patient and Family Stated Goals wound to heal    Date of Onset 09/06/20    Prior Treatments MD and self care    Pain Scale 0-10    Evaluation and Treatment Procedures Explained to Patient/Family Yes    Evaluation and Treatment Procedures agreed to    Wound Properties Date First Assessed: 04/26/21 Time First Assessed: 0962 Wound  Type: Non-pressure wound Location: Leg Location Orientation: Right Wound Description (Comments): superior to original wound Present on Admission: No   Wound Image Images linked: 1    Dressing Type Hydrogel;Santyl;Compression wrap   Vaseline/lotion, Hydrogel, Santyl, profore lite   Dressing Changed Changed    Dressing Status Old drainage    Dressing Change Frequency PRN    Site / Wound Assessment Yellow;Red    % Wound base Red or Granulating 30%    % Wound base Yellow/Fibrinous Exudate 70%    Margins Attached edges (approximated)    Drainage Amount Minimal    Drainage Description Serous    Treatment Cleansed;Debridement (Selective)    Wound Properties Date First Assessed: 12/04/20 Time First Assessed: 1545 Wound Type: Venous stasis ulcer Location: Leg Location Orientation: Right Present on Admission: Yes   Wound Image Images linked: 1    Dressing Type Hydrogel;Santyl;Compression wrap   Vaseline/lotion, Hydrogel, Santyl, profore lite   Dressing Changed Changed    Dressing Status Old drainage    Dressing Change Frequency PRN    Site / Wound Assessment Yellow;Red;Pink    % Wound base Red or Granulating  55%    % Wound base Yellow/Fibrinous Exudate 45%    Margins Attached edges (approximated)    Drainage Amount Minimal    Drainage Description Serous    Treatment Cleansed;Debridement (Selective)    Selective Debridement - Location B wound beds    Selective Debridement - Tools Used Forceps;Scalpel    Selective Debridement - Tissue Removed slough    Wound Therapy - Clinical Statement Pt continues to be sensitive with debridement for superior wound, limiting ability to debride adhere slough.  Inferior wound continues to improve in granulation tissue and approximation.  Skin perimeter irritated with significant amount of dry skin and redness.  Continued with hydrogel, santyl and profore lite for edema control .    Wound Therapy - Functional Problem List gait and balance    Factors Delaying/Impairing Wound Healing Diabetes Mellitus;Vascular compromise;Altered sensation    Wound Therapy - Frequency 2X / week    Wound Plan continue treatment    Dressing  cleanse with wet gauze, lotion, santyl to wounds, 2x2, thicker sockinette followed by  profore lite, #5 netting                       PT Short Term Goals - 11/15/21 1556       PT SHORT TERM GOAL #1   Title Patient will have at least 50% granualtion tissue.    Status Partially Met               PT Long Term Goals - 11/15/21 1556       PT LONG TERM GOAL #1   Title Patient's wound will be healed to reduce the risk of infection.    Status On-going      PT LONG TERM GOAL #2   Title Patient will be able to verbalize completion of daily skin checks to reduce the risk of further wound development.    Status On-going                    Patient will benefit from skilled therapeutic intervention in order to improve the following deficits and impairments:     Visit Diagnosis: Other abnormalities of gait and mobility  Ulcer of right pretibial region, limited to breakdown of skin [facility])     Problem  List Patient Active Problem List   Diagnosis Date  Noted   Advanced nonexudative age-related macular degeneration of right eye without subfoveal involvement 11/15/2020   Advanced nonexudative age-related macular degeneration of left eye without subfoveal involvement 11/15/2020   Diabetes mellitus without complication ([place]) 61/84/8592   Posterior vitreous detachment of both eyes 11/15/2020   Gait disturbance, post-stroke 11/02/2017   Aphasia as late effect of cerebrovascular accident 11/02/2017   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   Aphasia    PAF (paroxysmal atrial fibrillation) ([place])    Diabetes mellitus type 2 in obese ([place])    Benign essential HTN    Hypothyroidism    Hyperlipidemia    Stroke (cerebrum) ([place]) 10/02/2017   Afib ([place]) 01/10/2017   SOB (shortness of breath) 76/39/4320   Acute diastolic CHF (congestive heart failure) ([place]) 01/10/2017   Atrial fibrillation with RVR ([place]) 01/10/2017   Claudication ([place]) 07/25/2016   Paroxysmal atrial fibrillation ([place]) 07/25/2016   Odynophagia 04/10/2014   Allergic rhinitis 03/23/2014   Chronic rhinitis 12/22/2013   Intrinsic asthma 07/15/2013   Constipation 08/22/2011   Obesity 09/20/2009   G E R D 11/29/2007   Celiac disease 11/29/2007   Ihor Austin, LPTA/CLT; CBIS [phone]  Aldona Lento, PTA 11/19/2021, 5:40 PM  [place], [place], [address] Phone: [phone]   Fax:  [phone]  Name: [name] MRN: [id number] Date of Birth: 1929-10-12

## 2021-11-22 ENCOUNTER — Encounter (HOSPITAL_COMMUNITY): Payer: Self-pay

## 2021-11-22 ENCOUNTER — Other Ambulatory Visit: Payer: Self-pay

## 2021-11-22 ENCOUNTER — Ambulatory Visit (HOSPITAL_COMMUNITY): Payer: PPO

## 2021-11-22 DIAGNOSIS — R2689 Other abnormalities of gait and mobility: Secondary | ICD-10-CM | POA: Diagnosis not present

## 2021-11-22 DIAGNOSIS — L97811 Non-pressure chronic ulcer of other part of right lower leg limited to breakdown of skin: Secondary | ICD-10-CM

## 2021-11-22 NOTE — Therapy (Signed)
[place], [place], [address] Phone: [phone]   Fax:  [phone]  Wound Care Therapy  Patient Details  Name: [name] MRN: [id number] Date of Birth: 03/05/1929 Referring Provider (PT): [name]   Encounter Date: 11/22/2021   PT End of Session - 11/22/21 1543     Visit Number 109    Number of Visits 122    Date for PT Re-Evaluation 01/08/22    Authorization Type [employer] (no auth req, no visit limit)    Progress Note Due on Visit 116    PT Start Time 1450    PT Stop Time 1530    PT Time Calculation (min) 40 min    Activity Tolerance Patient tolerated treatment well    Behavior During Therapy [facility] for tasks assessed/performed             Past Medical History:  Diagnosis Date   Allergic rhinitis    Aortic stenosis    Asthmatic bronchitis    Celiac disease    Cellulitis    CKD (chronic kidney disease)    Diverticulosis    Fx. left wrist 1987   Gastric polyps    GERD (gastroesophageal reflux disease)    History of pneumonia    Hypertension    Hypothyroidism    Iron deficiency anemia    Melanoma ([place])    Neuropathy    PAF (paroxysmal atrial fibrillation) ([place])    Prostate cancer ([place])    REM sleep behavior disorder    RLS (restless legs syndrome)    Sleep apnea    Spasticity    Stroke ([place]) 09/2017   Tremor    Type 2 diabetes mellitus ([place])     Past Surgical History:  Procedure Laterality Date   APPENDECTOMY  1978   CARPAL TUNNEL RELEASE Left    CATARACT EXTRACTION  03/2011   bilateral    CHOLECYSTECTOMY  2001   COLONOSCOPY W/ BIOPSIES  04/27/2008   diverticulosis, duodenitis   FOOT SURGERY     HERNIA REPAIR  1994   bilateral   KNEE SURGERY Bilateral    x 2   LUNG SURGERY  2001   for infection   PROSTATECTOMY  2002   UPPER GASTROINTESTINAL ENDOSCOPY  04/27/2008   celiac disease, gastric polyps    There were no vitals filed for this visit.    Subjective  Assessment - 11/22/21 1534     Subjective Pain the same, pain scale 4/10.    Currently in Pain? Yes    Pain Score 4     Pain Orientation Right;Lateral    Pain Descriptors / Indicators --   "it just hurts"   Pain Type Chronic pain    Pain Onset More than a month ago    Pain Frequency Intermittent                       Wound Therapy - 11/22/21 1534     Subjective Pain the same, pain scale 4/10.    Patient and Family Stated Goals wound to heal    Date of Onset 09/06/20    Prior Treatments MD and self care    Pain Scale 0-10    Evaluation and Treatment Procedures Explained to Patient/Family Yes    Evaluation and Treatment Procedures agreed to    Wound Properties Date First Assessed: 04/26/21 Time First Assessed: 2595 Wound Type: Non-pressure wound Location: Leg Location Orientation: Right Wound Description (  Comments): superior to original wound Present on Admission: No   Dressing Type Hydrogel;Santyl;Compression wrap    Dressing Changed Changed    Dressing Status Old drainage    Dressing Change Frequency PRN    Site / Wound Assessment Yellow;Red    % Wound base Red or Granulating 30%    % Wound base Yellow/Fibrinous Exudate 70%   was 100% prior debridement   Margins Attached edges (approximated)    Drainage Amount Minimal    Drainage Description Serous    Treatment Cleansed;Debridement (Selective)    Wound Properties Date First Assessed: 12/04/20 Time First Assessed: 1545 Wound Type: Venous stasis ulcer Location: Leg Location Orientation: Right Present on Admission: Yes   Dressing Type Hydrogel;Santyl;Compression wrap    Dressing Changed Changed    Dressing Status Old drainage    Dressing Change Frequency PRN    Site / Wound Assessment Yellow;Red;Pink    % Wound base Red or Granulating 60%    % Wound base Yellow/Fibrinous Exudate 40%    Margins Attached edges (approximated)    Drainage Amount Minimal    Drainage Description Serous    Treatment  Cleansed;Debridement (Selective)    Selective Debridement - Location B wound beds    Selective Debridement - Tools Used Forceps;Scalpel    Selective Debridement - Tissue Removed slough    Wound Therapy - Clinical Statement Improved granulation tissue following debridement and decreased depth on superior aspect of inferior wound.  Perimeter continue to have pale dry skin.  Application of vaseline and heavy layer of lotion prior profore with soft liner to assist with skin integrity.  No reports of pain at EOS>    Wound Therapy - Functional Problem List gait and balance    Factors Delaying/Impairing Wound Healing Diabetes Mellitus;Vascular compromise;Altered sensation    Wound Therapy - Frequency 2X / week    Wound Plan continue treatment    Dressing  cleanse with wet gauze, lotion, santyl to wounds, 2x2, thicker sockinette followed by  profore lite, #5 netting                       PT Short Term Goals - 11/15/21 1556       PT SHORT TERM GOAL #1   Title Patient will have at least 50% granualtion tissue.    Status Partially Met               PT Long Term Goals - 11/15/21 1556       PT LONG TERM GOAL #1   Title Patient's wound will be healed to reduce the risk of infection.    Status On-going      PT LONG TERM GOAL #2   Title Patient will be able to verbalize completion of daily skin checks to reduce the risk of further wound development.    Status On-going                    Patient will benefit from skilled therapeutic intervention in order to improve the following deficits and impairments:     Visit Diagnosis: Other abnormalities of gait and mobility  Ulcer of right pretibial region, limited to breakdown of skin [facility])     Problem List Patient Active Problem List   Diagnosis Date Noted   Advanced nonexudative age-related macular degeneration of right eye without subfoveal involvement 11/15/2020   Advanced nonexudative age-related macular  degeneration of left eye without subfoveal involvement 11/15/2020   Diabetes mellitus without complication ([place])  11/15/2020   Posterior vitreous detachment of both eyes 11/15/2020   Gait disturbance, post-stroke 11/02/2017   Aphasia as late effect of cerebrovascular accident 11/02/2017   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   Aphasia    PAF (paroxysmal atrial fibrillation) ([place])    Diabetes mellitus type 2 in obese ([place])    Benign essential HTN    Hypothyroidism    Hyperlipidemia    Stroke (cerebrum) ([place]) 10/02/2017   Afib ([place]) 01/10/2017   SOB (shortness of breath) 91/11/8900   Acute diastolic CHF (congestive heart failure) ([place]) 01/10/2017   Atrial fibrillation with RVR ([place]) 01/10/2017   Claudication ([place]) 07/25/2016   Paroxysmal atrial fibrillation ([place]) 07/25/2016   Odynophagia 04/10/2014   Allergic rhinitis 03/23/2014   Chronic rhinitis 12/22/2013   Intrinsic asthma 07/15/2013   Constipation 08/22/2011   Obesity 09/20/2009   G E R D 11/29/2007   Celiac disease 11/29/2007   Ihor Austin, LPTA/CLT; CBIS [phone]  Aldona Lento, PTA 11/22/2021, 3:44 PM  [place], [place], [address] Phone: [phone]   Fax:  [phone]  Name: [name] MRN: [id number] Date of Birth: 1929/08/08

## 2021-11-26 ENCOUNTER — Encounter (HOSPITAL_COMMUNITY): Payer: Self-pay

## 2021-11-26 ENCOUNTER — Other Ambulatory Visit: Payer: Self-pay

## 2021-11-26 ENCOUNTER — Ambulatory Visit (HOSPITAL_COMMUNITY): Payer: PPO

## 2021-11-26 DIAGNOSIS — R2689 Other abnormalities of gait and mobility: Secondary | ICD-10-CM | POA: Diagnosis not present

## 2021-11-26 DIAGNOSIS — L97811 Non-pressure chronic ulcer of other part of right lower leg limited to breakdown of skin: Secondary | ICD-10-CM

## 2021-11-26 NOTE — Therapy (Signed)
[place], [place], [address] Phone: [phone]   Fax:  [phone]  Wound Care Therapy  Patient Details  Name: [name] MRN: [id number] Date of Birth: 11-04-1928 Referring Provider (PT): [name]   Encounter Date: 11/26/2021   PT End of Session - 11/26/21 1557     Visit Number 110    Number of Visits 122    Date for PT Re-Evaluation 01/08/22    Authorization Type [employer] (no auth req, no visit limit)    Progress Note Due on Visit 116    PT Start Time 1450    PT Stop Time 1540    PT Time Calculation (min) 50 min    Activity Tolerance Patient tolerated treatment well    Behavior During Therapy [facility] for tasks assessed/performed             Past Medical History:  Diagnosis Date   Allergic rhinitis    Aortic stenosis    Asthmatic bronchitis    Celiac disease    Cellulitis    CKD (chronic kidney disease)    Diverticulosis    Fx. left wrist 1987   Gastric polyps    GERD (gastroesophageal reflux disease)    History of pneumonia    Hypertension    Hypothyroidism    Iron deficiency anemia    Melanoma ([place])    Neuropathy    PAF (paroxysmal atrial fibrillation) ([place])    Prostate cancer ([place])    REM sleep behavior disorder    RLS (restless legs syndrome)    Sleep apnea    Spasticity    Stroke ([place]) 09/2017   Tremor    Type 2 diabetes mellitus ([place])     Past Surgical History:  Procedure Laterality Date   APPENDECTOMY  1978   CARPAL TUNNEL RELEASE Left    CATARACT EXTRACTION  03/2011   bilateral    CHOLECYSTECTOMY  2001   COLONOSCOPY W/ BIOPSIES  04/27/2008   diverticulosis, duodenitis   FOOT SURGERY     HERNIA REPAIR  1994   bilateral   KNEE SURGERY Bilateral    x 2   LUNG SURGERY  2001   for infection   PROSTATECTOMY  2002   UPPER GASTROINTESTINAL ENDOSCOPY  04/27/2008   celiac disease, gastric polyps    There were no vitals filed for this visit.    Subjective  Assessment - 11/26/21 1551     Subjective Reports soreness on top wound, pain scale 5/10.    Currently in Pain? Yes    Pain Score 5     Pain Location Leg    Pain Orientation Right;Distal;Lateral    Pain Descriptors / Indicators Sore;Burning    Pain Onset More than a month ago    Pain Frequency Intermittent                       Wound Therapy - 11/26/21 1551     Subjective Reports soreness on top wound, pain scale 5/10.    Patient and Family Stated Goals wound to heal    Date of Onset 09/06/20    Prior Treatments MD and self care    Pain Scale 0-10    Pain Type Chronic pain    Evaluation and Treatment Procedures Explained to Patient/Family Yes    Evaluation and Treatment Procedures agreed to    Wound Properties Date First Assessed: 04/26/21 Time First Assessed: 3343 Wound Type: Non-pressure wound Location:  Leg Location Orientation: Right Wound Description (Comments): superior to original wound Present on Admission: No   Wound Image Images linked: 1    Dressing Type Hydrogel;Santyl;Compression wrap    Dressing Changed Changed    Dressing Status Old drainage    Dressing Change Frequency PRN    Site / Wound Assessment Yellow;Red    % Wound base Red or Granulating 30%    % Wound base Yellow/Fibrinous Exudate 70%   following debridement   Wound Length (cm) 2.3 cm    Wound Width (cm) 2 cm    Wound Depth (cm) 0.2 cm    Wound Volume (cm^3) 0.92 cm^3    Wound Surface Area (cm^2) 4.6 cm^2    Margins Attached edges (approximated)    Drainage Amount Minimal    Drainage Description Serous    Treatment Cleansed;Debridement (Selective)    Wound Properties Date First Assessed: 12/04/20 Time First Assessed: 1545 Wound Type: Venous stasis ulcer Location: Leg Location Orientation: Right Present on Admission: Yes   Wound Image Images linked: 1    Dressing Type Hydrogel;Santyl;Compression wrap    Dressing Changed Changed    Dressing Status Old drainage    Dressing Change  Frequency PRN    Site / Wound Assessment Pink;Red    % Wound base Red or Granulating 40%    % Wound base Yellow/Fibrinous Exudate 60%   following debridement   Wound Length (cm) 4.7 cm    Wound Width (cm) 2 cm    Wound Depth (cm) 0.2 cm    Wound Volume (cm^3) 1.88 cm^3    Wound Surface Area (cm^2) 9.4 cm^2    Margins Attached edges (approximated)    Drainage Amount Minimal    Drainage Description Serous    Treatment Cleansed;Debridement (Selective)    Selective Debridement - Location B wound beds    Selective Debridement - Tools Used Forceps;Scalpel    Selective Debridement - Tissue Removed slough    Wound Therapy - Clinical Statement Measurements taken wiht increase in size since last measured.  Wound continue to be 100% eschar prior debridement.  Able to remove significant amount of slough and eschar from inferior wound with improve granulation followind debridement.  Continued wiht santyl and hydrogel wound bed, heavy layer of vaseline perimeter of wound and lotion on whole leg prior applicaiton of profore lite.    Wound Therapy - Functional Problem List gait and balance    Factors Delaying/Impairing Wound Healing Diabetes Mellitus;Vascular compromise;Altered sensation    Wound Therapy - Frequency 2X / week    Wound Plan continue treatment    Dressing  cleanse with wet gauze, lotion, santyl to wounds, 2x2, thicker sockinette followed by  profore lite, #5 netting                       PT Short Term Goals - 11/15/21 1556       PT SHORT TERM GOAL #1   Title Patient will have at least 50% granualtion tissue.    Status Partially Met               PT Long Term Goals - 11/15/21 1556       PT LONG TERM GOAL #1   Title Patient's wound will be healed to reduce the risk of infection.    Status On-going      PT LONG TERM GOAL #2   Title Patient will be able to verbalize completion of daily skin checks to reduce the risk of  further wound development.    Status  On-going                    Patient will benefit from skilled therapeutic intervention in order to improve the following deficits and impairments:     Visit Diagnosis: Other abnormalities of gait and mobility  Ulcer of right pretibial region, limited to breakdown of skin [facility])     Problem List Patient Active Problem List   Diagnosis Date Noted   Advanced nonexudative age-related macular degeneration of right eye without subfoveal involvement 11/15/2020   Advanced nonexudative age-related macular degeneration of left eye without subfoveal involvement 11/15/2020   Diabetes mellitus without complication ([place]) 87/37/3081   Posterior vitreous detachment of both eyes 11/15/2020   Gait disturbance, post-stroke 11/02/2017   Aphasia as late effect of cerebrovascular accident 11/02/2017   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   Aphasia    PAF (paroxysmal atrial fibrillation) ([place])    Diabetes mellitus type 2 in obese ([place])    Benign essential HTN    Hypothyroidism    Hyperlipidemia    Stroke (cerebrum) ([place]) 10/02/2017   Afib ([place]) 01/10/2017   SOB (shortness of breath) 68/38/7065   Acute diastolic CHF (congestive heart failure) ([place]) 01/10/2017   Atrial fibrillation with RVR ([place]) 01/10/2017   Claudication ([place]) 07/25/2016   Paroxysmal atrial fibrillation ([place]) 07/25/2016   Odynophagia 04/10/2014   Allergic rhinitis 03/23/2014   Chronic rhinitis 12/22/2013   Intrinsic asthma 07/15/2013   Constipation 08/22/2011   Obesity 09/20/2009   G E R D 11/29/2007   Celiac disease 11/29/2007   Ihor Austin, LPTA/CLT; CBIS [phone]  Aldona Lento, PTA 11/26/2021, 3:58 PM  [place], [place], [address] Phone: [phone]   Fax:  [phone]  Name: [name] MRN: [id number] Date of Birth: Jan 05, 1929

## 2021-11-29 ENCOUNTER — Other Ambulatory Visit: Payer: Self-pay

## 2021-11-29 ENCOUNTER — Encounter (HOSPITAL_COMMUNITY): Payer: Self-pay | Admitting: Physical Therapy

## 2021-11-29 ENCOUNTER — Ambulatory Visit (HOSPITAL_COMMUNITY): Payer: PPO | Admitting: Physical Therapy

## 2021-11-29 DIAGNOSIS — R2689 Other abnormalities of gait and mobility: Secondary | ICD-10-CM

## 2021-11-29 DIAGNOSIS — L97811 Non-pressure chronic ulcer of other part of right lower leg limited to breakdown of skin: Secondary | ICD-10-CM

## 2021-11-29 NOTE — Therapy (Signed)
[place], [place], [address] Phone: [phone]   Fax:  [phone]  Wound Care Therapy  Patient Details  Name: [name] MRN: [id number] Date of Birth: 06/21/1929 Referring Provider (PT): [name]   Encounter Date: 11/29/2021   PT End of Session - 11/29/21 1546     Visit Number 111    Number of Visits 122    Date for PT Re-Evaluation 01/08/22    Authorization Type [employer] (no auth req, no visit limit)    Progress Note Due on Visit 116    PT Start Time 6384    PT Stop Time 1525    PT Time Calculation (min) 40 min    Activity Tolerance Patient tolerated treatment well    Behavior During Therapy [facility] for tasks assessed/performed             Past Medical History:  Diagnosis Date   Allergic rhinitis    Aortic stenosis    Asthmatic bronchitis    Celiac disease    Cellulitis    CKD (chronic kidney disease)    Diverticulosis    Fx. left wrist 1987   Gastric polyps    GERD (gastroesophageal reflux disease)    History of pneumonia    Hypertension    Hypothyroidism    Iron deficiency anemia    Melanoma ([place])    Neuropathy    PAF (paroxysmal atrial fibrillation) ([place])    Prostate cancer ([place])    REM sleep behavior disorder    RLS (restless legs syndrome)    Sleep apnea    Spasticity    Stroke ([place]) 09/2017   Tremor    Type 2 diabetes mellitus ([place])     Past Surgical History:  Procedure Laterality Date   APPENDECTOMY  1978   CARPAL TUNNEL RELEASE Left    CATARACT EXTRACTION  03/2011   bilateral    CHOLECYSTECTOMY  2001   COLONOSCOPY W/ BIOPSIES  04/27/2008   diverticulosis, duodenitis   FOOT SURGERY     HERNIA REPAIR  1994   bilateral   KNEE SURGERY Bilateral    x 2   LUNG SURGERY  2001   for infection   PROSTATECTOMY  2002   UPPER GASTROINTESTINAL ENDOSCOPY  04/27/2008   celiac disease, gastric polyps    There were no vitals filed for this  visit.               Wound Therapy - 11/29/21 0001     Subjective Pt has no complaints states he was put on another antibiotic from his dentist    Patient and Family Stated Goals wound to heal    Date of Onset 09/06/20    Prior Treatments MD and self care    Pain Scale 0-10    Pain Score 0-No pain    Evaluation and Treatment Procedures Explained to Patient/Family Yes    Evaluation and Treatment Procedures agreed to    Wound Properties Date First Assessed: 04/26/21 Time First Assessed: 5364 Wound Type: Non-pressure wound Location: Leg Location Orientation: Right Wound Description (Comments): superior to original wound Present on Admission: No   Dressing Type Hydrogel;Santyl;Compression wrap    Dressing Changed Changed    Dressing Status Old drainage    Dressing Change Frequency PRN    Site / Wound Assessment Yellow;Red    % Wound base Red or Granulating 35%    % Wound base Yellow/Fibrinous Exudate 65%    Wound Length (  cm) 2.3 cm    Wound Width (cm) 2 cm    Wound Surface Area (cm^2) 4.6 cm^2    Margins Attached edges (approximated)    Drainage Amount Minimal    Drainage Description Serous    Treatment Cleansed;Debridement (Selective)    Wound Properties Date First Assessed: 12/04/20 Time First Assessed: 8325 Wound Type: Venous stasis ulcer Location: Leg Location Orientation: Right Present on Admission: Yes   Dressing Type Hydrogel;Santyl;Compression wrap    Dressing Changed Changed    Dressing Status Old drainage    Dressing Change Frequency PRN    Site / Wound Assessment Pink;Red    % Wound base Red or Granulating 50%   following debridement   % Wound base Yellow/Fibrinous Exudate 50%    Wound Length (cm) 4.6 cm    Wound Width (cm) 2 cm    Wound Surface Area (cm^2) 9.2 cm^2    Margins Attached edges (approximated)    Drainage Amount Minimal    Drainage Description Serous    Treatment Cleansed;Debridement (Selective)    Selective Debridement - Location B wound beds     Selective Debridement - Tools Used Forceps;Scalpel    Selective Debridement - Tissue Removed slough    Wound Therapy - Clinical Statement Pt wounds able to be debrided with improved granulation however size remains the same.  LE cleansed and well moisturized following debridement.  Pt will contiue to benefit from skilled PT to create a healing environment.    Wound Therapy - Functional Problem List gait and balance    Factors Delaying/Impairing Wound Healing Diabetes Mellitus;Vascular compromise;Altered sensation    Wound Therapy - Frequency 2X / week    Wound Plan continue treatment    Dressing  cleanse with wet gauze, lotion, santyl to wounds, 2x2, thicker sockinette followed by  profore lite, #5 netting                       PT Short Term Goals - 11/15/21 1556       PT SHORT TERM GOAL #1   Title Patient will have at least 50% granualtion tissue.    Status Partially Met               PT Long Term Goals - 11/15/21 1556       PT LONG TERM GOAL #1   Title Patient's wound will be healed to reduce the risk of infection.    Status On-going      PT LONG TERM GOAL #2   Title Patient will be able to verbalize completion of daily skin checks to reduce the risk of further wound development.    Status On-going                   Plan - 11/29/21 1546     Clinical Impression Statement see above    Personal Factors and Comorbidities Age;Comorbidity 3+;Past/Current Experience;Fitness;Time since onset of injury/illness/exacerbation    Comorbidities DM, CKD, neuorpathy, Hx CVA    Examination-Activity Limitations Locomotion Level;Transfers;Squat;Stand;Dressing    Examination-Participation Restrictions Community Activity    Stability/Clinical Decision Making Stable/Uncomplicated    Rehab Potential Good    PT Frequency 2x / week    PT Duration 6 weeks    PT Treatment/Interventions Gait training;Stair training;Functional mobility training;DME  Instruction;Therapeutic activities;Therapeutic exercise;Balance training;Neuromuscular re-education;Patient/family education;Compression bandaging;Manual lymph drainage;Manual techniques    PT Next Visit Plan Continue with both chemical and mechanical debridement to remove adherent eschar to allow healing to occur  Consulted and Agree with Plan of Care Patient;Family member/caregiver    Family Member Consulted wife             Patient will benefit from skilled therapeutic intervention in order to improve the following deficits and impairments:  Abnormal gait, Decreased skin integrity, Decreased mobility, Impaired sensation  Visit Diagnosis: Other abnormalities of gait and mobility  Ulcer of right pretibial region, limited to breakdown of skin [facility])     Problem List Patient Active Problem List   Diagnosis Date Noted   Advanced nonexudative age-related macular degeneration of right eye without subfoveal involvement 11/15/2020   Advanced nonexudative age-related macular degeneration of left eye without subfoveal involvement 11/15/2020   Diabetes mellitus without complication ([place]) 73/71/0626   Posterior vitreous detachment of both eyes 11/15/2020   Gait disturbance, post-stroke 11/02/2017   Aphasia as late effect of cerebrovascular accident 11/02/2017   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   Aphasia    PAF (paroxysmal atrial fibrillation) ([place])    Diabetes mellitus type 2 in obese ([place])    Benign essential HTN    Hypothyroidism    Hyperlipidemia    Stroke (cerebrum) ([place]) 10/02/2017   Afib ([place]) 01/10/2017   SOB (shortness of breath) 94/85/4627   Acute diastolic CHF (congestive heart failure) ([place]) 01/10/2017   Atrial fibrillation with RVR ([place]) 01/10/2017   Claudication ([place]) 07/25/2016   Paroxysmal atrial fibrillation ([place]) 07/25/2016   Odynophagia 04/10/2014   Allergic rhinitis 03/23/2014   Chronic rhinitis 12/22/2013   Intrinsic asthma 07/15/2013    Constipation 08/22/2011   Obesity 09/20/2009   G E R D 11/29/2007   Celiac disease 11/29/2007   Rayetta Humphrey, PT CLT [phone]  11/29/2021, 3:47 PM  [facility] [address] [place], [place], [address] Phone: [phone]   Fax:  [phone]  Name: AARIN BLUETT MRN: [id number] Date of Birth: 19-Jan-1929

## 2021-12-03 ENCOUNTER — Other Ambulatory Visit: Payer: Self-pay

## 2021-12-03 ENCOUNTER — Ambulatory Visit (HOSPITAL_COMMUNITY): Payer: PPO

## 2021-12-03 ENCOUNTER — Encounter (HOSPITAL_COMMUNITY): Payer: Self-pay

## 2021-12-03 DIAGNOSIS — L97811 Non-pressure chronic ulcer of other part of right lower leg limited to breakdown of skin: Secondary | ICD-10-CM

## 2021-12-03 DIAGNOSIS — R2689 Other abnormalities of gait and mobility: Secondary | ICD-10-CM

## 2021-12-03 NOTE — Therapy (Signed)
[place], [place], [address] Phone: [phone]   Fax:  [phone]  Wound Care Therapy  Patient Details  Name: RAMARI BRAY MRN: [id number] Date of Birth: 09-07-1929 Referring Provider (PT): [name]   Encounter Date: 12/03/2021   PT End of Session - 12/03/21 1545     Visit Number 112    Number of Visits 122    Date for PT Re-Evaluation 01/08/22    Authorization Type [employer] (no auth req, no visit limit)    Progress Note Due on Visit 116    PT Start Time 8466    PT Stop Time 1530    PT Time Calculation (min) 39 min    Activity Tolerance Patient tolerated treatment well    Behavior During Therapy [facility] for tasks assessed/performed             Past Medical History:  Diagnosis Date   Allergic rhinitis    Aortic stenosis    Asthmatic bronchitis    Celiac disease    Cellulitis    CKD (chronic kidney disease)    Diverticulosis    Fx. left wrist 1987   Gastric polyps    GERD (gastroesophageal reflux disease)    History of pneumonia    Hypertension    Hypothyroidism    Iron deficiency anemia    Melanoma ([place])    Neuropathy    PAF (paroxysmal atrial fibrillation) ([place])    Prostate cancer ([place])    REM sleep behavior disorder    RLS (restless legs syndrome)    Sleep apnea    Spasticity    Stroke ([place]) 09/2017   Tremor    Type 2 diabetes mellitus ([place])     Past Surgical History:  Procedure Laterality Date   APPENDECTOMY  1978   CARPAL TUNNEL RELEASE Left    CATARACT EXTRACTION  03/2011   bilateral    CHOLECYSTECTOMY  2001   COLONOSCOPY W/ BIOPSIES  04/27/2008   diverticulosis, duodenitis   FOOT SURGERY     HERNIA REPAIR  1994   bilateral   KNEE SURGERY Bilateral    x 2   LUNG SURGERY  2001   for infection   PROSTATECTOMY  2002   UPPER GASTROINTESTINAL ENDOSCOPY  04/27/2008   celiac disease, gastric polyps    There were no vitals filed for this visit.    Subjective  Assessment - 12/03/21 1548     Subjective Reports intermittent soreness on superior wound, pain scale 3/10.    Currently in Pain? Yes    Pain Score 3     Pain Location Leg    Pain Orientation Right;Distal;Lateral    Pain Descriptors / Indicators Sore;Burning    Pain Type Chronic pain    Pain Onset More than a month ago    Pain Frequency Intermittent                       Wound Therapy - 12/03/21 1548     Subjective Reports intermittent soreness on superior wound, pain scale 3/10.    Patient and Family Stated Goals wound to heal    Date of Onset 09/06/20    Prior Treatments MD and self care    Pain Scale 0-10    Evaluation and Treatment Procedures Explained to Patient/Family Yes    Evaluation and Treatment Procedures agreed to    Wound Properties Date First Assessed: 04/26/21 Time First Assessed: 5993 Wound Type: Non-pressure  wound Location: Leg Location Orientation: Right Wound Description (Comments): superior to original wound Present on Admission: No   Wound Image Images linked: 1    Dressing Type Hydrogel;Santyl;Compression wrap    Dressing Changed Changed    Dressing Status Old drainage    Dressing Change Frequency PRN    Site / Wound Assessment Yellow;Red    % Wound base Red or Granulating 35%    % Wound base Yellow/Fibrinous Exudate 65%    Margins Attached edges (approximated)    Drainage Amount Minimal    Drainage Description Serous    Treatment Cleansed;Debridement (Selective)    Wound Properties Date First Assessed: 12/04/20 Time First Assessed: 1545 Wound Type: Venous stasis ulcer Location: Leg Location Orientation: Right Present on Admission: Yes   Wound Image Images linked: 1    Dressing Type Hydrogel;Santyl;Compression wrap    Dressing Changed Changed    Dressing Status Old drainage    Dressing Change Frequency PRN    Site / Wound Assessment Pink;Red    % Wound base Red or Granulating 55%    % Wound base Yellow/Fibrinous Exudate 45%    Margins  Attached edges (approximated)    Drainage Amount Minimal    Drainage Description Serous    Treatment Cleansed;Debridement (Selective)    Selective Debridement - Location B wound beds    Selective Debridement - Tools Used Forceps;Scalpel    Selective Debridement - Tissue Removed slough    Wound Therapy - Clinical Statement Both wound continue to improve with granulation following debridement, increased ease today.  Noted adherent slough is thinner layer as well.  Continued wiht Santyl and profore lite with reports of comfort at EOS.    Wound Therapy - Functional Problem List gait and balance    Factors Delaying/Impairing Wound Healing Diabetes Mellitus;Vascular compromise;Altered sensation    Wound Therapy - Frequency 2X / week    Wound Plan continue treatment    Dressing  cleanse with wet gauze, lotion, santyl to wounds, 2x2, thicker sockinette followed by  profore lite, #5 netting                       PT Short Term Goals - 11/15/21 1556       PT SHORT TERM GOAL #1   Title Patient will have at least 50% granualtion tissue.    Status Partially Met               PT Long Term Goals - 11/15/21 1556       PT LONG TERM GOAL #1   Title Patient's wound will be healed to reduce the risk of infection.    Status On-going      PT LONG TERM GOAL #2   Title Patient will be able to verbalize completion of daily skin checks to reduce the risk of further wound development.    Status On-going                    Patient will benefit from skilled therapeutic intervention in order to improve the following deficits and impairments:     Visit Diagnosis: Other abnormalities of gait and mobility  Ulcer of right pretibial region, limited to breakdown of skin [facility])     Problem List Patient Active Problem List   Diagnosis Date Noted   Advanced nonexudative age-related macular degeneration of right eye without subfoveal involvement 11/15/2020   Advanced  nonexudative age-related macular degeneration of left eye without subfoveal involvement 11/15/2020  Diabetes mellitus without complication ([place]) 38/07/1750   Posterior vitreous detachment of both eyes 11/15/2020   Gait disturbance, post-stroke 11/02/2017   Aphasia as late effect of cerebrovascular accident 11/02/2017   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   Aphasia    PAF (paroxysmal atrial fibrillation) ([place])    Diabetes mellitus type 2 in obese ([place])    Benign essential HTN    Hypothyroidism    Hyperlipidemia    Stroke (cerebrum) ([place]) 10/02/2017   Afib ([place]) 01/10/2017   SOB (shortness of breath) 02/58/5277   Acute diastolic CHF (congestive heart failure) ([place]) 01/10/2017   Atrial fibrillation with RVR ([place]) 01/10/2017   Claudication ([place]) 07/25/2016   Paroxysmal atrial fibrillation ([place]) 07/25/2016   Odynophagia 04/10/2014   Allergic rhinitis 03/23/2014   Chronic rhinitis 12/22/2013   Intrinsic asthma 07/15/2013   Constipation 08/22/2011   Obesity 09/20/2009   G E R D 11/29/2007   Celiac disease 11/29/2007   Ihor Austin, LPTA/CLT; CBIS [phone]  Aldona Lento, PTA 12/03/2021, 3:56 PM  [place], [place], [address] Phone: [phone]   Fax:  [phone]  Name: GENTLE HOGE MRN: [id number] Date of Birth: 30-Aug-1929

## 2021-12-04 ENCOUNTER — Other Ambulatory Visit (HOSPITAL_COMMUNITY): Payer: PPO

## 2021-12-04 DIAGNOSIS — E118 Type 2 diabetes mellitus with unspecified complications: Secondary | ICD-10-CM | POA: Diagnosis not present

## 2021-12-04 DIAGNOSIS — E782 Mixed hyperlipidemia: Secondary | ICD-10-CM | POA: Diagnosis not present

## 2021-12-04 DIAGNOSIS — E039 Hypothyroidism, unspecified: Secondary | ICD-10-CM | POA: Diagnosis not present

## 2021-12-05 ENCOUNTER — Ambulatory Visit (INDEPENDENT_AMBULATORY_CARE_PROVIDER_SITE_OTHER): Payer: PPO

## 2021-12-05 ENCOUNTER — Other Ambulatory Visit: Payer: Self-pay

## 2021-12-05 DIAGNOSIS — I350 Nonrheumatic aortic (valve) stenosis: Secondary | ICD-10-CM | POA: Diagnosis not present

## 2021-12-05 LAB — ECHOCARDIOGRAM COMPLETE
AR max vel: 0.9 cm2
AV Area VTI: 1.03 cm2
AV Area mean vel: 0.84 cm2
AV Mean grad: 40 mmHg
AV Peak grad: 57.5 mmHg
AV Vena cont: 0.49 cm
Ao pk vel: 3.79 m/s
Area-P 1/2: 1.78 cm2
Calc EF: 64.6 %
P 1/2 time: 450 msec
S' Lateral: 3.92 cm
Single Plane A2C EF: 57.4 %
Single Plane A4C EF: 70.3 %

## 2021-12-06 ENCOUNTER — Ambulatory Visit (HOSPITAL_COMMUNITY): Payer: PPO | Admitting: Physical Therapy

## 2021-12-06 ENCOUNTER — Encounter (HOSPITAL_COMMUNITY): Payer: Self-pay | Admitting: Physical Therapy

## 2021-12-06 DIAGNOSIS — L97811 Non-pressure chronic ulcer of other part of right lower leg limited to breakdown of skin: Secondary | ICD-10-CM

## 2021-12-06 DIAGNOSIS — R2689 Other abnormalities of gait and mobility: Secondary | ICD-10-CM | POA: Diagnosis not present

## 2021-12-06 NOTE — Therapy (Signed)
[place], [place], [address] Phone: [phone]   Fax:  [phone]  Wound Care Therapy  Patient Details  Name: [name] MRN: [id number] Date of Birth: 1929/02/06 Referring Provider (PT): [name]   Encounter Date: 12/06/2021   PT End of Session - 12/06/21 1526     Visit Number 112    Number of Visits 122    Date for PT Re-Evaluation 01/08/22    Authorization Type [employer] (no auth req, no visit limit)    Progress Note Due on Visit 116    PT Start Time 2706    PT Stop Time 1520    PT Time Calculation (min) 35 min    Activity Tolerance Patient tolerated treatment well    Behavior During Therapy [facility] for tasks assessed/performed             Past Medical History:  Diagnosis Date   Allergic rhinitis    Aortic stenosis    Asthmatic bronchitis    Celiac disease    Cellulitis    CKD (chronic kidney disease)    Diverticulosis    Fx. left wrist 1987   Gastric polyps    GERD (gastroesophageal reflux disease)    History of pneumonia    Hypertension    Hypothyroidism    Iron deficiency anemia    Melanoma ([place])    Neuropathy    PAF (paroxysmal atrial fibrillation) ([place])    Prostate cancer ([place])    REM sleep behavior disorder    RLS (restless legs syndrome)    Sleep apnea    Spasticity    Stroke ([place]) 09/2017   Tremor    Type 2 diabetes mellitus ([place])     Past Surgical History:  Procedure Laterality Date   APPENDECTOMY  1978   CARPAL TUNNEL RELEASE Left    CATARACT EXTRACTION  03/2011   bilateral    CHOLECYSTECTOMY  2001   COLONOSCOPY W/ BIOPSIES  04/27/2008   diverticulosis, duodenitis   FOOT SURGERY     HERNIA REPAIR  1994   bilateral   KNEE SURGERY Bilateral    x 2   LUNG SURGERY  2001   for infection   PROSTATECTOMY  2002   UPPER GASTROINTESTINAL ENDOSCOPY  04/27/2008   celiac disease, gastric polyps    There were no vitals filed for this  visit.               Wound Therapy - 12/06/21 0001     Subjective No complaint of pain today    Patient and Family Stated Goals wound to heal    Date of Onset 09/06/20    Prior Treatments MD and self care    Pain Scale 0-10    Pain Score 0-No pain    Evaluation and Treatment Procedures Explained to Patient/Family Yes    Evaluation and Treatment Procedures agreed to    Wound Properties Date First Assessed: 04/26/21 Time First Assessed: 2376 Wound Type: Non-pressure wound Location: Leg Location Orientation: Right Wound Description (Comments): superior to original wound Present on Admission: No   Dressing Type Hydrogel;Santyl;Compression wrap    Dressing Changed Changed    Dressing Status Old drainage    Dressing Change Frequency PRN    Site / Wound Assessment Yellow;Red    % Wound base Red or Granulating 45%   following debridement.   % Wound base Yellow/Fibrinous Exudate 55%    Wound Length (cm) 2.3 cm  Wound Width (cm) 2 cm    Wound Surface Area (cm^2) 4.6 cm^2    Margins Attached edges (approximated)    Drainage Amount Minimal    Drainage Description Serous    Treatment Cleansed;Debridement (Selective)    Wound Properties Date First Assessed: 12/04/20 Time First Assessed: 1478 Wound Type: Venous stasis ulcer Location: Leg Location Orientation: Right Present on Admission: Yes   Dressing Type Hydrogel;Santyl;Compression wrap    Dressing Changed Changed    Dressing Status Old drainage    Dressing Change Frequency PRN    Site / Wound Assessment Pink;Red    % Wound base Red or Granulating 70%   following debridement   % Wound base Yellow/Fibrinous Exudate 30%    Wound Length (cm) 4.3 cm    Wound Width (cm) 2 cm    Wound Surface Area (cm^2) 8.6 cm^2    Margins Attached edges (approximated)    Drainage Amount Minimal    Drainage Description Serous    Treatment Cleansed;Debridement (Selective)    Selective Debridement - Location B wound beds    Selective Debridement  - Tools Used Forceps;Scalpel    Selective Debridement - Tissue Removed slough    Wound Therapy - Clinical Statement Both wound continue to improve with granulation following debridement, increased ease today.  Noted adherent slough is thinner layer as well.  Continued wiht Santyl and profore lite with reports of comfort at EOS.    Wound Therapy - Functional Problem List gait and balance    Factors Delaying/Impairing Wound Healing Diabetes Mellitus;Vascular compromise;Altered sensation    Wound Therapy - Frequency 2X / week    Wound Plan continue treatment    Dressing  cleanse with wet gauze, lotion, santyl to wounds, 2x2, thicker sockinette followed by  profore lite, #5 netting                       PT Short Term Goals - 12/06/21 1527       PT SHORT TERM GOAL #1   Title Patient will have at least 50% granualtion tissue.    Status Achieved               PT Long Term Goals - 12/06/21 1527       PT LONG TERM GOAL #1   Title Patient's wound will be healed to reduce the risk of infection.    Status On-going      PT LONG TERM GOAL #2   Title Patient will be able to verbalize completion of daily skin checks to reduce the risk of further wound development.    Status On-going                   Plan - 12/06/21 1527     Clinical Impression Statement as above    Personal Factors and Comorbidities Age;Comorbidity 3+;Past/Current Experience;Fitness;Time since onset of injury/illness/exacerbation    Comorbidities DM, CKD, neuorpathy, Hx CVA    Examination-Activity Limitations Locomotion Level;Transfers;Squat;Stand;Dressing    Examination-Participation Restrictions Community Activity    Stability/Clinical Decision Making Stable/Uncomplicated    Rehab Potential Good    PT Frequency 2x / week    PT Duration 6 weeks    PT Treatment/Interventions Gait training;Stair training;Functional mobility training;DME Instruction;Therapeutic activities;Therapeutic  exercise;Balance training;Neuromuscular re-education;Patient/family education;Compression bandaging;Manual lymph drainage;Manual techniques    PT Next Visit Plan Continue with both chemical and mechanical debridement to remove adherent eschar to allow healing to occur    Consulted and Agree with Plan of Care  Patient;Family member/caregiver    Family Member Consulted wife             Patient will benefit from skilled therapeutic intervention in order to improve the following deficits and impairments:  Abnormal gait, Decreased skin integrity, Decreased mobility, Impaired sensation  Visit Diagnosis: Other abnormalities of gait and mobility  Ulcer of right pretibial region, limited to breakdown of skin [facility])     Problem List Patient Active Problem List   Diagnosis Date Noted   Advanced nonexudative age-related macular degeneration of right eye without subfoveal involvement 11/15/2020   Advanced nonexudative age-related macular degeneration of left eye without subfoveal involvement 11/15/2020   Diabetes mellitus without complication ([place]) 23/53/6144   Posterior vitreous detachment of both eyes 11/15/2020   Gait disturbance, post-stroke 11/02/2017   Aphasia as late effect of cerebrovascular accident 11/02/2017   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   Aphasia    PAF (paroxysmal atrial fibrillation) ([place])    Diabetes mellitus type 2 in obese ([place])    Benign essential HTN    Hypothyroidism    Hyperlipidemia    Stroke (cerebrum) ([place]) 10/02/2017   Afib ([place]) 01/10/2017   SOB (shortness of breath) 31/54/0086   Acute diastolic CHF (congestive heart failure) ([place]) 01/10/2017   Atrial fibrillation with RVR ([place]) 01/10/2017   Claudication ([place]) 07/25/2016   Paroxysmal atrial fibrillation ([place]) 07/25/2016   Odynophagia 04/10/2014   Allergic rhinitis 03/23/2014   Chronic rhinitis 12/22/2013   Intrinsic asthma 07/15/2013   Constipation 08/22/2011   Obesity 09/20/2009    G E R D 11/29/2007   Celiac disease 11/29/2007   Rayetta Humphrey, PT CLT [phone]  12/06/2021, 3:28 PM  [place], [place], [address] Phone: [phone]   Fax:  [phone]  Name: MATTOX SCHORR MRN: [id number] Date of Birth: [birth year]

## 2021-12-10 ENCOUNTER — Ambulatory Visit (HOSPITAL_COMMUNITY): Payer: PPO

## 2021-12-10 ENCOUNTER — Encounter (HOSPITAL_COMMUNITY): Payer: Self-pay

## 2021-12-10 ENCOUNTER — Other Ambulatory Visit: Payer: Self-pay

## 2021-12-10 DIAGNOSIS — E119 Type 2 diabetes mellitus without complications: Secondary | ICD-10-CM | POA: Diagnosis not present

## 2021-12-10 DIAGNOSIS — E669 Obesity, unspecified: Secondary | ICD-10-CM | POA: Diagnosis not present

## 2021-12-10 DIAGNOSIS — R2689 Other abnormalities of gait and mobility: Secondary | ICD-10-CM | POA: Diagnosis not present

## 2021-12-10 DIAGNOSIS — E875 Hyperkalemia: Secondary | ICD-10-CM | POA: Diagnosis not present

## 2021-12-10 DIAGNOSIS — I69959 Hemiplegia and hemiparesis following unspecified cerebrovascular disease affecting unspecified side: Secondary | ICD-10-CM | POA: Diagnosis not present

## 2021-12-10 DIAGNOSIS — I482 Chronic atrial fibrillation, unspecified: Secondary | ICD-10-CM | POA: Diagnosis not present

## 2021-12-10 DIAGNOSIS — N1832 Chronic kidney disease, stage 3b: Secondary | ICD-10-CM | POA: Diagnosis not present

## 2021-12-10 DIAGNOSIS — E118 Type 2 diabetes mellitus with unspecified complications: Secondary | ICD-10-CM | POA: Diagnosis not present

## 2021-12-10 DIAGNOSIS — I5032 Chronic diastolic (congestive) heart failure: Secondary | ICD-10-CM | POA: Diagnosis not present

## 2021-12-10 DIAGNOSIS — E782 Mixed hyperlipidemia: Secondary | ICD-10-CM | POA: Diagnosis not present

## 2021-12-10 DIAGNOSIS — E039 Hypothyroidism, unspecified: Secondary | ICD-10-CM | POA: Diagnosis not present

## 2021-12-10 DIAGNOSIS — L97811 Non-pressure chronic ulcer of other part of right lower leg limited to breakdown of skin: Secondary | ICD-10-CM

## 2021-12-10 DIAGNOSIS — D638 Anemia in other chronic diseases classified elsewhere: Secondary | ICD-10-CM | POA: Diagnosis not present

## 2021-12-10 DIAGNOSIS — K900 Celiac disease: Secondary | ICD-10-CM | POA: Diagnosis not present

## 2021-12-10 DIAGNOSIS — I10 Essential (primary) hypertension: Secondary | ICD-10-CM | POA: Diagnosis not present

## 2021-12-10 DIAGNOSIS — I129 Hypertensive chronic kidney disease with stage 1 through stage 4 chronic kidney disease, or unspecified chronic kidney disease: Secondary | ICD-10-CM | POA: Diagnosis not present

## 2021-12-10 NOTE — Therapy (Signed)
[place], [place], [address] Phone: [phone]   Fax:  [phone]  Wound Care Therapy  Patient Details  Name: [name] MRN: [id number] Date of Birth: [birth year] Referring Provider (PT): [name]   Encounter Date: 12/10/2021   PT End of Session - 12/10/21 1452     Visit Number 114    Number of Visits 122    Date for PT Re-Evaluation 01/08/22    Authorization Type [employer] (no auth req, no visit limit)    Progress Note Due on Visit 116    PT Start Time 0258    PT Stop Time 1440    PT Time Calculation (min) 42 min    Activity Tolerance Patient tolerated treatment well    Behavior During Therapy [facility] for tasks assessed/performed             Past Medical History:  Diagnosis Date   Allergic rhinitis    Aortic stenosis    Asthmatic bronchitis    Celiac disease    Cellulitis    CKD (chronic kidney disease)    Diverticulosis    Fx. left wrist 1987   Gastric polyps    GERD (gastroesophageal reflux disease)    History of pneumonia    Hypertension    Hypothyroidism    Iron deficiency anemia    Melanoma ([place])    Neuropathy    PAF (paroxysmal atrial fibrillation) ([place])    Prostate cancer ([place])    REM sleep behavior disorder    RLS (restless legs syndrome)    Sleep apnea    Spasticity    Stroke ([place]) 09/2017   Tremor    Type 2 diabetes mellitus ([place])     Past Surgical History:  Procedure Laterality Date   APPENDECTOMY  1978   CARPAL TUNNEL RELEASE Left    CATARACT EXTRACTION  03/2011   bilateral    CHOLECYSTECTOMY  2001   COLONOSCOPY W/ BIOPSIES  04/27/2008   diverticulosis, duodenitis   FOOT SURGERY     HERNIA REPAIR  1994   bilateral   KNEE SURGERY Bilateral    x 2   LUNG SURGERY  2001   for infection   PROSTATECTOMY  2002   UPPER GASTROINTESTINAL ENDOSCOPY  04/27/2008   celiac disease, gastric polyps    There were no vitals filed for this visit.    Subjective  Assessment - 12/10/21 1444     Subjective Feeling good today, no reports of pain.  Arrived wiht dressings intact.    Currently in Pain? No/denies                       Wound Therapy - 12/10/21 0001     Subjective Feeling good today, no reports of pain.  Arrived wiht dressings intact.    Patient and Family Stated Goals wound to heal    Date of Onset 09/06/20    Prior Treatments MD and self care    Pain Scale 0-10    Pain Score 0-No pain    Evaluation and Treatment Procedures Explained to Patient/Family Yes    Evaluation and Treatment Procedures agreed to    Wound Properties Date First Assessed: 04/26/21 Time First Assessed: 5277 Wound Type: Non-pressure wound Location: Leg Location Orientation: Right Wound Description (Comments): superior to original wound Present on Admission: No   Dressing Type Hydrogel;Santyl;Compression wrap    Dressing Changed Changed    Dressing Status Old drainage  Dressing Change Frequency PRN    Site / Wound Assessment Yellow;Red    % Wound base Red or Granulating 45%   following debridement   % Wound base Yellow/Fibrinous Exudate 55%    Margins Attached edges (approximated)    Drainage Amount Minimal    Drainage Description Serous    Treatment Cleansed;Debridement (Selective)    Wound Properties Date First Assessed: 12/04/20 Time First Assessed: 1545 Wound Type: Venous stasis ulcer Location: Leg Location Orientation: Right Present on Admission: Yes   Dressing Type Hydrogel;Santyl;Compression wrap    Dressing Changed Changed    Dressing Status Old drainage    Dressing Change Frequency PRN    Site / Wound Assessment Pink;Red    % Wound base Red or Granulating 70%   following debridment   % Wound base Yellow/Fibrinous Exudate 30%    Margins Attached edges (approximated)    Drainage Amount Minimal    Drainage Description Serous    Treatment Cleansed;Debridement (Selective)    Selective Debridement - Location B wound beds    Selective  Debridement - Tools Used Forceps;Scalpel    Selective Debridement - Tissue Removed slough    Wound Therapy - Clinical Statement Wound continue to improve granulation tissue following selective debridement.  Adherent slough does continue to wound bed though is thinner.  Continued wiht santyl, hydrogel and profore lite for edema control.    Wound Therapy - Functional Problem List gait and balance    Factors Delaying/Impairing Wound Healing Diabetes Mellitus;Vascular compromise;Altered sensation    Wound Therapy - Frequency 2X / week    Dressing  cleanse with wet gauze, lotion, santyl to wounds, 2x2, thicker sockinette followed by  profore lite, #5 netting                       PT Short Term Goals - 12/06/21 1527       PT SHORT TERM GOAL #1   Title Patient will have at least 50% granualtion tissue.    Status Achieved               PT Long Term Goals - 12/06/21 1527       PT LONG TERM GOAL #1   Title Patient's wound will be healed to reduce the risk of infection.    Status On-going      PT LONG TERM GOAL #2   Title Patient will be able to verbalize completion of daily skin checks to reduce the risk of further wound development.    Status On-going                    Patient will benefit from skilled therapeutic intervention in order to improve the following deficits and impairments:     Visit Diagnosis: Other abnormalities of gait and mobility  Ulcer of right pretibial region, limited to breakdown of skin [facility])     Problem List Patient Active Problem List   Diagnosis Date Noted   Advanced nonexudative age-related macular degeneration of right eye without subfoveal involvement 11/15/2020   Advanced nonexudative age-related macular degeneration of left eye without subfoveal involvement 11/15/2020   Diabetes mellitus without complication ([place]) 62/12/5595   Posterior vitreous detachment of both eyes 11/15/2020   Gait disturbance, post-stroke  11/02/2017   Aphasia as late effect of cerebrovascular accident 11/02/2017   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   Aphasia    PAF (paroxysmal atrial fibrillation) ([place])    Diabetes mellitus type 2 in obese (  [place])    Benign essential HTN    Hypothyroidism    Hyperlipidemia    Stroke (cerebrum) ([place]) 10/02/2017   Afib ([place]) 01/10/2017   SOB (shortness of breath) 25/83/4621   Acute diastolic CHF (congestive heart failure) ([place]) 01/10/2017   Atrial fibrillation with RVR ([place]) 01/10/2017   Claudication ([place]) 07/25/2016   Paroxysmal atrial fibrillation ([place]) 07/25/2016   Odynophagia 04/10/2014   Allergic rhinitis 03/23/2014   Chronic rhinitis 12/22/2013   Intrinsic asthma 07/15/2013   Constipation 08/22/2011   Obesity 09/20/2009   G E R D 11/29/2007   Celiac disease 11/29/2007   Ihor Austin, LPTA/CLT; CBIS [phone]  Aldona Lento, PTA 12/10/2021, 2:53 PM  [place], [place], [address] Phone: [phone]   Fax:  [phone]  Name: [name] MRN: [id number] Date of Birth: 02-Aug-1929

## 2021-12-12 DIAGNOSIS — E1142 Type 2 diabetes mellitus with diabetic polyneuropathy: Secondary | ICD-10-CM | POA: Diagnosis not present

## 2021-12-12 DIAGNOSIS — B351 Tinea unguium: Secondary | ICD-10-CM | POA: Diagnosis not present

## 2021-12-13 ENCOUNTER — Ambulatory Visit (HOSPITAL_COMMUNITY): Payer: PPO | Attending: Internal Medicine | Admitting: Physical Therapy

## 2021-12-13 ENCOUNTER — Other Ambulatory Visit: Payer: Self-pay

## 2021-12-13 DIAGNOSIS — R2689 Other abnormalities of gait and mobility: Secondary | ICD-10-CM | POA: Diagnosis not present

## 2021-12-13 DIAGNOSIS — L97811 Non-pressure chronic ulcer of other part of right lower leg limited to breakdown of skin: Secondary | ICD-10-CM | POA: Insufficient documentation

## 2021-12-13 NOTE — Therapy (Signed)
[place] [facility] ?[address] ?[place], [place], [address] ?Phone: [phone]   Fax:  [phone] ? ?Wound Care Therapy ? ?Patient Details  ?Name: [name] ?MRN: [id number] ?Date of Birth: 29-Nov-1928 ?Referring Provider (PT): [name] ? ? ?Encounter Date: 12/13/2021 ? ? PT End of Session - 12/13/21 1348   ? ? Visit Number 115   ? Number of Visits 122   ? Date for PT Re-Evaluation 01/08/22   ? Authorization Type [employer] (no auth req, no visit limit)   ? Progress Note Due on Visit 116   ? PT Start Time 1300   ? PT Stop Time 1340   ? PT Time Calculation (min) 40 min   ? Activity Tolerance Patient tolerated treatment well   ? Behavior During Therapy [facility] for tasks assessed/performed   ? ?  ?  ? ?  ? ? ?Past Medical History:  ?Diagnosis Date  ? Allergic rhinitis   ? Aortic stenosis   ? Asthmatic bronchitis   ? Celiac disease   ? Cellulitis   ? CKD (chronic kidney disease)   ? Diverticulosis   ? Fx. left wrist 1987  ? Gastric polyps   ? GERD (gastroesophageal reflux disease)   ? History of pneumonia   ? Hypertension   ? Hypothyroidism   ? Iron deficiency anemia   ? Melanoma ([place])   ? Neuropathy   ? PAF (paroxysmal atrial fibrillation) ([place])   ? Prostate cancer ([place])   ? REM sleep behavior disorder   ? RLS (restless legs syndrome)   ? Sleep apnea   ? Spasticity   ? Stroke [facility]) 09/2017  ? Tremor   ? Type 2 diabetes mellitus ([place])   ? ? ?Past Surgical History:  ?Procedure Laterality Date  ? APPENDECTOMY  1978  ? CARPAL TUNNEL RELEASE Left   ? CATARACT EXTRACTION  03/2011  ? bilateral   ? CHOLECYSTECTOMY  2001  ? COLONOSCOPY W/ BIOPSIES  04/27/2008  ? diverticulosis, duodenitis  ? FOOT SURGERY    ? HERNIA REPAIR  1994  ? bilateral  ? KNEE SURGERY Bilateral   ? x 2  ? LUNG SURGERY  2001  ? for infection  ? PROSTATECTOMY  2002  ? UPPER GASTROINTESTINAL ENDOSCOPY  04/27/2008  ? celiac disease, gastric polyps  ? ? ?There were no vitals filed for this  visit. ? ? ? ? ? ? ? ? ? ? ? ? ? ? Wound Therapy - 12/13/21 1342   ? ? Subjective no pain or issues.  States the top of the bandage slid down a little   ? Patient and Family Stated Goals wound to heal   ? Date of Onset 09/06/20   ? Prior Treatments MD and self care   ? Pain Scale 0-10   ? Pain Score 0-No pain   ? Evaluation and Treatment Procedures Explained to Patient/Family Yes   ? Evaluation and Treatment Procedures agreed to   ? Wound Properties Date First Assessed: 04/26/21 Time First Assessed: 5329 Wound Type: Non-pressure wound Location: Leg Location Orientation: Right Wound Description (Comments): superior to original wound Present on Admission: No  ? Wound Image Images linked: 1   ? Dressing Type Hydrogel;Santyl;Compression wrap   ? Dressing Changed Changed   ? Dressing Status Old drainage   ? Dressing Change Frequency PRN   ? Site / Wound Assessment Yellow;Red   ? % Wound base Red or Granulating 50%   ? % Wound  base Yellow/Fibrinous Exudate 50%   ? Wound Length (cm) 2.2 cm   ? Wound Width (cm) 1.8 cm   ? Wound Surface Area (cm^2) 3.96 cm^2   ? Margins Attached edges (approximated)   ? Drainage Amount Minimal   ? Drainage Description Serous   ? Treatment Cleansed;Debridement (Selective)   ? Wound Properties Date First Assessed: 12/04/20 Time First Assessed: 5366 Wound Type: Venous stasis ulcer Location: Leg Location Orientation: Right Present on Admission: Yes  ? Wound Image Images linked: 1   ? Dressing Type Hydrogel;Santyl;Compression wrap   ? Dressing Changed Changed   ? Dressing Status Old drainage   ? Dressing Change Frequency PRN   ? Site / Wound Assessment Pink;Red   ? % Wound base Red or Granulating 70%   ? % Wound base Yellow/Fibrinous Exudate 30%   ? Wound Length (cm) 4.3 cm   ? Wound Width (cm) 2 cm   ? Wound Surface Area (cm^2) 8.6 cm^2   ? Margins Attached edges (approximated)   ? Drainage Amount Minimal   ? Drainage Description Serous   ? Treatment Cleansed;Debridement (Selective)   ?  Selective Debridement - Location B wound beds   ? Selective Debridement - Tools Used Forceps;Scalpel   ? Selective Debridement - Tissue Removed slough   ? Wound Therapy - Clinical Statement wound measured and photographed this session.  superior wound approximated more than most distal wound in comparison to last week.  Redness remains also perimeter of wounds as can be seen in photograph.  Moisturized well following cleansing with wet gauze and continued with profore lite for compression.  Santyl with hydrogel in wound beds.   ? Wound Therapy - Functional Problem List gait and balance   ? Factors Delaying/Impairing Wound Healing Diabetes Mellitus;Vascular compromise;Altered sensation   ? Wound Therapy - Frequency 2X / week   ? Wound Plan continue woundcare and measure weekly.   ? Dressing  cleanse with wet gauze, lotion, santyl to wounds, 2x2, thicker sockinette followed by  profore lite, #5 netting   ? ?  ?  ? ?  ? ? ? ? ? ? ? ? ? ? ? ? PT Short Term Goals - 12/06/21 1527   ? ?  ? PT SHORT TERM GOAL #1  ? Title Patient will have at least 50% granualtion tissue.   ? Status Achieved   ? ?  ?  ? ?  ? ? ? ? PT Long Term Goals - 12/06/21 1527   ? ?  ? PT LONG TERM GOAL #1  ? Title Patient's wound will be healed to reduce the risk of infection.   ? Status On-going   ?  ? PT LONG TERM GOAL #2  ? Title Patient will be able to verbalize completion of daily skin checks to reduce the risk of further wound development.   ? Status On-going   ? ?  ?  ? ?  ? ? ? ? ? ? ? ? ? ?Patient will benefit from skilled therapeutic intervention in order to improve the following deficits and impairments:    ? ?Visit Diagnosis: ?Other abnormalities of gait and mobility ? ?Ulcer of right pretibial region, limited to breakdown of skin ([place]) ? ? ? ? ?Problem List ?Patient Active Problem List  ? Diagnosis Date Noted  ? Advanced nonexudative age-related macular degeneration of right eye without subfoveal involvement 11/15/2020  ? Advanced  nonexudative age-related macular degeneration of left eye without subfoveal involvement 11/15/2020  ? Diabetes  mellitus without complication ([place]) 97/67/3419  ? Posterior vitreous detachment of both eyes 11/15/2020  ? Gait disturbance, post-stroke 11/02/2017  ? Aphasia as late effect of cerebrovascular accident 11/02/2017  ? 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 Aphasia   ? PAF (paroxysmal atrial fibrillation) ([place])   ? Diabetes mellitus type 2 in obese [facility])   ? Benign essential HTN   ? Hypothyroidism   ? Hyperlipidemia   ? Stroke (cerebrum) ([place]) 10/02/2017  ? Afib ([place]) 01/10/2017  ? SOB (shortness of breath) 01/10/2017  ? Acute diastolic CHF (congestive heart failure) ([place]) 01/10/2017  ? Atrial fibrillation with RVR ([place]) 01/10/2017  ? Claudication ([place]) 07/25/2016  ? Paroxysmal atrial fibrillation ([place]) 07/25/2016  ? Odynophagia 04/10/2014  ? Allergic rhinitis 03/23/2014  ? Chronic rhinitis 12/22/2013  ? Intrinsic asthma 07/15/2013  ? Constipation 08/22/2011  ? Obesity 09/20/2009  ? G E R D 11/29/2007  ? Celiac disease 11/29/2007  ? ?Kassidi Elza Sula Soda, PTA/CLT, WTA ?[phone] ? ?Roseanne Reno B, PTA ?12/13/2021, 1:49 PM ? ?[place] [facility] ?[address] ?[place], [place], [address] ?Phone: [phone]   Fax:  [phone] ? ?Name: [name] ?MRN: [id number] ?Date of Birth: 16-Sep-1929 ? ? ? ? ?

## 2021-12-16 NOTE — Progress Notes (Signed)
Cardiology Office Note  Date: 12/17/2021   ID: Adarian, Bur 10-22-1928, MRN [id number]  PCP:  Celene Squibb, MD  Cardiologist:  Rozann Lesches, MD Electrophysiologist:  None   Chief Complaint  Patient presents with   Cardiac follow-up    History of Present Illness: [name] is a 88 y.o. male last seen in August 2022.  He is here with his wife for a follow-up visit.  Current level of activity, he does not describe any obvious angina symptoms, has NYHA class II dyspnea and syncope.  Echocardiogram from February revealed LVEF 60 to 65% with mild diastolic dysfunction, and moderate to severe aortic stenosis with mean gradient 40 mmHg and dimensionless index 0.33.  This is at the upper end of the range.  I have talked with them about possibility of TAVR evaluation, but this may be a situation ultimately that is best managed conservatively based on his functional capacity and other risks including iron deficiency anemia (GI work-up pending), and renal insufficiency with most recent creatinine 2.1.  I reviewed his medications and also went over his home blood pressure and heart rate checks.  Generally systolics are in the 778E to 140s range.  I personally reviewed his ECG today which shows sinus bradycardia with prolonged PR interval and right bundle branch block.  Past Medical History:  Diagnosis Date   Allergic rhinitis    Aortic stenosis    Asthmatic bronchitis    Celiac disease    Cellulitis    CKD (chronic kidney disease)    Diverticulosis    Fx. left wrist 1987   Gastric polyps    GERD (gastroesophageal reflux disease)    History of pneumonia    Hypertension    Hypothyroidism    Iron deficiency anemia    Melanoma (H[facility])    Neuropathy    PAF (paroxysmal atrial fibrillation) ([facility])    Prostate cancer ([place])    REM sleep behavior disorder    RLS (restless legs syndrome)    Sleep apnea    Spasticity    Stroke ([place]) 09/2017   Tremor    Type 2 diabetes mellitus  ([place])     Past Surgical History:  Procedure Laterality Date   APPENDECTOMY  1978   CARPAL TUNNEL RELEASE Left    CATARACT EXTRACTION  03/2011   bilateral    CHOLECYSTECTOMY  2001   COLONOSCOPY W/ BIOPSIES  04/27/2008   diverticulosis, duodenitis   FOOT SURGERY     HERNIA REPAIR  1994   bilateral   KNEE SURGERY Bilateral    x 2   LUNG SURGERY  2001   for infection   PROSTATECTOMY  2002   UPPER GASTROINTESTINAL ENDOSCOPY  04/27/2008   celiac disease, gastric polyps    Current Outpatient Medications  Medication Sig Dispense Refill   acetaminophen (TYLENOL) 325 MG tablet Take 650 mg by mouth every 6 (six) hours as needed. Takes 2 once daily     amitriptyline (ELAVIL) 50 MG tablet Take 50 mg by mouth at bedtime.     apixaban (ELIQUIS) 2.5 MG TABS tablet Take 1 tablet (2.5 mg total) by mouth 2 (two) times daily. 60 tablet 6   Ascorbic Acid (VITAMIN C) 1000 MG tablet Take 1,000 mg by mouth 2 (two) times daily.     atorvastatin (LIPITOR) 20 MG tablet Take 20 mg by mouth daily.     azelastine (ASTELIN) 0.1 % nasal spray Place 1 spray into both nostrils at bedtime.  calcitRIOL (ROCALTROL) 0.25 MCG capsule Take 0.25 mcg by mouth daily.     Cholecalciferol (VITAMIN D3) 1000 UNITS CAPS Take 1,000 Units by mouth daily.     diclofenac sodium (VOLTAREN) 1 % GEL Apply 2 g topically 4 (four) times daily. 1 Tube 0   empagliflozin (JARDIANCE) 10 MG TABS tablet Take 10 mg by mouth daily.     Fexofenadine HCl (MUCINEX ALLERGY PO) Take 1 tablet by mouth daily.     fluticasone (FLONASE) 50 MCG/ACT nasal spray Place 1 spray into both nostrils daily.      furosemide (LASIX) 20 MG tablet Take 20 mg by mouth. '80mg'$  in the morning and 60 mg in the afternoon     Ginger, Zingiber officinalis, (GINGER ROOT) 550 MG CAPS Take by mouth. 2 daily     glipiZIDE (GLUCOTROL XL) 10 MG 24 hr tablet 10 mg.     JANUVIA 100 MG tablet 100 mg daily. morning     levocetirizine (XYZAL) 5 MG tablet Take 5 mg by mouth at  bedtime.      levothyroxine (SYNTHROID, LEVOTHROID) 150 MCG tablet Take 1 tablet (150 mcg total) by mouth daily before breakfast. 30 tablet 0   MAGNESIUM MALATE PO Take 500 mg by mouth daily.     Melatonin 3 MG TABS Take 3 mg by mouth at bedtime.     metoprolol tartrate (LOPRESSOR) 25 MG tablet TAKE 2 TABLETS BY MOUTH TWICE DAILY. 240 tablet 3   Misc Natural Products (TART CHERRY ADVANCED PO) Take 425 mg by mouth. 2 daily     Omega-3 Fatty Acids (FISH OIL) 1000 MG CAPS Take by mouth daily.     pantoprazole (PROTONIX) 40 MG tablet Take 1 tablet (40 mg total) by mouth daily. 30 tablet 0   polyethylene glycol (MIRALAX / GLYCOLAX) 17 g packet Take 17 g by mouth as needed.     tiZANidine (ZANAFLEX) 2 MG tablet      doxycycline (VIBRA-TABS) 100 MG tablet Take 100 mg by mouth daily.     No current facility-administered medications for this visit.   Allergies:  Clindamycin, Gluten meal, Hydrocodone, Penicillins, Procaine, Sulfonamide derivatives, Tape, Wheat bran, Cephalexin, and Mold extract [trichophyton]   ROS: No orthopnea or PND.  Physical Exam: VS:  BP (!) 126/52    Pulse (!) 57    Ht '5\' 10"'$  (1.778 m)    Wt 196 lb 12.8 oz (89.3 kg)    SpO2 91%    BMI 28.24 kg/m , BMI Body mass index is 28.24 kg/m.  Wt Readings from Last 3 Encounters:  12/17/21 196 lb 12.8 oz (89.3 kg)  09/26/21 195 lb 1.7 oz (88.5 kg)  09/19/21 195 lb 1.7 oz (88.5 kg)    General: Patient appears comfortable at rest. HEENT: Conjunctiva and lids normal, wearing a mask. Neck: Supple, no elevated JVP or carotid bruits, no thyromegaly. Lungs: Clear to auscultation, nonlabored breathing at rest. Cardiac: Regular rate and rhythm, no S3, 3/6 systolic murmur. Extremities: Mild lower leg edema.  ECG:  An ECG dated 08/30/2020 was personally reviewed today and demonstrated:  Sinus rhythm with prolonged PR interval, right bundle branch block and leftward axis.  Recent Labwork:  February 2023: Hemoglobin A1c 6.9%, cholesterol  111, triglycerides 80, HDL 48, LDL 47, BUN 77, creatinine 2.05, potassium 4.5, AST 17, ALT 12, hemoglobin 9.9, platelets 339, TSH 0.599  Other Studies Reviewed Today:  Echocardiogram 12/05/2021:  1. Left ventricular ejection fraction, by estimation, is 60 to 65%. The  left ventricle  has normal function. The left ventricle has no regional  wall motion abnormalities. Left ventricular diastolic parameters are  consistent with Grade I diastolic  dysfunction (impaired relaxation).   2. Right ventricular systolic function is normal. The right ventricular  size is normal. Tricuspid regurgitation signal is inadequate for assessing  PA pressure.   3. The mitral valve is normal in structure. No evidence of mitral valve  regurgitation. No evidence of mitral stenosis.   4. The aortic valve has an indeterminant number of cusps. There is  moderate calcification of the aortic valve. There is moderate thickening  of the aortic valve. Aortic valve regurgitation is moderate. Moderate to  severe aortic valve stenosis. . Aortic  valve mean gradient measures 40.0 mmHg. Aortic valve peak gradient  measures 57.5 mmHg. Aortic valve area, by VTI measures 1.03 cm.   5. The inferior vena cava is normal in size with greater than 50%  respiratory variability, suggesting right atrial pressure of 3 mmHg.   Assessment and Plan:  1.  Moderate to severe calcific aortic stenosis, upper end of scale with recent mean gradient 40 mmHg and dimensionless index 0.33.  At present level of activity he does not describe any worsening symptoms.  We have discussed the possibility of a TAVR evaluation, but conservative management may make more sense in light of risks including ongoing renal insufficiency and iron deficiency anemia with GI work-up pending.  We will continue to follow expectantly, repeat echocardiogram in 6 months.  He is most comfortable with observation at this point per discussion today.  2.  Paroxysmal atrial  fibrillation with CHA2DS2-VASc score of 5.  He is on low-dose Eliquis for stroke prophylaxis and is in sinus rhythm today on low-dose Lopressor.  3.  CKD stage IV, following with Dr. Theador Hawthorne.  Most recent creatinine 2.1.  Medication Adjustments/Labs and Tests Ordered: Current medicines are reviewed at length with the patient today.  Concerns regarding medicines are outlined above.   Tests Ordered: Orders Placed This Encounter  Procedures   EKG 12-Lead   ECHOCARDIOGRAM COMPLETE    Medication Changes: No orders of the defined types were placed in this encounter.   Disposition:  Follow up  6 months.  Signed, Satira Sark, MD, [facility] 12/17/2021 12:08 PM    [facility] at [facility] [address], [place], [place] [address] Phone: [phone]; Fax: [phone]

## 2021-12-17 ENCOUNTER — Other Ambulatory Visit: Payer: Self-pay

## 2021-12-17 ENCOUNTER — Ambulatory Visit: Payer: PPO | Admitting: Cardiology

## 2021-12-17 ENCOUNTER — Encounter: Payer: Self-pay | Admitting: Cardiology

## 2021-12-17 ENCOUNTER — Ambulatory Visit (HOSPITAL_COMMUNITY): Payer: PPO

## 2021-12-17 ENCOUNTER — Encounter (HOSPITAL_COMMUNITY): Payer: Self-pay

## 2021-12-17 VITALS — BP 126/52 | HR 57 | Ht 70.0 in | Wt 196.8 lb

## 2021-12-17 DIAGNOSIS — I350 Nonrheumatic aortic (valve) stenosis: Secondary | ICD-10-CM | POA: Diagnosis not present

## 2021-12-17 DIAGNOSIS — N184 Chronic kidney disease, stage 4 (severe): Secondary | ICD-10-CM | POA: Diagnosis not present

## 2021-12-17 DIAGNOSIS — R2689 Other abnormalities of gait and mobility: Secondary | ICD-10-CM | POA: Diagnosis not present

## 2021-12-17 DIAGNOSIS — L97811 Non-pressure chronic ulcer of other part of right lower leg limited to breakdown of skin: Secondary | ICD-10-CM

## 2021-12-17 DIAGNOSIS — I480 Paroxysmal atrial fibrillation: Secondary | ICD-10-CM

## 2021-12-17 NOTE — Patient Instructions (Signed)
Medication Instructions:  ?Your physician recommends that you continue on your current medications as directed. Please refer to the Current Medication list given to you today. ? ? ?Labwork: ?None today ? ?Testing/Procedures: ?Your physician has requested that you have an echocardiogram IN 6 MONTHS. Echocardiography is a painless test that uses sound waves to create images of your heart. It provides your doctor with information about the size and shape of your heart and how well your heart?s chambers and valves are working. This procedure takes approximately one hour. There are no restrictions for this procedure. ? ? ?Follow-Up: ?6 months ? ?Any Other Special Instructions Will Be Listed Below (If Applicable). ? ?If you need a refill on your cardiac medications before your next appointment, please call your pharmacy. ? ?

## 2021-12-17 NOTE — Therapy (Addendum)
[facility] ?[address] ?[place], [place], [address] ?Phone: [phone]   Fax:  [phone] ? ?Wound Care Therapy ?Progress Note ?Reporting Period 11/12/20 to 12/17/21 ? ?See note below for Objective Data and Assessment of Progress/Goals.  ? ? ? ?Patient Details  ?Name: [name] ?MRN: [id number] ?Date of Birth: 1929/10/07 ?Referring Provider (PT): [name] ? ? ?Encounter Date: 12/17/2021 ? ? PT End of Session - 12/17/21 1534   ? ? Visit Number 116   ? Number of Visits 122   ? Date for PT Re-Evaluation 01/08/22   ? Authorization Type [employer] (no auth req, no visit limit)   ? Progress Note Due on Visit 116   ? PT Start Time 0947   ? PT Stop Time 1530   ? PT Time Calculation (min) 43 min   ? Activity Tolerance Patient tolerated treatment well   ? Behavior During Therapy [facility] for tasks assessed/performed   ? ?  ?  ? ?  ? ? ?Past Medical History:  ?Diagnosis Date  ? Allergic rhinitis   ? Aortic stenosis   ? Asthmatic bronchitis   ? Celiac disease   ? Cellulitis   ? CKD (chronic kidney disease)   ? Diverticulosis   ? Fx. left wrist 1987  ? Gastric polyps   ? GERD (gastroesophageal reflux disease)   ? History of pneumonia   ? Hypertension   ? Hypothyroidism   ? Iron deficiency anemia   ? Melanoma ([place])   ? Neuropathy   ? PAF (paroxysmal atrial fibrillation) ([place])   ? Prostate cancer ([place])   ? REM sleep behavior disorder   ? RLS (restless legs syndrome)   ? Sleep apnea   ? Spasticity   ? Stroke [facility] 09/2017  ? Tremor   ? Type 2 diabetes mellitus ([place])   ? ? ?Past Surgical History:  ?Procedure Laterality Date  ? APPENDECTOMY  1978  ? CARPAL TUNNEL RELEASE Left   ? CATARACT EXTRACTION  03/2011  ? bilateral   ? CHOLECYSTECTOMY  2001  ? COLONOSCOPY W/ BIOPSIES  04/27/2008  ? diverticulosis, duodenitis  ? FOOT SURGERY    ? HERNIA REPAIR  1994  ? bilateral  ? KNEE SURGERY Bilateral   ? x 2  ? LUNG SURGERY  2001  ? for infection  ? PROSTATECTOMY  2002  ? UPPER  GASTROINTESTINAL ENDOSCOPY  04/27/2008  ? celiac disease, gastric polyps  ? ? ?There were no vitals filed for this visit. ? ? ? Subjective Assessment - 12/17/21 1545   ? ? Subjective No reports of pain, dressings intact.   ? ?  ?  ? ?  ? ? ? ? ? ? ? ? ? ? ? ? Wound Therapy - 12/17/21 0001   ? ? Subjective No reports of pain, dressings intact.   ? Patient and Family Stated Goals wound to heal   ? Date of Onset 09/06/20   ? Prior Treatments MD and self care   ? Pain Scale 0-10   ? Pain Score 0-No pain   ? Evaluation and Treatment Procedures Explained to Patient/Family Yes   ? Evaluation and Treatment Procedures agreed to   ? Wound Properties Date First Assessed: 04/26/21 Time First Assessed: 0962 Wound Type: Non-pressure wound Location: Leg Location Orientation: Right Wound Description (Comments): superior to original wound Present on Admission: No  ? Wound Image Images linked: 1   ? Dressing Type Hydrogel;Santyl;Compression wrap   ? Dressing  Changed Changed   ? Dressing Status Old drainage   ? Dressing Change Frequency PRN   ? Site / Wound Assessment Yellow;Red   ? % Wound base Red or Granulating 50%   ? % Wound base Yellow/Fibrinous Exudate 50%   ? Wound Length (cm) 2.2 cm   measured on 12/13/21  ? Wound Width (cm) 1.8 cm   ? Wound Surface Area (cm^2) 3.96 cm^2   ? Margins Attached edges (approximated)   ? Drainage Amount Minimal   ? Drainage Description Serous   ? Treatment Cleansed;Debridement (Selective)   ? Wound Properties Date First Assessed: 12/04/20 Time First Assessed: 0277 Wound Type: Venous stasis ulcer Location: Leg Location Orientation: Right Present on Admission: Yes  ? Wound Image Images linked: 1   ? Dressing Type Hydrogel;Santyl;Compression wrap   ? Dressing Changed Changed   ? Dressing Status Old drainage   ? Dressing Change Frequency PRN   ? Site / Wound Assessment Pink;Red   ? % Wound base Red or Granulating 70%   ? % Wound base Yellow/Fibrinous Exudate 30%   ? Wound Length (cm) 4.3 cm   measured on  12/13/21  ? Wound Width (cm) 2 cm   measured on 12/13/21  ? Wound Surface Area (cm^2) 8.6 cm^2   ? Margins Attached edges (approximated)   ? Drainage Amount Minimal   ? Drainage Description Serous   ? Treatment Cleansed;Debridement (Selective)   ? Selective Debridement - Location B wound beds   ? Selective Debridement - Tools Used Forceps;Scalpel   ? Selective Debridement - Tissue Removed slough   ? Wound Therapy - Clinical Statement Improved granulation with both wound following selective debridement for removal of adherent slough from wound bed.  Continued with hydrogel, santyl and profore lite with soft liner.  Pt brought in another bottle of Santyl this session.   ? Wound Therapy - Functional Problem List gait and balance   ? Factors Delaying/Impairing Wound Healing Diabetes Mellitus;Vascular compromise;Altered sensation   ? Wound Therapy - Frequency 2X / week   ? Wound Plan continue woundcare and measure weekly.   ? Dressing  cleanse with wet gauze, lotion, santyl to wounds, 2x2, thicker sockinette followed by  profore lite, #5 netting   ? ?  ?  ? ?  ? ? ? ? ? ? ? ? ? ? ? ? PT Short Term Goals - 12/06/21 1527   ? ?  ? PT SHORT TERM GOAL #1  ? Title Patient will have at least 50% granualtion tissue.   ? Status Achieved   ? ?  ?  ? ?  ? ? ? ? PT Long Term Goals - 12/17/21 1710   ? ?  ? PT LONG TERM GOAL #1  ? Title Patient's wound will be healed to reduce the risk of infection.   ? Status On-going   ?  ? PT LONG TERM GOAL #2  ? Title Patient will be able to verbalize completion of daily skin checks to reduce the risk of further wound development.   ? Status On-going   ? ?  ?  ? ?  ? ? ? ? ? ? ? ? ? ?Patient will benefit from skilled therapeutic intervention in order to improve the following deficits and impairments:    ? ?Visit Diagnosis: ?Other abnormalities of gait and mobility ? ?Ulcer of right pretibial region, limited to breakdown of skin ([place]) ? ? ? ? ?Problem List ?Patient Active Problem List  ? Diagnosis Date  Noted  ? Advanced nonexudative age-related macular degeneration of right eye without subfoveal involvement 11/15/2020  ? Advanced nonexudative age-related macular degeneration of left eye without subfoveal involvement 11/15/2020  ? Diabetes mellitus without complication ([place]) 36/64/4034  ? Posterior vitreous detachment of both eyes 11/15/2020  ? Gait disturbance, post-stroke 11/02/2017  ? Aphasia as late effect of cerebrovascular accident 11/02/2017  ? 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 Aphasia   ? PAF (paroxysmal atrial fibrillation) ([place])   ? Diabetes mellitus type 2 in obese [facility])   ? Benign essential HTN   ? Hypothyroidism   ? Hyperlipidemia   ? Stroke (cerebrum) ([place]) 10/02/2017  ? Afib ([place]) 01/10/2017  ? SOB (shortness of breath) 01/10/2017  ? Acute diastolic CHF (congestive heart failure) ([place]) 01/10/2017  ? Atrial fibrillation with RVR ([place]) 01/10/2017  ? Claudication ([place]) 07/25/2016  ? Paroxysmal atrial fibrillation ([place]) 07/25/2016  ? Odynophagia 04/10/2014  ? Allergic rhinitis 03/23/2014  ? Chronic rhinitis 12/22/2013  ? Intrinsic asthma 07/15/2013  ? Constipation 08/22/2011  ? Obesity 09/20/2009  ? G E R D 11/29/2007  ? Celiac disease 11/29/2007  ? ?Ihor Austin, LPTA/CLT; CBIS ?[phone] ?Rayetta Humphrey, PT CLT ?[phone]  ?12/17/2021, 5:11 PM ? ?[place] ?[place] ?[address] ?[place], [place], [address] ?Phone: [phone]   Fax:  [phone] ? ?Name: [name] ?MRN: [id number] ?Date of Birth: [birth year] ? ? ? ? ?

## 2021-12-18 DIAGNOSIS — Z79890 Hormone replacement therapy (postmenopausal): Secondary | ICD-10-CM | POA: Diagnosis not present

## 2021-12-18 DIAGNOSIS — Z7984 Long term (current) use of oral hypoglycemic drugs: Secondary | ICD-10-CM | POA: Diagnosis not present

## 2021-12-18 DIAGNOSIS — K219 Gastro-esophageal reflux disease without esophagitis: Secondary | ICD-10-CM | POA: Diagnosis not present

## 2021-12-18 DIAGNOSIS — M1712 Unilateral primary osteoarthritis, left knee: Secondary | ICD-10-CM | POA: Diagnosis not present

## 2021-12-18 DIAGNOSIS — E039 Hypothyroidism, unspecified: Secondary | ICD-10-CM | POA: Diagnosis not present

## 2021-12-18 DIAGNOSIS — E119 Type 2 diabetes mellitus without complications: Secondary | ICD-10-CM | POA: Diagnosis not present

## 2021-12-18 DIAGNOSIS — H353190 Nonexudative age-related macular degeneration, unspecified eye, stage unspecified: Secondary | ICD-10-CM | POA: Diagnosis not present

## 2021-12-20 ENCOUNTER — Encounter (HOSPITAL_COMMUNITY): Payer: Self-pay | Admitting: Physical Therapy

## 2021-12-20 ENCOUNTER — Other Ambulatory Visit: Payer: Self-pay

## 2021-12-20 ENCOUNTER — Ambulatory Visit (HOSPITAL_COMMUNITY): Payer: PPO | Admitting: Physical Therapy

## 2021-12-20 DIAGNOSIS — R2689 Other abnormalities of gait and mobility: Secondary | ICD-10-CM | POA: Diagnosis not present

## 2021-12-20 DIAGNOSIS — L97811 Non-pressure chronic ulcer of other part of right lower leg limited to breakdown of skin: Secondary | ICD-10-CM

## 2021-12-20 NOTE — Therapy (Signed)
?OUTPATIENT PHYSICAL THERAPY TREATMENT NOTE ? ? ?Patient Name: [name] ?MRN: [id number] ?DOB:[birth year], 88 y.o., male ?Today's Date: 12/20/2021 ? ?PCP: Celene Squibb, MD ?REFERRING PROVIDER: Celene Squibb, MD ? ? PT End of Session - 12/20/21 1556   ? ? Visit Number 117   ? Number of Visits 122   ? Date for PT Re-Evaluation 01/08/22   ? Authorization Type [employer] (no auth req, no visit limit)   ? Progress Note Due on Visit 116   ? PT Start Time 1448   ? PT Stop Time 1526   ? PT Time Calculation (min) 38 min   ? Activity Tolerance Patient tolerated treatment well   ? Behavior During Therapy [facility] for tasks assessed/performed   ? ?  ?  ? ?  ? ? ?Past Medical History:  ?Diagnosis Date  ? Allergic rhinitis   ? Aortic stenosis   ? Asthmatic bronchitis   ? Celiac disease   ? Cellulitis   ? CKD (chronic kidney disease)   ? Diverticulosis   ? Fx. left wrist 1987  ? Gastric polyps   ? GERD (gastroesophageal reflux disease)   ? History of pneumonia   ? Hypertension   ? Hypothyroidism   ? Iron deficiency anemia   ? Melanoma ([place])   ? Neuropathy   ? PAF (paroxysmal atrial fibrillation) ([place])   ? Prostate cancer ([place])   ? REM sleep behavior disorder   ? RLS (restless legs syndrome)   ? Sleep apnea   ? Spasticity   ? Stroke [facility]) 09/2017  ? Tremor   ? Type 2 diabetes mellitus ([place])   ? ?Past Surgical History:  ?Procedure Laterality Date  ? APPENDECTOMY  1978  ? CARPAL TUNNEL RELEASE Left   ? CATARACT EXTRACTION  03/2011  ? bilateral   ? CHOLECYSTECTOMY  2001  ? COLONOSCOPY W/ BIOPSIES  04/27/2008  ? diverticulosis, duodenitis  ? FOOT SURGERY    ? HERNIA REPAIR  1994  ? bilateral  ? KNEE SURGERY Bilateral   ? x 2  ? LUNG SURGERY  2001  ? for infection  ? PROSTATECTOMY  2002  ? UPPER GASTROINTESTINAL ENDOSCOPY  04/27/2008  ? celiac disease, gastric polyps  ? ?Patient Active Problem List  ? Diagnosis Date Noted  ? Advanced nonexudative age-related macular degeneration of right eye without subfoveal involvement 11/15/2020   ? Advanced nonexudative age-related macular degeneration of left eye without subfoveal involvement 11/15/2020  ? Diabetes mellitus without complication ([place]) 97/11/6376  ? Posterior vitreous detachment of both eyes 11/15/2020  ? Gait disturbance, post-stroke 11/02/2017  ? Aphasia as late effect of cerebrovascular accident 11/02/2017  ? 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 Aphasia   ? PAF (paroxysmal atrial fibrillation) ([place])   ? Diabetes mellitus type 2 in obese [facility])   ? Benign essential HTN   ? Hypothyroidism   ? Hyperlipidemia   ? Stroke (cerebrum) ([place]) 10/02/2017  ? Afib ([place]) 01/10/2017  ? SOB (shortness of breath) 01/10/2017  ? Acute diastolic CHF (congestive heart failure) ([place]) 01/10/2017  ? Atrial fibrillation with RVR ([place]) 01/10/2017  ? Claudication ([place]) 07/25/2016  ? Paroxysmal atrial fibrillation ([place]) 07/25/2016  ? Odynophagia 04/10/2014  ? Allergic rhinitis 03/23/2014  ? Chronic rhinitis 12/22/2013  ? Intrinsic asthma 07/15/2013  ? Constipation 08/22/2011  ? Obesity 09/20/2009  ? G E R D 11/29/2007  ? Celiac disease 11/29/2007  ? ? ?REFERRING DIAG: Nonhealing wound. ? ?THERAPY DIAG:  ?Other abnormalities  of gait and mobility ? ?Ulcer of right pretibial region, limited to breakdown of skin ([place]) ? ?PERTINENT HISTORY: aortic stenosis, CKD stage IV  ? ?PRECAUTIONS: cellulitis ? ?SUBJECTIVE:PT has no pain except at night.  ? ? 12/20/21 0001  ?Subjective Assessment  ?Subjective No reports of pain, dressings intact.  ?Patient and Family Stated Goals wound to heal  ?Date of Onset 09/06/20  ?Prior Treatments MD and self care  ?Pain Assessment  ?Pain Scale 0-10  ?Pain Score 0  ?Evaluation and Treatment  ?Evaluation and Treatment Procedures Explained to Patient/Family Yes  ?Evaluation and Treatment Procedures agreed to  ?Wound / Incision (Open or Dehisced) 04/26/21 Non-pressure wound Leg Right superior to original wound  ?Date First Assessed/Time First Assessed: 04/26/21 1549   Wound Type:  Non-pressure wound  Location: Leg  Location Orientation: Right  Wound Description (Comments): superior to original wound  Present on Admission: No  ?Dressing Type Hydrogel;Santyl;Compression wrap  ?Dressing Changed Changed  ?Dressing Status Old drainage  ?Dressing Change Frequency PRN  ?Site / Wound Assessment Yellow;Red  ?% Wound base Red or Granulating 50%  ?% Wound base Yellow/Fibrinous Exudate 50%  ?Wound Length (cm) 2.2 cm  ?Wound Width (cm) 1.9 cm  ?Wound Surface Area (cm^2) 4.18 cm^2  ?Margins Attached edges (approximated)  ?Drainage Amount Minimal  ?Drainage Description Serous  ?Treatment Cleansed;Debridement (Selective)  ?Wound / Incision (Open or Dehisced) 12/04/20 Venous stasis ulcer Leg Right  ?Date First Assessed/Time First Assessed: 12/04/20 1545   Wound Type: Venous stasis ulcer  Location: Leg  Location Orientation: Right  Present on Admission: Yes  ?Dressing Type Hydrogel;Santyl;Compression wrap  ?Dressing Changed Changed  ?Dressing Status Old drainage  ?Dressing Change Frequency PRN  ?Site / Wound Assessment Pink;Red  ?% Wound base Red or Granulating 70%  ?% Wound base Yellow/Fibrinous Exudate 30%  ?Wound Length (cm) 4.3 cm  ?Wound Width (cm) 1.8 cm  ?Wound Surface Area (cm^2) 7.74 cm^2  ?Margins Attached edges (approximated)  ?Drainage Amount Minimal  ?Drainage Description Serous  ?Treatment Cleansed;Debridement (Selective)  ?Selective Debridement  ?Selective Debridement - Location B wound beds  ?Selective Debridement - Tools Used Forceps;Scalpel  ?Selective Debridement - Tissue Removed slough  ?Wound Therapy - Assess/Plan/Recommendations  ?Wound Therapy - Clinical Statement Pt able to tolerate debridement with greater ease.  LE continues to be very dry with some induration in superior aspect.  Lotion followed by manual to attempt to address these issues.  Very slow healing occuring possible attempt of laser to improve granulation.  ?Wound Therapy - Functional Problem List gait and balance   ?Factors Delaying/Impairing Wound Healing Diabetes Mellitus;Vascular compromise;Altered sensation  ?Hydrotherapy Plan  ?(trial of laser to see if this increases wound healing.)  ?Wound Therapy - Frequency 2X / week  ?Wound Plan continue woundcare and measure weekly.  ?Wound Therapy  ?Dressing  cleanse with wet gauze, lotion, santyl to wounds, 2x2, thicker sockinette followed by  profore lite, #5 netting  ? ? ? ? PT Short Term Goals - 12/20/21 1557   ? ?  ? PT SHORT TERM GOAL #1  ? Title Patient will have at least 50% granualtion tissue.   ? Status Achieved   ? ?  ?  ? ?  ? ? ? PT Long Term Goals - 12/20/21 1558   ? ?  ? PT LONG TERM GOAL #1  ? Title Patient's wound will be healed to reduce the risk of infection.   ? Status On-going   ?  ? PT LONG TERM GOAL #2  ?  Title Patient will be able to verbalize completion of daily skin checks to reduce the risk of further wound development.   ? Status On-going   ? ?  ?  ? ?  ? ? ? Plan - 12/20/21 1558   ? ? Clinical Impression Statement see above   ? Personal Factors and Comorbidities Age;Comorbidity 3+;Past/Current Experience;Fitness;Time since onset of injury/illness/exacerbation   ? Comorbidities DM, CKD, neuorpathy, Hx CVA   ? Examination-Activity Limitations Locomotion Level;Transfers;Squat;Stand;Dressing   ? Examination-Participation Restrictions Community Activity   ? Stability/Clinical Decision Making Stable/Uncomplicated   ? Rehab Potential Good   ? PT Frequency 2x / week   ? PT Duration 6 weeks   ? PT Treatment/Interventions Gait training;Stair training;Functional mobility training;DME Instruction;Therapeutic activities;Therapeutic exercise;Balance training;Neuromuscular re-education;Patient/family education;Compression bandaging;Manual lymph drainage;Manual techniques   ? PT Next Visit Plan Continue with both chemical and mechanical debridement to remove adherent eschar to allow healing to occur   ? Consulted and Agree with Plan of Care Patient;Family  member/caregiver   ? Family Member Consulted wife   ? ?  ?  ? ?  ? ? ? ?Rayetta Humphrey, PT CLT ?[phone]  ?12/20/2021, 4:08 PM ? ?   ?

## 2021-12-24 ENCOUNTER — Other Ambulatory Visit: Payer: Self-pay

## 2021-12-24 ENCOUNTER — Encounter (HOSPITAL_COMMUNITY): Payer: Self-pay

## 2021-12-24 ENCOUNTER — Ambulatory Visit (HOSPITAL_COMMUNITY): Payer: PPO

## 2021-12-24 DIAGNOSIS — R2689 Other abnormalities of gait and mobility: Secondary | ICD-10-CM | POA: Diagnosis not present

## 2021-12-24 DIAGNOSIS — L97811 Non-pressure chronic ulcer of other part of right lower leg limited to breakdown of skin: Secondary | ICD-10-CM

## 2021-12-24 NOTE — Therapy (Signed)
[place] ?[place] ?[address] ?[place], [place], [address] ?Phone: [phone]   Fax:  [phone] ? ?Wound Care Therapy ? ?Patient Details  ?Name: [name] ?MRN: [id number] ?Date of Birth: [birth year] ?Referring Provider (PT): [name] ? ? ?Encounter Date: 12/24/2021 ? ? PT End of Session - 12/24/21 1555   ? ? Visit Number 118   ? Number of Visits 122   ? Date for PT Re-Evaluation 01/08/22   ? Authorization Type [employer] (no auth req, no visit limit)   ? Progress Note Due on Visit 122   ? PT Start Time 1451   ? PT Stop Time 0350   ? PT Time Calculation (min) 41 min   ? Activity Tolerance Patient tolerated treatment well   ? Behavior During Therapy [facility] for tasks assessed/performed   ? ?  ?  ? ?  ? ? ?Past Medical History:  ?Diagnosis Date  ? Allergic rhinitis   ? Aortic stenosis   ? Asthmatic bronchitis   ? Celiac disease   ? Cellulitis   ? CKD (chronic kidney disease)   ? Diverticulosis   ? Fx. left wrist 1987  ? Gastric polyps   ? GERD (gastroesophageal reflux disease)   ? History of pneumonia   ? Hypertension   ? Hypothyroidism   ? Iron deficiency anemia   ? Melanoma ([place])   ? Neuropathy   ? PAF (paroxysmal atrial fibrillation) ([place])   ? Prostate cancer ([place])   ? REM sleep behavior disorder   ? RLS (restless legs syndrome)   ? Sleep apnea   ? Spasticity   ? Stroke [facility]) 09/2017  ? Tremor   ? Type 2 diabetes mellitus ([place])   ? ? ?Past Surgical History:  ?Procedure Laterality Date  ? APPENDECTOMY  1978  ? CARPAL TUNNEL RELEASE Left   ? CATARACT EXTRACTION  03/2011  ? bilateral   ? CHOLECYSTECTOMY  2001  ? COLONOSCOPY W/ BIOPSIES  04/27/2008  ? diverticulosis, duodenitis  ? FOOT SURGERY    ? HERNIA REPAIR  1994  ? bilateral  ? KNEE SURGERY Bilateral   ? x 2  ? LUNG SURGERY  2001  ? for infection  ? PROSTATECTOMY  2002  ? UPPER GASTROINTESTINAL ENDOSCOPY  04/27/2008  ? celiac disease, gastric polyps  ? ? ?There were no vitals filed for this visit. ? ? ? Subjective  Assessment - 12/24/21 1546   ? ? Subjective Feeling good, no reports of pain today.   ? ?  ?  ? ?  ? ? ? ? ? ? ? ? ? ? ? ? Wound Therapy - 12/24/21 0001   ? ? Subjective Feeling good, no reports of pain today.   ? Patient and Family Stated Goals wound to heal   ? Date of Onset 09/06/20   ? Prior Treatments MD and self care   ? Pain Scale 0-10   ? Pain Score 0-No pain   ? Evaluation and Treatment Procedures Explained to Patient/Family Yes   ? Evaluation and Treatment Procedures agreed to   ? Wound Properties Date First Assessed: 04/26/21 Time First Assessed: 0938 Wound Type: Non-pressure wound Location: Leg Location Orientation: Right Wound Description (Comments): superior to original wound Present on Admission: No  ? Wound Image Images linked: 1   ? Dressing Type Santyl;Compression wrap   Santyl in 2x2, lymph liner, profore lite, with ABD pad for shape  ? Dressing Changed Changed   ? Dressing  Status Old drainage   ? Dressing Change Frequency PRN   ? Site / Wound Assessment Yellow;Red   ? % Wound base Red or Granulating 55%   ? % Wound base Yellow/Fibrinous Exudate 45%   ? Wound Length (cm) 2 cm   ? Wound Width (cm) 1.8 cm   ? Wound Depth (cm) 0.2 cm   ? Wound Volume (cm^3) 0.72 cm^3   ? Wound Surface Area (cm^2) 3.6 cm^2   ? Margins Attached edges (approximated)   ? Drainage Amount Minimal   ? Drainage Description Serous   ? Treatment Cleansed;Debridement (Selective)   ? Wound Properties Date First Assessed: 12/04/20 Time First Assessed: 1937 Wound Type: Venous stasis ulcer Location: Leg Location Orientation: Right Present on Admission: Yes  ? Wound Image Images linked: 1   ? Dressing Type Santyl;Compression wrap   ? Dressing Changed Changed   ? Dressing Status Old drainage   ? Dressing Change Frequency PRN   ? Site / Wound Assessment Pink;Red   ? % Wound base Red or Granulating 75%   ? % Wound base Yellow/Fibrinous Exudate 25%   ? Wound Length (cm) 4 cm   ? Wound Width (cm) 1.8 cm   ? Wound Depth (cm) 0.2 cm   ?  Wound Volume (cm^3) 1.44 cm^3   ? Wound Surface Area (cm^2) 7.2 cm^2   ? Margins Attached edges (approximated)   ? Drainage Amount Minimal   ? Drainage Description Serous   ? Treatment Cleansed;Debridement (Selective)   ? Selective Debridement - Location B wound beds   ? Selective Debridement - Tools Used Forceps;Scalpel   ? Selective Debridement - Tissue Removed slough   ? [place] 100% in wound bed prior debridement with increased ease of slough removed inside wound bed with improved granulation tissue following.  Continued with santly, hydrogel and profore lite.  Noted increased edema today.  Encouraged pt to elevate LE and complete ankle pumps to assist iwht edema.   ? Wound Therapy - Functional Problem List gait and balance   ? Factors Delaying/Impairing Wound Healing Diabetes Mellitus;Vascular compromise;Altered sensation   ? Hydrotherapy Plan --   Trial wiht laser when machine returns to increase wound healing  ? Wound Therapy - Frequency 2X / week   ? Wound Plan Trial laser for wound healing when machine returns.  Continue wound care with appropriate dressings and measure weekly   ? Dressing  cleanse with wet gauze, lotion, santyl to wounds, 2x2, thicker sockinette followed by  profore lite, #5 netting   ? ?  ?  ? ?  ? ? ? ? ? ? ? ? ? ? ? ? PT Short Term Goals - 12/20/21 1557   ? ?  ? PT SHORT TERM GOAL #1  ? Title Patient will have at least 50% granualtion tissue.   ? Status Achieved   ? ?  ?  ? ?  ? ? ? ? PT Long Term Goals - 12/20/21 1558   ? ?  ? PT LONG TERM GOAL #1  ? Title Patient's wound will be healed to reduce the risk of infection.   ? Status On-going   ?  ? PT LONG TERM GOAL #2  ? Title Patient will be able to verbalize completion of daily skin checks to reduce the risk of further wound development.   ? Status On-going   ? ?  ?  ? ?  ? ? ? ? ? ? ? ? ? ?  Patient will benefit from skilled therapeutic intervention in order to improve the following deficits and  impairments:    ? ?Visit Diagnosis: ?Other abnormalities of gait and mobility ? ?Ulcer of right pretibial region, limited to breakdown of skin ([place]) ? ? ? ? ?Problem List ?Patient Active Problem List  ? Diagnosis Date Noted  ? Advanced nonexudative age-related macular degeneration of right eye without subfoveal involvement 11/15/2020  ? Advanced nonexudative age-related macular degeneration of left eye without subfoveal involvement 11/15/2020  ? Diabetes mellitus without complication ([place]) 81/82/9937  ? Posterior vitreous detachment of both eyes 11/15/2020  ? Gait disturbance, post-stroke 11/02/2017  ? Aphasia as late effect of cerebrovascular accident 11/02/2017  ? 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 Aphasia   ? PAF (paroxysmal atrial fibrillation) ([place])   ? Diabetes mellitus type 2 in obese [facility])   ? Benign essential HTN   ? Hypothyroidism   ? Hyperlipidemia   ? Stroke (cerebrum) ([place]) 10/02/2017  ? Afib ([place]) 01/10/2017  ? SOB (shortness of breath) 01/10/2017  ? Acute diastolic CHF (congestive heart failure) ([place]) 01/10/2017  ? Atrial fibrillation with RVR ([place]) 01/10/2017  ? Claudication ([place]) 07/25/2016  ? Paroxysmal atrial fibrillation ([place]) 07/25/2016  ? Odynophagia 04/10/2014  ? Allergic rhinitis 03/23/2014  ? Chronic rhinitis 12/22/2013  ? Intrinsic asthma 07/15/2013  ? Constipation 08/22/2011  ? Obesity 09/20/2009  ? G E R D 11/29/2007  ? Celiac disease 11/29/2007  ? ?Ihor Austin, LPTA/CLT; CBIS ?[phone] ? ?Aldona Lento, PTA ?12/24/2021, 3:57 PM ? ?[place] ?[place] ?[address] ?[place], [place], [address] ?Phone: [phone]   Fax:  [phone] ? ?Name: [name] ?MRN: [id number] ?Date of Birth: Nov 27, 1928 ? ? ? ? ?

## 2021-12-26 ENCOUNTER — Ambulatory Visit (INDEPENDENT_AMBULATORY_CARE_PROVIDER_SITE_OTHER): Payer: PPO | Admitting: Gastroenterology

## 2021-12-26 DIAGNOSIS — E119 Type 2 diabetes mellitus without complications: Secondary | ICD-10-CM | POA: Diagnosis not present

## 2021-12-27 ENCOUNTER — Other Ambulatory Visit: Payer: Self-pay

## 2021-12-27 ENCOUNTER — Encounter (HOSPITAL_COMMUNITY): Payer: Self-pay | Admitting: Physical Therapy

## 2021-12-27 ENCOUNTER — Ambulatory Visit (HOSPITAL_COMMUNITY): Payer: PPO | Admitting: Physical Therapy

## 2021-12-27 DIAGNOSIS — R2689 Other abnormalities of gait and mobility: Secondary | ICD-10-CM | POA: Diagnosis not present

## 2021-12-27 DIAGNOSIS — L97811 Non-pressure chronic ulcer of other part of right lower leg limited to breakdown of skin: Secondary | ICD-10-CM

## 2021-12-27 NOTE — Therapy (Signed)
[place] [facility] ?[address] ?[place], [place], [address] ?Phone: [phone]   Fax:  [phone] ? ?Wound Care Therapy ? ?Patient Details  ?Name: [name] ?MRN: [id number] ?Date of Birth: 02/02/1929 ?Referring Provider (PT): [name] ? ? ?Encounter Date: 12/27/2021 ? ? PT End of Session - 12/27/21 1537   ? ? Visit Number [id number]   ? Number of Visits 122   ? Date for PT Re-Evaluation 01/08/22   ? Authorization Type [employer] (no auth req, no visit limit)   ? Progress Note Due on Visit 122   ? PT Start Time 1450   ? PT Stop Time 1520   ? PT Time Calculation (min) 30 min   ? Activity Tolerance Patient tolerated treatment well   ? Behavior During Therapy [facility] for tasks assessed/performed   ? ?  ?  ? ?  ? ? ?Past Medical History:  ?Diagnosis Date  ? Allergic rhinitis   ? Aortic stenosis   ? Asthmatic bronchitis   ? Celiac disease   ? Cellulitis   ? CKD (chronic kidney disease)   ? Diverticulosis   ? Fx. left wrist 1987  ? Gastric polyps   ? GERD (gastroesophageal reflux disease)   ? History of pneumonia   ? Hypertension   ? Hypothyroidism   ? Iron deficiency anemia   ? Melanoma ([place])   ? Neuropathy   ? PAF (paroxysmal atrial fibrillation) ([place])   ? Prostate cancer ([place])   ? REM sleep behavior disorder   ? RLS (restless legs syndrome)   ? Sleep apnea   ? Spasticity   ? Stroke [facility]) 09/2017  ? Tremor   ? Type 2 diabetes mellitus ([place])   ? ? ?Past Surgical History:  ?Procedure Laterality Date  ? APPENDECTOMY  1978  ? CARPAL TUNNEL RELEASE Left   ? CATARACT EXTRACTION  03/2011  ? bilateral   ? CHOLECYSTECTOMY  2001  ? COLONOSCOPY W/ BIOPSIES  04/27/2008  ? diverticulosis, duodenitis  ? FOOT SURGERY    ? HERNIA REPAIR  1994  ? bilateral  ? KNEE SURGERY Bilateral   ? x 2  ? LUNG SURGERY  2001  ? for infection  ? PROSTATECTOMY  2002  ? UPPER GASTROINTESTINAL ENDOSCOPY  04/27/2008  ? celiac disease, gastric polyps  ? ? ?There were no vitals filed for this  visit. ? ? ? ? ? ? ? ? ? ? ? ? ? ? Wound Therapy - 12/27/21 0001   ? ? Subjective PT states that his leg hurt for a day after last treatment but then it felt better.  No pain now   ? Patient and Family Stated Goals wound to heal   ? Date of Onset 09/06/20   ? Prior Treatments MD and self care   ? Pain Scale 0-10   ? Pain Score 0-No pain   ? Evaluation and Treatment Procedures Explained to Patient/Family Yes   ? Evaluation and Treatment Procedures agreed to   ? Wound Properties Date First Assessed: 04/26/21 Time First Assessed: 2458 Wound Type: Non-pressure wound Location: Leg Location Orientation: Right Wound Description (Comments): superior to original wound Present on Admission: No  ? Dressing Type Santyl;Compression wrap   Santyl in 2x2, lymph liner, profore lite, with ABD pad for shape  ? Dressing Changed Changed   ? Dressing Status Old drainage   ? Dressing Change Frequency PRN   ? Site / Wound Assessment Yellow;Red   ? %  Wound base Red or Granulating 70%   after debridement  ? % Wound base Yellow/Fibrinous Exudate 30%   ? Margins Attached edges (approximated)   ? Drainage Amount Minimal   ? Drainage Description Serous   ? Treatment Cleansed;Debridement (Selective)   ? Wound Properties Date First Assessed: 12/04/20 Time First Assessed: 6967 Wound Type: Venous stasis ulcer Location: Leg Location Orientation: Right Present on Admission: Yes  ? Dressing Type Santyl;Compression wrap   ? Dressing Changed Changed   ? Dressing Status Old drainage   ? Dressing Change Frequency PRN   ? Site / Wound Assessment Pink;Red   ? % Wound base Red or Granulating 75%   ? % Wound base Yellow/Fibrinous Exudate 25%   ? Margins Attached edges (approximated)   ? Drainage Amount Minimal   ? Drainage Description Serous   ? Selective Debridement - Location B wound beds   ? Selective Debridement - Tools Used Forceps;Scalpel   ? Selective Debridement - Tissue Removed slough   ? [place] continutes to be  100% in wound bed prior debridement with increased ease of slough removed inside wound bed with improved granulation tissue in superior wound  following.  Continued with santly, hydrogel and profore lite.  Noted increased edema today.  Encouraged pt to elevate LE and complete ankle pumps to assist iwht edema.   ? Wound Therapy - Functional Problem List gait and balance   ? Factors Delaying/Impairing Wound Healing Diabetes Mellitus;Vascular compromise;Altered sensation   ? Hydrotherapy Plan --   Trial wiht laser when machine returns to increase wound healing  ? Wound Therapy - Frequency 2X / week   ? Wound Plan Trial laser for wound healing when machine returns.  Continue wound care with appropriate dressings and measure weekly   ? Dressing  cleanse with wet gauze, lotion, santyl to wounds, 2x2, thicker sockinette followed by  profore lite, #5 netting   ? ?  ?  ? ?  ? ? ? ? ? ? ? ? ? ? ? ? PT Short Term Goals - 12/20/21 1557   ? ?  ? PT SHORT TERM GOAL #1  ? Title Patient will have at least 50% granualtion tissue.   ? Status Achieved   ? ?  ?  ? ?  ? ? ? ? PT Long Term Goals - 12/20/21 1558   ? ?  ? PT LONG TERM GOAL #1  ? Title Patient's wound will be healed to reduce the risk of infection.   ? Status On-going   ?  ? PT LONG TERM GOAL #2  ? Title Patient will be able to verbalize completion of daily skin checks to reduce the risk of further wound development.   ? Status On-going   ? ?  ?  ? ?  ? ? ? ? ? ? ? ? Plan - 12/27/21 1542   ? ? Clinical Impression Statement see above   ? Personal Factors and Comorbidities Age;Comorbidity 3+;Past/Current Experience;Fitness;Time since onset of injury/illness/exacerbation   ? Comorbidities DM, CKD, neuorpathy, Hx CVA   ? Examination-Activity Limitations Locomotion Level;Transfers;Squat;Stand;Dressing   ? Examination-Participation Restrictions Community Activity   ? Stability/Clinical Decision Making Stable/Uncomplicated   ? Rehab Potential Good   ? PT Frequency 2x / week   ? PT  Duration 6 weeks   ? PT Treatment/Interventions Gait training;Stair training;Functional mobility training;DME Instruction;Therapeutic activities;Therapeutic exercise;Balance training;Neuromuscular re-education;Patient/family education;Compression bandaging;Manual lymph drainage;Manual techniques   ? PT Next Visit Plan Continue with  both chemical and mechanical debridement to remove adherent eschar to allow healing to occur   ? Consulted and Agree with Plan of Care Patient;Family member/caregiver   ? Family Member Consulted wife   ? ?  ?  ? ?  ? ? ?Patient will benefit from skilled therapeutic intervention in order to improve the following deficits and impairments:  Abnormal gait, Decreased skin integrity, Decreased mobility, Impaired sensation ? ?Visit Diagnosis: ?Other abnormalities of gait and mobility ? ?Ulcer of right pretibial region, limited to breakdown of skin ([place]) ? ? ? ? ?Problem List ?Patient Active Problem List  ? Diagnosis Date Noted  ? Advanced nonexudative age-related macular degeneration of right eye without subfoveal involvement 11/15/2020  ? Advanced nonexudative age-related macular degeneration of left eye without subfoveal involvement 11/15/2020  ? Diabetes mellitus without complication ([place]) 00/93/8182  ? Posterior vitreous detachment of both eyes 11/15/2020  ? Gait disturbance, post-stroke 11/02/2017  ? Aphasia as late effect of cerebrovascular accident 11/02/2017  ? 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 Aphasia   ? PAF (paroxysmal atrial fibrillation) ([place])   ? Diabetes mellitus type 2 in obese [facility])   ? Benign essential HTN   ? Hypothyroidism   ? Hyperlipidemia   ? Stroke (cerebrum) ([place]) 10/02/2017  ? Afib ([place]) 01/10/2017  ? SOB (shortness of breath) 01/10/2017  ? Acute diastolic CHF (congestive heart failure) ([place]) 01/10/2017  ? Atrial fibrillation with RVR ([place]) 01/10/2017  ? Claudication ([place]) 07/25/2016  ? Paroxysmal atrial fibrillation ([place]) 07/25/2016  ? Odynophagia  04/10/2014  ? Allergic rhinitis 03/23/2014  ? Chronic rhinitis 12/22/2013  ? Intrinsic asthma 07/15/2013  ? Constipation 08/22/2011  ? Obesity 09/20/2009  ? G E R D 11/29/2007  ? Celiac disease 11/29/2007  ? ? ?Rayetta Humphrey

## 2021-12-30 DIAGNOSIS — D508 Other iron deficiency anemias: Secondary | ICD-10-CM | POA: Diagnosis not present

## 2021-12-30 DIAGNOSIS — I129 Hypertensive chronic kidney disease with stage 1 through stage 4 chronic kidney disease, or unspecified chronic kidney disease: Secondary | ICD-10-CM | POA: Diagnosis not present

## 2021-12-30 DIAGNOSIS — E1129 Type 2 diabetes mellitus with other diabetic kidney complication: Secondary | ICD-10-CM | POA: Diagnosis not present

## 2021-12-30 DIAGNOSIS — E1122 Type 2 diabetes mellitus with diabetic chronic kidney disease: Secondary | ICD-10-CM | POA: Diagnosis not present

## 2021-12-30 DIAGNOSIS — I10 Essential (primary) hypertension: Secondary | ICD-10-CM | POA: Diagnosis not present

## 2021-12-30 DIAGNOSIS — R809 Proteinuria, unspecified: Secondary | ICD-10-CM | POA: Diagnosis not present

## 2021-12-30 DIAGNOSIS — D638 Anemia in other chronic diseases classified elsewhere: Secondary | ICD-10-CM | POA: Diagnosis not present

## 2021-12-30 DIAGNOSIS — N189 Chronic kidney disease, unspecified: Secondary | ICD-10-CM | POA: Diagnosis not present

## 2021-12-30 DIAGNOSIS — E211 Secondary hyperparathyroidism, not elsewhere classified: Secondary | ICD-10-CM | POA: Diagnosis not present

## 2021-12-31 ENCOUNTER — Ambulatory Visit (HOSPITAL_COMMUNITY): Payer: PPO | Admitting: Physical Therapy

## 2021-12-31 ENCOUNTER — Other Ambulatory Visit: Payer: Self-pay

## 2021-12-31 DIAGNOSIS — R2689 Other abnormalities of gait and mobility: Secondary | ICD-10-CM

## 2021-12-31 DIAGNOSIS — L97811 Non-pressure chronic ulcer of other part of right lower leg limited to breakdown of skin: Secondary | ICD-10-CM

## 2021-12-31 NOTE — Therapy (Signed)
[place] [facility] ?[address] ?[place], [place], [address] ?Phone: [phone]   Fax:  [phone] ? ?Wound Care Therapy ? ?Patient Details  ?Name: [name] ?MRN: [id number] ?Date of Birth: 1929/07/01 ?Referring Provider (PT): [name] ? ? ?Encounter Date: 12/31/2021 ? ? PT End of Session - 12/31/21 1530   ? ? Visit Number [id number]   ? Number of Visits 122   ? Date for PT Re-Evaluation 01/08/22   ? Authorization Type [employer] (no auth req, no visit limit)   ? Progress Note Due on Visit 122   ? PT Start Time 1450   ? PT Stop Time 1525   ? PT Time Calculation (min) 35 min   ? Activity Tolerance Patient tolerated treatment well   ? Behavior During Therapy [facility] for tasks assessed/performed   ? ?  ?  ? ?  ? ? ?Past Medical History:  ?Diagnosis Date  ? Allergic rhinitis   ? Aortic stenosis   ? Asthmatic bronchitis   ? Celiac disease   ? Cellulitis   ? CKD (chronic kidney disease)   ? Diverticulosis   ? Fx. left wrist 1987  ? Gastric polyps   ? GERD (gastroesophageal reflux disease)   ? History of pneumonia   ? Hypertension   ? Hypothyroidism   ? Iron deficiency anemia   ? Melanoma ([place])   ? Neuropathy   ? PAF (paroxysmal atrial fibrillation) ([place])   ? Prostate cancer ([place])   ? REM sleep behavior disorder   ? RLS (restless legs syndrome)   ? Sleep apnea   ? Spasticity   ? Stroke [facility]) 09/2017  ? Tremor   ? Type 2 diabetes mellitus ([place])   ? ? ?Past Surgical History:  ?Procedure Laterality Date  ? APPENDECTOMY  1978  ? CARPAL TUNNEL RELEASE Left   ? CATARACT EXTRACTION  03/2011  ? bilateral   ? CHOLECYSTECTOMY  2001  ? COLONOSCOPY W/ BIOPSIES  04/27/2008  ? diverticulosis, duodenitis  ? FOOT SURGERY    ? HERNIA REPAIR  1994  ? bilateral  ? KNEE SURGERY Bilateral   ? x 2  ? LUNG SURGERY  2001  ? for infection  ? PROSTATECTOMY  2002  ? UPPER GASTROINTESTINAL ENDOSCOPY  04/27/2008  ? celiac disease, gastric polyps  ? ? ?There were no vitals filed for this  visit. ? ? ? ? ? ? ? ? ? ? ? ? ? ? Wound Therapy - 12/31/21 0001   ? ? Subjective Pt states that his leg is feeling well   ? Patient and Family Stated Goals wound to heal   ? Date of Onset 09/06/20   ? Prior Treatments MD and self care   ? Pain Scale 0-10   ? Pain Score 0-No pain   ? Evaluation and Treatment Procedures Explained to Patient/Family Yes   ? Evaluation and Treatment Procedures agreed to   ? Wound Properties Date First Assessed: 04/26/21 Time First Assessed: 1607 Wound Type: Non-pressure wound Location: Leg Location Orientation: Right Wound Description (Comments): superior to original wound Present on Admission: No  ? Wound Image Images linked: 1   ? Dressing Type Santyl;Compression wrap   Santyl in 2x2, lymph liner, profore lite, with ABD pad for shape  ? Dressing Status Old drainage   ? Dressing Change Frequency PRN   ? Site / Wound Assessment Yellow;Red   ? % Wound base Red or Granulating 60%   ? % Wound  base Yellow/Fibrinous Exudate 40%   ? Margins Attached edges (approximated)   ? Drainage Amount Minimal   ? Drainage Description Serous   ? Treatment Cleansed;Debridement (Selective)   ? Wound Properties Date First Assessed: 12/04/20 Time First Assessed: 8676 Wound Type: Venous stasis ulcer Location: Leg Location Orientation: Right Present on Admission: Yes  ? Dressing Type Santyl;Compression wrap   ? Dressing Changed Changed   ? Dressing Status Old drainage   ? Dressing Change Frequency PRN   ? Site / Wound Assessment Pink;Red   ? % Wound base Red or Granulating 65%   ? % Wound base Yellow/Fibrinous Exudate 35%   ? Margins Attached edges (approximated)   ? Drainage Amount Moderate   ? Drainage Description Serous   ? Treatment Cleansed;Debridement (Selective)   ? Selective Debridement - Location B wound beds   ? Selective Debridement - Tools Used Forceps;Scalpel   ? Selective Debridement - Tissue Removed slough   ? Wound Therapy - Clinical Statement Increased slough today as well as increased drainage  from posterior wound.  Periwound is more dry than normal as well therefore extra moisture placed on LE>   ? Wound Therapy - Functional Problem List gait and balance   ? Factors Delaying/Impairing Wound Healing Diabetes Mellitus;Vascular compromise;Altered sensation   ? Hydrotherapy Plan --   Trial wiht laser when machine returns to increase wound healing  ? Wound Therapy - Frequency 2X / week   ? Wound Plan Trial laser for wound healing when machine returns.  Continue wound care with appropriate dressings and measure weekly   ? Dressing  cleanse with wet gauze,vaseline,  lotion, santyl to wounds, 2x2, thicker sockinette followed by  profore lite, #5 netting   ? ?  ?  ? ?  ? ? ? ? ? ? ? ? ? ? ? ? PT Short Term Goals - 12/31/21 1537   ? ?  ? PT SHORT TERM GOAL #1  ? Title Patient will have at least 50% granualtion tissue.   ? Status Achieved   ? ?  ?  ? ?  ? ? ? ? PT Long Term Goals - 12/31/21 1537   ? ?  ? PT LONG TERM GOAL #1  ? Title Patient's wound will be healed to reduce the risk of infection.   ? Status On-going   ?  ? PT LONG TERM GOAL #2  ? Title Patient will be able to verbalize completion of daily skin checks to reduce the risk of further wound development.   ? Status On-going   ? ?  ?  ? ?  ? ? ? ? ? ? ? ? Plan - 12/31/21 1537   ? ? Personal Factors and Comorbidities Age;Comorbidity 3+;Past/Current Experience;Fitness;Time since onset of injury/illness/exacerbation   ? Comorbidities DM, CKD, neuorpathy, Hx CVA   ? Examination-Activity Limitations Locomotion Level;Transfers;Squat;Stand;Dressing   ? Examination-Participation Restrictions Community Activity   ? Stability/Clinical Decision Making Stable/Uncomplicated   ? Rehab Potential Good   ? PT Frequency 2x / week   ? PT Duration 6 weeks   ? PT Treatment/Interventions Gait training;Stair training;Functional mobility training;DME Instruction;Therapeutic activities;Therapeutic exercise;Balance training;Neuromuscular re-education;Patient/family  education;Compression bandaging;Manual lymph drainage;Manual techniques   ? PT Next Visit Plan Continue with both chemical and mechanical debridement to remove adherent eschar to allow healing to occur   ? Consulted and Agree with Plan of Care Patient;Family member/caregiver   ? Family Member Consulted wife   ? ?  ?  ? ?  ? ? ?  Patient will benefit from skilled therapeutic intervention in order to improve the following deficits and impairments:  Abnormal gait, Decreased skin integrity, Decreased mobility, Impaired sensation ? ?Visit Diagnosis: ?Other abnormalities of gait and mobility ? ?Ulcer of right pretibial region, limited to breakdown of skin ([place]) ? ? ? ? ?Problem List ?Patient Active Problem List  ? Diagnosis Date Noted  ? Advanced nonexudative age-related macular degeneration of right eye without subfoveal involvement 11/15/2020  ? Advanced nonexudative age-related macular degeneration of left eye without subfoveal involvement 11/15/2020  ? Diabetes mellitus without complication ([place]) 60/07/9322  ? Posterior vitreous detachment of both eyes 11/15/2020  ? Gait disturbance, post-stroke 11/02/2017  ? Aphasia as late effect of cerebrovascular accident 11/02/2017  ? 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 Aphasia   ? PAF (paroxysmal atrial fibrillation) ([place])   ? Diabetes mellitus type 2 in obese [facility])   ? Benign essential HTN   ? Hypothyroidism   ? Hyperlipidemia   ? Stroke (cerebrum) ([place]) 10/02/2017  ? Afib ([place]) 01/10/2017  ? SOB (shortness of breath) 01/10/2017  ? Acute diastolic CHF (congestive heart failure) ([place]) 01/10/2017  ? Atrial fibrillation with RVR ([place]) 01/10/2017  ? Claudication ([place]) 07/25/2016  ? Paroxysmal atrial fibrillation ([place]) 07/25/2016  ? Odynophagia 04/10/2014  ? Allergic rhinitis 03/23/2014  ? Chronic rhinitis 12/22/2013  ? Intrinsic asthma 07/15/2013  ? Constipation 08/22/2011  ? Obesity 09/20/2009  ? G E R D 11/29/2007  ? Celiac disease 11/29/2007  ? ? ?Rayetta Humphrey, PT CLT ?[phone]  ?12/31/2021, 3:38 PM ? ?[place] [facility] ?[address] ?[place], [place], [address] ?Phone: [phone]   Fax:  [phone] ? ?Name: KIPPY GOHMAN ?MRN: [id number] ?Date of B

## 2022-01-02 DIAGNOSIS — E1129 Type 2 diabetes mellitus with other diabetic kidney complication: Secondary | ICD-10-CM | POA: Diagnosis not present

## 2022-01-02 DIAGNOSIS — N189 Chronic kidney disease, unspecified: Secondary | ICD-10-CM | POA: Diagnosis not present

## 2022-01-02 DIAGNOSIS — R809 Proteinuria, unspecified: Secondary | ICD-10-CM | POA: Diagnosis not present

## 2022-01-02 DIAGNOSIS — D638 Anemia in other chronic diseases classified elsewhere: Secondary | ICD-10-CM | POA: Diagnosis not present

## 2022-01-02 DIAGNOSIS — E1122 Type 2 diabetes mellitus with diabetic chronic kidney disease: Secondary | ICD-10-CM | POA: Diagnosis not present

## 2022-01-02 DIAGNOSIS — E211 Secondary hyperparathyroidism, not elsewhere classified: Secondary | ICD-10-CM | POA: Diagnosis not present

## 2022-01-02 DIAGNOSIS — I5032 Chronic diastolic (congestive) heart failure: Secondary | ICD-10-CM | POA: Diagnosis not present

## 2022-01-02 DIAGNOSIS — I129 Hypertensive chronic kidney disease with stage 1 through stage 4 chronic kidney disease, or unspecified chronic kidney disease: Secondary | ICD-10-CM | POA: Diagnosis not present

## 2022-01-02 DIAGNOSIS — I350 Nonrheumatic aortic (valve) stenosis: Secondary | ICD-10-CM | POA: Diagnosis not present

## 2022-01-03 ENCOUNTER — Encounter (HOSPITAL_COMMUNITY): Payer: Self-pay | Admitting: Physical Therapy

## 2022-01-03 ENCOUNTER — Ambulatory Visit (HOSPITAL_COMMUNITY): Payer: PPO | Admitting: Physical Therapy

## 2022-01-03 ENCOUNTER — Other Ambulatory Visit: Payer: Self-pay

## 2022-01-03 DIAGNOSIS — R2689 Other abnormalities of gait and mobility: Secondary | ICD-10-CM | POA: Diagnosis not present

## 2022-01-03 DIAGNOSIS — L97811 Non-pressure chronic ulcer of other part of right lower leg limited to breakdown of skin: Secondary | ICD-10-CM

## 2022-01-03 NOTE — Therapy (Signed)
[facility] ?[address] ?[place], [place], [address] ?Phone: [phone]   Fax:  [phone] ? ?Wound Care Therapy ? ?Patient Details  ?Name: [name] ?MRN: [id number] ?Date of Birth: [birth year] ?Referring Provider (PT): [name] ? ? ?Encounter Date: 01/03/2022 ? ? PT End of Session - 01/03/22 1606   ? ? Visit Number [id number]   ? Number of Visits 122   ? Date for PT Re-Evaluation 01/08/22   ? Authorization Type [employer] (no auth req, no visit limit)   ? Progress Note Due on Visit 122   ? PT Start Time 1450   ? PT Stop Time 1530   ? PT Time Calculation (min) 40 min   ? Activity Tolerance Patient tolerated treatment well   ? Behavior During Therapy [facility] for tasks assessed/performed   ? ?  ?  ? ?  ? ? ?Past Medical History:  ?Diagnosis Date  ? Allergic rhinitis   ? Aortic stenosis   ? Asthmatic bronchitis   ? Celiac disease   ? Cellulitis   ? CKD (chronic kidney disease)   ? Diverticulosis   ? Fx. left wrist 1987  ? Gastric polyps   ? GERD (gastroesophageal reflux disease)   ? History of pneumonia   ? Hypertension   ? Hypothyroidism   ? Iron deficiency anemia   ? Melanoma ([place])   ? Neuropathy   ? PAF (paroxysmal atrial fibrillation) ([place])   ? Prostate cancer ([place])   ? REM sleep behavior disorder   ? RLS (restless legs syndrome)   ? Sleep apnea   ? Spasticity   ? Stroke [facility]) 09/2017  ? Tremor   ? Type 2 diabetes mellitus ([place])   ? ? ?Past Surgical History:  ?Procedure Laterality Date  ? APPENDECTOMY  1978  ? CARPAL TUNNEL RELEASE Left   ? CATARACT EXTRACTION  03/2011  ? bilateral   ? CHOLECYSTECTOMY  2001  ? COLONOSCOPY W/ BIOPSIES  04/27/2008  ? diverticulosis, duodenitis  ? FOOT SURGERY    ? HERNIA REPAIR  1994  ? bilateral  ? KNEE SURGERY Bilateral   ? x 2  ? LUNG SURGERY  2001  ? for infection  ? PROSTATECTOMY  2002  ? UPPER GASTROINTESTINAL ENDOSCOPY  04/27/2008  ? celiac disease, gastric polyps  ? ? ?There were no vitals filed for this  visit. ? ? ? ? ? ? ? ? ? ? ? ? ? ? Wound Therapy - 01/03/22 0001   ? ? Subjective Pt has no complaint   ? Patient and Family Stated Goals wound to heal   ? Date of Onset 09/06/20   ? Prior Treatments MD and self care   ? Pain Scale 0-10   ? Pain Score 0-No pain   ? Evaluation and Treatment Procedures Explained to Patient/Family Yes   ? Evaluation and Treatment Procedures agreed to   ? Wound Properties Date First Assessed: 04/26/21 Time First Assessed: 7628 Wound Type: Non-pressure wound Location: Leg Location Orientation: Right Wound Description (Comments): superior to original wound Present on Admission: No  ? Dressing Type Santyl;Compression wrap   Santyl in 2x2, lymph liner, profore lite, with ABD pad for shape  ? Dressing Status Old drainage   ? Dressing Change Frequency PRN   ? Site / Wound Assessment Yellow;Red   ? % Wound base Red or Granulating 60%   ? % Wound base Yellow/Fibrinous Exudate 40%   ? Wound Length (cm) 2.1 cm   ?  Wound Width (cm) 1.7 cm   ? Wound Surface Area (cm^2) 3.57 cm^2   ? Margins Attached edges (approximated)   ? Drainage Amount Minimal   ? Drainage Description Serous   ? Treatment Cleansed;Debridement (Selective)   ? Wound Properties Date First Assessed: 12/04/20 Time First Assessed: 1761 Wound Type: Venous stasis ulcer Location: Leg Location Orientation: Right Present on Admission: Yes  ? Dressing Type Santyl;Compression wrap   ? Dressing Changed Changed   ? Dressing Status Old drainage   ? Dressing Change Frequency PRN   ? Site / Wound Assessment Pink;Red   ? % Wound base Red or Granulating 70%   ? % Wound base Yellow/Fibrinous Exudate 30%   ? Wound Length (cm) 3.9 cm   ? Wound Width (cm) 1.5 cm   ? Wound Surface Area (cm^2) 5.85 cm^2   ? Margins Attached edges (approximated)   ? Drainage Amount Minimal   ? Drainage Description Serous   ? Treatment Cleansed;Debridement (Selective)   ? Selective Debridement - Location B wound beds   ? Selective Debridement - Tools Used Forceps;Scalpel    ? Selective Debridement - Tissue Removed slough   ? Wound Therapy - Clinical Statement Pt wound continues to be almost 100% slogh prior to debridement. Following debridement pt continues to have increased granulation.   ? Wound Therapy - Functional Problem List gait and balance   ? Factors Delaying/Impairing Wound Healing Diabetes Mellitus;Vascular compromise;Altered sensation   ? Hydrotherapy Plan --   Trial wiht laser when machine returns to increase wound healing  ? Wound Therapy - Frequency 2X / week   ? Wound Plan Trial laser for wound healing when machine returns.  Continue wound care with appropriate dressings and measure weekly   ? Dressing  cleanse with wet gauze,vaseline,  lotion, santyl to wounds, 2x2, thicker sockinette followed by  profore lite, #5 netting   ? ?  ?  ? ?  ? ? ? ? ? ? ? ? ? ? ? ? PT Short Term Goals - 01/03/22 1613   ? ?  ? PT SHORT TERM GOAL #1  ? Title Patient will have at least 50% granualtion tissue.   ? Status Achieved   ? ?  ?  ? ?  ? ? ? ? PT Long Term Goals - 01/03/22 1613   ? ?  ? PT LONG TERM GOAL #1  ? Title Patient's wound will be healed to reduce the risk of infection.   ? Status On-going   ?  ? PT LONG TERM GOAL #2  ? Title Patient will be able to verbalize completion of daily skin checks to reduce the risk of further wound development.   ? Status On-going   ? ?  ?  ? ?  ? ? ? ? ? ? ? ? Plan - 01/03/22 1613   ? ? Clinical Impression Statement as above   ? Personal Factors and Comorbidities Age;Comorbidity 3+;Past/Current Experience;Fitness;Time since onset of injury/illness/exacerbation   ? Comorbidities DM, CKD, neuorpathy, Hx CVA   ? Examination-Activity Limitations Locomotion Level;Transfers;Squat;Stand;Dressing   ? Examination-Participation Restrictions Community Activity   ? Stability/Clinical Decision Making Stable/Uncomplicated   ? Rehab Potential Good   ? PT Frequency 2x / week   ? PT Duration 6 weeks   ? PT Treatment/Interventions Gait training;Stair  training;Functional mobility training;DME Instruction;Therapeutic activities;Therapeutic exercise;Balance training;Neuromuscular re-education;Patient/family education;Compression bandaging;Manual lymph drainage;Manual techniques   ? PT Next Visit Plan Continue with both chemical and mechanical debridement to remove adherent  eschar to allow healing to occur   ? Consulted and Agree with Plan of Care Patient;Family member/caregiver   ? Family Member Consulted wife   ? ?  ?  ? ?  ? ? ?Patient will benefit from skilled therapeutic intervention in order to improve the following deficits and impairments:  Abnormal gait, Decreased skin integrity, Decreased mobility, Impaired sensation ? ?Visit Diagnosis: ?Other abnormalities of gait and mobility ? ?Ulcer of right pretibial region, limited to breakdown of skin ([place]) ? ? ? ? ?Problem List ?Patient Active Problem List  ? Diagnosis Date Noted  ? Advanced nonexudative age-related macular degeneration of right eye without subfoveal involvement 11/15/2020  ? Advanced nonexudative age-related macular degeneration of left eye without subfoveal involvement 11/15/2020  ? Diabetes mellitus without complication ([place]) 54/06/8118  ? Posterior vitreous detachment of both eyes 11/15/2020  ? Gait disturbance, post-stroke 11/02/2017  ? Aphasia as late effect of cerebrovascular accident 11/02/2017  ? 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 Aphasia   ? PAF (paroxysmal atrial fibrillation) ([place])   ? Diabetes mellitus type 2 in obese [facility])   ? Benign essential HTN   ? Hypothyroidism   ? Hyperlipidemia   ? Stroke (cerebrum) ([place]) 10/02/2017  ? Afib ([place]) 01/10/2017  ? SOB (shortness of breath) 01/10/2017  ? Acute diastolic CHF (congestive heart failure) ([place]) 01/10/2017  ? Atrial fibrillation with RVR ([place]) 01/10/2017  ? Claudication ([place]) 07/25/2016  ? Paroxysmal atrial fibrillation ([place]) 07/25/2016  ? Odynophagia 04/10/2014  ? Allergic rhinitis 03/23/2014  ? Chronic rhinitis  12/22/2013  ? Intrinsic asthma 07/15/2013  ? Constipation 08/22/2011  ? Obesity 09/20/2009  ? G E R D 11/29/2007  ? Celiac disease 11/29/2007  ? ?Rayetta Humphrey, PT CLT ?[phone]  ?01/03/2022, 4:13 PM ? ?[facility]

## 2022-01-07 ENCOUNTER — Encounter (HOSPITAL_COMMUNITY): Payer: Self-pay

## 2022-01-07 ENCOUNTER — Ambulatory Visit (HOSPITAL_COMMUNITY): Payer: PPO

## 2022-01-07 ENCOUNTER — Other Ambulatory Visit: Payer: Self-pay

## 2022-01-07 DIAGNOSIS — L97811 Non-pressure chronic ulcer of other part of right lower leg limited to breakdown of skin: Secondary | ICD-10-CM

## 2022-01-07 DIAGNOSIS — R2689 Other abnormalities of gait and mobility: Secondary | ICD-10-CM | POA: Diagnosis not present

## 2022-01-07 NOTE — Therapy (Addendum)
[facility] ?[address] ?[place], [place], [address] ?Phone: [phone]   Fax:  [phone] ? ?Wound Care Therapy ?Progress Note ?Reporting Period 12/17/21 to 01/07/22 ? ?See note below for Objective Data and Assessment of Progress/Goals.  ? ? ? ?Patient Details  ?Name: [name] ?MRN: [id number] ?Date of Birth: 1928/11/27 ?Referring Provider (PT): [name] ? ? ?Encounter Date: 01/07/2022 ? ? PT End of Session - 01/07/22 1548   ? ? Visit Number [id number]   ? Number of Visits 138  ? Date for PT Re-Evaluation  03/07/2022  ? Authorization Type [employer] (no auth req, no visit limit)   ? Progress Note Due on Visit 122   ? PT Start Time 1450   ? PT Stop Time 1530   ? PT Time Calculation (min) 40 min   ? Activity Tolerance Patient tolerated treatment well   ? Behavior During Therapy [facility] for tasks assessed/performed   ? ?  ?  ? ?  ? ? ?Past Medical History:  ?Diagnosis Date  ? Allergic rhinitis   ? Aortic stenosis   ? Asthmatic bronchitis   ? Celiac disease   ? Cellulitis   ? CKD (chronic kidney disease)   ? Diverticulosis   ? Fx. left wrist 1987  ? Gastric polyps   ? GERD (gastroesophageal reflux disease)   ? History of pneumonia   ? Hypertension   ? Hypothyroidism   ? Iron deficiency anemia   ? Melanoma ([place])   ? Neuropathy   ? PAF (paroxysmal atrial fibrillation) ([place])   ? Prostate cancer ([place])   ? REM sleep behavior disorder   ? RLS (restless legs syndrome)   ? Sleep apnea   ? Spasticity   ? Stroke [facility]) 09/2017  ? Tremor   ? Type 2 diabetes mellitus ([place])   ? ? ?Past Surgical History:  ?Procedure Laterality Date  ? APPENDECTOMY  1978  ? CARPAL TUNNEL RELEASE Left   ? CATARACT EXTRACTION  03/2011  ? bilateral   ? CHOLECYSTECTOMY  2001  ? COLONOSCOPY W/ BIOPSIES  04/27/2008  ? diverticulosis, duodenitis  ? FOOT SURGERY    ? HERNIA REPAIR  1994  ? bilateral  ? KNEE SURGERY Bilateral   ? x 2  ? LUNG SURGERY  2001  ? for infection  ? PROSTATECTOMY  2002  ? UPPER  GASTROINTESTINAL ENDOSCOPY  04/27/2008  ? celiac disease, gastric polyps  ? ? ?There were no vitals filed for this visit. ? ? ? Subjective Assessment - 01/07/22 1542   ? ? Subjective Pt stated he has constant ache Rt LE, pain scale 4/10.  Arrived with dressings intact.   ? ?  ?  ? ?  ? ? ? ? ? ? ? ? ? ? ? ? Wound Therapy - 01/07/22 0001   ? ? Subjective Pt stated he has constant ache Rt LE, pain scale 4/10.  Arrived with dressings intact.   ? Patient and Family Stated Goals wound to heal   ? Date of Onset 09/06/20   ? Prior Treatments MD and self care   ? Pain Scale 0-10   ? Pain Score 4    ? Pain Type Chronic pain   ? Pain Location Leg   ? Pain Orientation Right;Distal;Lateral   ? Pain Descriptors / Indicators Aching;Burning   ? Patients Stated Pain Goal 0   ? Pain Intervention(s) Emotional support;Repositioned   ? Evaluation and Treatment Procedures Explained to Patient/Family  Yes   ? Evaluation and Treatment Procedures agreed to   ? Wound Properties Date First Assessed: 04/26/21 Time First Assessed: 2409 Wound Type: Non-pressure wound Location: Leg Location Orientation: Right Wound Description (Comments): superior to original wound Present on Admission: No  ? Wound Image Images linked: 1   ? Dressing Type Santyl;Compression wrap   Santyl in 2x2, lymph liner, profore lite, with ABD pad for shape  ? Dressing Status Old drainage   ? Dressing Change Frequency PRN   ? Site / Wound Assessment Yellow;Red   ? % Wound base Red or Granulating 65%   was 50%  ? % Wound base Yellow/Fibrinous Exudate 35%   ? Wound Length (cm) 2.2 cm    was 2.2  ? Wound Width (cm) 1.7 cm    was 1.8  ? Wound Depth (cm) 0.2 cm   ? Wound Volume (cm^3) 0.75 cm^3   ? Wound Surface Area (cm^2) 3.74 cm^2   ? Margins Attached edges (approximated)   ? Drainage Amount Minimal   ? Drainage Description Serous   ? Wound Properties Date First Assessed: 12/04/20 Time First Assessed: 7353 Wound Type: Venous stasis ulcer Location: Leg Location Orientation: Right  Present on Admission: Yes  ? Wound Image Images linked: 1   ? Dressing Type Santyl;Compression wrap   Santyl in 2x2, lymph liner, profore lite, with ABD pad for shape  ? Dressing Changed Changed   ? Dressing Status Old drainage   ? Dressing Change Frequency PRN   ? Site / Wound Assessment Pink;Red   ? % Wound base Red or Granulating 70%   was 70%   ? % Wound base Yellow/Fibrinous Exudate 30%   ? Wound Length (cm) 3.9 cm   was 4.3  ? Wound Width (cm) 1.5 cm    was 2   ? Wound Surface Area (cm^2) 5.85 cm^2   ? Margins Attached edges (approximated)   ? Drainage Amount Minimal   ? Drainage Description Serous   ? Treatment Cleansed;Debridement (Selective)   ? Selective Debridement - Location B wound beds   ? Selective Debridement - Tools Used Forceps;Scalpel   ? Selective Debridement - Tissue Removed slough   ? Wound Therapy - Clinical Statement Wounds continue to be 100% slough upon removal of dressings, increased ease with debridement and increased granulation tissue.  Continued wiht santyl and profore lite with additional lymph liner for skin integrity and ABD/extra cotton for proper shaping.   ? Wound Therapy - Functional Problem List gait and balance   ? Factors Delaying/Impairing Wound Healing Diabetes Mellitus;Vascular compromise;Altered sensation   ? Hydrotherapy Plan --   Laser has not arrived will trial Ultrasound to attempt to excel healing Trial laser for wound healing when machine returns.  ? Wound Therapy - Frequency 2X / week   ? Wound Plan Trial laser f/ultrasound or wound healing when machine returns.  Continue wound care with appropriate dressings and measure weekly   ? Dressing  cleanse with wet gauze,vaseline,  lotion, santyl to wounds, 2x2, thicker sockinette followed by  profore lite, #5 netting   ? ?  ?  ? ?  ? ? ? ? ? ? ? ? ? ? ? ? PT Short Term Goals - 01/07/22 1550   ? ?  ? PT SHORT TERM GOAL #1  ? Title Patient will have at least 50% granualtion tissue.   ? Status Achieved   ?  ? PT SHORT TERM  GOAL #2  ? Title Pt pain  to be decreased in both hips to no greater than a 6/10 to allow pt to be able to walk with his walker for 10 mintues    ? Status Achieved   ? ?  ?  ? ?  ? ? ? ? PT Long Term Goals - 01/07/22 1550   ? ?  ? PT LONG TERM GOAL #1  ? Title Patient's wound will be healed to reduce the risk of infection.   ? Status On-going   ?  ? PT LONG TERM GOAL #2  ? Title Patient will be able to verbalize completion of daily skin checks to reduce the risk of further wound development.   ? Status On-going   ? ?  ?  ? ?  ? ? ? ? ? ? ? ? ? ?Patient will benefit from skilled therapeutic intervention in order to improve the following deficits and impairments:    ? ?Visit Diagnosis: ?Other abnormalities of gait and mobility ? ?Ulcer of right pretibial region, limited to breakdown of skin ([place]) ? ? ? ? ?Problem List ?Patient Active Problem List  ? Diagnosis Date Noted  ? Advanced nonexudative age-related macular degeneration of right eye without subfoveal involvement 11/15/2020  ? Advanced nonexudative age-related macular degeneration of left eye without subfoveal involvement 11/15/2020  ? Diabetes mellitus without complication ([place]) 09/32/6712  ? Posterior vitreous detachment of both eyes 11/15/2020  ? Gait disturbance, post-stroke 11/02/2017  ? Aphasia as late effect of cerebrovascular accident 11/02/2017  ? 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 Aphasia   ? PAF (paroxysmal atrial fibrillation) ([place])   ? Diabetes mellitus type 2 in obese [facility])   ? Benign essential HTN   ? Hypothyroidism   ? Hyperlipidemia   ? Stroke (cerebrum) ([place]) 10/02/2017  ? Afib ([place]) 01/10/2017  ? SOB (shortness of breath) 01/10/2017  ? Acute diastolic CHF (congestive heart failure) ([place]) 01/10/2017  ? Atrial fibrillation with RVR ([place]) 01/10/2017  ? Claudication ([place]) 07/25/2016  ? Paroxysmal atrial fibrillation ([place]) 07/25/2016  ? Odynophagia 04/10/2014  ? Allergic rhinitis 03/23/2014  ? Chronic rhinitis 12/22/2013  ?  Intrinsic asthma 07/15/2013  ? Constipation 08/22/2011  ? Obesity 09/20/2009  ? G E R D 11/29/2007  ? Celiac disease 11/29/2007  ? ?Ihor Austin, LPTA/CLT; CBIS ?[phone] ?Rayetta Humphrey, PT CLT ?[phone]

## 2022-01-08 NOTE — Addendum Note (Signed)
Addended by: Leeroy Cha on: 01/08/2022 10:41 AM ? ? Modules accepted: Orders ? ?

## 2022-01-09 ENCOUNTER — Encounter (INDEPENDENT_AMBULATORY_CARE_PROVIDER_SITE_OTHER): Payer: Self-pay | Admitting: Gastroenterology

## 2022-01-09 ENCOUNTER — Ambulatory Visit (INDEPENDENT_AMBULATORY_CARE_PROVIDER_SITE_OTHER): Payer: PPO | Admitting: Gastroenterology

## 2022-01-09 VITALS — BP 121/40 | HR 59 | Temp 98.2°F | Ht 70.0 in | Wt 198.6 lb

## 2022-01-09 DIAGNOSIS — D508 Other iron deficiency anemias: Secondary | ICD-10-CM

## 2022-01-09 DIAGNOSIS — D649 Anemia, unspecified: Secondary | ICD-10-CM

## 2022-01-09 MED ORDER — FERROUS SULFATE 325 (65 FE) MG PO TABS: 325.0000 mg | ORAL_TABLET | Freq: Every day | ORAL | 2 refills | Status: DC

## 2022-01-09 NOTE — Patient Instructions (Addendum)
At this time I do not think we need to proceed with egd or colonoscopy as the risk would likely outweigh the benefits in your case ?Please continue to follow with Dr. Theador Hawthorne for monitoring of your hemoglobin and iron studies. I am going to start you on some oral iron to help maintain your iron levels, take this once a day with breakfast, please avoid dairy around the time you are taking this and note that it can cause you to have some constipation and darker stools ?Given your history of CKD and celiac disease, these in combination can cause anemia, if you start to have rectal bleeding, black stools, abdominal pain, shortness of breath, dizziness or fatigue, please let me know, or if iron levels or hemoglobin drop significantly, please let me know this as well. ? ?Continue with gluten free diet and diet high in iron rich foods ? ?Be mindful of using the diclofenac gel, as frequent use of this can sometimes cause more systemic absorption which can result in similar side effects to oral NSAIDs such as ibuprofen, aleve, naproxen, advil, goody powder ? ?Please keep an eye on your blood pressure, if it continues to be lower in the 40s and 50s on the bottom  number, please talk to your PCP or cardiologist as he is on metoprolol which may be lowering his BP too much. Keep an eye out for dizziness or lightheadedness is these are also signs BP may be dropping too low. ? ?Follow up 3 months ?

## 2022-01-09 NOTE — Progress Notes (Signed)
? ?Referring Provider: Celene Squibb, MD ?Primary Care Physician:  Celene Squibb, MD ?Primary GI Physician: new ? ?Chief Complaint  ?Patient presents with  ? Anemia  ?  Referred for anemia. Wife thinks iron infusion was back in December or January. Had it done at [facility]. Years ago had issue with bleeding from taking too much advil.   ? ?HPI:   ?[name] is a 88 y.o. male with past medical history of Celiac disease, CKD, gastric polyps, GERD HTN, Hypothyroidism, IDA, melanoma, PAF, prostate cancer, RLS, sleep apnea, stroke, Type 2 DM. ? ?Patient presenting today as a new patient for IDA ? ?Notably TIBC 239 January 2023 and iron saturation 12 in nov 2022. ? ?Patient accompanied by his wife who helps provide history. She states that patient has history of anemia in the past. She states that Dr. Theador Hawthorne was concerned about blood loss. Hx of celiac disease and attempts to follow a gluten free diet. Had iron infusion on 12/8 and 12/15. Has not required any recent blood transfusions.baseline hgb appears to be in 10-11 range with drop to 9.5 in nov 2022 with iron studies  with TIBC 347, Iron 40, and saturation 12%, Ferritin done after iron infusions 10/17/21 was 377, hgb at that time was 10.3 with MVC 92, iron studies  with low TIBC at 239, Iron 72 and iron sat 30%. Notably hgb was 10 on 3/20, with iron panel WNL with TIBC 262, Iron 49 and Iron sat 19%. Is not on any oral iron. No further iron infusions since December. No FOBT done. Denies rectal bleeding or melena.  Does have history of NSAID induced ulcer/GI mucosal injury?, maybe 10-12 years ago. He does not take NSAIDs anymore but does use topical voltaren only as needed. No early satiety, post prandial abdominal pain, bloating, nausea or vomiting. Patient denies fatigue, dizziness, or SOB. Has some rumbling in his abdomen though no abdominal pain, will have some occasional diarrhea, maybe once per week. Usually diarrhea is watery but only has 1-2 episodes when  this occurs. He takes OTC medication for this which helps. Denies any unintentional weight loss. Has lost some weight at recommendation of his nephrologist.  ? ?BP 121/40, baseline is 161W-960A, diastolic is typically around 50. Patient denies dizziness or other symptoms. States BP was of similar range at Dr. Lesia Hausen last week, however, they were not concerned.  ?Notably is on metoprolol '25mg'$  ? ?NSAID [id number] voltaren PRN, no oral NSAIDs ?Social hx: no etoh or tobacco ?Fam hx:no CRC or liver disease though father died  at an early age ? ?Last Colonoscopy:04/27/08 mild sigmoid diverticulosis ?Last Endoscopy:04/27/08 celiac disease with abnormal duodenal mucosa (chronic duodenitis with partial villous atrophy), diminutive proximal gastric polyps, benign fundic gland, no H pylori, mild erythema of antrum, non specific, otherwise normal ? ? ?Past Medical History:  ?Diagnosis Date  ? Allergic rhinitis   ? Aortic stenosis   ? Asthmatic bronchitis   ? Celiac disease   ? Cellulitis   ? CKD (chronic kidney disease)   ? Diverticulosis   ? Fx. left wrist 1987  ? Gastric polyps   ? GERD (gastroesophageal reflux disease)   ? History of pneumonia   ? Hypertension   ? Hypothyroidism   ? Iron deficiency anemia   ? Melanoma ([place])   ? Neuropathy   ? PAF (paroxysmal atrial fibrillation) ([place])   ? Prostate cancer ([place])   ? REM sleep behavior disorder   ? RLS (restless legs syndrome)   ?  Sleep apnea   ? Spasticity   ? Stroke [facility]) 09/2017  ? Tremor   ? Type 2 diabetes mellitus ([place])   ? ? ?Past Surgical History:  ?Procedure Laterality Date  ? APPENDECTOMY  1978  ? CARPAL TUNNEL RELEASE Left   ? CATARACT EXTRACTION  03/2011  ? bilateral   ? CHOLECYSTECTOMY  2001  ? COLONOSCOPY W/ BIOPSIES  04/27/2008  ? diverticulosis, duodenitis  ? FOOT SURGERY    ? HERNIA REPAIR  1994  ? bilateral  ? KNEE SURGERY Bilateral   ? x 2  ? LUNG SURGERY  2001  ? for infection  ? PROSTATECTOMY  2002  ? UPPER GASTROINTESTINAL ENDOSCOPY  04/27/2008  ? celiac  disease, gastric polyps  ? ? ?Current Outpatient Medications  ?Medication Sig Dispense Refill  ? acetaminophen (TYLENOL) 325 MG tablet Take 650 mg by mouth every 6 (six) hours as needed. Takes 2 once daily    ? amitriptyline (ELAVIL) 25 MG tablet Take 25 mg by mouth at bedtime.    ? apixaban (ELIQUIS) 2.5 MG TABS tablet Take 1 tablet (2.5 mg total) by mouth 2 (two) times daily. 60 tablet 6  ? Ascorbic Acid (VITAMIN C) 500 MG CAPS Take 500 mg by mouth daily.    ? atorvastatin (LIPITOR) 20 MG tablet Take 20 mg by mouth daily.    ? azelastine (ASTELIN) 0.1 % nasal spray Place 1 spray into both nostrils at bedtime.     ? calcitRIOL (ROCALTROL) 0.25 MCG capsule Take 0.25 mcg by mouth daily.    ? Cholecalciferol (VITAMIN D3) 1000 UNITS CAPS Take 1,000 Units by mouth daily.    ? Collagenase (SANTYL EX) Apply topically. 2 days a week at wound care    ? diclofenac sodium (VOLTAREN) 1 % GEL Apply 2 g topically 4 (four) times daily. 1 Tube 0  ? doxycycline (VIBRA-TABS) 100 MG tablet Take 100 mg by mouth daily.    ? empagliflozin (JARDIANCE) 10 MG TABS tablet Take 10 mg by mouth daily.    ? fluticasone (FLONASE) 50 MCG/ACT nasal spray Place 1 spray into both nostrils daily.     ? furosemide (LASIX) 20 MG tablet Take by mouth. '80mg'$  in the morning, '60mg'$  in the afternnon    ? Ginger, Zingiber officinalis, (GINGER ROOT) 550 MG CAPS Take by mouth. 2 daily    ? glipiZIDE (GLUCOTROL XL) 10 MG 24 hr tablet 10 mg. One bid    ? JANUVIA 100 MG tablet 100 mg daily. morning    ? levocetirizine (XYZAL) 5 MG tablet Take 5 mg by mouth at bedtime.     ? levothyroxine (SYNTHROID, LEVOTHROID) 150 MCG tablet Take 1 tablet (150 mcg total) by mouth daily before breakfast. 30 tablet 0  ? MAGNESIUM MALATE PO Take 400 mg by mouth. One every other day    ? Melatonin 3 MG TABS Take 3 mg by mouth. 2 qhs    ? metoprolol tartrate (LOPRESSOR) 25 MG tablet TAKE 2 TABLETS BY MOUTH TWICE DAILY. 240 tablet 3  ? Misc Natural Products (TART CHERRY ADVANCED PO)  Take 425 mg by mouth. 2 daily    ? Omega-3 Fatty Acids (FISH OIL) 1000 MG CAPS Take by mouth daily.    ? OVER THE COUNTER MEDICATION Gaba - gamma - aminobotyric acid 125 mg one daily at bedtime    ? OVER THE COUNTER MEDICATION Digestive enzymes as needed.    ? pantoprazole (PROTONIX) 40 MG tablet Take 1 tablet (40 mg total) by  mouth daily. 30 tablet 0  ? polyethylene glycol (MIRALAX / GLYCOLAX) 17 g packet Take 17 g by mouth as needed.    ? Probiotic Product (FLORAJEN DIGESTION PO) Take by mouth. 350 mg one daily    ? sevelamer carbonate (RENVELA) 800 MG tablet Take 800 mg by mouth. One with evening meal    ? tiZANidine (ZANAFLEX) 2 MG tablet Take one every 6 hours prn spasms    ? traMADol (ULTRAM) 50 MG tablet Take by mouth. Take one every 8 hour prn pain    ? ?No current facility-administered medications for this visit.  ? ? ?Allergies as of 01/09/2022 - Review Complete 01/09/2022  ?Allergen Reaction Noted  ? Clindamycin Diarrhea 01/17/2016  ? Gluten meal Diarrhea 10/03/2017  ? Hydrocodone Other (See Comments) 03/19/2012  ? Penicillins Other (See Comments)   ? Procaine Other (See Comments) 01/17/2016  ? Sulfonamide derivatives Hives   ? Tape Other (See Comments) 10/03/2017  ? Wheat bran Diarrhea 10/03/2017  ? Cephalexin Diarrhea and Nausea And Vomiting   ? Mold extract [trichophyton] Other (See Comments) 02/14/2015  ? ? ?Family History  ?Problem Relation Age of Onset  ? Breast cancer Mother   ? Kidney failure Father   ? Heart disease Father   ? Rheumatic fever Father   ? Colon cancer Other   ? ? ?Social History  ? ?Socioeconomic History  ? Marital status: Married  ?  Spouse name: [name]  ? Number of children: 3  ? Years of education: college degree  ? Highest education level: Bachelor's degree (e.g., BA, AB, BS)  ?Occupational History  ? Occupation: Retired  ?  Comment: [employer]  ?Tobacco Use  ? Smoking status: Never  ?  Passive exposure: Never  ? Smokeless tobacco: Never  ?Vaping Use  ? Vaping Use: Never used   ?Substance and Sexual Activity  ? Alcohol use: Never  ? Drug use: Never  ? Sexual activity: Never  ?  Birth control/protection: Abstinence  ?Other Topics Concern  ? Not on file  ?Social History Narrative  ?

## 2022-01-10 ENCOUNTER — Ambulatory Visit (HOSPITAL_COMMUNITY): Payer: PPO | Admitting: Physical Therapy

## 2022-01-10 ENCOUNTER — Encounter (HOSPITAL_COMMUNITY): Payer: Self-pay | Admitting: Physical Therapy

## 2022-01-10 DIAGNOSIS — L97811 Non-pressure chronic ulcer of other part of right lower leg limited to breakdown of skin: Secondary | ICD-10-CM

## 2022-01-10 DIAGNOSIS — R2689 Other abnormalities of gait and mobility: Secondary | ICD-10-CM | POA: Diagnosis not present

## 2022-01-10 DIAGNOSIS — E119 Type 2 diabetes mellitus without complications: Secondary | ICD-10-CM | POA: Diagnosis not present

## 2022-01-10 DIAGNOSIS — I10 Essential (primary) hypertension: Secondary | ICD-10-CM | POA: Diagnosis not present

## 2022-01-10 NOTE — Therapy (Signed)
[facility] ?[address] ?[place], [place], [address] ?Phone: [phone]   Fax:  [phone] ? ?Wound Care Therapy ? ?Patient Details  ?Name: [name] ?MRN: [id number] ?Date of Birth: 10-08-1929 ?Referring Provider (PT): [name] ? ? ?Encounter Date: 01/10/2022 ? ? PT End of Session - 01/10/22 1548   ? ? Visit Number [id number]   ? Number of Visits 138   ? Date for PT Re-Evaluation 03/07/22   ? Authorization Type [employer] (no auth req, no visit limit)   ? Progress Note Due on Visit 132   ? PT Start Time 3149   ? PT Stop Time 1530   ? PT Time Calculation (min) 37 min   ? Activity Tolerance Patient tolerated treatment well   ? Behavior During Therapy [facility] for tasks assessed/performed   ? ?  ?  ? ?  ? ? ?Past Medical History:  ?Diagnosis Date  ? Allergic rhinitis   ? Aortic stenosis   ? Asthmatic bronchitis   ? Celiac disease   ? Cellulitis   ? CKD (chronic kidney disease)   ? Diverticulosis   ? Fx. left wrist 1987  ? Gastric polyps   ? GERD (gastroesophageal reflux disease)   ? History of pneumonia   ? Hypertension   ? Hypothyroidism   ? Iron deficiency anemia   ? Melanoma ([place])   ? Neuropathy   ? PAF (paroxysmal atrial fibrillation) ([place])   ? Prostate cancer ([place])   ? REM sleep behavior disorder   ? RLS (restless legs syndrome)   ? Sleep apnea   ? Spasticity   ? Stroke [facility]) 09/2017  ? Tremor   ? Type 2 diabetes mellitus ([place])   ? ? ?Past Surgical History:  ?Procedure Laterality Date  ? APPENDECTOMY  1978  ? CARPAL TUNNEL RELEASE Left   ? CATARACT EXTRACTION  03/2011  ? bilateral   ? CHOLECYSTECTOMY  2001  ? COLONOSCOPY W/ BIOPSIES  04/27/2008  ? diverticulosis, duodenitis  ? FOOT SURGERY    ? HERNIA REPAIR  1994  ? bilateral  ? KNEE SURGERY Bilateral   ? x 2  ? LUNG SURGERY  2001  ? for infection  ? PROSTATECTOMY  2002  ? UPPER GASTROINTESTINAL ENDOSCOPY  04/27/2008  ? celiac disease, gastric polyps  ? ? ?There were no vitals filed for this  visit. ? ? ? ? ? ? ? ? ? ? ? ? ? ? Wound Therapy - 01/10/22 0001   ? ? Subjective PT states no pain today   ? Patient and Family Stated Goals wound to heal   ? Date of Onset 09/06/20   ? Prior Treatments MD and self care   ? Pain Scale 0-10   ? Pain Score 0-No pain   ? Evaluation and Treatment Procedures Explained to Patient/Family Yes   ? Evaluation and Treatment Procedures agreed to   ? Wound Properties Date First Assessed: 04/26/21 Time First Assessed: 7026 Wound Type: Non-pressure wound Location: Leg Location Orientation: Right Wound Description (Comments): superior to original wound Present on Admission: No  ? Dressing Type Santyl;Compression wrap   Santyl in 2x2, lymph liner, profore lite, with ABD pad for shape  ? Dressing Status Old drainage   ? Dressing Change Frequency PRN   ? Site / Wound Assessment Yellow;Red   ? % Wound base Red or Granulating 65%   ? % Wound base Yellow/Fibrinous Exudate 35%   ? Margins Attached edges (approximated)   ?  Drainage Amount Minimal   ? Drainage Description Serous   ? Treatment Cleansed;Debridement (Selective)   ? Wound Properties Date First Assessed: 12/04/20 Time First Assessed: 6195 Wound Type: Venous stasis ulcer Location: Leg Location Orientation: Right Present on Admission: Yes  ? Dressing Type Santyl;Compression wrap   Santyl in 2x2, lymph liner, profore lite, with ABD pad for shape  ? Dressing Status Old drainage   ? Dressing Change Frequency PRN   ? Site / Wound Assessment Pink;Red   ? % Wound base Red or Granulating 70%   ? % Wound base Yellow/Fibrinous Exudate 30%   ? Margins Attached edges (approximated)   ? Drainage Amount Minimal   ? Drainage Description Serous   ? Treatment Cleansed;Debridement (Selective);Other (Comment)   Laser to wound at ant/medial/post and lateral aspect at 60J/cm2 1:24 minutes each area  ? Selective Debridement - Location B wound beds   ? Selective Debridement - Tools Used Forceps;Scalpel   ? Selective Debridement - Tissue Removed slough    ? Wound Therapy - Clinical Statement Therapist began trial of laser using chonic circulation continuous for lower wound only.  Wounds are healing but remain very slow therefore Laser trial to see if healing is excelerated.   ? Wound Therapy - Functional Problem List gait and balance   ? Factors Delaying/Impairing Wound Healing Diabetes Mellitus;Vascular compromise;Altered sensation   ? Hydrotherapy Plan --   Trial laser for wound healing when machine returns.  ? Wound Therapy - Frequency 2X / week   ? Wound Plan Continue with laswer for inferior wound x 4 placements surrounding wound but not on wound itself   ? Dressing  cleanse with wet gauze,vaseline,  lotion, santyl to wounds, 2x2, thicker sockinette followed by  profore lite, #5 netting   ? ?  ?  ? ?  ? ? ? ? ? ? ? ? ? ? ? ? PT Short Term Goals - 01/10/22 1555   ? ?  ? PT SHORT TERM GOAL #1  ? Title Patient will have at least 50% granualtion tissue.   ? Status Achieved   ?  ? PT SHORT TERM GOAL #2  ? Title Pt pain to be decreased in both hips to no greater than a 6/10 to allow pt to be able to walk with his walker for 10 mintues    ? Status Achieved   ? ?  ?  ? ?  ? ? ? ? PT Long Term Goals - 01/10/22 1555   ? ?  ? PT LONG TERM GOAL #1  ? Title Patient's wound will be healed to reduce the risk of infection.   ? Status On-going   ?  ? PT LONG TERM GOAL #2  ? Title Patient will be able to verbalize completion of daily skin checks to reduce the risk of further wound development.   ? Status On-going   ? ?  ?  ? ?  ? ? ? ? ? ? ? ? Plan - 01/10/22 1555   ? ? Personal Factors and Comorbidities Age;Comorbidity 3+;Past/Current Experience;Fitness;Time since onset of injury/illness/exacerbation   ? Comorbidities DM, CKD, neuorpathy, Hx CVA   ? Examination-Activity Limitations Locomotion Level;Transfers;Squat;Stand;Dressing   ? Examination-Participation Restrictions Community Activity   ? Stability/Clinical Decision Making Stable/Uncomplicated   ? Rehab Potential Good   ?  PT Frequency 2x / week   ? PT Duration 6 weeks   ? PT Treatment/Interventions Gait training;Stair training;Functional mobility training;DME Instruction;Therapeutic activities;Therapeutic exercise;Balance training;Neuromuscular re-education;Patient/family education;Compression bandaging;Manual  lymph drainage;Manual techniques   ? PT Next Visit Plan Continue with both chemical and mechanical debridement to remove adherent eschar to allow healing to occur   ? Consulted and Agree with Plan of Care Patient;Family member/caregiver   ? Family Member Consulted wife   ? ?  ?  ? ?  ? ? ?Patient will benefit from skilled therapeutic intervention in order to improve the following deficits and impairments:  Abnormal gait, Decreased skin integrity, Decreased mobility, Impaired sensation ? ?Visit Diagnosis: ?Other abnormalities of gait and mobility ? ?Ulcer of right pretibial region, limited to breakdown of skin ([place]) ? ? ? ? ?Problem List ?Patient Active Problem List  ? Diagnosis Date Noted  ? Advanced nonexudative age-related macular degeneration of right eye without subfoveal involvement 11/15/2020  ? Advanced nonexudative age-related macular degeneration of left eye without subfoveal involvement 11/15/2020  ? Diabetes mellitus without complication ([place]) 87/86/7672  ? Posterior vitreous detachment of both eyes 11/15/2020  ? Gait disturbance, post-stroke 11/02/2017  ? Aphasia as late effect of cerebrovascular accident 11/02/2017  ? 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 Aphasia   ? PAF (paroxysmal atrial fibrillation) ([place])   ? Diabetes mellitus type 2 in obese [facility])   ? Benign essential HTN   ? Hypothyroidism   ? Hyperlipidemia   ? Stroke (cerebrum) ([place]) 10/02/2017  ? Afib ([place]) 01/10/2017  ? SOB (shortness of breath) 01/10/2017  ? Acute diastolic CHF (congestive heart failure) ([place]) 01/10/2017  ? Atrial fibrillation with RVR ([place]) 01/10/2017  ? Claudication ([place]) 07/25/2016  ? Paroxysmal atrial fibrillation  ([place]) 07/25/2016  ? Odynophagia 04/10/2014  ? Allergic rhinitis 03/23/2014  ? Chronic rhinitis 12/22/2013  ? Intrinsic asthma 07/15/2013  ? Constipation 08/22/2011  ? Obesity 09/20/2009  ? G E R D 11/29/2007  ? Celiac diseas

## 2022-01-13 DIAGNOSIS — D649 Anemia, unspecified: Secondary | ICD-10-CM | POA: Insufficient documentation

## 2022-01-14 ENCOUNTER — Ambulatory Visit (HOSPITAL_COMMUNITY): Payer: PPO | Attending: Internal Medicine

## 2022-01-14 ENCOUNTER — Encounter (HOSPITAL_COMMUNITY): Payer: Self-pay

## 2022-01-14 DIAGNOSIS — L97811 Non-pressure chronic ulcer of other part of right lower leg limited to breakdown of skin: Secondary | ICD-10-CM | POA: Insufficient documentation

## 2022-01-14 DIAGNOSIS — R2689 Other abnormalities of gait and mobility: Secondary | ICD-10-CM | POA: Diagnosis not present

## 2022-01-14 NOTE — Therapy (Signed)
[facility] ?[address] ?[place], [place], [address] ?Phone: [phone]   Fax:  [phone] ? ?Wound Care Therapy ? ?Patient Details  ?Name: [name] ?MRN: [id number] ?Date of Birth: 03/09/1929 ?Referring Provider (PT): [name] ? ? ?Encounter Date: 01/14/2022 ? ? PT End of Session - 01/14/22 1557   ? ? Visit Number 109   ? Number of Visits 138   ? Date for PT Re-Evaluation 03/07/22   ? Authorization Type [employer] (no auth req, no visit limit)   ? Progress Note Due on Visit 132   ? PT Start Time 1440   ? PT Stop Time 1530   ? PT Time Calculation (min) 50 min   ? Activity Tolerance Patient tolerated treatment well   ? Behavior During Therapy [facility] for tasks assessed/performed   ? ?  ?  ? ?  ? ? ?Past Medical History:  ?Diagnosis Date  ? Allergic rhinitis   ? Aortic stenosis   ? Asthmatic bronchitis   ? Celiac disease   ? Cellulitis   ? CKD (chronic kidney disease)   ? Diverticulosis   ? Fx. left wrist 1987  ? Gastric polyps   ? GERD (gastroesophageal reflux disease)   ? History of pneumonia   ? Hypertension   ? Hypothyroidism   ? Iron deficiency anemia   ? Melanoma ([place])   ? Neuropathy   ? PAF (paroxysmal atrial fibrillation) ([place])   ? Prostate cancer ([place])   ? REM sleep behavior disorder   ? RLS (restless legs syndrome)   ? Sleep apnea   ? Spasticity   ? Stroke [facility]) 09/2017  ? Tremor   ? Type 2 diabetes mellitus ([place])   ? ? ?Past Surgical History:  ?Procedure Laterality Date  ? APPENDECTOMY  1978  ? CARPAL TUNNEL RELEASE Left   ? CATARACT EXTRACTION  03/2011  ? bilateral   ? CHOLECYSTECTOMY  2001  ? COLONOSCOPY W/ BIOPSIES  04/27/2008  ? diverticulosis, duodenitis  ? FOOT SURGERY    ? HERNIA REPAIR  1994  ? bilateral  ? KNEE SURGERY Bilateral   ? x 2  ? LUNG SURGERY  2001  ? for infection  ? PROSTATECTOMY  2002  ? UPPER GASTROINTESTINAL ENDOSCOPY  04/27/2008  ? celiac disease, gastric polyps  ? ? ?There were no vitals filed for this visit. ? ? ? Subjective  Assessment - 01/14/22 1546   ? ? Subjective Pt stated no pain, arrived with dressings intact.   ? Currently in Pain? No/denies   ? ?  ?  ? ?  ? ? ? ? ? ? ? ? ? ? ? ? Wound Therapy - 01/14/22 0001   ? ? Subjective Pt stated no pain, arrived with dressings intact.   ? Patient and Family Stated Goals wound to heal   ? Date of Onset 09/06/20   ? Prior Treatments MD and self care   ? Pain Scale 0-10   ? Pain Score 0-No pain   ? Evaluation and Treatment Procedures Explained to Patient/Family Yes   ? Evaluation and Treatment Procedures agreed to   ? Wound Properties Date First Assessed: 04/26/21 Time First Assessed: 3235 Wound Type: Non-pressure wound Location: Leg Location Orientation: Right Wound Description (Comments): superior to original wound Present on Admission: No  ? Wound Image Images linked: 1   ? Dressing Type Santyl;Compression wrap   vaseline, Santyl on 2x2; lymph liner, ABD Pad, profore lite, liner #7  ?  Dressing Status Old drainage   ? Dressing Change Frequency PRN   ? Site / Wound Assessment Yellow;Red   ? % Wound base Red or Granulating 65%   ? % Wound base Yellow/Fibrinous Exudate 35%   ? Wound Length (cm) 2.2 cm   ? Wound Width (cm) 1.7 cm   ? Wound Depth (cm) 0.2 cm   ? Wound Volume (cm^3) 0.75 cm^3   ? Wound Surface Area (cm^2) 3.74 cm^2   ? Margins Attached edges (approximated)   ? Drainage Amount Minimal   ? Drainage Description Serous   ? Treatment Cleansed;Debridement (Selective);Other (Comment)   Laser to wound at ant/medial/post and lateral aspect at 60J/cm2 1:24 minutes each area  ? Wound Properties Date First Assessed: 12/04/20 Time First Assessed: 3016 Wound Type: Venous stasis ulcer Location: Leg Location Orientation: Right Present on Admission: Yes  ? Wound Image Images linked: 1   ? Dressing Type Santyl;Compression wrap   vaseline, Santyl on 2x2; lymph liner, ABD Pad, profore lite, liner #7  ? Dressing Changed Changed   ? Dressing Status Old drainage   ? Dressing Change Frequency PRN   ?  Site / Wound Assessment Pink;Red   ? % Wound base Red or Granulating 90%   following debridment, was 100% prior  ? % Wound base Yellow/Fibrinous Exudate 10%   ? Wound Length (cm) 4.5 cm   ? Wound Width (cm) 2 cm   ? Wound Surface Area (cm^2) 9 cm^2   ? Margins Attached edges (approximated)   ? Drainage Amount Minimal   ? Drainage Description Serous   ? Treatment Cleansed;Debridement (Selective);Other (Comment)   Laser to wound at ant/medial/post and lateral aspect at 60J/cm2 1:24 minutes each area  ? Selective Debridement - Location B wound beds   ? Selective Debridement - Tools Used Forceps;Scalpel   ? Selective Debridement - Tissue Removed slough   ? Wound Therapy - Clinical Statement Increased ease with removal of slough from wound bed with inferior wound 90% granulated prior debridment.  Continued wiht laser therapy to improved circulation for wound growth. Pt with increased tremers today, reports he feels they are increasing.  No reoprts of pain through session.   ? Wound Therapy - Functional Problem List gait and balance   ? Factors Delaying/Impairing Wound Healing Diabetes Mellitus;Vascular compromise;Altered sensation   ? Wound Therapy - Frequency 2X / week   ? Wound Plan Continue with laswer for inferior wound x 4 placements surrounding wound but not on wound itself   ? Dressing  cleanse with wet gauze,vaseline,  lotion, santyl to wounds, 2x2, thicker sockinette followed by  profore lite, #5 netting   ? Modality Laser to wound at ant/medial/post and lateral aspect at 60J/cm2 1:24 minutes each area   ? ?  ?  ? ?  ? ? ? ? ? ? ? ? ? ? ? ? PT Short Term Goals - 01/10/22 1555   ? ?  ? PT SHORT TERM GOAL #1  ? Title Patient will have at least 50% granualtion tissue.   ? Status Achieved   ?  ? PT SHORT TERM GOAL #2  ? Title Pt pain to be decreased in both hips to no greater than a 6/10 to allow pt to be able to walk with his walker for 10 mintues    ? Status Achieved   ? ?  ?  ? ?  ? ? ? ? PT Long Term Goals -  01/10/22 1555   ? ?  ?  PT LONG TERM GOAL #1  ? Title Patient's wound will be healed to reduce the risk of infection.   ? Status On-going   ?  ? PT LONG TERM GOAL #2  ? Title Patient will be able to verbalize completion of daily skin checks to reduce the risk of further wound development.   ? Status On-going   ? ?  ?  ? ?  ? ? ? ? ? ? ? ? ? ?Patient will benefit from skilled therapeutic intervention in order to improve the following deficits and impairments:    ? ?Visit Diagnosis: ?Other abnormalities of gait and mobility ? ?Ulcer of right pretibial region, limited to breakdown of skin ([place]) ? ? ? ? ?Problem List ?Patient Active Problem List  ? Diagnosis Date Noted  ? Absolute anemia 01/13/2022  ? Advanced nonexudative age-related macular degeneration of right eye without subfoveal involvement 11/15/2020  ? Advanced nonexudative age-related macular degeneration of left eye without subfoveal involvement 11/15/2020  ? Diabetes mellitus without complication ([place]) 93/71/6967  ? Posterior vitreous detachment of both eyes 11/15/2020  ? Gait disturbance, post-stroke 11/02/2017  ? Aphasia as late effect of cerebrovascular accident 11/02/2017  ? 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 Aphasia   ? PAF (paroxysmal atrial fibrillation) ([place])   ? Diabetes mellitus type 2 in obese [facility])   ? Benign essential HTN   ? Hypothyroidism   ? Hyperlipidemia   ? Stroke (cerebrum) ([place]) 10/02/2017  ? Afib ([place]) 01/10/2017  ? SOB (shortness of breath) 01/10/2017  ? Acute diastolic CHF (congestive heart failure) ([place]) 01/10/2017  ? Atrial fibrillation with RVR ([place]) 01/10/2017  ? Claudication ([place]) 07/25/2016  ? Paroxysmal atrial fibrillation ([place]) 07/25/2016  ? Odynophagia 04/10/2014  ? Allergic rhinitis 03/23/2014  ? Chronic rhinitis 12/22/2013  ? Intrinsic asthma 07/15/2013  ? Constipation 08/22/2011  ? Obesity 09/20/2009  ? G E R D 11/29/2007  ? Celiac disease 11/29/2007  ? ?[name], LPTA/CLT;  CBIS ?[phone] ? ?[name], PTA ?01/14/2022, 4:00 PM ? ?[place] [facility] ?[address] ?[place], [place], [address] ?Phone: [phone]   Fax:  [phone] ? ?Name: [name]

## 2022-01-15 ENCOUNTER — Other Ambulatory Visit: Payer: PPO

## 2022-01-16 ENCOUNTER — Emergency Department (HOSPITAL_COMMUNITY)
Admission: EM | Admit: 2022-01-16 | Discharge: 2022-02-10 | Disposition: E | Payer: PPO | Attending: Student | Admitting: Student

## 2022-01-16 ENCOUNTER — Other Ambulatory Visit: Payer: Self-pay

## 2022-01-16 DIAGNOSIS — Z7901 Long term (current) use of anticoagulants: Secondary | ICD-10-CM | POA: Insufficient documentation

## 2022-01-16 DIAGNOSIS — Z794 Long term (current) use of insulin: Secondary | ICD-10-CM | POA: Diagnosis not present

## 2022-01-16 DIAGNOSIS — Z79899 Other long term (current) drug therapy: Secondary | ICD-10-CM | POA: Diagnosis not present

## 2022-01-16 DIAGNOSIS — Z7984 Long term (current) use of oral hypoglycemic drugs: Secondary | ICD-10-CM | POA: Diagnosis not present

## 2022-01-16 DIAGNOSIS — I469 Cardiac arrest, cause unspecified: Secondary | ICD-10-CM | POA: Diagnosis not present

## 2022-01-16 DIAGNOSIS — I5031 Acute diastolic (congestive) heart failure: Secondary | ICD-10-CM | POA: Insufficient documentation

## 2022-01-16 DIAGNOSIS — R0689 Other abnormalities of breathing: Secondary | ICD-10-CM | POA: Diagnosis not present

## 2022-01-16 DIAGNOSIS — R0902 Hypoxemia: Secondary | ICD-10-CM | POA: Diagnosis not present

## 2022-01-16 DIAGNOSIS — E1122 Type 2 diabetes mellitus with diabetic chronic kidney disease: Secondary | ICD-10-CM | POA: Insufficient documentation

## 2022-01-16 DIAGNOSIS — E039 Hypothyroidism, unspecified: Secondary | ICD-10-CM | POA: Diagnosis not present

## 2022-01-16 DIAGNOSIS — Z8546 Personal history of malignant neoplasm of prostate: Secondary | ICD-10-CM | POA: Diagnosis not present

## 2022-01-16 DIAGNOSIS — I130 Hypertensive heart and chronic kidney disease with heart failure and stage 1 through stage 4 chronic kidney disease, or unspecified chronic kidney disease: Secondary | ICD-10-CM | POA: Diagnosis not present

## 2022-01-16 DIAGNOSIS — I480 Paroxysmal atrial fibrillation: Secondary | ICD-10-CM | POA: Insufficient documentation

## 2022-01-16 DIAGNOSIS — N189 Chronic kidney disease, unspecified: Secondary | ICD-10-CM | POA: Insufficient documentation

## 2022-01-16 DIAGNOSIS — R404 Transient alteration of awareness: Secondary | ICD-10-CM | POA: Diagnosis not present

## 2022-01-16 DIAGNOSIS — R4020 Unspecified coma: Secondary | ICD-10-CM | POA: Diagnosis not present

## 2022-01-16 MED ORDER — EPINEPHRINE 1 MG/10ML IJ SOSY
PREFILLED_SYRINGE | INTRAMUSCULAR | Status: AC | PRN
  Administered 2022-01-16 (×2): 1 mg via INTRAVENOUS

## 2022-01-16 NOTE — Code Documentation (Signed)
Patient time of death occurred at 01/17/2318. ?

## 2022-01-16 NOTE — ED Triage Notes (Signed)
Pt arrived to er by ems with cpr in progress, according to ems pt was a witnessed arrest by family, cpr was in progress by fire department upon ems arrival,  pt was given between 8-10 doses of Epi by ems, a amp of sodium bicarbonate, 2 mg narcan all iv, pt was shocked 3 times due to vtach, given 300 mg amiodarone IV, pt remained in asystole after last shock, blood sugar with ems was 160,  ?Dr Matilde Sprang was at bedside upon pt arrival to er,  ?

## 2022-01-17 ENCOUNTER — Ambulatory Visit (HOSPITAL_COMMUNITY): Payer: PPO | Admitting: Physical Therapy

## 2022-01-21 ENCOUNTER — Ambulatory Visit (HOSPITAL_COMMUNITY): Payer: PPO | Admitting: Physical Therapy

## 2022-01-24 ENCOUNTER — Ambulatory Visit (HOSPITAL_COMMUNITY): Payer: PPO

## 2022-01-28 ENCOUNTER — Ambulatory Visit (HOSPITAL_COMMUNITY): Payer: PPO

## 2022-01-31 ENCOUNTER — Ambulatory Visit (HOSPITAL_COMMUNITY): Payer: PPO | Admitting: Physical Therapy

## 2022-02-04 ENCOUNTER — Ambulatory Visit (HOSPITAL_COMMUNITY): Payer: PPO

## 2022-02-07 ENCOUNTER — Ambulatory Visit (HOSPITAL_COMMUNITY): Payer: PPO

## 2022-02-10 NOTE — ED Provider Notes (Signed)
?[facility] ?Provider Note ? ?CSN: [id number] ?Arrival date & time: 01/30/2022 2306 ? ?Chief Complaint(s) ?Cardiac Arrest ? ?HPI ?[name] is a 88 y.o. male with PMH severe aortic stenosis, celiac disease, CKD not on dialysis, hypothyroidism, paroxysmal A-fib, CVA, T2DM who presents emergency department for evaluation of cardiac arrest.  History obtained from EMS who states that the patient had a significant prolonged downtime in the field with at least 10 rounds of CPR with epinephrine.  EMS states that they almost ceased resuscitative efforts in the field but they did obtain return of spontaneous circulation.  Patient had multiple rounds of V-fib and multiple defibrillations in the field as well.  Unfortunately, the patient suffered cardiac arrest in the ambulance and arrives with CPR in progress via [place].  Additional history unable to be obtained due to patient being unresponsive.  Patient arrives intubated in the field with abdominal distention. ? ? ?Cardiac Arrest ? ?Past Medical History ?Past Medical History:  ?Diagnosis Date  ? Allergic rhinitis   ? Aortic stenosis   ? Asthmatic bronchitis   ? Celiac disease   ? Cellulitis   ? CKD (chronic kidney disease)   ? Diverticulosis   ? Fx. left wrist 1987  ? Gastric polyps   ? GERD (gastroesophageal reflux disease)   ? History of pneumonia   ? Hypertension   ? Hypothyroidism   ? Iron deficiency anemia   ? Melanoma ([place])   ? Neuropathy   ? PAF (paroxysmal atrial fibrillation) ([place])   ? Prostate cancer ([place])   ? REM sleep behavior disorder   ? RLS (restless legs syndrome)   ? Sleep apnea   ? Spasticity   ? Stroke [facility]) 09/2017  ? Tremor   ? Type 2 diabetes mellitus ([place])   ? ?Patient Active Problem List  ? Diagnosis Date Noted  ? Absolute anemia 01/13/2022  ? Advanced nonexudative age-related macular degeneration of right eye without subfoveal involvement 11/15/2020  ? Advanced nonexudative age-related macular degeneration of left eye  without subfoveal involvement 11/15/2020  ? Diabetes mellitus without complication ([place]) 62/70/3500  ? Posterior vitreous detachment of both eyes 11/15/2020  ? Gait disturbance, post-stroke 11/02/2017  ? Aphasia as late effect of cerebrovascular accident 11/02/2017  ? 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 Aphasia   ? PAF (paroxysmal atrial fibrillation) ([place])   ? Diabetes mellitus type 2 in obese [facility])   ? Benign essential HTN   ? Hypothyroidism   ? Hyperlipidemia   ? Stroke (cerebrum) ([place]) 10/02/2017  ? Afib ([place]) 01/10/2017  ? SOB (shortness of breath) 01/10/2017  ? Acute diastolic CHF (congestive heart failure) ([place]) 01/10/2017  ? Atrial fibrillation with RVR ([place]) 01/10/2017  ? Claudication ([place]) 07/25/2016  ? Paroxysmal atrial fibrillation ([place]) 07/25/2016  ? Odynophagia 04/10/2014  ? Allergic rhinitis 03/23/2014  ? Chronic rhinitis 12/22/2013  ? Intrinsic asthma 07/15/2013  ? Constipation 08/22/2011  ? Obesity 09/20/2009  ? G E R D 11/29/2007  ? Celiac disease 11/29/2007  ? ?Home Medication(s) ?Prior to Admission medications   ?Medication Sig Start Date End Date Taking? Authorizing Provider  ?acetaminophen (TYLENOL) 325 MG tablet Take 650 mg by mouth every 6 (six) hours as needed. Takes 2 once daily    [provider]  ?amitriptyline (ELAVIL) 25 MG tablet Take 25 mg by mouth at bedtime.    [provider]  ?apixaban (ELIQUIS) 2.5 MG TABS tablet Take 1 tablet (2.5 mg total) by mouth 2 (two)  times daily. 02/22/19   Satira Sark, MD  ?Ascorbic Acid (VITAMIN C) 500 MG CAPS Take 500 mg by mouth daily.    [provider]  ?atorvastatin (LIPITOR) 20 MG tablet Take 20 mg by mouth daily.    [provider]  ?azelastine (ASTELIN) 0.1 % nasal spray Place 1 spray into both nostrils at bedtime.  04/25/14   [provider]  ?calcitRIOL (ROCALTROL) 0.25 MCG capsule Take 0.25 mcg by mouth daily.    [provider]  ?Cholecalciferol (VITAMIN D3) 1000  UNITS CAPS Take 1,000 Units by mouth daily.    [provider]  ?Collagenase (SANTYL EX) Apply topically. 2 days a week at wound care    [provider]  ?diclofenac sodium (VOLTAREN) 1 % GEL Apply 2 g topically 4 (four) times daily. 10/16/17   Angiulli, Lavon Paganini, PA-C  ?doxycycline (VIBRA-TABS) 100 MG tablet Take 100 mg by mouth daily. 12/09/21   [provider]  ?empagliflozin (JARDIANCE) 10 MG TABS tablet Take 10 mg by mouth daily.    [provider]  ?fluticasone (FLONASE) 50 MCG/ACT nasal spray Place 1 spray into both nostrils daily.  07/28/16   [provider]  ?furosemide (LASIX) 20 MG tablet Take by mouth. '80mg'$  in the morning, '60mg'$  in the afternnon    [provider]  ?Ginger, Zingiber officinalis, (GINGER ROOT) 550 MG CAPS Take by mouth. 2 daily    [provider]  ?glipiZIDE (GLUCOTROL XL) 10 MG 24 hr tablet 10 mg. One bid 10/18/19   [provider]  ?JANUVIA 100 MG tablet 100 mg daily. morning 11/27/17   [provider]  ?levocetirizine (XYZAL) 5 MG tablet Take 5 mg by mouth at bedtime.  04/21/14   [provider]  ?levothyroxine (SYNTHROID, LEVOTHROID) 150 MCG tablet Take 1 tablet (150 mcg total) by mouth daily before breakfast. 10/16/17   [place], Lavon Paganini, PA-C  ?MAGNESIUM MALATE PO Take 400 mg by mouth. One every other day    [provider]  ?Melatonin 3 MG TABS Take 3 mg by mouth. 2 qhs    [provider]  ?metoprolol tartrate (LOPRESSOR) 25 MG tablet TAKE 2 TABLETS BY MOUTH TWICE DAILY. 10/09/21   Satira Sark, MD  ?Misc Natural Products (TART CHERRY ADVANCED PO) Take 425 mg by mouth. 2 daily    [provider]  ?Omega-3 Fatty Acids (FISH OIL) 1000 MG CAPS Take by mouth daily.    [provider]  ?OVER THE COUNTER MEDICATION Gaba - gamma - aminobotyric acid 125 mg one daily at bedtime    [provider]  ?[place] Digestive enzymes as needed.     [provider]  ?pantoprazole (PROTONIX) 40 MG tablet Take 1 tablet (40 mg total) by mouth daily. 10/16/17   Angiulli, Lavon Paganini, PA-C  ?polyethylene glycol (MIRALAX / GLYCOLAX) 17 g packet Take 17 g by mouth as needed.    [provider]  ?Probiotic Product (FLORAJEN DIGESTION PO) Take by mouth. 350 mg one daily    [provider]  ?sevelamer carbonate (RENVELA) 800 MG tablet Take 800 mg by mouth. One with evening meal    [provider]  ?tiZANidine (ZANAFLEX) 2 MG tablet Take one every 6 hours prn spasms 09/28/19   [provider]  ?traMADol (ULTRAM) 50 MG tablet Take by mouth. Take one every 8 hour prn pain    [provider]  ?                                                                                                                                  ?  Past Surgical History ?Past Surgical History:  ?Procedure Laterality Date  ? APPENDECTOMY  1978  ? CARPAL TUNNEL RELEASE Left   ? CATARACT EXTRACTION  03/2011  ? bilateral   ? CHOLECYSTECTOMY  2001  ? COLONOSCOPY W/ BIOPSIES  04/27/2008  ? diverticulosis, duodenitis  ? FOOT SURGERY    ? HERNIA REPAIR  1994  ? bilateral  ? KNEE SURGERY Bilateral   ? x 2  ? LUNG SURGERY  2001  ? for infection  ? PROSTATECTOMY  2002  ? UPPER GASTROINTESTINAL ENDOSCOPY  04/27/2008  ? celiac disease, gastric polyps  ? ?Family History ?Family History  ?Problem Relation Age of Onset  ? Breast cancer Mother   ? Kidney failure Father   ? Heart disease Father   ? Rheumatic fever Father   ? Colon cancer Other   ? ? ?Social History ?Social History  ? ?Tobacco Use  ? Smoking status: Never  ?  Passive exposure: Never  ? Smokeless tobacco: Never  ?Vaping Use  ? Vaping Use: Never used  ?Substance Use Topics  ? Alcohol use: Never  ? Drug use: Never  ? ?Allergies ?Clindamycin, Gluten meal, Hydrocodone, Penicillins, Procaine, Sulfonamide derivatives, Tape, Wheat bran, Cephalexin, and Mold extract [trichophyton] ? ?Review of Systems ?Review of  Systems  ?Unable to perform ROS: Patient unresponsive  ? ?Physical Exam ?Vital Signs  ?I have reviewed the triage vital signs ?Pulse (!) 0   Resp (!) 0   SpO2 (!) 0%  ? ?Physical Exam ?Constitutional:   ?   Gen

## 2022-02-10 DEATH — deceased

## 2022-02-11 ENCOUNTER — Ambulatory Visit (HOSPITAL_COMMUNITY): Payer: PPO | Admitting: Physical Therapy

## 2022-02-14 ENCOUNTER — Ambulatory Visit (HOSPITAL_COMMUNITY): Payer: PPO

## 2022-02-18 ENCOUNTER — Ambulatory Visit (HOSPITAL_COMMUNITY): Payer: PPO

## 2022-02-21 ENCOUNTER — Ambulatory Visit (HOSPITAL_COMMUNITY): Payer: PPO

## 2022-02-25 ENCOUNTER — Ambulatory Visit (HOSPITAL_COMMUNITY): Payer: PPO | Admitting: Physical Therapy

## 2022-02-27 ENCOUNTER — Ambulatory Visit (HOSPITAL_COMMUNITY): Payer: PPO | Admitting: Physical Therapy

## 2022-03-04 ENCOUNTER — Ambulatory Visit (HOSPITAL_COMMUNITY): Payer: PPO | Admitting: Physical Therapy

## 2022-03-07 ENCOUNTER — Ambulatory Visit (HOSPITAL_COMMUNITY): Payer: PPO

## 2022-03-11 ENCOUNTER — Ambulatory Visit (HOSPITAL_COMMUNITY): Payer: PPO | Admitting: Physical Therapy

## 2022-03-14 ENCOUNTER — Ambulatory Visit (HOSPITAL_COMMUNITY): Payer: PPO

## 2022-04-14 ENCOUNTER — Ambulatory Visit (INDEPENDENT_AMBULATORY_CARE_PROVIDER_SITE_OTHER): Payer: PPO | Admitting: Gastroenterology

## 2022-05-21 ENCOUNTER — Encounter (INDEPENDENT_AMBULATORY_CARE_PROVIDER_SITE_OTHER): Payer: PPO | Admitting: Ophthalmology

## 2022-05-26 ENCOUNTER — Encounter (INDEPENDENT_AMBULATORY_CARE_PROVIDER_SITE_OTHER): Payer: PPO | Admitting: Ophthalmology

## 2022-06-19 ENCOUNTER — Other Ambulatory Visit: Payer: PPO

## 2022-07-08 ENCOUNTER — Ambulatory Visit: Payer: PPO | Admitting: Cardiology
